# Patient Record
Sex: Female | Born: 1945
Health system: Southern US, Community
[De-identification: ages and names within clinical notes are randomized; demographics above are authoritative.]

## PROBLEM LIST (undated history)

## (undated) DIAGNOSIS — F32A Depression, unspecified: Secondary | ICD-10-CM

## (undated) DIAGNOSIS — I10 Essential (primary) hypertension: Secondary | ICD-10-CM

## (undated) DIAGNOSIS — K589 Irritable bowel syndrome without diarrhea: Secondary | ICD-10-CM

## (undated) DIAGNOSIS — G40909 Epilepsy, unspecified, not intractable, without status epilepticus: Secondary | ICD-10-CM

## (undated) DIAGNOSIS — K219 Gastro-esophageal reflux disease without esophagitis: Secondary | ICD-10-CM

## (undated) DIAGNOSIS — D649 Anemia, unspecified: Secondary | ICD-10-CM

## (undated) DIAGNOSIS — E039 Hypothyroidism, unspecified: Secondary | ICD-10-CM

## (undated) DIAGNOSIS — M109 Gout, unspecified: Secondary | ICD-10-CM

## (undated) DIAGNOSIS — K5520 Angiodysplasia of colon without hemorrhage: Secondary | ICD-10-CM

## (undated) DIAGNOSIS — K760 Fatty (change of) liver, not elsewhere classified: Secondary | ICD-10-CM

## (undated) DIAGNOSIS — M1990 Unspecified osteoarthritis, unspecified site: Secondary | ICD-10-CM

## (undated) DIAGNOSIS — R569 Unspecified convulsions: Secondary | ICD-10-CM

## (undated) DIAGNOSIS — I219 Acute myocardial infarction, unspecified: Secondary | ICD-10-CM

## (undated) DIAGNOSIS — H3581 Retinal edema: Secondary | ICD-10-CM

## (undated) DIAGNOSIS — G2581 Restless legs syndrome: Secondary | ICD-10-CM

## (undated) DIAGNOSIS — K635 Polyp of colon: Secondary | ICD-10-CM

## (undated) DIAGNOSIS — T7840XA Allergy, unspecified, initial encounter: Secondary | ICD-10-CM

## (undated) DIAGNOSIS — K56699 Other intestinal obstruction unspecified as to partial versus complete obstruction: Secondary | ICD-10-CM

## (undated) DIAGNOSIS — Z5189 Encounter for other specified aftercare: Secondary | ICD-10-CM

## (undated) DIAGNOSIS — E785 Hyperlipidemia, unspecified: Secondary | ICD-10-CM

## (undated) DIAGNOSIS — K222 Esophageal obstruction: Secondary | ICD-10-CM

## (undated) DIAGNOSIS — K5790 Diverticulosis of intestine, part unspecified, without perforation or abscess without bleeding: Secondary | ICD-10-CM

## (undated) DIAGNOSIS — R7303 Prediabetes: Secondary | ICD-10-CM

## (undated) DIAGNOSIS — N189 Chronic kidney disease, unspecified: Secondary | ICD-10-CM

## (undated) DIAGNOSIS — D689 Coagulation defect, unspecified: Secondary | ICD-10-CM

## (undated) DIAGNOSIS — C50919 Malignant neoplasm of unspecified site of unspecified female breast: Secondary | ICD-10-CM

## (undated) DIAGNOSIS — Z79890 Hormone replacement therapy (postmenopausal): Secondary | ICD-10-CM

## (undated) DIAGNOSIS — A029 Salmonella infection, unspecified: Secondary | ICD-10-CM

## (undated) DIAGNOSIS — Z780 Asymptomatic menopausal state: Secondary | ICD-10-CM

## (undated) DIAGNOSIS — E538 Deficiency of other specified B group vitamins: Secondary | ICD-10-CM

## (undated) DIAGNOSIS — H269 Unspecified cataract: Secondary | ICD-10-CM

## (undated) DIAGNOSIS — R011 Cardiac murmur, unspecified: Secondary | ICD-10-CM

## (undated) DIAGNOSIS — F419 Anxiety disorder, unspecified: Secondary | ICD-10-CM

## (undated) DIAGNOSIS — K922 Gastrointestinal hemorrhage, unspecified: Secondary | ICD-10-CM

## (undated) DIAGNOSIS — K449 Diaphragmatic hernia without obstruction or gangrene: Secondary | ICD-10-CM

## (undated) DIAGNOSIS — M170 Bilateral primary osteoarthritis of knee: Secondary | ICD-10-CM

## (undated) DIAGNOSIS — K921 Melena: Secondary | ICD-10-CM

## (undated) HISTORY — DX: Irritable bowel syndrome, unspecified: K58.9

## (undated) HISTORY — DX: Prediabetes: R73.03

## (undated) HISTORY — DX: Asymptomatic menopausal state: Z78.0

## (undated) HISTORY — DX: Essential (primary) hypertension: I10

## (undated) HISTORY — PX: TUBAL LIGATION: SHX77

## (undated) HISTORY — DX: Allergy, unspecified, initial encounter: T78.40XA

## (undated) HISTORY — DX: Epilepsy, unspecified, not intractable, without status epilepticus: G40.909

## (undated) HISTORY — DX: Polyp of colon: K63.5

## (undated) HISTORY — DX: Unspecified cataract: H26.9

## (undated) HISTORY — DX: Hormone replacement therapy: Z79.890

## (undated) HISTORY — DX: Coagulation defect, unspecified: D68.9

## (undated) HISTORY — DX: Encounter for other specified aftercare: Z51.89

## (undated) HISTORY — DX: Fatty (change of) liver, not elsewhere classified: K76.0

## (undated) HISTORY — DX: Angiodysplasia of colon without hemorrhage: K55.20

## (undated) HISTORY — DX: Anxiety disorder, unspecified: F41.9

## (undated) HISTORY — DX: Bilateral primary osteoarthritis of knee: M17.0

## (undated) HISTORY — DX: Chronic kidney disease, unspecified: N18.9

## (undated) HISTORY — DX: Hypothyroidism, unspecified: E03.9

## (undated) HISTORY — DX: Gastrointestinal hemorrhage, unspecified: K92.2

## (undated) HISTORY — DX: Deficiency of other specified B group vitamins: E53.8

## (undated) HISTORY — DX: Diverticulosis of intestine, part unspecified, without perforation or abscess without bleeding: K57.90

## (undated) HISTORY — DX: Unspecified osteoarthritis, unspecified site: M19.90

## (undated) HISTORY — DX: Other intestinal obstruction unspecified as to partial versus complete obstruction: K56.699

## (undated) HISTORY — DX: Hyperlipidemia, unspecified: E78.5

## (undated) HISTORY — DX: Gastro-esophageal reflux disease without esophagitis: K21.9

## (undated) HISTORY — DX: Acute myocardial infarction, unspecified: I21.9

## (undated) HISTORY — DX: Malignant neoplasm of unspecified site of unspecified female breast: C50.919

## (undated) HISTORY — DX: Diaphragmatic hernia without obstruction or gangrene: K44.9

## (undated) HISTORY — DX: Depression, unspecified: F32.A

## (undated) HISTORY — DX: Cardiac murmur, unspecified: R01.1

## (undated) HISTORY — PX: HERNIA REPAIR: SHX51

## (undated) HISTORY — DX: Restless legs syndrome: G25.81

## (undated) HISTORY — DX: Melena: K92.1

## (undated) HISTORY — DX: Salmonella infection, unspecified: A02.9

## (undated) HISTORY — PX: GANGLION CYST EXCISION: SHX1691

## (undated) HISTORY — DX: Retinal edema: H35.81

## (undated) HISTORY — DX: Gout, unspecified: M10.9

## (undated) HISTORY — DX: Esophageal obstruction: K22.2

## (undated) HISTORY — PX: COSMETIC SURGERY: SHX468

---

## 1981-03-20 HISTORY — PX: ABDOMINAL HYSTERECTOMY: SHX81

## 1984-03-20 HISTORY — PX: OVARY SURGERY: SHX727

## 2000-01-24 ENCOUNTER — Encounter: Payer: Self-pay | Admitting: Obstetrics and Gynecology

## 2000-01-24 ENCOUNTER — Encounter: Admission: RE | Admit: 2000-01-24 | Discharge: 2000-01-24 | Payer: Self-pay | Admitting: Obstetrics and Gynecology

## 2000-05-17 ENCOUNTER — Encounter: Payer: Self-pay | Admitting: Geriatric Medicine

## 2000-05-17 ENCOUNTER — Inpatient Hospital Stay (HOSPITAL_COMMUNITY): Admission: EM | Admit: 2000-05-17 | Discharge: 2000-05-22 | Payer: Self-pay | Admitting: Emergency Medicine

## 2000-05-18 DIAGNOSIS — A029 Salmonella infection, unspecified: Secondary | ICD-10-CM

## 2000-05-18 HISTORY — DX: Salmonella infection, unspecified: A02.9

## 2001-03-19 ENCOUNTER — Encounter: Admission: RE | Admit: 2001-03-19 | Discharge: 2001-03-19 | Payer: Self-pay | Admitting: Obstetrics and Gynecology

## 2001-03-19 ENCOUNTER — Encounter: Payer: Self-pay | Admitting: Obstetrics and Gynecology

## 2002-03-20 ENCOUNTER — Encounter: Payer: Self-pay | Admitting: Family Medicine

## 2002-03-20 LAB — CONVERTED CEMR LAB

## 2002-07-25 ENCOUNTER — Encounter: Admission: RE | Admit: 2002-07-25 | Discharge: 2002-07-25 | Payer: Self-pay | Admitting: Obstetrics and Gynecology

## 2002-07-25 ENCOUNTER — Encounter: Payer: Self-pay | Admitting: Obstetrics and Gynecology

## 2003-01-14 ENCOUNTER — Encounter: Payer: Self-pay | Admitting: Gastroenterology

## 2003-01-14 DIAGNOSIS — K56699 Other intestinal obstruction unspecified as to partial versus complete obstruction: Secondary | ICD-10-CM | POA: Insufficient documentation

## 2003-01-14 HISTORY — DX: Other intestinal obstruction unspecified as to partial versus complete obstruction: K56.699

## 2003-01-14 LAB — HM COLONOSCOPY

## 2003-01-20 ENCOUNTER — Encounter: Payer: Self-pay | Admitting: Gastroenterology

## 2003-01-20 ENCOUNTER — Ambulatory Visit (HOSPITAL_COMMUNITY): Admission: RE | Admit: 2003-01-20 | Discharge: 2003-01-20 | Payer: Self-pay | Admitting: Gastroenterology

## 2003-06-11 ENCOUNTER — Emergency Department (HOSPITAL_COMMUNITY): Admission: EM | Admit: 2003-06-11 | Discharge: 2003-06-11 | Payer: Self-pay | Admitting: Emergency Medicine

## 2004-01-19 ENCOUNTER — Ambulatory Visit: Payer: Self-pay | Admitting: Family Medicine

## 2004-01-29 ENCOUNTER — Encounter: Admission: RE | Admit: 2004-01-29 | Discharge: 2004-01-29 | Payer: Self-pay | Admitting: Family Medicine

## 2004-02-09 ENCOUNTER — Ambulatory Visit: Payer: Self-pay | Admitting: Family Medicine

## 2004-02-16 ENCOUNTER — Encounter: Admission: RE | Admit: 2004-02-16 | Discharge: 2004-02-21 | Payer: Self-pay | Admitting: Family Medicine

## 2004-03-10 ENCOUNTER — Ambulatory Visit: Payer: Self-pay | Admitting: Family Medicine

## 2004-07-08 ENCOUNTER — Ambulatory Visit: Payer: Self-pay | Admitting: Family Medicine

## 2004-10-19 ENCOUNTER — Emergency Department (HOSPITAL_COMMUNITY): Admission: EM | Admit: 2004-10-19 | Discharge: 2004-10-19 | Payer: Self-pay | Admitting: Emergency Medicine

## 2004-10-20 ENCOUNTER — Ambulatory Visit: Payer: Self-pay | Admitting: Family Medicine

## 2004-11-03 ENCOUNTER — Ambulatory Visit: Payer: Self-pay | Admitting: Family Medicine

## 2004-11-07 ENCOUNTER — Ambulatory Visit: Payer: Self-pay | Admitting: Family Medicine

## 2005-01-18 ENCOUNTER — Ambulatory Visit: Payer: Self-pay | Admitting: Family Medicine

## 2005-05-11 ENCOUNTER — Ambulatory Visit: Payer: Self-pay | Admitting: Family Medicine

## 2005-06-12 ENCOUNTER — Ambulatory Visit: Payer: Self-pay | Admitting: Family Medicine

## 2005-06-19 ENCOUNTER — Encounter: Admission: RE | Admit: 2005-06-19 | Discharge: 2005-06-19 | Payer: Self-pay | Admitting: Obstetrics and Gynecology

## 2005-06-27 ENCOUNTER — Ambulatory Visit: Payer: Self-pay | Admitting: Family Medicine

## 2005-07-04 ENCOUNTER — Encounter: Admission: RE | Admit: 2005-07-04 | Discharge: 2005-07-04 | Payer: Self-pay | Admitting: Obstetrics and Gynecology

## 2005-08-10 ENCOUNTER — Ambulatory Visit: Payer: Self-pay | Admitting: Family Medicine

## 2005-10-04 ENCOUNTER — Ambulatory Visit: Payer: Self-pay | Admitting: Family Medicine

## 2006-02-02 ENCOUNTER — Ambulatory Visit: Payer: Self-pay | Admitting: Family Medicine

## 2006-02-22 ENCOUNTER — Ambulatory Visit: Payer: Self-pay | Admitting: Family Medicine

## 2006-05-11 ENCOUNTER — Ambulatory Visit: Payer: Self-pay | Admitting: Family Medicine

## 2006-07-06 ENCOUNTER — Encounter: Admission: RE | Admit: 2006-07-06 | Discharge: 2006-07-06 | Payer: Self-pay | Admitting: Family Medicine

## 2006-08-10 ENCOUNTER — Ambulatory Visit: Payer: Self-pay | Admitting: Family Medicine

## 2006-08-22 ENCOUNTER — Encounter: Payer: Self-pay | Admitting: Family Medicine

## 2006-08-22 DIAGNOSIS — Z87898 Personal history of other specified conditions: Secondary | ICD-10-CM

## 2006-08-22 DIAGNOSIS — K760 Fatty (change of) liver, not elsewhere classified: Secondary | ICD-10-CM

## 2006-08-22 DIAGNOSIS — E1169 Type 2 diabetes mellitus with other specified complication: Secondary | ICD-10-CM

## 2006-08-22 DIAGNOSIS — K219 Gastro-esophageal reflux disease without esophagitis: Secondary | ICD-10-CM | POA: Insufficient documentation

## 2006-08-22 DIAGNOSIS — J309 Allergic rhinitis, unspecified: Secondary | ICD-10-CM

## 2006-08-22 DIAGNOSIS — E785 Hyperlipidemia, unspecified: Secondary | ICD-10-CM

## 2006-08-22 DIAGNOSIS — I10 Essential (primary) hypertension: Secondary | ICD-10-CM

## 2006-08-22 HISTORY — DX: Essential (primary) hypertension: I10

## 2006-08-22 HISTORY — DX: Allergic rhinitis, unspecified: J30.9

## 2006-08-22 HISTORY — DX: Personal history of other specified conditions: Z87.898

## 2006-08-22 HISTORY — DX: Type 2 diabetes mellitus with other specified complication: E11.69

## 2006-08-23 ENCOUNTER — Ambulatory Visit: Payer: Self-pay | Admitting: Family Medicine

## 2006-08-23 DIAGNOSIS — G478 Other sleep disorders: Secondary | ICD-10-CM | POA: Insufficient documentation

## 2006-08-23 DIAGNOSIS — G5600 Carpal tunnel syndrome, unspecified upper limb: Secondary | ICD-10-CM

## 2006-08-23 DIAGNOSIS — G2581 Restless legs syndrome: Secondary | ICD-10-CM

## 2006-08-23 HISTORY — DX: Restless legs syndrome: G25.81

## 2006-08-23 HISTORY — DX: Carpal tunnel syndrome, unspecified upper limb: G56.00

## 2006-08-24 LAB — CONVERTED CEMR LAB
ALT: 55 units/L — ABNORMAL HIGH (ref 0–40)
AST: 59 units/L — ABNORMAL HIGH (ref 0–37)
Basophils Absolute: 0.1 10*3/uL (ref 0.0–0.1)
Basophils Relative: 0.8 % (ref 0.0–1.0)
HCT: 37.7 % (ref 36.0–46.0)
Hemoglobin: 13.1 g/dL (ref 12.0–15.0)
Lymphocytes Relative: 28.2 % (ref 12.0–46.0)
MCHC: 34.7 g/dL (ref 30.0–36.0)
Monocytes Absolute: 0.5 10*3/uL (ref 0.2–0.7)
Monocytes Relative: 7.5 % (ref 3.0–11.0)
WBC: 6.6 10*3/uL (ref 4.5–10.5)

## 2006-10-29 ENCOUNTER — Telehealth (INDEPENDENT_AMBULATORY_CARE_PROVIDER_SITE_OTHER): Payer: Self-pay | Admitting: *Deleted

## 2006-11-30 ENCOUNTER — Ambulatory Visit: Payer: Self-pay | Admitting: Family Medicine

## 2007-04-15 ENCOUNTER — Ambulatory Visit: Payer: Self-pay | Admitting: Family Medicine

## 2007-04-15 DIAGNOSIS — F411 Generalized anxiety disorder: Secondary | ICD-10-CM

## 2007-04-15 HISTORY — DX: Generalized anxiety disorder: F41.1

## 2007-04-30 ENCOUNTER — Telehealth: Payer: Self-pay | Admitting: Family Medicine

## 2007-05-02 ENCOUNTER — Ambulatory Visit: Payer: Self-pay | Admitting: Family Medicine

## 2007-05-06 LAB — CONVERTED CEMR LAB
ALT: 29 units/L (ref 0–35)
Albumin: 3.7 g/dL (ref 3.5–5.2)
Alkaline Phosphatase: 80 units/L (ref 39–117)
BUN: 20 mg/dL (ref 6–23)
CO2: 27 meq/L (ref 19–32)
Calcium: 9.2 mg/dL (ref 8.4–10.5)
Chloride: 104 meq/L (ref 96–112)
Direct LDL: 113.2 mg/dL
GFR calc Af Amer: 82 mL/min
HDL: 25.7 mg/dL — ABNORMAL LOW (ref >39.0)
Phosphorus: 4.4 mg/dL (ref 2.3–4.6)
Potassium: 4.2 meq/L (ref 3.5–5.1)
Sodium: 140 meq/L (ref 135–145)
VLDL: 45 mg/dL — ABNORMAL HIGH (ref 0–40)

## 2007-05-17 ENCOUNTER — Ambulatory Visit: Payer: Self-pay | Admitting: Family Medicine

## 2007-05-18 ENCOUNTER — Encounter: Payer: Self-pay | Admitting: Family Medicine

## 2007-06-11 ENCOUNTER — Encounter: Payer: Self-pay | Admitting: Family Medicine

## 2007-08-16 ENCOUNTER — Ambulatory Visit: Payer: Self-pay | Admitting: Family Medicine

## 2007-08-18 LAB — CONVERTED CEMR LAB
Hgb A1c MFr Bld: 6.4 % — ABNORMAL HIGH (ref 4.6–6.0)
Triglycerides: 283 mg/dL (ref 0–149)
VLDL: 57 mg/dL — ABNORMAL HIGH (ref 0–40)

## 2007-08-19 ENCOUNTER — Ambulatory Visit: Payer: Self-pay | Admitting: Family Medicine

## 2007-08-19 DIAGNOSIS — E1140 Type 2 diabetes mellitus with diabetic neuropathy, unspecified: Secondary | ICD-10-CM

## 2007-08-19 DIAGNOSIS — E1165 Type 2 diabetes mellitus with hyperglycemia: Secondary | ICD-10-CM

## 2007-08-19 HISTORY — DX: Type 2 diabetes mellitus with diabetic neuropathy, unspecified: E11.40

## 2007-09-26 ENCOUNTER — Telehealth: Payer: Self-pay | Admitting: Family Medicine

## 2007-10-03 ENCOUNTER — Telehealth (INDEPENDENT_AMBULATORY_CARE_PROVIDER_SITE_OTHER): Payer: Self-pay | Admitting: *Deleted

## 2007-11-11 ENCOUNTER — Telehealth (INDEPENDENT_AMBULATORY_CARE_PROVIDER_SITE_OTHER): Payer: Self-pay | Admitting: *Deleted

## 2007-11-29 ENCOUNTER — Ambulatory Visit: Payer: Self-pay | Admitting: Family Medicine

## 2007-11-29 ENCOUNTER — Encounter: Admission: RE | Admit: 2007-11-29 | Discharge: 2007-11-29 | Payer: Self-pay | Admitting: Family Medicine

## 2007-11-29 LAB — CONVERTED CEMR LAB
ALT: 31 units/L (ref 0–35)
Albumin: 4.1 g/dL (ref 3.5–5.2)
CO2: 31 meq/L (ref 19–32)
Calcium: 9.2 mg/dL (ref 8.4–10.5)
Chloride: 104 meq/L (ref 96–112)
Direct LDL: 106.7 mg/dL
Glucose, Bld: 105 mg/dL — ABNORMAL HIGH (ref 70–99)
Hgb A1c MFr Bld: 6 % (ref 4.6–6.0)
Microalb Creat Ratio: 3.1 mg/g (ref 0.0–30.0)
Microalb, Ur: 0.5 mg/dL (ref 0.0–1.9)
Phosphorus: 4.8 mg/dL — ABNORMAL HIGH (ref 2.3–4.6)
Total CHOL/HDL Ratio: 7
Triglycerides: 271 mg/dL (ref 0–149)
VLDL: 54 mg/dL — ABNORMAL HIGH (ref 0–40)

## 2007-12-02 ENCOUNTER — Ambulatory Visit: Payer: Self-pay | Admitting: Family Medicine

## 2007-12-30 ENCOUNTER — Telehealth: Payer: Self-pay | Admitting: Family Medicine

## 2008-01-13 ENCOUNTER — Telehealth: Payer: Self-pay | Admitting: Family Medicine

## 2008-01-19 DIAGNOSIS — K921 Melena: Secondary | ICD-10-CM | POA: Insufficient documentation

## 2008-01-19 HISTORY — DX: Melena: K92.1

## 2008-02-03 ENCOUNTER — Encounter: Payer: Self-pay | Admitting: Family Medicine

## 2008-02-03 ENCOUNTER — Ambulatory Visit: Payer: Self-pay | Admitting: Internal Medicine

## 2008-02-03 ENCOUNTER — Inpatient Hospital Stay (HOSPITAL_COMMUNITY): Admission: EM | Admit: 2008-02-03 | Discharge: 2008-02-05 | Payer: Self-pay | Admitting: Emergency Medicine

## 2008-02-04 ENCOUNTER — Ambulatory Visit: Payer: Self-pay | Admitting: Gastroenterology

## 2008-02-04 DIAGNOSIS — K222 Esophageal obstruction: Secondary | ICD-10-CM

## 2008-02-04 HISTORY — DX: Esophageal obstruction: K22.2

## 2008-02-05 ENCOUNTER — Encounter: Payer: Self-pay | Admitting: Family Medicine

## 2008-02-07 ENCOUNTER — Ambulatory Visit: Payer: Self-pay | Admitting: Family Medicine

## 2008-02-07 DIAGNOSIS — D649 Anemia, unspecified: Secondary | ICD-10-CM | POA: Insufficient documentation

## 2008-02-10 ENCOUNTER — Ambulatory Visit: Payer: Self-pay | Admitting: Family Medicine

## 2008-02-10 LAB — CONVERTED CEMR LAB
HCT: 35 % — ABNORMAL LOW (ref 36.0–46.0)
Hemoglobin: 12 g/dL (ref 12.0–15.0)

## 2008-03-02 DIAGNOSIS — K449 Diaphragmatic hernia without obstruction or gangrene: Secondary | ICD-10-CM

## 2008-03-02 DIAGNOSIS — K222 Esophageal obstruction: Secondary | ICD-10-CM

## 2008-03-02 DIAGNOSIS — K660 Peritoneal adhesions (postprocedural) (postinfection): Secondary | ICD-10-CM

## 2008-03-02 HISTORY — DX: Esophageal obstruction: K22.2

## 2008-03-02 HISTORY — DX: Peritoneal adhesions (postprocedural) (postinfection): K66.0

## 2008-03-02 HISTORY — DX: Diaphragmatic hernia without obstruction or gangrene: K44.9

## 2008-03-03 ENCOUNTER — Ambulatory Visit: Payer: Self-pay | Admitting: Gastroenterology

## 2008-03-03 DIAGNOSIS — K5730 Diverticulosis of large intestine without perforation or abscess without bleeding: Secondary | ICD-10-CM | POA: Insufficient documentation

## 2008-04-13 ENCOUNTER — Ambulatory Visit: Payer: Self-pay | Admitting: Family Medicine

## 2008-04-13 LAB — CONVERTED CEMR LAB: Rapid Strep: NEGATIVE

## 2008-05-26 ENCOUNTER — Telehealth: Payer: Self-pay | Admitting: Family Medicine

## 2008-06-08 ENCOUNTER — Ambulatory Visit: Payer: Self-pay | Admitting: Family Medicine

## 2008-06-08 LAB — CONVERTED CEMR LAB
AST: 51 units/L — ABNORMAL HIGH (ref 0–37)
Creatinine, Ser: 0.8 mg/dL (ref 0.4–1.2)
Direct LDL: 125.1 mg/dL
Glucose, Bld: 105 mg/dL — ABNORMAL HIGH (ref 70–99)
Potassium: 4.3 meq/L (ref 3.5–5.1)
Sodium: 145 meq/L (ref 135–145)

## 2008-06-10 ENCOUNTER — Ambulatory Visit: Payer: Self-pay | Admitting: Family Medicine

## 2008-06-24 ENCOUNTER — Encounter: Payer: Self-pay | Admitting: Family Medicine

## 2008-06-25 ENCOUNTER — Telehealth: Payer: Self-pay | Admitting: Family Medicine

## 2008-09-14 ENCOUNTER — Ambulatory Visit: Payer: Self-pay | Admitting: Family Medicine

## 2008-09-15 LAB — CONVERTED CEMR LAB
Albumin: 4.1 g/dL (ref 3.5–5.2)
Basophils Relative: 0.5 % (ref 0.0–3.0)
Calcium: 9.2 mg/dL (ref 8.4–10.5)
Chloride: 104 meq/L (ref 96–112)
Cholesterol: 216 mg/dL — ABNORMAL HIGH (ref 0–200)
Eosinophils Absolute: 0.1 10*3/uL (ref 0.0–0.7)
Eosinophils Relative: 1.1 % (ref 0.0–5.0)
Glucose, Bld: 114 mg/dL — ABNORMAL HIGH (ref 70–99)
HCT: 35.6 % — ABNORMAL LOW (ref 36.0–46.0)
Hemoglobin: 12.1 g/dL (ref 12.0–15.0)
MCHC: 33.9 g/dL (ref 30.0–36.0)
MCV: 83.8 fL (ref 78.0–100.0)
Microalb, Ur: 0.8 mg/dL (ref 0.0–1.9)
Monocytes Absolute: 0.4 10*3/uL (ref 0.1–1.0)
Neutro Abs: 3.9 10*3/uL (ref 1.4–7.7)
Phosphorus: 4.8 mg/dL — ABNORMAL HIGH (ref 2.3–4.6)
Potassium: 4.2 meq/L (ref 3.5–5.1)
RBC: 4.25 M/uL (ref 3.87–5.11)
Sodium: 143 meq/L (ref 135–145)
Triglycerides: 306 mg/dL — ABNORMAL HIGH (ref 0.0–149.0)
WBC: 6.2 10*3/uL (ref 4.5–10.5)

## 2008-09-16 ENCOUNTER — Ambulatory Visit: Payer: Self-pay | Admitting: Family Medicine

## 2008-10-16 ENCOUNTER — Ambulatory Visit: Payer: Self-pay | Admitting: Family Medicine

## 2008-10-20 LAB — CONVERTED CEMR LAB
ALT: 51 units/L — ABNORMAL HIGH (ref 0–35)
Cholesterol: 161 mg/dL (ref 0–200)
HDL: 33.1 mg/dL — ABNORMAL LOW (ref >39.00)
Total CHOL/HDL Ratio: 5
Triglycerides: 232 mg/dL — ABNORMAL HIGH (ref 0.0–149.0)

## 2008-12-25 ENCOUNTER — Telehealth: Payer: Self-pay | Admitting: Family Medicine

## 2008-12-28 ENCOUNTER — Telehealth: Payer: Self-pay | Admitting: Family Medicine

## 2008-12-31 LAB — HM DIABETES EYE EXAM: HM Diabetic Eye Exam: NORMAL

## 2009-01-05 ENCOUNTER — Telehealth: Payer: Self-pay | Admitting: Family Medicine

## 2009-01-21 ENCOUNTER — Ambulatory Visit: Payer: Self-pay | Admitting: Family Medicine

## 2009-01-21 LAB — CONVERTED CEMR LAB
ALT: 48 units/L — ABNORMAL HIGH (ref 0–35)
Cholesterol: 169 mg/dL (ref 0–200)
Total CHOL/HDL Ratio: 5
Triglycerides: 227 mg/dL — ABNORMAL HIGH (ref 0.0–149.0)
VLDL: 45.4 mg/dL — ABNORMAL HIGH (ref 0.0–40.0)

## 2009-01-22 ENCOUNTER — Ambulatory Visit: Payer: Self-pay | Admitting: Family Medicine

## 2009-01-29 ENCOUNTER — Encounter: Admission: RE | Admit: 2009-01-29 | Discharge: 2009-01-29 | Payer: Self-pay | Admitting: Family Medicine

## 2009-02-08 ENCOUNTER — Encounter: Payer: Self-pay | Admitting: Family Medicine

## 2009-02-09 ENCOUNTER — Encounter: Admission: RE | Admit: 2009-02-09 | Discharge: 2009-02-09 | Payer: Self-pay | Admitting: Family Medicine

## 2009-02-16 ENCOUNTER — Encounter: Admission: RE | Admit: 2009-02-16 | Discharge: 2009-02-16 | Payer: Self-pay | Admitting: Family Medicine

## 2009-02-17 DIAGNOSIS — C50919 Malignant neoplasm of unspecified site of unspecified female breast: Secondary | ICD-10-CM | POA: Insufficient documentation

## 2009-02-17 HISTORY — PX: BREAST SURGERY: SHX581

## 2009-02-17 HISTORY — DX: Malignant neoplasm of unspecified site of unspecified female breast: C50.919

## 2009-02-19 ENCOUNTER — Encounter: Payer: Self-pay | Admitting: Family Medicine

## 2009-02-23 ENCOUNTER — Ambulatory Visit: Payer: Self-pay | Admitting: Genetic Counselor

## 2009-03-04 ENCOUNTER — Encounter: Admission: RE | Admit: 2009-03-04 | Discharge: 2009-03-04 | Payer: Self-pay | Admitting: Family Medicine

## 2009-03-10 ENCOUNTER — Encounter: Admission: RE | Admit: 2009-03-10 | Discharge: 2009-03-10 | Payer: Self-pay | Admitting: Family Medicine

## 2009-03-18 ENCOUNTER — Telehealth: Payer: Self-pay | Admitting: Family Medicine

## 2009-03-22 ENCOUNTER — Encounter: Admission: RE | Admit: 2009-03-22 | Discharge: 2009-03-22 | Payer: Self-pay | Admitting: Family Medicine

## 2009-03-22 ENCOUNTER — Encounter: Payer: Self-pay | Admitting: Family Medicine

## 2009-03-23 ENCOUNTER — Telehealth: Payer: Self-pay | Admitting: Family Medicine

## 2009-03-23 ENCOUNTER — Encounter: Admission: RE | Admit: 2009-03-23 | Discharge: 2009-03-23 | Payer: Self-pay | Admitting: Family Medicine

## 2009-03-23 LAB — HM MAMMOGRAPHY

## 2009-03-26 ENCOUNTER — Encounter: Payer: Self-pay | Admitting: Family Medicine

## 2009-04-20 HISTORY — PX: MASTECTOMY: SHX3

## 2009-05-03 ENCOUNTER — Telehealth: Payer: Self-pay | Admitting: Family Medicine

## 2009-05-11 ENCOUNTER — Encounter: Payer: Self-pay | Admitting: Family Medicine

## 2009-05-11 ENCOUNTER — Ambulatory Visit (HOSPITAL_COMMUNITY): Admission: RE | Admit: 2009-05-11 | Discharge: 2009-05-15 | Payer: Self-pay | Admitting: Surgery

## 2009-05-11 ENCOUNTER — Encounter (INDEPENDENT_AMBULATORY_CARE_PROVIDER_SITE_OTHER): Payer: Self-pay | Admitting: Surgery

## 2009-05-17 ENCOUNTER — Telehealth: Payer: Self-pay | Admitting: Family Medicine

## 2009-06-30 ENCOUNTER — Ambulatory Visit: Payer: Self-pay | Admitting: Oncology

## 2009-07-01 ENCOUNTER — Ambulatory Visit: Payer: Self-pay | Admitting: Oncology

## 2009-07-15 ENCOUNTER — Encounter: Payer: Self-pay | Admitting: Family Medicine

## 2009-07-15 LAB — CBC WITH DIFFERENTIAL (CANCER CENTER ONLY)
BASO#: 0 10*3/uL (ref 0.0–0.2)
EOS%: 1.4 % (ref 0.0–7.0)
Eosinophils Absolute: 0.1 10*3/uL (ref 0.0–0.5)
LYMPH%: 31.5 % (ref 14.0–48.0)
MCH: 24.4 pg — ABNORMAL LOW (ref 26.0–34.0)
MCHC: 33 g/dL (ref 32.0–36.0)
MCV: 74 fL — ABNORMAL LOW (ref 81–101)
MONO%: 7.2 % (ref 0.0–13.0)
Platelets: 208 10*3/uL (ref 145–400)
RBC: 4.29 10*6/uL (ref 3.70–5.32)

## 2009-07-15 LAB — CMP (CANCER CENTER ONLY)
AST: 73 U/L — ABNORMAL HIGH (ref 11–38)
Albumin: 3.6 g/dL (ref 3.3–5.5)
Alkaline Phosphatase: 106 U/L — ABNORMAL HIGH (ref 26–84)
BUN, Bld: 13 mg/dL (ref 7–22)
Calcium: 9.4 mg/dL (ref 8.0–10.3)
Glucose, Bld: 202 mg/dL — ABNORMAL HIGH (ref 73–118)
Sodium: 135 mEq/L (ref 128–145)
Total Bilirubin: 0.5 mg/dl (ref 0.20–1.60)

## 2009-07-16 LAB — CANCER ANTIGEN 27.29: CA 27.29: 19 U/mL (ref 0–39)

## 2009-07-16 LAB — IRON AND TIBC: UIBC: 425 ug/dL

## 2009-07-23 ENCOUNTER — Ambulatory Visit: Payer: Self-pay | Admitting: Family Medicine

## 2009-07-25 LAB — CONVERTED CEMR LAB
ALT: 45 units/L — ABNORMAL HIGH (ref 0–35)
AST: 66 units/L — ABNORMAL HIGH (ref 0–37)
BUN: 18 mg/dL (ref 6–23)
Chloride: 100 meq/L (ref 96–112)
Creatinine, Ser: 0.7 mg/dL (ref 0.4–1.2)
Creatinine,U: 121.8 mg/dL
HDL: 31.9 mg/dL — ABNORMAL LOW (ref >39.00)
Hgb A1c MFr Bld: 6.6 % — ABNORMAL HIGH (ref 4.6–6.5)
Phosphorus: 4.6 mg/dL (ref 2.3–4.6)
Potassium: 4.7 meq/L (ref 3.5–5.1)
Sodium: 141 meq/L (ref 135–145)
Total CHOL/HDL Ratio: 6
VLDL: 71.4 mg/dL — ABNORMAL HIGH (ref 0.0–40.0)

## 2009-08-02 ENCOUNTER — Ambulatory Visit: Payer: Self-pay | Admitting: Family Medicine

## 2009-08-02 DIAGNOSIS — C50911 Malignant neoplasm of unspecified site of right female breast: Secondary | ICD-10-CM

## 2009-08-02 DIAGNOSIS — Z853 Personal history of malignant neoplasm of breast: Secondary | ICD-10-CM

## 2009-08-02 HISTORY — DX: Personal history of malignant neoplasm of breast: Z85.3

## 2009-08-06 ENCOUNTER — Ambulatory Visit: Payer: Self-pay | Admitting: Oncology

## 2009-08-11 ENCOUNTER — Encounter: Payer: Self-pay | Admitting: Family Medicine

## 2009-08-12 ENCOUNTER — Encounter: Admission: RE | Admit: 2009-08-12 | Discharge: 2009-08-12 | Payer: Self-pay | Admitting: Oncology

## 2009-10-12 ENCOUNTER — Ambulatory Visit: Payer: Self-pay | Admitting: Oncology

## 2009-10-14 ENCOUNTER — Encounter: Payer: Self-pay | Admitting: Family Medicine

## 2009-10-14 LAB — CBC WITH DIFFERENTIAL/PLATELET
Basophils Absolute: 0 10*3/uL (ref 0.0–0.1)
EOS%: 0.7 % (ref 0.0–7.0)
Eosinophils Absolute: 0 10*3/uL (ref 0.0–0.5)
HCT: 37.4 % (ref 34.8–46.6)
HGB: 12.8 g/dL (ref 11.6–15.9)
MCH: 28.1 pg (ref 25.1–34.0)
MCV: 82.2 fL (ref 79.5–101.0)
MONO%: 6.1 % (ref 0.0–14.0)
NEUT#: 4.3 10*3/uL (ref 1.5–6.5)
NEUT%: 69.8 % (ref 38.4–76.8)
Platelets: 186 10*3/uL (ref 145–400)
RDW: 18.3 % — ABNORMAL HIGH (ref 11.2–14.5)

## 2009-10-14 LAB — CMP (CANCER CENTER ONLY)
AST: 98 U/L — ABNORMAL HIGH (ref 11–38)
Albumin: 4.2 g/dL (ref 3.3–5.5)
Alkaline Phosphatase: 75 U/L (ref 26–84)
BUN, Bld: 16 mg/dL (ref 7–22)
Creat: 0.8 mg/dl (ref 0.6–1.2)
Glucose, Bld: 160 mg/dL — ABNORMAL HIGH (ref 73–118)
Potassium: 3.7 mEq/L (ref 3.3–4.7)
Total Bilirubin: 0.5 mg/dl (ref 0.20–1.60)

## 2009-11-08 ENCOUNTER — Ambulatory Visit: Payer: Self-pay | Admitting: Family Medicine

## 2009-11-08 LAB — CONVERTED CEMR LAB
ALT: 50 units/L — ABNORMAL HIGH (ref 0–35)
Albumin: 4.3 g/dL (ref 3.5–5.2)
CO2: 31 meq/L (ref 19–32)
Calcium: 9.7 mg/dL (ref 8.4–10.5)
Chloride: 99 meq/L (ref 96–112)
HDL: 31 mg/dL — ABNORMAL LOW (ref >39.00)
Phosphorus: 4.2 mg/dL (ref 2.3–4.6)
Potassium: 4.4 meq/L (ref 3.5–5.1)
Triglycerides: 338 mg/dL — ABNORMAL HIGH (ref 0.0–149.0)
VLDL: 67.6 mg/dL — ABNORMAL HIGH (ref 0.0–40.0)

## 2009-11-11 ENCOUNTER — Ambulatory Visit: Payer: Self-pay | Admitting: Family Medicine

## 2009-11-11 DIAGNOSIS — R7401 Elevation of levels of liver transaminase levels: Secondary | ICD-10-CM

## 2009-11-11 DIAGNOSIS — R740 Nonspecific elevation of levels of transaminase and lactic acid dehydrogenase [LDH]: Secondary | ICD-10-CM

## 2009-11-11 HISTORY — DX: Elevation of levels of liver transaminase levels: R74.01

## 2009-11-16 ENCOUNTER — Encounter: Admission: RE | Admit: 2009-11-16 | Discharge: 2009-11-16 | Payer: Self-pay | Admitting: Family Medicine

## 2010-01-26 ENCOUNTER — Telehealth: Payer: Self-pay | Admitting: Family Medicine

## 2010-04-10 ENCOUNTER — Encounter: Payer: Self-pay | Admitting: Family Medicine

## 2010-04-15 ENCOUNTER — Ambulatory Visit: Payer: Self-pay | Admitting: Oncology

## 2010-04-19 ENCOUNTER — Encounter: Payer: Self-pay | Admitting: Family Medicine

## 2010-04-19 LAB — COMPREHENSIVE METABOLIC PANEL
ALT: 61 U/L — ABNORMAL HIGH (ref 0–35)
Alkaline Phosphatase: 92 U/L (ref 39–117)
CO2: 22 mEq/L (ref 19–32)
Sodium: 137 mEq/L (ref 135–145)
Total Bilirubin: 0.4 mg/dL (ref 0.3–1.2)
Total Protein: 8.2 g/dL (ref 6.0–8.3)

## 2010-04-19 LAB — CBC WITH DIFFERENTIAL/PLATELET
BASO%: 0.8 % (ref 0.0–2.0)
LYMPH%: 27.1 % (ref 14.0–49.7)
MCHC: 33.6 g/dL (ref 31.5–36.0)
MONO#: 0.5 10*3/uL (ref 0.1–0.9)
Platelets: 184 10*3/uL (ref 145–400)
RBC: 5.09 10*6/uL (ref 3.70–5.45)
WBC: 7.1 10*3/uL (ref 3.9–10.3)

## 2010-04-19 NOTE — Letter (Signed)
[facility]   Imported By: Lester Deerwood 09/04/2009 08:23:35  _____________________________________________________________________  External Attachment:    Type:   Image     Comment:   External Document

## 2010-04-19 NOTE — Progress Notes (Signed)
Summary: Surgical pathology report  Phone Note Other Incoming   Caller: Report from [facility]  of Surgical pathology dated 05/11/2009 Summary of Call: On your shelf is report from surgical pahology collected on 05/11/2009. Initial call taken by: Lewanda Rife LPN,  May 17, 2009 11:42 AM  Follow-up for Phone Call        thank you- please scan this  Follow-up by: Judith Part MD,  May 17, 2009 1:28 PM  Additional Follow-up for Phone Call Additional follow up Details #1::        Put in scanning folder at [facility] desk.Lewanda Rife LPN  May 17, 2009 5:23 PM       Past Surgical History:    Hysterectomy (1983)    Hosp- salmonella, renal effects secondary to dehydration (05/2000)    EGD- hiatal hernia    Colonoscopy- stricture (01/2003)    EGD- GERD, HH, stricture (01/2003)    ABD [place]- fatty liver (01/2004)    MRI- small vessel change (11/2004)    EEG- had to repeat, repeat negative (11/2004)    11/09 admit hosp - melena, anemia (transfusion)- GI bleed presumed upper    (11/09) EGD- esoph stricture     ovarian tumor removed -1986    12/10 breast biopsy- invasive ductal carcinoma    2/11 - bilateral mastectomys for ductal carcinoma on L

## 2010-04-19 NOTE — Progress Notes (Signed)
Summary: Ropinirole  Phone Note Refill Request Message from:  [employer] on January 26, 2010 11:19 AM  Refills Requested: Medication #1:  REQUIP 1 MG  TABS 1 by mouth at bedtime [facility]*   Last Fill Date:  No date sent   Pharmacy Phone:  [phone]   Method Requested: Electronic Initial call taken by: Delilah Shan CMA Duncan Dull),  January 26, 2010 11:19 AM  Follow-up for Phone Call        px written on EMR for call in/elect tramsmission  Follow-up by: Judith Part MD,  January 26, 2010 11:28 AM    New/Updated Medications: REQUIP 1 MG  TABS (ROPINIROLE HCL) 1 by mouth at bedtime Prescriptions: REQUIP 1 MG  TABS (ROPINIROLE HCL) 1 by mouth at bedtime  #90 x 3   Entered by:   Delilah Shan CMA (AAMA)   Authorized by:   Judith Part MD   Signed by:   Delilah Shan CMA (AAMA) on 01/26/2010   Method used:   Faxed to ...       [facility] (mail-order)             , [place]         Ph: [phone]       Fax: [phone]   RxID:   [id number]

## 2010-04-19 NOTE — Assessment & Plan Note (Signed)
Summary: F/U AFTER LABS / LFW   Vital Signs:  Patient profile:   65 year old female Height:      64.5 inches Weight:      164 pounds BMI:     27.82 Temp:     98.1 degrees F oral Pulse rate:   84 / minute Pulse rhythm:   regular BP sitting:   126 / 82  (left arm) Cuff size:   regular  Vitals Entered By: Lewanda Rife LPN (November 11, 2009 8:46 AM) CC: three month f/u   History of Present Illness: here for f/u of DM and lipids and HTN   thinks she has been going pretty well  had to go to [place] several times- and took her grandkids - separately- was more like vacation   she bought a horse -- bought a farm down in [place]  has not ridden it yet- getting a trainer  busy summer  she really enjoys him   wt is dowm 3 lb  gluc is 118 fasting and AIC up to 6.9 from 6.6 is doing better overall with diet - better when husband is gone  is eating healthier and that is good  is going to the gym on a regular basis (was going now and then) swam a lot this summer   fatty liver ast/alt 50/66   lipids with trig 338 and HDL 31 and LDL 136- not imp she is on max statin tolerated and zetia currrently  on arimidex for b ca after bilat mastectomies  some hot flashes that are bearable and some aches and pains -- is tolerating it  has another month of injections -- then reconstruction surgery     Allergies: 1)  Penicillin G Potassium (Penicillin G Potassium)  Past History:  Past Medical History: Last updated: 08/02/2009 Allergic rhinitis Anxiety GERD Hypertension Seizure disorder Hyperlipidemia RLS Arthritis Diverticulosis GI Bleed cataracts  breast cancer 12/10 L invasive ductal carcinoma (hormone receptor positive )   surgeon-  opthyNile Riggs   Past Surgical History: Last updated: 05/17/2009 Hysterectomy (1983) Hosp- salmonella, renal effects secondary to dehydration (05/2000) EGD- hiatal hernia Colonoscopy- stricture (01/2003) EGD- GERD, HH, stricture  (01/2003) ABD [place]- fatty liver (01/2004) MRI- small vessel change (11/2004) EEG- had to repeat, repeat negative (11/2004) 11/09 admit hosp - melena, anemia (transfusion)- GI bleed presumed upper (11/09) EGD- esoph stricture  ovarian tumor removed -1986 12/10 breast biopsy- invasive ductal carcinoma 2/11 - bilateral mastectomys for ductal carcinoma on L   Family History: Last updated: 03-29-2008 Father: deceased age 57- heart disease, MI Mother:  Siblings: 1 brother- HTN.  3 sisters, 2 with breast cancer No FH of Colon Cancer: Family History of Esophageal Cancer: Father Family History of Diabetes: Father, sister  Social History: Last updated: 03-29-2008 Marital Status  married Children: 3 Occupation: retired non smoker -never move back and forth between 2 residences up [place] and here due to [employer] job-- is very stressful Alcohol Use - no Daily Caffeine Use Illicit Drug Use - no Patient does not get regular exercise.   Risk Factors: Caffeine Use: 2 (03/29/2008) Exercise: no (2008/03/29)  Risk Factors: Smoking Status: never (08/22/2006)  Review of Systems General:  Denies fatigue, loss of appetite, and malaise. Eyes:  Denies blurring and eye irritation. CV:  Denies chest pain or discomfort and lightheadness. Resp:  Denies cough, pleuritic, and shortness of breath. GI:  Denies abdominal pain, change in bowel habits, indigestion, nausea, and vomiting. GU:  Denies urinary frequency. MS:  Denies  muscle aches, cramps, muscle weakness, and stiffness. Derm:  Denies itching, lesion(s), poor wound healing, and rash. Neuro:  Denies numbness and tingling. Psych:  Denies anxiety and depression; has kept a good attitude. Endo:  Denies cold intolerance, excessive thirst, excessive urination, and heat intolerance. Heme:  Denies abnormal bruising and bleeding.  Physical Exam  General:  Well-developed,well-nourished,in no acute distress; alert,appropriate and cooperative throughout  examination Head:  normocephalic, atraumatic, and no abnormalities observed.   Eyes:  vision grossly intact, pupils equal, pupils round, and pupils reactive to light.  no conjunctival pallor, injection or icterus  Mouth:  pharynx pink and moist.   Neck:  supple with full rom and no masses or thyromegally, no JVD or carotid bruit  Lungs:  Normal respiratory effort, chest expands symmetrically. Lungs are clear to auscultation, no crackles or wheezes. Heart:  Normal rate and regular rhythm. S1 and S2 normal without gallop, murmur, click, rub or other extra sounds. Abdomen:  Bowel sounds positive,abdomen soft and non-tender without masses, organomegaly or hernias noted. no renal bruits  Msk:  No deformity or scoliosis noted of thoracic or lumbar spine.   Pulses:  R and L carotid,radial,femoral,dorsalis pedis and posterior tibial pulses are full and equal bilaterally Extremities:  No clubbing, cyanosis, edema, or deformity noted with normal full range of motion of all joints.   Neurologic:  sensation intact to light touch and gait normal.   Skin:  Intact without suspicious lesions or rashes Cervical Nodes:  No lymphadenopathy noted Psych:  normal affect, talkative and pleasant   Diabetes Management Exam:    Foot Exam (with socks and/or shoes not present):       Sensory-Pinprick/Light touch:          Left medial foot (L-4): normal          Left dorsal foot (L-5): normal          Left lateral foot (S-1): normal          Right medial foot (L-4): normal          Right dorsal foot (L-5): normal          Right lateral foot (S-1): normal       Sensory-Monofilament:          Left foot: normal          Right foot: normal       Inspection:          Left foot: normal          Right foot: normal       Nails:          Left foot: normal          Right foot: normal   Impression & Recommendations:  Problem # 1:  TRANSAMINASES, SERUM, ELEVATED (ICD-790.4) Assessment Deteriorated ongoing suspect  from fatty liver- but in light of recent b ca will check [place] disc imp of low fat diet Orders: Radiology Referral (Radiology)  Problem # 2:  DIABETES MELLITUS, TYPE II (ICD-250.00) this is a bit worse-- if worsens again will start metformin overall good diet and exercise stressed imp of wt loss re check 3 mo and update on ace Her updated medication list for this problem includes:    Vasotec 20 Mg Tabs (Enalapril maleate) .Marland Kitchen... Take one by mouth once a day  Problem # 3:  HYPERLIPIDEMIA (ICD-272.4) Assessment: Unchanged  this is stable on max tol dose of meds rev low sat fat diet in detail trig may be  up due to DM lab and f/u 3 mo  Her updated medication list for this problem includes:    Zetia 10 Mg Tabs (Ezetimibe) .Marland Kitchen... Take 1 tablet by mouth once a day    Zocor 10 Mg Tabs (Simvastatin) .Marland Kitchen... 1/2 by mouth once daily  Labs Reviewed: SGOT: 66 (11/08/2009)   SGPT: 50 (11/08/2009)   HDL:31.00 (11/08/2009), 31.90 (07/23/2009)  LDL:DEL (11/29/2007), DEL (08/16/2007)  Chol:200 (11/08/2009), 203 (07/23/2009)  Trig:338.0 (11/08/2009), 357.0 (07/23/2009)  Problem # 4:  BREAST CANCER (ICD-174.9) Assessment: Unchanged doing very well on arimedex and prep for breast reconstruction   Complete Medication List: 1)  Zoloft 100 Mg Tabs (Sertraline hcl) .... Take one by mouth once a day 2)  Zyrtec 10 Mg Tabs (Cetirizine hcl) .... Take 1 tablet by mouth once a day as needed 3)  Hydrochlorothiazide 25 Mg Tabs (Hydrochlorothiazide) .... Take 1 tablet by mouth once a day 4)  Zetia 10 Mg Tabs (Ezetimibe) .... Take 1 tablet by mouth once a day 5)  Vasotec 20 Mg Tabs (Enalapril maleate) .... Take one by mouth once a day 6)  Omega 3-6-9  .... Take 1 capsule by mouth once a day 7)  Daily Multiple Vitamins Tabs (Multiple vitamin) .... Take 1 tablet by mouth once a day 8)  Requip 1 Mg Tabs (Ropinirole hcl) .Marland Kitchen.. 1 by mouth at bedtime 9)  Flonase 50 Mcg/act Susp (Fluticasone propionate) .... 2 sprays each  nostril once daily as needed 10)  Zocor 10 Mg Tabs (Simvastatin) .... 1/2 by mouth once daily 11)  Alprazolam 0.5 Mg Tabs (Alprazolam) .Marland Kitchen.. 1 by mouth up to three times a day as needed anxiety careful of sedation 12)  Integra Plus Caps (Fefum-fepoly-fa-b cmp-c-biot) .... Take 1 capsule by mouth once a day 13)  Omeprazole 20 Mg Tbec (Omeprazole) .... Take one by mouth twice a day 14)  Alendronate Sodium 70 Mg Tabs (Alendronate sodium) .... Take one by mouth once a week 15)  Vitamin D3 ?iu  .... Take 1 tablet by mouth once a day 16)  Armidex  .... Take 1 tablet by mouth once a day  Patient Instructions: 1)  we will schedule abdominal ultrasound at check out  2)  keep up the good work with diet and exercise  3)  avoid sugar and simple carbohydrates 4)  no change in medicines 5)  schedule fasting labs in 3 months lipid/hepatic/ AIC / renal 250.0, 272 and then follow up   Current Allergies (reviewed today): PENICILLIN G POTASSIUM (PENICILLIN G POTASSIUM)

## 2010-04-19 NOTE — Progress Notes (Signed)
Summary: needs something for anxiety  Phone Note Call from Patient Call back at Home Phone [phone]   Caller: Patient Call For: Judith Part MD Summary of Call: Pt is having a bilateral mastectomy next week and she is getting anxious about the surgery, not sleeping.  She is asking if something to calm her can be called to [facility].   Initial call taken by: Lowella Petties CMA,  May 03, 2009 2:36 PM  Follow-up for Phone Call        can try some xanax if careful with it / due to sedation and habit forming nature I think it would really help in the short term px written on EMR for call in  let me know if she needs anything else/ hope her surgery goes well Follow-up by: Judith Part MD,  May 03, 2009 3:41 PM  Additional Follow-up for Phone Call Additional follow up Details #1::        Medication phoned to [facility] as instructed. Patient notified as instructed by telephone. Lewanda Rife LPN  May 03, 2009 4:20 PM     New/Updated Medications: ALPRAZOLAM 0.5 MG TABS (ALPRAZOLAM) 1 by mouth up to three times a day as needed anxiety careful of sedation Prescriptions: ALPRAZOLAM 0.5 MG TABS (ALPRAZOLAM) 1 by mouth up to three times a day as needed anxiety careful of sedation  #30 x 0   Entered and Authorized by:   Judith Part MD   Signed by:   Judith Part MD on 05/03/2009   Method used:   Telephoned to ...       [facility]ail[facility]       [address]       [place], [place]  [address]       Ph: [phone] or [phone]       Fax: [phone]   RxID:   [id number]

## 2010-04-19 NOTE — Letter (Signed)
[facility]   Imported By: Lanelle Bal 08/19/2009 12:21:49  _____________________________________________________________________  External Attachment:    Type:   Image     Comment:   External Document

## 2010-04-19 NOTE — Letter (Signed)
Summary: [facility]   Imported By: Lanelle Bal 04/15/2009 11:50:54  _____________________________________________________________________  External Attachment:    Type:   Image     Comment:   External Document

## 2010-04-19 NOTE — Progress Notes (Signed)
Summary: [facility]  Phone Note Refill Request Message from:  [facility] on March 23, 2009 3:15 PM  Refills Requested: Medication #1:  ZETIA 10 MG TABS Take 1 tablet by mouth once a day  Medication #2:  HYDROCHLOROTHIAZIDE 25 MG TABS Take 1 tablet by mouth once a day Form on your desk    Method Requested: Fax to Mail Away Pharmacy Initial call taken by: Mervin Hack CMA Duncan Dull),  March 23, 2009 3:16 PM  Follow-up for Phone Call        form done and in nurse in box   Follow-up by: Judith Part MD,  March 23, 2009 4:13 PM  Additional Follow-up for Phone Call Additional follow up Details #1::        Forms faxed. Additional Follow-up by: Lowella Petties CMA,  March 23, 2009 4:58 PM    New/Updated Medications: HYDROCHLOROTHIAZIDE 25 MG TABS (HYDROCHLOROTHIAZIDE) Take 1 tablet by mouth once a day ZETIA 10 MG TABS (EZETIMIBE) Take 1 tablet by mouth once a day Prescriptions: HYDROCHLOROTHIAZIDE 25 MG TABS (HYDROCHLOROTHIAZIDE) Take 1 tablet by mouth once a day  #90 x 3   Entered and Authorized by:   Judith Part MD   Signed by:   Lowella Petties CMA on 03/23/2009   Method used:   Historical   RxID:   [id number] ZETIA 10 MG TABS (EZETIMIBE) Take 1 tablet by mouth once a day  #90 x 3   Entered and Authorized by:   Judith Part MD   Signed by:   Lowella Petties CMA on 03/23/2009   Method used:   Historical   RxID:   [id number]

## 2010-04-19 NOTE — Letter (Signed)
[facility]   Imported By: Maryln Gottron 11/02/2009 12:34:39  _____________________________________________________________________  External Attachment:    Type:   Image     Comment:   External Document

## 2010-04-19 NOTE — Miscellaneous (Signed)
Summary: Controlled Substance Agreement  Controlled Substance Agreement   Imported By: Lanelle Bal 11/18/2009 11:26:11  _____________________________________________________________________  External Attachment:    Type:   Image     Comment:   External Document

## 2010-04-19 NOTE — Assessment & Plan Note (Signed)
Summary: 6TH F/U AFTER LABS /LFW   Vital Signs:  Patient profile:   65 year old female Height:      64.5 inches Weight:      167.25 pounds BMI:     28.37 Temp:     98.5 degrees F oral Pulse rate:   88 / minute Pulse rhythm:   regular BP sitting:   120 / 80  (left arm) Cuff size:   regular  Vitals Entered By: Lewanda Rife LPN (Aug 02, 2009 11:34 AM) CC: six month f/u after labs   History of Present Illness: here for f/u of lipids/ DM/ HTN / liver enzymes   in interum was dx with breast ca and had double mastectomy some difficulties with healing of grafting skin  have not started reconstruction fully yet- so this week will start that  will find out about chemo next week (no radiation)  waiting on some test results   thinks she has done pretty well emotinally    wt is up 6 lb today her attitude and habits changed for a while is ready to get back to normal  husband was supportive for 2 weeks- now he is back to [place]-- she is no longer traveling there  is fatiged - oncologist put her on iron pills    bp good  at 120/60  lipids worse with trig 357 and LDL 130 (up from 112) can only tol tiny dose of statin  AIC 6.6 (up from 6.3)- diet has admittedly been bad   ast/alt 66/45-- fairly stable   is ready to start taking care of herself again   Allergies: 1)  Penicillin G Potassium (Penicillin G Potassium)  Past History:  Past Surgical History: Last updated: 05/17/2009 Hysterectomy (1983) Hosp- salmonella, renal effects secondary to dehydration (05/2000) EGD- hiatal hernia Colonoscopy- stricture (01/2003) EGD- GERD, HH, stricture (01/2003) ABD [place]- fatty liver (01/2004) MRI- small vessel change (11/2004) EEG- had to repeat, repeat negative (11/2004) 11/09 admit hosp - melena, anemia (transfusion)- GI bleed presumed upper (11/09) EGD- esoph stricture  ovarian tumor removed -1986 12/10 breast biopsy- invasive ductal carcinoma 2/11 - bilateral mastectomys for ductal  carcinoma on L   Family History: Last updated: 03/22/08 Father: deceased age 46- heart disease, MI Mother:  Siblings: 1 brother- HTN.  3 sisters, 2 with breast cancer No FH of Colon Cancer: Family History of Esophageal Cancer: Father Family History of Diabetes: Father, sister  Social History: Last updated: 2008-03-22 Marital Status  married Children: 3 Occupation: retired non smoker -never move back and forth between 2 residences up [place] and here due to [employer] job-- is very stressful Alcohol Use - no Daily Caffeine Use Illicit Drug Use - no Patient does not get regular exercise.   Risk Factors: Caffeine Use: 2 (March 22, 2008) Exercise: no (03/22/2008)  Risk Factors: Smoking Status: never (08/22/2006)  Past Medical History: Allergic rhinitis Anxiety GERD Hypertension Seizure disorder Hyperlipidemia RLS Arthritis Diverticulosis GI Bleed cataracts  breast cancer 12/10 L invasive ductal carcinoma (hormone receptor positive )   surgeon-  opthy- Shapiro   Review of Systems General:  Denies fatigue, loss of appetite, and malaise. Eyes:  Denies blurring and eye irritation. CV:  Denies chest pain or discomfort, near fainting, palpitations, and shortness of breath with exertion. Resp:  Denies cough and wheezing. GI:  Denies abdominal pain, bloody stools, change in bowel habits, and nausea. MS:  Denies joint redness, joint swelling, and muscle aches; L leg and back occ bother her . Derm:  Denies  itching, lesion(s), poor wound healing, and rash. Neuro:  Denies numbness and tingling. Psych:  attitude is good and mood positive for the most part. Endo:  Denies cold intolerance, excessive thirst, excessive urination, and heat intolerance. Heme:  Denies abnormal bruising and bleeding.  Physical Exam  General:  overweight but generally well appearing  Head:  normocephalic, atraumatic, and no abnormalities observed.   Eyes:  vision grossly intact, pupils equal, pupils  round, and pupils reactive to light.  no conjunctival pallor, injection or icterus  Mouth:  pharynx pink and moist.   Neck:  supple with full rom and no masses or thyromegally, no JVD or carotid bruit  Breasts:  s/p total double mastectomy  Lungs:  Normal respiratory effort, chest expands symmetrically. Lungs are clear to auscultation, no crackles or wheezes. Heart:  Normal rate and regular rhythm. S1 and S2 normal without gallop, murmur, click, rub or other extra sounds. Abdomen:  Bowel sounds positive,abdomen soft and non-tender without masses, organomegaly or hernias noted. no renal bruits  Msk:  No deformity or scoliosis noted of thoracic or lumbar spine.   Pulses:  R and L carotid,radial,femoral,dorsalis pedis and posterior tibial pulses are full and equal bilaterally Extremities:  No clubbing, cyanosis, edema, or deformity noted with normal full range of motion of all joints.   Neurologic:  sensation intact to light touch and gait normal.   Skin:  Intact without suspicious lesions or rashes Cervical Nodes:  No lymphadenopathy noted Psych:  normal affect, talkative and pleasant  very good attitude   Diabetes Management Exam:    Foot Exam (with socks and/or shoes not present):       Sensory-Pinprick/Light touch:          Left medial foot (L-4): normal          Left dorsal foot (L-5): normal          Left lateral foot (S-1): normal          Right medial foot (L-4): normal          Right dorsal foot (L-5): normal          Right lateral foot (S-1): normal       Sensory-Monofilament:          Left foot: normal          Right foot: normal       Inspection:          Left foot: normal          Right foot: normal       Nails:          Left foot: normal          Right foot: normal   Impression & Recommendations:  Problem # 1:  DIABETES MELLITUS, TYPE II (ICD-250.00) Assessment Deteriorated  AIC up with poor diet but ready to get back on track  disc healthy diet (low simple sugar/  choose complex carbs/ low sat fat) diet and exercise in detail  opthy utd  lab  3 mo and f/u Her updated medication list for this problem includes:    Vasotec 20 Mg Tabs (Enalapril maleate) .Marland Kitchen... Take one by mouth once a day  Labs Reviewed: Creat: 0.7 (07/23/2009)     Last Eye Exam: normal (12/31/2008) Reviewed HgBA1c results: 6.6 (07/23/2009)  6.3 (01/21/2009)  Problem # 2:  HYPERLIPIDEMIA (ICD-272.4) Assessment: Deteriorated  lipids are up on max tol statinand zetia after poor diet is ready to get better rev low sat  fat diet recheck in 3 mo and f/u Her updated medication list for this problem includes:    Zetia 10 Mg Tabs (Ezetimibe) .Marland Kitchen... Take 1 tablet by mouth once a day    Zocor 10 Mg Tabs (Simvastatin) .Marland Kitchen... 1/2 by mouth once daily  Labs Reviewed: SGOT: 66 (07/23/2009)   SGPT: 45 (07/23/2009)   HDL:31.90 (07/23/2009), 33.90 (01/21/2009)  LDL:DEL (11/29/2007), DEL (08/16/2007)  Chol:203 (07/23/2009), 169 (01/21/2009)  Trig:357.0 (07/23/2009), 227.0 (01/21/2009)  Problem # 3:  Hx of FATTY LIVER DISEASE (ICD-571.8) Assessment: Unchanged ast/alt are fairly stable -- again urged low fat diet and wt loss  re check 3 mo and f/u  Problem # 4:  BREAST CANCER (ICD-174.9) Assessment: Comment Only doing very well s/p double mastectomy with neg LN will likely need anti estrogen agent  unlikely chemo to start some reconstruction heme - working on post op anemia  pt is doing physically /emot well strong fam hx   Problem # 5:  GERD (ICD-530.81) Assessment: Unchanged with hx of stricture in past  doing well on PPI refil given  Her updated medication list for this problem includes:    Omeprazole 40 Mg Cpdr (Omeprazole) .Marland Kitchen... 1 by mouth two times a day  Complete Medication List: 1)  Zoloft 100 Mg Tabs (Sertraline hcl) .... Take one by mouth once a day 2)  Zyrtec 10 Mg Tabs (Cetirizine hcl) .... Take 1 tablet by mouth once a day as needed 3)  Hydrochlorothiazide 25 Mg Tabs  (Hydrochlorothiazide) .... Take 1 tablet by mouth once a day 4)  Zetia 10 Mg Tabs (Ezetimibe) .... Take 1 tablet by mouth once a day 5)  Vasotec 20 Mg Tabs (Enalapril maleate) .... Take one by mouth once a day 6)  Omega 3-6-9  .... Take 1 capsule by mouth once a day 7)  Daily Multiple Vitamins Tabs (Multiple vitamin) .... Take 1 tablet by mouth once a day 8)  Requip 1 Mg Tabs (Ropinirole hcl) .Marland Kitchen.. 1 by mouth at bedtime 9)  Flonase 50 Mcg/act Susp (Fluticasone propionate) .... 2 sprays each nostril once daily as needed 10)  Omeprazole 40 Mg Cpdr (Omeprazole) .Marland Kitchen.. 1 by mouth two times a day 11)  Zocor 10 Mg Tabs (Simvastatin) .... 1/2 by mouth once daily 12)  Lorazepam 1 Mg Tabs (Lorazepam) .Marland Kitchen.. 1 tab 15-20 minutes prior to procedure. 13)  Alprazolam 0.5 Mg Tabs (Alprazolam) .Marland Kitchen.. 1 by mouth up to three times a day as needed anxiety careful of sedation 14)  Integra Plus Caps (Fefum-fepoly-fa-b cmp-c-biot) .... Take 1 capsule by mouth once a day  Patient Instructions: 1)  mood and attitude are incredible - keep it up  2)  no change in medicines 3)  please get back into habit of better diet and exercise - to get sugar and cholesterol down  4)  aim for 20-30 minutes of exercise per day -- 5 days per week  5)  schedule fasting labs in 3 months and then follow up lipid/ast/alt / AIC / renal 250.0, 272  Prescriptions: OMEPRAZOLE 40 MG CPDR (OMEPRAZOLE) 1 by mouth two times a day  #180 x 3   Entered and Authorized by:   Judith Part MD   Signed by:   Judith Part MD on 08/02/2009   Method used:   Print then Give to Patient   RxID:   [id number]   Current Allergies (reviewed today): PENICILLIN G POTASSIUM (PENICILLIN G POTASSIUM)

## 2010-04-20 ENCOUNTER — Telehealth: Payer: Self-pay | Admitting: Family Medicine

## 2010-04-22 ENCOUNTER — Telehealth: Payer: Self-pay | Admitting: Family Medicine

## 2010-04-27 NOTE — Progress Notes (Signed)
Summary: needs refill on requip- waiting for mail order  Phone Note Refill Request Call back at Home Phone [phone] Message from:  Patient  Refills Requested: Medication #1:  REQUIP 1 MG  TABS 1 by mouth at bedtime Pt is asking for an 8 day supply to be [facility] to [facility], she is waiting for her mail order supply to come in.  Initial call taken by: Lowella Petties CMA, AAMA,  April 22, 2010 9:44 AM  Follow-up for Phone Call        px written on EMR for call in  Follow-up by: Judith Part MD,  April 22, 2010 9:54 AM  Additional Follow-up for Phone Call Additional follow up Details #1::        Medication [facility]oned to [facility] as instructed. Patient notified as instructed by telephone. Lewanda Rife LPN  April 22, 2010 10:16 AM     New/Updated Medications: REQUIP 1 MG TABS (ROPINIROLE HCL) 1 by mouth at bedtime Prescriptions: REQUIP 1 MG TABS (ROPINIROLE HCL) 1 by mouth at bedtime  #15 x 0   Entered and Authorized by:   Judith Part MD   Signed by:   Lewanda Rife LPN on 09/81/1914   Method used:   Telephoned to ...       [facility]ail)       [address]       [place], [place]  [address]       Ph: [phone] or [phone]       Fax: [phone]   RxID:   [id number]

## 2010-04-27 NOTE — Progress Notes (Signed)
Summary: refill request for xanax  Phone Note Refill Request Call back at Home Phone [phone] Message from:  Patient  Refills Requested: Medication #1:  ALPRAZOLAM 0.5 MG TABS 1 by mouth up to three times a day as needed anxiety careful of sedation Pt is asking for refill on xanax because she will be flying to [place] soon. She will need enough for round trip.  [facility].    Initial call taken by: Lowella Petties CMA, AAMA,  April 20, 2010 12:55 PM  Follow-up for Phone Call        px written on EMR for call in  Follow-up by: Judith Part MD,  April 20, 2010 4:44 PM  Additional Follow-up for Phone Call Additional follow up Details #1::        [facility] as instructed. Patient notified as instructed by telephone. Lewanda Rife LPN  April 20, 2010 4:53 PM     Prescriptions: ALPRAZOLAM 0.5 MG TABS (ALPRAZOLAM) 1 by mouth up to three times a day as needed anxiety careful of sedation  #30 x 0   Entered and Authorized by:   Judith Part MD   Signed by:   Lewanda Rife LPN on 57/84/6962   Method used:   Telephoned to ...       [facility] (retail)       [address]       [place], [place]  [address]       Ph: [phone] or [phone]       Fax: [phone]   RxID:   [id number]

## 2010-05-03 ENCOUNTER — Other Ambulatory Visit (INDEPENDENT_AMBULATORY_CARE_PROVIDER_SITE_OTHER): Payer: 59

## 2010-05-03 ENCOUNTER — Other Ambulatory Visit: Payer: Self-pay | Admitting: Family Medicine

## 2010-05-03 ENCOUNTER — Telehealth (INDEPENDENT_AMBULATORY_CARE_PROVIDER_SITE_OTHER): Payer: Self-pay | Admitting: *Deleted

## 2010-05-03 ENCOUNTER — Encounter (INDEPENDENT_AMBULATORY_CARE_PROVIDER_SITE_OTHER): Payer: Self-pay | Admitting: *Deleted

## 2010-05-03 DIAGNOSIS — E785 Hyperlipidemia, unspecified: Secondary | ICD-10-CM

## 2010-05-03 DIAGNOSIS — E119 Type 2 diabetes mellitus without complications: Secondary | ICD-10-CM

## 2010-05-03 DIAGNOSIS — I10 Essential (primary) hypertension: Secondary | ICD-10-CM

## 2010-05-03 LAB — CBC WITH DIFFERENTIAL/PLATELET
Basophils Absolute: 0 10*3/uL (ref 0.0–0.1)
Basophils Relative: 0.3 % (ref 0.0–3.0)
Eosinophils Absolute: 0.1 10*3/uL (ref 0.0–0.7)
HCT: 34.9 % — ABNORMAL LOW (ref 36.0–46.0)
Hemoglobin: 11.9 g/dL — ABNORMAL LOW (ref 12.0–15.0)
Lymphs Abs: 1.5 10*3/uL (ref 0.7–4.0)
MCHC: 34.2 g/dL (ref 30.0–36.0)
MCV: 83.7 fl (ref 78.0–100.0)
Monocytes Absolute: 0.4 10*3/uL (ref 0.1–1.0)
Neutro Abs: 2.8 10*3/uL (ref 1.4–7.7)
RBC: 4.18 Mil/uL (ref 3.87–5.11)

## 2010-05-03 LAB — BASIC METABOLIC PANEL WITH GFR
BUN: 15 mg/dL (ref 6–23)
CO2: 32 meq/L (ref 19–32)
Calcium: 8.9 mg/dL (ref 8.4–10.5)
Chloride: 104 meq/L (ref 96–112)
Creatinine, Ser: 0.9 mg/dL (ref 0.4–1.2)
GFR: 71.86 mL/min
Glucose, Bld: 121 mg/dL — ABNORMAL HIGH (ref 70–99)
Potassium: 4.4 meq/L (ref 3.5–5.1)
Sodium: 143 meq/L (ref 135–145)

## 2010-05-03 LAB — HEMOGLOBIN A1C: Hgb A1c MFr Bld: 7 % — ABNORMAL HIGH (ref 4.6–6.5)

## 2010-05-03 LAB — HEPATIC FUNCTION PANEL
ALT: 47 U/L — ABNORMAL HIGH (ref 0–35)
AST: 62 U/L — ABNORMAL HIGH (ref 0–37)
Albumin: 3.7 g/dL (ref 3.5–5.2)
Alkaline Phosphatase: 58 U/L (ref 39–117)
Bilirubin, Direct: 0 mg/dL (ref 0.0–0.3)
Total Bilirubin: 0.7 mg/dL (ref 0.3–1.2)
Total Protein: 7 g/dL (ref 6.0–8.3)

## 2010-05-03 LAB — LIPID PANEL
Cholesterol: 201 mg/dL — ABNORMAL HIGH (ref 0–200)
HDL: 29.5 mg/dL — ABNORMAL LOW
Total CHOL/HDL Ratio: 7
Triglycerides: 471 mg/dL — ABNORMAL HIGH (ref 0.0–149.0)
VLDL: 94.2 mg/dL — ABNORMAL HIGH (ref 0.0–40.0)

## 2010-05-03 LAB — TSH: TSH: 2.71 u[IU]/mL (ref 0.35–5.50)

## 2010-05-05 ENCOUNTER — Encounter: Payer: Self-pay | Admitting: Family Medicine

## 2010-05-05 ENCOUNTER — Ambulatory Visit (INDEPENDENT_AMBULATORY_CARE_PROVIDER_SITE_OTHER): Payer: 59 | Admitting: Family Medicine

## 2010-05-05 DIAGNOSIS — T887XXA Unspecified adverse effect of drug or medicament, initial encounter: Secondary | ICD-10-CM | POA: Insufficient documentation

## 2010-05-05 DIAGNOSIS — E119 Type 2 diabetes mellitus without complications: Secondary | ICD-10-CM

## 2010-05-05 DIAGNOSIS — E785 Hyperlipidemia, unspecified: Secondary | ICD-10-CM

## 2010-05-05 DIAGNOSIS — K7689 Other specified diseases of liver: Secondary | ICD-10-CM

## 2010-05-05 LAB — HM DIABETES FOOT EXAM

## 2010-05-05 NOTE — Letter (Signed)
Summary: [facility]  [facility]   Imported By: Kassie Mends 04/27/2010 10:20:35  _____________________________________________________________________  External Attachment:    Type:   Image     Comment:   External Document

## 2010-05-11 NOTE — Progress Notes (Signed)
----   Converted from flag ---- ---- 05/02/2010 8:50 PM, Colon Flattery Tower MD wrote: please check wellness, lipid and AIC for v70.0 and 272 and 250.0 thanks   ---- 05/02/2010 1:23 PM, Liane Comber CMA (AAMA) wrote: Lab orders please! Good Morning! This pt is scheduled for cpx labs Tuesday, which labs to draw and dx codes to use? Thanks Tasha ------------------------------

## 2010-05-17 NOTE — Assessment & Plan Note (Signed)
Summary: FOLLOW UP / LFW   Vital Signs:  Patient profile:   65 year old female Height:      64.5 inches Weight:      164.50 pounds BMI:     27.90 Temp:     98.1 degrees F oral Pulse rate:   80 / minute Pulse rhythm:   regular BP sitting:   128 / 78  (left arm) Cuff size:   regular  Vitals Entered By: Lewanda Rife LPN (May 05, 2010 10:35 AM) CC: follow up after labs   History of Present Illness: here for f/u of lipids and DM and fatty liver  is feeling terrible  aching all over - does not think it is from her cholesterol medicine -- thinks it is from her cancer med  is on arimidex -- and told she has to get through it  needs to disc further with her oncologist  has arthritis too - this does not help   is not exercising because she is aching so bad   diet is getting better gradually  even husband is eating healthier  will go to [employer] here soon   she quit going to [place] with her husband - this was a great decision  also does not sleep well      lipids are imp in LDL 118 but up in trig 471  AIC up to 7.0 from 6.9 is not eating sweets at all  wants to start checking sugar   ast/alt a little better   hb 11.9  wt stable  good control HTN 128/78  Allergies: 1)  Penicillin G Potassium (Penicillin G Potassium)  Past History:  Past Medical History: Last updated: 08/02/2009 Allergic rhinitis Anxiety GERD Hypertension Seizure disorder Hyperlipidemia RLS Arthritis Diverticulosis GI Bleed cataracts  breast cancer 12/10 L invasive ductal carcinoma (hormone receptor positive )   surgeon-  opthyNile Riggs   Past Surgical History: Last updated: 05/17/2009 Hysterectomy (1983) Hosp- salmonella, renal effects secondary to dehydration (05/2000) EGD- hiatal hernia Colonoscopy- stricture (01/2003) EGD- GERD, HH, stricture (01/2003) ABD [place]- fatty liver (01/2004) MRI- small vessel change (11/2004) EEG- had to repeat, repeat negative (11/2004) 11/09  admit hosp - melena, anemia (transfusion)- GI bleed presumed upper (11/09) EGD- esoph stricture  ovarian tumor removed -1986 12/10 breast biopsy- invasive ductal carcinoma 2/11 - bilateral mastectomys for ductal carcinoma on L   Family History: Last updated: 03-10-08 Father: deceased age 27- heart disease, MI Mother:  Siblings: 1 brother- HTN.  3 sisters, 2 with breast cancer No FH of Colon Cancer: Family History of Esophageal Cancer: Father Family History of Diabetes: Father, sister  Social History: Last updated: 03/10/2008 Marital Status  married Children: 3 Occupation: retired non smoker -never move back and forth between 2 residences up [place] and here due to [employer] job-- is very stressful Alcohol Use - no Daily Caffeine Use Illicit Drug Use - no Patient does not get regular exercise.   Risk Factors: Caffeine Use: 2 (03-10-2008) Exercise: no (10-Mar-2008)  Risk Factors: Smoking Status: never (08/22/2006)  Review of Systems General:  Complains of malaise; denies fatigue, fever, and loss of appetite. Eyes:  Denies blurring and eye irritation. CV:  Denies chest pain or discomfort, fatigue, and lightheadness. Resp:  Denies shortness of breath. GI:  Denies abdominal pain, diarrhea, nausea, and vomiting. GU:  Denies dysuria and urinary frequency. MS:  Complains of joint pain, muscle aches, cramps, and stiffness; denies joint redness, joint swelling, and muscle weakness. Derm:  Denies itching, lesion(s),  poor wound healing, and rash. Neuro:  Denies numbness and tingling. Psych:  mood is a little better - less stress from traveling . Endo:  Denies cold intolerance, excessive thirst, excessive urination, and heat intolerance. Heme:  Denies abnormal bruising and bleeding.  Physical Exam  General:  Well-developed,well-nourished,in no acute distress; alert,appropriate and cooperative throughout examination Head:  normocephalic, atraumatic, and no abnormalities observed.     Eyes:  vision grossly intact, pupils equal, pupils round, and pupils reactive to light.  no conjunctival pallor, injection or icterus  Mouth:  pharynx pink and moist.   Neck:  supple with full rom and no masses or thyromegally, no JVD or carotid bruit  Chest Wall:  No deformities, masses, or tenderness noted. Lungs:  Normal respiratory effort, chest expands symmetrically. Lungs are clear to auscultation, no crackles or wheezes. Heart:  Normal rate and regular rhythm. S1 and S2 normal without gallop, murmur, click, rub or other extra sounds. Abdomen:  Bowel sounds positive,abdomen soft and non-tender without masses, organomegaly or hernias noted. no renal bruits  Msk:  No deformity or scoliosis noted of thoracic or lumbar spine.  no acute joint changes  Pulses:  R and L carotid,radial,femoral,dorsalis pedis and posterior tibial pulses are full and equal bilaterally Extremities:  No clubbing, cyanosis, edema, or deformity noted with normal full range of motion of all joints.   Neurologic:  sensation intact to light touch and gait normal.   Skin:  Intact without suspicious lesions or rashes Cervical Nodes:  No lymphadenopathy noted Inguinal Nodes:  No significant adenopathy Psych:  normal affect, talkative and pleasant   Diabetes Management Exam:    Foot Exam (with socks and/or shoes not present):       Sensory-Pinprick/Light touch:          Left medial foot (L-4): normal          Left dorsal foot (L-5): normal          Left lateral foot (S-1): normal          Right medial foot (L-4): normal          Right dorsal foot (L-5): normal          Right lateral foot (S-1): normal       Sensory-Monofilament:          Left foot: normal          Right foot: normal       Inspection:          Left foot: normal          Right foot: normal       Nails:          Left foot: normal          Right foot: normal   Impression & Recommendations:  Problem # 1:  TRANSAMINASES, SERUM, ELEVATED  (ICD-790.4) Assessment Improved this is improved with lower fat diet  urged to keep it up   Problem # 2:  DIABETES MELLITUS, TYPE II (ICD-250.00) Assessment: Deteriorated  worse starting metformin disc low glycemic diet  will start to check sugars two times a day initially f/u 4-6 wk Her updated medication list for this problem includes:    Vasotec 20 Mg Tabs (Enalapril maleate) .Marland Kitchen... Take one by mouth once a day    Glucophage 500 Mg Tabs (Metformin hcl) .Marland Kitchen... 1 by mouth two times a day  Orders: Prescription Created Electronically [phone])  Problem # 3:  HYPERLIPIDEMIA (ICD-272.4) trig up poss from her  DM or arimidex? re check after better DM control disc diet rev labs  Her updated medication list for this problem includes:    Zetia 10 Mg Tabs (Ezetimibe) .Marland Kitchen... Take 1 tablet by mouth once a day    Zocor 10 Mg Tabs (Simvastatin) .Marland Kitchen... 1/2 by mouth once daily  Problem # 4:  ADVERSE DRUG REACTION (ICD-995.20) miserable with arimedex with poor quality of life urged to d/w her oncol  Complete Medication List: 1)  Zoloft 100 Mg Tabs (Sertraline hcl) .... Take one by mouth once a day 2)  Zyrtec 10 Mg Tabs (Cetirizine hcl) .... Take 1 tablet by mouth once a day as needed 3)  Hydrochlorothiazide 25 Mg Tabs (Hydrochlorothiazide) .... Take 1 tablet by mouth once a day 4)  Zetia 10 Mg Tabs (Ezetimibe) .... Take 1 tablet by mouth once a day 5)  Vasotec 20 Mg Tabs (Enalapril maleate) .... Take one by mouth once a day 6)  Omega 3-6-9  .... Take 1 capsule by mouth once a day 7)  Daily Multiple Vitamins Tabs (Multiple vitamin) .... Take 1 tablet by mouth once a day 8)  Requip 1 Mg Tabs (Ropinirole hcl) .Marland Kitchen.. 1 by mouth at bedtime 9)  Flonase 50 Mcg/act Susp (Fluticasone propionate) .... 2 sprays each nostril once daily as needed 10)  Zocor 10 Mg Tabs (Simvastatin) .... 1/2 by mouth once daily 11)  Alprazolam 0.5 Mg Tabs (Alprazolam) .Marland Kitchen.. 1 by mouth up to three times a day as needed anxiety  careful of sedation 12)  Omeprazole 20 Mg Tbec (Omeprazole) .... Take one by mouth twice a day 13)  Alendronate Sodium 70 Mg Tabs (Alendronate sodium) .... Take one by mouth once a week 14)  Vitamin D3 ?iu  .... Take 1 tablet by mouth once a day 15)  Armidex  .... Take 1 tablet by mouth once a day 16)  Anastrazole ?mg  .... Take 1 tablet by mouth once a day 17)  Glucometer  .... To check glucose two times a day and as needed for dm2 250.0 18)  Glucose Strips and Lancets  .... To check glucose two times a day and as needed for dm 2 250.0 19)  Glucophage 500 Mg Tabs (Metformin hcl) .Marland Kitchen.. 1 by mouth two times a day  Patient Instructions: 1)  check sugar am fasting and 2 hours after lunch or dinner 2)  keep a log 3)  goal am is 120 or below, pm is 140 or below  4)  aim for diabetic diet  5)  get back to exercise when you can  6)  follow up with me in 4-6 weeks with glucose log  7)  try volarian (herb ) over the counter as directed for sleep  Prescriptions: GLUCOPHAGE 500 MG TABS (METFORMIN HCL) 1 by mouth two times a day  #60 x 11   Entered and Authorized by:   Judith Part MD   Signed by:   Judith Part MD on 05/05/2010   Method used:   [facility]        [facility]       [address]       [place], [place]  [address]       Ph: [phone] or [phone]       Fax: [phone]   RxID:   [id number] GLUCOSE STRIPS AND LANCETS to check glucose two times a day and as needed for DM 2 250.0  #100 each x 3  Entered and Authorized by:   Judith Part MD   Signed by:   Judith Part MD on 05/05/2010   Method used:   Print then Give to Patient   RxID:   [id number] GLUCOMETER to check glucose two times a day and as needed for dm2 250.0  #1 x 0   Entered and Authorized by:   Judith Part MD   Signed by:   Judith Part MD on 05/05/2010   Method used:   Print then Give to Patient   RxID:   [id number]    Orders Added: 1)   Est. Patient Level IV [85462] 2)  Prescription Created Electronically [phone]    Current Allergies (reviewed today): PENICILLIN G POTASSIUM (PENICILLIN G POTASSIUM)

## 2010-06-08 LAB — COMPREHENSIVE METABOLIC PANEL
ALT: 53 U/L — ABNORMAL HIGH (ref 0–35)
Alkaline Phosphatase: 89 U/L (ref 39–117)
BUN: 13 mg/dL (ref 6–23)
CO2: 29 mEq/L (ref 19–32)
GFR calc non Af Amer: >60 mL/min (ref >60)
Glucose, Bld: 127 mg/dL — ABNORMAL HIGH (ref 70–99)
Potassium: 3.5 mEq/L (ref 3.5–5.1)
Sodium: 138 mEq/L (ref 135–145)

## 2010-06-08 LAB — DIFFERENTIAL
Basophils Absolute: 0 10*3/uL (ref 0.0–0.1)
Basophils Relative: 0 % (ref 0–1)
Eosinophils Absolute: 0.1 10*3/uL (ref 0.0–0.7)
Neutro Abs: 3.6 10*3/uL (ref 1.7–7.7)
Neutrophils Relative %: 59 % (ref 43–77)

## 2010-06-08 LAB — CBC
HCT: 32.7 % — ABNORMAL LOW (ref 36.0–46.0)
Hemoglobin: 11 g/dL — ABNORMAL LOW (ref 12.0–15.0)
MCHC: 33.7 g/dL (ref 30.0–36.0)
RBC: 4.08 MIL/uL (ref 3.87–5.11)
RDW: 16.8 % — ABNORMAL HIGH (ref 11.5–15.5)

## 2010-06-08 LAB — CULTURE, BLOOD (ROUTINE X 2): Culture: NO GROWTH

## 2010-06-14 ENCOUNTER — Ambulatory Visit: Payer: 59 | Admitting: Family Medicine

## 2010-07-09 ENCOUNTER — Encounter: Payer: Self-pay | Admitting: Family Medicine

## 2010-07-18 ENCOUNTER — Encounter: Payer: Self-pay | Admitting: Family Medicine

## 2010-07-18 ENCOUNTER — Ambulatory Visit (INDEPENDENT_AMBULATORY_CARE_PROVIDER_SITE_OTHER): Payer: 59 | Admitting: Family Medicine

## 2010-07-18 ENCOUNTER — Ambulatory Visit (INDEPENDENT_AMBULATORY_CARE_PROVIDER_SITE_OTHER)
Admission: RE | Admit: 2010-07-18 | Discharge: 2010-07-18 | Disposition: A | Discharge status: Home / Self Care | Payer: 59 | Source: Ambulatory Visit | Attending: Family Medicine | Admitting: Family Medicine

## 2010-07-18 DIAGNOSIS — M25539 Pain in unspecified wrist: Secondary | ICD-10-CM

## 2010-07-18 DIAGNOSIS — E785 Hyperlipidemia, unspecified: Secondary | ICD-10-CM

## 2010-07-18 DIAGNOSIS — I10 Essential (primary) hypertension: Secondary | ICD-10-CM

## 2010-07-18 DIAGNOSIS — E119 Type 2 diabetes mellitus without complications: Secondary | ICD-10-CM

## 2010-07-18 MED ORDER — OMEPRAZOLE 20 MG PO CPDR: 20.0000 mg | DELAYED_RELEASE_CAPSULE | Freq: Two times a day (BID) | ORAL | Status: DC

## 2010-07-18 NOTE — Assessment & Plan Note (Signed)
On statin- high trig this last check Expect imp in may with better diet and sugar control

## 2010-07-18 NOTE — Progress Notes (Signed)
Subjective:    Patient ID: Valerie Ball, female    DOB: 06/07/1945, 65 y.o.   MRN: [id number]  HPI Here for f/u of DM2 and HTN and lipids -- also arm injury and also needs acid reflux med refilled   Was spraying a horse -- she got bumped over and fell backwards over a metal post  R arm is bruised and quite swollen and can move it Only hurts to move it  Is not red  No skin interruption   Needs refil on omeprazole  This controlls her reflux well   Last visit a1c was up Lab Results  Component Value Date   HGBA1C 7.0* 05/03/2010   started on metformin 500 bid  Highest sugar since last visit - after eating bad thing 160 Fasting - usual range is 95-110 in am  2 hours after a meal is generally 120s   A little diarrhea from metformin  Not too much of a problem   Is on weight watchers - eating better  So far has lost 10 lb !!! -- has been doing it 3-4 weeks  Wt is down 4 lb Is starting exercise now - for couple of weeks gyn 2 d per week and working outside  Will start swimming    bp 118/78 - great  No headache or cp  No edema  No med side eff   Hope trig will come down with better sugar  Past Medical History  Diagnosis Date  . Allergy     allegic rhinitis  . Anxiety   . GERD (gastroesophageal reflux disease)   . Hypertension   . Hyperlipidemia   . Seizure disorder   . RLS (restless legs syndrome)   . Arthritis   . Diverticulosis   . GI bleed   . Cataract   . Cancer 02/2009    breast CA L invasive ductal CA(hormone receptor positive.)  . Salmonella 05/2000    renal effects secondary to dehydration  . Hiatal hernia   . Melena 01/2008    anemia transfusion  . Esophageal stricture 01/2008   Past Surgical History  Procedure Date  . Abdominal hysterectomy 1983  . Ovary surgery 1986    ovarian tumor removed  . Breast surgery 02/2009    breast biopsy invasive ductal CA  . Mastectomy 04/2009    Bilateral for ductal carcinoma on L    reports that she has  never smoked. She does not have any smokeless tobacco history on file. She reports that she does not drink alcohol or use illicit drugs. family history includes Cancer in her father and sisters; Diabetes in her father and sister; Heart disease in her father; and Hypertension in her brother. Allergies  Allergen Reactions  . Penicillins     REACTION: unspecified       Review of Systems Review of Systems  Constitutional: Negative for fever, appetite change, fatigue and unexpected weight change.  Eyes: Negative for pain and visual disturbance.  Respiratory: Negative for cough and shortness of breath.   Cardiovascular: Negative.   Gastrointestinal: Negative for nausea, diarrhea and constipation.  Genitourinary: Negative for urgency and frequency.  Skin: Negative for pallor.  Neurological: Negative for weakness, light-headedness, numbness and headaches.  MSK pos for joint pain and swelling  Hematological: Negative for adenopathy. Does not bruise/bleed easily.  Psychiatric/Behavioral: Negative for dysphoric mood. The patient is not nervous/anxious.          Objective:   Physical Exam  Constitutional: She appears well-developed and  well-nourished.       overwt and well appearing   HENT:  Head: Normocephalic and atraumatic.  Eyes: Conjunctivae and EOM are normal. Pupils are equal, round, and reactive to light.  Neck: Neck supple. No JVD present. Carotid bruit is not present. No thyromegaly present.  Cardiovascular: Normal rate, regular rhythm and normal heart sounds.   Pulmonary/Chest: Effort normal and breath sounds normal. She has no rales.  Abdominal: Soft. Bowel sounds are normal. She exhibits no mass. There is no tenderness.  Musculoskeletal: She exhibits tenderness. She exhibits no edema.       R lat wrist and proximal forearm is swollen and tender Some old ecchymosis Nl rom - pain to flex medially  Nl perfusion No deformity   Lymphadenopathy:    She has no cervical  adenopathy.  Neurological: She is alert. She has normal reflexes. No cranial nerve deficit or sensory deficit. Coordination normal.  Skin: Skin is warm and dry. No rash noted. No erythema. No pallor.  Psychiatric: She has a normal mood and affect.          Assessment & Plan:

## 2010-07-18 NOTE — Assessment & Plan Note (Signed)
Sugars much improved with wt watchers/ DM diet/ wt loss and metformin Lab planned end of may Expect much imp on a1c Enc to exercise and keep up the good work

## 2010-07-18 NOTE — Assessment & Plan Note (Signed)
God control with current lifestyle change and med F/u 6 mo

## 2010-07-18 NOTE — Patient Instructions (Signed)
I am thrilled with your progress - in terms of sugar control and weight -- so far Keep up the good work  No change in medicines  Schedule fating labs for the end of may  Follow up with me in 6 months

## 2010-07-18 NOTE — Assessment & Plan Note (Signed)
Fall with pain to R arm and wrist - and swelling  X ray today Recommend ice / relative rest  Full rom with some pain

## 2010-07-25 ENCOUNTER — Encounter (INDEPENDENT_AMBULATORY_CARE_PROVIDER_SITE_OTHER): Payer: Self-pay | Admitting: Surgery

## 2010-07-25 DIAGNOSIS — E119 Type 2 diabetes mellitus without complications: Secondary | ICD-10-CM

## 2010-07-25 HISTORY — DX: Type 2 diabetes mellitus without complications: E11.9

## 2010-08-02 NOTE — Discharge Summary (Signed)
Valerie Ball, Valerie Ball              ACCOUNT NO.:  [id number]   MEDICAL RECORD NO.:  [id number]          PATIENT TYPE:  INP   LOCATION:  1532                         FACILITY:  [facility]   PHYSICIAN:  Rosalyn Gess. Norins, MD  DATE OF BIRTH:  1945/05/22   DATE OF ADMISSION:  02/03/2008  DATE OF DISCHARGE:  02/05/2008                               DISCHARGE SUMMARY   ADMISSION DIAGNOSIS:  Melena, lightheadedness and weakness.   DISCHARGE DIAGNOSES:  1. Anemia and melena, resolved.  2. Urinary tract infection..  3. Hypertension.  4. Diabetes is stable.  5. Hyperlipidemia.  6. History of seizure disorder.  7. Restless leg syndrome.   CONSULTANTS:  Malcolm T. Russella Dar, MD and Hedwig Morton. Juanda Chance, MD for GI  service.   PROCEDURES:  Upper endoscopy performed February 04, 2008 by Dr. Melvia Heaps which revealed an esophageal stricture that was dilated.  Otherwise, exam was normal with no sign of active bleeding or  ulceration.   HISTORY OF PRESENT ILLNESS:  The patient is a 65 year old Caucasian  female followed by Dr. [name] at [facility].  She  presented on February 03, 2008 reporting history of nausea and vomiting  on Saturday, February 01, 2008 with coffee-ground colored emesis.  On  the day of admission, she noticed dark tarry stools.  The patient does  not use NSAIDs or anti-inflammatory drugs.  She denied any chest pain or  shortness of breath.  She did complain of weakness, dizziness, lower  abdominal cramping and pain.  Because of her symptoms and with a  hemoglobin of 9.4, she was admitted for further evaluation for possible  upper GI bleed.   Please see the H&P for past medical history, family history, social  history, examination.   LABORATORY:  At admission with a hemoglobin of 9.4, white count 5600.  Chemistries were normal.  Lipase was normal.  LFTs were normal.  Stool  was positive for blood.  Final laboratory:  Hemoglobin 10.3, hematocrit  30.7 on day of  discharge.  On February 04, 2008, chemistries were  unremarkable with a creatinine 0.8.  H. pylori antibody was negative.   HOSPITAL COURSE:  The patient was admitted.  She was seen in  consultation by the GI service.  She did go to EGD with results as  noted.  The patient did receive 2 unit transfusion because of her  symptoms and decreased blood count.  The patient following her study had  no signs of active GI bleeding.  Her hemoglobin remained stable after  transfusion going to 9.6 and to 9.3.  There were no signs of active  bleeding during her hospital stay.  The patient was able to take a diet.   With the patient being stable with no signs of active bleeding, it was  felt the patient would be stable to return home.   PHYSICAL EXAMINATION:  VITAL SIGNS:  Temperature 98.2, blood pressure  110/71, heart rate 69, respirations 18.  GENERAL APPEARANCE:  A well-  nourished, well-developed woman lying in bed eating breakfast in no  acute distress.  HEENT:  Unremarkable.  CARDIOVASCULAR:  2+ radial pulse.  Her precordium was quiet.  She had a  regular rate and rhythm.  ABDOMEN:  Bowel sounds were positive.  There was no guarding, no  rebound.  There was no significant tenderness to deep palpation.  No  further examination conducted.   DISPOSITION:  The patient is discharged home.   DISCHARGE INSTRUCTIONS:  1. She will continue on all of her home medications.  2. She will increase her proton pump inhibitor to b.i.d. dosing using      omeprazole 40 mg.  3. The patient is to have a hemoglobin/hematocrit drawn on Friday,      February 07, 2008 at [facility] and will      arrange for an appointment with Dr. [name] at the first of the      following week either February 10, 2008 or February 11, 2008.   DISCHARGE MEDICATIONS:  1. Enalapril 20 mg daily.  2. Omeprazole increased to 40 mg b.i.d.  3. Sertraline 100 mg daily.  4. Zetia 10 mg daily.  5.  Hydrochlorothiazide 25 mg daily.  6. Zyrtec 10 mg as needed.  7. Omega 3  daily.  8. Multivitamin daily.  9. Calcium daily.  10.Requip 1 mg q.h.s.  11.Flonase 2 sprays to each nostril daily.  12.Naproxen will be held.  13.Tylenol can be used for pain relief.   CONDITION ON DISCHARGE:  Stable and improved.      Rosalyn Gess Norins, MD  Electronically Signed     MEN/MEDQ  D:  02/05/2008  T:  02/05/2008  Job:  [id number]   cc:   Marne A. Tower, MD  [address] [place], [place] [address]

## 2010-08-05 NOTE — Discharge Summary (Signed)
Valerie Ball. [facility]  Patient:    Valerie Ball, Valerie Ball                       MRN: [id number] Adm. Date:  05/17/00 Disc. Date: 05/22/00 Attending:  Yvette Rack. Dorna Bloom, M.D. CC:         Valerie Ball. Caryn Section, M.D.   Discharge Summary  DISCHARGE DIAGNOSES: 1. Diarrhea, salmonella poisoning. 2. Hypertension. 3. Renal insufficiency. 4. Hypokalemia. 5. Allergic rhinitis. 6. Sinusitis. 7. Dehydration.  DISCHARGE MEDICATIONS: 1. Cipro 500 mg b.i.d. x 4 more days with a total of 5 days. 2. Norvasc 5 mg q.d. 3. The patient will hold Vasotec and will hold hydrochlorothiazide. 4. The patient also has Zoloft 50 or 100 mg a day at home which she may    resume. 5. Zyrtec 10 mg a day at home which she will resume as well.  HOSPITAL COURSE:  [name] was admitted for syncope on May 17, 2000 and found to be hypotensive at systolic blood pressure of about 70.  Prior to this, she had about four to five days of consecutive of vomiting and diarrhea. No bloody stool and no abdominal pain, and possibly a slight fever.  The patient was resuscitated with IV fluids; however, during this time, she had significant renal insufficiency with BUN in the 40s and a creatinine of about 6.9.  Nephrology, Dr. Wilber Ball. Caryn Section, was consulted.  The patients renal function subsequently improved upon discontinuation of  hydrochlorothiazide and the Vasotec.  Because of the patients significant renal insufficiency and higher blood pressure, consideration was given to renal artery stenosis.  The patient, unfortunately, is claustrophobic and was unable to have a MRA of the kidneys done.  In addition, renal dopplers were not able to be performed in the [facility].  The patient will have this addressed as an outpatient.  The patients renal function typically improved after hydration and BUN and creatinine went down to baseline.  During this time, the patient had hypokalemia probably  secondary to previous hydrochlorothiazide secondary to diarrhea.  The patient had negative blood cultures.  Stool cultures showed to be salmonella which was reported to the health department.  ONP and C. difficile toxins were negative.  During this time, the patients blood pressure returned to high and Catapres was started.  However, he developed scalp itching with the Catapres.  Also, the patient had return of sinus problems and Allegra was started.  The patient will have Zyrtec at home as an outpatient.  The patient also may resume Zoloft as an outpatient, although this was held during this admission.  She will take this for depression.  For right now, the plan is to hold Vasotec until we could verify whether she had renal artery stenosis.  We will also hold hydrochlorothiazide as a possibly dehydrating medicine, especially if we could get blood pressure well controlled with Norvasc.  This will be started at 5 mg a day and the patient will see [place] back in the offices in one to two weeks.  As an outpatient, the patient should also have renal artery dopplers done to evaluate for renal artery stenosis.  On physical exam, however, there were no bruits to be heard.DD:  05/22/00 TD:  05/22/00 Job: [id number]

## 2010-08-05 NOTE — H&P (Signed)
[place]. [facility]  Patient:    Valerie Ball, Valerie Ball                       MRN: [id number] Adm. Date:  05/17/00 Attending:  Ann Maki T. Stoneking, M.D.                         History and Physical  IDENTIFYING DATA:  Mrs. Odonell is a very nice 65 year old white female.  She has had a past history of hypertension treated since 1982.  She states she was in her usual health until this past December.  At that time developed an illness presenting with weakness.  She was discharged with pneumonia; however, stated she really had no congestion or cough.  Then, over the past four weeks she has had increasing elevation of blood pressure requiring increasing of her Vasotec dosage, and then two weeks ago a diuretic was added.  Three days ago she developed nausea, vomiting, diarrhea, and fever.  This has continued.  She has had little p.o. intake.  She has been trying to drink fluids.  This evening, at about midnight, she passed out on the toilet.  She is still having watery diarrhea, as many as 10-20 stools a day.  She has had no blood in her stool.  PAST MEDICAL HISTORY:  ALLERGIES:  PENICILLIN caused a rash.  CURRENT MEDICATIONS: 1. Vasotec 10 mg b.i.d. 2. Premarin - she believes 0.3 mg a day. 3. Hydrochlorothiazide 25 mg a day. 4. Zoloft 100 mg a day.  PAST MEDICAL HISTORY:  Remarkable for hypertension diagnosed 1982, history of CMV back in 1996.  No history of diabetes, cancer, coronary artery disease, or tuberculosis.  PREVIOUS SURGERY:  She has had a vaginal hysterectomy, one ovary removed for abnormal menstrual bleeding.  FAMILY HISTORY:  Two sisters have breast cancer.  Father had laryngeal cancer. Mother had hypertension.  No history of diabetes.  SOCIAL HISTORY:  She is married, has three grown children.  Does not smoke, does not consume alcohol on a regular basis.  Works for [employer].  REVIEW OF SYSTEMS:  No headache, no change in vision or hearing, no  trouble chewing or swallowing.  No cough, no shortness of breath, no chest pain.  GI - please see HPI.  No joint discomfort.  PHYSICAL EXAMINATION:  VITAL SIGNS:  Blood pressure initially 70, then after IV hydration systolic pressure up to 93.  Respiratory rate 20, temperature 96.  SKIN:  Warm and dry.  HEENT:  Pupils are equal, round and reactive to light.  Disks sharp, vasculature normal.  TMs normal.  Oropharynx moist although lips somewhat dry.  LUNGS:  Clear anteriorly.  HEART:  Regular rate and rhythm without murmur.  ABDOMEN:  Soft, no hepatosplenomegaly or mass palpated.  LABORATORY DATA:  Sodium 134, potassium 3.4 - repeated 2.9, chloride 104, bicarb 21, creatinine 8.0 - repeated 7.4, BUN 40, glucose 153.  A pH was 7.38, PCO2 33.  White count 7200, hemoglobin 11.8.  EKG revealed prolonged Q-T interval.  Chest x-ray pending.  Repeat BMET pending.  ASSESSMENT: 1. Prolonged diarrheal illness.  She does not appear septic.  Suspect severe    dehydration, question viral versus bacterial. 2. Hypertension with recent worsening control with elevated creatinine,    question renal artery stenosis. 3. Elevated creatinine, question accuracy of value.  Question due to    dehydration although some inconsistencies as her oral mucosa is fairly dry,  her BUN is not significantly high.  However, wonder about the combination    of dehydration, Vasotec, and the possibility of renal artery stenosis or    intrinsic renal disease. 4. Hypotension secondary to dehydration and secondary syncope.  PLAN:  Will admit, will repeat the BMET, will hydrate, will check a UA, stool for culture, and check a renal ultrasound. DD:  05/17/00 TD:  05/17/00 Job: [id number]

## 2010-08-05 NOTE — Consult Note (Signed)
[place]. [facility]  Patient:    Valerie Ball, Valerie Ball                       MRN: [id number] Proc. Date: 05/17/00 Adm. Date:  [id number] Attending:  Ginette Otto Dictator:   Myles Rosenthal, M.D.                          Consultation Report  REASON FOR CONSULTATION:  Acute renal failure.  HISTORY OF PRESENT ILLNESS:  This is a 65 year old, white female admitted after a 3-4 day history of nausea, vomiting and diarrhea, with progressive weakness and dizziness when standing which progressed to a syncopal episode after defecating this past evening.  She has no history of renal disease but has had difficulty controlling hypertension, particularly so for the past 2-3 months.  She also had a bout of pneumonia in December following a trip to the [place].  She has had her Vasotec dosage increased approximately four weeks ago after elevated blood pressures, with addition of diuretic two weeks after that.  She states that she was feeling well on Saturday prior to admission and before onset of headache, fever, nausea, vomiting, and incessant diarrhea. She is still having some fever, nausea, vomiting, and continues to have diarrhea.  She states she has had very poor p.o. intake for the past several days.  She also notes that she has had very decreased urine output for the past few days and has lost 12 pounds in this period.  She denies any dysuria or difficulty with urinating.  PAST MEDICAL HISTORY:  1. Hypertension x 20 years.  2. CMV illness with question of pancytopenia in 1996.  3. Status post TAH with one oophorectomy.  4. Negative for diabetes and coronary artery disease.  ALLERGIES:  PENICILLIN.  MEDICATIONS:  1. Vasotec 10 mg b.i.d.  2. Premarin 0.625 mg 0.3 mg p.o. q.d.  3. HCTZ 25 mg p.o. q.d.  4. Zoloft 100 mg p.o. q.d.  FAMILY HISTORY:  There is no renal disease noted.  She does have diabetes in both her father and sister.  She also has a sister  with breast cancer.  SOCIAL HISTORY:  She is married.  Lives in [place] with her husband and son.  She has never used tobacco and is occasional alcohol drinker.  Denies any illicit drug use.  She works [employer].  PHYSICAL EXAMINATION:  VITAL SIGNS:  Temperature 96.0, blood pressure 72/42 which improved to 134/68 after hydration.  Pulse 92.  GENERAL:  This is a moderately ill-appearing white female lying on the gurney, alert and oriented x 4.  HEENT:  Normocephalic, atraumatic.  Pupils are equal, round, and reactive to light.  Extraocular muscles are intact.  She does have dry mucous membranes without lymphadenopathy.  CHEST:  Clear to auscultation.  No rales or wheezes noted.  CARDIAC:  Regular rate and rhythm with a 2/6 systolic murmur at left lateral sternal border.  She does have somewhat delayed capillary refill.  ABDOMEN:  Soft and nontender, nondistended, without rebound or guarding. There are no bruits noted.  No masses noted.  She does have positive bowel sounds.  EXTREMITIES:  Without edema.  There is no rash present.  LABORATORY ON ADMISSION:  Sodium 134, potassium 2.7, chloride 103, CO2 23, b.. 43, creatinine 6.3, glucose 267.  This was at 6:42 a.m.  At 2:32 a.m., BUN and creatinine were 40/7.4 prior to hydration.  White count  4.8 without eosinophilia.  Hemoglobin 10.5 (11.8 prior to hydration), platelets 208.  Renal ultrasound within normal limits.  No hydronephrosis.  Chest x-ray is normal - no acute disease.  IMPRESSION AND PLAN:  1. Acute renal failure.  There was reportedly normal renal function at     baseline, now with increased creatinines after gastrointestinal illness     with nausea, vomiting, diarrhea, and decreased p.o. intake and decreased     urine output over the past few days.  This is likely prerenal secondary to     hypotension, but appears to be getting slightly better with IV fluids     overnight.  There is concern for acute tubular  necrosis secondary to     decreased blood pressure and that remains and we will continue to watch     her closely.  There is also concern of renal artery stenosis given her     increased creatinine after an increase in blood pressure medications,     particularly with ACE inhibitor in the setting when increasingly difficult     to control hypotension.  Will agree with magnetic resonance angiography to     evaluate renal arteries.  2. Hypothyroidism secondary to gastrointestinal losses.  Hold     antihypertensive medicines.  Continue intravenous fluid support.  This     appears to have resolved at this time.  3. Question of diabetes.  Consider adding a hemoglobin A1C to check for     long-standing hyperglycemia.  This also could be secondary to stress     response.  4. Gastrointestinal - Agree with sending stool studies and supporting with IV     fluids.  Will need to replete potassium secondary to gastrointestinal     losses. DD:  05/17/00 TD:  05/17/00 Job: [id number]

## 2010-08-12 ENCOUNTER — Other Ambulatory Visit (INDEPENDENT_AMBULATORY_CARE_PROVIDER_SITE_OTHER): Payer: 59 | Admitting: Family Medicine

## 2010-08-12 DIAGNOSIS — I10 Essential (primary) hypertension: Secondary | ICD-10-CM

## 2010-08-12 DIAGNOSIS — E119 Type 2 diabetes mellitus without complications: Secondary | ICD-10-CM

## 2010-08-12 DIAGNOSIS — E785 Hyperlipidemia, unspecified: Secondary | ICD-10-CM

## 2010-08-12 LAB — RENAL FUNCTION PANEL
Calcium: 9.1 mg/dL (ref 8.4–10.5)
Creatinine, Ser: 0.9 mg/dL (ref 0.4–1.2)
Glucose, Bld: 112 mg/dL — ABNORMAL HIGH (ref 70–99)
Phosphorus: 4.3 mg/dL (ref 2.3–4.6)
Sodium: 141 mEq/L (ref 135–145)

## 2010-08-12 LAB — HEMOGLOBIN A1C: Hgb A1c MFr Bld: 6.2 % (ref 4.6–6.5)

## 2010-08-12 LAB — LIPID PANEL
Cholesterol: 200 mg/dL (ref 0–200)
Total CHOL/HDL Ratio: 5

## 2010-08-12 LAB — LDL CHOLESTEROL, DIRECT: Direct LDL: 125 mg/dL

## 2010-08-29 NOTE — Progress Notes (Signed)
Patient notified. Will call back later to schedule labs.

## 2010-11-07 ENCOUNTER — Telehealth: Payer: Self-pay | Admitting: *Deleted

## 2010-11-07 MED ORDER — SERTRALINE HCL 100 MG PO TABS: 100.0000 mg | ORAL_TABLET | Freq: Every day | ORAL | Status: DC

## 2010-11-07 MED ORDER — ENALAPRIL MALEATE 20 MG PO TABS: 20.0000 mg | ORAL_TABLET | Freq: Every day | ORAL | Status: DC

## 2010-11-07 NOTE — Telephone Encounter (Signed)
Thank you let me know if I need to do anything  

## 2010-11-07 NOTE — Telephone Encounter (Signed)
Pt had an issue with [facility] sending out her rx's so she request a 2 week supply sent to [facility]. I sent rx's electronically.

## 2010-11-14 ENCOUNTER — Other Ambulatory Visit: Payer: Self-pay

## 2010-11-14 MED ORDER — METFORMIN HCL 500 MG PO TABS: 500.0000 mg | ORAL_TABLET | Freq: Two times a day (BID) | ORAL | Status: DC

## 2010-11-14 NOTE — Telephone Encounter (Signed)
Completed form faxed to [facility] [phone] as instructed.

## 2010-11-14 NOTE — Telephone Encounter (Signed)
Form done in IN box 

## 2010-11-14 NOTE — Telephone Encounter (Signed)
[facility] faxed form for 90 day supply with instructions that are signed. Form is on your shelf in the in box.

## 2010-11-22 ENCOUNTER — Inpatient Hospital Stay (INDEPENDENT_AMBULATORY_CARE_PROVIDER_SITE_OTHER)
Admission: RE | Admit: 2010-11-22 | Discharge: 2010-11-22 | Disposition: A | Discharge status: Home / Self Care | Payer: 59 | Source: Ambulatory Visit | Attending: Family Medicine | Admitting: Family Medicine

## 2010-11-22 DIAGNOSIS — S93609A Unspecified sprain of unspecified foot, initial encounter: Secondary | ICD-10-CM

## 2010-11-22 DIAGNOSIS — M799 Soft tissue disorder, unspecified: Secondary | ICD-10-CM

## 2010-12-21 LAB — BASIC METABOLIC PANEL
CO2: 27
Chloride: 106
Creatinine, Ser: 0.8
GFR calc Af Amer: >60
Sodium: 141

## 2010-12-21 LAB — CROSSMATCH: Antibody Screen: NEGATIVE

## 2010-12-21 LAB — LIPASE, BLOOD: Lipase: 25

## 2010-12-21 LAB — ABO/RH: ABO/RH(D): O NEG

## 2010-12-21 LAB — COMPREHENSIVE METABOLIC PANEL
ALT: 34
Alkaline Phosphatase: 66
CO2: 27
Calcium: 9
GFR calc non Af Amer: >60
Glucose, Bld: 94
Potassium: 3.6
Sodium: 140

## 2010-12-21 LAB — DIFFERENTIAL
Basophils Absolute: 0
Basophils Relative: 0
Eosinophils Absolute: 0
Neutrophils Relative %: 65

## 2010-12-21 LAB — PROTIME-INR
INR: 1
Prothrombin Time: 13.2

## 2010-12-21 LAB — URINALYSIS, ROUTINE W REFLEX MICROSCOPIC
Bilirubin Urine: NEGATIVE
Ketones, ur: NEGATIVE
Nitrite: NEGATIVE
Protein, ur: NEGATIVE
Urobilinogen, UA: 0.2
pH: 5.5

## 2010-12-21 LAB — APTT: aPTT: 26

## 2010-12-21 LAB — CBC
Hemoglobin: 10.6 — ABNORMAL LOW
Hemoglobin: 9.4 — ABNORMAL LOW
MCHC: 33.8
MCHC: 34.3
MCV: 84.6
RBC: 3.33 — ABNORMAL LOW
RBC: 3.64 — ABNORMAL LOW

## 2010-12-21 LAB — URINE MICROSCOPIC-ADD ON

## 2010-12-21 LAB — URINE CULTURE: Colony Count: 30000

## 2010-12-24 ENCOUNTER — Other Ambulatory Visit: Payer: Self-pay | Admitting: Family Medicine

## 2011-01-10 ENCOUNTER — Other Ambulatory Visit: Payer: 59

## 2011-01-13 ENCOUNTER — Other Ambulatory Visit (INDEPENDENT_AMBULATORY_CARE_PROVIDER_SITE_OTHER): Payer: 59

## 2011-01-13 DIAGNOSIS — E7800 Pure hypercholesterolemia, unspecified: Secondary | ICD-10-CM

## 2011-01-13 DIAGNOSIS — I10 Essential (primary) hypertension: Secondary | ICD-10-CM

## 2011-01-13 DIAGNOSIS — E119 Type 2 diabetes mellitus without complications: Secondary | ICD-10-CM

## 2011-01-13 LAB — RENAL FUNCTION PANEL
Albumin: 4.2 g/dL (ref 3.5–5.2)
BUN: 20 mg/dL (ref 6–23)
CO2: 27 mEq/L (ref 19–32)
Chloride: 106 mEq/L (ref 96–112)

## 2011-01-13 LAB — LIPID PANEL
HDL: 36.6 mg/dL — ABNORMAL LOW (ref >39.00)
VLDL: 74.4 mg/dL — ABNORMAL HIGH (ref 0.0–40.0)

## 2011-01-13 LAB — AST: AST: 42 U/L — ABNORMAL HIGH (ref 0–37)

## 2011-01-13 LAB — ALT: ALT: 30 U/L (ref 0–35)

## 2011-01-17 ENCOUNTER — Ambulatory Visit (INDEPENDENT_AMBULATORY_CARE_PROVIDER_SITE_OTHER): Payer: 59 | Admitting: Family Medicine

## 2011-01-17 ENCOUNTER — Encounter: Payer: Self-pay | Admitting: Family Medicine

## 2011-01-17 VITALS — BP 126/82 | HR 76 | Temp 98.4°F | Ht 64.5 in | Wt 161.8 lb

## 2011-01-17 DIAGNOSIS — I10 Essential (primary) hypertension: Secondary | ICD-10-CM

## 2011-01-17 DIAGNOSIS — E119 Type 2 diabetes mellitus without complications: Secondary | ICD-10-CM

## 2011-01-17 DIAGNOSIS — E785 Hyperlipidemia, unspecified: Secondary | ICD-10-CM

## 2011-01-17 DIAGNOSIS — Z23 Encounter for immunization: Secondary | ICD-10-CM

## 2011-01-17 MED ORDER — SIMVASTATIN 10 MG PO TABS: 5.0000 mg | ORAL_TABLET | Freq: Every day | ORAL | Status: DC

## 2011-01-17 NOTE — Assessment & Plan Note (Addendum)
On zetia currently and not at goal Will bring back very small dose of zocor- watching for side eff and liver changes Disc goals for lipids and reasons to control them Rev labs with pt Rev low sat fat diet in detail  Lab in 6 weeks

## 2011-01-17 NOTE — Patient Instructions (Signed)
Flu shot today  Don't forget to make your annual eye doctor appt  Start back on zocor  1/2 of a 10 mg pill once daily  Schedule fasting lab in 6 weeks for cholesterol and liver function  Schedule PE in 6 months with labs prior

## 2011-01-17 NOTE — Assessment & Plan Note (Signed)
Overall fairly stable despite inability lately to exercise Lab Results  Component Value Date   HGBA1C 6.6* 01/13/2011   rev low glycemic diet  No change in metformin  Will sched own opthy appt

## 2011-01-17 NOTE — Progress Notes (Signed)
Subjective:    Patient ID: Valerie Ball, female    DOB: 16-Mar-1946, 65 y.o.   MRN: [id number]  HPI Here for f/u of HTN and DM and fatty liver and hyperlipidemia  Has been doing ok overall  Not sleeping well due to the breast cancer meds  Will see oncol on Friday - overall doing well with that Is unhappy with her reconstruction, however   Flu shot- wants to get today   HTN in good control 126/82 No cp or palpitation or ha No change in med or side eff  Gluc 106 and a1c is 6.6-- up a bit  1 mo ago pulled a ligament on top of her foot -- and was unable to exercise for a while  Was putting down tile on her floor Is getting better On metformin -- no changes  opthy- is due for that - she will make her own appt  Diet- has been fair - is difficult when her husband is home more - he is not eating very well (stress)  Sugars - have been about the same -- fasting about 126 Wt is up 1 lb with bmi of 27  Exercise - is almost ready to start back , 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   Lipids Lab Results  Component Value Date   CHOL 209* 01/13/2011   CHOL 200 08/12/2010   CHOL 201* 05/03/2010   Lab Results  Component Value Date   HDL 36.60* 01/13/2011   HDL 40.40 08/12/2010   HDL 40.98* 05/03/2010   No results found for this basename: LDLCALC   Lab Results  Component Value Date   TRIG 372.0* 01/13/2011   TRIG 399.0* 08/12/2010   TRIG 471.0* 05/03/2010   Lab Results  Component Value Date   CHOLHDL 6 01/13/2011   CHOLHDL 5 08/12/2010   CHOLHDL 7 05/03/2010   Lab Results  Component Value Date   LDLDIRECT 132.4 01/13/2011   LDLDIRECT 125.0 08/12/2010   LDLDIRECT 118.2 05/03/2010   trig still high - but improved  Is off the zocor and now on zetia  Needs to go back on zocor  Diet - not as good as it was   Patient Active Problem List  Diagnoses  . BREAST CANCER  . DIABETES MELLITUS, TYPE II  . HYPERLIPIDEMIA  . UNSPECIFIED ANEMIA  . ANXIETY  . DISORDERS, ORGANIC SLEEP NEC  .  SYNDROME, RESTLESS LEGS  . SYNDROME, CARPAL TUNNEL  . HYPERTENSION  . ALLERGIC RHINITIS  . ESOPHAGEAL STRICTURE  . GERD  . HIATAL HERNIA  . DIVERTICULOSIS-COLON  . PERITONEAL ADHESIONS  . FATTY LIVER DISEASE  . SEIZURE DISORDER  . TRANSAMINASES, SERUM, ELEVATED  . ADVERSE DRUG REACTION  . Wrist pain  . Diabetes mellitus without mention of complication   Past Medical History  Diagnosis Date  . Allergy     allegic rhinitis  . Anxiety   . GERD (gastroesophageal reflux disease)   . Hypertension   . Hyperlipidemia   . Seizure disorder   . RLS (restless legs syndrome)   . Arthritis   . Diverticulosis   . GI bleed   . Cataract   . Cancer 02/2009    breast CA L invasive ductal CA(hormone receptor positive.)  . Salmonella 05/2000    renal effects secondary to dehydration  . Hiatal hernia   . Melena 01/2008    anemia transfusion  . Esophageal stricture 01/2008  . Borderline diabetic     per patient medical history form  .  Menopause     per patient medical history form  . Postmenopausal hormone therapy     per patient medical history form.   Past Surgical History  Procedure Date  . Abdominal hysterectomy 1983  . Ovary surgery 1986    ovarian tumor removed  . Breast surgery 02/2009    breast biopsy invasive ductal CA  . Mastectomy 04/2009    Bilateral for ductal carcinoma on L   History  Substance Use Topics  . Smoking status: Never Smoker   . Smokeless tobacco: Not on file  . Alcohol Use: No   Family History  Problem Relation Age of Onset  . Heart disease Father     MI  . Cancer Father     esophageal CA  . Diabetes Father   . Cancer Sister     breast CA  . Diabetes Sister   . Hypertension Brother   . Diabetes Brother   . Cancer Sister     breast CA  . Hypertension Mother   . Stroke Mother   . Hypertension Sister    Allergies  Allergen Reactions  . Penicillins     REACTION: unspecified   Current Outpatient Prescriptions on File Prior to Visit    Medication Sig Dispense Refill  . alendronate (FOSAMAX) 70 MG tablet Take 70 mg by mouth every 7 (seven) days. Take with a full glass of water on an empty stomach.       . ALPRAZolam (XANAX) 0.5 MG tablet Take 0.5 mg by mouth 3 (three) times daily as needed. for anxiety. Careful of sedation.       . Cholecalciferol (VITAMIN D-3 PO) Take 1 tablet by mouth daily.        . enalapril (VASOTEC) 20 MG tablet Take 1 tablet (20 mg total) by mouth daily.  14 tablet  0  . ezetimibe (ZETIA) 10 MG tablet Take 10 mg by mouth daily.        Marland Kitchen glucose blood test strip Check glucose two times a day and as needd for DM 2 250.0       . hydrochlorothiazide 25 MG tablet Take 25 mg by mouth daily.        . Lancets MISC Check glucose two times a day and as needed for DM 2 250.0       . metFORMIN (GLUCOPHAGE) 500 MG tablet Take 1 tablet (500 mg total) by mouth 2 (two) times daily with a meal.  180 tablet  3  . Multiple Vitamin (MULTIVITAMIN) capsule Take 1 capsule by mouth daily.        Marland Kitchen omeprazole (PRILOSEC) 20 MG capsule Take 1 capsule (20 mg total) by mouth 2 (two) times daily.  180 capsule  3  . rOPINIRole (REQUIP) 1 MG tablet TAKE 1 TABLET AT BEDTIME  90 tablet  2  . sertraline (ZOLOFT) 100 MG tablet Take 1 tablet (100 mg total) by mouth daily.  14 tablet  0  . cetirizine (ZYRTEC) 10 MG tablet Take 10 mg by mouth daily as needed.        . fluticasone (FLONASE) 50 MCG/ACT nasal spray 2 sprays by Nasal route daily as needed.        Ailene Ards 3-6-9 Fatty Acids (OMEGA-3-6-9 PO) Take 1 capsule by mouth daily.             Review of Systems Review of Systems  Constitutional: Negative for fever, appetite change, fatigue and unexpected weight change.  Eyes: Negative for pain and visual  disturbance.  Respiratory: Negative for cough and shortness of breath.   Cardiovascular: Negative for cp or palpitations    Gastrointestinal: Negative for nausea, diarrhea and constipation.  Genitourinary: Negative for urgency and  frequency. no excessive thirst  Skin: Negative for pallor or rash   MSK pos for general aches and pains (were not worse with statin) Neurological: Negative for weakness, light-headedness, numbness and headaches.  Hematological: Negative for adenopathy. Does not bruise/bleed easily.  Psychiatric/Behavioral: Negative for dysphoric mood. The patient is not nervous/anxious.          Objective:   Physical Exam  Constitutional: She appears well-developed and well-nourished. No distress.  HENT:  Head: Normocephalic and atraumatic.  Mouth/Throat: Oropharynx is clear and moist.  Eyes: Conjunctivae and EOM are normal. Pupils are equal, round, and reactive to light. No scleral icterus.  Neck: Normal range of motion. Neck supple. No JVD present. Carotid bruit is not present. No thyromegaly present.  Cardiovascular: Normal rate, regular rhythm, normal heart sounds and intact distal pulses.  Exam reveals no gallop.   Pulmonary/Chest: Effort normal and breath sounds normal. No respiratory distress. She has no wheezes.  Abdominal: Soft. Bowel sounds are normal. She exhibits no distension, no abdominal bruit and no mass. There is no tenderness.  Genitourinary:       Breast implants are assymetric - larger on L  No nipples at this time  Musculoskeletal: Normal range of motion. She exhibits no edema and no tenderness.  Lymphadenopathy:    She has no cervical adenopathy.  Neurological: She is alert. She has normal reflexes. No cranial nerve deficit. Coordination normal.  Skin: Skin is warm and dry. No rash noted. No erythema. No pallor.  Psychiatric: She has a normal mood and affect.          Assessment & Plan:

## 2011-01-20 ENCOUNTER — Other Ambulatory Visit: Payer: Self-pay | Admitting: Oncology

## 2011-01-20 ENCOUNTER — Telehealth: Payer: Self-pay | Admitting: Oncology

## 2011-01-20 ENCOUNTER — Ambulatory Visit (HOSPITAL_BASED_OUTPATIENT_CLINIC_OR_DEPARTMENT_OTHER): Payer: 59 | Admitting: Oncology

## 2011-01-20 DIAGNOSIS — M159 Polyosteoarthritis, unspecified: Secondary | ICD-10-CM

## 2011-01-20 DIAGNOSIS — C50919 Malignant neoplasm of unspecified site of unspecified female breast: Secondary | ICD-10-CM

## 2011-01-20 DIAGNOSIS — Z170 Estrogen receptor positive status [ER+]: Secondary | ICD-10-CM

## 2011-01-20 LAB — CBC WITH DIFFERENTIAL/PLATELET
Eosinophils Absolute: 0.1 10*3/uL (ref 0.0–0.5)
LYMPH%: 31.1 % (ref 14.0–49.7)
MCHC: 31.9 g/dL (ref 31.5–36.0)
MCV: 82.1 fL (ref 79.5–101.0)
MONO%: 7.1 % (ref 0.0–14.0)
NEUT#: 3.5 10*3/uL (ref 1.5–6.5)
NEUT%: 60.5 % (ref 38.4–76.8)
Platelets: 172 10*3/uL (ref 145–400)
RBC: 4.47 10*6/uL (ref 3.70–5.45)

## 2011-01-20 LAB — COMPREHENSIVE METABOLIC PANEL
Alkaline Phosphatase: 65 U/L (ref 39–117)
Creatinine, Ser: 0.88 mg/dL (ref 0.50–1.10)
Glucose, Bld: 120 mg/dL — ABNORMAL HIGH (ref 70–99)
Sodium: 141 mEq/L (ref 135–145)
Total Bilirubin: 0.4 mg/dL (ref 0.3–1.2)
Total Protein: 7.3 g/dL (ref 6.0–8.3)

## 2011-01-23 NOTE — Progress Notes (Signed)
CC:   Thomas A. Cornett, M.D. Marne A. Tower, MD  DIAGNOSIS:  A 65 year old female with stage I invasive ductal carcinoma of the left breast.  The patient is status post bilateral mastectomies in 04/2009.  CURRENT THERAPY:  Arimidex 1 mg daily.  INTERVAL HISTORY:  The patient is seen in followup today.  Overall she seems to be doing well.  She had bilateral reconstructions performed and they have healed very nicely.  She denies any fevers, chills, night sweats, headaches.  She has arthritis pain especially stiff when she wakes up in the morning, but improves as the day goes on.  She is taking naproxen and that does help her quite a bit.  The remainder of the 10 point review of systems is negative and scanned separately.  PHYSICAL EXAMINATION:  General:  The patient is awake, alert.  She appears well.  Vital signs:  Temperature is 98.2, ,pulse 82, respirations 20, blood pressure 146/91.  HEENT:  Exam is unremarkable. Lungs:  Clear.  Cardiovascular:  Regular rate and rhythm.  Abdomen: Soft, nontender, nondistended.  Bowel sounds are present.  No HSM. Extremities:  No edema.  Neuro:  The patient is alert, oriented, otherwise nonfocal.  Breasts:  Bilateral reconstructed breasts without any skin changes.  Well-healed surgical scars.  LABORATORY DATA:  WBC 5.8, hemoglobin 11.7, hematocrit 36.7, platelets are 172,000.  IMPRESSION AND PLAN:  A 65 year old female with history of stage I invasive ductal carcinoma of the left breast.  She is status post therapeutic left mastectomy with prophylactic right mastectomy for an ER/PR positive, HER2 negative breast cancer.  She is on Arimidex 1 mg daily, tolerating it very well and she will be seen back in 6 months' time.  We did discuss exercise and taking naproxen before she goes to bed for her arthritis.    ______________________________ Drue Second, M.D. KK/MEDQ  D:  01/20/2011  T:  01/23/2011  Job:  [id number]

## 2011-02-28 ENCOUNTER — Other Ambulatory Visit (INDEPENDENT_AMBULATORY_CARE_PROVIDER_SITE_OTHER): Payer: 59

## 2011-02-28 DIAGNOSIS — C50919 Malignant neoplasm of unspecified site of unspecified female breast: Secondary | ICD-10-CM

## 2011-02-28 DIAGNOSIS — E785 Hyperlipidemia, unspecified: Secondary | ICD-10-CM

## 2011-02-28 LAB — LDL CHOLESTEROL, DIRECT: Direct LDL: 85.5 mg/dL

## 2011-02-28 LAB — LIPID PANEL
HDL: 36.8 mg/dL — ABNORMAL LOW (ref >39.00)
Total CHOL/HDL Ratio: 5
Triglycerides: 368 mg/dL — ABNORMAL HIGH (ref 0.0–149.0)
VLDL: 73.6 mg/dL — ABNORMAL HIGH (ref 0.0–40.0)

## 2011-03-25 ENCOUNTER — Emergency Department (HOSPITAL_COMMUNITY): Payer: 59

## 2011-03-25 ENCOUNTER — Encounter (HOSPITAL_COMMUNITY): Payer: Self-pay | Admitting: *Deleted

## 2011-03-25 ENCOUNTER — Emergency Department (INDEPENDENT_AMBULATORY_CARE_PROVIDER_SITE_OTHER)
Admission: EM | Admit: 2011-03-25 | Discharge: 2011-03-25 | Disposition: A | Discharge status: Nursing Home (Includes ICF) | Payer: 59 | Source: Home / Self Care | Attending: Emergency Medicine | Admitting: Emergency Medicine

## 2011-03-25 ENCOUNTER — Encounter (HOSPITAL_COMMUNITY): Payer: Self-pay | Admitting: Emergency Medicine

## 2011-03-25 ENCOUNTER — Emergency Department (HOSPITAL_COMMUNITY)
Admission: EM | Admit: 2011-03-25 | Discharge: 2011-03-25 | Disposition: A | Discharge status: Home / Self Care | Payer: 59 | Attending: Emergency Medicine | Admitting: Emergency Medicine

## 2011-03-25 DIAGNOSIS — K219 Gastro-esophageal reflux disease without esophagitis: Secondary | ICD-10-CM | POA: Insufficient documentation

## 2011-03-25 DIAGNOSIS — R509 Fever, unspecified: Secondary | ICD-10-CM | POA: Insufficient documentation

## 2011-03-25 DIAGNOSIS — R569 Unspecified convulsions: Secondary | ICD-10-CM | POA: Insufficient documentation

## 2011-03-25 DIAGNOSIS — R109 Unspecified abdominal pain: Secondary | ICD-10-CM | POA: Insufficient documentation

## 2011-03-25 DIAGNOSIS — K5732 Diverticulitis of large intestine without perforation or abscess without bleeding: Secondary | ICD-10-CM | POA: Insufficient documentation

## 2011-03-25 DIAGNOSIS — I10 Essential (primary) hypertension: Secondary | ICD-10-CM | POA: Insufficient documentation

## 2011-03-25 DIAGNOSIS — E785 Hyperlipidemia, unspecified: Secondary | ICD-10-CM | POA: Insufficient documentation

## 2011-03-25 DIAGNOSIS — R197 Diarrhea, unspecified: Secondary | ICD-10-CM | POA: Insufficient documentation

## 2011-03-25 DIAGNOSIS — R110 Nausea: Secondary | ICD-10-CM | POA: Insufficient documentation

## 2011-03-25 DIAGNOSIS — K5792 Diverticulitis of intestine, part unspecified, without perforation or abscess without bleeding: Secondary | ICD-10-CM

## 2011-03-25 DIAGNOSIS — G2581 Restless legs syndrome: Secondary | ICD-10-CM | POA: Insufficient documentation

## 2011-03-25 LAB — COMPREHENSIVE METABOLIC PANEL
Alkaline Phosphatase: 71 U/L (ref 39–117)
BUN: 13 mg/dL (ref 6–23)
Chloride: 103 mEq/L (ref 96–112)
Creatinine, Ser: 0.75 mg/dL (ref 0.50–1.10)
GFR calc Af Amer: >90 mL/min (ref >90)
GFR calc non Af Amer: 88 mL/min — ABNORMAL LOW (ref >90)
Glucose, Bld: 90 mg/dL (ref 70–99)
Potassium: 4.3 mEq/L (ref 3.5–5.1)
Total Bilirubin: 0.3 mg/dL (ref 0.3–1.2)

## 2011-03-25 LAB — URINALYSIS, ROUTINE W REFLEX MICROSCOPIC
Bilirubin Urine: NEGATIVE
Glucose, UA: NEGATIVE mg/dL
Hgb urine dipstick: NEGATIVE
Ketones, ur: NEGATIVE mg/dL
Protein, ur: NEGATIVE mg/dL
pH: 6 (ref 5.0–8.0)

## 2011-03-25 LAB — CBC
HCT: 36.2 % (ref 36.0–46.0)
Hemoglobin: 11.7 g/dL — ABNORMAL LOW (ref 12.0–15.0)
MCV: 83.4 fL (ref 78.0–100.0)
WBC: 9.6 10*3/uL (ref 4.0–10.5)

## 2011-03-25 LAB — URINE MICROSCOPIC-ADD ON

## 2011-03-25 LAB — LIPASE, BLOOD: Lipase: 27 U/L (ref 11–59)

## 2011-03-25 MED ORDER — HYDROCODONE-ACETAMINOPHEN 5-500 MG PO TABS: 1.0000 | ORAL_TABLET | Freq: Four times a day (QID) | ORAL | Status: AC | PRN

## 2011-03-25 MED ORDER — ACETAMINOPHEN 325 MG PO TABS
650.0000 mg | ORAL_TABLET | Freq: Once | ORAL | Status: AC
  Administered 2011-03-25: 650 mg via ORAL
  Filled 2011-03-25: qty 2

## 2011-03-25 MED ORDER — METRONIDAZOLE 500 MG PO TABS
500.0000 mg | ORAL_TABLET | Freq: Once | ORAL | Status: AC
  Administered 2011-03-25: 500 mg via ORAL
  Filled 2011-03-25: qty 1

## 2011-03-25 MED ORDER — METRONIDAZOLE 500 MG PO TABS: 500.0000 mg | ORAL_TABLET | Freq: Three times a day (TID) | ORAL | Status: AC

## 2011-03-25 MED ORDER — FENTANYL CITRATE 0.05 MG/ML IJ SOLN
50.0000 ug | Freq: Once | INTRAMUSCULAR | Status: DC
  Filled 2011-03-25: qty 2

## 2011-03-25 MED ORDER — IOHEXOL 300 MG/ML  SOLN
100.0000 mL | Freq: Once | INTRAMUSCULAR | Status: AC | PRN
  Administered 2011-03-25: 100 mL via INTRAVENOUS

## 2011-03-25 MED ORDER — ONDANSETRON HCL 4 MG/2ML IJ SOLN
4.0000 mg | Freq: Once | INTRAMUSCULAR | Status: DC
  Filled 2011-03-25: qty 2

## 2011-03-25 MED ORDER — CIPROFLOXACIN HCL 500 MG PO TABS
500.0000 mg | ORAL_TABLET | Freq: Once | ORAL | Status: AC
  Administered 2011-03-25: 500 mg via ORAL
  Filled 2011-03-25: qty 1

## 2011-03-25 MED ORDER — CIPROFLOXACIN HCL 500 MG PO TABS: 500.0000 mg | ORAL_TABLET | Freq: Two times a day (BID) | ORAL | Status: AC

## 2011-03-25 MED ORDER — ACETAMINOPHEN 650 MG RE SUPP: 650.0000 mg | RECTAL | Status: DC | PRN

## 2011-03-25 NOTE — ED Notes (Signed)
Pt presents from urgent care center for evaluation of lower abdominal pain. She states that the pain has been progressively getting worse. She describes the pain as intermittent but sharp at times. No meds pta. Pt states that she has been having low grade fevers. No meds pta. Breath sounds are clear and bowel sounds are present. Pt was sent over for r/o appen/colitis. Pt currently drinking contrast. Iv site attempted. Pt is restricted on the left side due to breast surgery with lymph node removal. Iv team contacted for iv start. Pt advised of plan of care, resting comfortably, she stated that her pain had improved post assessment and stated that at this time no meds were needed or requested. Pa made aware and stated that if pain returned original protocol abdominal pain orders could be utilized.

## 2011-03-25 NOTE — ED Provider Notes (Signed)
History     CSN: [id number]  Arrival date & time 03/25/11  1516   First MD Initiated Contact with Patient 03/25/11 1810      Chief Complaint  Patient presents with  . Abdominal Pain    (Consider location/radiation/quality/duration/timing/severity/associated sxs/prior treatment) Patient is a 66 y.o. female presenting with abdominal pain. The history is provided by the patient.  Abdominal Pain The primary symptoms of the illness include abdominal pain, fever, nausea and diarrhea. The primary symptoms of the illness do not include shortness of breath, vomiting or dysuria. The current episode started yesterday. The onset of the illness was gradual. The problem has been gradually worsening.  Additional symptoms associated with the illness include chills and anorexia. Symptoms associated with the illness do not include constipation, urgency, hematuria, frequency or back pain.  Pt state yesterday she was not feeling well, and had multiple episodes of diarrhea. Today she is having lower abdominal pain, nausea, diarrhea has resolved. Pt went to UC, sent here for further evaluation.   Past Medical History  Diagnosis Date  . Allergy     allegic rhinitis  . Anxiety   . GERD (gastroesophageal reflux disease)   . Hypertension   . Hyperlipidemia   . Seizure disorder   . RLS (restless legs syndrome)   . Arthritis   . Diverticulosis   . GI bleed   . Cataract   . Cancer 02/2009    breast CA L invasive ductal CA(hormone receptor positive.)  . Salmonella 05/2000    renal effects secondary to dehydration  . Hiatal hernia   . Melena 01/2008    anemia transfusion  . Esophageal stricture 01/2008  . Borderline diabetic     per patient medical history form  . Menopause     per patient medical history form  . Postmenopausal hormone therapy     per patient medical history form.    Past Surgical History  Procedure Date  . Abdominal hysterectomy 1983  . Ovary surgery 1986    ovarian tumor  removed  . Breast surgery 02/2009    breast biopsy invasive ductal CA  . Mastectomy 04/2009    Bilateral for ductal carcinoma on L    Family History  Problem Relation Age of Onset  . Heart disease Father     MI  . Cancer Father     esophageal CA  . Diabetes Father   . Cancer Sister     breast CA  . Diabetes Sister   . Hypertension Brother   . Diabetes Brother   . Cancer Sister     breast CA  . Hypertension Mother   . Stroke Mother   . Hypertension Sister     History  Substance Use Topics  . Smoking status: Never Smoker   . Smokeless tobacco: Not on file  . Alcohol Use: No    OB History    Grav Para Term Preterm Abortions TAB SAB Ect Mult Living                  Review of Systems  Constitutional: Positive for fever and chills.  HENT: Negative.   Eyes: Negative.   Respiratory: Negative for chest tightness and shortness of breath.   Cardiovascular: Negative for chest pain and leg swelling.  Gastrointestinal: Positive for nausea, abdominal pain, diarrhea and anorexia. Negative for vomiting and constipation.  Genitourinary: Negative for dysuria, urgency, frequency and hematuria.  Musculoskeletal: Negative for back pain.  Skin: Negative.   Neurological: Negative.  Psychiatric/Behavioral: Negative.     Allergies  Penicillins  Home Medications   Current Outpatient Rx  Name Route Sig Dispense Refill  . ALENDRONATE SODIUM 70 MG PO TABS Oral Take 70 mg by mouth every 7 (seven) days. Take with a full glass of water on an empty stomach. Take on Sat.    . ANASTROZOLE 1 MG PO TABS Oral Take 1 tablet by mouth Daily.    Marland Kitchen VITAMIN D-3 PO Oral Take 1 tablet by mouth daily.      . ENALAPRIL MALEATE 20 MG PO TABS Oral Take 1 tablet (20 mg total) by mouth daily. 14 tablet 0  . EZETIMIBE 10 MG PO TABS Oral Take 10 mg by mouth daily.      Marland Kitchen HYDROCHLOROTHIAZIDE 25 MG PO TABS Oral Take 25 mg by mouth daily.      Marland Kitchen METFORMIN HCL 500 MG PO TABS Oral Take 500 mg by mouth 2 (two)  times daily with a meal.      . MULTIVITAMINS PO CAPS Oral Take 1 capsule by mouth daily.      Marland Kitchen NAPROXEN SODIUM 220 MG PO CAPS Oral Take 1 capsule by mouth daily as needed. For pain    . OMEGA-3-6-9 PO Oral Take 1 capsule by mouth daily.      Marland Kitchen OMEPRAZOLE 20 MG PO CPDR Oral Take 1 capsule (20 mg total) by mouth 2 (two) times daily. 180 capsule 3  . ROPINIROLE HCL 1 MG PO TABS  TAKE 1 TABLET AT BEDTIME 90 tablet 2  . SERTRALINE HCL 100 MG PO TABS Oral Take 1 tablet (100 mg total) by mouth daily. 14 tablet 0  . SIMVASTATIN 10 MG PO TABS Oral Take 0.5 tablets (5 mg total) by mouth daily. 15 tablet 11  . CETIRIZINE HCL 10 MG PO TABS Oral Take 10 mg by mouth daily as needed. For allergy symptoms    . FLUTICASONE PROPIONATE 50 MCG/ACT NA SUSP Nasal 2 sprays by Nasal route daily as needed.        BP 151/85  Pulse 107  Temp(Src) 100 F (37.8 C) (Oral)  Resp 20  SpO2 96%  LMP 03/20/1981  Physical Exam  Nursing note and vitals reviewed. Constitutional: She is oriented to person, place, and time. She appears well-developed and well-nourished. No distress.  Eyes: Conjunctivae are normal.  Neck: Neck supple.  Cardiovascular: Regular rhythm.  Tachycardia present.   No murmur heard. Pulmonary/Chest: Effort normal and breath sounds normal. No respiratory distress. She has no wheezes.  Abdominal: Soft. Bowel sounds are normal.       RLQ tenderness, no guarding, rebound tenderness  Musculoskeletal: Normal range of motion.  Neurological: She is alert and oriented to person, place, and time.  Skin: Skin is warm and dry.  Psychiatric: She has a normal mood and affect.    ED Course  Procedures (including critical care time)  Pt with low grade fever, RLQ pain. Labs ordered. Worrisome for possible appendicitis. Will gt CT abd/pelvis  Results for orders placed during the hospital encounter of 03/25/11  CBC      Component Value Range   WBC 9.6  4.0 - 10.5 (K/uL)   RBC 4.34  3.87 - 5.11 (MIL/uL)     Hemoglobin 11.7 (*) 12.0 - 15.0 (g/dL)   HCT 46.9  62.9 - 52.8 (%)   MCV 83.4  78.0 - 100.0 (fL)   MCH 27.0  26.0 - 34.0 (pg)   MCHC 32.3  30.0 - 36.0 (g/dL)  RDW 14.9  11.5 - 15.5 (%)   Platelets 145 (*) 150 - 400 (K/uL)  COMPREHENSIVE METABOLIC PANEL      Component Value Range   Sodium 141  135 - 145 (mEq/L)   Potassium 4.3  3.5 - 5.1 (mEq/L)   Chloride 103  96 - 112 (mEq/L)   CO2 24  19 - 32 (mEq/L)   Glucose, Bld 90  70 - 99 (mg/dL)   BUN 13  6 - 23 (mg/dL)   Creatinine, Ser 9.52  0.50 - 1.10 (mg/dL)   Calcium 9.5  8.4 - 84.1 (mg/dL)   Total Protein 7.9  6.0 - 8.3 (g/dL)   Albumin 4.1  3.5 - 5.2 (g/dL)   AST 36  0 - 37 (U/L)   ALT 24  0 - 35 (U/L)   Alkaline Phosphatase 71  39 - 117 (U/L)   Total Bilirubin 0.3  0.3 - 1.2 (mg/dL)   GFR calc non Af Amer 88 (*) >90 (mL/min)   GFR calc Af Amer >90  >90 (mL/min)  LIPASE, BLOOD      Component Value Range   Lipase 27  11 - 59 (U/L)  URINALYSIS, ROUTINE W REFLEX MICROSCOPIC      Component Value Range   Color, Urine YELLOW  YELLOW    APPearance CLEAR  CLEAR    Specific Gravity, Urine 1.027  1.005 - 1.030    pH 6.0  5.0 - 8.0    Glucose, UA NEGATIVE  NEGATIVE (mg/dL)   Hgb urine dipstick NEGATIVE  NEGATIVE    Bilirubin Urine NEGATIVE  NEGATIVE    Ketones, ur NEGATIVE  NEGATIVE (mg/dL)   Protein, ur NEGATIVE  NEGATIVE (mg/dL)   Urobilinogen, UA 0.2  0.0 - 1.0 (mg/dL)   Nitrite NEGATIVE  NEGATIVE    Leukocytes, UA SMALL (*) NEGATIVE   URINE MICROSCOPIC-ADD ON      Component Value Range   Squamous Epithelial / LPF RARE  RARE    WBC, UA 3-6  <3 (WBC/hpf)   Ct Abdomen Pelvis W Contrast  03/25/2011  *RADIOLOGY REPORT*  Clinical Data: 66 year old female with abdominal pain.  CT ABDOMEN AND PELVIS WITH CONTRAST  Technique:  Multidetector CT imaging of the abdomen and pelvis was performed following the standard protocol during bolus administration of intravenous contrast.  Contrast: OMNIPAQUE IOHEXOL 300 MG/ML IV SOLN   Comparison: 11/16/2009  Findings: A very large hiatal hernia is noted. Bilateral breast prostheses are present.  The liver, spleen, adrenal glands, gallbladder, pancreas and kidneys are unremarkable. No free fluid, enlarged lymph nodes, biliary dilation or abdominal aortic aneurysm identified.  Focal wall thickening and inflammation of the mid sigmoid colon is compatible with diverticulitis.  There is no evidence of pneumoperitoneum or focal abscess. No other bowel abnormalities are identified.  Hysterectomy noted. No acute or suspicious bony abnormalities are identified.  IMPRESSION: Diverticulitis of the mid sigmoid colon - no evidence of pneumoperitoneum or focal abscess.  Very large hiatal hernia.  Original Report Authenticated By: Rosendo Gros, M.D.    9:01 PM CT abdomen showed diverticulitis. No signs of perforation or an abscess. Will start on cirpo and flagyl. Will d/c home. Pt appears non toxic. VS are within normal at present. Pt wants to go home. Instructed to return if worsening.  MDM          Lottie Mussel, PA 03/25/11 2102

## 2011-03-25 NOTE — ED Notes (Signed)
[facility] patient transferred to ED for further evaluation of lower abdominal pain on the RLQ to rule out appendicitis, colitis or diverticulitis

## 2011-03-25 NOTE — ED Notes (Signed)
Patient undressed and in patient gown

## 2011-03-25 NOTE — ED Provider Notes (Signed)
Medical screening examination/treatment/procedure(s) were conducted as a shared visit with non-physician practitioner(s) and myself.  I personally evaluated the patient during the encounter.  No acute abdomen. We'll treat diverticulitis as outpatient. Patient understands to return if worse  Donnetta Hutching, MD 03/25/11 2308

## 2011-03-25 NOTE — ED Provider Notes (Signed)
History     CSN: [id number]  Arrival date & time 03/25/11  1256   First MD Initiated Contact with Patient 03/25/11 1339      Chief Complaint  Patient presents with  . Abdominal Pain    (Consider location/radiation/quality/duration/timing/severity/associated sxs/prior treatment) Patient is a 66 y.o. female presenting with abdominal pain. The history is provided by the patient.  Abdominal Pain The primary symptoms of the illness include abdominal pain, fever, nausea and diarrhea. The primary symptoms of the illness do not include fatigue, vomiting, dysuria or vaginal bleeding. The current episode started yesterday. The onset of the illness was sudden. The problem has been gradually worsening.  The patient has had a change in bowel habit. Additional symptoms associated with the illness include anorexia. Symptoms associated with the illness do not include constipation, urgency, frequency or back pain. Significant associated medical issues include diverticulitis.    Past Medical History  Diagnosis Date  . Allergy     allegic rhinitis  . Anxiety   . GERD (gastroesophageal reflux disease)   . Hypertension   . Hyperlipidemia   . Seizure disorder   . RLS (restless legs syndrome)   . Arthritis   . Diverticulosis   . GI bleed   . Cataract   . Cancer 02/2009    breast CA L invasive ductal CA(hormone receptor positive.)  . Salmonella 05/2000    renal effects secondary to dehydration  . Hiatal hernia   . Melena 01/2008    anemia transfusion  . Esophageal stricture 01/2008  . Borderline diabetic     per patient medical history form  . Menopause     per patient medical history form  . Postmenopausal hormone therapy     per patient medical history form.    Past Surgical History  Procedure Date  . Abdominal hysterectomy 1983  . Ovary surgery 1986    ovarian tumor removed  . Breast surgery 02/2009    breast biopsy invasive ductal CA  . Mastectomy 04/2009    Bilateral for ductal  carcinoma on L    Family History  Problem Relation Age of Onset  . Heart disease Father     MI  . Cancer Father     esophageal CA  . Diabetes Father   . Cancer Sister     breast CA  . Diabetes Sister   . Hypertension Brother   . Diabetes Brother   . Cancer Sister     breast CA  . Hypertension Mother   . Stroke Mother   . Hypertension Sister     History  Substance Use Topics  . Smoking status: Never Smoker   . Smokeless tobacco: Not on file  . Alcohol Use: No    OB History    Grav Para Term Preterm Abortions TAB SAB Ect Mult Living                  Review of Systems  Constitutional: Positive for fever. Negative for fatigue.  Gastrointestinal: Positive for nausea, abdominal pain, diarrhea and anorexia. Negative for vomiting and constipation.  Genitourinary: Negative for dysuria, urgency, frequency and vaginal bleeding.  Musculoskeletal: Negative for back pain.    Allergies  Penicillins  Home Medications   Current Outpatient Rx  Name Route Sig Dispense Refill  . ALENDRONATE SODIUM 70 MG PO TABS Oral Take 70 mg by mouth every 7 (seven) days. Take with a full glass of water on an empty stomach.     . ALPRAZOLAM 0.5  MG PO TABS Oral Take 0.5 mg by mouth 3 (three) times daily as needed. for anxiety. Careful of sedation.     . ANASTROZOLE 1 MG PO TABS Oral Take 1 tablet by mouth Daily.    Marland Kitchen CETIRIZINE HCL 10 MG PO TABS Oral Take 10 mg by mouth daily as needed.      Marland Kitchen VITAMIN D-3 PO Oral Take 1 tablet by mouth daily.      . ENALAPRIL MALEATE 20 MG PO TABS Oral Take 1 tablet (20 mg total) by mouth daily. 14 tablet 0  . EZETIMIBE 10 MG PO TABS Oral Take 10 mg by mouth daily.      Marland Kitchen FLUTICASONE PROPIONATE 50 MCG/ACT NA SUSP Nasal 2 sprays by Nasal route daily as needed.      Marland Kitchen GLUCOSE BLOOD VI STRP  Check glucose two times a day and as needd for DM 2 250.0     . HYDROCHLOROTHIAZIDE 25 MG PO TABS Oral Take 25 mg by mouth daily.      Marland Kitchen LANCETS MISC  Check glucose two times  a day and as needed for DM 2 250.0     . METFORMIN HCL 500 MG PO TABS Oral Take 1 tablet (500 mg total) by mouth 2 (two) times daily with a meal. 180 tablet 3  . MULTIVITAMINS PO CAPS Oral Take 1 capsule by mouth daily.      Marland Kitchen NAPROXEN SODIUM 220 MG PO CAPS Oral Take by mouth as needed.      . OMEGA-3-6-9 PO Oral Take 1 capsule by mouth daily.      Marland Kitchen OMEPRAZOLE 20 MG PO CPDR Oral Take 1 capsule (20 mg total) by mouth 2 (two) times daily. 180 capsule 3  . ROPINIROLE HCL 1 MG PO TABS  TAKE 1 TABLET AT BEDTIME 90 tablet 2  . SERTRALINE HCL 100 MG PO TABS Oral Take 1 tablet (100 mg total) by mouth daily. 14 tablet 0  . SIMVASTATIN 10 MG PO TABS Oral Take 0.5 tablets (5 mg total) by mouth daily. 15 tablet 11    BP 139/92  Pulse 114  Temp(Src) 100 F (37.8 C) (Oral)  Resp 16  SpO2 97%  LMP 03/20/1981  Physical Exam  Nursing note and vitals reviewed. Constitutional: She appears well-developed and well-nourished. No distress.  HENT:  Head: Normocephalic.  Eyes: Conjunctivae are normal. No scleral icterus.  Neck: Normal range of motion. Neck supple. No JVD present.  Pulmonary/Chest: Effort normal and breath sounds normal. No respiratory distress.  Abdominal: Soft. Bowel sounds are normal. She exhibits no mass. There is tenderness in the right lower quadrant and suprapubic area. There is rebound. There is no guarding. No hernia.  Lymphadenopathy:    She has no cervical adenopathy.  Skin: No erythema.    ED Course  Procedures (including critical care time)  Labs Reviewed - No data to display No results found.   1. Abdominal pain       MDM  SIgnificant abnormal abdominal exam-considering acute abdomen-transferred to the ED        Jimmie Molly, MD 03/25/11 1450

## 2011-03-25 NOTE — ED Notes (Signed)
Reports low abdominal pain, particularly on the right, however with sitting down, feels shooting pain in center low abdomen, including floor of perineum.  C/o nausea, no vomiting.  Last bm was last night: reports 6 diarrhea stools yesterday.  Reports frequest diarrhea stools that occurred yesterday are not uncommon for her since starting metformin.  Denies urinary symptoms.  Low abdominal pain onset yesterday.

## 2011-03-30 ENCOUNTER — Other Ambulatory Visit: Payer: Self-pay | Admitting: Family Medicine

## 2011-03-30 NOTE — Telephone Encounter (Signed)
[facility] request refill Zetia 10 mg # 90 x 3.

## 2011-04-12 ENCOUNTER — Other Ambulatory Visit: Payer: Self-pay

## 2011-04-12 ENCOUNTER — Encounter (INDEPENDENT_AMBULATORY_CARE_PROVIDER_SITE_OTHER): Payer: Self-pay | Admitting: Surgery

## 2011-04-12 MED ORDER — SIMVASTATIN 10 MG PO TABS: 5.0000 mg | ORAL_TABLET | Freq: Every day | ORAL | Status: DC

## 2011-04-12 NOTE — Telephone Encounter (Signed)
[facility] faxed refill request Simvastatin 10 mg #45 X 3.

## 2011-04-14 ENCOUNTER — Other Ambulatory Visit: Payer: Self-pay | Admitting: Oncology

## 2011-06-08 ENCOUNTER — Encounter (INDEPENDENT_AMBULATORY_CARE_PROVIDER_SITE_OTHER): Payer: Self-pay | Admitting: Surgery

## 2011-06-08 ENCOUNTER — Ambulatory Visit (INDEPENDENT_AMBULATORY_CARE_PROVIDER_SITE_OTHER): Payer: 59 | Admitting: Surgery

## 2011-06-08 VITALS — BP 164/98 | HR 98 | Temp 97.4°F | Resp 20 | Ht 64.5 in | Wt 161.4 lb

## 2011-06-08 DIAGNOSIS — Z853 Personal history of malignant neoplasm of breast: Secondary | ICD-10-CM

## 2011-06-08 NOTE — Patient Instructions (Signed)
Return 1 year. 

## 2011-06-08 NOTE — Progress Notes (Signed)
NAME: Valerie Ball       DOB: [birth year]           DATE: 06/08/2011       MRN: [id number]   Valerie Ball is a 66 y.o.Marland Kitchenfemale who presents for routine followup of her Left breast cancer stage 1diagnosed in 04/2009 and treated with bilateral mastectomy and reconstruction.. She has no problems or concerns on either side.  PFSH: She has had no significant changes since the last visit here.  ROS: There have been no significant changes since the last visit here  EXAM: General: The patient is alert, oriented, generally healty appearing, NAD. Mood and affect are normal.  Breasts:  implants in place.  No masses bilaterally   Lymphatics: She has no axillary or supraclavicular adenopathy on either side.  Extremities: Full ROM of the surgical side with no lymphedema noted.  Data Reviewed: Dr Welton Flakes note  Impression: Doing well, with no evidence of recurrent cancer or new cancer  Plan: Will continue to follow up on an annual basis here.

## 2011-07-06 ENCOUNTER — Other Ambulatory Visit: Payer: Self-pay | Admitting: Oncology

## 2011-07-11 ENCOUNTER — Telehealth: Payer: Self-pay | Admitting: Family Medicine

## 2011-07-11 DIAGNOSIS — Z0000 Encounter for general adult medical examination without abnormal findings: Secondary | ICD-10-CM

## 2011-07-11 DIAGNOSIS — E785 Hyperlipidemia, unspecified: Secondary | ICD-10-CM

## 2011-07-11 DIAGNOSIS — E119 Type 2 diabetes mellitus without complications: Secondary | ICD-10-CM

## 2011-07-11 HISTORY — DX: Encounter for general adult medical examination without abnormal findings: Z00.00

## 2011-07-11 NOTE — Telephone Encounter (Signed)
Message copied by [name] on Tue Jul 11, 2011  8:15 PM ------      Message from: Baldomero Lamy      Created: Mon Jul 03, 2011 10:50 AM      Regarding: Cpx labs Wed 4/24       Please order  future cpx labs for pt's upcomming lab appt.      Thanks      Rodney Booze

## 2011-07-13 ENCOUNTER — Other Ambulatory Visit (INDEPENDENT_AMBULATORY_CARE_PROVIDER_SITE_OTHER): Payer: 59

## 2011-07-13 DIAGNOSIS — E119 Type 2 diabetes mellitus without complications: Secondary | ICD-10-CM

## 2011-07-13 DIAGNOSIS — Z0000 Encounter for general adult medical examination without abnormal findings: Secondary | ICD-10-CM

## 2011-07-13 DIAGNOSIS — E785 Hyperlipidemia, unspecified: Secondary | ICD-10-CM

## 2011-07-13 LAB — CBC WITH DIFFERENTIAL/PLATELET
Eosinophils Relative: 0.4 % (ref 0.0–5.0)
Lymphocytes Relative: 22.7 % (ref 12.0–46.0)
MCV: 81.6 fl (ref 78.0–100.0)
Monocytes Absolute: 0.5 10*3/uL (ref 0.1–1.0)
Neutrophils Relative %: 70.7 % (ref 43.0–77.0)
Platelets: 164 10*3/uL (ref 150.0–400.0)
WBC: 8.3 10*3/uL (ref 4.5–10.5)

## 2011-07-13 LAB — COMPREHENSIVE METABOLIC PANEL
Albumin: 4 g/dL (ref 3.5–5.2)
Alkaline Phosphatase: 72 U/L (ref 39–117)
BUN: 26 mg/dL — ABNORMAL HIGH (ref 6–23)
CO2: 27 mEq/L (ref 19–32)
Calcium: 9.3 mg/dL (ref 8.4–10.5)
Chloride: 105 mEq/L (ref 96–112)
GFR: 51.02 mL/min — ABNORMAL LOW (ref >60.00)
Glucose, Bld: 115 mg/dL — ABNORMAL HIGH (ref 70–99)
Potassium: 4.6 mEq/L (ref 3.5–5.1)
Sodium: 142 mEq/L (ref 135–145)
Total Protein: 7.5 g/dL (ref 6.0–8.3)

## 2011-07-13 LAB — HEMOGLOBIN A1C: Hgb A1c MFr Bld: 6.3 % (ref 4.6–6.5)

## 2011-07-13 LAB — LIPID PANEL
Cholesterol: 168 mg/dL (ref 0–200)
Triglycerides: 249 mg/dL — ABNORMAL HIGH (ref 0.0–149.0)
VLDL: 49.8 mg/dL — ABNORMAL HIGH (ref 0.0–40.0)

## 2011-07-19 ENCOUNTER — Encounter: Payer: Self-pay | Admitting: Family Medicine

## 2011-07-19 ENCOUNTER — Ambulatory Visit (INDEPENDENT_AMBULATORY_CARE_PROVIDER_SITE_OTHER): Payer: 59 | Admitting: Family Medicine

## 2011-07-19 VITALS — BP 138/88 | HR 91 | Temp 98.0°F | Ht 64.5 in | Wt 161.8 lb

## 2011-07-19 DIAGNOSIS — Z0000 Encounter for general adult medical examination without abnormal findings: Secondary | ICD-10-CM

## 2011-07-19 DIAGNOSIS — E785 Hyperlipidemia, unspecified: Secondary | ICD-10-CM

## 2011-07-19 DIAGNOSIS — Z23 Encounter for immunization: Secondary | ICD-10-CM

## 2011-07-19 DIAGNOSIS — T887XXA Unspecified adverse effect of drug or medicament, initial encounter: Secondary | ICD-10-CM

## 2011-07-19 DIAGNOSIS — I10 Essential (primary) hypertension: Secondary | ICD-10-CM

## 2011-07-19 DIAGNOSIS — E119 Type 2 diabetes mellitus without complications: Secondary | ICD-10-CM

## 2011-07-19 NOTE — Assessment & Plan Note (Signed)
Having a lot of pain from arimidex that is keeping her awake and interfering with her quality of life She will discuss this with her oncologist

## 2011-07-19 NOTE — Patient Instructions (Addendum)
Tdap vaccine today  If you are interested in a shingles/zoster vaccine - call your insurance to check on coverage,( you should not get it within 1 month of other vaccines) , then call us for a prescription  for it to take to a pharmacy that gives the shot   don't forget to get your yearly eye exam for diabetes Remind your oncologist to order your 2 year dexa (bone density) Try to get 1200-1500 mg of calcium per day with at least 1000 iu of vitamin D - for bone health  Hold metformin for 3 months - to see if it is causing diarrhea  Schedule lab and follow up in 3 months

## 2011-07-19 NOTE — Progress Notes (Signed)
Subjective:    Patient ID: Valerie Ball, female    DOB: 1945/09/28, 66 y.o.   MRN: [id number]  HPI Here for health maintenance exam and to review chronic medical problems    Had hyst in past  No abn paps or cervical ca  No gyn symptoms   mammo -- both sides- no more mammo  Oncologist - 6 months ago  Had breast cancer with mastectomy arimidex makes her sweat and ache all over   Tdap - is due for that   Zoster status-  ? About coverage   colonosc 04 with adhesions Could not get through -will not re attempt   opthy- approx 2/12- is due for one  Is due for that  Did have cataract surgery - seeing them a lot   OP- dexa 5/11 Due for 2 year check - onc will order that  Mild osteopenia Ca and D- takes the D -needs the ca   bp is   144/90  Today No cp or palpitations or headaches or edema  No side effects to medicines     DM- a1c is 6.3 down from 6.6  Is doing well with diet overall  Is getting some exercise  Will start walking - has a new puppy   Trig high Lab Results  Component Value Date   CHOL 168 07/13/2011   CHOL 166 02/28/2011   CHOL 209* 01/13/2011   Lab Results  Component Value Date   HDL 37.70* 07/13/2011   HDL 36.80* 02/28/2011   HDL 36.60* 01/13/2011   No results found for this basename: LDLCALC   Lab Results  Component Value Date   TRIG 249.0* 07/13/2011   TRIG 368.0* 02/28/2011   TRIG 372.0* 01/13/2011   Lab Results  Component Value Date   CHOLHDL 4 07/13/2011   CHOLHDL 5 02/28/2011   CHOLHDL 6 01/13/2011   Lab Results  Component Value Date   LDLDIRECT 98.2 07/13/2011   LDLDIRECT 85.5 02/28/2011   LDLDIRECT 132.4 01/13/2011   on zetia - and diet together   Pain in legs/ feet/ knees and hips - is getting worse  She tends to have diarrhea very often -- then occ constipation  It does depend what she eats -- and once she vomited with a taco salad  Does not take fiber supplement   occ gets shaky and breaks out in a sweat -? Low blood  sugar   Patient Active Problem List  Diagnoses  . BREAST CANCER  . DIABETES MELLITUS, TYPE II  . HYPERLIPIDEMIA  . UNSPECIFIED ANEMIA  . ANXIETY  . DISORDERS, ORGANIC SLEEP NEC  . SYNDROME, RESTLESS LEGS  . SYNDROME, CARPAL TUNNEL  . HYPERTENSION  . ALLERGIC RHINITIS  . ESOPHAGEAL STRICTURE  . GERD  . HIATAL HERNIA  . DIVERTICULOSIS-COLON  . PERITONEAL ADHESIONS  . FATTY LIVER DISEASE  . SEIZURE DISORDER  . TRANSAMINASES, SERUM, ELEVATED  . ADVERSE DRUG REACTION  . Routine general medical examination at a health care facility   Past Medical History  Diagnosis Date  . Allergy     allegic rhinitis  . Anxiety   . GERD (gastroesophageal reflux disease)   . Hypertension   . Hyperlipidemia   . Seizure disorder   . RLS (restless legs syndrome)   . Arthritis   . Diverticulosis   . GI bleed   . Cataract   . Cancer 02/2009    breast CA L invasive ductal CA(hormone receptor positive.)  . Salmonella 05/2000  renal effects secondary to dehydration  . Hiatal hernia   . Melena 01/2008    anemia transfusion  . Esophageal stricture 01/2008  . Borderline diabetic     per patient medical history form  . Menopause     per patient medical history form  . Postmenopausal hormone therapy     per patient medical history form.  . Blood transfusion   . Diabetes mellitus   . Osteoporosis    Past Surgical History  Procedure Date  . Abdominal hysterectomy 1983  . Ovary surgery 1986    ovarian tumor removed  . Breast surgery 02/2009    breast biopsy invasive ductal CA  . Mastectomy 04/2009    Bilateral for ductal carcinoma on L   History  Substance Use Topics  . Smoking status: Never Smoker   . Smokeless tobacco: Not on file  . Alcohol Use: No   Family History  Problem Relation Age of Onset  . Heart disease Father     MI  . Cancer Father     esophageal CA  . Diabetes Father   . Cancer Sister     breast CA  . Diabetes Sister   . Hypertension Brother   . Diabetes  Brother   . Cancer Sister     breast CA  . Hypertension Mother   . Stroke Mother   . Hypertension Sister    Allergies  Allergen Reactions  . Penicillins Rash   Current Outpatient Prescriptions on File Prior to Visit  Medication Sig Dispense Refill  . alendronate (FOSAMAX) 70 MG tablet Take 70 mg by mouth every 7 (seven) days. Take with a full glass of water on an empty stomach. Take on Sat.      Marland Kitchen anastrozole (ARIMIDEX) 1 MG tablet TAKE 1 TABLET DAILY  90 tablet  12  . Cholecalciferol (VITAMIN D-3 PO) Take 1 tablet by mouth daily.        . enalapril (VASOTEC) 20 MG tablet Take 1 tablet (20 mg total) by mouth daily.  14 tablet  0  . fluticasone (FLONASE) 50 MCG/ACT nasal spray 2 sprays by Nasal route daily as needed.        . metFORMIN (GLUCOPHAGE) 500 MG tablet Take 500 mg by mouth 2 (two) times daily with a meal.        . omeprazole (PRILOSEC) 20 MG capsule Take 20 mg by mouth 2 (two) times daily.      Marland Kitchen rOPINIRole (REQUIP) 1 MG tablet TAKE 1 TABLET AT BEDTIME  90 tablet  2  . sertraline (ZOLOFT) 100 MG tablet Take 1 tablet (100 mg total) by mouth daily.  14 tablet  0  . simvastatin (ZOCOR) 10 MG tablet Take 0.5 tablets (5 mg total) by mouth daily.  45 tablet  3  . ZETIA 10 MG tablet TAKE 1 TABLET DAILY  90 tablet  3  . DISCONTD: cetirizine (ZYRTEC) 10 MG tablet Take 10 mg by mouth daily as needed. For allergy symptoms      . DISCONTD: hydrochlorothiazide 25 MG tablet Take 25 mg by mouth daily.            Review of Systems    Review of Systems  Constitutional: Negative for fever, appetite change, and unexpected weight change.  Eyes: Negative for pain and visual disturbance.  Respiratory: Negative for cough and shortness of breath.   Cardiovascular: Negative for cp or palpitations    Gastrointestinal: Negative for nausea, diarrhea and constipation.  Genitourinary: Negative for  urgency and frequency.  Endo - pos for sweats/ fatigue /body aches - from her medication Skin:  Negative for pallor or rash   Neurological: Negative for weakness, light-headedness, numbness and headaches.  Hematological: Negative for adenopathy. Does not bruise/bleed easily.  Psychiatric/Behavioral: Negative for dysphoric mood. The patient is not nervous/anxious.      Objective:   Physical Exam  Constitutional: She appears well-developed and well-nourished. No distress.  HENT:  Head: Normocephalic and atraumatic.  Right Ear: External ear normal.  Left Ear: External ear normal.  Nose: Nose normal.  Mouth/Throat: Oropharynx is clear and moist.  Eyes: Conjunctivae and EOM are normal. Pupils are equal, round, and reactive to light. No scleral icterus.  Neck: Normal range of motion. Neck supple. No JVD present. No thyromegaly present.  Cardiovascular: Normal rate, regular rhythm, normal heart sounds and intact distal pulses.  Exam reveals no gallop.   Pulmonary/Chest: Effort normal and breath sounds normal. No respiratory distress. She has no wheezes. She exhibits no tenderness.  Abdominal: Soft. Bowel sounds are normal. She exhibits no distension and no mass. There is no tenderness.  Genitourinary:       S/p double mastectomy  Musculoskeletal: Normal range of motion. She exhibits no edema and no tenderness.  Lymphadenopathy:    She has no cervical adenopathy.  Neurological: She is alert. She has normal reflexes. No cranial nerve deficit. She exhibits normal muscle tone. Coordination normal.  Skin: Skin is warm and dry. No rash noted. No erythema. No pallor.  Psychiatric: She has a normal mood and affect.          Assessment & Plan:

## 2011-07-19 NOTE — Assessment & Plan Note (Signed)
bp in fair control at this time  No changes needed  Disc lifstyle change with low sodium diet and exercise   Better on 2nd check Some stress issues Rev labs

## 2011-07-19 NOTE — Assessment & Plan Note (Signed)
Reviewed health habits including diet and exercise and skin cancer prevention Also reviewed health mt list, fam hx and immunizations  Rev labs and recent studies in detail No more mammo due to mastectomy

## 2011-07-19 NOTE — Assessment & Plan Note (Signed)
zetia helps  Trig up due to DM Disc goals for lipids and reasons to control them Rev labs with pt Rev low sat fat diet in detail

## 2011-07-19 NOTE — Assessment & Plan Note (Signed)
Very good control- however exp frequent diarrhea that may be related to her metformin Will hold that 3 mo - re check a1c and f/u for disc Cannot have colonosc due to adhesions

## 2011-07-20 ENCOUNTER — Other Ambulatory Visit: Payer: 59 | Admitting: Lab

## 2011-07-20 ENCOUNTER — Ambulatory Visit: Payer: 59 | Admitting: Oncology

## 2011-07-26 ENCOUNTER — Other Ambulatory Visit: Payer: Self-pay | Admitting: Family Medicine

## 2011-07-26 NOTE — Telephone Encounter (Signed)
Will refill electronically  

## 2011-08-17 ENCOUNTER — Telehealth: Payer: Self-pay

## 2011-08-17 MED ORDER — ALPRAZOLAM 0.5 MG PO TABS: 0.5000 mg | ORAL_TABLET | Freq: Two times a day (BID) | ORAL | Status: AC | PRN

## 2011-08-17 NOTE — Telephone Encounter (Signed)
Pt flying to [facility] 08/20/11 request med for anxiety of flying. [facility].

## 2011-08-17 NOTE — Telephone Encounter (Signed)
Px written for call in  For xanax- use caution for sedation

## 2011-08-18 NOTE — Telephone Encounter (Signed)
Patients spouse advised that Rx was called to [facility].

## 2011-08-30 ENCOUNTER — Other Ambulatory Visit: Payer: Self-pay | Admitting: Family Medicine

## 2011-08-30 NOTE — Telephone Encounter (Signed)
Received refill request electronically from pharmacy. Last office visit 07/19/11. Is it okay to refill medication?

## 2011-08-30 NOTE — Telephone Encounter (Signed)
Will refill electronically  

## 2011-09-04 ENCOUNTER — Telehealth: Payer: Self-pay | Admitting: Family Medicine

## 2011-09-04 ENCOUNTER — Ambulatory Visit (INDEPENDENT_AMBULATORY_CARE_PROVIDER_SITE_OTHER): Payer: 59 | Admitting: Family Medicine

## 2011-09-04 ENCOUNTER — Encounter: Payer: Self-pay | Admitting: Family Medicine

## 2011-09-04 ENCOUNTER — Ambulatory Visit (INDEPENDENT_AMBULATORY_CARE_PROVIDER_SITE_OTHER)
Admission: RE | Admit: 2011-09-04 | Discharge: 2011-09-04 | Disposition: A | Discharge status: Home / Self Care | Payer: 59 | Source: Ambulatory Visit | Attending: Family Medicine | Admitting: Family Medicine

## 2011-09-04 VITALS — BP 118/68 | HR 93 | Temp 98.0°F | Ht 63.5 in | Wt 159.2 lb

## 2011-09-04 DIAGNOSIS — D649 Anemia, unspecified: Secondary | ICD-10-CM

## 2011-09-04 DIAGNOSIS — R1013 Epigastric pain: Secondary | ICD-10-CM | POA: Insufficient documentation

## 2011-09-04 DIAGNOSIS — R52 Pain, unspecified: Secondary | ICD-10-CM

## 2011-09-04 LAB — CBC WITH DIFFERENTIAL/PLATELET
Basophils Relative: 0.3 % (ref 0.0–3.0)
Eosinophils Relative: 1.1 % (ref 0.0–5.0)
HCT: 32.4 % — ABNORMAL LOW (ref 36.0–46.0)
Lymphs Abs: 2.4 10*3/uL (ref 0.7–4.0)
MCHC: 32.5 g/dL (ref 30.0–36.0)
MCV: 82.8 fl (ref 78.0–100.0)
Monocytes Absolute: 0.6 10*3/uL (ref 0.1–1.0)
RBC: 3.91 Mil/uL (ref 3.87–5.11)
WBC: 7.7 10*3/uL (ref 4.5–10.5)

## 2011-09-04 LAB — HEPATIC FUNCTION PANEL: Albumin: 4.1 g/dL (ref 3.5–5.2)

## 2011-09-04 LAB — BASIC METABOLIC PANEL
BUN: 27 mg/dL — ABNORMAL HIGH (ref 6–23)
Calcium: 9 mg/dL (ref 8.4–10.5)
Creatinine, Ser: 0.7 mg/dL (ref 0.4–1.2)
GFR: 94.17 mL/min (ref >60.00)
Glucose, Bld: 89 mg/dL (ref 70–99)

## 2011-09-04 LAB — AMYLASE: Amylase: 51 U/L (ref 27–131)

## 2011-09-04 NOTE — Patient Instructions (Addendum)
Abdominal xray today and labs  Continue prilosec twice daily  Will update you with results later today If your symptoms suddenly worsen- let me know/ go to ER if needed  Hold your fosamax for now

## 2011-09-04 NOTE — Telephone Encounter (Signed)
Will see her then 

## 2011-09-04 NOTE — Telephone Encounter (Signed)
Caller: Valerie Ball/Patient; PCP: Valerie Manns A.; CB#: [phone]; ; ; Call regarding Abdominal, Back Pain; Black Stool;  Pt is having abdominal pain with black stool. Pt was transferred from the office after scheduling an appt today at 10:40

## 2011-09-04 NOTE — Progress Notes (Signed)
Subjective:    Patient ID: Valerie Ball, female    DOB: 02-02-1946, 66 y.o.   MRN: [id number]  HPI 66 year old with complex med hx here for abd pain - that began as back pain 1 mo ago   Has hx of HH and GERD and stricture in past on prilosec Also upper GI bleed with anemia and transfusion in 09  Lab Results  Component Value Date   WBC 8.3 07/13/2011   HGB 11.4* 07/13/2011   HCT 34.3* 07/13/2011   MCV 81.6 07/13/2011   PLT 164.0 07/13/2011     Also c/o bloating / fatigue/ nausea for 2 d and LUQ tenderness for 4 days Both sides hurt and also in lower abd pain -- is all over  No swallowing issues   Does feel like she felt with GI bleed Dark stool since yesterday No pepto   Stress really not that bad right now  Does not know why stomach is acting up  No change in med or diet  Is on fosamax No nsaids  Patient Active Problem List  Diagnosis  . BREAST CANCER  . DIABETES MELLITUS, TYPE II  . HYPERLIPIDEMIA  . UNSPECIFIED ANEMIA  . ANXIETY  . DISORDERS, ORGANIC SLEEP NEC  . SYNDROME, RESTLESS LEGS  . SYNDROME, CARPAL TUNNEL  . HYPERTENSION  . ALLERGIC RHINITIS  . ESOPHAGEAL STRICTURE  . GERD  . HIATAL HERNIA  . DIVERTICULOSIS-COLON  . PERITONEAL ADHESIONS  . FATTY LIVER DISEASE  . SEIZURE DISORDER  . TRANSAMINASES, SERUM, ELEVATED  . ADVERSE DRUG REACTION  . Routine general medical examination at a health care facility  . Abdominal pain, acute, epigastric   Past Medical History  Diagnosis Date  . Allergy     allegic rhinitis  . Anxiety   . GERD (gastroesophageal reflux disease)   . Hypertension   . Hyperlipidemia   . Seizure disorder   . RLS (restless legs syndrome)   . Arthritis   . Diverticulosis   . GI bleed   . Cataract   . Cancer 02/2009    breast CA L invasive ductal CA(hormone receptor positive.)  . Salmonella 05/2000    renal effects secondary to dehydration  . Hiatal hernia   . Melena 01/2008    anemia transfusion  . Esophageal stricture  01/2008  . Borderline diabetic     per patient medical history form  . Menopause     per patient medical history form  . Postmenopausal hormone therapy     per patient medical history form.  . Blood transfusion   . Diabetes mellitus   . Osteoporosis    Past Surgical History  Procedure Date  . Abdominal hysterectomy 1983  . Ovary surgery 1986    ovarian tumor removed  . Breast surgery 02/2009    breast biopsy invasive ductal CA  . Mastectomy 04/2009    Bilateral for ductal carcinoma on L   History  Substance Use Topics  . Smoking status: Never Smoker   . Smokeless tobacco: Not on file  . Alcohol Use: No   Family History  Problem Relation Age of Onset  . Heart disease Father     MI  . Cancer Father     esophageal CA  . Diabetes Father   . Cancer Sister     breast CA  . Diabetes Sister   . Hypertension Brother   . Diabetes Brother   . Cancer Sister     breast CA  . Hypertension  Mother   . Stroke Mother   . Hypertension Sister    Allergies  Allergen Reactions  . Penicillins Rash   Current Outpatient Prescriptions on File Prior to Visit  Medication Sig Dispense Refill  . alendronate (FOSAMAX) 70 MG tablet Take 70 mg by mouth every 7 (seven) days. Take with a full glass of water on an empty stomach. Take on Sat.      . ALPRAZolam (XANAX) 0.5 MG tablet Take 1 tablet (0.5 mg total) by mouth 2 (two) times daily as needed for anxiety. For air travel anxiety  10 tablet  0  . anastrozole (ARIMIDEX) 1 MG tablet TAKE 1 TABLET DAILY  90 tablet  12  . Cholecalciferol (VITAMIN D-3 PO) Take 1 tablet by mouth daily.        . enalapril (VASOTEC) 20 MG tablet TAKE 1 TABLET DAILY  90 tablet  3  . fluticasone (FLONASE) 50 MCG/ACT nasal spray 2 sprays by Nasal route daily as needed.        Marland Kitchen omeprazole (PRILOSEC) 20 MG capsule Take 20 mg by mouth daily.       Marland Kitchen rOPINIRole (REQUIP) 1 MG tablet TAKE 1 TABLET AT BEDTIME  90 tablet  3  . sertraline (ZOLOFT) 100 MG tablet TAKE 1 TABLET  DAILY  90 tablet  3  . simvastatin (ZOCOR) 10 MG tablet Take 0.5 tablets (5 mg total) by mouth daily.  45 tablet  3  . ZETIA 10 MG tablet TAKE 1 TABLET DAILY  90 tablet  3  . metFORMIN (GLUCOPHAGE) 500 MG tablet Take 500 mg by mouth 2 (two) times daily with a meal.        . DISCONTD: cetirizine (ZYRTEC) 10 MG tablet Take 10 mg by mouth daily as needed. For allergy symptoms      . DISCONTD: hydrochlorothiazide 25 MG tablet Take 25 mg by mouth daily.              Review of Systems Review of Systems  Constitutional: Negative for fever, appetite change,  and unexpected weight change. pos for fatigue  Eyes: Negative for pain and visual disturbance.  Respiratory: Negative for cough and shortness of breath.   Cardiovascular: Negative for cp or palpitations    Gastrointestinal: Negative for diarrhea/ constipation/ BRB in stool, pos for epigastric pain  Genitourinary: Negative for urgency and frequency.  Skin: Negative for rash/ pos (per pt) for mild pallor Neurological: Negative for weakness, light-headedness, numbness and headaches.  Hematological: Negative for adenopathy. Does not bruise/bleed easily.  Psychiatric/Behavioral: Negative for dysphoric mood. The patient is not nervous/anxious.         Objective:   Physical Exam  Constitutional: She appears well-developed and well-nourished. No distress.  HENT:  Head: Normocephalic and atraumatic.  Mouth/Throat: Oropharynx is clear and moist.  Eyes: Conjunctivae are normal. Pupils are equal, round, and reactive to light. No scleral icterus.       No conj pallor  Neck: Normal range of motion. Neck supple. No thyromegaly present.  Cardiovascular: Normal rate, regular rhythm and normal heart sounds.   Pulmonary/Chest: Effort normal and breath sounds normal. No respiratory distress. She has no wheezes.  Abdominal: Soft. Bowel sounds are normal. She exhibits no distension, no abdominal bruit, no ascites and no mass. There is no  hepatosplenomegaly. There is tenderness in the epigastric area. There is no rebound, no guarding, no CVA tenderness and negative Murphy's sign.  Genitourinary: Rectal exam shows no external hemorrhoid, no fissure and no tenderness. Guaiac  positive stool.       Scant heme pos stool  Musculoskeletal: She exhibits no edema.  Lymphadenopathy:    She has no cervical adenopathy.  Neurological: She is alert. She has normal reflexes.  Skin: Skin is warm and dry. No rash noted. No erythema. No pallor.  Psychiatric: She has a normal mood and affect.          Assessment & Plan:

## 2011-09-04 NOTE — Assessment & Plan Note (Signed)
Past hx of GI bleed Cbc today  See assesment for epig pai

## 2011-09-04 NOTE — Telephone Encounter (Signed)
Hemoglobin is down about a point - which worries me for GI bleeding of some sort  Take prilosec twice daily  I will do ref to GI now  Update me if symptoms worsen

## 2011-09-04 NOTE — Assessment & Plan Note (Signed)
With hx of GI bleed in past with transfusion Epigastric pain with scant heme pos stool (dark in color) - susp for poss PUD Will inc prilosec to bid Is stable currently Lab today  abd xray nl - no free air  Will ref to GI for further eval- but inst to call if worse

## 2011-09-05 NOTE — Telephone Encounter (Signed)
Patient advised.  She states she already has the GI appt.

## 2011-09-08 ENCOUNTER — Ambulatory Visit (INDEPENDENT_AMBULATORY_CARE_PROVIDER_SITE_OTHER): Payer: 59 | Admitting: Gastroenterology

## 2011-09-08 ENCOUNTER — Encounter: Payer: Self-pay | Admitting: Gastroenterology

## 2011-09-08 VITALS — BP 128/80 | HR 90 | Ht 63.5 in | Wt 160.4 lb

## 2011-09-08 DIAGNOSIS — K922 Gastrointestinal hemorrhage, unspecified: Secondary | ICD-10-CM

## 2011-09-08 DIAGNOSIS — K449 Diaphragmatic hernia without obstruction or gangrene: Secondary | ICD-10-CM

## 2011-09-08 DIAGNOSIS — K5730 Diverticulosis of large intestine without perforation or abscess without bleeding: Secondary | ICD-10-CM

## 2011-09-08 DIAGNOSIS — K5790 Diverticulosis of intestine, part unspecified, without perforation or abscess without bleeding: Secondary | ICD-10-CM

## 2011-09-08 DIAGNOSIS — D649 Anemia, unspecified: Secondary | ICD-10-CM

## 2011-09-08 DIAGNOSIS — Z8719 Personal history of other diseases of the digestive system: Secondary | ICD-10-CM

## 2011-09-08 MED ORDER — DEXLANSOPRAZOLE 60 MG PO CPDR: 60.0000 mg | DELAYED_RELEASE_CAPSULE | Freq: Every day | ORAL | Status: DC

## 2011-09-08 NOTE — Patient Instructions (Addendum)
You have been scheduled for an endoscopy with propofol. Please follow written instructions given to you at your visit today. You will be getting a phone call from [facility] regarding scheduling your CT virtual Colonoscopy. They precert your insurance company first then contact you to schedule.  Stop taking your Prilosec and start Dexilant samples one tablet by mouth once daily. A prescription has been sent to your pharmacy.  Make sure your are not taking any NSAID's.  cc: [name], MD

## 2011-09-08 NOTE — Progress Notes (Signed)
History of Present Illness:  This is a very nice 66 year old Caucasian female status post bilateral mastectomies for breast cancer 2 years ago followed by oncology on Arimidex 1 mg tablet daily. She is in remission from her breast cancer, and review of her labs shows a low-grade chronic anemia. Several days ago she had a black melanotic stool which resolved spontaneously. She does take when necessary Naprosyn for arthritis type joint pains. Because of chronic acid reflux and a large hiatal hernia, she is on Prilosec 20 mg a day chronically. She denies acid reflux symptoms or dysphagia or any hepatobiliary complaints. The patient has had previous evaluation in 2004 for colon cancer screening, and had a severely tortuous, redundant, and fixed sigmoid colon negating full colonoscopy exam. Barium enema exam was performed and was otherwise unremarkable except for marked diverticulosis and marked tortuosity. The patient denies lower gastrointestinal issues at this time with regular bowel movements. Brief attempts of the Glucophage therapy for glucose intolerance resulted in severe diarrhea. She is not on any oral diabetic medications at this time. She apparently has osteoporosis and is on monthly Fosamax, vitamin D, and for depression takes Zoloft and Xanax. The patient denies any palpitations, shortness of breath, or symptoms of hypovolemia. Family history is noncontributory. She did have an episode of diverticulitis in January confirmed by CT scan which was reviewed and otherwise was unremarkable but did show a large hiatal hernia.Marland Kitchen She was treated with antibiotic therapy at that time. The patient denies a history of recurrent diverticulitis.  I have reviewed this patient's present history, medical and surgical past history, allergies and medications.     ROS: The remainder of the 10 point ROS is negative... the patient denies food intolerances, anorexia or weight loss. There is no history of significant  cardiovascular, pulmonary or genitourinary problems. She is on daily Vasotec for hypertension and Requip for restless leg syndrome.     Physical Exam: Blood pressure 128/80, pulse 90 and regular, weight 160 pounds a BMI of 27.97. General well developed well nourished patient in no acute distress, appearing their stated age Eyes PERRLA, no icterus, fundoscopic exam per opthamologist Skin no lesions noted Neck supple, no adenopathy, no thyroid enlargement, no tenderness Chest clear to percussion and auscultation Heart no significant murmurs, gallops or rubs noted Abdomen no hepatosplenomegaly masses or tenderness, BS normal.  Rectal inspection normal no fissures, or fistulae noted.  No masses or tenderness on digital exam. Stool guaiac negative. Extremities no acute joint lesions, edema, phlebitis or evidence of cellulitis. Neurologic patient oriented x 3, cranial nerves intact, no focal neurologic deficits noted. Psychological mental status normal and normal affect.  Assessment and plan: Probable minor GI bleed from Naprosyn therapy in the face of a patient who has chronic GERD and a large hiatal hernia without current reflux symptoms or dysphagia. I have asked her stop all NSAIDs, and have changed her from Prilosec to Dexilant 60 mg a day with outpatient endoscopy ASAP. Because of her need for colon cancer screening, we will try to set up CT colonoscopy because of her previous failed attempts at colonoscopy. Review of her chart shows that she had an episode of diverticulitis in January of 2013 treated local emergency care Center. The patient does not give a history of recurrent diverticulitis, hematochezia, or bowel or regularity. She is to continue other medications as per oncology and primary care. Please copy Dr. [name] and Dr.Kahn in oncology.  Encounter Diagnoses  Name Primary?  . GI bleed Yes  .  Diverticulosis   . Anemia

## 2011-09-14 ENCOUNTER — Telehealth: Payer: Self-pay | Admitting: Gastroenterology

## 2011-09-14 NOTE — Telephone Encounter (Signed)
Pharmacist advised that patient may have #90 Dexilant with 3 refills. It was recently sent in as #30 with 11 refills to [facility]. Also, Dexilant needs a coverage review per [employer]. Patient has tried omeprazole and Nexium in the past and has GERD with history of esophageal stricture. Rx quantity has been approved until 09/13/13 case # [id number].  Rx dosage has been approved for 6 months. Case #[id number].

## 2011-09-18 ENCOUNTER — Encounter: Payer: 59 | Admitting: Gastroenterology

## 2011-09-18 ENCOUNTER — Telehealth: Payer: Self-pay | Admitting: *Deleted

## 2011-09-18 NOTE — Telephone Encounter (Signed)
Patient lives in [place] and thought she did not have to be here until 3:45 this evening.  Ursula Beath, RN discussed this with Dr. Jarold Motto and he chose for the patient to reschedule.  She states that her CT is scheduled for Wed. And she wishes to wait until the results of that test are available before rescheduling her EGD.

## 2011-09-20 ENCOUNTER — Ambulatory Visit
Admission: RE | Admit: 2011-09-20 | Discharge: 2011-09-20 | Disposition: A | Discharge status: Home / Self Care | Payer: 59 | Source: Ambulatory Visit | Attending: Gastroenterology | Admitting: Gastroenterology

## 2011-09-20 DIAGNOSIS — D649 Anemia, unspecified: Secondary | ICD-10-CM

## 2011-09-20 DIAGNOSIS — K922 Gastrointestinal hemorrhage, unspecified: Secondary | ICD-10-CM

## 2011-09-20 DIAGNOSIS — K5790 Diverticulosis of intestine, part unspecified, without perforation or abscess without bleeding: Secondary | ICD-10-CM

## 2011-10-04 ENCOUNTER — Ambulatory Visit (HOSPITAL_BASED_OUTPATIENT_CLINIC_OR_DEPARTMENT_OTHER): Payer: 59 | Admitting: Oncology

## 2011-10-04 ENCOUNTER — Encounter: Payer: Self-pay | Admitting: Oncology

## 2011-10-04 ENCOUNTER — Telehealth: Payer: Self-pay | Admitting: *Deleted

## 2011-10-04 ENCOUNTER — Other Ambulatory Visit (HOSPITAL_BASED_OUTPATIENT_CLINIC_OR_DEPARTMENT_OTHER): Payer: 59 | Admitting: Lab

## 2011-10-04 VITALS — BP 125/83 | HR 97 | Temp 98.6°F | Ht 63.5 in | Wt 161.9 lb

## 2011-10-04 DIAGNOSIS — Z170 Estrogen receptor positive status [ER+]: Secondary | ICD-10-CM

## 2011-10-04 DIAGNOSIS — C50919 Malignant neoplasm of unspecified site of unspecified female breast: Secondary | ICD-10-CM

## 2011-10-04 DIAGNOSIS — IMO0001 Reserved for inherently not codable concepts without codable children: Secondary | ICD-10-CM

## 2011-10-04 DIAGNOSIS — Z9010 Acquired absence of unspecified breast and nipple: Secondary | ICD-10-CM

## 2011-10-04 LAB — COMPREHENSIVE METABOLIC PANEL WITH GFR
ALT: 29 U/L (ref 0–35)
AST: 37 U/L (ref 0–37)
Albumin: 4.5 g/dL (ref 3.5–5.2)
Alkaline Phosphatase: 76 U/L (ref 39–117)
BUN: 17 mg/dL (ref 6–23)
CO2: 27 meq/L (ref 19–32)
Calcium: 9.8 mg/dL (ref 8.4–10.5)
Chloride: 104 meq/L (ref 96–112)
Creatinine, Ser: 0.87 mg/dL (ref 0.50–1.10)
Glucose, Bld: 106 mg/dL — ABNORMAL HIGH (ref 70–99)
Potassium: 4.7 meq/L (ref 3.5–5.3)
Sodium: 141 meq/L (ref 135–145)
Total Bilirubin: 0.3 mg/dL (ref 0.3–1.2)
Total Protein: 7.5 g/dL (ref 6.0–8.3)

## 2011-10-04 LAB — CBC WITH DIFFERENTIAL/PLATELET
BASO%: 0.7 % (ref 0.0–2.0)
Basophils Absolute: 0 10e3/uL (ref 0.0–0.1)
EOS%: 1.3 % (ref 0.0–7.0)
Eosinophils Absolute: 0.1 10e3/uL (ref 0.0–0.5)
HCT: 32.3 % — ABNORMAL LOW (ref 34.8–46.6)
HGB: 10.6 g/dL — ABNORMAL LOW (ref 11.6–15.9)
LYMPH%: 26.2 % (ref 14.0–49.7)
MCH: 25.7 pg (ref 25.1–34.0)
MCHC: 32.8 g/dL (ref 31.5–36.0)
MCV: 78.3 fL — ABNORMAL LOW (ref 79.5–101.0)
MONO#: 0.5 10e3/uL (ref 0.1–0.9)
MONO%: 8 % (ref 0.0–14.0)
NEUT#: 4 10e3/uL (ref 1.5–6.5)
NEUT%: 63.8 % (ref 38.4–76.8)
Platelets: 199 10e3/uL (ref 145–400)
RBC: 4.12 10e6/uL (ref 3.70–5.45)
RDW: 15.1 % — ABNORMAL HIGH (ref 11.2–14.5)
WBC: 6.3 10e3/uL (ref 3.9–10.3)
lymph#: 1.6 10e3/uL (ref 0.9–3.3)

## 2011-10-04 NOTE — Progress Notes (Signed)
OFFICE PROGRESS NOTE  CC  [name], MD [address] [place] [address], [place] [address] Dr. Harriette Bouillon  DIAGNOSIS: 66 year old female with stage I invasive ductal carcinoma of the left breast patient is status post bilateral mastectomies in February 2011.  PRIOR THERAPY:  #1 patient was diagnosed with invasive ductal carcinoma of the left breast.  #2 she went on to have therapeutic left mastectomy with prophylactic right mastectomy for 8 ER positive PR positive HER-2/neu negative invasive ductal carcinoma stage I in February 2011.  #3 she had immediate reconstruction performed.  #4 patient has been on Arimidex 1 mg daily  CURRENT THERAPY:Arimidex 1 mg daily  INTERVAL HISTORY: Valerie Ball 66 y.o. female returns for Followup visit today. She seems to be suffering from allergies she does have a runny I she also has aches and pains. She does have an underlying arthritis but she states that since she has been taking the Arimidex her aches and pains have significantly increased. She otherwise denies any nausea vomiting fevers chills no vaginal discharge or bleeding. Remainder of the 10 point review of systems is unremarkable  MEDICAL HISTORY: Past Medical History  Diagnosis Date  . Allergy     allegic rhinitis  . Anxiety   . GERD (gastroesophageal reflux disease)   . Hypertension   . Hyperlipidemia   . Seizure disorder   . RLS (restless legs syndrome)   . Arthritis   . Diverticulosis   . GI bleed   . Cataract   . Breast cancer 02/2009    breast CA L invasive ductal CA(hormone receptor positive.)  . Salmonella 05/2000    renal effects secondary to dehydration  . Hiatal hernia   . Melena 01/2008    anemia transfusion  . Esophageal stricture 02/04/2008  . Borderline diabetic     per patient medical history form  . Menopause     per patient medical history form  . Postmenopausal hormone therapy     per patient medical history form.  .  Blood transfusion   . Diabetes mellitus   . Osteoporosis   . Colon stricture 01/14/2003    ALLERGIES:  is allergic to penicillins.  MEDICATIONS:  Current Outpatient Prescriptions  Medication Sig Dispense Refill  . alendronate (FOSAMAX) 70 MG tablet Take 70 mg by mouth every 7 (seven) days. Take with a full glass of water on an empty stomach. Take on Sat.      Marland Kitchen anastrozole (ARIMIDEX) 1 MG tablet TAKE 1 TABLET DAILY  90 tablet  12  . Cholecalciferol (VITAMIN D-3 PO) Take 1 tablet by mouth daily.        Marland Kitchen dexlansoprazole (DEXILANT) 60 MG capsule Take 1 capsule (60 mg total) by mouth daily.  30 capsule  11  . enalapril (VASOTEC) 20 MG tablet TAKE 1 TABLET DAILY  90 tablet  3  . fluticasone (FLONASE) 50 MCG/ACT nasal spray 2 sprays by Nasal route daily as needed.        Marland Kitchen rOPINIRole (REQUIP) 1 MG tablet TAKE 1 TABLET AT BEDTIME  90 tablet  3  . sertraline (ZOLOFT) 100 MG tablet TAKE 1 TABLET DAILY  90 tablet  3  . simvastatin (ZOCOR) 10 MG tablet Take 0.5 tablets (5 mg total) by mouth daily.  45 tablet  3  . ZETIA 10 MG tablet TAKE 1 TABLET DAILY  90 tablet  3  . metFORMIN (GLUCOPHAGE) 500 MG tablet Take 500 mg by mouth 2 (two) times daily  with a meal.        . DISCONTD: cetirizine (ZYRTEC) 10 MG tablet Take 10 mg by mouth daily as needed. For allergy symptoms      . DISCONTD: hydrochlorothiazide 25 MG tablet Take 25 mg by mouth daily.          SURGICAL HISTORY:  Past Surgical History  Procedure Date  . Abdominal hysterectomy 1983  . Ovary surgery 1986    ovarian tumor removed  . Breast surgery 02/2009    breast biopsy invasive ductal CA  . Mastectomy 04/2009    Bilateral for ductal carcinoma on L    REVIEW OF SYSTEMS:  Pertinent items are noted in HPI.   PHYSICAL EXAMINATION: General appearance: alert, cooperative and appears stated age Lymph nodes: Cervical, supraclavicular, and axillary nodes normal. Resp: clear to auscultation bilaterally Back: symmetric, no curvature. ROM  normal. No CVA tenderness. Cardio: regular rate and rhythm, S1, S2 normal, no murmur, click, rub or gallop GI: soft, non-tender; bowel sounds normal; no masses,  no organomegaly Extremities: extremities normal, atraumatic, no cyanosis or edema Neurologic: Grossly normal  ECOG PERFORMANCE STATUS: 1 - Symptomatic but completely ambulatory  Blood pressure 125/83, pulse 97, temperature 98.6 F (37 C), temperature source Oral, height 5' 3.5" (1.613 m), weight 161 lb 14.4 oz (73.437 kg), last menstrual period 03/20/1981.  LABORATORY DATA: Lab Results  Component Value Date   WBC 6.3 10/04/2011   HGB 10.6* 10/04/2011   HCT 32.3* 10/04/2011   MCV 78.3* 10/04/2011   PLT 199 10/04/2011      Chemistry      Component Value Date/Time   NA 143 09/04/2011 1302   NA 135 10/14/2009 1409   K 4.2 09/04/2011 1302   K 3.7 10/14/2009 1409   CL 107 09/04/2011 1302   CL 96* 10/14/2009 1409   CO2 28 09/04/2011 1302   CO2 28 10/14/2009 1409   BUN 27* 09/04/2011 1302   BUN 16 10/14/2009 1409   CREATININE 0.7 09/04/2011 1302   CREATININE 0.8 10/14/2009 1409      Component Value Date/Time   CALCIUM 9.0 09/04/2011 1302   CALCIUM 9.2 10/14/2009 1409   ALKPHOS 64 09/04/2011 1302   ALKPHOS 75 10/14/2009 1409   AST 35 09/04/2011 1302   AST 98* 10/14/2009 1409   ALT 27 09/04/2011 1302   BILITOT 0.3 09/04/2011 1302   BILITOT 0.50 10/14/2009 1409       RADIOGRAPHIC STUDIES:  Ct Virtual Colonoscopy Screening  09/20/2011  *RADIOLOGY REPORT*  Clinical Data: Incomplete colonoscopy 5 years ago.  Now with diarrhea and constipation.  Abdominal pain and nausea.  History of breast cancer.  CT VIRTUAL COLONOSCOPY FOR SCREENING Technique:   The patient was given a standard LoSo bowel preparation with Gastrografin and barium for fluid and stool tagging respectively.  The quality of the bowel preparation is excellent.  Automated CO2 insufflation of the colon was performed prior to image acquisition and colonic distention is moderate. Image  post processing was used to generate a 3D endoluminal fly- through projection of the colon and to electronically subtract stool/fluid as appropriate.  Comparison:   None  Findings:  The sigmoid colon is affected by diverticular change with hypertrophic muscular wall thickening.  This results in inadequate distention of the proximal portion of the redundant sigmoid colon. The cecum, ascending colon, hepatic flexure, transverse colon, splenic flexure, and descending colon are well distended and clearly visualized.  5 - 6 mm tiny sessile polyp is identified in the cecum.  Otherwise no clinically significant colonic polyp or mass is identified in the colon.  Advanced diverticular changes are seen in the sigmoid colon.  No evidence for an overt colonic stricture.  IMPRESSION: 5-6 mm tiny sessile polyp in the cecum.   C-RADS category C-0 exam secondary to incomplete/inadequate visualization of the sigmoid colon.  This area could likely be visualized by flexible sigmoidoscopy.  Remaining segments of the colon are well evaluated.  Virtual colonoscopy is not designed to detect diminutive polyps (i.e., less than or equal to 5 mm), the presence or absence of which may not affect clinical management.  CT ABDOMEN AND PELVIS WITHOUT CONTRAST  Findings:  Moderate-to-large hiatal hernia noted with approximately 50% of the stomach in the chest.  No focal abnormality is seen within the liver or spleen on this study performed without intravenous contrast material.  Duodenum, pancreas, gallbladder, adrenal glands, and kidneys are unremarkable.  Imaging through the pelvis shows no free intraperitoneal fluid. There is no pelvic sidewall lymphadenopathy.  Uterus is surgically absent.  No adnexal mass.  IMPRESSION:  Unremarkable uninfused CT scan of the abdomen and pelvis.  Original Report Authenticated By: ERIC A. MANSELL, M.D.   Dg Abd 2 Views  09/04/2011  *RADIOLOGY REPORT*  Clinical Data: Epigastric pain.  ABDOMEN - 2 VIEW   Comparison: CT 03/25/2011  Findings: Moderate stool burden throughout the colon. There is normal bowel gas pattern.  No free air.  No organomegaly or suspicious calcification.  No acute bony abnormality.  IMPRESSION: Moderate stool burden.  No acute findings.  Original Report Authenticated By: Cyndie Chime, M.D.    ASSESSMENT: 66 year old female with  #1 stage I invasive ductal carcinoma of the left breast status post bilateral mastectomies for a ER positive cancer HER-2/neu negative. Patient subsequently underwent immediate reconstruction bilaterally.  #2 patient now with myalgias and arthralgias likely secondary to her adjuvant antiestrogen Arimidex.   PLAN:   #1 I have recommended holding the Arimidex at least for him month to see if her aches and pains subside. If they do then we will switch her to a different agent.  #2 I will plan on seeing the patient back in 6 months time.   All questions were answered. The patient knows to call the clinic with any problems, questions or concerns. We can certainly see the patient much sooner if necessary.  I spent 25 minutes counseling the patient face to face. The total time spent in the appointment was 30 minutes.    Drue Second, MD Medical/Oncology [facility] [phone] (beeper) [phone] (Office)  10/04/2011, 11:12 AM

## 2011-10-04 NOTE — Patient Instructions (Addendum)
1. Hold arimidex for a month to see if your aches and pains resolve. Call us in 1 month with an update  2. I will check Iron levels as you are a little anemic. We wil call you with the results  3. i will see you back in 6 months

## 2011-10-04 NOTE — Telephone Encounter (Signed)
Made patient appointment for 05-13-2012 starting at 10:00am printed out calendar and gave to the patient

## 2011-10-11 ENCOUNTER — Other Ambulatory Visit (INDEPENDENT_AMBULATORY_CARE_PROVIDER_SITE_OTHER): Payer: 59

## 2011-10-11 DIAGNOSIS — E119 Type 2 diabetes mellitus without complications: Secondary | ICD-10-CM

## 2011-10-13 ENCOUNTER — Other Ambulatory Visit: Payer: Self-pay | Admitting: Family Medicine

## 2011-10-13 NOTE — Telephone Encounter (Signed)
Ok, go ahead and deny it then, thanks

## 2011-10-13 NOTE — Telephone Encounter (Signed)
Request for Metformin 500 mg. It was stopped for 3 months to see if it was related to patient having diarrhea. Patient stated that she is still having mild diarrhea, but she does not want to take it right now.

## 2011-10-25 ENCOUNTER — Ambulatory Visit (INDEPENDENT_AMBULATORY_CARE_PROVIDER_SITE_OTHER): Payer: 59 | Admitting: Family Medicine

## 2011-10-25 ENCOUNTER — Encounter: Payer: Self-pay | Admitting: Family Medicine

## 2011-10-25 VITALS — BP 130/78 | HR 72 | Temp 98.1°F | Ht 64.0 in | Wt 162.5 lb

## 2011-10-25 DIAGNOSIS — R52 Pain, unspecified: Secondary | ICD-10-CM

## 2011-10-25 DIAGNOSIS — F430 Acute stress reaction: Secondary | ICD-10-CM

## 2011-10-25 DIAGNOSIS — F438 Other reactions to severe stress: Secondary | ICD-10-CM

## 2011-10-25 DIAGNOSIS — I10 Essential (primary) hypertension: Secondary | ICD-10-CM

## 2011-10-25 DIAGNOSIS — R1013 Epigastric pain: Secondary | ICD-10-CM

## 2011-10-25 DIAGNOSIS — E119 Type 2 diabetes mellitus without complications: Secondary | ICD-10-CM

## 2011-10-25 DIAGNOSIS — D649 Anemia, unspecified: Secondary | ICD-10-CM

## 2011-10-25 HISTORY — DX: Acute stress reaction: F43.0

## 2011-10-25 NOTE — Assessment & Plan Note (Signed)
bp in fair control at this time  No changes needed  Disc lifstyle change with low sodium diet and exercise  F/u 3 mo   

## 2011-10-25 NOTE — Assessment & Plan Note (Signed)
Pt for EGD soon - no acute bleeding known  On dexilant with some relief Will finish GI w/u Anemia is stable

## 2011-10-25 NOTE — Assessment & Plan Note (Signed)
This is controlled currently without med - a1c 6.5-stressed imp of diet and exercise  Intol of metformin

## 2011-10-25 NOTE — Assessment & Plan Note (Signed)
This is stable Will start ferrous sulfate 325 bid if tolerated F/u 3 mo  Will finish GI w/u

## 2011-10-25 NOTE — Progress Notes (Signed)
Subjective:    Patient ID: Valerie Ball, female    DOB: 1945/12/31, 66 y.o.   MRN: [id number]  HPI Here for f/u of chronic problems  Is feeling better than she was the last time   Last visit - epigastric pain / heme pos stool Lab Results  Component Value Date   WBC 6.3 10/04/2011   HGB 10.6* 10/04/2011   HCT 32.3* 10/04/2011   MCV 78.3* 10/04/2011   PLT 199 10/04/2011   saw GI - did CT scan and found nothing but a small polyp  Has not done EGD at this point -- had an issue with the time of appt  Needs to re schedule that  Stomach is less painful but it hurts all the time- a knawing pain  Is on dexilant  Bought some iron - did not start taking it yet    a1c is stable 6.5 without metformin Not a significant increase- will stay off of it  Diet is usually pretty good - but has been too tired to go to the gym much Has been swimming , and walking the dogs   bp is   ok  Today BP Readings from Last 3 Encounters:  10/25/11 130/78  10/04/11 125/83  09/08/11 128/80    No cp or palpitations or headaches or edema  No side effects to medicines    Stress is sometimes significant  Is an adjustment to have her husband working at home  Husband is not motivated and has not paid taxes in 3 years- worried about that   Patient Active Problem List  Diagnosis  . BREAST CANCER  . DIABETES MELLITUS, TYPE II  . HYPERLIPIDEMIA  . UNSPECIFIED ANEMIA  . ANXIETY  . DISORDERS, ORGANIC SLEEP NEC  . SYNDROME, RESTLESS LEGS  . SYNDROME, CARPAL TUNNEL  . HYPERTENSION  . ALLERGIC RHINITIS  . ESOPHAGEAL STRICTURE  . GERD  . HIATAL HERNIA  . DIVERTICULOSIS-COLON  . PERITONEAL ADHESIONS  . FATTY LIVER DISEASE  . SEIZURE DISORDER  . TRANSAMINASES, SERUM, ELEVATED  . ADVERSE DRUG REACTION  . Routine general medical examination at a health care facility  . Abdominal pain, acute, epigastric   Past Medical History  Diagnosis Date  . Allergy     allegic rhinitis  . Anxiety   . GERD  (gastroesophageal reflux disease)   . Hypertension   . Hyperlipidemia   . Seizure disorder   . RLS (restless legs syndrome)   . Arthritis   . Diverticulosis   . GI bleed   . Cataract   . Breast cancer 02/2009    breast CA L invasive ductal CA(hormone receptor positive.)  . Salmonella 05/2000    renal effects secondary to dehydration  . Hiatal hernia   . Melena 01/2008    anemia transfusion  . Esophageal stricture 02/04/2008  . Borderline diabetic     per patient medical history form  . Menopause     per patient medical history form  . Postmenopausal hormone therapy     per patient medical history form.  . Blood transfusion   . Diabetes mellitus   . Osteoporosis   . Colon stricture 01/14/2003   Past Surgical History  Procedure Date  . Abdominal hysterectomy 1983  . Ovary surgery 1986    ovarian tumor removed  . Breast surgery 02/2009    breast biopsy invasive ductal CA  . Mastectomy 04/2009    Bilateral for ductal carcinoma on L   History  Substance Use Topics  .  Smoking status: Never Smoker   . Smokeless tobacco: Never Used  . Alcohol Use: No   Family History  Problem Relation Age of Onset  . Heart attack Father   . Esophageal cancer Father   . Diabetes Father   . Breast cancer Sister   . Diabetes Sister   . Hypertension Brother   . Diabetes Brother   . Breast cancer Sister   . Hypertension Mother   . Stroke Mother   . Hypertension Sister    Allergies  Allergen Reactions  . Penicillins Rash   Current Outpatient Prescriptions on File Prior to Visit  Medication Sig Dispense Refill  . alendronate (FOSAMAX) 70 MG tablet Take 70 mg by mouth every 7 (seven) days. Take with a full glass of water on an empty stomach. Take on Sat.      . Cholecalciferol (VITAMIN D-3 PO) Take 1 tablet by mouth daily.        . enalapril (VASOTEC) 20 MG tablet TAKE 1 TABLET DAILY  90 tablet  3  . fluticasone (FLONASE) 50 MCG/ACT nasal spray 2 sprays by Nasal route daily as  needed.        Marland Kitchen rOPINIRole (REQUIP) 1 MG tablet TAKE 1 TABLET AT BEDTIME  90 tablet  3  . sertraline (ZOLOFT) 100 MG tablet TAKE 1 TABLET DAILY  90 tablet  3  . ZETIA 10 MG tablet TAKE 1 TABLET DAILY  90 tablet  3  . anastrozole (ARIMIDEX) 1 MG tablet TAKE 1 TABLET DAILY  90 tablet  12  . dexlansoprazole (DEXILANT) 60 MG capsule Take 1 capsule (60 mg total) by mouth daily.  30 capsule  11  . metFORMIN (GLUCOPHAGE) 500 MG tablet Take 500 mg by mouth 2 (two) times daily with a meal.        . simvastatin (ZOCOR) 10 MG tablet Take 0.5 tablets (5 mg total) by mouth daily.  45 tablet  3  . DISCONTD: cetirizine (ZYRTEC) 10 MG tablet Take 10 mg by mouth daily as needed. For allergy symptoms      . DISCONTD: hydrochlorothiazide 25 MG tablet Take 25 mg by mouth daily.            Review of Systems Review of Systems  Constitutional: Negative for fever, appetite change, fatigue and unexpected weight change.  Eyes: Negative for pain and visual disturbance.  Respiratory: Negative for cough and shortness of breath.   Cardiovascular: Negative for cp or palpitations    Gastrointestinal: Negative for nausea, diarrhea and constipation. pos for epigastric pain/ heartburn Genitourinary: Negative for urgency and frequency.  Skin: Negative for pallor or rash   Neurological: Negative for weakness, light-headedness, numbness and headaches.  Hematological: Negative for adenopathy. Does not bruise/bleed easily.  Psychiatric/Behavioral: Negative for dysphoric mood. The patient is anxious over issues at home / marital/ comm         Objective:   Physical Exam  Constitutional: She appears well-developed and well-nourished. No distress.  HENT:  Head: Normocephalic and atraumatic.  Mouth/Throat: Oropharynx is clear and moist.  Eyes: Conjunctivae and EOM are normal. Pupils are equal, round, and reactive to light. No scleral icterus.  Neck: Normal range of motion. Neck supple. No JVD present. Carotid bruit is not  present. No thyromegaly present.  Cardiovascular: Normal rate, regular rhythm, normal heart sounds and intact distal pulses.  Exam reveals no gallop.   Pulmonary/Chest: Effort normal and breath sounds normal. No respiratory distress. She has no wheezes.  Abdominal: Soft. Bowel sounds are  normal. She exhibits no distension, no abdominal bruit and no mass. There is tenderness.  Musculoskeletal: She exhibits no edema and no tenderness.  Lymphadenopathy:    She has no cervical adenopathy.  Neurological: She is alert. She has normal reflexes. No cranial nerve deficit. She exhibits normal muscle tone. Coordination normal.  Skin: Skin is warm and dry. No rash noted. No erythema. No pallor.  Psychiatric: Her speech is normal and behavior is normal. Thought content normal. Her mood appears anxious. Cognition and memory are normal.       Seems fatigued Speaks frankly about stressors           Assessment & Plan:

## 2011-10-25 NOTE — Assessment & Plan Note (Signed)
Long disc about stressors at home Very much needs couples counseling  She will talk to her spouse and call us for a referral

## 2011-10-25 NOTE — Patient Instructions (Signed)
Make sure to re schedule your endoscopy and GI follow up  Take ferrous sulfate 325 mg 1 pill twice daily with food over the counter - update me if this makes you too constipated -- a stool softener is ok if needed  Sugar level is ok without metformin- watch diet and stay off of it  Follow up with me in 3 months please  Please talk to your husband about couples counseling - I think it is very very important - and then call us to do a referral

## 2011-11-03 ENCOUNTER — Encounter: Payer: Self-pay | Admitting: Gastroenterology

## 2011-11-03 ENCOUNTER — Ambulatory Visit (AMBULATORY_SURGERY_CENTER): Payer: 59 | Admitting: Gastroenterology

## 2011-11-03 VITALS — BP 162/104 | HR 83 | Temp 98.9°F | Resp 18 | Ht 64.0 in | Wt 162.0 lb

## 2011-11-03 DIAGNOSIS — D649 Anemia, unspecified: Secondary | ICD-10-CM

## 2011-11-03 DIAGNOSIS — K449 Diaphragmatic hernia without obstruction or gangrene: Secondary | ICD-10-CM

## 2011-11-03 DIAGNOSIS — R1013 Epigastric pain: Secondary | ICD-10-CM

## 2011-11-03 DIAGNOSIS — K922 Gastrointestinal hemorrhage, unspecified: Secondary | ICD-10-CM

## 2011-11-03 DIAGNOSIS — R52 Pain, unspecified: Secondary | ICD-10-CM

## 2011-11-03 DIAGNOSIS — K259 Gastric ulcer, unspecified as acute or chronic, without hemorrhage or perforation: Secondary | ICD-10-CM

## 2011-11-03 MED ORDER — SODIUM CHLORIDE 0.9 % IV SOLN: 500.0000 mL | INTRAVENOUS | Status: DC

## 2011-11-03 NOTE — Op Note (Signed)
[facility] [address] [place], [place]  [address]  ENDOSCOPY PROCEDURE REPORT  PATIENT:  Valerie, Ball  MR#:  [id number] BIRTHDATE:  Apr 04, 1945, 66 yrs. old  GENDER:  female  ENDOSCOPIST:  Vania Rea. Jarold Motto, MD, [facility] Referred by:  PROCEDURE DATE:  11/03/2011 PROCEDURE:  EGD, diagnostic 24401 ASA CLASS:  Class II INDICATIONS:  PERSISTENT CHEST/EPIGASTRIC PAIN.  MEDICATIONS:   propofol (Diprivan) 150 mg IV, robinal 0.2 mg IV TOPICAL ANESTHETIC:  Cetacaine Spray  DESCRIPTION OF PROCEDURE:   After the risks and benefits of the procedure were explained, informed consent was obtained.  The LB [id number]-H180 [id number] endoscope was introduced through the mouth and advanced to the second portion of the duodenum.  The instrument was slowly withdrawn as the mucosa was fully examined. <<PROCEDUREIMAGES>>  A hiatal hernia was found. LARGE 10 CM. HH AND LINEAR CAMERON EROSIONS NOTED.  Otherwise the examination was normal. Retroflexed views revealed a hiatal hernia.    The scope was then withdrawn from the patient and the procedure completed.  COMPLICATIONS:  None  ENDOSCOPIC IMPRESSION: 1) Hiatal hernia 2) Otherwise normal examination LARGE SYMPTOMATIC SLIDING HIATIAL HERNIA,RECENT GI BLEED FROM CAMERON EROSIONS IN LARGE HERNIA. RECOMMENDATIONS: 1.CONTINUE DAILY PPI 2.SURGICAL REFERRAL FOR HERNIA REPAIR  ______________________________ Vania Rea. Jarold Motto, MD, Clement[facility] Graham  CC:  [name], MD, Luretha Murphy, MD  n. eSIGNED:   Vania Rea. [name] at 11/03/2011 02:36 PM  Leeroy Bock, [phone]

## 2011-11-03 NOTE — Progress Notes (Signed)
Patient did not experience any of the following events: a burn prior to discharge; a fall within the facility; wrong site/side/patient/procedure/implant event; or a hospital transfer or hospital admission upon discharge from the facility. (G8907) Patient did not have preoperative order for IV antibiotic SSI prophylaxis. (G8918)  

## 2011-11-03 NOTE — Patient Instructions (Addendum)
Discharge instructions given with verbal understanding. Handout on a hiatal hernia. Resume previous medications. YOU HAD AN ENDOSCOPIC PROCEDURE TODAY AT THE [facility]: Refer to the procedure report that was given to you for any specific questions about what was found during the examination.  If the procedure report does not answer your questions, please call your gastroenterologist to clarify.  If you requested that your care partner not be given the details of your procedure findings, then the procedure report has been included in a sealed envelope for you to review at your convenience later.  YOU SHOULD EXPECT: Some feelings of bloating in the abdomen. Passage of more gas than usual.  Walking can help get rid of the air that was put into your GI tract during the procedure and reduce the bloating. If you had a lower endoscopy (such as a colonoscopy or flexible sigmoidoscopy) you may notice spotting of blood in your stool or on the toilet paper. If you underwent a bowel prep for your procedure, then you may not have a normal bowel movement for a few days.  DIET: Your first meal following the procedure should be a light meal and then it is ok to progress to your normal diet.  A half-sandwich or bowl of soup is an example of a good first meal.  Heavy or fried foods are harder to digest and may make you feel nauseous or bloated.  Likewise meals heavy in dairy and vegetables can cause extra gas to form and this can also increase the bloating.  Drink plenty of fluids but you should avoid alcoholic beverages for 24 hours.  ACTIVITY: Your care partner should take you home directly after the procedure.  You should plan to take it easy, moving slowly for the rest of the day.  You can resume normal activity the day after the procedure however you should NOT DRIVE or use heavy machinery for 24 hours (because of the sedation medicines used during the test).    SYMPTOMS TO REPORT IMMEDIATELY: A  gastroenterologist can be reached at any hour.  During normal business hours, 8:30 AM to 5:00 PM Monday through Friday, call [phone].  After hours and on weekends, please call the GI answering service at [phone] who will take a message and have the physician on call contact you.   Following upper endoscopy (EGD)  Vomiting of blood or coffee ground material  New chest pain or pain under the shoulder blades  Painful or persistently difficult swallowing  New shortness of breath  Fever of 100F or higher  Black, tarry-looking stools  FOLLOW UP: If any biopsies were taken you will be contacted by phone or by letter within the next 1-3 weeks.  Call your gastroenterologist if you have not heard about the biopsies in 3 weeks.  Our staff will call the home number listed on your records the next business day following your procedure to check on you and address any questions or concerns that you may have at that time regarding the information given to you following your procedure. This is a courtesy call and so if there is no answer at the home number and we have not heard from you through the emergency physician on call, we will assume that you have returned to your regular daily activities without incident.  SIGNATURES/CONFIDENTIALITY: You and/or your care partner have signed paperwork which will be entered into your electronic medical record.  These signatures attest to the fact that that the information above   on your After Visit Summary has been reviewed and is understood.  Full responsibility of the confidentiality of this discharge information lies with you and/or your care-partner. 

## 2011-11-06 ENCOUNTER — Telehealth: Payer: Self-pay | Admitting: *Deleted

## 2011-11-06 NOTE — Telephone Encounter (Signed)
  Follow up Call-  Call back number 11/03/2011  Post procedure Call Back phone  # [phone]  Permission to leave phone message Yes     Patient questions:  Do you have a fever, pain , or abdominal swelling? no Pain Score  0 *  Have you tolerated food without any problems? yes  Have you been able to return to your normal activities? yes  Do you have any questions about your discharge instructions: Diet   no Medications  no Follow up visit  no  Do you have questions or concerns about your Care? yes  Actions: * If pain score is 4 or above: No action needed, pain <4.

## 2011-11-09 ENCOUNTER — Telehealth: Payer: Self-pay | Admitting: Gastroenterology

## 2011-11-09 DIAGNOSIS — R079 Chest pain, unspecified: Secondary | ICD-10-CM

## 2011-11-09 DIAGNOSIS — K449 Diaphragmatic hernia without obstruction or gangrene: Secondary | ICD-10-CM

## 2011-11-09 DIAGNOSIS — R1013 Epigastric pain: Secondary | ICD-10-CM

## 2011-11-09 NOTE — Telephone Encounter (Signed)
Notified pt I have sent for a surgical referral, however, she will need a BS and Small Bowel Follow Thru 1st. I scheduled her tests for Monday, 11/13/11 at 09:15am at [facility] and NPO after midnight. Pt stated understanding.

## 2011-11-09 NOTE — Telephone Encounter (Signed)
Notified pt of her appt with Dr Wenda Low on 12/06/11 at 2:40pm; pt given directions and she stated understanding.

## 2011-11-13 ENCOUNTER — Other Ambulatory Visit (HOSPITAL_COMMUNITY): Payer: 59

## 2011-11-13 ENCOUNTER — Ambulatory Visit (HOSPITAL_COMMUNITY)
Admission: RE | Admit: 2011-11-13 | Discharge: 2011-11-13 | Disposition: A | Discharge status: Home / Self Care | Payer: 59 | Source: Ambulatory Visit | Attending: Gastroenterology | Admitting: Gastroenterology

## 2011-11-13 DIAGNOSIS — K449 Diaphragmatic hernia without obstruction or gangrene: Secondary | ICD-10-CM

## 2011-11-13 DIAGNOSIS — R1013 Epigastric pain: Secondary | ICD-10-CM

## 2011-11-13 DIAGNOSIS — K224 Dyskinesia of esophagus: Secondary | ICD-10-CM | POA: Insufficient documentation

## 2011-11-13 DIAGNOSIS — R079 Chest pain, unspecified: Secondary | ICD-10-CM

## 2011-12-06 ENCOUNTER — Encounter (INDEPENDENT_AMBULATORY_CARE_PROVIDER_SITE_OTHER): Payer: Self-pay | Admitting: Surgery

## 2011-12-06 ENCOUNTER — Ambulatory Visit (INDEPENDENT_AMBULATORY_CARE_PROVIDER_SITE_OTHER): Payer: 59 | Admitting: Surgery

## 2011-12-06 VITALS — BP 162/96 | HR 96 | Resp 20 | Ht 64.5 in | Wt 161.0 lb

## 2011-12-06 DIAGNOSIS — K449 Diaphragmatic hernia without obstruction or gangrene: Secondary | ICD-10-CM

## 2011-12-06 HISTORY — DX: Diaphragmatic hernia without obstruction or gangrene: K44.9

## 2011-12-06 NOTE — Patient Instructions (Signed)
Nissen Fundoplication Care After Please read the instructions outlined below and refer to this sheet for the next few weeks. These discharge instructions provide you with general information on caring for yourself after you leave the hospital. Your doctor may also give you specific instructions. While your treatment has been planned according to the most current medical practices available, unavoidable complications sometimes happen. If you have any problems or questions after discharge, please call your doctor. ACTIVITY  Take frequent rest periods throughout the day.   Take frequent walks throughout the day. This will help to prevent blood clots.   Continue to do your coughing and deep breathing exercises once you get home. This will help to prevent pneumonia.   No strenuous activities such as heavy lifting, pushing or pulling until after your follow-up visit with your doctor. Do not lift anything heavier than 10 pounds.   Talk with your caregiver about when you may return to work and your exercise routine.   You may shower 2 days after surgery. Pat incisions dry. Do not rub incisions with washcloth or towel.   Do not drive while taking prescription pain medication.  NUTRITION  Continue with a liquid diet, or the diet you were directed to take, until your first follow-up visit with your surgeon.   Drink fluids (6-8 glasses a day).   Call your caregiver for persistent nausea (feeling sick to your stomach), vomiting, bloating or difficulty swallowing.  ELIMINATION It is very important not to strain during bowel movements. If constipation should occur, you may:  Take a mild laxative (such as Milk of Magnesia).   Add fruit and bran to your diet.   Drink more fluids.   Call your caregiver if constipation is not relieved.  FEVER If you feel feverish or have shaking chills, take your temperature. If it is 102 F (38.9 C) or above, call your caregiver. The fever may mean there is an  infection. PAIN CONTROL  If a prescription was given for a pain reliever, please follow your caregiver's directions.   Only take over-the-counter or prescription medicines for pain, discomfort, or fever as directed by your caregiver.   If the pain is not relieved by your medicine, becomes worse, or you have difficulty breathing, call your doctor.  INCISION  It is normal for your cuts (incisions) from surgery to have a small amount of drainage for the first 1-2 days. Once the drainage has stopped, leave your incision(s) open to air.   Check your incision(s) and surrounding area daily for any redness, swelling, increased drainage or bleeding. If any of these are present or if the wound edges start to separate, call your doctor.   If you have small adhesive strips in place, they will peel and fall off. (If these strips are covered with a clear bandage, your doctor will tell you when to remove them.)   If you have staples, your caregiver will remove them at the follow-up appointment.  Document Released: 10/28/2003 Document Revised: 02/23/2011 Document Reviewed: 01/31/2007 [employer] Patient Information 2012 [employer]. 

## 2011-12-06 NOTE — Progress Notes (Signed)
Chief Complaint:  Large type iii mixed hiatus hernia with anemia  History of Present Illness:  Valerie Ball is an 66 y.o. female from [facility] comes for evaluation.  She indicates that she has had reflux for probably 30 years. Recently she has been getting more problems with anemia and on endoscopy by Dr. Jarold Motto was found to have significant hiatal hernia and a lot of her stomach in her chest.  I discussed this with her and her husband in detail including diagramming how we would do a hiatal hernia repair and Nissen fundoplication. I gave him a booklet and discussed the complications unlimited wrap failure as well as other complications that can cause [place] to have to do this procedure open. They want to go ahead and get this scheduled.  To go on prepaid vacation so he'll probably be some time at least in October for we can do her surgery.  Past Medical History  Diagnosis Date  . Allergy     allegic rhinitis  . Anxiety   . GERD (gastroesophageal reflux disease)   . Hypertension   . Hyperlipidemia   . Seizure disorder   . RLS (restless legs syndrome)   . Arthritis   . Diverticulosis   . GI bleed   . Cataract   . Breast cancer 02/2009    breast CA L invasive ductal CA(hormone receptor positive.)  . Salmonella 05/2000    renal effects secondary to dehydration  . Hiatal hernia   . Melena 01/2008    anemia transfusion  . Esophageal stricture 02/04/2008  . Borderline diabetic     per patient medical history form  . Menopause     per patient medical history form  . Postmenopausal hormone therapy     per patient medical history form.  . Blood transfusion   . Diabetes mellitus   . Osteoporosis   . Colon stricture 01/14/2003    Past Surgical History  Procedure Date  . Abdominal hysterectomy 1983  . Ovary surgery 1986    ovarian tumor removed  . Breast surgery 02/2009    breast biopsy invasive ductal CA  . Mastectomy 04/2009    Bilateral for ductal carcinoma on L     Current Outpatient Prescriptions  Medication Sig Dispense Refill  . alendronate (FOSAMAX) 70 MG tablet Take 70 mg by mouth every 7 (seven) days. Take with a full glass of water on an empty stomach. Take on Sat.      . Cholecalciferol (VITAMIN D-3 PO) Take 1 tablet by mouth daily.        Marland Kitchen dexlansoprazole (DEXILANT) 60 MG capsule Take 60 mg by mouth daily.      . enalapril (VASOTEC) 20 MG tablet TAKE 1 TABLET DAILY  90 tablet  3  . ferrous sulfate 325 (65 FE) MG tablet Take 325 mg by mouth 2 (two) times daily.      . fluticasone (FLONASE) 50 MCG/ACT nasal spray 2 sprays by Nasal route daily as needed.        Marland Kitchen rOPINIRole (REQUIP) 1 MG tablet TAKE 1 TABLET AT BEDTIME  90 tablet  3  . sertraline (ZOLOFT) 100 MG tablet TAKE 1 TABLET DAILY  90 tablet  3  . simvastatin (ZOCOR) 10 MG tablet Take 0.5 tablets (5 mg total) by mouth daily.  45 tablet  3  . ZETIA 10 MG tablet TAKE 1 TABLET DAILY  90 tablet  3  . DISCONTD: dexlansoprazole (DEXILANT) 60 MG capsule Take 1 capsule (60 mg  total) by mouth daily.  30 capsule  11  . DISCONTD: cetirizine (ZYRTEC) 10 MG tablet Take 10 mg by mouth daily as needed. For allergy symptoms      . DISCONTD: hydrochlorothiazide 25 MG tablet Take 25 mg by mouth daily.         Arimidex; Metformin and related; and Penicillins Family History  Problem Relation Age of Onset  . Heart attack Father   . Esophageal cancer Father   . Diabetes Father   . Breast cancer Sister   . Diabetes Sister   . Hypertension Brother   . Diabetes Brother   . Breast cancer Sister   . Hypertension Mother   . Stroke Mother   . Breast cancer Mother   . Hypertension Sister    Social History:   reports that she has never smoked. She has never used smokeless tobacco. She reports that she does not drink alcohol or use illicit drugs.   REVIEW OF SYSTEMS - PERTINENT POSITIVES ONLY: Allergy to penicillin  Physical Exam:   Blood pressure 162/96, pulse 96, resp. rate 20, height 5' 4.5"  (1.638 m), weight 161 lb (73.029 kg), last menstrual period 03/20/1981. Body mass index is 27.21 kg/(m^2).  Gen:  WDWN WF NAD  Neurological: Alert and oriented to person, place, and time. Motor and sensory function is grossly intact  Head: Normocephalic and atraumatic.  Eyes: Conjunctivae are normal. Pupils are equal, round, and reactive to light. No scleral icterus.  Neck: Normal range of motion. Neck supple. No tracheal deviation or thyromegaly present.   Breast-post bilateral mastectomies Cardiovascular:  SR without murmurs or gallops.  No carotid bruits Respiratory: Effort normal.  No respiratory distress. No chest wall tenderness. Breath sounds normal.  No wheezes, rales or rhonchi.  Abdomen:  nontender GU: Musculoskeletal: Normal range of motion. Extremities are nontender. No cyanosis, edema or clubbing noted Lymphadenopathy: No cervical, preauricular, postauricular or axillary adenopathy is present Skin: Skin is warm and dry. No rash noted. No diaphoresis. No erythema. No pallor. Pscyh: Normal mood and affect. Behavior is normal. Judgment and thought content normal.   LABORATORY RESULTS: No results found for this or any previous visit (from the past 48 hour(s)).  RADIOLOGY RESULTS: No results found.  Problem List: Patient Active Problem List  Diagnosis  . BREAST CANCER  . Hyperglycemia  . HYPERLIPIDEMIA  . UNSPECIFIED ANEMIA  . ANXIETY  . DISORDERS, ORGANIC SLEEP NEC  . SYNDROME, RESTLESS LEGS  . SYNDROME, CARPAL TUNNEL  . HYPERTENSION  . ALLERGIC RHINITIS  . ESOPHAGEAL STRICTURE  . GERD  . HIATAL HERNIA  . DIVERTICULOSIS-COLON  . PERITONEAL ADHESIONS  . FATTY LIVER DISEASE  . SEIZURE DISORDER  . TRANSAMINASES, SERUM, ELEVATED  . ADVERSE DRUG REACTION  . Routine general medical examination at a health care facility  . Abdominal pain, acute, epigastric  . Stress reaction  . Large type III mixed hiatus hernia     Assessment & Plan: Large type III mixed hiatus  hernia. Laparoscopic repair and Nissen fundoplication    [name], MD, [facility] [phone] beeper [phone]  12/06/2011 4:10 PM

## 2011-12-12 ENCOUNTER — Encounter (HOSPITAL_COMMUNITY): Payer: Self-pay | Admitting: Pharmacy Technician

## 2011-12-29 ENCOUNTER — Encounter (HOSPITAL_COMMUNITY)
Admission: RE | Admit: 2011-12-29 | Discharge: 2011-12-29 | Disposition: A | Discharge status: Home / Self Care | Payer: 59 | Source: Ambulatory Visit | Attending: Surgery | Admitting: Surgery

## 2011-12-29 ENCOUNTER — Ambulatory Visit (HOSPITAL_COMMUNITY)
Admission: RE | Admit: 2011-12-29 | Discharge: 2011-12-29 | Disposition: A | Discharge status: Home / Self Care | Payer: 59 | Source: Ambulatory Visit | Attending: Surgery | Admitting: Surgery

## 2011-12-29 ENCOUNTER — Encounter (HOSPITAL_COMMUNITY): Payer: Self-pay

## 2011-12-29 ENCOUNTER — Other Ambulatory Visit: Payer: Self-pay

## 2011-12-29 DIAGNOSIS — Z01812 Encounter for preprocedural laboratory examination: Secondary | ICD-10-CM | POA: Insufficient documentation

## 2011-12-29 DIAGNOSIS — K449 Diaphragmatic hernia without obstruction or gangrene: Secondary | ICD-10-CM | POA: Insufficient documentation

## 2011-12-29 DIAGNOSIS — Z01818 Encounter for other preprocedural examination: Secondary | ICD-10-CM | POA: Insufficient documentation

## 2011-12-29 DIAGNOSIS — Z01810 Encounter for preprocedural cardiovascular examination: Secondary | ICD-10-CM | POA: Insufficient documentation

## 2011-12-29 DIAGNOSIS — I252 Old myocardial infarction: Secondary | ICD-10-CM | POA: Insufficient documentation

## 2011-12-29 HISTORY — DX: Unspecified convulsions: R56.9

## 2011-12-29 HISTORY — DX: Anemia, unspecified: D64.9

## 2011-12-29 LAB — SURGICAL PCR SCREEN
MRSA, PCR: NEGATIVE
Staphylococcus aureus: POSITIVE — AB

## 2011-12-29 LAB — CBC
MCHC: 32.3 g/dL (ref 30.0–36.0)
Platelets: 168 10*3/uL (ref 150–400)
RDW: 17.6 % — ABNORMAL HIGH (ref 11.5–15.5)
WBC: 5.8 10*3/uL (ref 4.0–10.5)

## 2011-12-29 LAB — BASIC METABOLIC PANEL
Chloride: 103 mEq/L (ref 96–112)
GFR calc Af Amer: >90 mL/min (ref >90)
GFR calc non Af Amer: 86 mL/min — ABNORMAL LOW (ref >90)
Potassium: 4.3 mEq/L (ref 3.5–5.1)
Sodium: 142 mEq/L (ref 135–145)

## 2011-12-29 MED ORDER — FLEET ENEMA 7-19 GM/118ML RE ENEM: 1.0000 | ENEMA | Freq: Once | RECTAL | Status: DC

## 2011-12-29 NOTE — Pre-Procedure Instructions (Signed)
CBC, BMET, EKG, CXR WERE DONE TODAY - PREOP- AT [facility] AS PER ANESTHESIOLOGIST'S GUIDELINES. 

## 2011-12-29 NOTE — Pre-Procedure Instructions (Signed)
PT'S PREOP EKG ABNORMAL-SHOWN TO DR. GARIGNAN AND DR. GERMEROTH AND EKG FELT TO BE OK FOR PT'S SURGERY - AND I SENT A NOTE TO DR. MARTIN -IN [facility] EKG OK WITH ANESTHESIOLOGISTS.

## 2011-12-29 NOTE — Patient Instructions (Signed)
YOUR SURGERY IS SCHEDULED AT [facility]  ON:  Tuesday  10/15  AT 12:00 PM  REPORT TO [facility] AT:  10:00 AM      PHONE # FOR SHORT STAY IS [phone]               FLEETS ENEMA NIGHT BEFORE YOUR SURGERY.  DO NOT EAT OR DRINK ANYTHING AFTER MIDNIGHT THE NIGHT BEFORE YOUR SURGERY.  YOU MAY BRUSH YOUR TEETH, RINSE OUT YOUR MOUTH--BUT NO WATER, NO FOOD, NO CHEWING GUM, NO MINTS, NO CANDIES, NO CHEWING TOBACCO.  PLEASE TAKE THE FOLLOWING MEDICATIONS THE AM OF YOUR SURGERY WITH A FEW SIPS OF WATER:  DEXILANT.   MAY USE FLONASE IF NEEDED.   IF YOU USE INHALERS--USE YOUR INHALERS THE AM OF YOUR SURGERY AND BRING INHALERS TO THE HOSPITAL -TAKE TO SURGERY.    IF YOU ARE DIABETIC:  DO NOT TAKE ANY DIABETIC MEDICATIONS THE AM OF YOUR SURGERY.  IF YOU TAKE INSULIN IN THE EVENINGS--PLEASE ONLY TAKE 1/2 NORMAL EVENING DOSE THE NIGHT BEFORE YOUR SURGERY.  NO INSULIN THE AM OF YOUR SURGERY.  IF YOU HAVE SLEEP APNEA AND USE CPAP OR BIPAP--PLEASE BRING THE MASK AND THE TUBING.  DO NOT BRING YOUR MACHINE.  DO NOT BRING VALUABLES, MONEY, CREDIT CARDS.  DO NOT WEAR JEWELRY, MAKE-UP, NAIL POLISH AND NO METAL PINS OR CLIPS IN YOUR HAIR. CONTACT LENS, DENTURES / PARTIALS, GLASSES SHOULD NOT BE WORN TO SURGERY AND IN MOST CASES-HEARING AIDS WILL NEED TO BE REMOVED.  BRING YOUR GLASSES CASE, ANY EQUIPMENT NEEDED FOR YOUR CONTACT LENS. FOR PATIENTS ADMITTED TO THE HOSPITAL--CHECK OUT TIME THE DAY OF DISCHARGE IS 11:00 AM.  ALL INPATIENT ROOMS ARE PRIVATE - WITH BATHROOM, TELEPHONE, TELEVISION AND WIFI INTERNET.  IF YOU ARE BEING DISCHARGED THE SAME DAY OF YOUR SURGERY--YOU CAN NOT DRIVE YOURSELF HOME--AND SHOULD NOT GO HOME ALONE BY TAXI OR BUS.  NO DRIVING OR OPERATING MACHINERY FOR 24 HOURS FOLLOWING ANESTHESIA / PAIN MEDICATIONS.  PLEASE MAKE ARRANGEMENTS FOR SOMEONE TO BE WITH YOU AT HOME THE FIRST 24 HOURS AFTER SURGERY. RESPONSIBLE DRIVER'S NAME___________________________                                       PHONE #   _______________________                                  PLEASE READ OVER ANY  FACT SHEETS THAT YOU WERE GIVEN: MRSA INFORMATION

## 2011-12-29 NOTE — Progress Notes (Signed)
Dr. Daphine Deutscher,        Mrs. Stauber' preop EKG done today at [facility] abnormal--but I showed her EKG to Dr. Acey Lav and Dr. Renold Don and they feel that the EKG is ok for pt's surgery.                           Antonietta Breach RN

## 2012-01-02 ENCOUNTER — Encounter (HOSPITAL_COMMUNITY): Payer: Self-pay | Admitting: *Deleted

## 2012-01-02 ENCOUNTER — Encounter (HOSPITAL_COMMUNITY)
Admission: RE | Disposition: A | Discharge status: Home / Self Care | Payer: Self-pay | Source: Ambulatory Visit | Attending: Surgery

## 2012-01-02 ENCOUNTER — Inpatient Hospital Stay (HOSPITAL_COMMUNITY)
Admission: RE | Admit: 2012-01-02 | Discharge: 2012-01-05 | DRG: 328 | Disposition: A | Discharge status: Home / Self Care | Payer: 59 | Source: Ambulatory Visit | Attending: Surgery | Admitting: Surgery

## 2012-01-02 ENCOUNTER — Encounter (HOSPITAL_COMMUNITY): Payer: Self-pay | Admitting: Anesthesiology

## 2012-01-02 ENCOUNTER — Ambulatory Visit (HOSPITAL_COMMUNITY): Payer: 59 | Admitting: Anesthesiology

## 2012-01-02 DIAGNOSIS — E119 Type 2 diabetes mellitus without complications: Secondary | ICD-10-CM | POA: Diagnosis present

## 2012-01-02 DIAGNOSIS — I10 Essential (primary) hypertension: Secondary | ICD-10-CM | POA: Diagnosis present

## 2012-01-02 DIAGNOSIS — K449 Diaphragmatic hernia without obstruction or gangrene: Secondary | ICD-10-CM

## 2012-01-02 DIAGNOSIS — F411 Generalized anxiety disorder: Secondary | ICD-10-CM | POA: Diagnosis present

## 2012-01-02 DIAGNOSIS — K219 Gastro-esophageal reflux disease without esophagitis: Principal | ICD-10-CM | POA: Diagnosis present

## 2012-01-02 DIAGNOSIS — G40909 Epilepsy, unspecified, not intractable, without status epilepticus: Secondary | ICD-10-CM | POA: Diagnosis present

## 2012-01-02 HISTORY — PX: LAPAROSCOPIC NISSEN FUNDOPLICATION: SHX1932

## 2012-01-02 HISTORY — PX: EPIGASTRIC HERNIA REPAIR: SHX404

## 2012-01-02 LAB — CREATININE, SERUM: GFR calc non Af Amer: 90 mL/min — ABNORMAL LOW (ref >90)

## 2012-01-02 LAB — CBC
HCT: 38.4 % (ref 36.0–46.0)
Hemoglobin: 12.4 g/dL (ref 12.0–15.0)
MCH: 26.3 pg (ref 26.0–34.0)
MCHC: 32.3 g/dL (ref 30.0–36.0)
RBC: 4.71 MIL/uL (ref 3.87–5.11)

## 2012-01-02 LAB — GLUCOSE, CAPILLARY
Glucose-Capillary: 107 mg/dL — ABNORMAL HIGH (ref 70–99)
Glucose-Capillary: 149 mg/dL — ABNORMAL HIGH (ref 70–99)
Glucose-Capillary: 164 mg/dL — ABNORMAL HIGH (ref 70–99)

## 2012-01-02 SURGERY — REPAIR, HERNIA, EPIGASTRIC, ADULT
Anesthesia: General | Site: Abdomen | Wound class: Clean

## 2012-01-02 MED ORDER — BUPIVACAINE LIPOSOME 1.3 % IJ SUSP
INTRAMUSCULAR | Status: DC | PRN
  Administered 2012-01-02: 20 mL

## 2012-01-02 MED ORDER — OXYCODONE-ACETAMINOPHEN 5-325 MG/5ML PO SOLN
5.0000 mL | ORAL | Status: DC | PRN
  Administered 2012-01-03 – 2012-01-04 (×3): 10 mL via ORAL
  Administered 2012-01-05: 5 mL via ORAL
  Filled 2012-01-02: qty 5
  Filled 2012-01-02: qty 10
  Filled 2012-01-02: qty 5
  Filled 2012-01-02 (×2): qty 10

## 2012-01-02 MED ORDER — PHENYLEPHRINE HCL 10 MG/ML IJ SOLN
INTRAMUSCULAR | Status: DC | PRN
  Administered 2012-01-02: 20 ug via INTRAVENOUS
  Administered 2012-01-02: 10 ug via INTRAVENOUS

## 2012-01-02 MED ORDER — CIPROFLOXACIN IN D5W 400 MG/200ML IV SOLN
400.0000 mg | INTRAVENOUS | Status: AC
  Administered 2012-01-02: 400 mg via INTRAVENOUS

## 2012-01-02 MED ORDER — BUPIVACAINE LIPOSOME 1.3 % IJ SUSP
20.0000 mL | Freq: Once | INTRAMUSCULAR | Status: DC
  Filled 2012-01-02: qty 20

## 2012-01-02 MED ORDER — ONDANSETRON HCL 4 MG/2ML IJ SOLN
4.0000 mg | INTRAMUSCULAR | Status: DC | PRN
  Administered 2012-01-04: 4 mg via INTRAVENOUS
  Filled 2012-01-02: qty 2

## 2012-01-02 MED ORDER — LACTATED RINGERS IV SOLN
INTRAVENOUS | Status: DC
  Administered 2012-01-02: 1000 mL via INTRAVENOUS
  Administered 2012-01-02: 14:00:00 via INTRAVENOUS

## 2012-01-02 MED ORDER — GLYCOPYRROLATE 0.2 MG/ML IJ SOLN
INTRAMUSCULAR | Status: DC | PRN
  Administered 2012-01-02: 0.4 mg via INTRAVENOUS
  Administered 2012-01-02: 0.2 mg via INTRAVENOUS

## 2012-01-02 MED ORDER — SODIUM CHLORIDE 0.9 % IJ SOLN
INTRAMUSCULAR | Status: DC | PRN
  Administered 2012-01-02: 20 mL

## 2012-01-02 MED ORDER — BIOTENE DRY MOUTH MT LIQD
15.0000 mL | Freq: Two times a day (BID) | OROMUCOSAL | Status: DC
  Administered 2012-01-03 – 2012-01-05 (×5): 15 mL via OROMUCOSAL

## 2012-01-02 MED ORDER — ONDANSETRON HCL 4 MG/2ML IJ SOLN
INTRAMUSCULAR | Status: DC | PRN
  Administered 2012-01-02 (×2): 2 mg via INTRAVENOUS
  Administered 2012-01-02: 4 mg via INTRAVENOUS

## 2012-01-02 MED ORDER — LACTATED RINGERS IV SOLN: INTRAVENOUS | Status: DC | PRN

## 2012-01-02 MED ORDER — SUCCINYLCHOLINE CHLORIDE 20 MG/ML IJ SOLN
INTRAMUSCULAR | Status: DC | PRN
  Administered 2012-01-02: 100 mg via INTRAVENOUS

## 2012-01-02 MED ORDER — HYDROMORPHONE HCL PF 1 MG/ML IJ SOLN
0.2500 mg | INTRAMUSCULAR | Status: DC | PRN
  Administered 2012-01-02 (×3): 0.5 mg via INTRAVENOUS

## 2012-01-02 MED ORDER — MEPERIDINE HCL 50 MG/ML IJ SOLN: 6.2500 mg | INTRAMUSCULAR | Status: DC | PRN

## 2012-01-02 MED ORDER — HEPARIN SODIUM (PORCINE) 5000 UNIT/ML IJ SOLN
5000.0000 [IU] | Freq: Once | INTRAMUSCULAR | Status: AC
  Administered 2012-01-02: 5000 [IU] via SUBCUTANEOUS
  Filled 2012-01-02: qty 1

## 2012-01-02 MED ORDER — CISATRACURIUM BESYLATE (PF) 10 MG/5ML IV SOLN
INTRAVENOUS | Status: DC | PRN
  Administered 2012-01-02: 2 mg via INTRAVENOUS
  Administered 2012-01-02: 6 mg via INTRAVENOUS
  Administered 2012-01-02: 4 mg via INTRAVENOUS
  Administered 2012-01-02: 10 mg via INTRAVENOUS
  Administered 2012-01-02: 6 mg via INTRAVENOUS

## 2012-01-02 MED ORDER — HEPARIN SODIUM (PORCINE) 5000 UNIT/ML IJ SOLN
5000.0000 [IU] | Freq: Three times a day (TID) | INTRAMUSCULAR | Status: DC
  Administered 2012-01-02 – 2012-01-05 (×8): 5000 [IU] via SUBCUTANEOUS
  Filled 2012-01-02 (×11): qty 1

## 2012-01-02 MED ORDER — OXYCODONE HCL 5 MG/5ML PO SOLN
5.0000 mg | Freq: Once | ORAL | Status: DC | PRN
  Filled 2012-01-02: qty 5

## 2012-01-02 MED ORDER — MIDAZOLAM HCL 5 MG/5ML IJ SOLN
INTRAMUSCULAR | Status: DC | PRN
  Administered 2012-01-02 (×2): 1 mg via INTRAVENOUS

## 2012-01-02 MED ORDER — OXYCODONE HCL 5 MG PO TABS: 5.0000 mg | ORAL_TABLET | Freq: Once | ORAL | Status: DC | PRN

## 2012-01-02 MED ORDER — KCL IN DEXTROSE-NACL 20-5-0.45 MEQ/L-%-% IV SOLN
INTRAVENOUS | Status: DC
  Administered 2012-01-02: 18:00:00 via INTRAVENOUS
  Administered 2012-01-03: 100 mL via INTRAVENOUS
  Administered 2012-01-03: 100 mL/h via INTRAVENOUS
  Administered 2012-01-03: 21:00:00 via INTRAVENOUS
  Administered 2012-01-04: 100 mL via INTRAVENOUS
  Administered 2012-01-05: 07:00:00 via INTRAVENOUS
  Filled 2012-01-02 (×6): qty 1000

## 2012-01-02 MED ORDER — CISATRACURIUM BESYLATE (PF) 10 MG/5ML IV SOLN: INTRAVENOUS | Status: DC | PRN

## 2012-01-02 MED ORDER — EPHEDRINE SULFATE 50 MG/ML IJ SOLN
INTRAMUSCULAR | Status: DC | PRN
  Administered 2012-01-02 (×2): 5 mg via INTRAVENOUS
  Administered 2012-01-02: 10 mg via INTRAVENOUS
  Administered 2012-01-02 (×2): 5 mg via INTRAVENOUS

## 2012-01-02 MED ORDER — MORPHINE SULFATE 2 MG/ML IJ SOLN
2.0000 mg | INTRAMUSCULAR | Status: DC | PRN
  Administered 2012-01-02 (×3): 2 mg via INTRAVENOUS
  Administered 2012-01-02 – 2012-01-03 (×3): 4 mg via INTRAVENOUS
  Administered 2012-01-03: 2 mg via INTRAVENOUS
  Administered 2012-01-03 (×5): 4 mg via INTRAVENOUS
  Administered 2012-01-03 (×2): 2 mg via INTRAVENOUS
  Administered 2012-01-04: 6 mg via INTRAVENOUS
  Administered 2012-01-05: 2 mg via INTRAVENOUS
  Filled 2012-01-02: qty 2
  Filled 2012-01-02 (×2): qty 1
  Filled 2012-01-02: qty 2
  Filled 2012-01-02 (×2): qty 1
  Filled 2012-01-02: qty 2
  Filled 2012-01-02: qty 3
  Filled 2012-01-02: qty 2
  Filled 2012-01-02 (×2): qty 1
  Filled 2012-01-02 (×4): qty 2
  Filled 2012-01-02: qty 1

## 2012-01-02 MED ORDER — LIDOCAINE HCL (CARDIAC) 20 MG/ML IV SOLN
INTRAVENOUS | Status: DC | PRN
  Administered 2012-01-02: 75 mg via INTRAVENOUS

## 2012-01-02 MED ORDER — PROMETHAZINE HCL 25 MG/ML IJ SOLN: 6.2500 mg | INTRAMUSCULAR | Status: DC | PRN

## 2012-01-02 MED ORDER — HYDROMORPHONE HCL PF 1 MG/ML IJ SOLN
INTRAMUSCULAR | Status: AC
  Filled 2012-01-02: qty 1

## 2012-01-02 MED ORDER — FENTANYL CITRATE 0.05 MG/ML IJ SOLN
INTRAMUSCULAR | Status: DC | PRN
  Administered 2012-01-02 (×6): 50 ug via INTRAVENOUS

## 2012-01-02 MED ORDER — NEOSTIGMINE METHYLSULFATE 1 MG/ML IJ SOLN
INTRAMUSCULAR | Status: DC | PRN
  Administered 2012-01-02: 4 mg via INTRAVENOUS

## 2012-01-02 MED ORDER — PROPOFOL 10 MG/ML IV EMUL
INTRAVENOUS | Status: DC | PRN
  Administered 2012-01-02: 200 mg via INTRAVENOUS

## 2012-01-02 MED ORDER — ACETAMINOPHEN 10 MG/ML IV SOLN: 1000.0000 mg | Freq: Once | INTRAVENOUS | Status: DC | PRN

## 2012-01-02 SURGICAL SUPPLY — 57 items
APPLIER CLIP ROT 10 11.4 M/L (STAPLE)
BENZOIN TINCTURE PRP APPL 2/3 (GAUZE/BANDAGES/DRESSINGS) ×3 IMPLANT
CABLE HIGH FREQUENCY MONO STRZ (ELECTRODE) ×3 IMPLANT
CANISTER SUCTION 2500CC (MISCELLANEOUS) ×3 IMPLANT
CLAMP ENDO BABCK 10MM (STAPLE) IMPLANT
CLIP APPLIE ROT 10 11.4 M/L (STAPLE) IMPLANT
CLOTH BEACON ORANGE TIMEOUT ST (SAFETY) ×3 IMPLANT
COVER SURGICAL LIGHT HANDLE (MISCELLANEOUS) ×3 IMPLANT
DECANTER SPIKE VIAL GLASS SM (MISCELLANEOUS) ×3 IMPLANT
DERMABOND ADVANCED (GAUZE/BANDAGES/DRESSINGS) ×1
DERMABOND ADVANCED .7 DNX12 (GAUZE/BANDAGES/DRESSINGS) ×2 IMPLANT
DEVICE SUT QUICK LOAD TK 5 (STAPLE) ×9 IMPLANT
DEVICE SUT TI-KNOT TK 5X26 (MISCELLANEOUS) ×3 IMPLANT
DEVICE SUTURE ENDOST 10MM (ENDOMECHANICALS) ×3 IMPLANT
DISSECTOR BLUNT TIP ENDO 5MM (MISCELLANEOUS) ×3 IMPLANT
DRAIN PENROSE 18X1/2 LTX STRL (DRAIN) ×3 IMPLANT
DRAPE LAPAROSCOPIC ABDOMINAL (DRAPES) ×3 IMPLANT
DUPLOJECT EASY PREP 4ML (MISCELLANEOUS) ×3 IMPLANT
ELECT REM PT RETURN 9FT ADLT (ELECTROSURGICAL) ×3
ELECTRODE REM PT RTRN 9FT ADLT (ELECTROSURGICAL) ×2 IMPLANT
FELT TEFLON 4 X1 (Mesh General) ×3 IMPLANT
FILTER SMOKE EVAC LAPAROSHD (FILTER) ×3 IMPLANT
GLOVE BIOGEL M 8.0 STRL (GLOVE) ×3 IMPLANT
GLOVE BIOGEL PI IND STRL 7.0 (GLOVE) ×2 IMPLANT
GLOVE BIOGEL PI INDICATOR 7.0 (GLOVE) ×1
GOWN STRL NON-REIN LRG LVL3 (GOWN DISPOSABLE) ×6 IMPLANT
GOWN STRL REIN XL XLG (GOWN DISPOSABLE) ×9 IMPLANT
GRASPER ENDO BABCOCK 10 (MISCELLANEOUS) ×2 IMPLANT
GRASPER ENDO BABCOCK 10MM (MISCELLANEOUS) ×1
HAND ACTIVATED (MISCELLANEOUS) ×3 IMPLANT
KIT BASIN OR (CUSTOM PROCEDURE TRAY) ×3 IMPLANT
NS IRRIG 1000ML POUR BTL (IV SOLUTION) ×3 IMPLANT
PENCIL BUTTON HOLSTER BLD 10FT (ELECTRODE) ×3 IMPLANT
SCISSORS LAP 5X35 DISP (ENDOMECHANICALS) ×3 IMPLANT
SET IRRIG TUBING LAPAROSCOPIC (IRRIGATION / IRRIGATOR) ×3 IMPLANT
SLEEVE ADV FIXATION 5X100MM (TROCAR) ×6 IMPLANT
SLEEVE Z-THREAD 5X100MM (TROCAR) IMPLANT
SOLUTION ANTI FOG 6CC (MISCELLANEOUS) ×3 IMPLANT
STAPLER VISISTAT 35W (STAPLE) ×3 IMPLANT
STRIP CLOSURE SKIN 1/2X4 (GAUZE/BANDAGES/DRESSINGS) IMPLANT
SUT SURGIDAC NAB ES-9 0 48 120 (SUTURE) ×9 IMPLANT
SUT VIC AB 4-0 SH 18 (SUTURE) ×3 IMPLANT
SYR 30ML LL (SYRINGE) ×3 IMPLANT
TIP INNERVISION DETACH 40FR (MISCELLANEOUS) IMPLANT
TIP INNERVISION DETACH 50FR (MISCELLANEOUS) IMPLANT
TIP INNERVISION DETACH 56FR (MISCELLANEOUS) ×3 IMPLANT
TIPS INNERVISION DETACH 40FR (MISCELLANEOUS)
TRAY FOLEY CATH 14FRSI W/METER (CATHETERS) ×3 IMPLANT
TRAY LAP CHOLE (CUSTOM PROCEDURE TRAY) ×3 IMPLANT
TROCAR ADV FIXATION 11X100MM (TROCAR) IMPLANT
TROCAR ADV FIXATION 5X100MM (TROCAR) ×3 IMPLANT
TROCAR XCEL BLUNT TIP 100MML (ENDOMECHANICALS) IMPLANT
TROCAR XCEL NON-BLD 11X100MML (ENDOMECHANICALS) IMPLANT
TROCAR Z-THREAD FIOS 11X100 BL (TROCAR) ×3 IMPLANT
TROCAR Z-THREAD FIOS 5X100MM (TROCAR) ×3 IMPLANT
TROCAR Z-THREAD SLEEVE 11X100 (TROCAR) ×3 IMPLANT
TUBING FILTER THERMOFLATOR (ELECTROSURGICAL) ×3 IMPLANT

## 2012-01-02 NOTE — Op Note (Signed)
Surgeon: Wenda Low, MD, FACS  Asst:  Ovidio Kin, MD, FACS  Anes:  General  Procedure: Laparoscopic takedown of large type III mixed hiatus hernia with primary closure of the diaphragm with 3 sutures posteriorally (post 2 with pledgets) and Nissen fundoplication over a #56 lighted bougie  Diagnosis: Large type III mixed hiatus hernia  Complications: none  EBL:   20 cc  Description of Procedure:  The patient was taken to or 1 and given general anesthesia. The abdomen was prepped with chloroxylenol and draped sterilely. Access to the abdomen was achieved to the left upper quadrant with a 0 5 mm Optiview technique. A second 5 was placed to the left of the umbilicus and a 11 placed on the right of the umbilicus and another 5 in the right upper quadrant. The Nathanson retractor was placed through a 5 mm opening in the upper midline and the left lateral segment was retracted to expose the large hiatal hernia containing much of the proximal stomach. The procedure began by opening the gastrohepatic window in and then incising along the right crus and carried this anteriorly. Patient seemed to have a pretty substantial circumferential hernia around the esophagus. We took down the short gastrics and went up in on the left side taking the sac off the left crus and then eventually: This all down in the abdomen. Penrose drain was passed behind and with this downward traction was able to dissect posterior portion of the crura and see that nicely. Also was able to get the herniated fat posteriorly off the esophagus and bring that into the abdomen as well.  The crura and opposed closed posteriorly with 3 sutures the posterior to had pledgets on. The suture immediately adjacent to the esophagus was without a pledget.    Next was able to go around behind the esophagus and grasped cord portion of the stomach on the left side and pulled around for the ultimate invagination creation of the wrap. This was able to  stay in place without retracting back and a we passed a 56 lighted bougie into the stomach without difficulty. This was passed by the anesthesiologist and once in place I used to calibrate the repair. Prior to doing this however I did spend some time using harmonic scalpel debulking the proximal esophagogastric junction of the very fatty hernia sac.  3 sutures were placed with the Endo Stitch secured with Ty knots. A similar set up was used in closing the diaphragm as dictated above. A 56 bougie was then removed. The wrap. Pink and healthy and in place with 3 sutures. Tisseel was applied over the wrap and ports were injected with Exparel and closed with 4-0 Vicryl subcutaneously and Dermabond.    [name], MD, [facility], [place] [phone]

## 2012-01-02 NOTE — Transfer of Care (Signed)
Immediate Anesthesia Transfer of Care Note  Patient: Valerie Ball  Procedure(s) Performed: Procedure(s) (LRB) with comments: HERNIA REPAIR EPIGASTRIC ADULT (N/A) - Laparoscopic Repair Paraesophageal Hernia, Nissen LAPAROSCOPIC NISSEN FUNDOPLICATION (N/A)  Patient Location: PACU  Anesthesia Type: General  Level of Consciousness: awake, patient cooperative, confused, lethargic and responds to stimulation  Airway & Oxygen Therapy: Patient Spontanous Breathing and Patient connected to face mask oxygen  Post-op Assessment: Report given to PACU RN, Post -op Vital signs reviewed and stable and Patient moving all extremities  Post vital signs: Reviewed and stable  Complications: No apparent anesthesia complications

## 2012-01-02 NOTE — Anesthesia Postprocedure Evaluation (Signed)
Anesthesia Post Note  Patient: Valerie Ball  Procedure(s) Performed: Procedure(s) (LRB): HERNIA REPAIR EPIGASTRIC ADULT (N/A) LAPAROSCOPIC NISSEN FUNDOPLICATION (N/A)  Anesthesia type: General  Patient location: PACU  Post pain: Pain level controlled  Post assessment: Post-op Vital signs reviewed  Last Vitals: BP 158/90  Pulse 110  Temp 36.7 C  Resp 13  SpO2 98%  LMP 03/20/1981  Post vital signs: Reviewed  Level of consciousness: sedated  Complications: No apparent anesthesia complications

## 2012-01-02 NOTE — H&P (View-Only) (Signed)
Chief Complaint:  Large type iii mixed hiatus hernia with anemia  History of Present Illness:  Valerie Ball is an 66 y.o. female from [place] comes for evaluation.  She indicates that she has had reflux for probably 30 years. Recently she has been getting more problems with anemia and on endoscopy by Dr. Patterson was found to have significant hiatal hernia and a lot of her stomach in her chest.  I discussed this with her and her husband in detail including diagramming how we would do a hiatal hernia repair and Nissen fundoplication. I gave him a booklet and discussed the complications unlimited wrap failure as well as other complications that can cause us to have to do this procedure open. They want to go ahead and get this scheduled.  To go on prepaid vacation so he'll probably be some time at least in October for we can do her surgery.  Past Medical History  Diagnosis Date  . Allergy     allegic rhinitis  . Anxiety   . GERD (gastroesophageal reflux disease)   . Hypertension   . Hyperlipidemia   . Seizure disorder   . RLS (restless legs syndrome)   . Arthritis   . Diverticulosis   . GI bleed   . Cataract   . Breast cancer 02/2009    breast CA L invasive ductal CA(hormone receptor positive.)  . Salmonella 05/2000    renal effects secondary to dehydration  . Hiatal hernia   . Melena 01/2008    anemia transfusion  . Esophageal stricture 02/04/2008  . Borderline diabetic     per patient medical history form  . Menopause     per patient medical history form  . Postmenopausal hormone therapy     per patient medical history form.  . Blood transfusion   . Diabetes mellitus   . Osteoporosis   . Colon stricture 01/14/2003    Past Surgical History  Procedure Date  . Abdominal hysterectomy 1983  . Ovary surgery 1986    ovarian tumor removed  . Breast surgery 02/2009    breast biopsy invasive ductal CA  . Mastectomy 04/2009    Bilateral for ductal carcinoma on L     Current Outpatient Prescriptions  Medication Sig Dispense Refill  . alendronate (FOSAMAX) 70 MG tablet Take 70 mg by mouth every 7 (seven) days. Take with a full glass of water on an empty stomach. Take on Sat.      . Cholecalciferol (VITAMIN D-3 PO) Take 1 tablet by mouth daily.        . dexlansoprazole (DEXILANT) 60 MG capsule Take 60 mg by mouth daily.      . enalapril (VASOTEC) 20 MG tablet TAKE 1 TABLET DAILY  90 tablet  3  . ferrous sulfate 325 (65 FE) MG tablet Take 325 mg by mouth 2 (two) times daily.      . fluticasone (FLONASE) 50 MCG/ACT nasal spray 2 sprays by Nasal route daily as needed.        . rOPINIRole (REQUIP) 1 MG tablet TAKE 1 TABLET AT BEDTIME  90 tablet  3  . sertraline (ZOLOFT) 100 MG tablet TAKE 1 TABLET DAILY  90 tablet  3  . simvastatin (ZOCOR) 10 MG tablet Take 0.5 tablets (5 mg total) by mouth daily.  45 tablet  3  . ZETIA 10 MG tablet TAKE 1 TABLET DAILY  90 tablet  3  . DISCONTD: dexlansoprazole (DEXILANT) 60 MG capsule Take 1 capsule (60 mg   total) by mouth daily.  30 capsule  11  . DISCONTD: cetirizine (ZYRTEC) 10 MG tablet Take 10 mg by mouth daily as needed. For allergy symptoms      . DISCONTD: hydrochlorothiazide 25 MG tablet Take 25 mg by mouth daily.         Arimidex; Metformin and related; and Penicillins Family History  Problem Relation Age of Onset  . Heart attack Father   . Esophageal cancer Father   . Diabetes Father   . Breast cancer Sister   . Diabetes Sister   . Hypertension Brother   . Diabetes Brother   . Breast cancer Sister   . Hypertension Mother   . Stroke Mother   . Breast cancer Mother   . Hypertension Sister    Social History:   reports that she has never smoked. She has never used smokeless tobacco. She reports that she does not drink alcohol or use illicit drugs.   REVIEW OF SYSTEMS - PERTINENT POSITIVES ONLY: Allergy to penicillin  Physical Exam:   Blood pressure 162/96, pulse 96, resp. rate 20, height 5' 4.5"  (1.638 m), weight 161 lb (73.029 kg), last menstrual period 03/20/1981. Body mass index is 27.21 kg/(m^2).  Gen:  WDWN WF NAD  Neurological: Alert and oriented to person, place, and time. Motor and sensory function is grossly intact  Head: Normocephalic and atraumatic.  Eyes: Conjunctivae are normal. Pupils are equal, round, and reactive to light. No scleral icterus.  Neck: Normal range of motion. Neck supple. No tracheal deviation or thyromegaly present.   Breast-post bilateral mastectomies Cardiovascular:  SR without murmurs or gallops.  No carotid bruits Respiratory: Effort normal.  No respiratory distress. No chest wall tenderness. Breath sounds normal.  No wheezes, rales or rhonchi.  Abdomen:  nontender GU: Musculoskeletal: Normal range of motion. Extremities are nontender. No cyanosis, edema or clubbing noted Lymphadenopathy: No cervical, preauricular, postauricular or axillary adenopathy is present Skin: Skin is warm and dry. No rash noted. No diaphoresis. No erythema. No pallor. Pscyh: Normal mood and affect. Behavior is normal. Judgment and thought content normal.   LABORATORY RESULTS: No results found for this or any previous visit (from the past 48 hour(s)).  RADIOLOGY RESULTS: No results found.  Problem List: Patient Active Problem List  Diagnosis  . BREAST CANCER  . Hyperglycemia  . HYPERLIPIDEMIA  . UNSPECIFIED ANEMIA  . ANXIETY  . DISORDERS, ORGANIC SLEEP NEC  . SYNDROME, RESTLESS LEGS  . SYNDROME, CARPAL TUNNEL  . HYPERTENSION  . ALLERGIC RHINITIS  . ESOPHAGEAL STRICTURE  . GERD  . HIATAL HERNIA  . DIVERTICULOSIS-COLON  . PERITONEAL ADHESIONS  . FATTY LIVER DISEASE  . SEIZURE DISORDER  . TRANSAMINASES, SERUM, ELEVATED  . ADVERSE DRUG REACTION  . Routine general medical examination at a health care facility  . Abdominal pain, acute, epigastric  . Stress reaction  . Large type III mixed hiatus hernia     Assessment & Plan: Large type III mixed hiatus  hernia. Laparoscopic repair and Nissen fundoplication    Matt B. Jaymian Bogart, MD, FACS  [facility] [phone] beeper [phone]  12/06/2011 4:10 PM     

## 2012-01-02 NOTE — Interval H&P Note (Signed)
History and Physical Interval Note:  01/02/2012 12:03 PM  Valerie Ball  has presented today for surgery, with the diagnosis of paraesophageal hernia  The various methods of treatment have been discussed with the patient and family. After consideration of risks, benefits and other options for treatment, the patient has consented to  Procedure(s) (LRB) with comments: HERNIA REPAIR EPIGASTRIC ADULT (N/A) - Laparoscopic Repair Paraesophageal Hernia, Nissen LAPAROSCOPIC NISSEN FUNDOPLICATION (N/A) as a surgical intervention .  The patient's history has been reviewed, patient examined, no change in status, stable for surgery.  I have reviewed the patient's chart and labs.  Questions were answered to the patient's satisfaction.     Lorain Fettes B

## 2012-01-02 NOTE — Preoperative (Addendum)
Beta Blockers   Reason not to administer Beta Blockers:Not Applicable 

## 2012-01-02 NOTE — Anesthesia Preprocedure Evaluation (Addendum)
Anesthesia Evaluation  Patient identified by MRN, date of birth, ID band Patient awake    Reviewed: Allergy & Precautions, H&P , NPO status , Patient's Chart, lab work & pertinent test results  Airway Mallampati: II TM Distance: >3 FB Neck ROM: Full    Dental  (+) Teeth Intact and Dental Advisory Given   Pulmonary  breath sounds clear to auscultation  Pulmonary exam normal       Cardiovascular hypertension, Pt. on medications Rhythm:Regular Rate:Normal     Neuro/Psych Seizures -,  PSYCHIATRIC DISORDERS Anxiety Seizure x 1. No therapy.  Neuromuscular disease    GI/Hepatic Neg liver ROS, hiatal hernia, GERD-  Medicated,  Endo/Other  diabetes, Type 2  Renal/GU negative Renal ROS     Musculoskeletal   Abdominal   Peds  Hematology negative hematology ROS (+)   Anesthesia Other Findings   Reproductive/Obstetrics                           Anesthesia Physical Anesthesia Plan  ASA: II  Anesthesia Plan: General   Post-op Pain Management:    Induction: Intravenous, Rapid sequence and Cricoid pressure planned  Airway Management Planned: Oral ETT  Additional Equipment:   Intra-op Plan:   Post-operative Plan: Extubation in OR  Informed Consent: I have reviewed the patients History and Physical, chart, labs and discussed the procedure including the risks, benefits and alternatives for the proposed anesthesia with the patient or authorized representative who has indicated his/her understanding and acceptance.   Dental advisory given  Plan Discussed with: CRNA  Anesthesia Plan Comments:         Anesthesia Quick Evaluation

## 2012-01-02 NOTE — Anesthesia Procedure Notes (Signed)
Procedure Name: Intubation Date/Time: 01/02/2012 1:00 PM Performed by: Edison Pace Pre-anesthesia Checklist: Patient identified, Timeout performed, Emergency Drugs available, Suction available and Patient being monitored Patient Re-evaluated:Patient Re-evaluated prior to inductionOxygen Delivery Method: Circle system utilized Preoxygenation: Pre-oxygenation with 100% oxygen Intubation Type: Cricoid Pressure applied, Rapid sequence and IV induction Laryngoscope Size: Mac and 4 Grade View: Grade I Tube type: Oral Tube size: 7.5 mm Number of attempts: 1 Airway Equipment and Method: Stylet Placement Confirmation: ETT inserted through vocal cords under direct vision,  positive ETCO2 and CO2 detector Secured at: 21 cm Tube secured with: Tape Dental Injury: Teeth and Oropharynx as per pre-operative assessment

## 2012-01-03 ENCOUNTER — Inpatient Hospital Stay (HOSPITAL_COMMUNITY): Payer: 59

## 2012-01-03 ENCOUNTER — Encounter (HOSPITAL_COMMUNITY): Payer: Self-pay | Admitting: Surgery

## 2012-01-03 LAB — GLUCOSE, CAPILLARY
Glucose-Capillary: 164 mg/dL — ABNORMAL HIGH (ref 70–99)
Glucose-Capillary: 165 mg/dL — ABNORMAL HIGH (ref 70–99)

## 2012-01-03 LAB — CBC WITH DIFFERENTIAL/PLATELET
Basophils Relative: 0 % (ref 0–1)
Eosinophils Absolute: 0 10*3/uL (ref 0.0–0.7)
Hemoglobin: 12.3 g/dL (ref 12.0–15.0)
MCHC: 32.3 g/dL (ref 30.0–36.0)
Monocytes Relative: 7 % (ref 3–12)
Neutro Abs: 5.9 10*3/uL (ref 1.7–7.7)
Neutrophils Relative %: 84 % — ABNORMAL HIGH (ref 43–77)
Platelets: 125 10*3/uL — ABNORMAL LOW (ref 150–400)
RBC: 4.66 MIL/uL (ref 3.87–5.11)

## 2012-01-03 MED ORDER — IOHEXOL 300 MG/ML  SOLN
50.0000 mL | Freq: Once | INTRAMUSCULAR | Status: AC | PRN
  Administered 2012-01-03: 50 mL via ORAL

## 2012-01-03 NOTE — Progress Notes (Signed)
CARE MANAGEMENT NOTE 01/03/2012  Patient:  ARMONEE, BOJANOWSKI   Account Number:  [id number]  Date Initiated:  01/03/2012  Documentation initiated by:  DAVIS,RHONDA  Subjective/Objective Assessment:   post op step down due to surg procedure and wound care     Action/Plan:   home   Anticipated DC Date:  01/04/2012   Anticipated DC Plan:  HOME/SELF CARE  In-house referral  NA      DC Planning Services  NA      [facility] Choice  NA   Choice offered to / List presented to:  NA   DME arranged  NA      DME agency  NA     HH arranged  NA      HH agency  NA   Status of service:  In process, will continue to follow Medicare Important Message given?  NA - LOS <3 / Initial given by admissions (If response is "NO", the following Medicare IM given date fields will be blank) Date Medicare IM given:   Date Additional Medicare IM given:    Discharge Disposition:    Per UR Regulation:  Reviewed for med. necessity/level of care/duration of stay  If discussed at Long Length of Stay Meetings, dates discussed:    Comments:  [id number] Earlene Plater, RN, BSN, CCM: CHART REVIEWED AND UPDATED. NO DISCHARGE NEEDS PRESENT AT THIS TIME. CASE MANAGEMENT [phone]

## 2012-01-03 NOTE — Progress Notes (Addendum)
Patient ID: Valerie Ball, female   DOB: 1945/09/11, 66 y.o.   MRN: [id number] [facility] Progress Note:   1 Day Post-Op  Subjective: Mental status is clear.  Having back pain from hiatus closure Objective: Vital signs in last 24 hours: Temp:  [98.1 F (36.7 C)-99.8 F (37.7 C)] 99.8 F (37.7 C) (10/16 0400) Pulse Rate:  [87-111] 108  (10/16 0327) Resp:  [13-21] 18  (10/16 0327) BP: (129-186)/(65-102) 144/71 mmHg (10/16 0327) SpO2:  [94 %-100 %] 94 % (10/16 0327) Weight:  [166 lb 14.2 oz (75.7 kg)-167 lb 15.9 oz (76.2 kg)] 167 lb 15.9 oz (76.2 kg) (10/16 0500)  Intake/Output from previous day: 10/15 0701 - 10/16 0700 In: 3950 [I.V.:3950] Out: 2425 [Urine:2400; Blood:25] Intake/Output this shift:    Physical Exam: Work of breathing is OK.  Abdomen is flat. Incisions are sore.  Lab Results:  Results for orders placed during the hospital encounter of 01/02/12 (from the past 48 hour(s))  GLUCOSE, CAPILLARY     Status: Abnormal   Collection Time   01/02/12 10:29 AM      Component Value Range Comment   Glucose-Capillary 107 (*) 70 - 99 mg/dL   CBC     Status: Abnormal   Collection Time   01/02/12  5:27 PM      Component Value Range Comment   WBC 9.7  4.0 - 10.5 K/uL    RBC 4.71  3.87 - 5.11 MIL/uL    Hemoglobin 12.4  12.0 - 15.0 g/dL    HCT 40.9  81.1 - 91.4 %    MCV 81.5  78.0 - 100.0 fL    MCH 26.3  26.0 - 34.0 pg    MCHC 32.3  30.0 - 36.0 g/dL    RDW 78.2 (*) 95.6 - 15.5 %    Platelets 124 (*) 150 - 400 K/uL   CREATININE, SERUM     Status: Abnormal   Collection Time   01/02/12  5:27 PM      Component Value Range Comment   Creatinine, Ser 0.70  0.50 - 1.10 mg/dL    GFR calc non Af Amer 90 (*) >90 mL/min    GFR calc Af Amer >90  >90 mL/min   GLUCOSE, CAPILLARY     Status: Abnormal   Collection Time   01/02/12  6:10 PM      Component Value Range Comment   Glucose-Capillary 149 (*) 70 - 99 mg/dL    Comment 1 Documented in Chart      Comment 2 Notify RN      GLUCOSE, CAPILLARY     Status: Abnormal   Collection Time   01/02/12  7:38 PM      Component Value Range Comment   Glucose-Capillary 164 (*) 70 - 99 mg/dL   GLUCOSE, CAPILLARY     Status: Abnormal   Collection Time   01/02/12 11:41 PM      Component Value Range Comment   Glucose-Capillary 145 (*) 70 - 99 mg/dL    Comment 1 Notify RN     CBC WITH DIFFERENTIAL     Status: Abnormal   Collection Time   01/03/12 12:00 AM      Component Value Range Comment   WBC 7.1  4.0 - 10.5 K/uL    RBC 4.66  3.87 - 5.11 MIL/uL    Hemoglobin 12.3  12.0 - 15.0 g/dL    HCT 21.3  08.6 - 57.8 %    MCV 81.8  78.0 -  100.0 fL    MCH 26.4  26.0 - 34.0 pg    MCHC 32.3  30.0 - 36.0 g/dL    RDW 16.1 (*) 09.6 - 15.5 %    Platelets 125 (*) 150 - 400 K/uL    Neutrophils Relative 84 (*) 43 - 77 %    Neutro Abs 5.9  1.7 - 7.7 K/uL    Lymphocytes Relative 9 (*) 12 - 46 %    Lymphs Abs 0.6 (*) 0.7 - 4.0 K/uL    Monocytes Relative 7  3 - 12 %    Monocytes Absolute 0.5  0.1 - 1.0 K/uL    Eosinophils Relative 0  0 - 5 %    Eosinophils Absolute 0.0  0.0 - 0.7 K/uL    Basophils Relative 0  0 - 1 %    Basophils Absolute 0.0  0.0 - 0.1 K/uL   GLUCOSE, CAPILLARY     Status: Abnormal   Collection Time   01/03/12  3:20 AM      Component Value Range Comment   Glucose-Capillary 168 (*) 70 - 99 mg/dL    Comment 1 Notify RN     GLUCOSE, CAPILLARY     Status: Abnormal   Collection Time   01/03/12  7:45 AM      Component Value Range Comment   Glucose-Capillary 164 (*) 70 - 99 mg/dL    Comment 1 Documented in Chart      Comment 2 Notify RN       Radiology/Results: No results found.  Anti-infectives: Anti-infectives     Start     Dose/Rate Route Frequency Ordered Stop   01/02/12 0956   ciprofloxacin (CIPRO) IVPB 400 mg        400 mg 200 mL/hr over 60 Minutes Intravenous 120 min pre-op 01/02/12 0454 01/02/12 1245          Assessment/Plan: Problem List: Patient Active Problem List  Diagnosis  . BREAST  CANCER  . Hyperglycemia  . HYPERLIPIDEMIA  . UNSPECIFIED ANEMIA  . ANXIETY  . DISORDERS, ORGANIC SLEEP NEC  . SYNDROME, RESTLESS LEGS  . SYNDROME, CARPAL TUNNEL  . HYPERTENSION  . ALLERGIC RHINITIS  . ESOPHAGEAL STRICTURE  . GERD  . HIATAL HERNIA  . DIVERTICULOSIS-COLON  . PERITONEAL ADHESIONS  . FATTY LIVER DISEASE  . SEIZURE DISORDER  . TRANSAMINASES, SERUM, ELEVATED  . ADVERSE DRUG REACTION  . Routine general medical examination at a health care facility  . Abdominal pain, acute, epigastric  . Stress reaction  . Large type III mixed hiatus hernia     Await UGI swallow this morning  1 Day Post-Op    LOS: 1 day   [name], MD, [facility] [phone] beeper [phone]  01/03/2012 8:17 AM  UGI looks OK.  Will begin clear liquids.  Keep in stepdown

## 2012-01-03 NOTE — Progress Notes (Signed)
Pt ambulates well but is slightly unsteady on feet.  On room air , her O2SATS drop into mid 80's.  Pt instructed in importance of cough; deep breathing and incentive spirometry. Valerie Ball

## 2012-01-04 ENCOUNTER — Encounter (HOSPITAL_COMMUNITY): Payer: Self-pay

## 2012-01-04 LAB — CBC WITH DIFFERENTIAL/PLATELET
Basophils Absolute: 0 10*3/uL (ref 0.0–0.1)
Eosinophils Relative: 0 % (ref 0–5)
Lymphocytes Relative: 13 % (ref 12–46)
MCV: 82.5 fL (ref 78.0–100.0)
Monocytes Relative: 10 % (ref 3–12)
Neutrophils Relative %: 77 % (ref 43–77)
Platelets: DECREASED 10*3/uL (ref 150–400)
RBC: 4.35 MIL/uL (ref 3.87–5.11)
RDW: 17.7 % — ABNORMAL HIGH (ref 11.5–15.5)
WBC: 6 10*3/uL (ref 4.0–10.5)

## 2012-01-04 LAB — GLUCOSE, CAPILLARY
Glucose-Capillary: 111 mg/dL — ABNORMAL HIGH (ref 70–99)
Glucose-Capillary: 134 mg/dL — ABNORMAL HIGH (ref 70–99)
Glucose-Capillary: 148 mg/dL — ABNORMAL HIGH (ref 70–99)
Glucose-Capillary: 164 mg/dL — ABNORMAL HIGH (ref 70–99)

## 2012-01-04 MED ORDER — SERTRALINE HCL 100 MG PO TABS
100.0000 mg | ORAL_TABLET | Freq: Every day | ORAL | Status: DC
  Administered 2012-01-04: 100 mg via ORAL
  Filled 2012-01-04 (×2): qty 1

## 2012-01-04 NOTE — Progress Notes (Signed)
Patient ID: Valerie Ball, female   DOB: Mar 22, 1945, 66 y.o.   MRN: [id number] [facility] Surgery Progress Note:   2 Days Post-Op  Subjective: Mental status is clear.  Has been up walking around unit;  Oxygen drops when on room air Objective: Vital signs in last 24 hours: Temp:  [98.1 F (36.7 C)-99.1 F (37.3 C)] 98.1 F (36.7 C) (10/17 0400) Pulse Rate:  [90-108] 100  (10/17 0400) Resp:  [14-25] 21  (10/17 0400) BP: (117-160)/(53-75) 160/75 mmHg (10/17 0400) SpO2:  [85 %-95 %] 93 % (10/17 0400) Weight:  [165 lb 2 oz (74.9 kg)] 165 lb 2 oz (74.9 kg) (10/17 0000)  Intake/Output from previous day: 10/16 0701 - 10/17 0700 In: 2260 [P.O.:260; I.V.:2000] Out: 3750 [Urine:3750] Intake/Output this shift:    Physical Exam: Work of breathing is stable and not appeared increased.  Minimal abdominal pain  Lab Results:  Results for orders placed during the hospital encounter of 01/02/12 (from the past 48 hour(s))  GLUCOSE, CAPILLARY     Status: Abnormal   Collection Time   01/02/12 10:29 AM      Component Value Range Comment   Glucose-Capillary 107 (*) 70 - 99 mg/dL   CBC     Status: Abnormal   Collection Time   01/02/12  5:27 PM      Component Value Range Comment   WBC 9.7  4.0 - 10.5 K/uL    RBC 4.71  3.87 - 5.11 MIL/uL    Hemoglobin 12.4  12.0 - 15.0 g/dL    HCT 40.9  81.1 - 91.4 %    MCV 81.5  78.0 - 100.0 fL    MCH 26.3  26.0 - 34.0 pg    MCHC 32.3  30.0 - 36.0 g/dL    RDW 78.2 (*) 95.6 - 15.5 %    Platelets 124 (*) 150 - 400 K/uL   CREATININE, SERUM     Status: Abnormal   Collection Time   01/02/12  5:27 PM      Component Value Range Comment   Creatinine, Ser 0.70  0.50 - 1.10 mg/dL    GFR calc non Af Amer 90 (*) >90 mL/min    GFR calc Af Amer >90  >90 mL/min   GLUCOSE, CAPILLARY     Status: Abnormal   Collection Time   01/02/12  6:10 PM      Component Value Range Comment   Glucose-Capillary 149 (*) 70 - 99 mg/dL    Comment 1 Documented in Chart      Comment  2 Notify RN     GLUCOSE, CAPILLARY     Status: Abnormal   Collection Time   01/02/12  7:38 PM      Component Value Range Comment   Glucose-Capillary 164 (*) 70 - 99 mg/dL   GLUCOSE, CAPILLARY     Status: Abnormal   Collection Time   01/02/12 11:41 PM      Component Value Range Comment   Glucose-Capillary 145 (*) 70 - 99 mg/dL    Comment 1 Notify RN     CBC WITH DIFFERENTIAL     Status: Abnormal   Collection Time   01/03/12 12:00 AM      Component Value Range Comment   WBC 7.1  4.0 - 10.5 K/uL    RBC 4.66  3.87 - 5.11 MIL/uL    Hemoglobin 12.3  12.0 - 15.0 g/dL    HCT 21.3  08.6 - 57.8 %    MCV  81.8  78.0 - 100.0 fL    MCH 26.4  26.0 - 34.0 pg    MCHC 32.3  30.0 - 36.0 g/dL    RDW 86.5 (*) 78.4 - 15.5 %    Platelets 125 (*) 150 - 400 K/uL    Neutrophils Relative 84 (*) 43 - 77 %    Neutro Abs 5.9  1.7 - 7.7 K/uL    Lymphocytes Relative 9 (*) 12 - 46 %    Lymphs Abs 0.6 (*) 0.7 - 4.0 K/uL    Monocytes Relative 7  3 - 12 %    Monocytes Absolute 0.5  0.1 - 1.0 K/uL    Eosinophils Relative 0  0 - 5 %    Eosinophils Absolute 0.0  0.0 - 0.7 K/uL    Basophils Relative 0  0 - 1 %    Basophils Absolute 0.0  0.0 - 0.1 K/uL   GLUCOSE, CAPILLARY     Status: Abnormal   Collection Time   01/03/12  3:20 AM      Component Value Range Comment   Glucose-Capillary 168 (*) 70 - 99 mg/dL    Comment 1 Notify RN     GLUCOSE, CAPILLARY     Status: Abnormal   Collection Time   01/03/12  7:45 AM      Component Value Range Comment   Glucose-Capillary 164 (*) 70 - 99 mg/dL    Comment 1 Documented in Chart      Comment 2 Notify RN     GLUCOSE, CAPILLARY     Status: Abnormal   Collection Time   01/03/12 12:03 PM      Component Value Range Comment   Glucose-Capillary 165 (*) 70 - 99 mg/dL    Comment 1 Documented in Chart      Comment 2 Notify RN     GLUCOSE, CAPILLARY     Status: Abnormal   Collection Time   01/03/12  4:40 PM      Component Value Range Comment   Glucose-Capillary 193 (*)  70 - 99 mg/dL   GLUCOSE, CAPILLARY     Status: Abnormal   Collection Time   01/03/12  7:37 PM      Component Value Range Comment   Glucose-Capillary 155 (*) 70 - 99 mg/dL    Comment 1 Documented in Chart      Comment 2 Notify RN     CBC WITH DIFFERENTIAL     Status: Abnormal   Collection Time   01/04/12 12:05 AM      Component Value Range Comment   WBC 6.0  4.0 - 10.5 K/uL    RBC 4.35  3.87 - 5.11 MIL/uL    Hemoglobin 11.6 (*) 12.0 - 15.0 g/dL    HCT 69.6 (*) 29.5 - 46.0 %    MCV 82.5  78.0 - 100.0 fL    MCH 26.7  26.0 - 34.0 pg    MCHC 32.3  30.0 - 36.0 g/dL    RDW 28.4 (*) 13.2 - 15.5 %    Platelets    150 - 400 K/uL    Value: PLATELET CLUMPS NOTED ON SMEAR, COUNT APPEARS DECREASED   Neutrophils Relative 77  43 - 77 %    Lymphocytes Relative 13  12 - 46 %    Monocytes Relative 10  3 - 12 %    Eosinophils Relative 0  0 - 5 %    Basophils Relative 0  0 - 1 %  Neutro Abs 4.6  1.7 - 7.7 K/uL    Lymphs Abs 0.8  0.7 - 4.0 K/uL    Monocytes Absolute 0.6  0.1 - 1.0 K/uL    Eosinophils Absolute 0.0  0.0 - 0.7 K/uL    Basophils Absolute 0.0  0.0 - 0.1 K/uL    Smear Review PLATELET COUNT CONFIRMED BY SMEAR     GLUCOSE, CAPILLARY     Status: Abnormal   Collection Time   01/04/12 12:14 AM      Component Value Range Comment   Glucose-Capillary 148 (*) 70 - 99 mg/dL   GLUCOSE, CAPILLARY     Status: Abnormal   Collection Time   01/04/12  3:16 AM      Component Value Range Comment   Glucose-Capillary 111 (*) 70 - 99 mg/dL    Comment 1 Documented in Chart      Comment 2 Notify RN     GLUCOSE, CAPILLARY     Status: Abnormal   Collection Time   01/04/12  7:32 AM      Component Value Range Comment   Glucose-Capillary 164 (*) 70 - 99 mg/dL    Comment 1 Documented in Chart      Comment 2 Notify RN       Radiology/Results: Dg Ugi W/water Sol Cm  01/03/2012  *RADIOLOGY REPORT*  Clinical Data:  Postoperative from laparoscopic Nissen fundoplication and hiatal hernia repair.  UPPER  GI SERIES WITH KUB  Technique:  Routine upper GI series was performed with water soluble contrast (50 ml Omnipaque 300).  Fluoroscopy Time: 1 minute  Comparison:  11/13/2011  Findings: Pre-procedure KUB demonstrates gaseous distension of the stomach.  Bowel gas pattern nonobstructive.  No acute osseous finding.  Hemidiaphragms are excluded from the image.  Following administration of water soluble contrast, there is mild proximal esophageal distension to the level of the fundoplication. Mild delayed passage is not unexpected in the recently postoperative state.  Tertiary contractions observed. No extravasation of ingested contrast to suggest a leak.  IMPRESSION: Postoperative findings of Nissen fundoplication.  No extravasation to suggest leak.  Tertiary contractions observed.   Original Report Authenticated By: Waneta Martins, M.D.     Anti-infectives: Anti-infectives     Start     Dose/Rate Route Frequency Ordered Stop   01/02/12 0956   ciprofloxacin (CIPRO) IVPB 400 mg        400 mg 200 mL/hr over 60 Minutes Intravenous 120 min pre-op 01/02/12 1610 01/02/12 1245          Assessment/Plan: Problem List: Patient Active Problem List  Diagnosis  . BREAST CANCER  . Hyperglycemia  . HYPERLIPIDEMIA  . UNSPECIFIED ANEMIA  . ANXIETY  . DISORDERS, ORGANIC SLEEP NEC  . SYNDROME, RESTLESS LEGS  . SYNDROME, CARPAL TUNNEL  . HYPERTENSION  . ALLERGIC RHINITIS  . ESOPHAGEAL STRICTURE  . GERD  . HIATAL HERNIA  . DIVERTICULOSIS-COLON  . PERITONEAL ADHESIONS  . FATTY LIVER DISEASE  . SEIZURE DISORDER  . TRANSAMINASES, SERUM, ELEVATED  . ADVERSE DRUG REACTION  . Routine general medical examination at a health care facility  . Abdominal pain, acute, epigastric  . Stress reaction  . Large type III mixed hiatus hernia     Swallow looked OK.  Tolerating clears.  Transfer to floor and continue to monitor O2 saturation.  2 Days Post-Op    LOS: 2 days   [name], MD, [facility] [phone] beeper [phone]  01/04/2012 8:36 AM

## 2012-01-04 NOTE — Progress Notes (Signed)
CARE MANAGEMENT NOTE 01/04/2012  Patient:  NOVALIE, LEAMY   Account Number:  [id number]  Date Initiated:  01/03/2012  Documentation initiated by:  Toma Erichsen  Subjective/Objective Assessment:   post op step down due to surg procedure and wound care     Action/Plan:   home   Anticipated DC Date:  01/07/2012   Anticipated DC Plan:  HOME/SELF CARE  In-house referral  NA      DC Planning Services  NA      [facility] Choice  NA   Choice offered to / List presented to:  NA   DME arranged  NA      DME agency  NA     HH arranged  NA      HH agency  NA   Status of service:  In process, will continue to follow Medicare Important Message given?  NA - LOS <3 / Initial given by admissions (If response is "NO", the following Medicare IM given date fields will be blank) Date Medicare IM given:   Date Additional Medicare IM given:    Discharge Disposition:    Per UR Regulation:  Reviewed for med. necessity/level of care/duration of stay  If discussed at Long Length of Stay Meetings, dates discussed:    Comments:  [id number]/Anarosa Kubisiak Earlene Plater, RN, BSN, CCM: CHART REVIEWED AND UPDATED.  patient to be moved up to med surg floor.  diet being advanced. NO DISCHARGE NEEDS PRESENT AT THIS TIME. CASE MANAGEMENT [phone]  [id number] Earlene Plater, RN, BSN, CCM: CHART REVIEWED AND UPDATED. NO DISCHARGE NEEDS PRESENT AT THIS TIME. CASE MANAGEMENT [phone]

## 2012-01-05 ENCOUNTER — Other Ambulatory Visit: Payer: Self-pay | Admitting: Oncology

## 2012-01-05 LAB — CBC WITH DIFFERENTIAL/PLATELET
Eosinophils Relative: 1 % (ref 0–5)
HCT: 36.7 % (ref 36.0–46.0)
Hemoglobin: 11.8 g/dL — ABNORMAL LOW (ref 12.0–15.0)
Lymphocytes Relative: 16 % (ref 12–46)
Lymphs Abs: 1 10*3/uL (ref 0.7–4.0)
MCV: 82.7 fL (ref 78.0–100.0)
Monocytes Absolute: 0.6 10*3/uL (ref 0.1–1.0)
Monocytes Relative: 8 % (ref 3–12)
RBC: 4.44 MIL/uL (ref 3.87–5.11)
WBC: 6.6 10*3/uL (ref 4.0–10.5)

## 2012-01-05 LAB — GLUCOSE, CAPILLARY

## 2012-01-05 MED ORDER — PROMETHAZINE HCL 12.5 MG PO TABS: 12.5000 mg | ORAL_TABLET | Freq: Four times a day (QID) | ORAL | Status: DC | PRN

## 2012-01-05 MED ORDER — OXYCODONE-ACETAMINOPHEN 5-325 MG/5ML PO SOLN: 5.0000 mL | ORAL | Status: DC | PRN

## 2012-01-05 NOTE — Discharge Summary (Signed)
Physician Discharge Summary  Patient ID: Valerie Ball MRN: [id number] DOB/AGE: 64-Dec-1947 64 y.o.  Admit date: 01/02/2012 Discharge date: 01/05/2012  Admission Diagnoses:  GERD with hiatus hernia  Discharge Diagnoses:  same  Active Problems:  * No active hospital problems. *    Surgery:  Laparoscopic repair of hiatus hernia and Nissen fundoplication over a 64 dilator3  Discharged Condition: improved  Hospital Course:   Had surgery and taken to step down.  UGI on day 1 showed wrap intact and no leak.  Begun on clear liquids and transferred to floor.  Ready for discharge  Consults: none  Significant Diagnostic Studies: UGI    Discharge Exam: Blood pressure 134/82, pulse 83, temperature 97.9 F (36.6 C), temperature source Oral, resp. rate 16, height 5\' 4"  (1.626 m), weight 165 lb 2 oz (74.9 kg), last menstrual period 03/20/1981, SpO2 96.00%. Incisions bland with Dermabond  Disposition: 01-Home or Self Care  Discharge Orders    Future Appointments: Provider: Department: Dept Phone: Center:   01/19/2012 1:40 PM [name], MD [facility] [phone] None   05/13/2012 10:00 AM Radene Gunning [facility] [phone] None   05/13/2012 10:30 AM Victorino December, MD [facility] [phone] None     Future Orders Please Complete By Expires   Diet - low sodium heart healthy      Increase activity slowly      Discharge instructions      Comments:   Full liquids for 1 week then pureed food for 4 weeks.   No wound care          Medication List     As of 01/05/2012  1:50 PM    STOP taking these medications         DEXILANT 60 MG capsule   Generic drug: dexlansoprazole      TAKE these medications         alendronate 70 MG tablet   Commonly known as: FOSAMAX   TAKE 1 TABLET EVERY WEEK ON AN EMPTY STOMACH WITH A FULL GLASS OF WATER, REMAIN UPRIGHT POSITION FOR 1 HOUR AFTER TAKING      enalapril 20 MG tablet   Commonly known as: VASOTEC   Take 20  mg by mouth at bedtime.      ezetimibe 10 MG tablet   Commonly known as: ZETIA   Take 10 mg by mouth at bedtime.      ferrous sulfate 325 (65 FE) MG tablet   Take 325 mg by mouth 2 (two) times daily.      FLONASE 50 MCG/ACT nasal spray   Generic drug: fluticasone   Place 2 sprays into the nose daily as needed. Allergies      oxyCODONE-acetaminophen 5-325 MG/5ML solution   Commonly known as: ROXICET   Take 5-10 mLs by mouth every 4 (four) hours as needed.      promethazine 12.5 MG tablet   Commonly known as: PHENERGAN   Take 1 tablet (12.5 mg total) by mouth every 6 (six) hours as needed for nausea.      rOPINIRole 1 MG tablet   Commonly known as: REQUIP   Take 1 mg by mouth at bedtime.      sertraline 100 MG tablet   Commonly known as: ZOLOFT   Take 100 mg by mouth at bedtime.      VITAMIN D-3 PO   Take 1,000 mg by mouth daily.           Follow-up Information  Follow up with [name], MD.   Contact information:   [address] [place] [address] [phone]          Signed: [name] 01/05/2012, 1:50 PM

## 2012-01-19 ENCOUNTER — Encounter (INDEPENDENT_AMBULATORY_CARE_PROVIDER_SITE_OTHER): Payer: Self-pay | Admitting: Surgery

## 2012-01-19 ENCOUNTER — Ambulatory Visit (INDEPENDENT_AMBULATORY_CARE_PROVIDER_SITE_OTHER): Payer: 59 | Admitting: Surgery

## 2012-01-19 VITALS — BP 168/70 | HR 96 | Temp 97.2°F | Resp 12 | Ht 64.5 in | Wt 152.8 lb

## 2012-01-19 DIAGNOSIS — Z09 Encounter for follow-up examination after completed treatment for conditions other than malignant neoplasm: Secondary | ICD-10-CM

## 2012-01-19 DIAGNOSIS — Z9889 Other specified postprocedural states: Secondary | ICD-10-CM

## 2012-01-19 HISTORY — DX: Other specified postprocedural states: Z98.890

## 2012-01-19 NOTE — Progress Notes (Signed)
Valerie Ball 66 y.o.  Body mass index is 25.82 kg/(m^2).  Procedure: Laparoscopic takedown of large type III mixed hiatus hernia with primary closure of the diaphragm with 3 sutures posteriorally (post 2 with pledgets) and Nissen fundoplication over a #56 lighted bougie      Patient Active Problem List  Diagnosis  . BREAST CANCER  . Hyperglycemia  . HYPERLIPIDEMIA  . UNSPECIFIED ANEMIA  . ANXIETY  . DISORDERS, ORGANIC SLEEP NEC  . SYNDROME, RESTLESS LEGS  . SYNDROME, CARPAL TUNNEL  . HYPERTENSION  . ALLERGIC RHINITIS  . ESOPHAGEAL STRICTURE  . GERD  . HIATAL HERNIA  . DIVERTICULOSIS-COLON  . PERITONEAL ADHESIONS  . FATTY LIVER DISEASE  . SEIZURE DISORDER  . TRANSAMINASES, SERUM, ELEVATED  . ADVERSE DRUG REACTION  . Routine general medical examination at a health care facility  . Abdominal pain, acute, epigastric  . Stress reaction  . Large type III mixed hiatus hernia     Allergies  Allergen Reactions  . Arimidex (Anastrozole)     Joint pain  . Metformin And Related     diarrhea  . Penicillins Rash    Past Surgical History  Procedure Date  . Abdominal hysterectomy 1983  . Ovary surgery 1986    ovarian tumor removed  . Mastectomy 04/2009    Bilateral for ductal carcinoma on L  . Breast surgery 02/2009    breast biopsy invasive ductal CA-bilateral mastectomies  . Epigastric hernia repair 01/02/2012    Procedure: HERNIA REPAIR EPIGASTRIC ADULT;  Surgeon: [name], MD;  Location: [facility];  Service: General;  Laterality: N/A;  Laparoscopic Repair Paraesophageal Hernia, Nissen  . Laparoscopic nissen fundoplication 01/02/2012    Procedure: LAPAROSCOPIC NISSEN FUNDOPLICATION;  Surgeon: [name], MD;  Location: [facility];  Service: General;  Laterality: N/A;   [name], MD No diagnosis found.  Doing well.  Restarting some of her meds.  She is two weeks out.  She should stay on pureed food for another 3 weeks and advance diet.  Return 2  months [name], MD, [facility] [phone] beeper [phone]  01/19/2012 2:49 PM

## 2012-01-19 NOTE — Patient Instructions (Signed)
Thanks for your patience.  If you need further assistance after leaving the office, please call our office and speak with a [facility] nurse.  [phone].  If you want to leave a message for Dr. Amalie Koran, please call his office phone at [phone]. 

## 2012-03-18 ENCOUNTER — Other Ambulatory Visit: Payer: Self-pay | Admitting: Family Medicine

## 2012-03-22 ENCOUNTER — Encounter (INDEPENDENT_AMBULATORY_CARE_PROVIDER_SITE_OTHER): Payer: Self-pay | Admitting: Surgery

## 2012-03-22 ENCOUNTER — Ambulatory Visit (INDEPENDENT_AMBULATORY_CARE_PROVIDER_SITE_OTHER): Payer: 59 | Admitting: Surgery

## 2012-03-22 VITALS — BP 136/82 | HR 98 | Temp 97.8°F | Resp 16 | Ht 64.5 in | Wt 156.0 lb

## 2012-03-22 DIAGNOSIS — Z09 Encounter for follow-up examination after completed treatment for conditions other than malignant neoplasm: Secondary | ICD-10-CM

## 2012-03-22 DIAGNOSIS — Z9889 Other specified postprocedural states: Secondary | ICD-10-CM

## 2012-03-22 NOTE — Progress Notes (Signed)
Valerie Ball 67 y.o.  Body mass index is 26.36 kg/(m^2).  Patient Active Problem List  Diagnosis  . BREAST CANCER  . Hyperglycemia  . HYPERLIPIDEMIA  . UNSPECIFIED ANEMIA  . ANXIETY  . DISORDERS, ORGANIC SLEEP NEC  . SYNDROME, RESTLESS LEGS  . SYNDROME, CARPAL TUNNEL  . HYPERTENSION  . ALLERGIC RHINITIS  . ESOPHAGEAL STRICTURE  . GERD  . HIATAL HERNIA  . DIVERTICULOSIS-COLON  . PERITONEAL ADHESIONS  . FATTY LIVER DISEASE  . SEIZURE DISORDER  . TRANSAMINASES, SERUM, ELEVATED  . ADVERSE DRUG REACTION  . Routine general medical examination at a health care facility  . Abdominal pain, acute, epigastric  . Stress reaction  . Large type III mixed hiatus hernia   . Lap Nissen October 2013    Allergies  Allergen Reactions  . Arimidex (Anastrozole)     Joint pain  . Metformin And Related     diarrhea  . Penicillins Rash    Past Surgical History  Procedure Date  . Abdominal hysterectomy 1983  . Ovary surgery 1986    ovarian tumor removed  . Mastectomy 04/2009    Bilateral for ductal carcinoma on L  . Breast surgery 02/2009    breast biopsy invasive ductal CA-bilateral mastectomies  . Epigastric hernia repair 01/02/2012    Procedure: HERNIA REPAIR EPIGASTRIC ADULT;  Surgeon: [name], MD;  Location: [facility];  Service: General;  Laterality: N/A;  Laparoscopic Repair Paraesophageal Hernia, Nissen  . Laparoscopic nissen fundoplication 01/02/2012    Procedure: LAPAROSCOPIC NISSEN FUNDOPLICATION;  Surgeon: [name], MD;  Location: [facility];  Service: General;  Laterality: N/A;   [name], MD 1. Lap Nissen October 2013     Doing very well after her hiatal hernia repair and Nissen fundoplication.  Off PPI Will be glad to see again as needed.   [name], MD, [facility] [phone] beeper [phone]  03/22/2012 2:22 PM

## 2012-03-22 NOTE — Patient Instructions (Signed)
Thanks for your patience.  If you need further assistance after leaving the office, please call our office and speak with a [facility] nurse.  [phone].  If you want to leave a message for Dr. Aiken Withem, please call his office phone at [phone]. 

## 2012-04-03 ENCOUNTER — Telehealth: Payer: Self-pay

## 2012-04-03 NOTE — Telephone Encounter (Signed)
Pt will be traveling to [place], [place], and [place] 04/28/12. Pt wants to know if needs to take any particular precautions or immunizations prior to travel. Pt has not cked on line or with the health dept.Please advise. [facility].

## 2012-04-03 NOTE — Telephone Encounter (Signed)
Pt.notified

## 2012-04-03 NOTE — Telephone Encounter (Signed)
Make appt at the travel clinic at the health dept - we do not have many of the travel vaccines needed She can get some useful info on the CDC website also re: precautions to take for those areas

## 2012-05-13 ENCOUNTER — Ambulatory Visit: Payer: 59 | Admitting: Oncology

## 2012-05-13 ENCOUNTER — Other Ambulatory Visit: Payer: 59 | Admitting: Lab

## 2012-05-13 ENCOUNTER — Other Ambulatory Visit: Payer: Self-pay | Admitting: Emergency Medicine

## 2012-06-07 ENCOUNTER — Encounter (INDEPENDENT_AMBULATORY_CARE_PROVIDER_SITE_OTHER): Payer: Self-pay | Admitting: Surgery

## 2012-06-07 ENCOUNTER — Ambulatory Visit (INDEPENDENT_AMBULATORY_CARE_PROVIDER_SITE_OTHER): Payer: 59 | Admitting: Surgery

## 2012-06-07 VITALS — BP 140/72 | HR 76 | Temp 97.3°F | Resp 18 | Ht 64.5 in | Wt 162.0 lb

## 2012-06-07 DIAGNOSIS — Z853 Personal history of malignant neoplasm of breast: Secondary | ICD-10-CM

## 2012-06-07 MED ORDER — FLUTICASONE PROPIONATE 50 MCG/ACT NA SUSP
2.0000 | Freq: Every day | NASAL | Status: DC | PRN
Start: 2012-06-07 — End: 2014-01-07

## 2012-06-07 NOTE — Progress Notes (Signed)
NAME: Valerie Ball       DOB: 1945-10-27           DATE: 06/07/2012       MRN: [id number]   Valerie Ball is a 66 y.o.Marland Kitchenfemale who presents for routine followup of her Left breast cancer stage 1diagnosed in 04/2009 and treated with bilateral mastectomy and reconstruction.. She has no problems or concerns on either side. Some seasonal allergies.  Doesn't like her implants.   PFSH: She has had no significant changes since the last visit here.  ROS: There have been no significant changes since the last visit here  EXAM: General: The patient is alert, oriented, generally healty appearing, NAD. Mood and affect are normal.  Breasts:  implants in place.  No masses bilaterally   Lymphatics: She has no axillary or supraclavicular adenopathy on either side.  Extremities: Full ROM of the surgical side with no lymphedema noted.  Data Reviewed: Dr Welton Flakes note  Impression: Doing well, with no evidence of recurrent cancer or new cancer.  Pt does not  like her implants and wants them out. I can refer if need be.  Seasonal rhinitis.  Plan: Will continue to follow up on an annual basis here. Refill flonase.

## 2012-06-07 NOTE — Patient Instructions (Signed)
Return 1 year. 

## 2012-07-09 ENCOUNTER — Other Ambulatory Visit: Payer: Self-pay | Admitting: Family Medicine

## 2012-07-09 NOTE — Telephone Encounter (Signed)
Pt thinks her insurance has changed so she is going to talk to her husband and call us back tomorrow to schedule appt and get [place] to refill med

## 2012-07-09 NOTE — Telephone Encounter (Signed)
Ok to refill? No recent appt and no future appt 

## 2012-07-09 NOTE — Telephone Encounter (Signed)
Please schedule f/u in August and refil until then, thanks

## 2012-07-26 NOTE — Telephone Encounter (Signed)
Left voicemail requesting pt to call office to schedule appt so we can refill medication

## 2012-07-29 NOTE — Telephone Encounter (Signed)
Received refill request electronically. Last office visit.10/25/11.  Patient has scheduled an appointment for 12/06/12. Is it okay to refill?

## 2012-07-29 NOTE — Telephone Encounter (Signed)
Patient called and left a message on voicemail stating that she has scheduled an appointment, but does not want the medication refilled. Patient states that she will be bringing in some new insurance information to you.

## 2012-07-29 NOTE — Telephone Encounter (Signed)
Ok-do not refil

## 2012-07-29 NOTE — Telephone Encounter (Signed)
Ok then -do not refil

## 2012-07-30 ENCOUNTER — Telehealth: Payer: Self-pay

## 2012-07-30 NOTE — Telephone Encounter (Signed)
Pt left note changing mail order pharmacies to [facility]; pt will call later when needs refills. Pt notified done.

## 2012-08-09 ENCOUNTER — Telehealth: Payer: Self-pay | Admitting: Oncology

## 2012-08-09 NOTE — Telephone Encounter (Signed)
, °

## 2012-08-21 ENCOUNTER — Telehealth: Payer: Self-pay | Admitting: Oncology

## 2012-08-21 ENCOUNTER — Other Ambulatory Visit (HOSPITAL_BASED_OUTPATIENT_CLINIC_OR_DEPARTMENT_OTHER): Payer: 59 | Admitting: Lab

## 2012-08-21 ENCOUNTER — Ambulatory Visit (HOSPITAL_BASED_OUTPATIENT_CLINIC_OR_DEPARTMENT_OTHER): Payer: 59 | Admitting: Oncology

## 2012-08-21 ENCOUNTER — Encounter: Payer: Self-pay | Admitting: Oncology

## 2012-08-21 VITALS — BP 143/84 | HR 94 | Temp 98.4°F | Resp 20 | Ht 64.5 in | Wt 163.0 lb

## 2012-08-21 DIAGNOSIS — Z9010 Acquired absence of unspecified breast and nipple: Secondary | ICD-10-CM

## 2012-08-21 DIAGNOSIS — IMO0001 Reserved for inherently not codable concepts without codable children: Secondary | ICD-10-CM | POA: Diagnosis not present

## 2012-08-21 DIAGNOSIS — C50919 Malignant neoplasm of unspecified site of unspecified female breast: Secondary | ICD-10-CM

## 2012-08-21 DIAGNOSIS — Z170 Estrogen receptor positive status [ER+]: Secondary | ICD-10-CM | POA: Diagnosis not present

## 2012-08-21 LAB — COMPREHENSIVE METABOLIC PANEL (CC13)
ALT: 35 U/L (ref 0–55)
AST: 42 U/L — ABNORMAL HIGH (ref 5–34)
CO2: 27 mEq/L (ref 22–29)
Calcium: 10 mg/dL (ref 8.4–10.4)
Chloride: 103 mEq/L (ref 98–107)
Sodium: 142 mEq/L (ref 136–145)
Total Bilirubin: 0.43 mg/dL (ref 0.20–1.20)
Total Protein: 8 g/dL (ref 6.4–8.3)

## 2012-08-21 LAB — CBC WITH DIFFERENTIAL/PLATELET
Eosinophils Absolute: 0.1 10*3/uL (ref 0.0–0.5)
MONO#: 0.5 10*3/uL (ref 0.1–0.9)
NEUT#: 3.8 10*3/uL (ref 1.5–6.5)
RBC: 4.57 10*6/uL (ref 3.70–5.45)
RDW: 14.4 % (ref 11.2–14.5)
WBC: 6.1 10*3/uL (ref 3.9–10.3)

## 2012-08-21 MED ORDER — EXEMESTANE 25 MG PO TABS: 25.0000 mg | ORAL_TABLET | Freq: Every day | ORAL | Status: DC

## 2012-08-21 NOTE — Patient Instructions (Addendum)
We discussed starting aromasin 25 mg daily  I will see you back in 6 months  Exemestane tablets What is this medicine? EXEMESTANE (ex e MES tane) blocks the production of the hormone estrogen. Some types of breast cancer depend on estrogen to grow, and this medicine can stop tumor growth by blocking estrogen production. This medicine is for the treatment of breast cancer in postmenopausal women only. This medicine may be used for other purposes; ask your health care provider or pharmacist if you have questions. What should I tell my health care provider before I take this medicine? They need to know if you have any of these conditions: -an unusual or allergic reaction to exemestane, other medicines, foods, dyes, or preservatives -pregnant or trying to get pregnant -breast-feeding How should I use this medicine? Take this medicine by mouth with a glass of water. Follow the directions on the prescription label. Take your doses at regular intervals after a meal. Do not take your medicine more often than directed. Do not stop taking except on the advice of your doctor or health care professional. Contact your pediatrician regarding the use of this medicine in children. Special care may be needed. Overdosage: If you think you have taken too much of this medicine contact a poison control center or emergency room at once. NOTE: This medicine is only for you. Do not share this medicine with others. What if I miss a dose? If you miss a dose, take the next dose as usual. Do not try to make up the missed dose. Do not take double or extra doses. What may interact with this medicine? Do not take this medicine with any of the following medications: -female hormones, like estrogens and birth control pills This medicine may also interact with the following medications: -androstenedione -phenytoin -rifabutin, rifampin, or rifapentine -St. John's Wort This list may not describe all possible interactions.  Give your health care provider a list of all the medicines, herbs, non-prescription drugs, or dietary supplements you use. Also tell them if you smoke, drink alcohol, or use illegal drugs. Some items may interact with your medicine. What should I watch for while using this medicine? Visit your doctor or health care professional for regular checks on your progress. If you experience hot flashes or sweating while taking this medicine, avoid alcohol, smoking and drinks with caffeine. This may help to decrease these side effects. What side effects may I notice from receiving this medicine? Side effects that you should report to your doctor or health care professional as soon as possible: -any new or unusual symptoms -changes in vision -fever -leg or arm swelling -pain in bones, joints, or muscles -pain in hips, back, ribs, arms, shoulders, or legs Side effects that usually do not require medical attention (report to your doctor or health care professional if they continue or are bothersome): -difficulty sleeping -headache -hot flashes -sweating -unusually weak or tired This list may not describe all possible side effects. Call your doctor for medical advice about side effects. You may report side effects to FDA at 1-800-FDA-1088. Where should I keep my medicine? Keep out of the reach of children. Store at room temperature between 15 and 30 degrees C (59 and 86 degrees F). Throw away any unused medicine after the expiration date. NOTE: This sheet is a summary. It may not cover all possible information. If you have questions about this medicine, talk to your doctor, pharmacist, or health care provider.  2013, [employer]. (07/09/2007 11:48:29 AM)

## 2012-08-21 NOTE — Telephone Encounter (Signed)
, °

## 2012-08-22 LAB — VITAMIN D 25 HYDROXY (VIT D DEFICIENCY, FRACTURES): Vit D, 25-Hydroxy: 80 ng/mL (ref 30–89)

## 2012-08-22 LAB — IRON AND TIBC
%SAT: 21 % (ref 20–55)
TIBC: 417 ug/dL (ref 250–470)
UIBC: 330 ug/dL (ref 125–400)

## 2012-08-23 ENCOUNTER — Encounter (HOSPITAL_COMMUNITY): Payer: Self-pay | Admitting: *Deleted

## 2012-08-23 ENCOUNTER — Telehealth: Payer: Self-pay | Admitting: Family Medicine

## 2012-08-23 ENCOUNTER — Emergency Department (HOSPITAL_COMMUNITY): Payer: 59

## 2012-08-23 ENCOUNTER — Emergency Department (HOSPITAL_COMMUNITY)
Admission: EM | Admit: 2012-08-23 | Discharge: 2012-08-23 | Disposition: A | Discharge status: Home / Self Care | Payer: 59 | Attending: Emergency Medicine | Admitting: Emergency Medicine

## 2012-08-23 ENCOUNTER — Other Ambulatory Visit: Payer: Self-pay | Admitting: Family Medicine

## 2012-08-23 DIAGNOSIS — Z8719 Personal history of other diseases of the digestive system: Secondary | ICD-10-CM | POA: Insufficient documentation

## 2012-08-23 DIAGNOSIS — R42 Dizziness and giddiness: Secondary | ICD-10-CM | POA: Insufficient documentation

## 2012-08-23 DIAGNOSIS — Z8739 Personal history of other diseases of the musculoskeletal system and connective tissue: Secondary | ICD-10-CM | POA: Insufficient documentation

## 2012-08-23 DIAGNOSIS — K219 Gastro-esophageal reflux disease without esophagitis: Secondary | ICD-10-CM | POA: Insufficient documentation

## 2012-08-23 DIAGNOSIS — Z853 Personal history of malignant neoplasm of breast: Secondary | ICD-10-CM | POA: Insufficient documentation

## 2012-08-23 DIAGNOSIS — I10 Essential (primary) hypertension: Secondary | ICD-10-CM | POA: Insufficient documentation

## 2012-08-23 DIAGNOSIS — Z8659 Personal history of other mental and behavioral disorders: Secondary | ICD-10-CM | POA: Insufficient documentation

## 2012-08-23 DIAGNOSIS — Z8639 Personal history of other endocrine, nutritional and metabolic disease: Secondary | ICD-10-CM | POA: Insufficient documentation

## 2012-08-23 DIAGNOSIS — Z8669 Personal history of other diseases of the nervous system and sense organs: Secondary | ICD-10-CM | POA: Insufficient documentation

## 2012-08-23 DIAGNOSIS — R0982 Postnasal drip: Secondary | ICD-10-CM | POA: Insufficient documentation

## 2012-08-23 DIAGNOSIS — Z862 Personal history of diseases of the blood and blood-forming organs and certain disorders involving the immune mechanism: Secondary | ICD-10-CM | POA: Insufficient documentation

## 2012-08-23 DIAGNOSIS — M810 Age-related osteoporosis without current pathological fracture: Secondary | ICD-10-CM | POA: Insufficient documentation

## 2012-08-23 DIAGNOSIS — E119 Type 2 diabetes mellitus without complications: Secondary | ICD-10-CM | POA: Insufficient documentation

## 2012-08-23 DIAGNOSIS — R112 Nausea with vomiting, unspecified: Secondary | ICD-10-CM | POA: Insufficient documentation

## 2012-08-23 DIAGNOSIS — R21 Rash and other nonspecific skin eruption: Secondary | ICD-10-CM | POA: Insufficient documentation

## 2012-08-23 DIAGNOSIS — J3489 Other specified disorders of nose and nasal sinuses: Secondary | ICD-10-CM | POA: Insufficient documentation

## 2012-08-23 DIAGNOSIS — Z79899 Other long term (current) drug therapy: Secondary | ICD-10-CM | POA: Insufficient documentation

## 2012-08-23 DIAGNOSIS — Z880 Allergy status to penicillin: Secondary | ICD-10-CM | POA: Insufficient documentation

## 2012-08-23 DIAGNOSIS — Z8742 Personal history of other diseases of the female genital tract: Secondary | ICD-10-CM | POA: Insufficient documentation

## 2012-08-23 DIAGNOSIS — H81399 Other peripheral vertigo, unspecified ear: Secondary | ICD-10-CM

## 2012-08-23 DIAGNOSIS — R51 Headache: Secondary | ICD-10-CM | POA: Insufficient documentation

## 2012-08-23 DIAGNOSIS — Z8619 Personal history of other infectious and parasitic diseases: Secondary | ICD-10-CM | POA: Insufficient documentation

## 2012-08-23 MED ORDER — MECLIZINE HCL 25 MG PO TABS
25.0000 mg | ORAL_TABLET | Freq: Once | ORAL | Status: AC
  Administered 2012-08-23: 25 mg via ORAL
  Filled 2012-08-23: qty 1

## 2012-08-23 MED ORDER — ONDANSETRON HCL 4 MG PO TABS: 4.0000 mg | ORAL_TABLET | Freq: Four times a day (QID) | ORAL | Status: DC

## 2012-08-23 MED ORDER — GLUCOSE BLOOD VI STRP: ORAL_STRIP | Status: DC

## 2012-08-23 MED ORDER — EZETIMIBE 10 MG PO TABS: 10.0000 mg | ORAL_TABLET | Freq: Every day | ORAL | Status: DC

## 2012-08-23 MED ORDER — SERTRALINE HCL 100 MG PO TABS: 100.0000 mg | ORAL_TABLET | Freq: Every day | ORAL | Status: DC

## 2012-08-23 MED ORDER — MECLIZINE HCL 25 MG PO TABS: 25.0000 mg | ORAL_TABLET | Freq: Three times a day (TID) | ORAL | Status: DC | PRN

## 2012-08-23 MED ORDER — ROPINIROLE HCL 1 MG PO TABS: 1.0000 mg | ORAL_TABLET | Freq: Every day | ORAL | Status: DC

## 2012-08-23 MED ORDER — ONDANSETRON 4 MG PO TBDP
4.0000 mg | ORAL_TABLET | Freq: Once | ORAL | Status: AC
  Administered 2012-08-23: 4 mg via ORAL
  Filled 2012-08-23: qty 1

## 2012-08-23 MED ORDER — ENALAPRIL MALEATE 20 MG PO TABS: 20.0000 mg | ORAL_TABLET | Freq: Every day | ORAL | Status: DC

## 2012-08-23 NOTE — Telephone Encounter (Signed)
Pt has changed mail order pharmacy and needs written rx for meds listed. Call pt when rx ready for pick up.

## 2012-08-23 NOTE — Telephone Encounter (Signed)
Please see prev note (pt needs written Rx's), please advise

## 2012-08-23 NOTE — Telephone Encounter (Signed)
Patient Information:  Caller Name: Onalee Hua  Phone: [phone]  Patient: Valerie Ball  Gender: Female  DOB: 1945-12-03  Age: 67 Years  PCP: [name] [facility])  Office Follow Up:  Does the office need to follow up with this patient?: No  Instructions For The Office: N/A  RN Note:  Per disposition Pt's husband wants pt seen in the office. Contacted the office and spoke with [place].  Per Dr [name] advised pt to go to ED [facility].  Symptoms  Reason For Call & Symptoms: Pt's husband/David states has headache,  rash on the face, N/V, diarrhea., pain under left arm.  Reviewed Health History In EMR: Yes  Reviewed Medications In EMR: Yes  Reviewed Allergies In EMR: Yes  Reviewed Surgeries / Procedures: Yes  Date of Onset of Symptoms: 08/21/2012  Guideline(s) Used:  Vomiting  Disposition Per Guideline:   Go to ED Now  Reason For Disposition Reached:   Moderate vomiting (e.g., 3 - 5 times/day) and age > 41  Advice Given:  N/A  Patient Will Follow Care Advice:  YES

## 2012-08-23 NOTE — ED Notes (Signed)
Patient C/O nausea and dizziness that began this AM.  States that she vomited X 1 after drinking some tea. Denies headaches today.  Patient is neuro intact but does C/O feeling weak.

## 2012-08-23 NOTE — Telephone Encounter (Signed)
Px printed for pick up in IN box /or fax

## 2012-08-23 NOTE — ED Provider Notes (Addendum)
History     CSN: [id number]  Arrival date & time 08/23/12  1133   First MD Initiated Contact with Patient 08/23/12 1143      Chief Complaint  Patient presents with  . Headache  . Dizziness    (Consider location/radiation/quality/duration/timing/severity/associated sxs/prior treatment) Patient is a 67 y.o. female presenting with headaches. The history is provided by the patient.  Headache Pain location:  Frontal Quality:  Dull Radiates to:  Face Severity currently:  1/10 Severity at highest:  6/10 Onset quality:  Gradual Duration:  4 days Timing:  Intermittent Progression:  Waxing and waning Chronicity:  Recurrent Similar to prior headaches: yes   Context: not exposure to bright light and not loud noise   Relieved by:  Acetaminophen Associated symptoms: congestion, dizziness, drainage, facial pain, nausea, sinus pressure and vomiting   Associated symptoms: no blurred vision, no diarrhea, no ear pain, no fever, no focal weakness, no loss of balance, no near-syncope, no neck pain, no neck stiffness, no numbness, no photophobia, no sore throat, no tingling and no weakness   Vomiting:    Quality:  Stomach contents   Number of occurrences:  1   Severity:  Mild   Timing:  Rare   Past Medical History  Diagnosis Date  . Allergy     allegic rhinitis  . Anxiety   . GERD (gastroesophageal reflux disease)   . Hypertension   . Hyperlipidemia   . Seizure disorder   . RLS (restless legs syndrome)   . Arthritis   . Diverticulosis   . GI bleed   . Cataract   . Breast cancer 02/2009    breast CA L invasive ductal CA(hormone receptor positive.)  . Salmonella 05/2000    renal effects secondary to dehydration  . Hiatal hernia   . Melena 01/2008    anemia transfusion  . Esophageal stricture 02/04/2008  . Borderline diabetic     per patient medical history form  . Menopause     per patient medical history form  . Postmenopausal hormone therapy     per patient medical history  form.  . Blood transfusion   . Osteoporosis   . Colon stricture 01/14/2003  . Diabetes mellitus     pt's medical doctor took her off diabetic meds-blood sugars have not been a problem--and medications were causing pt's sugars to be too low  . Seizures     one seizure several yrs ago --etiology unknown-no problem since  . Anemia     Past Surgical History  Procedure Laterality Date  . Abdominal hysterectomy  1983  . Ovary surgery  1986    ovarian tumor removed  . Mastectomy  04/2009    Bilateral for ductal carcinoma on L  . Breast surgery  02/2009    breast biopsy invasive ductal CA-bilateral mastectomies  . Epigastric hernia repair  01/02/2012    Procedure: HERNIA REPAIR EPIGASTRIC ADULT;  Surgeon: [name], MD;  Location: [facility];  Service: General;  Laterality: N/A;  Laparoscopic Repair Paraesophageal Hernia, Nissen  . Laparoscopic nissen fundoplication  01/02/2012    Procedure: LAPAROSCOPIC NISSEN FUNDOPLICATION;  Surgeon: [name], MD;  Location: [facility];  Service: General;  Laterality: N/A;    Family History  Problem Relation Age of Onset  . Heart attack Father   . Esophageal cancer Father   . Diabetes Father   . Cancer Father     esophageal  . Breast cancer Sister   . Diabetes Sister   .  Cancer Sister     breast  . Hypertension Brother   . Diabetes Brother   . Breast cancer Sister   . Cancer Sister     breast  . Hypertension Mother   . Stroke Mother   . Breast cancer Mother   . Cancer Mother     breast  . Hypertension Sister     History  Substance Use Topics  . Smoking status: Never Smoker   . Smokeless tobacco: Never Used  . Alcohol Use: No    OB History   Grav Para Term Preterm Abortions TAB SAB Ect Mult Living                  Review of Systems  Constitutional: Negative for fever.  HENT: Positive for congestion, postnasal drip and sinus pressure. Negative for ear pain, sore throat, neck pain, neck stiffness and dental problem.     Eyes: Negative for blurred vision and photophobia.  Cardiovascular: Negative for near-syncope.  Gastrointestinal: Positive for nausea and vomiting. Negative for diarrhea.  Skin: Positive for rash.       Itchy rash that is red bumps on her chin since starting a new make up  Neurological: Positive for dizziness and headaches. Negative for focal weakness, speech difficulty, weakness, numbness and loss of balance.  All other systems reviewed and are negative.    Allergies  Arimidex; Metformin and related; and Penicillins  Home Medications   Current Outpatient Rx  Name  Route  Sig  Dispense  Refill  . alendronate (FOSAMAX) 70 MG tablet      TAKE 1 TABLET EVERY WEEK ON AN EMPTY STOMACH WITH A FULL GLASS OF WATER, REMAIN UPRIGHT POSITION FOR 1 HOUR AFTER TAKING   1 tablet   98   . Cholecalciferol (VITAMIN D-3 PO)   Oral   Take 1,000 mg by mouth daily.          . enalapril (VASOTEC) 20 MG tablet   Oral   Take 20 mg by mouth at bedtime.         Marland Kitchen exemestane (AROMASIN) 25 MG tablet   Oral   Take 1 tablet (25 mg total) by mouth daily after breakfast.   90 tablet   12   . fluticasone (FLONASE) 50 MCG/ACT nasal spray   Nasal   Place 2 sprays into the nose daily as needed. Allergies   16 g   3   . rOPINIRole (REQUIP) 1 MG tablet   Oral   Take 1 mg by mouth at bedtime.         . sertraline (ZOLOFT) 100 MG tablet   Oral   Take 100 mg by mouth at bedtime.         Marland Kitchen ZETIA 10 MG tablet      TAKE 1 TABLET DAILY   90 tablet   1     BP 176/84  Pulse 89  Temp(Src) 98.1 F (36.7 C) (Oral)  Resp 20  SpO2 96%  LMP 03/20/1981  Physical Exam  Nursing note and vitals reviewed. Constitutional: She is oriented to person, place, and time. She appears well-developed and well-nourished. No distress.  HENT:  Head: Normocephalic and atraumatic.  Right Ear: Tympanic membrane and ear canal normal.  Left Ear: Tympanic membrane and ear canal normal.  Nose: Mucosal edema  present. Right sinus exhibits maxillary sinus tenderness and frontal sinus tenderness. Left sinus exhibits maxillary sinus tenderness and frontal sinus tenderness.  Eyes: EOM are normal. Pupils are equal,  round, and reactive to light.  Neck: Normal range of motion. Neck supple.  Cardiovascular: Normal rate, regular rhythm, normal heart sounds and intact distal pulses.  Exam reveals no friction rub.   No murmur heard. Pulmonary/Chest: Effort normal and breath sounds normal. She has no wheezes. She has no rales.  Abdominal: Soft. Bowel sounds are normal. She exhibits no distension. There is no tenderness. There is no rebound and no guarding.  Musculoskeletal: Normal range of motion. She exhibits no tenderness.  No edema  Lymphadenopathy:    She has no cervical adenopathy.  Neurological: She is alert and oriented to person, place, and time. She has normal strength. No cranial nerve deficit or sensory deficit. Coordination and gait normal.  No photophobia or nystagmus.  Pt able to ambulate without ataxia  Skin: Skin is warm and dry. Lesion and rash noted. Rash is papular.     4-5 small excoriated papular lesions on the chin alone  Psychiatric: She has a normal mood and affect. Her behavior is normal.    ED Course  Procedures (including critical care time)  Labs Reviewed - No data to display Ct Maxillofacial  Ltd Wo Cm  08/23/2012   *RADIOLOGY REPORT*  Clinical Data:  Sinus pain, dizziness  CT PARANASAL SINUS LIMITED WITHOUT CONTRAST  Technique:  Multidetector CT images of the paranasal sinuses were obtained in a single plane without contrast.  Comparison:  CT head dated 10/19/2004  Findings:  The visualized paranasal sinuses are clear.  The mastoid air cells are opacified.  The bilateral orbits, including the globes and retroconal soft tissues, are within normal limits.  The visualized brain parenchyma is grossly unremarkable.  IMPRESSION: Negative limited sinus CT.   Original Report  Authenticated By: Charline Bills, M.D.     1. Peripheral vertigo, unspecified       MDM   Patient presenting with dizziness today that she describes as vertiginous symptoms worse with walking and nausea and vomiting. She states for the last 4 days she's had worsening sinus congestion with headaches behind her eyes. She denies any vision changes, focal weakness, dysarthria, dysphasia, ataxia.   No focal neurologic deficits on exam. Feel most likely the patient has a sinus infection that's causing her vertigo. Feel that this is most suggestive of peripheral vertigo and not central vertigo. Patient given Zofran and meclizine. CT of the sinuses pending for further evaluation. Patient uses Flonase daily but has not been on any antibiotics.  1:07 PM Sinus CT neg for acute or chronic sinusitis.  Feel that pt's sx are URI with worsening congestion.  Pt improved after zofran and meclizine and able to ambulate in the department      Gwyneth Sprout, MD 08/23/12 1308  Gwyneth Sprout, MD 08/23/12 1315

## 2012-08-23 NOTE — Telephone Encounter (Signed)
Pulled Dr. [name] out of a room and let her know pt's symptoms and she agreed with CAN and advise pt to go to ER, I advise CAN to have pt go to ER

## 2012-08-23 NOTE — ED Notes (Signed)
Pt reports waking up with headaches for several days. This am, having sinus congestion, headache, dizziness and n/v.

## 2012-08-26 NOTE — Telephone Encounter (Signed)
Left voicemail requesting pt to call office 

## 2012-08-26 NOTE — Telephone Encounter (Signed)
Pt left v/m requesting call back. 

## 2012-08-26 NOTE — Telephone Encounter (Signed)
Pt wanted Rx's faxed to [facility], Rx's faxed to pharmacy per pt request

## 2012-09-15 NOTE — Progress Notes (Signed)
OFFICE PROGRESS NOTE  CC  [name], MD [address] [place] [address] [place] [address] Dr. Harriette Bouillon  DIAGNOSIS: 67 year old female with stage I invasive ductal carcinoma of the left breast patient is status post bilateral mastectomies in February 2011.  PRIOR THERAPY:  #1 patient was diagnosed with invasive ductal carcinoma of the left breast.  #2 she went on to have therapeutic left mastectomy with prophylactic right mastectomy for 8 ER positive PR positive HER-2/neu negative invasive ductal carcinoma stage I in February 2011.  #3 she had immediate reconstruction performed.  #4 patient has been on Arimidex 1 mg daily However this had to be discontinued about a month ago do to aches and pains.  #4 we will now begin Aromasin 25 mg daily beginning 08/21/2012. Patient understands rationale and side effects.  CURRENT THERAPY: Aromasin 25 mg daily  INTERVAL HISTORY: Valerie Ball 67 y.o. female returns for Followup visit today. Clinically she seems to be about the same. She had called [place] and was seen by [place] for her aches and pains. We discontinued the Arimidex. The aches and pains remain. She otherwise has no other complaints  MEDICAL HISTORY: Past Medical History  Diagnosis Date  . Allergy     allegic rhinitis  . Anxiety   . GERD (gastroesophageal reflux disease)   . Hypertension   . Hyperlipidemia   . Seizure disorder   . RLS (restless legs syndrome)   . Arthritis   . Diverticulosis   . GI bleed   . Cataract   . Breast cancer 02/2009    breast CA L invasive ductal CA(hormone receptor positive.)  . Salmonella 05/2000    renal effects secondary to dehydration  . Hiatal hernia   . Melena 01/2008    anemia transfusion  . Esophageal stricture 02/04/2008  . Borderline diabetic     per patient medical history form  . Menopause     per patient medical history form  . Postmenopausal hormone therapy     per patient medical history form.  .  Blood transfusion   . Osteoporosis   . Colon stricture 01/14/2003  . Diabetes mellitus     pt's medical doctor took her off diabetic meds-blood sugars have not been a problem--and medications were causing pt's sugars to be too low  . Seizures     one seizure several yrs ago --etiology unknown-no problem since  . Anemia     ALLERGIES:  is allergic to arimidex; metformin and related; and penicillins.  MEDICATIONS:  Current Outpatient Prescriptions  Medication Sig Dispense Refill  . Cholecalciferol (VITAMIN D-3 PO) Take 1,000 mg by mouth daily.       . fluticasone (FLONASE) 50 MCG/ACT nasal spray Place 2 sprays into the nose daily as needed. Allergies  16 g  3  . alendronate (FOSAMAX) 70 MG tablet Take 70 mg by mouth every 7 (seven) days. Take with a full glass of water on an empty stomach.      . enalapril (VASOTEC) 20 MG tablet Take 1 tablet (20 mg total) by mouth at bedtime.  90 tablet  1  . exemestane (AROMASIN) 25 MG tablet Take 1 tablet (25 mg total) by mouth daily after breakfast.  90 tablet  12  . ezetimibe (ZETIA) 10 MG tablet Take 1 tablet (10 mg total) by mouth daily.  90 tablet  1  . glucose blood (TRUETEST TEST) test strip USE TO CHECK BLOOD GLUCOSE TWICE DAILY FOR DM 250.0  100 each  1  . meclizine (ANTIVERT) 25 MG tablet Take 1 tablet (25 mg total) by mouth 3 (three) times daily as needed for dizziness or nausea.  30 tablet  0  . ondansetron (ZOFRAN) 4 MG tablet Take 1 tablet (4 mg total) by mouth every 6 (six) hours.  12 tablet  0  . rOPINIRole (REQUIP) 1 MG tablet Take 1 tablet (1 mg total) by mouth at bedtime.  90 tablet  1  . sertraline (ZOLOFT) 100 MG tablet Take 1 tablet (100 mg total) by mouth at bedtime.  90 tablet  1  . [DISCONTINUED] cetirizine (ZYRTEC) 10 MG tablet Take 10 mg by mouth daily as needed. For allergy symptoms      . [DISCONTINUED] hydrochlorothiazide 25 MG tablet Take 25 mg by mouth daily.         No current facility-administered medications for this  visit.    SURGICAL HISTORY:  Past Surgical History  Procedure Laterality Date  . Abdominal hysterectomy  1983  . Ovary surgery  1986    ovarian tumor removed  . Mastectomy  04/2009    Bilateral for ductal carcinoma on L  . Breast surgery  02/2009    breast biopsy invasive ductal CA-bilateral mastectomies  . Epigastric hernia repair  01/02/2012    Procedure: HERNIA REPAIR EPIGASTRIC ADULT;  Surgeon: [name], MD;  Location: [facility];  Service: General;  Laterality: N/A;  Laparoscopic Repair Paraesophageal Hernia, Nissen  . Laparoscopic nissen fundoplication  01/02/2012    Procedure: LAPAROSCOPIC NISSEN FUNDOPLICATION;  Surgeon: [name], MD;  Location: [facility];  Service: General;  Laterality: N/A;    REVIEW OF SYSTEMS:  Pertinent items are noted in HPI.   PHYSICAL EXAMINATION: General appearance: alert, cooperative and appears stated age Lymph nodes: Cervical, supraclavicular, and axillary nodes normal. Resp: clear to auscultation bilaterally Back: symmetric, no curvature. ROM normal. No CVA tenderness. Cardio: regular rate and rhythm, S1, S2 normal, no murmur, click, rub or gallop GI: soft, non-tender; bowel sounds normal; no masses,  no organomegaly Extremities: extremities normal, atraumatic, no cyanosis or edema Neurologic: Grossly normal  ECOG PERFORMANCE STATUS: 1 - Symptomatic but completely ambulatory  Blood pressure 143/84, pulse 94, temperature 98.4 F (36.9 C), temperature source Oral, resp. rate 20, height 5' 4.5" (1.638 m), weight 163 lb (73.936 kg), last menstrual period 03/20/1981.  LABORATORY DATA: Lab Results  Component Value Date   WBC 6.1 08/21/2012   HGB 13.0 08/21/2012   HCT 38.0 08/21/2012   MCV 83.1 08/21/2012   PLT 138* 08/21/2012      Chemistry      Component Value Date/Time   NA 142 08/21/2012 1144   NA 142 12/29/2011 0930   NA 135 10/14/2009 1409   K 4.1 08/21/2012 1144   K 4.3 12/29/2011 0930   K 3.7 10/14/2009 1409   CL 103 08/21/2012 1144    CL 103 12/29/2011 0930   CL 96* 10/14/2009 1409   CO2 27 08/21/2012 1144   CO2 28 12/29/2011 0930   CO2 28 10/14/2009 1409   BUN 15.3 08/21/2012 1144   BUN 17 12/29/2011 0930   BUN 16 10/14/2009 1409   CREATININE 0.9 08/21/2012 1144   CREATININE 0.70 01/02/2012 1727   CREATININE 0.8 10/14/2009 1409      Component Value Date/Time   CALCIUM 10.0 08/21/2012 1144   CALCIUM 9.8 12/29/2011 0930   CALCIUM 9.2 10/14/2009 1409   ALKPHOS 98 08/21/2012 1144   ALKPHOS 76 10/04/2011 1024  ALKPHOS 75 10/14/2009 1409   AST 42* 08/21/2012 1144   AST 37 10/04/2011 1024   AST 98* 10/14/2009 1409   ALT 35 08/21/2012 1144   ALT 29 10/04/2011 1024   BILITOT 0.43 08/21/2012 1144   BILITOT 0.3 10/04/2011 1024   BILITOT 0.50 10/14/2009 1409       RADIOGRAPHIC STUDIES:  Ct Virtual Colonoscopy Screening  09/20/2011  *RADIOLOGY REPORT*  Clinical Data: Incomplete colonoscopy 5 years ago.  Now with diarrhea and constipation.  Abdominal pain and nausea.  History of breast cancer.  CT VIRTUAL COLONOSCOPY FOR SCREENING Technique:   The patient was given a standard LoSo bowel preparation with Gastrografin and barium for fluid and stool tagging respectively.  The quality of the bowel preparation is excellent.  Automated CO2 insufflation of the colon was performed prior to image acquisition and colonic distention is moderate. Image post processing was used to generate a 3D endoluminal fly- through projection of the colon and to electronically subtract stool/fluid as appropriate.  Comparison:   None  Findings:  The sigmoid colon is affected by diverticular change with hypertrophic muscular wall thickening.  This results in inadequate distention of the proximal portion of the redundant sigmoid colon. The cecum, ascending colon, hepatic flexure, transverse colon, splenic flexure, and descending colon are well distended and clearly visualized.  5 - 6 mm tiny sessile polyp is identified in the cecum.  Otherwise no clinically significant colonic  polyp or mass is identified in the colon.  Advanced diverticular changes are seen in the sigmoid colon.  No evidence for an overt colonic stricture.  IMPRESSION: 5-6 mm tiny sessile polyp in the cecum.   C-RADS category C-0 exam secondary to incomplete/inadequate visualization of the sigmoid colon.  This area could likely be visualized by flexible sigmoidoscopy.  Remaining segments of the colon are well evaluated.  Virtual colonoscopy is not designed to detect diminutive polyps (i.e., less than or equal to 5 mm), the presence or absence of which may not affect clinical management.  CT ABDOMEN AND PELVIS WITHOUT CONTRAST  Findings:  Moderate-to-large hiatal hernia noted with approximately 50% of the stomach in the chest.  No focal abnormality is seen within the liver or spleen on this study performed without intravenous contrast material.  Duodenum, pancreas, gallbladder, adrenal glands, and kidneys are unremarkable.  Imaging through the pelvis shows no free intraperitoneal fluid. There is no pelvic sidewall lymphadenopathy.  Uterus is surgically absent.  No adnexal mass.  IMPRESSION:  Unremarkable uninfused CT scan of the abdomen and pelvis.  Original Report Authenticated By: ERIC A. MANSELL, M.D.   Dg Abd 2 Views  09/04/2011  *RADIOLOGY REPORT*  Clinical Data: Epigastric pain.  ABDOMEN - 2 VIEW  Comparison: CT 03/25/2011  Findings: Moderate stool burden throughout the colon. There is normal bowel gas pattern.  No free air.  No organomegaly or suspicious calcification.  No acute bony abnormality.  IMPRESSION: Moderate stool burden.  No acute findings.  Original Report Authenticated By: Cyndie Chime, M.D.    ASSESSMENT: 67 year old female with  #1 stage I invasive ductal carcinoma of the left breast status post bilateral mastectomies for a ER positive cancer HER-2/neu negative. Patient subsequently underwent immediate reconstruction bilaterally.  #2 patient now with myalgias and arthralgias likely  secondary to her adjuvant antiestrogen Arimidex.we had held the Arimidex for a month. Her aches and pains are slightly better but not significantly improved. She thinks that it is not the best cancer medicine that is causing her problems.  #  3 I have recommended we begin Aromasin 25 mg daily to see if we can minimize her myalgias and arthralgias. We discussed the risks and benefits and side effects.   PLAN:   #1 begin Aromasin 25 mg daily.  #2 I will plan on seeing the patient back in 6 months time.   All questions were answered. The patient knows to call the clinic with any problems, questions or concerns. We can certainly see the patient much sooner if necessary.  I spent 25 minutes counseling the patient face to face. The total time spent in the appointment was 30 minutes.    Drue Second, MD Medical/Oncology [facility] [phone] (beeper) [phone] (Office)

## 2012-10-23 ENCOUNTER — Other Ambulatory Visit: Payer: Self-pay

## 2012-11-28 ENCOUNTER — Telehealth: Payer: Self-pay | Admitting: Family Medicine

## 2012-11-28 DIAGNOSIS — R739 Hyperglycemia, unspecified: Secondary | ICD-10-CM

## 2012-11-28 DIAGNOSIS — I10 Essential (primary) hypertension: Secondary | ICD-10-CM

## 2012-11-28 DIAGNOSIS — D649 Anemia, unspecified: Secondary | ICD-10-CM

## 2012-11-28 DIAGNOSIS — E785 Hyperlipidemia, unspecified: Secondary | ICD-10-CM

## 2012-11-28 NOTE — Telephone Encounter (Signed)
orders

## 2012-11-28 NOTE — Telephone Encounter (Signed)
Message copied by [name] on Thu Nov 28, 2012  4:58 PM ------      Message from: Alvina Chou      Created: Thu Nov 21, 2012  3:39 PM      Regarding: Lab orders for Friday, 9.12.14       Patient is scheduled for CPX labs, please order future labs, Thanks , Terri       ------

## 2012-11-29 ENCOUNTER — Other Ambulatory Visit (INDEPENDENT_AMBULATORY_CARE_PROVIDER_SITE_OTHER): Payer: 59

## 2012-11-29 DIAGNOSIS — R7309 Other abnormal glucose: Secondary | ICD-10-CM | POA: Diagnosis not present

## 2012-11-29 DIAGNOSIS — E785 Hyperlipidemia, unspecified: Secondary | ICD-10-CM | POA: Diagnosis not present

## 2012-11-29 DIAGNOSIS — K7689 Other specified diseases of liver: Secondary | ICD-10-CM

## 2012-11-29 DIAGNOSIS — D649 Anemia, unspecified: Secondary | ICD-10-CM | POA: Diagnosis not present

## 2012-11-29 DIAGNOSIS — R739 Hyperglycemia, unspecified: Secondary | ICD-10-CM

## 2012-11-29 DIAGNOSIS — I10 Essential (primary) hypertension: Secondary | ICD-10-CM | POA: Diagnosis not present

## 2012-11-29 DIAGNOSIS — Z0000 Encounter for general adult medical examination without abnormal findings: Secondary | ICD-10-CM

## 2012-11-29 LAB — LDL CHOLESTEROL, DIRECT: Direct LDL: 117.3 mg/dL

## 2012-11-29 LAB — CBC WITH DIFFERENTIAL/PLATELET
Basophils Absolute: 0 10*3/uL (ref 0.0–0.1)
Eosinophils Relative: 2.2 % (ref 0.0–5.0)
MCV: 83.6 fl (ref 78.0–100.0)
Monocytes Absolute: 0.4 10*3/uL (ref 0.1–1.0)
Neutrophils Relative %: 61.3 % (ref 43.0–77.0)
Platelets: 152 10*3/uL (ref 150.0–400.0)
RDW: 14.6 % (ref 11.5–14.6)
WBC: 6.1 10*3/uL (ref 4.5–10.5)

## 2012-11-29 LAB — COMPREHENSIVE METABOLIC PANEL
ALT: 35 U/L (ref 0–35)
AST: 42 U/L — ABNORMAL HIGH (ref 0–37)
Albumin: 4.2 g/dL (ref 3.5–5.2)
Alkaline Phosphatase: 64 U/L (ref 39–117)
Glucose, Bld: 132 mg/dL — ABNORMAL HIGH (ref 70–99)
Potassium: 4.2 mEq/L (ref 3.5–5.1)
Sodium: 142 mEq/L (ref 135–145)
Total Bilirubin: 0.6 mg/dL (ref 0.3–1.2)
Total Protein: 7.7 g/dL (ref 6.0–8.3)

## 2012-11-29 LAB — LIPID PANEL
HDL: 32.2 mg/dL — ABNORMAL LOW (ref >39.00)
Total CHOL/HDL Ratio: 6
VLDL: 53 mg/dL — ABNORMAL HIGH (ref 0.0–40.0)

## 2012-11-29 LAB — TSH: TSH: 2.11 u[IU]/mL (ref 0.35–5.50)

## 2012-12-06 ENCOUNTER — Encounter: Payer: Self-pay | Admitting: Family Medicine

## 2012-12-06 ENCOUNTER — Ambulatory Visit (INDEPENDENT_AMBULATORY_CARE_PROVIDER_SITE_OTHER): Payer: 59 | Admitting: Family Medicine

## 2012-12-06 VITALS — BP 132/80 | HR 92 | Temp 98.6°F | Ht 63.5 in | Wt 163.0 lb

## 2012-12-06 DIAGNOSIS — Z23 Encounter for immunization: Secondary | ICD-10-CM | POA: Diagnosis not present

## 2012-12-06 DIAGNOSIS — I10 Essential (primary) hypertension: Secondary | ICD-10-CM | POA: Diagnosis not present

## 2012-12-06 DIAGNOSIS — R739 Hyperglycemia, unspecified: Secondary | ICD-10-CM

## 2012-12-06 DIAGNOSIS — E785 Hyperlipidemia, unspecified: Secondary | ICD-10-CM | POA: Diagnosis not present

## 2012-12-06 DIAGNOSIS — Z0000 Encounter for general adult medical examination without abnormal findings: Secondary | ICD-10-CM | POA: Diagnosis not present

## 2012-12-06 DIAGNOSIS — R7309 Other abnormal glucose: Secondary | ICD-10-CM | POA: Diagnosis not present

## 2012-12-06 NOTE — Progress Notes (Signed)
Subjective:    Patient ID: Valerie Ball, female    DOB: 06/28/1945, 67 y.o.   MRN: [id number]  HPI Here for health maintenance exam and to review chronic medical problems    Wt is stable with bmi of 28  Zoster status- she wants to get if her ins covers  Pneumonia vaccine 11/09-not quite 4 years then  Flu vaccine- wants to get it today  Td 5/13  Hx of b ca with bilat mastectomy-no longer gets mammo On aromasin- so far has done fine  Osteopenia dexa 5/11 - Dr Park Breed will set that up  On fosamax -no problems  Ca and D - she does not tol ca well  Walking some for exercise -not on a regular schedule (needs to to help her arthritis)  colonosc - unable to finish 2004- quit trying to do them after that  Then had a virtual colonoscpy in 2013  Hyperglycemia  Lab Results  Component Value Date   HGBA1C 6.7* 11/29/2012  she does not eat sweets  Some carbohydrates  Exercise is spotting   Lab Results  Component Value Date   CHOL 184 11/29/2012   CHOL 168 07/13/2011   CHOL 166 02/28/2011   Lab Results  Component Value Date   HDL 32.20* 11/29/2012   HDL 37.70* 07/13/2011   HDL 36.80* 02/28/2011   No results found for this basename: [facility]   Lab Results  Component Value Date   TRIG 265.0* 11/29/2012   TRIG 249.0* 07/13/2011   TRIG 368.0* 02/28/2011   Lab Results  Component Value Date   CHOLHDL 6 11/29/2012   CHOLHDL 4 07/13/2011   CHOLHDL 5 02/28/2011   Lab Results  Component Value Date   LDLDIRECT 117.3 11/29/2012   LDLDIRECT 98.2 07/13/2011   LDLDIRECT 85.5 02/28/2011   not eating quite as well-more red meat  More problems with her stomach since the fundoplication- gas and diarrhea at times (IBS )  Pap 04 Had hysterectomy No gyn issues   Mood -- more stress having her husband is home - is difficult on her  She is more irritable at times  She has to deal with his depression She does not think she is depressed -she stays motivated and finds joy in other  things  Patient Active Problem List   Diagnosis Date Noted  . History of breast cancer 06/07/2012  . Lap Nissen October 2013 01/19/2012  . Large type III mixed hiatus hernia  12/06/2011  . Stress reaction 10/25/2011  . Abdominal pain, acute, epigastric 09/04/2011  . Routine general medical examination at a health care facility 07/11/2011  . ADVERSE DRUG REACTION 05/05/2010  . TRANSAMINASES, SERUM, ELEVATED 11/11/2009  . BREAST CANCER 08/02/2009  . DIVERTICULOSIS-COLON 03/03/2008  . ESOPHAGEAL STRICTURE 03/02/2008  . HIATAL HERNIA 03/02/2008  . PERITONEAL ADHESIONS 03/02/2008  . UNSPECIFIED ANEMIA 02/07/2008  . Hyperglycemia 08/19/2007  . ANXIETY 04/15/2007  . DISORDERS, ORGANIC SLEEP NEC 08/23/2006  . SYNDROME, RESTLESS LEGS 08/23/2006  . SYNDROME, CARPAL TUNNEL 08/23/2006  . HYPERLIPIDEMIA 08/22/2006  . HYPERTENSION 08/22/2006  . ALLERGIC RHINITIS 08/22/2006  . GERD 08/22/2006  . FATTY LIVER DISEASE 08/22/2006  . SEIZURE DISORDER 08/22/2006   Past Medical History  Diagnosis Date  . Allergy     allegic rhinitis  . Anxiety   . GERD (gastroesophageal reflux disease)   . Hypertension   . Hyperlipidemia   . Seizure disorder   . RLS (restless legs syndrome)   . Arthritis   . Diverticulosis   .  GI bleed   . Cataract   . Breast cancer 02/2009    breast CA L invasive ductal CA(hormone receptor positive.)  . Salmonella 05/2000    renal effects secondary to dehydration  . Hiatal hernia   . Melena 01/2008    anemia transfusion  . Esophageal stricture 02/04/2008  . Borderline diabetic     per patient medical history form  . Menopause     per patient medical history form  . Postmenopausal hormone therapy     per patient medical history form.  . Blood transfusion   . Osteoporosis   . Colon stricture 01/14/2003  . Diabetes mellitus     pt's medical doctor took her off diabetic meds-blood sugars have not been a problem--and medications were causing pt's sugars to be too  low  . Seizures     one seizure several yrs ago --etiology unknown-no problem since  . Anemia    Past Surgical History  Procedure Laterality Date  . Abdominal hysterectomy  1983  . Ovary surgery  1986    ovarian tumor removed  . Mastectomy  04/2009    Bilateral for ductal carcinoma on L  . Breast surgery  02/2009    breast biopsy invasive ductal CA-bilateral mastectomies  . Epigastric hernia repair  01/02/2012    Procedure: HERNIA REPAIR EPIGASTRIC ADULT;  Surgeon: [name], MD;  Location: [facility];  Service: General;  Laterality: N/A;  Laparoscopic Repair Paraesophageal Hernia, Nissen  . Laparoscopic nissen fundoplication  01/02/2012    Procedure: LAPAROSCOPIC NISSEN FUNDOPLICATION;  Surgeon: [name], MD;  Location: [facility];  Service: General;  Laterality: N/A;   History  Substance Use Topics  . Smoking status: Never Smoker   . Smokeless tobacco: Never Used  . Alcohol Use: No   Family History  Problem Relation Age of Onset  . Heart attack Father   . Esophageal cancer Father   . Diabetes Father   . Cancer Father     esophageal  . Breast cancer Sister   . Diabetes Sister   . Cancer Sister     breast  . Hypertension Brother   . Diabetes Brother   . Breast cancer Sister   . Cancer Sister     breast  . Hypertension Mother   . Stroke Mother   . Breast cancer Mother   . Cancer Mother     breast  . Hypertension Sister    Allergies  Allergen Reactions  . Arimidex [Anastrozole]     Joint pain  . Metformin And Related     diarrhea  . Penicillins Rash   Current Outpatient Prescriptions on File Prior to Visit  Medication Sig Dispense Refill  . alendronate (FOSAMAX) 70 MG tablet Take 70 mg by mouth every 7 (seven) days. Take with a full glass of water on an empty stomach.      . Cholecalciferol (VITAMIN D-3 PO) Take 1,000 mg by mouth daily.       . enalapril (VASOTEC) 20 MG tablet Take 1 tablet (20 mg total) by mouth at bedtime.  90 tablet  1  .  exemestane (AROMASIN) 25 MG tablet Take 1 tablet (25 mg total) by mouth daily after breakfast.  90 tablet  12  . ezetimibe (ZETIA) 10 MG tablet Take 1 tablet (10 mg total) by mouth daily.  90 tablet  1  . fluticasone (FLONASE) 50 MCG/ACT nasal spray Place 2 sprays into the nose daily as needed. Allergies  16 g  3  . glucose blood (TRUETEST TEST) test strip USE TO CHECK BLOOD GLUCOSE TWICE DAILY FOR DM 250.0  100 each  1  . rOPINIRole (REQUIP) 1 MG tablet Take 1 tablet (1 mg total) by mouth at bedtime.  90 tablet  1  . sertraline (ZOLOFT) 100 MG tablet Take 1 tablet (100 mg total) by mouth at bedtime.  90 tablet  1  . [DISCONTINUED] cetirizine (ZYRTEC) 10 MG tablet Take 10 mg by mouth daily as needed. For allergy symptoms      . [DISCONTINUED] hydrochlorothiazide 25 MG tablet Take 25 mg by mouth daily.         No current facility-administered medications on file prior to visit.    Review of Systems Review of Systems  Constitutional: Negative for fever, appetite change, fatigue and unexpected weight change.  Eyes: Negative for pain and visual disturbance.  Respiratory: Negative for cough and shortness of breath.   Cardiovascular: Negative for cp or palpitations    Gastrointestinal: Negative for nausea, diarrhea and constipation.  Genitourinary: Negative for urgency and frequency.  Skin: Negative for pallor or rash   Neurological: Negative for weakness, light-headedness, numbness and headaches.  Hematological: Negative for adenopathy. Does not bruise/bleed easily.  Psychiatric/Behavioral: Negative for dysphoric mood. The patient is not nervous/anxious.  pos for stressors       Objective:   Physical Exam  Constitutional: She appears well-developed and well-nourished. No distress.  overwt and well appearing   HENT:  Head: Normocephalic and atraumatic.  Right Ear: External ear normal.  Left Ear: External ear normal.  Nose: Nose normal.  Mouth/Throat: Oropharynx is clear and moist.   Eyes: Conjunctivae and EOM are normal. Pupils are equal, round, and reactive to light. Right eye exhibits no discharge. Left eye exhibits no discharge. No scleral icterus.  Neck: Normal range of motion. Neck supple. No JVD present. Carotid bruit is not present. No thyromegaly present.  Cardiovascular: Normal rate, regular rhythm, normal heart sounds and intact distal pulses.  Exam reveals no gallop.   Pulmonary/Chest: Effort normal and breath sounds normal. No respiratory distress. She has no wheezes.  Abdominal: Soft. Bowel sounds are normal. She exhibits no distension, no abdominal bruit and no mass. There is no tenderness.  Musculoskeletal: She exhibits no edema and no tenderness.  Lymphadenopathy:    She has no cervical adenopathy.  Neurological: She is alert. She has normal reflexes. No cranial nerve deficit. She exhibits normal muscle tone. Coordination normal.  Skin: Skin is warm and dry. No rash noted. No erythema. No pallor.  Psychiatric: She has a normal mood and affect.          Assessment & Plan:

## 2012-12-06 NOTE — Patient Instructions (Addendum)
If you are interested in a shingles/zoster vaccine - call your insurance to check on coverage,( you should not get it within 1 month of other vaccines) , then call us for a prescription  for it to take to a pharmacy that gives the shot , or make a nurse visit to get it here depending on your coverage Flu vaccine today Avoid red meat/ fried foods/ egg yolks/ fatty breakfast meats/ butter, cheese and high fat dairy/ and shellfish   Get back to regular exercise  Follow up in about 6 months with labs prior

## 2012-12-08 NOTE — Assessment & Plan Note (Signed)
bp in fair control at this time  No changes needed  Disc lifstyle change with low sodium diet and exercise  Labs reviewed  

## 2012-12-08 NOTE — Assessment & Plan Note (Signed)
Reviewed health habits including diet and exercise and skin cancer prevention Also reviewed health mt list, fam hx and immunizations   Disc imp of better diet and exercise Will watch for depression given stressors with husband

## 2012-12-08 NOTE — Assessment & Plan Note (Signed)
A1c is up - and pt has not been as good with diet and exercise  Rev low glycemic diet and goals for that  Recommend exericse 5 d per week  Will re check this in 6 mo

## 2012-12-08 NOTE — Assessment & Plan Note (Signed)
Disc goals for lipids and reasons to control them Rev labs with pt Rev low sat fat diet in detail This did go up - will work harder on diet and exercise

## 2012-12-11 ENCOUNTER — Ambulatory Visit: Payer: TRICARE For Life (TFL)

## 2013-01-03 ENCOUNTER — Other Ambulatory Visit: Payer: Self-pay | Admitting: Family Medicine

## 2013-01-03 MED ORDER — ENALAPRIL MALEATE 20 MG PO TABS: 20.0000 mg | ORAL_TABLET | Freq: Every day | ORAL | Status: DC

## 2013-01-03 MED ORDER — SERTRALINE HCL 100 MG PO TABS: 100.0000 mg | ORAL_TABLET | Freq: Every day | ORAL | Status: DC

## 2013-01-03 NOTE — Telephone Encounter (Signed)
done

## 2013-01-03 NOTE — Telephone Encounter (Signed)
Please refill both meds for a year

## 2013-01-03 NOTE — Telephone Encounter (Signed)
Fax refill request, please advise  

## 2013-01-10 ENCOUNTER — Encounter (HOSPITAL_COMMUNITY): Payer: Self-pay | Admitting: Emergency Medicine

## 2013-01-10 ENCOUNTER — Emergency Department (HOSPITAL_COMMUNITY)
Admission: EM | Admit: 2013-01-10 | Discharge: 2013-01-10 | Disposition: A | Discharge status: Home / Self Care | Payer: 59 | Attending: Emergency Medicine | Admitting: Emergency Medicine

## 2013-01-10 ENCOUNTER — Emergency Department (HOSPITAL_COMMUNITY): Payer: 59

## 2013-01-10 DIAGNOSIS — F411 Generalized anxiety disorder: Secondary | ICD-10-CM | POA: Diagnosis not present

## 2013-01-10 DIAGNOSIS — Z8619 Personal history of other infectious and parasitic diseases: Secondary | ICD-10-CM | POA: Insufficient documentation

## 2013-01-10 DIAGNOSIS — G40909 Epilepsy, unspecified, not intractable, without status epilepticus: Secondary | ICD-10-CM | POA: Insufficient documentation

## 2013-01-10 DIAGNOSIS — M810 Age-related osteoporosis without current pathological fracture: Secondary | ICD-10-CM | POA: Diagnosis not present

## 2013-01-10 DIAGNOSIS — E119 Type 2 diabetes mellitus without complications: Secondary | ICD-10-CM | POA: Insufficient documentation

## 2013-01-10 DIAGNOSIS — Y939 Activity, unspecified: Secondary | ICD-10-CM | POA: Insufficient documentation

## 2013-01-10 DIAGNOSIS — M129 Arthropathy, unspecified: Secondary | ICD-10-CM | POA: Diagnosis not present

## 2013-01-10 DIAGNOSIS — G2581 Restless legs syndrome: Secondary | ICD-10-CM | POA: Diagnosis not present

## 2013-01-10 DIAGNOSIS — S93609A Unspecified sprain of unspecified foot, initial encounter: Secondary | ICD-10-CM | POA: Diagnosis not present

## 2013-01-10 DIAGNOSIS — N951 Menopausal and female climacteric states: Secondary | ICD-10-CM | POA: Insufficient documentation

## 2013-01-10 DIAGNOSIS — Z79899 Other long term (current) drug therapy: Secondary | ICD-10-CM | POA: Insufficient documentation

## 2013-01-10 DIAGNOSIS — E785 Hyperlipidemia, unspecified: Secondary | ICD-10-CM | POA: Insufficient documentation

## 2013-01-10 DIAGNOSIS — M7989 Other specified soft tissue disorders: Secondary | ICD-10-CM | POA: Diagnosis not present

## 2013-01-10 DIAGNOSIS — I10 Essential (primary) hypertension: Secondary | ICD-10-CM | POA: Insufficient documentation

## 2013-01-10 DIAGNOSIS — Y929 Unspecified place or not applicable: Secondary | ICD-10-CM | POA: Insufficient documentation

## 2013-01-10 DIAGNOSIS — W64XXXA Exposure to other animate mechanical forces, initial encounter: Secondary | ICD-10-CM | POA: Insufficient documentation

## 2013-01-10 DIAGNOSIS — D649 Anemia, unspecified: Secondary | ICD-10-CM | POA: Diagnosis not present

## 2013-01-10 DIAGNOSIS — Z880 Allergy status to penicillin: Secondary | ICD-10-CM | POA: Insufficient documentation

## 2013-01-10 DIAGNOSIS — M79609 Pain in unspecified limb: Secondary | ICD-10-CM | POA: Diagnosis not present

## 2013-01-10 DIAGNOSIS — Z8719 Personal history of other diseases of the digestive system: Secondary | ICD-10-CM | POA: Insufficient documentation

## 2013-01-10 DIAGNOSIS — J309 Allergic rhinitis, unspecified: Secondary | ICD-10-CM | POA: Diagnosis not present

## 2013-01-10 DIAGNOSIS — Z5189 Encounter for other specified aftercare: Secondary | ICD-10-CM | POA: Insufficient documentation

## 2013-01-10 DIAGNOSIS — Z888 Allergy status to other drugs, medicaments and biological substances status: Secondary | ICD-10-CM | POA: Diagnosis not present

## 2013-01-10 DIAGNOSIS — K219 Gastro-esophageal reflux disease without esophagitis: Secondary | ICD-10-CM | POA: Diagnosis not present

## 2013-01-10 DIAGNOSIS — Z853 Personal history of malignant neoplasm of breast: Secondary | ICD-10-CM | POA: Diagnosis not present

## 2013-01-10 DIAGNOSIS — S96911A Strain of unspecified muscle and tendon at ankle and foot level, right foot, initial encounter: Secondary | ICD-10-CM

## 2013-01-10 DIAGNOSIS — M25579 Pain in unspecified ankle and joints of unspecified foot: Secondary | ICD-10-CM | POA: Diagnosis present

## 2013-01-10 LAB — GLUCOSE, CAPILLARY: Glucose-Capillary: 182 mg/dL — ABNORMAL HIGH (ref 70–99)

## 2013-01-10 MED ORDER — HYDROCODONE-ACETAMINOPHEN 5-325 MG PO TABS: 1.0000 | ORAL_TABLET | ORAL | Status: DC | PRN

## 2013-01-10 NOTE — ED Notes (Signed)
Pt to ED c/o being unable to bear weight on R foot d/t pain.  Pain has been steadily increasing since Wed.  Denies injury.  No obvious deformities or discoloration.  States her small dog jumped on it on Tuesday, but that is the only possible mode of injury she can think of.

## 2013-01-10 NOTE — ED Notes (Signed)
CBG- 182 

## 2013-01-10 NOTE — ED Notes (Addendum)
Ortho paged and stated they would come down and apply ASO.

## 2013-01-10 NOTE — ED Notes (Signed)
Pt reports right foot/ankle pain since this past Wednesday. States only injury she can recall is her small dog stepping on her foot Tuesday. Right foot has swelling and bruising to lateral top of foot. Good pedal pulse palpated. Pt rates pain with movement 10/10 and no movement 3/10.

## 2013-01-10 NOTE — Progress Notes (Signed)
Orthopedic Tech Progress Note Patient Details:  Valerie Ball 12-29-1945 [id number]  Ortho Devices Type of Ortho Device: ASO Ortho Device/Splint Interventions: Application   Cammer, Mickie Bail 01/10/2013, 3:38 PM

## 2013-01-10 NOTE — ED Provider Notes (Signed)
CSN: [id number]     Arrival date & time 01/10/13  1309 History   First MD Initiated Contact with Patient 01/10/13 1406     Chief Complaint  Patient presents with  . Foot Pain   (Consider location/radiation/quality/duration/timing/severity/associated sxs/prior Treatment) Patient is a 67 y.o. female presenting with lower extremity pain. The history is provided by the patient. No language interpreter was used.  Foot Pain This is a new problem. The current episode started in the past 7 days. Pertinent negatives include no fever or numbness. Associated symptoms comments: Pain in right proximal forefoot dorsally at the joint with anterior ankle. No known injury. It has been progressive for the past 4 days and today she can put very little weight on it to walk. No swelling, discoloration. No ankle, calf or other pain..    Past Medical History  Diagnosis Date  . Allergy     allegic rhinitis  . Anxiety   . GERD (gastroesophageal reflux disease)   . Hypertension   . Hyperlipidemia   . Seizure disorder   . RLS (restless legs syndrome)   . Arthritis   . Diverticulosis   . GI bleed   . Cataract   . Salmonella 05/2000    renal effects secondary to dehydration  . Hiatal hernia   . Melena 01/2008    anemia transfusion  . Esophageal stricture 02/04/2008  . Borderline diabetic     per patient medical history form  . Menopause     per patient medical history form  . Postmenopausal hormone therapy     per patient medical history form.  . Blood transfusion   . Osteoporosis   . Colon stricture 01/14/2003  . Diabetes mellitus     pt's medical doctor took her off diabetic meds-blood sugars have not been a problem--and medications were causing pt's sugars to be too low  . Seizures     one seizure several yrs ago --etiology unknown-no problem since  . Anemia   . Breast cancer 02/2009    breast CA L invasive ductal CA(hormone receptor positive.)   Past Surgical History  Procedure Laterality  Date  . Abdominal hysterectomy  1983  . Ovary surgery  1986    ovarian tumor removed  . Mastectomy  04/2009    Bilateral for ductal carcinoma on L  . Breast surgery  02/2009    breast biopsy invasive ductal CA-bilateral mastectomies  . Epigastric hernia repair  01/02/2012    Procedure: HERNIA REPAIR EPIGASTRIC ADULT;  Surgeon: [name], MD;  Location: [facility];  Service: General;  Laterality: N/A;  Laparoscopic Repair Paraesophageal Hernia, Nissen  . Laparoscopic nissen fundoplication  01/02/2012    Procedure: LAPAROSCOPIC NISSEN FUNDOPLICATION;  Surgeon: [name], MD;  Location: [facility];  Service: General;  Laterality: N/A;   Family History  Problem Relation Age of Onset  . Heart attack Father   . Esophageal cancer Father   . Diabetes Father   . Cancer Father     esophageal  . Breast cancer Sister   . Diabetes Sister   . Cancer Sister     breast  . Hypertension Brother   . Diabetes Brother   . Breast cancer Sister   . Cancer Sister     breast  . Hypertension Mother   . Stroke Mother   . Breast cancer Mother   . Cancer Mother     breast  . Hypertension Sister    History  Substance Use Topics  .  Smoking status: Never Smoker   . Smokeless tobacco: Never Used  . Alcohol Use: No   OB History   Grav Para Term Preterm Abortions TAB SAB Ect Mult Living                 Review of Systems  Constitutional: Negative for fever.  Respiratory: Negative for shortness of breath.   Cardiovascular: Negative for leg swelling.  Musculoskeletal:       See HPI.  Skin: Negative for color change and wound.  Neurological: Negative for numbness.    Allergies  Arimidex; Metformin and related; and Penicillins  Home Medications   Current Outpatient Rx  Name  Route  Sig  Dispense  Refill  . alendronate (FOSAMAX) 70 MG tablet   Oral   Take 70 mg by mouth every 7 (seven) days. On Saturday   -  Take with a full glass of water on an empty stomach.         .  Cholecalciferol (VITAMIN D-3 PO)   Oral   Take 1,000 mg by mouth daily.          . enalapril (VASOTEC) 20 MG tablet   Oral   Take 1 tablet (20 mg total) by mouth at bedtime.   90 tablet   3   . ezetimibe (ZETIA) 10 MG tablet   Oral   Take 1 tablet (10 mg total) by mouth daily.   90 tablet   1   . fluticasone (FLONASE) 50 MCG/ACT nasal spray   Nasal   Place 2 sprays into the nose daily as needed. Allergies   16 g   3   . omeprazole (PRILOSEC) 20 MG capsule   Oral   Take 20 mg by mouth at bedtime.         . OXYCODONE HCL PO   Oral   Take 1 tablet by mouth once as needed (for pain).         Marland Kitchen rOPINIRole (REQUIP) 1 MG tablet   Oral   Take 1 tablet (1 mg total) by mouth at bedtime.   90 tablet   1   . sertraline (ZOLOFT) 100 MG tablet   Oral   Take 1 tablet (100 mg total) by mouth at bedtime.   90 tablet   3    BP 113/101  Pulse 89  Temp(Src) 98.4 F (36.9 C) (Oral)  Resp 16  Ht 5\' 4"  (1.626 m)  Wt 160 lb (72.576 kg)  BMI 27.45 kg/m2  SpO2 95%  LMP 03/20/1981 Physical Exam  Constitutional: She is oriented to person, place, and time. She appears well-developed and well-nourished.  Neck: Normal range of motion.  Pulmonary/Chest: Effort normal.  Musculoskeletal:  Right lower extremity unremarkable in appearance. No swelling, redness to calf, ankle or foot. Tender to palpation proximal dorsal forefoot. Full strength on range of motion.  Neurological: She is alert and oriented to person, place, and time.  Skin: Skin is warm and dry.  Psychiatric: She has a normal mood and affect.    ED Course  Procedures (including critical care time) Labs Review Labs Reviewed  GLUCOSE, CAPILLARY - Abnormal; Notable for the following:    Glucose-Capillary 182 (*)    All other components within normal limits  Dg Foot Complete Right  01/10/2013   CLINICAL DATA:  Foot pain and swelling for 2 days, no known injury  EXAM: RIGHT FOOT COMPLETE - 3+ VIEW  COMPARISON:   None.  FINDINGS: Three views of the right  foot submitted. No acute fracture or subluxation. No radiopaque foreign body.  IMPRESSION: No acute fracture or subluxation.   Electronically Signed   By: Natasha Mead M.D.   On: 01/10/2013 14:44   Imaging Review No results found.  EKG Interpretation   None       MDM  No diagnosis found. 1. Right foot pain  Negative x-ray and non-traumatic injury. No calf pain - doubt DVT as contributory. Worse with movement, weight bearing. Vascular exam intact. Exam and x-ray support foot strain/sprain injury.    Arnoldo Hooker, PA-C 01/14/13 [phone]

## 2013-01-15 NOTE — ED Provider Notes (Signed)
Medical screening examination/treatment/procedure(s) were conducted as a shared visit with non-physician practitioner(s) and myself.  I personally evaluated the patient during the encounter.  EKG Interpretation   None       Pt with pain to dorsum of foot.  No injury, no signs of infection.  No evidence of vascular compromise.  Likely musculoskeletal.  Rolan Bucco, MD 01/15/13 [phone]

## 2013-02-11 ENCOUNTER — Other Ambulatory Visit: Payer: Self-pay

## 2013-02-11 MED ORDER — ROPINIROLE HCL 1 MG PO TABS: 1.0000 mg | ORAL_TABLET | Freq: Every day | ORAL | Status: DC

## 2013-02-11 NOTE — Telephone Encounter (Signed)
That is fine -please refil # 30

## 2013-02-11 NOTE — Telephone Encounter (Signed)
Pt left note requesting requip refill # 30 to [facility] while waiting on rx from mail order.Please advise.

## 2013-02-11 NOTE — Telephone Encounter (Signed)
Pt left v/m requesting cb when requip is called in today to [facility].

## 2013-02-11 NOTE — Telephone Encounter (Signed)
Rx sent and pt's spouse was advise when he came up to office

## 2013-02-19 ENCOUNTER — Other Ambulatory Visit: Payer: Self-pay | Admitting: Emergency Medicine

## 2013-02-19 DIAGNOSIS — C50919 Malignant neoplasm of unspecified site of unspecified female breast: Secondary | ICD-10-CM

## 2013-02-20 ENCOUNTER — Encounter: Payer: Self-pay | Admitting: Oncology

## 2013-02-20 ENCOUNTER — Ambulatory Visit (HOSPITAL_BASED_OUTPATIENT_CLINIC_OR_DEPARTMENT_OTHER): Payer: 59 | Admitting: Oncology

## 2013-02-20 ENCOUNTER — Telehealth: Payer: Self-pay | Admitting: Oncology

## 2013-02-20 ENCOUNTER — Other Ambulatory Visit (HOSPITAL_BASED_OUTPATIENT_CLINIC_OR_DEPARTMENT_OTHER): Payer: 59 | Admitting: Lab

## 2013-02-20 VITALS — BP 167/96 | HR 93 | Temp 98.1°F | Resp 18 | Ht 64.0 in | Wt 165.6 lb

## 2013-02-20 DIAGNOSIS — Z170 Estrogen receptor positive status [ER+]: Secondary | ICD-10-CM

## 2013-02-20 DIAGNOSIS — C50919 Malignant neoplasm of unspecified site of unspecified female breast: Secondary | ICD-10-CM

## 2013-02-20 DIAGNOSIS — IMO0001 Reserved for inherently not codable concepts without codable children: Secondary | ICD-10-CM

## 2013-02-20 LAB — CBC WITH DIFFERENTIAL/PLATELET
BASO%: 0.2 % (ref 0.0–2.0)
EOS%: 1.3 % (ref 0.0–7.0)
Eosinophils Absolute: 0.1 10*3/uL (ref 0.0–0.5)
LYMPH%: 29.5 % (ref 14.0–49.7)
MCH: 27.2 pg (ref 25.1–34.0)
MCHC: 32.6 g/dL (ref 31.5–36.0)
MCV: 83.4 fL (ref 79.5–101.0)
MONO%: 7.2 % (ref 0.0–14.0)
NEUT#: 3.7 10*3/uL (ref 1.5–6.5)
Platelets: 142 10*3/uL — ABNORMAL LOW (ref 145–400)
RBC: 4.71 10*6/uL (ref 3.70–5.45)
RDW: 14.4 % (ref 11.2–14.5)
lymph#: 1.8 10*3/uL (ref 0.9–3.3)

## 2013-02-20 LAB — COMPREHENSIVE METABOLIC PANEL (CC13)
ALT: 45 U/L (ref 0–55)
AST: 59 U/L — ABNORMAL HIGH (ref 5–34)
Albumin: 4.3 g/dL (ref 3.5–5.0)
Alkaline Phosphatase: 90 U/L (ref 40–150)
Glucose: 115 mg/dl (ref 70–140)
Potassium: 4.1 mEq/L (ref 3.5–5.1)
Sodium: 144 mEq/L (ref 136–145)
Total Protein: 8.1 g/dL (ref 6.4–8.3)

## 2013-02-20 MED ORDER — TAMOXIFEN CITRATE 20 MG PO TABS: 20.0000 mg | ORAL_TABLET | Freq: Every day | ORAL | Status: DC

## 2013-02-20 NOTE — Telephone Encounter (Signed)
, °

## 2013-02-20 NOTE — Progress Notes (Signed)
OFFICE PROGRESS NOTE  CC  Valerie Manns, MD [address] [place] [address] [place] [address] Dr. Harriette Bouillon  DIAGNOSIS: 67 year old female with stage I invasive ductal carcinoma of the left breast patient is status post bilateral mastectomies in February 2011.  PRIOR THERAPY:  #1 patient was diagnosed with invasive ductal carcinoma of the left breast.  #2 she went on to have therapeutic left mastectomy with prophylactic right mastectomy for 8 ER positive PR positive HER-2/neu negative invasive ductal carcinoma stage I in February 2011.  #3 she had immediate reconstruction performed.  #4 patient has been on Arimidex 1 mg daily However this had to be discontinued about a month ago do to aches and pains.  #4 patient was to begin Aromasin unfortunately she did not. She did try Arimidex again and developed significant aches and pains and discontinued it. Patient did not call us regarding prescription for the Aromasin either. I have therefore not recommended that we proceed with tamoxifen 20 mg daily instead. Hopefully this will not interfere with causing her aches and pains. Discussed side effects risks benefits. This is curative intent and for 5 years.  CURRENT THERAPY: Tamoxifen 20 mg daily  INTERVAL HISTORY: Valerie Ball 67 y.o. female returns for Followup visit today. Overall she's doing well she is just fatigue. She tells me that her husband was recently diagnosed with bipolar disorder. She has no nausea vomiting fevers chills night sweats no headaches double vision or blurring of vision. Remainder of the 10 point review of systems is negative.  MEDICAL HISTORY: Past Medical History  Diagnosis Date  . Allergy     allegic rhinitis  . Anxiety   . GERD (gastroesophageal reflux disease)   . Hypertension   . Hyperlipidemia   . Seizure disorder   . RLS (restless legs syndrome)   . Arthritis   . Diverticulosis   . GI bleed   . Cataract   . Salmonella  05/2000    renal effects secondary to dehydration  . Hiatal hernia   . Melena 01/2008    anemia transfusion  . Esophageal stricture 02/04/2008  . Borderline diabetic     per patient medical history form  . Menopause     per patient medical history form  . Postmenopausal hormone therapy     per patient medical history form.  . Blood transfusion   . Osteoporosis   . Colon stricture 01/14/2003  . Diabetes mellitus     pt's medical doctor took her off diabetic meds-blood sugars have not been a problem--and medications were causing pt's sugars to be too low  . Seizures     one seizure several yrs ago --etiology unknown-no problem since  . Anemia   . Breast cancer 02/2009    breast CA L invasive ductal CA(hormone receptor positive.)    ALLERGIES:  is allergic to arimidex; metformin and related; and penicillins.  MEDICATIONS:  Current Outpatient Prescriptions  Medication Sig Dispense Refill  . alendronate (FOSAMAX) 70 MG tablet Take 70 mg by mouth every 7 (seven) days. On Saturday   -  Take with a full glass of water on an empty stomach.      . Cholecalciferol (VITAMIN D-3 PO) Take 1,000 mg by mouth daily.       . enalapril (VASOTEC) 20 MG tablet Take 1 tablet (20 mg total) by mouth at bedtime.  90 tablet  3  . ezetimibe (ZETIA) 10 MG tablet Take 1 tablet (10 mg total) by  mouth daily.  90 tablet  1  . fluticasone (FLONASE) 50 MCG/ACT nasal spray Place 2 sprays into the nose daily as needed. Allergies  16 g  3  . omeprazole (PRILOSEC) 20 MG capsule Take 20 mg by mouth at bedtime.      Marland Kitchen rOPINIRole (REQUIP) 1 MG tablet Take 1 tablet (1 mg total) by mouth at bedtime.  30 tablet  0  . sertraline (ZOLOFT) 100 MG tablet Take 1 tablet (100 mg total) by mouth at bedtime.  90 tablet  3  . [DISCONTINUED] cetirizine (ZYRTEC) 10 MG tablet Take 10 mg by mouth daily as needed. For allergy symptoms      . [DISCONTINUED] hydrochlorothiazide 25 MG tablet Take 25 mg by mouth daily.         No current  facility-administered medications for this visit.    SURGICAL HISTORY:  Past Surgical History  Procedure Laterality Date  . Abdominal hysterectomy  1983  . Ovary surgery  1986    ovarian tumor removed  . Mastectomy  04/2009    Bilateral for ductal carcinoma on L  . Breast surgery  02/2009    breast biopsy invasive ductal CA-bilateral mastectomies  . Epigastric hernia repair  01/02/2012    Procedure: HERNIA REPAIR EPIGASTRIC ADULT;  Surgeon: [name], MD;  Location: [facility];  Service: General;  Laterality: N/A;  Laparoscopic Repair Paraesophageal Hernia, Nissen  . Laparoscopic nissen fundoplication  01/02/2012    Procedure: LAPAROSCOPIC NISSEN FUNDOPLICATION;  Surgeon: [name], MD;  Location: [facility];  Service: General;  Laterality: N/A;    REVIEW OF SYSTEMS:  Pertinent items are noted in HPI.   PHYSICAL EXAMINATION: General appearance: alert, cooperative and appears stated age Lymph nodes: Cervical, supraclavicular, and axillary nodes normal. Resp: clear to auscultation bilaterally Back: symmetric, no curvature. ROM normal. No CVA tenderness. Cardio: regular rate and rhythm, S1, S2 normal, no murmur, click, rub or gallop GI: soft, non-tender; bowel sounds normal; no masses,  no organomegaly Extremities: extremities normal, atraumatic, no cyanosis or edema Neurologic: Grossly normal  ECOG PERFORMANCE STATUS: 1 - Symptomatic but completely ambulatory  Blood pressure 167/96, pulse 93, temperature 98.1 F (36.7 C), temperature source Oral, resp. rate 18, height 5\' 4"  (1.626 m), weight 165 lb 9.6 oz (75.116 kg), last menstrual period 03/20/1981.  LABORATORY DATA: Lab Results  Component Value Date   WBC 6.0 02/20/2013   HGB 12.8 02/20/2013   HCT 39.3 02/20/2013   MCV 83.4 02/20/2013   PLT 142* 02/20/2013      Chemistry      Component Value Date/Time   NA 144 02/20/2013 1030   NA 142 11/29/2012 0842   NA 135 10/14/2009 1409   K 4.1 02/20/2013 1030   K 4.2 11/29/2012  0842   K 3.7 10/14/2009 1409   CL 105 11/29/2012 0842   CL 103 08/21/2012 1144   CL 96* 10/14/2009 1409   CO2 25 02/20/2013 1030   CO2 30 11/29/2012 0842   CO2 28 10/14/2009 1409   BUN 23.2 02/20/2013 1030   BUN 14 11/29/2012 0842   BUN 16 10/14/2009 1409   CREATININE 0.9 02/20/2013 1030   CREATININE 0.9 11/29/2012 0842   CREATININE 0.8 10/14/2009 1409      Component Value Date/Time   CALCIUM 9.8 02/20/2013 1030   CALCIUM 9.3 11/29/2012 0842   CALCIUM 9.2 10/14/2009 1409   ALKPHOS 90 02/20/2013 1030   ALKPHOS 64 11/29/2012 0842   ALKPHOS 75 10/14/2009 1409  AST 59* 02/20/2013 1030   AST 42* 11/29/2012 0842   AST 98* 10/14/2009 1409   ALT 45 02/20/2013 1030   ALT 35 11/29/2012 0842   ALT 67* 10/14/2009 1409   BILITOT 0.43 02/20/2013 1030   BILITOT 0.6 11/29/2012 0842   BILITOT 0.50 10/14/2009 1409       RADIOGRAPHIC STUDIES:  Ct Virtual Colonoscopy Screening  09/20/2011  *RADIOLOGY REPORT*  Clinical Data: Incomplete colonoscopy 5 years ago.  Now with diarrhea and constipation.  Abdominal pain and nausea.  History of breast cancer.  CT VIRTUAL COLONOSCOPY FOR SCREENING Technique:   The patient was given a standard LoSo bowel preparation with Gastrografin and barium for fluid and stool tagging respectively.  The quality of the bowel preparation is excellent.  Automated CO2 insufflation of the colon was performed prior to image acquisition and colonic distention is moderate. Image post processing was used to generate a 3D endoluminal fly- through projection of the colon and to electronically subtract stool/fluid as appropriate.  Comparison:   None  Findings:  The sigmoid colon is affected by diverticular change with hypertrophic muscular wall thickening.  This results in inadequate distention of the proximal portion of the redundant sigmoid colon. The cecum, ascending colon, hepatic flexure, transverse colon, splenic flexure, and descending colon are well distended and clearly visualized.  5 - 6 mm tiny sessile  polyp is identified in the cecum.  Otherwise no clinically significant colonic polyp or mass is identified in the colon.  Advanced diverticular changes are seen in the sigmoid colon.  No evidence for an overt colonic stricture.  IMPRESSION: 5-6 mm tiny sessile polyp in the cecum.   C-RADS category C-0 exam secondary to incomplete/inadequate visualization of the sigmoid colon.  This area could likely be visualized by flexible sigmoidoscopy.  Remaining segments of the colon are well evaluated.  Virtual colonoscopy is not designed to detect diminutive polyps (i.e., less than or equal to 5 mm), the presence or absence of which may not affect clinical management.  CT ABDOMEN AND PELVIS WITHOUT CONTRAST  Findings:  Moderate-to-large hiatal hernia noted with approximately 50% of the stomach in the chest.  No focal abnormality is seen within the liver or spleen on this study performed without intravenous contrast material.  Duodenum, pancreas, gallbladder, adrenal glands, and kidneys are unremarkable.  Imaging through the pelvis shows no free intraperitoneal fluid. There is no pelvic sidewall lymphadenopathy.  Uterus is surgically absent.  No adnexal mass.  IMPRESSION:  Unremarkable uninfused CT scan of the abdomen and pelvis.  Original Report Authenticated By: ERIC A. MANSELL, M.D.   Dg Abd 2 Views  09/04/2011  *RADIOLOGY REPORT*  Clinical Data: Epigastric pain.  ABDOMEN - 2 VIEW  Comparison: CT 03/25/2011  Findings: Moderate stool burden throughout the colon. There is normal bowel gas pattern.  No free air.  No organomegaly or suspicious calcification.  No acute bony abnormality.  IMPRESSION: Moderate stool burden.  No acute findings.  Original Report Authenticated By: Cyndie Chime, M.D.    ASSESSMENT: 67 year old female with  #1 stage I invasive ductal carcinoma of the left breast status post bilateral mastectomies for a ER positive cancer HER-2/neu negative. Patient subsequently underwent immediate  reconstruction bilaterally.  #2 patient now with myalgias and arthralgias likely secondary to her adjuvant antiestrogen Arimidex.we had held the Arimidex for a month. Her aches and pains are slightly better but not significantly improved. She thinks that it is not the best cancer medicine that is causing her problems.  #  3 I have recommended we begin tamoxifen 20 mg daily to see if we can minimize her myalgias and arthralgias. We discussed the risks and benefits and side effects.   PLAN:  #1 proceed with tamoxifen 20 mg daily. Prescription was sent to her pharmacy #[facility] with 6 refills.  #2 he'll see the patient back in 6 months time for followup.  All questions were answered. The patient knows to call the clinic with any problems, questions or concerns. We can certainly see the patient much sooner if necessary.  I spent 25 minutes counseling the patient face to face. The total time spent in the appointment was 30 minutes.    Drue Second, MD Medical/Oncology [facility] [phone] (beeper) [phone] (Office)

## 2013-02-20 NOTE — Patient Instructions (Signed)

## 2013-05-19 ENCOUNTER — Encounter (INDEPENDENT_AMBULATORY_CARE_PROVIDER_SITE_OTHER): Payer: Self-pay | Admitting: Surgery

## 2013-05-19 ENCOUNTER — Ambulatory Visit (INDEPENDENT_AMBULATORY_CARE_PROVIDER_SITE_OTHER): Payer: Medicare Other | Admitting: Surgery

## 2013-05-19 VITALS — BP 130/80 | HR 74 | Temp 98.8°F | Resp 18 | Ht 64.0 in | Wt 168.0 lb

## 2013-05-19 DIAGNOSIS — Z853 Personal history of malignant neoplasm of breast: Secondary | ICD-10-CM

## 2013-05-19 NOTE — Patient Instructions (Signed)
WILL REFER TO PLASTICS FOR IMPLANT EVALUATION. RETURN 1 YEAR.

## 2013-05-19 NOTE — Progress Notes (Signed)
NAME: Valerie Ball       DOB: 07/21/1945           DATE: 05/19/2013       MRN: [id number]   Valerie Ball is a 68 y.o.Valerie Kitchenfemale who presents for routine followup of her Left breast cancer stage 1diagnosed in 04/2009 and treated with bilateral mastectomy and reconstruction.. She has no problems or concerns on either side. Some seasonal allergies.  Doesn't like her implants.Has pain especially with right sided implant.   PFSH: She has had no significant changes since the last visit here.  ROS: There have been no significant changes since the last visit here  EXAM: General: The patient is alert, oriented, generally healty appearing, NAD. Mood and affect are normal.  Breasts:  implants in place.  No masses bilaterally   Lymphatics: She has no axillary or supraclavicular adenopathy on either side.  Extremities: Full ROM of the surgical side with no lymphedema noted.  Data Reviewed: Dr Humphrey Rolls note  Impression: Doing well, with no evidence of recurrent cancer or new cancer.  Pt does not  like her implants and wants them out. Plan: Will continue to follow up on an annual basis here. Refer to plastics about implant removal.

## 2013-05-22 ENCOUNTER — Telehealth (INDEPENDENT_AMBULATORY_CARE_PROVIDER_SITE_OTHER): Payer: Self-pay | Admitting: *Deleted

## 2013-05-22 NOTE — Telephone Encounter (Signed)
I spoke with pt and informed her of the appt for her to see Dr. Migdalia Dk on 05/27/13 at 10:30am.  I informed her of Dr. Leafy Ro address and phone number.  She is agreeable at this time.

## 2013-05-27 ENCOUNTER — Other Ambulatory Visit: Payer: Self-pay

## 2013-05-27 DIAGNOSIS — T8549XA Other mechanical complication of breast prosthesis and implant, initial encounter: Secondary | ICD-10-CM | POA: Diagnosis not present

## 2013-05-27 DIAGNOSIS — T8544XA Capsular contracture of breast implant, initial encounter: Secondary | ICD-10-CM | POA: Insufficient documentation

## 2013-05-27 NOTE — Telephone Encounter (Signed)
Pt left note; has changed ins co and wants refill of sertraline,ropinirole,enalapril,zetia and omeprazole to [facility]; at 12/06/12 office visit pt was to return in 6 months for /fu appt with labs prior; do not see future appt.Please advise.

## 2013-05-27 NOTE — Telephone Encounter (Signed)
Please schedule f/u and refill meds for 6 mo  thanks

## 2013-05-28 MED ORDER — ROPINIROLE HCL 1 MG PO TABS: 1.0000 mg | ORAL_TABLET | Freq: Every day | ORAL | Status: DC

## 2013-05-28 MED ORDER — SERTRALINE HCL 100 MG PO TABS: 100.0000 mg | ORAL_TABLET | Freq: Every day | ORAL | Status: DC

## 2013-05-28 MED ORDER — EZETIMIBE 10 MG PO TABS: 10.0000 mg | ORAL_TABLET | Freq: Every day | ORAL | Status: DC

## 2013-05-28 MED ORDER — ENALAPRIL MALEATE 20 MG PO TABS: 20.0000 mg | ORAL_TABLET | Freq: Every day | ORAL | Status: DC

## 2013-05-28 MED ORDER — OMEPRAZOLE 20 MG PO CPDR: 20.0000 mg | DELAYED_RELEASE_CAPSULE | Freq: Every day | ORAL | Status: DC

## 2013-05-28 NOTE — Telephone Encounter (Signed)
appt scheduled and meds refill x6 months

## 2013-06-25 ENCOUNTER — Other Ambulatory Visit: Payer: Self-pay

## 2013-06-25 DIAGNOSIS — L905 Scar conditions and fibrosis of skin: Secondary | ICD-10-CM | POA: Diagnosis not present

## 2013-06-25 DIAGNOSIS — T8544XA Capsular contracture of breast implant, initial encounter: Secondary | ICD-10-CM | POA: Diagnosis not present

## 2013-06-25 DIAGNOSIS — Z853 Personal history of malignant neoplasm of breast: Secondary | ICD-10-CM | POA: Diagnosis not present

## 2013-06-25 DIAGNOSIS — T8549XA Other mechanical complication of breast prosthesis and implant, initial encounter: Secondary | ICD-10-CM | POA: Diagnosis not present

## 2013-07-01 ENCOUNTER — Encounter: Payer: Self-pay | Admitting: Family Medicine

## 2013-07-01 ENCOUNTER — Ambulatory Visit (INDEPENDENT_AMBULATORY_CARE_PROVIDER_SITE_OTHER): Payer: Medicare Other | Admitting: Family Medicine

## 2013-07-01 VITALS — BP 140/82 | HR 91 | Temp 98.5°F | Ht 64.0 in | Wt 165.8 lb

## 2013-07-01 DIAGNOSIS — R7309 Other abnormal glucose: Secondary | ICD-10-CM

## 2013-07-01 DIAGNOSIS — K7689 Other specified diseases of liver: Secondary | ICD-10-CM | POA: Diagnosis not present

## 2013-07-01 DIAGNOSIS — I10 Essential (primary) hypertension: Secondary | ICD-10-CM | POA: Diagnosis not present

## 2013-07-01 DIAGNOSIS — R739 Hyperglycemia, unspecified: Secondary | ICD-10-CM

## 2013-07-01 DIAGNOSIS — Z9886 Personal history of breast implant removal: Secondary | ICD-10-CM

## 2013-07-01 HISTORY — DX: Personal history of breast implant removal: Z98.86

## 2013-07-01 LAB — ALT: ALT: 34 U/L (ref 0–35)

## 2013-07-01 LAB — HEMOGLOBIN A1C: HEMOGLOBIN A1C: 6.7 % — AB (ref 4.6–6.5)

## 2013-07-01 LAB — AST: AST: 57 U/L — AB (ref 0–37)

## 2013-07-01 NOTE — Progress Notes (Signed)
Subjective:    Patient ID: Valerie Ball, female    DOB: [birth year], 68 y.o.   MRN: [id number]  HPI Here for f/u of chronic health problems  Just had surgery to remove her breast implants - and did very well  Glad she did it  Feels better without them - much more comfortable  Has appt with surgeon this afternoon   Lot of stress Not sleeping well at night  Her husband is having a hard time with Bipolar right now - and it is affecting her a lot   bp is stable today  No cp or palpitations or headaches or edema  No side effects to medicines  BP Readings from Last 3 Encounters:  07/01/13 140/82  05/19/13 130/80  02/20/13 167/96    Did not miss medication Is stressed and just had surgery   She is trying hard to care for herself  Is is hard to do so - with her husband the way he is  She has a difficult to function when husband won't get off the couch     Hx of hyperglycemia Lab Results  Component Value Date   HGBA1C 6.7* 11/29/2012   she thinks she has done well overall with sugar intake Cheated once with a pie -otherwise does well  Does eat out too much  Some exercise - has a new dog who needs to be walked  Will be more active once cleared by her surgeon   Hx of fatty liver  Lab Results  Component Value Date   ALT 45 02/20/2013   AST 59* 02/20/2013   ALKPHOS 90 02/20/2013   BILITOT 0.43 02/20/2013  overall stable   Wt is down 3 lb with bmi of 28  Patient Active Problem List   Diagnosis Date Noted  . History of breast cancer 06/07/2012  . Lap Nissen October 2013 01/19/2012  . Large type III mixed hiatus hernia  12/06/2011  . Stress reaction 10/25/2011  . Routine general medical examination at a health care facility 07/11/2011  . ADVERSE DRUG REACTION 05/05/2010  . TRANSAMINASES, SERUM, ELEVATED 11/11/2009  . BREAST CANCER 08/02/2009  . DIVERTICULOSIS-COLON 03/03/2008  . ESOPHAGEAL STRICTURE 03/02/2008  . HIATAL HERNIA 03/02/2008  . PERITONEAL ADHESIONS  03/02/2008  . UNSPECIFIED ANEMIA 02/07/2008  . Hyperglycemia 08/19/2007  . ANXIETY 04/15/2007  . DISORDERS, ORGANIC SLEEP NEC 08/23/2006  . SYNDROME, RESTLESS LEGS 08/23/2006  . SYNDROME, CARPAL TUNNEL 08/23/2006  . HYPERLIPIDEMIA 08/22/2006  . HYPERTENSION 08/22/2006  . ALLERGIC RHINITIS 08/22/2006  . GERD 08/22/2006  . FATTY LIVER DISEASE 08/22/2006  . SEIZURE DISORDER 08/22/2006   Past Medical History  Diagnosis Date  . Allergy     allegic rhinitis  . Anxiety   . GERD (gastroesophageal reflux disease)   . Hypertension   . Hyperlipidemia   . Seizure disorder   . RLS (restless legs syndrome)   . Arthritis   . Diverticulosis   . GI bleed   . Cataract   . Salmonella 05/2000    renal effects secondary to dehydration  . Hiatal hernia   . Melena 01/2008    anemia transfusion  . Esophageal stricture 02/04/2008  . Borderline diabetic     per patient medical history form  . Menopause     per patient medical history form  . Postmenopausal hormone therapy     per patient medical history form.  . Blood transfusion   . Osteoporosis   . Colon stricture 01/14/2003  . Diabetes mellitus  pt's medical doctor took her off diabetic meds-blood sugars have not been a problem--and medications were causing pt's sugars to be too low  . Seizures     one seizure several yrs ago --etiology unknown-no problem since  . Anemia   . Breast cancer 02/2009    breast CA L invasive ductal CA(hormone receptor positive.)   Past Surgical History  Procedure Laterality Date  . Abdominal hysterectomy  1983  . Ovary surgery  1986    ovarian tumor removed  . Mastectomy  04/2009    Bilateral for ductal carcinoma on L  . Breast surgery  02/2009    breast biopsy invasive ductal CA-bilateral mastectomies  . Epigastric hernia repair  01/02/2012    Procedure: HERNIA REPAIR EPIGASTRIC ADULT;  Surgeon: Pedro Earls, MD;  Location: [facility];  Service: General;  Laterality: N/A;  Laparoscopic Repair  Paraesophageal Hernia, Nissen  . Laparoscopic nissen fundoplication  09/47/0962    Procedure: LAPAROSCOPIC NISSEN FUNDOPLICATION;  Surgeon: Pedro Earls, MD;  Location: [facility];  Service: General;  Laterality: N/A;   History  Substance Use Topics  . Smoking status: Never Smoker   . Smokeless tobacco: Never Used  . Alcohol Use: No   Family History  Problem Relation Age of Onset  . Heart attack Father   . Esophageal cancer Father   . Diabetes Father   . Cancer Father     esophageal  . Breast cancer Sister   . Diabetes Sister   . Cancer Sister     breast  . Hypertension Brother   . Diabetes Brother   . Breast cancer Sister   . Cancer Sister     breast  . Hypertension Mother   . Stroke Mother   . Breast cancer Mother   . Cancer Mother     breast  . Hypertension Sister    Allergies  Allergen Reactions  . Arimidex [Anastrozole]     Joint pain  . Metformin And Related     diarrhea  . Penicillins Rash   Current Outpatient Prescriptions on File Prior to Visit  Medication Sig Dispense Refill  . alendronate (FOSAMAX) 70 MG tablet Take 70 mg by mouth every 7 (seven) days. On Saturday   -  Take with a full glass of water on an empty stomach.      . Cholecalciferol (VITAMIN D-3 PO) Take 1,000 mg by mouth daily.       . enalapril (VASOTEC) 20 MG tablet Take 1 tablet (20 mg total) by mouth at bedtime.  90 tablet  1  . ezetimibe (ZETIA) 10 MG tablet Take 1 tablet (10 mg total) by mouth daily.  90 tablet  1  . fluticasone (FLONASE) 50 MCG/ACT nasal spray Place 2 sprays into the nose daily as needed. Allergies  16 g  3  . omeprazole (PRILOSEC) 20 MG capsule Take 1 capsule (20 mg total) by mouth at bedtime.  90 capsule  1  . rOPINIRole (REQUIP) 1 MG tablet Take 1 tablet (1 mg total) by mouth at bedtime.  90 tablet  1  . sertraline (ZOLOFT) 100 MG tablet Take 1 tablet (100 mg total) by mouth at bedtime.  90 tablet  1  . tamoxifen (NOLVADEX) 20 MG tablet Take 1 tablet (20 mg total) by  mouth daily.  30 tablet  6  . [DISCONTINUED] cetirizine (ZYRTEC) 10 MG tablet Take 10 mg by mouth daily as needed. For allergy symptoms      . [DISCONTINUED] hydrochlorothiazide 25  MG tablet Take 25 mg by mouth daily.         No current facility-administered medications on file prior to visit.      Review of Systems    Review of Systems  Constitutional: Negative for fever, appetite change, fatigue and unexpected weight change.  Eyes: Negative for pain and visual disturbance.  Respiratory: Negative for cough and shortness of breath.   Cardiovascular: Negative for cp or palpitations    Gastrointestinal: Negative for nausea, diarrhea and constipation.  Genitourinary: Negative for urgency and frequency.  Skin: Negative for pallor or rash   Neurological: Negative for weakness, light-headedness, numbness and headaches.  Hematological: Negative for adenopathy. Does not bruise/bleed easily.  Psychiatric/Behavioral: Negative for dysphoric mood. The patient is not nervous/anxious.  pos for stressors     Objective:   Physical Exam  Constitutional: She appears well-developed and well-nourished. No distress.  HENT:  Head: Normocephalic and atraumatic.  Mouth/Throat: Oropharynx is clear and moist.  Eyes: Conjunctivae and EOM are normal. Pupils are equal, round, and reactive to light. No scleral icterus.  Neck: Normal range of motion. Neck supple. No JVD present. Carotid bruit is not present. No thyromegaly present.  Cardiovascular: Normal rate and regular rhythm.   Pulmonary/Chest: Effort normal and breath sounds normal. No respiratory distress. She has no wheezes. She has no rales.  Pt still has drains in place from recent surgery  Musculoskeletal: She exhibits no edema.  Lymphadenopathy:    She has no cervical adenopathy.  Neurological: She is alert. She has normal reflexes. No cranial nerve deficit. She exhibits normal muscle tone. Coordination normal.  Skin: Skin is warm and dry. No rash  noted. No erythema. No pallor.  Psychiatric: She has a normal mood and affect.          Assessment & Plan:

## 2013-07-01 NOTE — Progress Notes (Signed)
Pre visit review using our clinic review tool, if applicable. No additional management support is needed unless otherwise documented below in the visit note. 

## 2013-07-01 NOTE — Patient Instructions (Signed)
Work on [employer] and exercise when you can and do some things when you can Labs today  Please limit sugar and carbs in diet  Follow up in 6 months for an annual exam with labs prior

## 2013-07-02 ENCOUNTER — Telehealth: Payer: Self-pay | Admitting: Family Medicine

## 2013-07-02 NOTE — Telephone Encounter (Signed)
Relevant patient education assigned to patient using Emmi. ° °

## 2013-07-03 NOTE — Assessment & Plan Note (Signed)
Stable lfts Enc wt loss with healthy habits  Will continue to follow

## 2013-07-03 NOTE — Assessment & Plan Note (Signed)
bp in fair control at this time  BP Readings from Last 1 Encounters:  07/01/13 140/82   No changes needed Disc lifstyle change with low sodium diet and exercise  (when cleared) F/u 6 mo

## 2013-07-03 NOTE — Assessment & Plan Note (Signed)
Lab Results  Component Value Date   HGBA1C 6.7* 07/01/2013    Disc need to reduce this in the face of new DM Rev low glycemic diet To start exercise when cleared  Wt loss  Re check 3-6 mo

## 2013-08-29 ENCOUNTER — Telehealth: Payer: Self-pay | Admitting: Hematology and Oncology

## 2013-08-29 NOTE — Telephone Encounter (Signed)
, °

## 2013-09-05 ENCOUNTER — Ambulatory Visit: Payer: 59 | Admitting: Oncology

## 2013-09-05 ENCOUNTER — Other Ambulatory Visit: Payer: 59

## 2013-09-27 ENCOUNTER — Other Ambulatory Visit: Payer: Self-pay | Admitting: Oncology

## 2013-09-27 DIAGNOSIS — D0592 Unspecified type of carcinoma in situ of left breast: Secondary | ICD-10-CM

## 2013-09-29 ENCOUNTER — Telehealth: Payer: Self-pay

## 2013-09-29 NOTE — Telephone Encounter (Signed)
Charting error.

## 2013-10-09 ENCOUNTER — Telehealth: Payer: Self-pay

## 2013-10-09 NOTE — Telephone Encounter (Signed)
Pt left v/m; pt has bilateral mastectomy and for medicare to pay for bra and prosthesis pt request rx written for bilateral prosthesis and bra .Please advise.

## 2013-10-10 NOTE — Telephone Encounter (Signed)
Left voicemail letting pt know order is ready for pick-up

## 2013-10-10 NOTE — Telephone Encounter (Signed)
Px is in IN box  

## 2013-11-11 ENCOUNTER — Other Ambulatory Visit: Payer: Self-pay | Admitting: Family Medicine

## 2013-11-12 NOTE — Telephone Encounter (Signed)
Please refill for 6 mo 

## 2013-11-12 NOTE — Telephone Encounter (Signed)
Electronic refill request, please advise  

## 2013-11-12 NOTE — Telephone Encounter (Signed)
done

## 2013-11-21 ENCOUNTER — Other Ambulatory Visit: Payer: Self-pay

## 2013-11-21 ENCOUNTER — Telehealth: Payer: Self-pay | Admitting: Hematology and Oncology

## 2013-11-21 DIAGNOSIS — C50919 Malignant neoplasm of unspecified site of unspecified female breast: Secondary | ICD-10-CM

## 2013-11-21 NOTE — Telephone Encounter (Signed)
cld & left pt message for r/s appt for 9/8

## 2013-11-25 ENCOUNTER — Other Ambulatory Visit (HOSPITAL_BASED_OUTPATIENT_CLINIC_OR_DEPARTMENT_OTHER): Payer: Medicare Other

## 2013-11-25 ENCOUNTER — Ambulatory Visit (HOSPITAL_BASED_OUTPATIENT_CLINIC_OR_DEPARTMENT_OTHER): Payer: Medicare Other | Admitting: Hematology and Oncology

## 2013-11-25 ENCOUNTER — Encounter: Payer: Self-pay | Admitting: Hematology and Oncology

## 2013-11-25 VITALS — BP 157/87 | HR 91 | Temp 98.5°F | Resp 18 | Ht 64.0 in | Wt 168.6 lb

## 2013-11-25 DIAGNOSIS — E1142 Type 2 diabetes mellitus with diabetic polyneuropathy: Secondary | ICD-10-CM

## 2013-11-25 DIAGNOSIS — Z170 Estrogen receptor positive status [ER+]: Secondary | ICD-10-CM | POA: Diagnosis not present

## 2013-11-25 DIAGNOSIS — Z23 Encounter for immunization: Secondary | ICD-10-CM | POA: Diagnosis not present

## 2013-11-25 DIAGNOSIS — E1140 Type 2 diabetes mellitus with diabetic neuropathy, unspecified: Secondary | ICD-10-CM | POA: Insufficient documentation

## 2013-11-25 DIAGNOSIS — D696 Thrombocytopenia, unspecified: Secondary | ICD-10-CM

## 2013-11-25 DIAGNOSIS — C50919 Malignant neoplasm of unspecified site of unspecified female breast: Secondary | ICD-10-CM | POA: Diagnosis not present

## 2013-11-25 DIAGNOSIS — E1149 Type 2 diabetes mellitus with other diabetic neurological complication: Secondary | ICD-10-CM

## 2013-11-25 HISTORY — DX: Type 2 diabetes mellitus with diabetic neuropathy, unspecified: E11.40

## 2013-11-25 HISTORY — DX: Thrombocytopenia, unspecified: D69.6

## 2013-11-25 LAB — CBC WITH DIFFERENTIAL/PLATELET
BASO%: 0.4 % (ref 0.0–2.0)
Basophils Absolute: 0 10*3/uL (ref 0.0–0.1)
EOS%: 1.5 % (ref 0.0–7.0)
Eosinophils Absolute: 0.1 10*3/uL (ref 0.0–0.5)
HCT: 37.1 % (ref 34.8–46.6)
HGB: 11.9 g/dL (ref 11.6–15.9)
LYMPH#: 1.5 10*3/uL (ref 0.9–3.3)
LYMPH%: 26.2 % (ref 14.0–49.7)
MCH: 27.9 pg (ref 25.1–34.0)
MCHC: 32 g/dL (ref 31.5–36.0)
MCV: 87.1 fL (ref 79.5–101.0)
MONO#: 0.4 10*3/uL (ref 0.1–0.9)
MONO%: 7.6 % (ref 0.0–14.0)
NEUT#: 3.6 10*3/uL (ref 1.5–6.5)
NEUT%: 64.3 % (ref 38.4–76.8)
Platelets: 117 10*3/uL — ABNORMAL LOW (ref 145–400)
RBC: 4.26 10*6/uL (ref 3.70–5.45)
RDW: 15.2 % — ABNORMAL HIGH (ref 11.2–14.5)
WBC: 5.6 10*3/uL (ref 3.9–10.3)

## 2013-11-25 LAB — COMPREHENSIVE METABOLIC PANEL (CC13)
ALT: 41 U/L (ref 0–55)
ANION GAP: 11 meq/L (ref 3–11)
AST: 63 U/L — AB (ref 5–34)
Albumin: 3.5 g/dL (ref 3.5–5.0)
Alkaline Phosphatase: 103 U/L (ref 40–150)
BILIRUBIN TOTAL: 0.23 mg/dL (ref 0.20–1.20)
BUN: 13.3 mg/dL (ref 7.0–26.0)
CHLORIDE: 108 meq/L (ref 98–109)
CO2: 24 meq/L (ref 22–29)
CREATININE: 0.8 mg/dL (ref 0.6–1.1)
Calcium: 8.9 mg/dL (ref 8.4–10.4)
Glucose: 202 mg/dl — ABNORMAL HIGH (ref 70–140)
Potassium: 4.7 mEq/L (ref 3.5–5.1)
SODIUM: 143 meq/L (ref 136–145)
TOTAL PROTEIN: 7.2 g/dL (ref 6.4–8.3)

## 2013-11-25 MED ORDER — INFLUENZA VAC SPLIT QUAD 0.5 ML IM SUSY
0.5000 mL | PREFILLED_SYRINGE | Freq: Once | INTRAMUSCULAR | Status: AC
  Administered 2013-11-25: 0.5 mL via INTRAMUSCULAR
  Filled 2013-11-25: qty 0.5

## 2013-11-25 NOTE — Assessment & Plan Note (Signed)
Patient had long-standing intermittent mild thrombocytopenia with platelets ranging from 110 250. She has not had any excessive bruising or bleeding. Recently she had Nissen fundoplication surgery. And did not have any excessive bleeding. Solu continue to watch and monitor. It could be a low-grade immune thrombocytopenia.

## 2013-11-25 NOTE — Assessment & Plan Note (Signed)
Right breast IDC T1 C. N0 M0 stage IA status post bilateral mastectomies with immediate reconstruction in 2011 followed by antiestrogen therapy initially with aromatase inhibitors but because of intolerance she was switched to tamoxifen. She was tolerating tamoxifen much better and plans to complete 5 years of therapy by April 2016. Monitoring for tamoxifen-related toxicities. Patient has had hysterectomy so she does not need Pap smears. Because of bilateral mastectomies she does not need any other imaging studies.

## 2013-11-25 NOTE — Assessment & Plan Note (Signed)
Patient has had neuropathy getting worse over the past 2-3 months. She reports that this is most likely related to her poorly controlled sugars. I instructed her that she needs to make an appointment with her family physician to get this checked out. Previously she was on metformin and could not tolerate it because of diarrhea.

## 2013-11-25 NOTE — Progress Notes (Signed)
Patient Care Team: Abner Greenspan, MD as PCP - General  DIAGNOSIS: BREAST CANCER   Primary site: Breast (Right)   Staging method: AJCC 7th Edition   Pathologic: Stage IA (T1c, N0, cM0) signed by Rulon Eisenmenger, MD on 11/25/2013  9:22 AM   Summary: Stage IA (T1c, N0, cM0)   SUMMARY OF ONCOLOGIC HISTORY:   BREAST CANCER   02/17/2009 Initial Diagnosis Left breast invasive ductal carcinoma 9:00 position   05/11/2009 Surgery Right breast mastectomy fibrocystic changes: Left breast mastectomy invasive and DCIS 2 foci 1.2 and 0.7 cm 5 left axillary sentinel lymph nodes negative: Grade 1 ER 100% PR 100% Ki-67 36% and 19%: HER-2 negative ratio 1 and 1.13   05/11/2009 Procedure Immediate breast reconstruction   06/25/2009 -  Anti-estrogen oral therapy Arimidex 1 mg daily discontinued due to aches and pains changed to tamoxifen 20 mg daily: Planned treatment duration 5 years    CHIEF COMPLIANT: Distal peripheral sensory neuropathy  INTERVAL HISTORY: Valerie Ball is a 68 year old Caucasian lady with above-mentioned history of right-sided breast cancer that was treated with bilateral mastectomies. She has been on antiestrogen therapy for the past 4 and half years. She had previously to tamoxifen because of muscle aches and pains. On tamoxifen she is doing a lot better. Over the past 2 or 3 months she has noticed increased tingling and numbness of her feet. She attributes this to elevated blood sugars for which she has not seen her primary care physician. She denies any lumps or nodules or any other concerns.   REVIEW OF SYSTEMS:   Constitutional: Denies fevers, chills or abnormal weight loss Eyes: Denies blurriness of vision Ears, nose, mouth, throat, and face: Denies mucositis or sore throat Respiratory: Denies cough, dyspnea or wheezes Cardiovascular: Denies palpitation, chest discomfort or lower extremity swelling Gastrointestinal:  Denies nausea, heartburn or change in bowel habits Skin: Denies abnormal  skin rashes Lymphatics: Denies new lymphadenopathy or easy bruising Neurological:Denies numbness, tingling or new weaknesses Behavioral/Psych: Mood is stable, no new changes  All other systems were reviewed with the patient and are negative.  I have reviewed the past medical history, past surgical history, social history and family history with the patient and they are unchanged from previous note.  ALLERGIES:  is allergic to arimidex; metformin and related; and penicillins.  MEDICATIONS:  Current Outpatient Prescriptions  Medication Sig Dispense Refill  . alendronate (FOSAMAX) 70 MG tablet Take 70 mg by mouth every 7 (seven) days. On Saturday   -  Take with a full glass of water on an empty stomach.      . Cholecalciferol (VITAMIN D-3 PO) Take 1,000 mg by mouth daily.       . enalapril (VASOTEC) 20 MG tablet TAKE 1 TABLET (20 MG TOTAL) BY MOUTH AT BEDTIME.  90 tablet  1  . fluticasone (FLONASE) 50 MCG/ACT nasal spray Place 2 sprays into the nose daily as needed. Allergies  16 g  3  . omeprazole (PRILOSEC) 20 MG capsule TAKE 1 CAPSULE (20 MG TOTAL) BY MOUTH AT BEDTIME.  90 capsule  1  . rOPINIRole (REQUIP) 1 MG tablet TAKE 1 TABLET (1 MG TOTAL) BY MOUTH AT BEDTIME.  90 tablet  1  . sertraline (ZOLOFT) 100 MG tablet TAKE 1 TABLET (100 MG TOTAL) BY MOUTH AT BEDTIME.  90 tablet  1  . tamoxifen (NOLVADEX) 20 MG tablet TAKE 1 TABLET (20 MG TOTAL) BY MOUTH DAILY.  30 tablet  6  . ZETIA 10 MG tablet  TAKE 1 TABLET (10 MG TOTAL) BY MOUTH DAILY.  90 tablet  1  . [DISCONTINUED] cetirizine (ZYRTEC) 10 MG tablet Take 10 mg by mouth daily as needed. For allergy symptoms      . [DISCONTINUED] hydrochlorothiazide 25 MG tablet Take 25 mg by mouth daily.         No current facility-administered medications for this visit.    PHYSICAL EXAMINATION: ECOG PERFORMANCE STATUS: 0 - Asymptomatic  Filed Vitals:   11/25/13 0847  BP: 157/87  Pulse: 91  Temp: 98.5 F (36.9 C)  Resp: 18   Filed Weights    11/25/13 0847  Weight: 168 lb 9.6 oz (76.476 kg)    GENERAL:alert, no distress and comfortable SKIN: skin color, texture, turgor are normal, no rashes or significant lesions EYES: normal, Conjunctiva are pink and non-injected, sclera clear OROPHARYNX:no exudate, no erythema and lips, buccal mucosa, and tongue normal  NECK: supple, thyroid normal size, non-tender, without nodularity LYMPH:  no palpable lymphadenopathy in the cervical, axillary or inguinal LUNGS: clear to auscultation and percussion with normal breathing effort HEART: regular rate & rhythm and and no lower extremity edema, ejection systolic murmur in mitral area ABDOMEN:abdomen soft, non-tender and normal bowel sounds, of recent fundoplication surgery Musculoskeletal:no cyanosis of digits and no clubbing  NEURO: alert & oriented x 3 with fluent speech, distal sensory neuropathy   LABORATORY DATA:  I have reviewed the data as listed   Chemistry      Component Value Date/Time   NA 144 02/20/2013 1030   NA 142 11/29/2012 0842   NA 135 10/14/2009 1409   K 4.1 02/20/2013 1030   K 4.2 11/29/2012 0842   K 3.7 10/14/2009 1409   CL 105 11/29/2012 0842   CL 103 08/21/2012 1144   CL 96* 10/14/2009 1409   CO2 25 02/20/2013 1030   CO2 30 11/29/2012 0842   CO2 28 10/14/2009 1409   BUN 23.2 02/20/2013 1030   BUN 14 11/29/2012 0842   BUN 16 10/14/2009 1409   CREATININE 0.9 02/20/2013 1030   CREATININE 0.9 11/29/2012 0842   CREATININE 0.8 10/14/2009 1409      Component Value Date/Time   CALCIUM 9.8 02/20/2013 1030   CALCIUM 9.3 11/29/2012 0842   CALCIUM 9.2 10/14/2009 1409   ALKPHOS 90 02/20/2013 1030   ALKPHOS 64 11/29/2012 0842   ALKPHOS 75 10/14/2009 1409   AST 57* 07/01/2013 0937   AST 59* 02/20/2013 1030   AST 98* 10/14/2009 1409   ALT 34 07/01/2013 0937   ALT 45 02/20/2013 1030   ALT 67* 10/14/2009 1409   BILITOT 0.43 02/20/2013 1030   BILITOT 0.6 11/29/2012 0842   BILITOT 0.50 10/14/2009 1409       Lab Results  Component Value Date    WBC 5.6 11/25/2013   HGB 11.9 11/25/2013   HCT 37.1 11/25/2013   MCV 87.1 11/25/2013   PLT 117* 11/25/2013   NEUTROABS 3.6 11/25/2013     RADIOGRAPHIC STUDIES: No results found.   ASSESSMENT & PLAN:  BREAST CANCER Right breast IDC T1 C. N0 M0 stage IA status post bilateral mastectomies with immediate reconstruction in 2011 followed by antiestrogen therapy initially with aromatase inhibitors but because of intolerance she was switched to tamoxifen. She was tolerating tamoxifen much better and plans to complete 5 years of therapy by April 2016. Monitoring for tamoxifen-related toxicities. Patient has had hysterectomy so she does not need Pap smears. Because of bilateral mastectomies she does not need any other imaging  studies.  Thrombocytopenia, unspecified Patient had long-standing intermittent mild thrombocytopenia with platelets ranging from 110 250. She has not had any excessive bruising or bleeding. Recently she had Nissen fundoplication surgery. And did not have any excessive bleeding. Solu continue to watch and monitor. It could be a low-grade immune thrombocytopenia.  Diabetic neuropathy Patient has had neuropathy getting worse over the past 2-3 months. She reports that this is most likely related to her poorly controlled sugars. I instructed her that she needs to make an appointment with her family physician to get this checked out. Previously she was on metformin and could not tolerate it because of diarrhea.    Orders Placed This Encounter  Procedures  . CBC with Differential    Standing Status: Future     Number of Occurrences:      Standing Expiration Date: 11/25/2014  . Comprehensive metabolic panel (Cmet) - [facility]    Standing Status: Future     Number of Occurrences:      Standing Expiration Date: 11/25/2014   The patient has a good understanding of the overall plan. she agrees with it. She will call with any problems that may develop before her next visit here.  I spent 25  minutes counseling the patient face to face. The total time spent in the appointment was 30 minutes and more than 50% was on counseling and review of test results    Rulon Eisenmenger, MD 11/25/2013 9:31 AM

## 2013-11-26 ENCOUNTER — Other Ambulatory Visit: Payer: Medicare Other

## 2013-11-26 ENCOUNTER — Ambulatory Visit: Payer: Self-pay | Admitting: Hematology and Oncology

## 2013-11-27 ENCOUNTER — Telehealth: Payer: Self-pay | Admitting: Hematology and Oncology

## 2013-12-30 ENCOUNTER — Telehealth: Payer: Self-pay | Admitting: Family Medicine

## 2013-12-30 DIAGNOSIS — I10 Essential (primary) hypertension: Secondary | ICD-10-CM

## 2013-12-30 DIAGNOSIS — E785 Hyperlipidemia, unspecified: Secondary | ICD-10-CM

## 2013-12-30 DIAGNOSIS — R739 Hyperglycemia, unspecified: Secondary | ICD-10-CM

## 2013-12-30 NOTE — Telephone Encounter (Signed)
Message copied by Abner Greenspan on Tue Dec 30, 2013  5:45 PM ------      Message from: Ellamae Sia      Created: Thu Dec 25, 2013 12:03 PM      Regarding: Lab orders for Wednesday, 10.14.15       Patient is scheduled for CPX labs, please order future labs, Thanks , Terri       ------

## 2013-12-31 ENCOUNTER — Other Ambulatory Visit (INDEPENDENT_AMBULATORY_CARE_PROVIDER_SITE_OTHER): Payer: Medicare Other

## 2013-12-31 DIAGNOSIS — R739 Hyperglycemia, unspecified: Secondary | ICD-10-CM | POA: Diagnosis not present

## 2013-12-31 DIAGNOSIS — E785 Hyperlipidemia, unspecified: Secondary | ICD-10-CM | POA: Diagnosis not present

## 2013-12-31 DIAGNOSIS — R7402 Elevation of levels of lactic acid dehydrogenase (LDH): Secondary | ICD-10-CM

## 2013-12-31 DIAGNOSIS — I10 Essential (primary) hypertension: Secondary | ICD-10-CM

## 2013-12-31 DIAGNOSIS — R740 Nonspecific elevation of levels of transaminase and lactic acid dehydrogenase [LDH]: Secondary | ICD-10-CM | POA: Diagnosis not present

## 2013-12-31 LAB — CBC WITH DIFFERENTIAL/PLATELET
BASOS ABS: 0 10*3/uL (ref 0.0–0.1)
Basophils Relative: 0.2 % (ref 0.0–3.0)
Eosinophils Absolute: 0.1 10*3/uL (ref 0.0–0.7)
Eosinophils Relative: 1.6 % (ref 0.0–5.0)
HEMATOCRIT: 40.1 % (ref 36.0–46.0)
HEMOGLOBIN: 13.1 g/dL (ref 12.0–15.0)
LYMPHS ABS: 2.5 10*3/uL (ref 0.7–4.0)
Lymphocytes Relative: 28.8 % (ref 12.0–46.0)
MCHC: 32.7 g/dL (ref 30.0–36.0)
MCV: 86.9 fl (ref 78.0–100.0)
MONO ABS: 0.5 10*3/uL (ref 0.1–1.0)
Monocytes Relative: 5.6 % (ref 3.0–12.0)
NEUTROS ABS: 5.4 10*3/uL (ref 1.4–7.7)
Neutrophils Relative %: 63.8 % (ref 43.0–77.0)
PLATELETS: 168 10*3/uL (ref 150.0–400.0)
RBC: 4.61 Mil/uL (ref 3.87–5.11)
RDW: 15 % (ref 11.5–15.5)
WBC: 8.5 10*3/uL (ref 4.0–10.5)

## 2013-12-31 LAB — COMPREHENSIVE METABOLIC PANEL
ALK PHOS: 78 U/L (ref 39–117)
ALT: 53 U/L — AB (ref 0–35)
AST: 88 U/L — AB (ref 0–37)
Albumin: 3.6 g/dL (ref 3.5–5.2)
BILIRUBIN TOTAL: 0.7 mg/dL (ref 0.2–1.2)
BUN: 18 mg/dL (ref 6–23)
CO2: 26 mEq/L (ref 19–32)
Calcium: 9.2 mg/dL (ref 8.4–10.5)
Chloride: 104 mEq/L (ref 96–112)
Creatinine, Ser: 1.1 mg/dL (ref 0.4–1.2)
GFR: 55.67 mL/min — AB (ref >60.00)
Glucose, Bld: 142 mg/dL — ABNORMAL HIGH (ref 70–99)
Potassium: 4.4 mEq/L (ref 3.5–5.1)
SODIUM: 143 meq/L (ref 135–145)
TOTAL PROTEIN: 8 g/dL (ref 6.0–8.3)

## 2013-12-31 LAB — LIPID PANEL
CHOL/HDL RATIO: 7
Cholesterol: 155 mg/dL (ref 0–200)
HDL: 21.3 mg/dL — AB (ref >39.00)
NONHDL: 133.7
Triglycerides: 299 mg/dL — ABNORMAL HIGH (ref 0.0–149.0)
VLDL: 59.8 mg/dL — AB (ref 0.0–40.0)

## 2013-12-31 LAB — TSH: TSH: 4.53 u[IU]/mL — AB (ref 0.35–4.50)

## 2013-12-31 LAB — HEMOGLOBIN A1C: Hgb A1c MFr Bld: 7 % — ABNORMAL HIGH (ref 4.6–6.5)

## 2013-12-31 LAB — LDL CHOLESTEROL, DIRECT: Direct LDL: 81.1 mg/dL

## 2014-01-02 ENCOUNTER — Encounter: Payer: Self-pay | Admitting: Family Medicine

## 2014-01-07 ENCOUNTER — Ambulatory Visit (INDEPENDENT_AMBULATORY_CARE_PROVIDER_SITE_OTHER): Payer: Medicare Other | Admitting: Family Medicine

## 2014-01-07 ENCOUNTER — Encounter: Payer: Self-pay | Admitting: Family Medicine

## 2014-01-07 VITALS — BP 138/82 | HR 96 | Temp 98.3°F | Ht 63.75 in | Wt 161.8 lb

## 2014-01-07 DIAGNOSIS — R42 Dizziness and giddiness: Secondary | ICD-10-CM | POA: Insufficient documentation

## 2014-01-07 DIAGNOSIS — J302 Other seasonal allergic rhinitis: Secondary | ICD-10-CM

## 2014-01-07 DIAGNOSIS — F430 Acute stress reaction: Secondary | ICD-10-CM | POA: Diagnosis not present

## 2014-01-07 DIAGNOSIS — R51 Headache: Secondary | ICD-10-CM

## 2014-01-07 DIAGNOSIS — R519 Headache, unspecified: Secondary | ICD-10-CM

## 2014-01-07 DIAGNOSIS — J309 Allergic rhinitis, unspecified: Secondary | ICD-10-CM | POA: Insufficient documentation

## 2014-01-07 HISTORY — DX: Dizziness and giddiness: R42

## 2014-01-07 MED ORDER — FLUTICASONE PROPIONATE 50 MCG/ACT NA SUSP: 2.0000 | Freq: Every day | NASAL | Status: DC | PRN

## 2014-01-07 NOTE — Progress Notes (Signed)
Subjective:    Patient ID: Valerie Ball, female    DOB: [birth year], 68 y.o.   MRN: [id number]  HPI Here for dizziness today   (will put off her physical)   Going on for weeks  Light headed - and also she will tend to go off course when walking in a straight line  No alcohol  Much worse with changes in position  Some nausea too -occ vomits   Has had some sinus trouble - drainage  On headaches  Some pressure in face only when the weather changes No purulent drainage  She tends to have inc in temp at night - around 99-100 and feels hot  Body aches and hot (not chilled)  Has been out of flonase   No urinary symptoms  No cough   BP Readings from Last 3 Encounters:  01/07/14 152/94  11/25/13 157/87  07/01/13 140/82    No medicines are new   Wt is down 7 lb -- ? If she has changed anything -- not snacking like she used Less appetite   Stress level is very high  Is considering leaving her marriage -husband is very stubborn and difficult   (will not work on his problems) He is depressed and will not do anything and will not talk to her  She just waits on him all the time   She had her breast implants taken out - improved symptoms  Still some pain off and all    Patient Active Problem List   Diagnosis Date Noted  . Dizziness 01/07/2014  . Allergic rhinitis 01/07/2014  . Headache above the eye region 01/07/2014  . Thrombocytopenia, unspecified 11/25/2013  . Diabetic neuropathy 11/25/2013  . History of breast cancer 06/07/2012  . Lap Nissen October 2013 01/19/2012  . Large type III mixed hiatus hernia  12/06/2011  . Stress reaction 10/25/2011  . Routine general medical examination at a health care facility 07/11/2011  . ADVERSE DRUG REACTION 05/05/2010  . TRANSAMINASES, SERUM, ELEVATED 11/11/2009  . BREAST CANCER 08/02/2009  . DIVERTICULOSIS-COLON 03/03/2008  . ESOPHAGEAL STRICTURE 03/02/2008  . HIATAL HERNIA 03/02/2008  . PERITONEAL ADHESIONS 03/02/2008  .  UNSPECIFIED ANEMIA 02/07/2008  . Hyperglycemia 08/19/2007  . ANXIETY 04/15/2007  . DISORDERS, ORGANIC SLEEP NEC 08/23/2006  . SYNDROME, RESTLESS LEGS 08/23/2006  . SYNDROME, CARPAL TUNNEL 08/23/2006  . Hyperlipidemia LDL goal <100 08/22/2006  . Essential hypertension 08/22/2006  . ALLERGIC RHINITIS 08/22/2006  . GERD 08/22/2006  . FATTY LIVER DISEASE 08/22/2006  . SEIZURE DISORDER 08/22/2006   Past Medical History  Diagnosis Date  . Allergy     allegic rhinitis  . Anxiety   . GERD (gastroesophageal reflux disease)   . Hypertension   . Hyperlipidemia   . Seizure disorder   . RLS (restless legs syndrome)   . Arthritis   . Diverticulosis   . GI bleed   . Cataract   . Salmonella 05/2000    renal effects secondary to dehydration  . Hiatal hernia   . Melena 01/2008    anemia transfusion  . Esophageal stricture 02/04/2008  . Borderline diabetic     per patient medical history form  . Menopause     per patient medical history form  . Postmenopausal hormone therapy     per patient medical history form.  . Blood transfusion   . Osteoporosis   . Colon stricture 01/14/2003  . Diabetes mellitus     pt's medical doctor took her off diabetic meds-blood sugars  have not been a problem--and medications were causing pt's sugars to be too low  . Seizures     one seizure several yrs ago --etiology unknown-no problem since  . Anemia   . Breast cancer 02/2009    breast CA L invasive ductal CA(hormone receptor positive.)   Past Surgical History  Procedure Laterality Date  . Abdominal hysterectomy  1983  . Ovary surgery  1986    ovarian tumor removed  . Mastectomy  04/2009    Bilateral for ductal carcinoma on L  . Breast surgery  02/2009    breast biopsy invasive ductal CA-bilateral mastectomies  . Epigastric hernia repair  01/02/2012    Procedure: HERNIA REPAIR EPIGASTRIC ADULT;  Surgeon: Pedro Earls, MD;  Location: [facility];  Service: General;  Laterality: N/A;  Laparoscopic  Repair Paraesophageal Hernia, Nissen  . Laparoscopic nissen fundoplication  [id number]    Procedure: LAPAROSCOPIC NISSEN FUNDOPLICATION;  Surgeon: Pedro Earls, MD;  Location: [facility];  Service: General;  Laterality: N/A;   History  Substance Use Topics  . Smoking status: Never Smoker   . Smokeless tobacco: Never Used  . Alcohol Use: No   Family History  Problem Relation Age of Onset  . Heart attack Father   . Esophageal cancer Father   . Diabetes Father   . Cancer Father     esophageal  . Breast cancer Sister   . Diabetes Sister   . Cancer Sister     breast  . Hypertension Brother   . Diabetes Brother   . Breast cancer Sister   . Cancer Sister     breast  . Hypertension Mother   . Stroke Mother   . Breast cancer Mother   . Cancer Mother     breast  . Hypertension Sister    Allergies  Allergen Reactions  . Arimidex [Anastrozole]     Joint pain  . Metformin And Related     diarrhea  . Penicillins Rash   Current Outpatient Prescriptions on File Prior to Visit  Medication Sig Dispense Refill  . Cholecalciferol (VITAMIN D-3 PO) Take 1,000 mg by mouth daily.       . enalapril (VASOTEC) 20 MG tablet TAKE 1 TABLET (20 MG TOTAL) BY MOUTH AT BEDTIME.  90 tablet  1  . omeprazole (PRILOSEC) 20 MG capsule TAKE 1 CAPSULE (20 MG TOTAL) BY MOUTH AT BEDTIME.  90 capsule  1  . rOPINIRole (REQUIP) 1 MG tablet TAKE 1 TABLET (1 MG TOTAL) BY MOUTH AT BEDTIME.  90 tablet  1  . sertraline (ZOLOFT) 100 MG tablet TAKE 1 TABLET (100 MG TOTAL) BY MOUTH AT BEDTIME.  90 tablet  1  . tamoxifen (NOLVADEX) 20 MG tablet TAKE 1 TABLET (20 MG TOTAL) BY MOUTH DAILY.  30 tablet  6  . ZETIA 10 MG tablet TAKE 1 TABLET (10 MG TOTAL) BY MOUTH DAILY.  90 tablet  1  . [DISCONTINUED] cetirizine (ZYRTEC) 10 MG tablet Take 10 mg by mouth daily as needed. For allergy symptoms      . [DISCONTINUED] hydrochlorothiazide 25 MG tablet Take 25 mg by mouth daily.         No current facility-administered  medications on file prior to visit.    Review of Systems Review of Systems  Constitutional: Negative for fever, appetite change,  and unexpected weight change.  ENT pos for congestion and rhinorrhea , neg for ST  Eyes: Negative for pain and visual disturbance.  Respiratory: Negative for cough  and shortness of breath.   Cardiovascular: Negative for cp or palpitations    Gastrointestinal: Negative for nausea, diarrhea and constipation.  Genitourinary: Negative for urgency and frequency.  Skin: Negative for pallor or rash   Neurological: Negative for weakness, numbness and pos for headaches.  Hematological: Negative for adenopathy. Does not bruise/bleed easily.  Psychiatric/Behavioral: Negative for dysphoric mood. The patient is not nervous/anxious.         Objective:   Physical Exam  Constitutional: She appears well-developed and well-nourished. No distress.  overwt and well app  HENT:  Head: Normocephalic and atraumatic.  Right Ear: External ear normal.  Left Ear: External ear normal.  Mouth/Throat: Oropharynx is clear and moist. No oropharyngeal exudate.  Nares are boggy Clear rhinorrhea No sinus tenderness  No temporal tenderness   Eyes: Conjunctivae and EOM are normal. Pupils are equal, round, and reactive to light. Right eye exhibits no discharge. Left eye exhibits no discharge.  Neck: Normal range of motion. Neck supple. No JVD present. Carotid bruit is not present. No thyromegaly present.  Cardiovascular: Normal rate, regular rhythm and normal heart sounds.  Exam reveals no gallop.   Pulmonary/Chest: Effort normal and breath sounds normal. No respiratory distress. She has no wheezes. She has no rales.  Abdominal: Soft. Bowel sounds are normal.  Musculoskeletal: She exhibits no edema and no tenderness.  Lymphadenopathy:    She has no cervical adenopathy.  Neurological: She is alert. She has normal reflexes. She displays no atrophy and no tremor. No cranial nerve deficit or  sensory deficit. She exhibits normal muscle tone. Coordination and gait normal.  No cerebellar signs -but there is some back ward drift on rhomberg   Skin: Skin is warm and dry. No rash noted. No erythema. No pallor.  Psychiatric: Her speech is normal and behavior is normal. Thought content normal. Her mood appears anxious. Her affect is not blunt, not labile and not inappropriate. She exhibits a depressed mood.  Pt talks freely about stressors           Assessment & Plan:   Problem List Items Addressed This Visit     Respiratory   Allergic rhinitis     With sinus symptoms  This could add to ETD/dizziness  Will re start nasal steroid and antihistamine F/u 2 wk for re check       Other   Stress reaction     Disc stressors -difficult marital situation  Reviewed stressors/ coping techniques/symptoms/ support sources/ tx options and side effects in detail today  Recommend counseling     Dizziness - Primary     Some headache/sinus symptoms  Also stress Backward drift on rhomberg Given her hx of breast cancer- check CT of head and update  Also tx allergy symptoms  Re check planned     Relevant Orders      CT Head Wo Contrast (Completed)   Headache above the eye region     Along with dizziness  CT of head ordered

## 2014-01-07 NOTE — Patient Instructions (Signed)
Think about counseling and let me know if you are interested in a referral  Stop up front for referral for CT head  Start back on flonase  Also start claritin again 10 mg daily  Follow up for annual exam with me - in about 2 weeks   Try to take care of yourself

## 2014-01-07 NOTE — Progress Notes (Signed)
Pre visit review using our clinic review tool, if applicable. No additional management support is needed unless otherwise documented below in the visit note. 

## 2014-01-08 ENCOUNTER — Ambulatory Visit (INDEPENDENT_AMBULATORY_CARE_PROVIDER_SITE_OTHER)
Admission: RE | Admit: 2014-01-08 | Discharge: 2014-01-08 | Disposition: A | Discharge status: Home / Self Care | Payer: Medicare Other | Source: Ambulatory Visit | Attending: Family Medicine | Admitting: Family Medicine

## 2014-01-08 ENCOUNTER — Telehealth: Payer: Self-pay | Admitting: Family Medicine

## 2014-01-08 DIAGNOSIS — R42 Dizziness and giddiness: Secondary | ICD-10-CM | POA: Diagnosis not present

## 2014-01-08 DIAGNOSIS — Z853 Personal history of malignant neoplasm of breast: Secondary | ICD-10-CM | POA: Diagnosis not present

## 2014-01-08 DIAGNOSIS — R112 Nausea with vomiting, unspecified: Secondary | ICD-10-CM | POA: Diagnosis not present

## 2014-01-08 NOTE — Assessment & Plan Note (Signed)
Along with dizziness  CT of head ordered

## 2014-01-08 NOTE — Assessment & Plan Note (Signed)
Some headache/sinus symptoms  Also stress Backward drift on rhomberg Given her hx of breast cancer- check CT of head and update  Also tx allergy symptoms  Re check planned

## 2014-01-08 NOTE — Telephone Encounter (Signed)
Pt called stating she is going out of town and would like dr tower to call her cell phone [phone] with her ct results that she had done today

## 2014-01-08 NOTE — Assessment & Plan Note (Signed)
Disc stressors -difficult marital situation  Reviewed stressors/ coping techniques/symptoms/ support sources/ tx options and side effects in detail today  Recommend counseling

## 2014-01-08 NOTE — Assessment & Plan Note (Signed)
With sinus symptoms  This could add to ETD/dizziness  Will re start nasal steroid and antihistamine F/u 2 wk for re check

## 2014-01-08 NOTE — Telephone Encounter (Signed)
CT is ok-no new changes- I put result on mychart and we will disc further at her f/u (I also messaged [facility] to call her in case she did not check mychart)

## 2014-01-09 NOTE — Telephone Encounter (Signed)
Pt notified results are on Mychart and pt has viewed them

## 2014-01-20 ENCOUNTER — Encounter: Payer: Self-pay | Admitting: Family Medicine

## 2014-01-20 ENCOUNTER — Ambulatory Visit (INDEPENDENT_AMBULATORY_CARE_PROVIDER_SITE_OTHER): Payer: Medicare Other | Admitting: Family Medicine

## 2014-01-20 VITALS — BP 140/82 | HR 91 | Temp 98.6°F | Ht 63.75 in | Wt 162.2 lb

## 2014-01-20 DIAGNOSIS — R42 Dizziness and giddiness: Secondary | ICD-10-CM | POA: Diagnosis not present

## 2014-01-20 DIAGNOSIS — R251 Tremor, unspecified: Secondary | ICD-10-CM

## 2014-01-20 DIAGNOSIS — I10 Essential (primary) hypertension: Secondary | ICD-10-CM

## 2014-01-20 DIAGNOSIS — K760 Fatty (change of) liver, not elsewhere classified: Secondary | ICD-10-CM

## 2014-01-20 DIAGNOSIS — R1013 Epigastric pain: Secondary | ICD-10-CM | POA: Insufficient documentation

## 2014-01-20 DIAGNOSIS — R1011 Right upper quadrant pain: Secondary | ICD-10-CM | POA: Diagnosis not present

## 2014-01-20 HISTORY — DX: Tremor, unspecified: R25.1

## 2014-01-20 MED ORDER — OMEPRAZOLE 20 MG PO CPDR: DELAYED_RELEASE_CAPSULE | ORAL | Status: DC

## 2014-01-20 NOTE — Assessment & Plan Note (Signed)
No imp with allergy tx  No change in exam Also tremor and remote hx of seizure Rev CT Ref to Neuro

## 2014-01-20 NOTE — Patient Instructions (Signed)
Stop at check out for neurology referral  Also to schedule abdominal ultrasound - we will contact you with results and plan  Increase your prilosec 20 mg to twice daily am and pm  Keep working on stress   Will make plan based on above

## 2014-01-20 NOTE — Progress Notes (Signed)
Pre visit review using our clinic review tool, if applicable. No additional management support is needed unless otherwise documented below in the visit note. 

## 2014-01-20 NOTE — Assessment & Plan Note (Signed)
Will inc PPI to bid  Also abd pain (also RUQ pain)  Hx of esoph ulcers and fundoplication in the past  Update

## 2014-01-20 NOTE — Assessment & Plan Note (Signed)
Head and R hand today  Ref to neuro for this and dizziness Px has remote seizure hx also

## 2014-01-20 NOTE — Progress Notes (Signed)
Subjective:    Patient ID: Valerie Ball, female    DOB: January 06, 1946, 68 y.o.   MRN: [id number]  HPI Pt here with many symptoms including GI problems/dizziness/fatigue   Similar symptoms to last time - no better and no worse    bp is elevated today BP Readings from Last 3 Encounters:  01/20/14 148/100  01/07/14 138/82  11/25/13 157/87    Wt  Is stable   Last visit had trial of antihistamine and nasal steroid for dizziness  She fell a couple of times from loosing her balance  Also moving head (esp looking up)-makes that worse - so some degree of positional sympsoms  Also some head tremor - about the same  Amount of time  Hands shaking - a little less often - usually att that to low blood sugar  Also CT of head showed small vessel change and old launar R basal ganglia infarct  At this time -the dizziness has gone on since end of the summer   Elevated temp every night   GI symptoms - sore in upper abdomen No missed PPI doses  Has hx of HH (had surgery-fundiplication) and also esoph erosions  Does not have regular surg or GI f/u  No more vomiting Off and on loose stool and constipation  At times -some pain in RUQ (she still has a gallbladder)  Also R scapular pain  Last abd [place] 2011-has fatty liver (no gallstones at the time)  Stress level is similar- marital problems  Working on that   [employer] all over without joint swelling    Lab on 12/31/2013  Component Date Value Ref Range Status  . WBC 12/31/2013 8.5  4.0 - 10.5 K/uL Final  . RBC 12/31/2013 4.61  3.87 - 5.11 Mil/uL Final  . Hemoglobin 12/31/2013 13.1  12.0 - 15.0 g/dL Final  . HCT 12/31/2013 40.1  36.0 - 46.0 % Final  . MCV 12/31/2013 86.9  78.0 - 100.0 fl Final  . MCHC 12/31/2013 32.7  30.0 - 36.0 g/dL Final  . RDW 12/31/2013 15.0  11.5 - 15.5 % Final  . Platelets 12/31/2013 168.0  150.0 - 400.0 K/uL Final  . Neutrophils Relative % 12/31/2013 63.8  43.0 - 77.0 % Final  . Lymphocytes Relative 12/31/2013 28.8   12.0 - 46.0 % Final  . Monocytes Relative 12/31/2013 5.6  3.0 - 12.0 % Final  . Eosinophils Relative 12/31/2013 1.6  0.0 - 5.0 % Final  . Basophils Relative 12/31/2013 0.2  0.0 - 3.0 % Final  . Neutro Abs 12/31/2013 5.4  1.4 - 7.7 K/uL Final  . Lymphs Abs 12/31/2013 2.5  0.7 - 4.0 K/uL Final  . Monocytes Absolute 12/31/2013 0.5  0.1 - 1.0 K/uL Final  . Eosinophils Absolute 12/31/2013 0.1  0.0 - 0.7 K/uL Final  . Basophils Absolute 12/31/2013 0.0  0.0 - 0.1 K/uL Final  . Sodium 12/31/2013 143  135 - 145 mEq/L Final  . Potassium 12/31/2013 4.4  3.5 - 5.1 mEq/L Final  . Chloride 12/31/2013 104  96 - 112 mEq/L Final  . CO2 12/31/2013 26  19 - 32 mEq/L Final  . Glucose, Bld 12/31/2013 142* 70 - 99 mg/dL Final  . BUN 12/31/2013 18  6 - 23 mg/dL Final  . Creatinine, Ser 12/31/2013 1.1  0.4 - 1.2 mg/dL Final  . Total Bilirubin 12/31/2013 0.7  0.2 - 1.2 mg/dL Final  . Alkaline Phosphatase 12/31/2013 78  39 - 117 U/L Final  . AST 12/31/2013  88* 0 - 37 U/L Final  . ALT 12/31/2013 53* 0 - 35 U/L Final  . Total Protein 12/31/2013 8.0  6.0 - 8.3 g/dL Final  . Albumin 12/31/2013 3.6  3.5 - 5.2 g/dL Final  . Calcium 12/31/2013 9.2  8.4 - 10.5 mg/dL Final  . GFR 12/31/2013 55.67* >60.00 mL/min Final  . Hgb A1c MFr Bld 12/31/2013 7.0* 4.6 - 6.5 % Final   Glycemic Control Guidelines for People with Diabetes:Non Diabetic:  <6%Goal of Therapy: <7%Additional Action Suggested:  >8%   . Cholesterol 12/31/2013 155  0 - 200 mg/dL Final   ATP III Classification       Desirable:  < 200 mg/dL               Borderline High:  200 - 239 mg/dL          High:  > = 240 mg/dL  . Triglycerides 12/31/2013 299.0* 0.0 - 149.0 mg/dL Final   Normal:  <150 mg/dLBorderline High:  150 - 199 mg/dL  . HDL 12/31/2013 21.30* >39.00 mg/dL Final  . VLDL 12/31/2013 59.8* 0.0 - 40.0 mg/dL Final  . Total CHOL/HDL Ratio 12/31/2013 7   Final                  Men          Women1/2 Average Risk     3.4          3.3Average Risk          5.0           4.42X Average Risk          9.6          7.13X Average Risk          15.0          11.0                      . NonHDL 12/31/2013 133.70   Final   NOTE:  Non-HDL goal should be 30 mg/dL higher than patient's LDL goal (i.e. LDL goal of < 70 mg/dL, would have non-HDL goal of < 100 mg/dL)  . TSH 12/31/2013 4.53* 0.35 - 4.50 uIU/mL Final  . Direct LDL 12/31/2013 81.1   Final   Optimal:  <100 mg/dLNear or Above Optimal:  100-129 mg/dLBorderline High:  130-159 mg/dLHigh:  160-189 mg/dLVery High:  >190 mg/dL      Review of Systems Review of Systems  Constitutional: Negative for fever, appetite change,  and unexpected weight change.  Eyes: Negative for pain and visual disturbance.  Respiratory: Negative for cough and shortness of breath.   Cardiovascular: Negative for cp or palpitations    Gastrointestinal: Negative for  diarrhea and constipation. neg for blood in stool or black stools  Genitourinary: Negative for urgency and frequency.  Skin: Negative for pallor or rash   Neurological: Negative for weakness,, numbness and headaches. neg for facial droop or speech problems  Hematological: Negative for adenopathy. Does not bruise/bleed easily.  Psychiatric/Behavioral: Negative for dysphoric mood. The patient is nervous/anxious.  pos for stressors        Objective:   Physical Exam  Constitutional: She appears well-developed and well-nourished. No distress.  HENT:  Head: Normocephalic and atraumatic.  Right Ear: External ear normal.  Left Ear: External ear normal.  Nose: Nose normal.  Mouth/Throat: Oropharynx is clear and moist.  Eyes: Conjunctivae and EOM are normal. Pupils are equal, round, and reactive to light.  Right eye exhibits no discharge. Left eye exhibits no discharge. No scleral icterus.  Neck: Normal range of motion. Neck supple. No JVD present. No thyromegaly present.  Cardiovascular: Normal rate, regular rhythm, normal heart sounds and intact distal pulses.  Exam reveals  no gallop.   Pulmonary/Chest: Effort normal and breath sounds normal. No respiratory distress. She has no wheezes. She has no rales.  Abdominal: Soft. Bowel sounds are normal. She exhibits no distension and no mass. There is no hepatosplenomegaly. There is tenderness in the right upper quadrant and epigastric area. There is no CVA tenderness.  Musculoskeletal: She exhibits no edema or tenderness.  Lymphadenopathy:    She has no cervical adenopathy.  Neurological: She is alert. She has normal strength and normal reflexes. She displays no atrophy. No cranial nerve deficit or sensory deficit. She exhibits normal muscle tone. Coordination normal.  Some backward drift on rhomberg   Skin: Skin is warm and dry. No rash noted. No erythema. No pallor.  Psychiatric: Her speech is normal and behavior is normal. Thought content normal. Her mood appears anxious.          Assessment & Plan:   Problem List Items Addressed This Visit      Cardiovascular and Mediastinum   Essential hypertension    Initially elevated-better on re check bp in fair control at this time  BP Readings from Last 1 Encounters:  01/20/14 140/82   No changes needed Disc lifstyle change with low sodium diet and exercise        Digestive   Fatty liver    Check abd [place]  Ast/alt up a bit and now some RUQ pain  Disc need for wt loss and low fat diet    Relevant Orders      US Abdomen Complete     Other   Abdominal pain, epigastric    Will inc PPI to bid  Also abd pain (also RUQ pain)  Hx of esoph ulcers and fundoplication in the past  Update     Dizziness - Primary    No imp with allergy tx  No change in exam Also tremor and remote hx of seizure Rev CT Ref to Neuro    Relevant Orders      Ambulatory referral to Neurology   RUQ abdominal pain    Hx of fatty liver  Also elevated transminases [place] abd ordered Last lab rev     Relevant Orders      US Abdomen Complete   Tremor    Head and R hand today  Ref  to neuro for this and dizziness Px has remote seizure hx also     Relevant Orders      Ambulatory referral to Neurology

## 2014-01-20 NOTE — Assessment & Plan Note (Signed)
Initially elevated-better on re check bp in fair control at this time  BP Readings from Last 1 Encounters:  01/20/14 140/82   No changes needed Disc lifstyle change with low sodium diet and exercise

## 2014-01-20 NOTE — Assessment & Plan Note (Signed)
Check abd [place]  Ast/alt up a bit and now some RUQ pain  Disc need for wt loss and low fat diet

## 2014-01-20 NOTE — Assessment & Plan Note (Signed)
Hx of fatty liver  Also elevated transminases [place] abd ordered Last lab rev

## 2014-01-23 ENCOUNTER — Encounter: Payer: Self-pay | Admitting: Family Medicine

## 2014-01-23 ENCOUNTER — Ambulatory Visit
Admission: RE | Admit: 2014-01-23 | Discharge: 2014-01-23 | Disposition: A | Discharge status: Home / Self Care | Payer: Medicare Other | Source: Ambulatory Visit | Attending: Family Medicine | Admitting: Family Medicine

## 2014-01-23 ENCOUNTER — Ambulatory Visit (INDEPENDENT_AMBULATORY_CARE_PROVIDER_SITE_OTHER): Payer: Medicare Other | Admitting: Family Medicine

## 2014-01-23 VITALS — BP 145/90 | HR 91 | Temp 98.7°F | Ht 63.75 in | Wt 163.5 lb

## 2014-01-23 DIAGNOSIS — E2839 Other primary ovarian failure: Secondary | ICD-10-CM

## 2014-01-23 DIAGNOSIS — R739 Hyperglycemia, unspecified: Secondary | ICD-10-CM

## 2014-01-23 DIAGNOSIS — E785 Hyperlipidemia, unspecified: Secondary | ICD-10-CM | POA: Diagnosis not present

## 2014-01-23 DIAGNOSIS — I10 Essential (primary) hypertension: Secondary | ICD-10-CM | POA: Diagnosis not present

## 2014-01-23 DIAGNOSIS — Z1211 Encounter for screening for malignant neoplasm of colon: Secondary | ICD-10-CM

## 2014-01-23 DIAGNOSIS — K760 Fatty (change of) liver, not elsewhere classified: Secondary | ICD-10-CM

## 2014-01-23 DIAGNOSIS — R160 Hepatomegaly, not elsewhere classified: Secondary | ICD-10-CM | POA: Diagnosis not present

## 2014-01-23 DIAGNOSIS — R1011 Right upper quadrant pain: Secondary | ICD-10-CM

## 2014-01-23 DIAGNOSIS — Z23 Encounter for immunization: Secondary | ICD-10-CM | POA: Diagnosis not present

## 2014-01-23 DIAGNOSIS — Z0000 Encounter for general adult medical examination without abnormal findings: Secondary | ICD-10-CM

## 2014-01-23 HISTORY — DX: Other primary ovarian failure: E28.39

## 2014-01-23 HISTORY — DX: Encounter for screening for malignant neoplasm of colon: Z12.11

## 2014-01-23 HISTORY — DX: Encounter for general adult medical examination without abnormal findings: Z00.00

## 2014-01-23 NOTE — Patient Instructions (Signed)
Get back to the gym- this will help your HDL cholesterol and your blood pressure  Take the omeprazole twice daily-if not back to normal in 2 weeks you need to see GI- keep me posted  IFOB stool card for colon cancer screening  Stop at check out for referral for bone density test  Follow up in 3 months with labs prior (a1c and thyroid labs)  If you are interested in a shingles/zoster vaccine - call your insurance to check on coverage,( you should not get it within 1 month of other vaccines) , then call us for a prescription  for it to take to a pharmacy that gives the shot , or make a nurse visit to get it here depending on your coverage  pneumonia vaccine today  Review info about advance directive and get to work on it

## 2014-01-23 NOTE — Progress Notes (Signed)
Pre visit review using our clinic review tool, if applicable. No additional management support is needed unless otherwise documented below in the visit note. 

## 2014-01-23 NOTE — Progress Notes (Signed)
Subjective:    Patient ID: Valerie Ball, female    DOB: Jun 11, 1945, 68 y.o.   MRN: [id number]  HPI Here for annual medicare wellness visit and to review acute and chronic problems   Since last visit -went up on prilosec to twice daily - some improvement but not 100% normal yet  Has had a few episodes of dysphagia only when she was eating too fast   Has upcoming appt with Dr Tat on 11/18    I have personally reviewed the Medicare Annual Wellness questionnaire and have noted 1. The patient's medical and social history 2. Their use of alcohol, tobacco or illicit drugs 3. Their current medications and supplements 4. The patient's functional ability including ADL's, fall risks, home safety risks and hearing or visual             impairment. 5. Diet and physical activities 6. Evidence for depression or mood disorders  The patients weight, height, BMI have been recorded in the chart and visual acuity is per eye clinic.  I have made referrals, counseling and provided education to the patient based review of the above and I have provided the pt with a written personalized care plan for preventive services.  See scanned forms.  Routine anticipatory guidance given to patient.  See health maintenance. Colon cancer screening colonoscopy was 2008 -unable to pass the scope - not a candidate for more and she did have a CT version of it , she will do a stool card  Breast cancer screening-no longer gets mammograms due to mastectomy bilateral  Self breast exam-no lumps at surgical sites  Still on tamoxifen - makes her hot - has until march  No dexa since 5/11 (last one showed mild osteopenia)  Flu vaccine 9/15  Tetanus vaccine 5/13 Pneumovax 11/09 - due for that  Zoster vaccine- is interested in the vaccine -? If covered in office or pharmacy   Advance directive-does not have living will/POA - given packet and she will work on it  Cognitive function addressed- see scanned forms- and if  abnormal then additional documentation follows.   PMH and SH reviewed  Meds, vitals, and allergies reviewed.   ROS: See HPI.  Otherwise negative.    bp is high on first check today  No cp or palpitations or headaches or edema  No side effects to medicines  BP Readings from Last 3 Encounters:  01/23/14 158/96  01/20/14 140/82  01/07/14 138/82        hyperglycemia  Lab Results  Component Value Date   HGBA1C 7.0* 12/31/2013   she has been eating out a lot  Last a1c was 6.7 Now in the DM range  She needs to start prepping meals  She knows how to eat  Has been to DM teaching  Exercise has not been good either  Intol of metformin   Fatty liver Reviewed recent ultrasound  Lab Results  Component Value Date   ALT 53* 12/31/2013   AST 88* 12/31/2013   ALKPHOS 78 12/31/2013   BILITOT 0.7 12/31/2013    Cholesterol  Lab Results  Component Value Date   CHOL 155 12/31/2013   CHOL 184 11/29/2012   CHOL 168 07/13/2011   Lab Results  Component Value Date   HDL 21.30* 12/31/2013   HDL 32.20* 11/29/2012   HDL 37.70* 07/13/2011   No results found for: [facility] Lab Results  Component Value Date   TRIG 299.0* 12/31/2013   TRIG 265.0* 11/29/2012   TRIG  249.0* 07/13/2011   Lab Results  Component Value Date   CHOLHDL 7 12/31/2013   CHOLHDL 6 11/29/2012   CHOLHDL 4 07/13/2011   Lab Results  Component Value Date   LDLDIRECT 81.1 12/31/2013   LDLDIRECT 117.3 11/29/2012   LDLDIRECT 98.2 07/13/2011    Lab Results  Component Value Date   TSH 4.53* 12/31/2013   this is new  No goiter Is tired  No hair or skin changes   Patient Active Problem List   Diagnosis Date Noted  . Encounter for Medicare annual wellness exam 01/23/2014  . Estrogen deficiency 01/23/2014  . Colon cancer screening 01/23/2014  . Abdominal pain, epigastric 01/20/2014  . Tremor 01/20/2014  . RUQ abdominal pain 01/20/2014  . Dizziness 01/07/2014  . Allergic rhinitis 01/07/2014  . Headache above  the eye region 01/07/2014  . Thrombocytopenia, unspecified 11/25/2013  . Diabetic neuropathy 11/25/2013  . History of breast cancer 06/07/2012  . Lap Nissen October 2013 01/19/2012  . Large type III mixed hiatus hernia  12/06/2011  . Stress reaction 10/25/2011  . Routine general medical examination at a health care facility 07/11/2011  . ADVERSE DRUG REACTION 05/05/2010  . TRANSAMINASES, SERUM, ELEVATED 11/11/2009  . BREAST CANCER 08/02/2009  . DIVERTICULOSIS-COLON 03/03/2008  . ESOPHAGEAL STRICTURE 03/02/2008  . HIATAL HERNIA 03/02/2008  . PERITONEAL ADHESIONS 03/02/2008  . UNSPECIFIED ANEMIA 02/07/2008  . Hyperglycemia 08/19/2007  . ANXIETY 04/15/2007  . DISORDERS, ORGANIC SLEEP NEC 08/23/2006  . SYNDROME, RESTLESS LEGS 08/23/2006  . SYNDROME, CARPAL TUNNEL 08/23/2006  . Hyperlipidemia LDL goal <100 08/22/2006  . Essential hypertension 08/22/2006  . ALLERGIC RHINITIS 08/22/2006  . GERD 08/22/2006  . Fatty liver 08/22/2006  . SEIZURE DISORDER 08/22/2006   Past Medical History  Diagnosis Date  . Allergy     allegic rhinitis  . Anxiety   . GERD (gastroesophageal reflux disease)   . Hypertension   . Hyperlipidemia   . Seizure disorder   . RLS (restless legs syndrome)   . Arthritis   . Diverticulosis   . GI bleed   . Cataract   . Salmonella 05/2000    renal effects secondary to dehydration  . Hiatal hernia   . Melena 01/2008    anemia transfusion  . Esophageal stricture 02/04/2008  . Borderline diabetic     per patient medical history form  . Menopause     per patient medical history form  . Postmenopausal hormone therapy     per patient medical history form.  . Blood transfusion   . Osteoporosis   . Colon stricture 01/14/2003  . Diabetes mellitus     pt's medical doctor took her off diabetic meds-blood sugars have not been a problem--and medications were causing pt's sugars to be too low  . Seizures     one seizure several yrs ago --etiology unknown-no  problem since  . Anemia   . Breast cancer 02/2009    breast CA L invasive ductal CA(hormone receptor positive.)   Past Surgical History  Procedure Laterality Date  . Abdominal hysterectomy  1983  . Ovary surgery  1986    ovarian tumor removed  . Mastectomy  04/2009    Bilateral for ductal carcinoma on L  . Breast surgery  02/2009    breast biopsy invasive ductal CA-bilateral mastectomies  . Epigastric hernia repair  01/02/2012    Procedure: HERNIA REPAIR EPIGASTRIC ADULT;  Surgeon: Pedro Earls, MD;  Location: [facility];  Service: General;  Laterality: N/A;  Laparoscopic Repair  Paraesophageal Hernia, Nissen  . Laparoscopic nissen fundoplication  11/91/4782    Procedure: LAPAROSCOPIC NISSEN FUNDOPLICATION;  Surgeon: Pedro Earls, MD;  Location: [facility];  Service: General;  Laterality: N/A;   History  Substance Use Topics  . Smoking status: Never Smoker   . Smokeless tobacco: Never Used  . Alcohol Use: No   Family History  Problem Relation Age of Onset  . Heart attack Father   . Esophageal cancer Father   . Diabetes Father   . Cancer Father     esophageal  . Breast cancer Sister   . Diabetes Sister   . Cancer Sister     breast  . Hypertension Brother   . Diabetes Brother   . Breast cancer Sister   . Cancer Sister     breast  . Hypertension Mother   . Stroke Mother   . Breast cancer Mother   . Cancer Mother     breast  . Hypertension Sister    Allergies  Allergen Reactions  . Arimidex [Anastrozole]     Joint pain  . Metformin And Related     diarrhea  . Penicillins Rash   Current Outpatient Prescriptions on File Prior to Visit  Medication Sig Dispense Refill  . Cholecalciferol (VITAMIN D-3 PO) Take 1,000 mg by mouth daily.     . enalapril (VASOTEC) 20 MG tablet TAKE 1 TABLET (20 MG TOTAL) BY MOUTH AT BEDTIME. 90 tablet 1  . fluticasone (FLONASE) 50 MCG/ACT nasal spray Place 2 sprays into both nostrils daily as needed. Allergies 16 g 11  . omeprazole  (PRILOSEC) 20 MG capsule Take 1 pill by mouth twice daily 180 capsule 1  . rOPINIRole (REQUIP) 1 MG tablet TAKE 1 TABLET (1 MG TOTAL) BY MOUTH AT BEDTIME. 90 tablet 1  . sertraline (ZOLOFT) 100 MG tablet TAKE 1 TABLET (100 MG TOTAL) BY MOUTH AT BEDTIME. 90 tablet 1  . tamoxifen (NOLVADEX) 20 MG tablet TAKE 1 TABLET (20 MG TOTAL) BY MOUTH DAILY. 30 tablet 6  . ZETIA 10 MG tablet TAKE 1 TABLET (10 MG TOTAL) BY MOUTH DAILY. 90 tablet 1  . [DISCONTINUED] cetirizine (ZYRTEC) 10 MG tablet Take 10 mg by mouth daily as needed. For allergy symptoms    . [DISCONTINUED] hydrochlorothiazide 25 MG tablet Take 25 mg by mouth daily.       No current facility-administered medications on file prior to visit.    Review of Systems Review of Systems  Constitutional: Negative for fever, appetite change, unexpected weight change. pos for fatigue  Eyes: Negative for pain and visual disturbance.  Respiratory: Negative for cough and shortness of breath.   Cardiovascular: Negative for cp or palpitations    Gastrointestinal: Negative for nausea, diarrhea and constipation. pos for GI upset/ burning  Genitourinary: Negative for urgency and frequency.  Skin: Negative for pallor or rash   Neurological: Negative for weakness, light-headedness, numbness and headaches.  Hematological: Negative for adenopathy. Does not bruise/bleed easily.  Psychiatric/Behavioral: Negative for dysphoric mood. The patient is  nervous/anxious. Pos for significant stressors         Objective:   Physical Exam  Constitutional: She appears well-developed and well-nourished. No distress.  HENT:  Head: Normocephalic and atraumatic.  Right Ear: External ear normal.  Left Ear: External ear normal.  Nose: Nose normal.  Mouth/Throat: Oropharynx is clear and moist.  Eyes: Conjunctivae and EOM are normal. Pupils are equal, round, and reactive to light. Right eye exhibits no discharge. Left eye exhibits no  discharge. No scleral icterus.  Neck:  Normal range of motion. Neck supple. No JVD present. No thyromegaly present.  Cardiovascular: Normal rate, regular rhythm, normal heart sounds and intact distal pulses.  Exam reveals no gallop.   Pulmonary/Chest: Effort normal and breath sounds normal. No respiratory distress. She has no wheezes. She has no rales.  Abdominal: Soft. Bowel sounds are normal. She exhibits no distension and no mass. There is no tenderness.  Genitourinary:  S/p mastectomy - no adenopathy palpable in axillae   Musculoskeletal: She exhibits no edema or tenderness.  Lymphadenopathy:    She has no cervical adenopathy.  Neurological: She is alert. She has normal reflexes. No cranial nerve deficit. She exhibits normal muscle tone. Coordination normal.  Skin: Skin is warm and dry. No rash noted. No erythema. No pallor.  Psychiatric: She has a normal mood and affect.          Assessment & Plan:   Problem List Items Addressed This Visit      Cardiovascular and Mediastinum   Essential hypertension    bp in fair control at this time but up from previous  BP Readings from Last 1 Encounters:  01/23/14 145/90  will check again at f/u after starting an exercise program  No changes needed Disc lifstyle change with low sodium diet and exercise         Digestive   Fatty liver    Reviewed labs and abd ultrasound  Disc need for wt loss and low fat diet  Continue to follow       Other   Colon cancer screening    ifob card given     Relevant Orders      Fecal occult blood, imunochemical   Encounter for Medicare annual wellness exam - Primary    Reviewed health habits including diet and exercise and skin cancer prevention Reviewed appropriate screening tests for age  Also reviewed health mt list, fam hx and immunization status , as well as social and family history   Pneumonia vaccine today  Advanced directive disc and she will begin working on one  dexa ordered ifob given  Labs rev in detail      Estrogen deficiency    Ref for dexa Pt also on tamoxifen  Disc imp of ca and D and exercise for bone health    Relevant Orders      DG Bone Density   Hyperglycemia    Lab Results  Component Value Date   HGBA1C 7.0* 12/31/2013    This is up  Pt has been off regular diet  Will resume DM diet/ work on exercise -re check in 3 mo with f/u     Hyperlipidemia LDL goal <100    Disc goals for lipids and reasons to control them Rev labs with pt Rev low sat fat diet in detail Will continue zetia since intol of statins       Other Visit Diagnoses    Need for 23-polyvalent pneumococcal polysaccharide vaccine        Relevant Orders       Pneumococcal polysaccharide vaccine 23-valent greater than or equal to 2yo subcutaneous/IM (Completed)

## 2014-01-25 NOTE — Assessment & Plan Note (Signed)
Lab Results  Component Value Date   HGBA1C 7.0* 12/31/2013    This is up  Pt has been off regular diet  Will resume DM diet/ work on exercise -re check in 3 mo with f/u

## 2014-01-25 NOTE — Assessment & Plan Note (Signed)
ifob card given

## 2014-01-25 NOTE — Assessment & Plan Note (Signed)
Reviewed labs and abd ultrasound  Disc need for wt loss and low fat diet  Continue to follow

## 2014-01-25 NOTE — Assessment & Plan Note (Signed)
Disc goals for lipids and reasons to control them Rev labs with pt Rev low sat fat diet in detail Will continue zetia since intol of statins

## 2014-01-25 NOTE — Assessment & Plan Note (Signed)
Ref for dexa Pt also on tamoxifen  Disc imp of ca and D and exercise for bone health

## 2014-01-25 NOTE — Assessment & Plan Note (Signed)
bp in fair control at this time but up from previous  BP Readings from Last 1 Encounters:  01/23/14 145/90  will check again at f/u after starting an exercise program  No changes needed Disc lifstyle change with low sodium diet and exercise

## 2014-01-25 NOTE — Assessment & Plan Note (Signed)
Reviewed health habits including diet and exercise and skin cancer prevention Reviewed appropriate screening tests for age  Also reviewed health mt list, fam hx and immunization status , as well as social and family history   Pneumonia vaccine today  Advanced directive disc and she will begin working on one  dexa ordered ifob given  Labs rev in detail

## 2014-01-30 ENCOUNTER — Other Ambulatory Visit (INDEPENDENT_AMBULATORY_CARE_PROVIDER_SITE_OTHER): Payer: Medicare Other

## 2014-01-30 DIAGNOSIS — Z1211 Encounter for screening for malignant neoplasm of colon: Secondary | ICD-10-CM | POA: Diagnosis not present

## 2014-01-30 LAB — FECAL OCCULT BLOOD, IMMUNOCHEMICAL: FECAL OCCULT BLD: POSITIVE — AB

## 2014-02-03 ENCOUNTER — Telehealth: Payer: Self-pay | Admitting: Family Medicine

## 2014-02-03 DIAGNOSIS — R1013 Epigastric pain: Secondary | ICD-10-CM

## 2014-02-03 DIAGNOSIS — R195 Other fecal abnormalities: Secondary | ICD-10-CM | POA: Insufficient documentation

## 2014-02-03 NOTE — Telephone Encounter (Signed)
-----   Message from Tammi Sou, [place] sent at 02/03/2014  4:43 PM EST ----- Pt agrees with referral if possible can we do referral urgent due to increase abd pain, I advise pt Marion/Linda will call to schedule appt

## 2014-02-04 ENCOUNTER — Encounter: Payer: Self-pay | Admitting: Neurology

## 2014-02-04 ENCOUNTER — Ambulatory Visit (INDEPENDENT_AMBULATORY_CARE_PROVIDER_SITE_OTHER): Payer: Medicare Other | Admitting: Neurology

## 2014-02-04 ENCOUNTER — Telehealth: Payer: Self-pay | Admitting: Gastroenterology

## 2014-02-04 VITALS — Ht 64.0 in | Wt 163.0 lb

## 2014-02-04 DIAGNOSIS — Z853 Personal history of malignant neoplasm of breast: Secondary | ICD-10-CM

## 2014-02-04 DIAGNOSIS — G243 Spasmodic torticollis: Secondary | ICD-10-CM

## 2014-02-04 DIAGNOSIS — E785 Hyperlipidemia, unspecified: Secondary | ICD-10-CM

## 2014-02-04 DIAGNOSIS — R42 Dizziness and giddiness: Secondary | ICD-10-CM

## 2014-02-04 DIAGNOSIS — G609 Hereditary and idiopathic neuropathy, unspecified: Secondary | ICD-10-CM | POA: Diagnosis not present

## 2014-02-04 DIAGNOSIS — E119 Type 2 diabetes mellitus without complications: Secondary | ICD-10-CM | POA: Diagnosis not present

## 2014-02-04 DIAGNOSIS — Z5181 Encounter for therapeutic drug level monitoring: Secondary | ICD-10-CM | POA: Diagnosis not present

## 2014-02-04 LAB — VITAMIN B12: Vitamin B-12: 633 pg/mL (ref 211–911)

## 2014-02-04 LAB — FOLATE

## 2014-02-04 NOTE — Patient Instructions (Addendum)
1. Your provider has requested that you have labwork completed today. Please go to [facility] on the first floor of this building before leaving the office today. 2. We have scheduled you at [place] for your MRI Brain on 02/17/2014 at 1:45pm. Please arrive 30 minutes prior and go to [place]. If you need to change this appt please call [phone]. 3. We have scheduled you at [place] for your Carotid Ultrasound on 02/11/2014 at 2:00 pm. Please arrive 30 minutes prior and go to [place] - 1st floor. Your CT Angio Neck will follow this appt in the same location at 3:00 pm. If you need to change this appt please call [phone]. NO SOLID FOOD 4 HOURS PRIOR TO THESE TESTS.

## 2014-02-04 NOTE — Telephone Encounter (Signed)
Per Rosaria Ferries does not have preference for new GI. Scheduled with Alonza Bogus, PA on 02/05/14 at 2:30 PM.

## 2014-02-04 NOTE — Progress Notes (Signed)
Subjective:   Valerie Ball was seen in consultation in the movement disorder clinic at the request of Loura Pardon, MD.  The evaluation is for tremor.  Tremor has been going on for 4-5 months.  Pt states that it started in the head, and it happens intermittently but it is nearly every day.  No family hx of this.  It never happens when relaxed/laying down and mostly when upright.  It tends to shake in the "yes" direction and if she holds the head it goes away.   She has some hand tremor.  She noted it the other night when stirring tea and the right is worse than the left.  She is right hand dominant.  No new medications over the last few months.  Affected by caffeine:  No. (1, 12 oz coffee per day) Affected by alcohol:  unknown Affected by stress:  Yes.   (under a lot of stress) Affected by fatigue:  Yes.   Spills soup if on spoon:  Yes.   Spills glass of liquid if full:  No. Affects ADL's (tying shoes, brushing teeth, etc):  No.  Current/Previously tried tremor medications: n/a  Current medications that may exacerbate tremor:  n/a  Also c/o dizziness that has been going on since the end of the summer.  She states that it is more of an off balance feeling than a spinning sensation.  She will have some of it when looking up in a cabinet but also when she is walking and turning a corner.  Had a head CT that I reviewed, done 01/08/14.  Demonstrated old R BG infarct and significant small vessel disease.  Does have a remote hx of single seizure about 10 years ago.  She had gotten up at midnight to get a bath because she aching and that is the last thing that she remembers.  Her husband found her grasping the bathtub, making a "noise" and throwing up and having a seizure in the bathtub.  Did not put on seizure meds after evaluated at the hospital.    Outside reports reviewed: historical medical records and referral letter/letters.  Allergies  Allergen Reactions  . Arimidex [Anastrozole]    Joint pain  . Metformin And Related     diarrhea  . Penicillins Rash    Outpatient Encounter Prescriptions as of 02/04/2014  Medication Sig  . Cholecalciferol (VITAMIN D-3 PO) Take 1,000 mg by mouth daily.   . enalapril (VASOTEC) 20 MG tablet TAKE 1 TABLET (20 MG TOTAL) BY MOUTH AT BEDTIME.  . fluticasone (FLONASE) 50 MCG/ACT nasal spray Place 2 sprays into both nostrils daily as needed. Allergies  . omeprazole (PRILOSEC) 20 MG capsule Take 1 pill by mouth twice daily  . rOPINIRole (REQUIP) 1 MG tablet TAKE 1 TABLET (1 MG TOTAL) BY MOUTH AT BEDTIME.  Marland Kitchen sertraline (ZOLOFT) 100 MG tablet TAKE 1 TABLET (100 MG TOTAL) BY MOUTH AT BEDTIME.  . tamoxifen (NOLVADEX) 20 MG tablet TAKE 1 TABLET (20 MG TOTAL) BY MOUTH DAILY.  Marland Kitchen ZETIA 10 MG tablet TAKE 1 TABLET (10 MG TOTAL) BY MOUTH DAILY.    Past Medical History  Diagnosis Date  . Allergy     allegic rhinitis  . Anxiety   . GERD (gastroesophageal reflux disease)   . Hypertension   . Hyperlipidemia   . Seizure disorder   . RLS (restless legs syndrome)   . Arthritis   . Diverticulosis   . GI bleed   . Cataract   .  Salmonella 05/2000    renal effects secondary to dehydration  . Hiatal hernia   . Melena 01/2008    anemia transfusion  . Esophageal stricture 02/04/2008  . Borderline diabetic     per patient medical history form  . Menopause     per patient medical history form  . Postmenopausal hormone therapy     per patient medical history form.  . Blood transfusion   . Osteoporosis   . Colon stricture 01/14/2003  . Diabetes mellitus     pt's medical doctor took her off diabetic meds-blood sugars have not been a problem--and medications were causing pt's sugars to be too low  . Seizures     one seizure several yrs ago --etiology unknown-no problem since  . Anemia   . Breast cancer 02/2009    breast CA L invasive ductal CA(hormone receptor positive.)    Past Surgical History  Procedure Laterality Date  . Abdominal  hysterectomy  1983  . Ovary surgery  1986    ovarian tumor removed  . Mastectomy  04/2009    Bilateral for ductal carcinoma on L  . Breast surgery  02/2009    breast biopsy invasive ductal CA-bilateral mastectomies  . Epigastric hernia repair  01/02/2012    Procedure: HERNIA REPAIR EPIGASTRIC ADULT;  Surgeon: Pedro Earls, MD;  Location: [facility];  Service: General;  Laterality: N/A;  Laparoscopic Repair Paraesophageal Hernia, Nissen  . Laparoscopic nissen fundoplication  [id number]/5621    Procedure: LAPAROSCOPIC NISSEN FUNDOPLICATION;  Surgeon: Pedro Earls, MD;  Location: [facility];  Service: General;  Laterality: N/A;    History   Social History  . Marital Status: Married    Spouse Name: N/A    Number of Children: 3  . Years of Education: N/A   Occupational History  . retired    Social History Main Topics  . Smoking status: Never Smoker   . Smokeless tobacco: Never Used  . Alcohol Use: No  . Drug Use: No  . Sexual Activity: Not Currently   Other Topics Concern  . Not on file   Social History Narrative   1 cup of coffee every morning    Family Status  Relation Status Death Age  . Father Deceased 86    MI, esophageal cancer  . Sister Alive     breast cancer, HTN  . Brother Alive     HTN  . Sister Alive     breast cancer, HTN  . Mother Deceased     breast cancer, stroke, HTN  . Sister Alive     HTN  . Son Alive     healthy  . Son Alive     healthy  . Son Alive     healthy    Review of Systems A complete 10 system ROS was obtained and was negative apart from what is mentioned.   Objective:   VITALS:   Filed Vitals:   02/04/14 0816  Height: 5\' 4"  (1.626 m)  Weight: 163 lb (73.936 kg)   Gen:  Appears stated age and in NAD. HEENT:  Normocephalic, atraumatic. The mucous membranes are moist. The superficial temporal arteries are without ropiness or tenderness.  Head is side bent to the left but no head tremor noted. Cardiovascular: Regular rate and  rhythm. Lungs: Clear to auscultation bilaterally. Neck: There are no carotid bruits noted bilaterally.  NEUROLOGICAL:  Orientation:  The patient is alert and oriented x 3.  Recent and remote memory are intact.  Attention span and concentration are normal.  Able to name objects and repeat without trouble.  Fund of knowledge is appropriate Cranial nerves: There is good facial symmetry. The pupils are equal round and reactive to light bilaterally. Fundoscopic exam reveals clear disc margins bilaterally. Extraocular muscles are intact and visual fields are full to confrontational testing. Speech is fluent and clear. Soft palate rises symmetrically and there is no tongue deviation. Hearing is intact to conversational tone. Tone: Tone is good throughout. Sensation: Sensation is intact to light touch and pinprick throughout (facial, trunk, extremities). Vibration is decreased at the bilateral big toe. There is no extinction with double simultaneous stimulation. There is no sensory dermatomal level identified. Coordination:  The patient has no dysdiadichokinesia or dysmetria. Motor: Strength is 5/5 in the bilateral upper and lower extremities.  Shoulder shrug is equal bilaterally.  There is no pronator drift.  There are no fasciculations noted. DTR's: Deep tendon reflexes are 3/4 at the bilateral biceps, triceps, brachioradialis, patella and 1/4 at the bilateral achilles.  Plantar responses are downgoing bilaterally. Gait and Station: The patient is able to ambulate with slight difficulty. She has a wide based gait.  She has trouble ambulating in a tandem fashion and has trouble walking on her heels. The patient is able to stand in the Romberg position with eyes open but sways with eyes closed.   MOVEMENT EXAM: Tremor:  There is no tremor in the UE, noted most significantly with action.  The patient is able to draw Archimedes spirals without significant difficulty.  There is no tremor at rest.  The patient  is  able to pour water from one glass to another without spilling it.  Labs:  Lab Results  Component Value Date   HGBA1C 7.0* 12/31/2013        Assessment/Plan:   1.   Likely cervical dystonia  -Her head is side bent to the left with the left ear approximating the left shoulder.  It is mild and there was virtually no tremor today, but her hx was very accurate.  She has found a sensory "trick" by putting her hand or finger on her face that stops the tremor.  Told her that it may slowly worsen with time and if so, then botox is the recommended treatment, but I would not recommend that right now.  We did talk about risks/benefits/side effects of Botox, including dysphagia. 2.  Gait instability.  -I think this is multifactorial.  I do see evidence of a peripheral neuropathy on her examination, which is likely from diabetes.  However, her diabetes is well controlled.  I am going to check lab work in regards to reversible causes of peripheral neuropathy, including B12, folate, RPR, SPEP/UPEP with immunofixation.  -I would like to do an MRA of the brain, as she describes a dizziness when looking up and I wanted to make sure that there was not a problem with the vertebrobasilar system.  Her insurance would not approve the MRA of the brain but did approve the CTA and we will use that instead.  I will also order a carotid ultrasound.  -safety associated with peripheral neuropathy was discussed.  -balance therapy for PN may be of value but decided to hold on that until results of above are back and then she will decide if she wants to proceed 3.  Cerebral small vessel disease  -This was seen on her CAT scan, although not well imaged.  I would like to image this better just to  make sure that I am not missing something else.  She certainly has reasons for cerebral small vessel disease including hypertension, hyperlipidemia and diabetes mellitus. 4.  If the above are unremarkable, then I will see her back on  a when necessary basis.  Greater than 50% of 60 min visit in counseling and coordinating care

## 2014-02-05 ENCOUNTER — Emergency Department (HOSPITAL_COMMUNITY): Payer: Medicare Other

## 2014-02-05 ENCOUNTER — Encounter (HOSPITAL_COMMUNITY): Payer: Self-pay | Admitting: Emergency Medicine

## 2014-02-05 ENCOUNTER — Ambulatory Visit: Payer: Medicare Other | Admitting: Gastroenterology

## 2014-02-05 ENCOUNTER — Emergency Department (HOSPITAL_COMMUNITY)
Admission: EM | Admit: 2014-02-05 | Discharge: 2014-02-05 | Disposition: A | Discharge status: Home / Self Care | Payer: Medicare Other | Attending: Emergency Medicine | Admitting: Emergency Medicine

## 2014-02-05 DIAGNOSIS — Z862 Personal history of diseases of the blood and blood-forming organs and certain disorders involving the immune mechanism: Secondary | ICD-10-CM | POA: Diagnosis not present

## 2014-02-05 DIAGNOSIS — Z9889 Other specified postprocedural states: Secondary | ICD-10-CM | POA: Insufficient documentation

## 2014-02-05 DIAGNOSIS — K219 Gastro-esophageal reflux disease without esophagitis: Secondary | ICD-10-CM | POA: Diagnosis not present

## 2014-02-05 DIAGNOSIS — K921 Melena: Secondary | ICD-10-CM | POA: Diagnosis not present

## 2014-02-05 DIAGNOSIS — G2581 Restless legs syndrome: Secondary | ICD-10-CM | POA: Diagnosis not present

## 2014-02-05 DIAGNOSIS — Z880 Allergy status to penicillin: Secondary | ICD-10-CM | POA: Diagnosis not present

## 2014-02-05 DIAGNOSIS — Z780 Asymptomatic menopausal state: Secondary | ICD-10-CM | POA: Diagnosis not present

## 2014-02-05 DIAGNOSIS — I10 Essential (primary) hypertension: Secondary | ICD-10-CM | POA: Diagnosis not present

## 2014-02-05 DIAGNOSIS — Z8619 Personal history of other infectious and parasitic diseases: Secondary | ICD-10-CM | POA: Insufficient documentation

## 2014-02-05 DIAGNOSIS — R1084 Generalized abdominal pain: Secondary | ICD-10-CM | POA: Diagnosis not present

## 2014-02-05 DIAGNOSIS — R635 Abnormal weight gain: Secondary | ICD-10-CM | POA: Diagnosis not present

## 2014-02-05 DIAGNOSIS — Z79899 Other long term (current) drug therapy: Secondary | ICD-10-CM | POA: Diagnosis not present

## 2014-02-05 DIAGNOSIS — Z8739 Personal history of other diseases of the musculoskeletal system and connective tissue: Secondary | ICD-10-CM | POA: Insufficient documentation

## 2014-02-05 DIAGNOSIS — Z90710 Acquired absence of both cervix and uterus: Secondary | ICD-10-CM | POA: Diagnosis not present

## 2014-02-05 DIAGNOSIS — R112 Nausea with vomiting, unspecified: Secondary | ICD-10-CM | POA: Insufficient documentation

## 2014-02-05 DIAGNOSIS — Z853 Personal history of malignant neoplasm of breast: Secondary | ICD-10-CM | POA: Insufficient documentation

## 2014-02-05 DIAGNOSIS — R197 Diarrhea, unspecified: Secondary | ICD-10-CM | POA: Diagnosis not present

## 2014-02-05 DIAGNOSIS — F419 Anxiety disorder, unspecified: Secondary | ICD-10-CM | POA: Insufficient documentation

## 2014-02-05 DIAGNOSIS — E119 Type 2 diabetes mellitus without complications: Secondary | ICD-10-CM | POA: Insufficient documentation

## 2014-02-05 LAB — URINE MICROSCOPIC-ADD ON

## 2014-02-05 LAB — LIPASE, BLOOD: Lipase: 23 U/L (ref 11–59)

## 2014-02-05 LAB — URINALYSIS, ROUTINE W REFLEX MICROSCOPIC
Glucose, UA: NEGATIVE mg/dL
HGB URINE DIPSTICK: NEGATIVE
Ketones, ur: 15 mg/dL — AB
Nitrite: NEGATIVE
PH: 5 (ref 5.0–8.0)
Protein, ur: 30 mg/dL — AB
Specific Gravity, Urine: 1.045 — ABNORMAL HIGH (ref 1.005–1.030)
Urobilinogen, UA: 0.2 mg/dL (ref 0.0–1.0)

## 2014-02-05 LAB — COMPREHENSIVE METABOLIC PANEL
ALK PHOS: 72 U/L (ref 39–117)
ALT: 78 U/L — AB (ref 0–35)
AST: 204 U/L — ABNORMAL HIGH (ref 0–37)
Albumin: 3.9 g/dL (ref 3.5–5.2)
Anion gap: 21 — ABNORMAL HIGH (ref 5–15)
BUN: 26 mg/dL — ABNORMAL HIGH (ref 6–23)
CALCIUM: 8.7 mg/dL (ref 8.4–10.5)
CO2: 22 meq/L (ref 19–32)
Chloride: 102 mEq/L (ref 96–112)
Creatinine, Ser: 0.7 mg/dL (ref 0.50–1.10)
GFR calc non Af Amer: 88 mL/min — ABNORMAL LOW (ref >90)
GLUCOSE: 190 mg/dL — AB (ref 70–99)
POTASSIUM: 3.7 meq/L (ref 3.7–5.3)
Sodium: 145 mEq/L (ref 137–147)
TOTAL PROTEIN: 7.8 g/dL (ref 6.0–8.3)
Total Bilirubin: 0.6 mg/dL (ref 0.3–1.2)

## 2014-02-05 LAB — CBC WITH DIFFERENTIAL/PLATELET
Basophils Absolute: 0 10*3/uL (ref 0.0–0.1)
Basophils Relative: 0 % (ref 0–1)
Eosinophils Absolute: 0 10*3/uL (ref 0.0–0.7)
Eosinophils Relative: 0 % (ref 0–5)
HCT: 41.7 % (ref 36.0–46.0)
Hemoglobin: 13.5 g/dL (ref 12.0–15.0)
LYMPHS ABS: 0.5 10*3/uL — AB (ref 0.7–4.0)
LYMPHS PCT: 6 % — AB (ref 12–46)
MCH: 28.1 pg (ref 26.0–34.0)
MCHC: 32.4 g/dL (ref 30.0–36.0)
MCV: 86.7 fL (ref 78.0–100.0)
Monocytes Absolute: 0.2 10*3/uL (ref 0.1–1.0)
Monocytes Relative: 2 % — ABNORMAL LOW (ref 3–12)
NEUTROS PCT: 92 % — AB (ref 43–77)
Neutro Abs: 8 10*3/uL — ABNORMAL HIGH (ref 1.7–7.7)
Platelets: 128 10*3/uL — ABNORMAL LOW (ref 150–400)
RBC: 4.81 MIL/uL (ref 3.87–5.11)
RDW: 14.8 % (ref 11.5–15.5)
WBC: 8.7 10*3/uL (ref 4.0–10.5)

## 2014-02-05 LAB — RPR

## 2014-02-05 MED ORDER — ROPINIROLE HCL 1 MG PO TABS
1.0000 mg | ORAL_TABLET | Freq: Once | ORAL | Status: AC
  Administered 2014-02-05: 1 mg via ORAL
  Filled 2014-02-05: qty 1

## 2014-02-05 MED ORDER — IOHEXOL 300 MG/ML  SOLN
80.0000 mL | Freq: Once | INTRAMUSCULAR | Status: AC | PRN
  Administered 2014-02-05: 80 mL via INTRAVENOUS

## 2014-02-05 MED ORDER — ONDANSETRON 8 MG PO TBDP: 8.0000 mg | ORAL_TABLET | Freq: Three times a day (TID) | ORAL | Status: DC | PRN

## 2014-02-05 MED ORDER — ONDANSETRON HCL 4 MG/2ML IJ SOLN
4.0000 mg | Freq: Once | INTRAMUSCULAR | Status: AC
  Administered 2014-02-05: 4 mg via INTRAVENOUS
  Filled 2014-02-05: qty 2

## 2014-02-05 NOTE — Discharge Instructions (Signed)
Diarrhea °Diarrhea is frequent loose and watery bowel movements. It can cause you to feel weak and dehydrated. Dehydration can cause you to become tired and thirsty, have a dry mouth, and have decreased urination that often is dark yellow. Diarrhea is a sign of another problem, most often an infection that will not last long. In most cases, diarrhea typically lasts 2-3 days. However, it can last longer if it is a sign of something more serious. It is important to treat your diarrhea as directed by your caregiver to lessen or prevent future episodes of diarrhea. °CAUSES  °Some common causes include: °· Gastrointestinal infections caused by viruses, bacteria, or parasites. °· Food poisoning or food allergies. °· Certain medicines, such as antibiotics, chemotherapy, and laxatives. °· Artificial sweeteners and fructose. °· Digestive disorders. °HOME CARE INSTRUCTIONS °· Ensure adequate fluid intake (hydration): Have 1 cup (8 oz) of fluid for each diarrhea episode. Avoid fluids that contain simple sugars or sports drinks, fruit juices, whole milk products, and sodas. Your urine should be clear or pale yellow if you are drinking enough fluids. Hydrate with an oral rehydration solution that you can purchase at pharmacies, retail stores, and online. You can prepare an oral rehydration solution at home by mixing the following ingredients together: °·  - tsp table salt. °· ¾ tsp baking soda. °·  tsp salt substitute containing potassium chloride. °· 1  tablespoons sugar. °· 1 L (34 oz) of water. °· Certain foods and beverages may increase the speed at which food moves through the gastrointestinal (GI) tract. These foods and beverages should be avoided and include: °· Caffeinated and alcoholic beverages. °· High-fiber foods, such as raw fruits and vegetables, nuts, seeds, and whole grain breads and cereals. °· Foods and beverages sweetened with sugar alcohols, such as xylitol, sorbitol, and mannitol. °· Some foods may be well  tolerated and may help thicken stool including: °· Starchy foods, such as rice, toast, pasta, low-sugar cereal, oatmeal, grits, baked potatoes, crackers, and bagels. °· Bananas. °· Applesauce. °· Add probiotic-rich foods to help increase healthy bacteria in the GI tract, such as yogurt and fermented milk products. °· Wash your hands well after each diarrhea episode. °· Only take over-the-counter or prescription medicines as directed by your caregiver. °· Take a warm bath to relieve any burning or pain from frequent diarrhea episodes. °SEEK IMMEDIATE MEDICAL CARE IF:  °· You are unable to keep fluids down. °· You have persistent vomiting. °· You have blood in your stool, or your stools are black and tarry. °· You do not urinate in 6-8 hours, or there is only a small amount of very dark urine. °· You have abdominal pain that increases or localizes. °· You have weakness, dizziness, confusion, or light-headedness. °· You have a severe headache. °· Your diarrhea gets worse or does not get better. °· You have a fever or persistent symptoms for more than 2-3 days. °· You have a fever and your symptoms suddenly get worse. °MAKE SURE YOU:  °· Understand these instructions. °· Will watch your condition. °· Will get help right away if you are not doing well or get worse. °Document Released: 02/24/2002 Document Revised: 07/21/2013 Document Reviewed: 11/12/2011 °Exit[employer]® Patient Information ©2015 [employer]. This information is not intended to replace advice given to you by your health care provider. Make sure you discuss any questions you have with your health care provider. ° °Food Choices to Help Relieve Diarrhea °When you have diarrhea, the foods you eat and your   eating habits are very important. Choosing the right foods and drinks can help relieve diarrhea. Also, because diarrhea can last up to 7 days, you need to replace lost fluids and electrolytes (such as sodium, potassium, and chloride) in order to help prevent  dehydration.  °WHAT GENERAL GUIDELINES DO I NEED TO FOLLOW? °· Slowly drink 1 cup (8 oz) of fluid for each episode of diarrhea. If you are getting enough fluid, your urine will be clear or pale yellow. °· Eat starchy foods. Some good choices include white rice, white toast, pasta, low-fiber cereal, baked potatoes (without the skin), saltine crackers, and bagels. °· Avoid large servings of any cooked vegetables. °· Limit fruit to two servings per day. A serving is ½ cup or 1 small piece. °· Choose foods with less than 2 g of fiber per serving. °· Limit fats to less than 8 tsp (38 g) per day. °· Avoid fried foods. °· Eat foods that have probiotics in them. Probiotics can be found in certain dairy products. °· Avoid foods and beverages that may increase the speed at which food moves through the stomach and intestines (gastrointestinal tract). Things to avoid include: °· High-fiber foods, such as dried fruit, raw fruits and vegetables, nuts, seeds, and whole grain foods. °· Spicy foods and high-fat foods. °· Foods and beverages sweetened with high-fructose corn syrup, honey, or sugar alcohols such as xylitol, sorbitol, and mannitol. °WHAT FOODS ARE RECOMMENDED? °Grains °White rice. White, French, or pita breads (fresh or toasted), including plain rolls, buns, or bagels. White pasta. Saltine, soda, or graham crackers. Pretzels. Low-fiber cereal. Cooked cereals made with water (such as cornmeal, farina, or cream cereals). Plain muffins. Matzo. Melba toast. Zwieback.  °Vegetables °Potatoes (without the skin). Strained tomato and vegetable juices. Most well-cooked and canned vegetables without seeds. Tender lettuce. °Fruits °Cooked or canned applesauce, apricots, cherries, fruit cocktail, grapefruit, peaches, pears, or plums. Fresh bananas, apples without skin, cherries, grapes, cantaloupe, grapefruit, peaches, oranges, or plums.  °Meat and Other Protein Products °Baked or boiled chicken. Eggs. Tofu. Fish. Seafood. Smooth  peanut butter. Ground or well-cooked tender beef, ham, veal, lamb, pork, or poultry.  °Dairy °Plain yogurt, kefir, and unsweetened liquid yogurt. Lactose-free milk, buttermilk, or soy milk. Plain hard cheese. °Beverages °Sport drinks. Clear broths. Diluted fruit juices (except prune). Regular, caffeine-free sodas such as ginger ale. Water. Decaffeinated teas. Oral rehydration solutions. Sugar-free beverages not sweetened with sugar alcohols. °Other °Bouillon, broth, or soups made from recommended foods.  °The items listed above may not be a complete list of recommended foods or beverages. Contact your dietitian for more options. °WHAT FOODS ARE NOT RECOMMENDED? °Grains °Whole grain, whole wheat, bran, or rye breads, rolls, pastas, crackers, and cereals. Wild or brown rice. Cereals that contain more than 2 g of fiber per serving. Corn tortillas or taco shells. Cooked or dry oatmeal. Granola. Popcorn. °Vegetables °Raw vegetables. Cabbage, broccoli, Brussels sprouts, artichokes, baked beans, beet greens, corn, kale, legumes, peas, sweet potatoes, and yams. Potato skins. Cooked spinach and cabbage. °Fruits °Dried fruit, including raisins and dates. Raw fruits. Stewed or dried prunes. Fresh apples with skin, apricots, mangoes, pears, raspberries, and strawberries.  °Meat and Other Protein Products °Chunky peanut butter. Nuts and seeds. Beans and lentils. Bacon.  °Dairy °High-fat cheeses. Milk, chocolate milk, and beverages made with milk, such as milk shakes. Cream. Ice cream. °Sweets and Desserts °Sweet rolls, doughnuts, and sweet breads. Pancakes and waffles. °Fats and Oils °Butter. Cream sauces. Margarine. Salad oils. Plain salad dressings. Olives.   Avocados.  °Beverages °Caffeinated beverages (such as coffee, tea, soda, or energy drinks). Alcoholic beverages. Fruit juices with pulp. Prune juice. Soft drinks sweetened with high-fructose corn syrup or sugar alcohols. °Other °Coconut. Hot sauce. Chili powder. Mayonnaise.  Gravy. Cream-based or milk-based soups.  °The items listed above may not be a complete list of foods and beverages to avoid. Contact your dietitian for more information. °WHAT SHOULD I DO IF I BECOME DEHYDRATED? °Diarrhea can sometimes lead to dehydration. Signs of dehydration include dark urine and dry mouth and skin. If you think you are dehydrated, you should rehydrate with an oral rehydration solution. These solutions can be purchased at pharmacies, retail stores, or online.  °Drink ½-1 cup (120-240 mL) of oral rehydration solution each time you have an episode of diarrhea. If drinking this amount makes your diarrhea worse, try drinking smaller amounts more often. For example, drink 1-3 tsp (5-15 mL) every 5-10 minutes.  °A general rule for staying hydrated is to drink 1½-2 L of fluid per day. Talk to your health care provider about the specific amount you should be drinking each day. Drink enough fluids to keep your urine clear or pale yellow. °Document Released: 05/27/2003 Document Revised: 03/11/2013 Document Reviewed: 01/27/2013 °Exit[employer]® Patient Information ©2015 [employer]. This information is not intended to replace advice given to you by your health care provider. Make sure you discuss any questions you have with your health care provider. ° °Nausea and Vomiting °Nausea is a sick feeling that often comes before throwing up (vomiting). Vomiting is a reflex where stomach contents come out of your mouth. Vomiting can cause severe loss of body fluids (dehydration). Children and elderly adults can become dehydrated quickly, especially if they also have diarrhea. Nausea and vomiting are symptoms of a condition or disease. It is important to find the cause of your symptoms. °CAUSES  °· Direct irritation of the stomach lining. This irritation can result from increased acid production (gastroesophageal reflux disease), infection, food poisoning, taking certain medicines (such as nonsteroidal anti-inflammatory  drugs), alcohol use, or tobacco use. °· Signals from the brain. These signals could be caused by a headache, heat exposure, an inner ear disturbance, increased pressure in the brain from injury, infection, a tumor, or a concussion, pain, emotional stimulus, or metabolic problems. °· An obstruction in the gastrointestinal tract (bowel obstruction). °· Illnesses such as diabetes, hepatitis, gallbladder problems, appendicitis, kidney problems, cancer, sepsis, atypical symptoms of a heart attack, or eating disorders. °· Medical treatments such as chemotherapy and radiation. °· Receiving medicine that makes you sleep (general anesthetic) during surgery. °DIAGNOSIS °Your caregiver may ask for tests to be done if the problems do not improve after a few days. Tests may also be done if symptoms are severe or if the reason for the nausea and vomiting is not clear. Tests may include: °· Urine tests. °· Blood tests. °· Stool tests. °· Cultures (to look for evidence of infection). °· X-rays or other imaging studies. °Test results can help your caregiver make decisions about treatment or the need for additional tests. °TREATMENT °You need to stay well hydrated. Drink frequently but in small amounts. You may wish to drink water, sports drinks, clear broth, or eat frozen ice pops or gelatin dessert to help stay hydrated. When you eat, eating slowly may help prevent nausea. There are also some antinausea medicines that may help prevent nausea. °HOME CARE INSTRUCTIONS  °· Take all medicine as directed by your caregiver. °· If you do not have an appetite, do not force   yourself to eat. However, you must continue to drink fluids. °· If you have an appetite, eat a normal diet unless your caregiver tells you differently. °¨ Eat a variety of complex carbohydrates (rice, wheat, potatoes, bread), lean meats, yogurt, fruits, and vegetables. °¨ Avoid high-fat foods because they are more difficult to digest. °· Drink enough water and fluids to  keep your urine clear or pale yellow. °· If you are dehydrated, ask your caregiver for specific rehydration instructions. Signs of dehydration may include: °¨ Severe thirst. °¨ Dry lips and mouth. °¨ Dizziness. °¨ Dark urine. °¨ Decreasing urine frequency and amount. °¨ Confusion. °¨ Rapid breathing or pulse. °SEEK IMMEDIATE MEDICAL CARE IF:  °· You have blood or brown flecks (like coffee grounds) in your vomit. °· You have black or bloody stools. °· You have a severe headache or stiff neck. °· You are confused. °· You have severe abdominal pain. °· You have chest pain or trouble breathing. °· You do not urinate at least once every 8 hours. °· You develop cold or clammy skin. °· You continue to vomit for longer than 24 to 48 hours. °· You have a fever. °MAKE SURE YOU:  °· Understand these instructions. °· Will watch your condition. °· Will get help right away if you are not doing well or get worse. °Document Released: 03/06/2005 Document Revised: 05/29/2011 Document Reviewed: 08/03/2010 °[employer]® Patient Information ©2015 [employer]. This information is not intended to replace advice given to you by your health care provider. Make sure you discuss any questions you have with your health care provider. ° °

## 2014-02-05 NOTE — ED Notes (Signed)
Patient transported to CT 

## 2014-02-05 NOTE — ED Notes (Signed)
IV attempted x 2 by this RN; second RN at bedside

## 2014-02-05 NOTE — ED Notes (Signed)
CT notified that the pt is finished with her contrast.

## 2014-02-05 NOTE — ED Notes (Signed)
Pt. reports nausea, vomitting and diarrhea onset yesterday afternoon , denies fever or chills.

## 2014-02-05 NOTE — ED Provider Notes (Signed)
CSN: [id number]     Arrival date & time 02/05/14  0227 History   First MD Initiated Contact with Patient 02/05/14 0350     Chief Complaint  Patient presents with  . Emesis  . Diarrhea     (Consider location/radiation/quality/duration/timing/severity/associated sxs/prior Treatment) HPI 68 year old female presents to the emergency department from home with complaint of nausea, vomiting and diarrhea starting yesterday afternoon around 3 PM.  Patient reports her emesis has been dark, like digested blood at times.  Patient has mild diffuse abdominal pain.  Patient is supposed to follow-up with GI today secondary to having a positive fecal occult blood test done after physical exam recently.  Patient reports over the last month she has had low-grade fevers to 100.3 each evening.  She has had a 10 pound weight gain.  Patient reports that she has had a history of intermittent constipation and diarrhea.  She is unable to have colonoscopy secondary to a flexion in her colon that does not allow passage of scope.  No prior history of GI bleeding.  She reports she had a hiatal hernia repair about a year and half ago and has been doing well since then.  No unusual food, no sick contacts, no travel, no well water. Past Medical History  Diagnosis Date  . Allergy     allegic rhinitis  . Anxiety   . GERD (gastroesophageal reflux disease)   . Hypertension   . Hyperlipidemia   . Seizure disorder   . RLS (restless legs syndrome)   . Arthritis   . Diverticulosis   . GI bleed   . Cataract   . Salmonella 05/2000    renal effects secondary to dehydration  . Hiatal hernia   . Melena 01/2008    anemia transfusion  . Esophageal stricture 02/04/2008  . Borderline diabetic     per patient medical history form  . Menopause     per patient medical history form  . Postmenopausal hormone therapy     per patient medical history form.  . Blood transfusion   . Osteoporosis   . Colon stricture 01/14/2003  .  Diabetes mellitus     pt's medical doctor took her off diabetic meds-blood sugars have not been a problem--and medications were causing pt's sugars to be too low  . Seizures     one seizure several yrs ago --etiology unknown-no problem since  . Anemia   . Breast cancer 02/2009    breast CA L invasive ductal CA(hormone receptor positive.)   Past Surgical History  Procedure Laterality Date  . Abdominal hysterectomy  1983  . Ovary surgery  1986    ovarian tumor removed  . Mastectomy  04/2009    Bilateral for ductal carcinoma on L  . Breast surgery  02/2009    breast biopsy invasive ductal CA-bilateral mastectomies  . Epigastric hernia repair  01/02/2012    Procedure: HERNIA REPAIR EPIGASTRIC ADULT;  Surgeon: Pedro Earls, MD;  Location: [facility];  Service: General;  Laterality: N/A;  Laparoscopic Repair Paraesophageal Hernia, Nissen  . Laparoscopic nissen fundoplication  75/17/0017    Procedure: LAPAROSCOPIC NISSEN FUNDOPLICATION;  Surgeon: Pedro Earls, MD;  Location: [facility];  Service: General;  Laterality: N/A;   Family History  Problem Relation Age of Onset  . Heart attack Father   . Esophageal cancer Father   . Diabetes Father   . Cancer Father     esophageal  . Breast cancer Sister   . Diabetes Sister   .  Cancer Sister     breast  . Hypertension Brother   . Diabetes Brother   . Breast cancer Sister   . Cancer Sister     breast  . Hypertension Mother   . Stroke Mother   . Breast cancer Mother   . Cancer Mother     breast  . Hypertension Sister    History  Substance Use Topics  . Smoking status: Never Smoker   . Smokeless tobacco: Never Used  . Alcohol Use: No   OB History    No data available     Review of Systems   See History of Present Illness; otherwise all other systems are reviewed and negative  Allergies  Arimidex; Metformin and related; and Penicillins  Home Medications   Prior to Admission medications   Medication Sig Start Date End  Date Taking? Authorizing Provider  Multiple Vitamin (MULTIVITAMIN WITH MINERALS) TABS tablet Take 1 tablet by mouth daily.   Yes Historical Provider, MD  Cholecalciferol (VITAMIN D-3 PO) Take 1,000 mg by mouth daily.     Historical Provider, MD  enalapril (VASOTEC) 20 MG tablet TAKE 1 TABLET (20 MG TOTAL) BY MOUTH AT BEDTIME. 11/12/13   Abner Greenspan, MD  fluticasone (FLONASE) 50 MCG/ACT nasal spray Place 2 sprays into both nostrils daily as needed. Allergies 01/07/14   Abner Greenspan, MD  omeprazole (PRILOSEC) 20 MG capsule Take 1 pill by mouth twice daily 01/20/14   Abner Greenspan, MD  rOPINIRole (REQUIP) 1 MG tablet TAKE 1 TABLET (1 MG TOTAL) BY MOUTH AT BEDTIME. 11/12/13   Abner Greenspan, MD  sertraline (ZOLOFT) 100 MG tablet TAKE 1 TABLET (100 MG TOTAL) BY MOUTH AT BEDTIME. 11/12/13   Abner Greenspan, MD  tamoxifen (NOLVADEX) 20 MG tablet TAKE 1 TABLET (20 MG TOTAL) BY MOUTH DAILY.    Chauncey Cruel, MD  ZETIA 10 MG tablet TAKE 1 TABLET (10 MG TOTAL) BY MOUTH DAILY. 11/12/13   Wynelle Fanny Tower, MD   BP 151/78 mmHg  Pulse 111  Temp(Src) 98.8 F (37.1 C) (Oral)  Resp 18  SpO2 94%  LMP 03/20/1981 Physical Exam  Constitutional: She is oriented to person, place, and time. She appears well-developed and well-nourished. She appears distressed (patient is having restless leg symptoms, denies any current pain).  HENT:  Head: Normocephalic and atraumatic.  Nose: Nose normal.  Mouth/Throat: Oropharynx is clear and moist.  Eyes: Conjunctivae and EOM are normal. Pupils are equal, round, and reactive to light.  Neck: Normal range of motion. Neck supple. No JVD present. No tracheal deviation present. No thyromegaly present.  Cardiovascular: Normal rate, regular rhythm, normal heart sounds and intact distal pulses.  Exam reveals no gallop and no friction rub.   No murmur heard. Pulmonary/Chest: Effort normal and breath sounds normal. No stridor. No respiratory distress. She has no wheezes. She has no rales.  She exhibits no tenderness.  Abdominal: Soft. Bowel sounds are normal. She exhibits no distension and no mass. There is no tenderness. There is no rebound and no guarding.  Musculoskeletal: Normal range of motion. She exhibits no edema or tenderness.  Lymphadenopathy:    She has no cervical adenopathy.  Neurological: She is alert and oriented to person, place, and time. She displays normal reflexes. She exhibits normal muscle tone. Coordination normal.  Skin: Skin is warm and dry. No rash noted. No erythema. No pallor.  Psychiatric: She has a normal mood and affect. Her behavior is normal. Judgment and thought  content normal.  Nursing note and vitals reviewed.   ED Course  Procedures (including critical care time) Labs Review Labs Reviewed  CBC WITH DIFFERENTIAL - Abnormal; Notable for the following:    Platelets 128 (*)    Neutrophils Relative % 92 (*)    Neutro Abs 8.0 (*)    Lymphocytes Relative 6 (*)    Lymphs Abs 0.5 (*)    Monocytes Relative 2 (*)    All other components within normal limits  COMPREHENSIVE METABOLIC PANEL - Abnormal; Notable for the following:    Glucose, Bld 190 (*)    BUN 26 (*)    AST 204 (*)    ALT 78 (*)    GFR calc non Af Amer 88 (*)    Anion gap 21 (*)    All other components within normal limits  URINALYSIS, ROUTINE W REFLEX MICROSCOPIC - Abnormal; Notable for the following:    Color, Urine ORANGE (*)    APPearance CLOUDY (*)    Specific Gravity, Urine 1.045 (*)    Bilirubin Urine SMALL (*)    Ketones, ur 15 (*)    Protein, ur 30 (*)    Leukocytes, UA TRACE (*)    All other components within normal limits  URINE MICROSCOPIC-ADD ON - Abnormal; Notable for the following:    Squamous Epithelial / LPF MANY (*)    Bacteria, UA FEW (*)    All other components within normal limits  LIPASE, BLOOD    Imaging Review Ct Abdomen Pelvis W Contrast  02/05/2014   CLINICAL DATA:  10 pound weight loss with increasing abdominal girth. Nausea, vomiting,  diarrhea, heme-positive stools. Abdominal discomfort.  EXAM: CT ABDOMEN AND PELVIS WITH CONTRAST  TECHNIQUE: Multidetector CT imaging of the abdomen and pelvis was performed using the standard protocol following bolus administration of intravenous contrast.  CONTRAST:  22mL OMNIPAQUE IOHEXOL 300 MG/ML  SOLN  COMPARISON:  09/20/2011  FINDINGS: Lung bases are clear. Esophageal hiatal hernia with residual contrast material in the lower esophagus suggesting reflux or dysmotility. Surgical clips at the GE junction suggesting previous fundoplication.  Diffuse fatty infiltration of the liver. The gallbladder, spleen, adrenal glands, pancreas, inferior vena cava, and retroperitoneal lymph nodes are unremarkable. Calcification of the aorta without aneurysm. Subcentimeter parenchymal cysts in the kidneys. No hydronephrosis or solid mass. Stomach is not abnormally distended. The small bowel appear unremarkable. Fluid filled colon without distention or wall thickening. This is suggestive of diarrhea and may indicate colonic inflammatory process. No free air or free fluid in the abdomen.  Pelvis: Appendix is normal. Diverticulosis of sigmoid colon without evidence of diverticulitis. Uterus is surgically absent. No free or loculated pelvic fluid collections. No pelvic mass or lymphadenopathy. With mild degenerative changes in the lumbar spine. No destructive bone lesions.  IMPRESSION: Liquid stool in a colon consistent with diarrhea. Diffuse fatty infiltration of the liver. No abdominal mass or lymphadenopathy. Esophageal hiatal hernia with evidence of esophageal dysmotility versus reflux.   Electronically Signed   By: Lucienne Capers M.D.   On: 02/05/2014 06:40     EKG Interpretation None      MDM   Final diagnoses:  Nausea and vomiting  Diarrhea   68 year old female with acute onset of nausea, vomiting and diarrhea yesterday afternoon, recent discovery of positive fecal occult blood test, patient feels that her  abdomen is getting more distended, remote history of breast cancer and weight gain is concerning.  Plan for CT abdomen and pelvis  Kalman Drape,  MD 02/05/14 [phone]

## 2014-02-06 ENCOUNTER — Other Ambulatory Visit: Payer: Medicare Other

## 2014-02-06 LAB — SPEP & IFE WITH QIG
ALPHA-1-GLOBULIN: 5 % — AB (ref 2.9–4.9)
Albumin ELP: 52.9 % — ABNORMAL LOW (ref 55.8–66.1)
Alpha-2-Globulin: 10.2 % (ref 7.1–11.8)
BETA 2: 6.4 % (ref 3.2–6.5)
Beta Globulin: 9.2 % — ABNORMAL HIGH (ref 4.7–7.2)
GAMMA GLOBULIN: 16.3 % (ref 11.1–18.8)
IGA: 421 mg/dL — AB (ref 69–380)
IgG (Immunoglobin G), Serum: 1260 mg/dL (ref 690–1700)
IgM, Serum: 104 mg/dL (ref 52–322)
TOTAL PROTEIN, SERUM ELECTROPHOR: 7.6 g/dL (ref 6.0–8.3)

## 2014-02-06 LAB — UIFE/LIGHT CHAINS/TP QN, 24-HR UR
ALPHA 1 UR: DETECTED — AB
Albumin, U: DETECTED
Alpha 2, Urine: DETECTED — AB
Beta, Urine: DETECTED — AB
GAMMA UR: DETECTED — AB
Total Protein, Urine: 22 mg/dL (ref 5–24)

## 2014-02-11 ENCOUNTER — Ambulatory Visit
Admission: RE | Admit: 2014-02-11 | Discharge: 2014-02-11 | Disposition: A | Discharge status: Home / Self Care | Payer: Medicare Other | Source: Ambulatory Visit | Attending: Neurology | Admitting: Neurology

## 2014-02-11 DIAGNOSIS — I6523 Occlusion and stenosis of bilateral carotid arteries: Secondary | ICD-10-CM | POA: Diagnosis not present

## 2014-02-11 DIAGNOSIS — E785 Hyperlipidemia, unspecified: Secondary | ICD-10-CM

## 2014-02-11 DIAGNOSIS — R42 Dizziness and giddiness: Secondary | ICD-10-CM

## 2014-02-11 MED ORDER — IOHEXOL 350 MG/ML SOLN
80.0000 mL | Freq: Once | INTRAVENOUS | Status: AC | PRN
  Administered 2014-02-11: 80 mL via INTRAVENOUS

## 2014-02-16 ENCOUNTER — Telehealth: Payer: Self-pay | Admitting: Neurology

## 2014-02-16 NOTE — Telephone Encounter (Signed)
-----   Message from [place], DO sent at 02/15/2014  4:29 PM EST ----- Let pt know that her carotid u/s looked okay

## 2014-02-16 NOTE — Telephone Encounter (Signed)
Patient made aware that US carotid and CTA Brain looked okay. She will call with any questions or problems.

## 2014-02-17 ENCOUNTER — Ambulatory Visit
Admission: RE | Admit: 2014-02-17 | Discharge: 2014-02-17 | Disposition: A | Discharge status: Home / Self Care | Payer: Medicare Other | Source: Ambulatory Visit | Attending: Neurology | Admitting: Neurology

## 2014-02-17 DIAGNOSIS — Z853 Personal history of malignant neoplasm of breast: Secondary | ICD-10-CM | POA: Diagnosis not present

## 2014-02-17 DIAGNOSIS — R531 Weakness: Secondary | ICD-10-CM | POA: Diagnosis not present

## 2014-02-17 DIAGNOSIS — R251 Tremor, unspecified: Secondary | ICD-10-CM | POA: Diagnosis not present

## 2014-02-17 DIAGNOSIS — R42 Dizziness and giddiness: Secondary | ICD-10-CM

## 2014-02-18 ENCOUNTER — Telehealth: Payer: Self-pay | Admitting: *Deleted

## 2014-02-18 ENCOUNTER — Encounter: Payer: Self-pay | Admitting: Gastroenterology

## 2014-02-18 ENCOUNTER — Ambulatory Visit (INDEPENDENT_AMBULATORY_CARE_PROVIDER_SITE_OTHER): Payer: Medicare Other | Admitting: Gastroenterology

## 2014-02-18 VITALS — BP 134/70 | HR 92 | Ht 64.0 in | Wt 162.2 lb

## 2014-02-18 DIAGNOSIS — R195 Other fecal abnormalities: Secondary | ICD-10-CM | POA: Diagnosis not present

## 2014-02-18 DIAGNOSIS — R945 Abnormal results of liver function studies: Secondary | ICD-10-CM

## 2014-02-18 DIAGNOSIS — R110 Nausea: Secondary | ICD-10-CM | POA: Diagnosis not present

## 2014-02-18 DIAGNOSIS — R1013 Epigastric pain: Secondary | ICD-10-CM

## 2014-02-18 DIAGNOSIS — R7989 Other specified abnormal findings of blood chemistry: Secondary | ICD-10-CM

## 2014-02-18 MED ORDER — SUCRALFATE 1 G PO TABS: 1.0000 g | ORAL_TABLET | Freq: Three times a day (TID) | ORAL | Status: DC

## 2014-02-18 MED ORDER — MOVIPREP 100 G PO SOLR
1.0000 | Freq: Once | ORAL | Status: DC
Start: 2014-02-18 — End: 2014-03-24

## 2014-02-18 NOTE — Progress Notes (Signed)
02/18/2014 Valerie Ball [id number] 10-Oct-1945   History of Present Illness:  This is a 68 year old female who is previously known to Dr. Sharlett Iles. She was last seen in June 2013. She presents to our office today at the request of her PCP, Dr. Glori Bickers, for evaluation of a couple different issues. First, she recently had a positive immunochemistry fecal occult blood test. Second, she has been experiencing epigastric abdominal pain with nausea and 10-12 pound weight loss for the past few months.  Her GI history includes an EGD in August 2013 which was normal except for a large hiatal hernia. CT scan around that time showed approximately 50% of her stomach was in her chest. She had a Nissen fundoplication in October 2229. She has been maintained on omeprazole 20 mg twice daily, which overall controls her reflux symptoms as long she takes it. She's had multiple failed attempts at colonoscopy in the past, with last being October 2004. She has a markedly tortuous sigmoid colon. Since that time she's had barium enemas and in July 2013 she had a virtual colonoscopy that showed a 5-6 mm sessile polyp in the cecum.  In regards to her symptoms, the nausea, soreness in her epigastrium, and weight loss began a few months ago as stated above. She says that the pain is described as a "gnawing" pain and sometimes goes into her back. At worst the intensity is about 5 out of 10. She feels like the pain worsens when her stomach is empty. She says that her symptoms feel similar to when she had the hiatal hernia previously. She went to the emergency department on November 19 for these symptoms at which time she had a CT scan of the abdomen and pelvis with contrast which showed diffuse fatty ultrasound the liver, esophageal hiatal hernia with evidence of esophageal dysmotility versus reflux. Also showed liquid stool in the colon consistent with diarrhea. He says that the diarrhea has been something that is coming and  going for a while. Recent ultrasound in November showed a normal gallbladder and changes consistent with fatty liver.  Hemoglobin is normal, white blood cell count is normal, lipase is normal.  Of note, her liver tests were recently elevated as well with an AST of 204, ALT 78, alkaline phosphatase 72, and total bilirubin 0.6. They have been elevated some in the past as well but not quite as high.  Current Medications, Allergies, Past Medical History, Past Surgical History, Family History and Social History were reviewed in [employer] record.   Physical Exam: BP 134/70 mmHg  Pulse 92  Ht 5\' 4"  (1.626 m)  Wt 162 lb 4 oz (73.596 kg)  BMI 27.84 kg/m2  LMP 03/20/1981 General: Well developed white female in no acute distress Head: Normocephalic and atraumatic Eyes:  Sclerae anicteric, conjunctiva pink  Ears: Normal auditory acuity Lungs: Clear throughout to auscultation Heart: Regular rate and rhythm Abdomen: Soft, non-distended.  Normal bowel sounds.  Non-tender. Musculoskseletal: Symmetrical with no gross deformities  Extremities: No edema  Neurological: Alert oriented x 4, grossly non-focal Psychological:  Alert and cooperative. Normal mood and affect  Assessment and Recommendations: -Heme positive stool:  Multiple previous failed colonoscopies in the past due to markedly tortuous sigmoid colon.  Had virtual colonoscopy in 09/2011 that showed a 5-6 mm cecal polyp. -Epigastric pain and nausea with weight loss:  Symptoms present for a few months. -History of large hiatal hernia s/p Nissen in 12/2011:  Now appears to have recurrence of  hiatal hernia on recent CT scan. -Elevated LFT's (previously had slight elevation, but higher recently):  Although ultrasound is normal, could consider biliary dyskinesia/chronic cholecystitis especially with her recent symptoms.  She does have fatty liver, which can account for the chronic mild elevation.  *Will schedule EGD for  evaluation of symptoms with history of hiatal hernia.   *In regards to heme positive stools, we will schedule for re-attempt at colonoscopy with pediatric colonoscope. **The risks, benefits, and alternatives were discussed with the patient and she consents to proceed.  *Continue omeprazole 20 mg BID and zofran prn. *Will try carafate ACHS to see if this gives her any further symptomatic relief in the interim. *If EGD is negative then would consider HIDA scan. *Will repeat LFT's today, but if they remain elevated then once again would consider HIDA scan.

## 2014-02-18 NOTE — Patient Instructions (Addendum)
You have been scheduled for an endoscopy and colonoscopy with Dr. Henrene Pastor. Please follow the written instructions given to you at your visit today. Please pick up your prep at the pharmacy within the next 1-3 days. If you use inhalers (even only as needed), please bring them with you on the day of your procedure. Your physician has requested that you go to www.startemmi.com and enter the access code given to you at your visit today. This web site gives a general overview about your procedure. However, you should still follow specific instructions given to you by our office regarding your preparation for the procedure.  We have sent the following medications to your pharmacy for you to pick up at your convenience: Carafate tablet, take before meals and before bedtime

## 2014-02-18 NOTE — Progress Notes (Signed)
Case discussed with advanced practitioner. Agree with assessment and plans as outlined. Alternative ways to evaluate her colon was discussed.

## 2014-02-18 NOTE — Telephone Encounter (Signed)
CONTACTED PATIENT, SHE WILL BE HERE TOMORROW FOR LABS

## 2014-02-19 ENCOUNTER — Encounter: Payer: Self-pay | Admitting: Gastroenterology

## 2014-02-19 ENCOUNTER — Telehealth: Payer: Self-pay | Admitting: Neurology

## 2014-02-19 NOTE — Telephone Encounter (Signed)
Patient made aware of results.  

## 2014-02-19 NOTE — Telephone Encounter (Signed)
-----   Message from [place], DO sent at 02/19/2014  2:36 PM EST ----- Tell pt nothing unexpected on MRI.  Mod small vessel disease, likely from DM, HTN, hyperlipid

## 2014-02-20 ENCOUNTER — Other Ambulatory Visit (INDEPENDENT_AMBULATORY_CARE_PROVIDER_SITE_OTHER): Payer: Medicare Other

## 2014-02-20 ENCOUNTER — Ambulatory Visit
Admission: RE | Admit: 2014-02-20 | Discharge: 2014-02-20 | Disposition: A | Discharge status: Home / Self Care | Payer: Medicare Other | Source: Ambulatory Visit | Attending: Family Medicine | Admitting: Family Medicine

## 2014-02-20 DIAGNOSIS — E2839 Other primary ovarian failure: Secondary | ICD-10-CM

## 2014-02-20 DIAGNOSIS — R7989 Other specified abnormal findings of blood chemistry: Secondary | ICD-10-CM

## 2014-02-20 DIAGNOSIS — Z780 Asymptomatic menopausal state: Secondary | ICD-10-CM | POA: Diagnosis not present

## 2014-02-20 DIAGNOSIS — Z853 Personal history of malignant neoplasm of breast: Secondary | ICD-10-CM | POA: Diagnosis not present

## 2014-02-20 DIAGNOSIS — R945 Abnormal results of liver function studies: Principal | ICD-10-CM

## 2014-02-20 DIAGNOSIS — Z13820 Encounter for screening for osteoporosis: Secondary | ICD-10-CM | POA: Diagnosis not present

## 2014-02-20 LAB — HM DEXA SCAN: HM DEXA SCAN: NORMAL

## 2014-02-22 LAB — HEPATIC FUNCTION PANEL
ALBUMIN: 4 g/dL (ref 3.5–5.2)
ALT: 36 U/L — ABNORMAL HIGH (ref 0–35)
AST: 73 U/L — ABNORMAL HIGH (ref 0–37)
Alkaline Phosphatase: 74 U/L (ref 39–117)
Bilirubin, Direct: 0 mg/dL (ref 0.0–0.3)
TOTAL PROTEIN: 7.5 g/dL (ref 6.0–8.3)
Total Bilirubin: 0.5 mg/dL (ref 0.2–1.2)

## 2014-02-24 ENCOUNTER — Encounter: Payer: Self-pay | Admitting: Family Medicine

## 2014-03-24 ENCOUNTER — Encounter: Payer: Self-pay | Admitting: Internal Medicine

## 2014-03-24 ENCOUNTER — Ambulatory Visit (AMBULATORY_SURGERY_CENTER): Payer: Medicare Other | Admitting: Internal Medicine

## 2014-03-24 ENCOUNTER — Other Ambulatory Visit: Payer: Self-pay

## 2014-03-24 VITALS — BP 195/98 | HR 81 | Temp 98.2°F | Resp 37 | Ht 64.0 in | Wt 162.0 lb

## 2014-03-24 DIAGNOSIS — R1013 Epigastric pain: Secondary | ICD-10-CM

## 2014-03-24 DIAGNOSIS — K621 Rectal polyp: Secondary | ICD-10-CM

## 2014-03-24 DIAGNOSIS — R945 Abnormal results of liver function studies: Secondary | ICD-10-CM

## 2014-03-24 DIAGNOSIS — R7989 Other specified abnormal findings of blood chemistry: Secondary | ICD-10-CM

## 2014-03-24 DIAGNOSIS — R195 Other fecal abnormalities: Secondary | ICD-10-CM

## 2014-03-24 DIAGNOSIS — K219 Gastro-esophageal reflux disease without esophagitis: Secondary | ICD-10-CM | POA: Diagnosis not present

## 2014-03-24 DIAGNOSIS — D128 Benign neoplasm of rectum: Secondary | ICD-10-CM

## 2014-03-24 DIAGNOSIS — D129 Benign neoplasm of anus and anal canal: Secondary | ICD-10-CM

## 2014-03-24 DIAGNOSIS — R112 Nausea with vomiting, unspecified: Secondary | ICD-10-CM | POA: Diagnosis not present

## 2014-03-24 DIAGNOSIS — G2581 Restless legs syndrome: Secondary | ICD-10-CM | POA: Diagnosis not present

## 2014-03-24 DIAGNOSIS — R1084 Generalized abdominal pain: Secondary | ICD-10-CM

## 2014-03-24 MED ORDER — SODIUM CHLORIDE 0.9 % IV SOLN: 500.0000 mL | INTRAVENOUS | Status: DC

## 2014-03-24 NOTE — Progress Notes (Signed)
Called to room to assist during endoscopic procedure.  Patient ID and intended procedure confirmed with present staff. Received instructions for my participation in the procedure from the performing physician.  

## 2014-03-24 NOTE — Op Note (Signed)
[facility]  [address] [place], [id number]   ENDOSCOPY PROCEDURE REPORT  PATIENT: Valerie Ball, Valerie Ball  MR#: [id number] BIRTHDATE: Oct 19, 1945 , 64  yrs. old GENDER: female ENDOSCOPIST: Eustace Quail, MD REFERRED BY:  .  Self / Office PROCEDURE DATE:  03/24/2014 PROCEDURE:  EGD, diagnostic ASA CLASS:     Class II INDICATIONS:  epigastric abdominal pain. MEDICATIONS: Monitored anesthesia care and Propofol 100 mg IV TOPICAL ANESTHETIC: none  DESCRIPTION OF PROCEDURE: After the risks benefits and alternatives of the procedure were thoroughly explained, informed consent was obtained.  The LB [id number] endoscope was introduced through the mouth and advanced to the second portion of the duodenum , Without limitations.  The instrument was slowly withdrawn as the mucosa was fully examined.   EXAM: The esophagus and gastroesophageal junction were completely normal in appearance.  The stomach was entered and closely examined.The antrum, angularis, and lesser curvature were well visualized, including a retroflexed view of the cardia and fundus. The stomach wall was normally distensable. There were multiple small proximal gastric polyps consistent with benign fundic gland polyps.  The scope passed easily through the pylorus into the duodenum.  Retroflexed views revealed a hiatal hernia.     The scope was then withdrawn from the patient and the procedure completed.  COMPLICATIONS: There were no immediate complications.  ENDOSCOPIC IMPRESSION: 1. Normal upper endoscopy with small hiatal hernia noted  RECOMMENDATIONS: 1. Continue PPI 2. My office will arrange for MRCP to evaluate the bile duct "abdominal pain and elevated liver tests". 3. Office follow-up with Dr. Henrene Pastor thereafter to be determined  REPEAT EXAM:  eSigned:  Eustace Quail, MD 03/24/2014 3:49 PM    CC:The Patient and Abner Greenspan, MD

## 2014-03-24 NOTE — Progress Notes (Signed)
Report to PACU, RN, vss, BBS= Clear.  

## 2014-03-24 NOTE — Patient Instructions (Signed)
YOU HAD AN ENDOSCOPIC PROCEDURE TODAY AT THE [facility]: Refer to the procedure report that was given to you for any specific questions about what was found during the examination.  If the procedure report does not answer your questions, please call your gastroenterologist to clarify.  If you requested that your care partner not be given the details of your procedure findings, then the procedure report has been included in a sealed envelope for you to review at your convenience later.  YOU SHOULD EXPECT: Some feelings of bloating in the abdomen. Passage of more gas than usual.  Walking can help get rid of the air that was put into your GI tract during the procedure and reduce the bloating. If you had a lower endoscopy (such as a colonoscopy or flexible sigmoidoscopy) you may notice spotting of blood in your stool or on the toilet paper. If you underwent a bowel prep for your procedure, then you may not have a normal bowel movement for a few days.  DIET: Your first meal following the procedure should be a light meal and then it is ok to progress to your normal diet.  A half-sandwich or bowl of soup is an example of a good first meal.  Heavy or fried foods are harder to digest and may make you feel nauseous or bloated.  Likewise meals heavy in dairy and vegetables can cause extra gas to form and this can also increase the bloating.  Drink plenty of fluids but you should avoid alcoholic beverages for 24 hours.  ACTIVITY: Your care partner should take you home directly after the procedure.  You should plan to take it easy, moving slowly for the rest of the day.  You can resume normal activity the day after the procedure however you should NOT DRIVE or use heavy machinery for 24 hours (because of the sedation medicines used during the test).    SYMPTOMS TO REPORT IMMEDIATELY: A gastroenterologist can be reached at any hour.  During normal business hours, 8:30 AM to 5:00 PM Monday through Friday,  call [phone].  After hours and on weekends, please call the GI answering service at [phone] who will take a message and have the physician on call contact you.   Following lower endoscopy (colonoscopy or flexible sigmoidoscopy):  Excessive amounts of blood in the stool  Significant tenderness or worsening of abdominal pains  Swelling of the abdomen that is new, acute  Fever of 100F or higher  Following upper endoscopy (EGD)  Vomiting of blood or coffee ground material  New chest pain or pain under the shoulder blades  Painful or persistently difficult swallowing  New shortness of breath  Fever of 100F or higher  Black, tarry-looking stools  FOLLOW UP: If any biopsies were taken you will be contacted by phone or by letter within the next 1-3 weeks.  Call your gastroenterologist if you have not heard about the biopsies in 3 weeks.  Our staff will call the home number listed on your records the next business day following your procedure to check on you and address any questions or concerns that you may have at that time regarding the information given to you following your procedure. This is a courtesy call and so if there is no answer at the home number and we have not heard from you through the emergency physician on call, we will assume that you have returned to your regular daily activities without incident.  SIGNATURES/CONFIDENTIALITY: You and/or your care   partner have signed paperwork which will be entered into your electronic medical record.  These signatures attest to the fact that that the information above on your After Visit Summary has been reviewed and is understood.  Full responsibility of the confidentiality of this discharge information lies with you and/or your care-partner.  Polyp, diverticulosis, and high fiber diet information given.  Hiatal hernia information given.

## 2014-03-24 NOTE — Op Note (Signed)
[place]  [employer] [place], [id number]   COLONOSCOPY PROCEDURE REPORT  PATIENT: Valerie Ball, Valerie Ball  MR#: [id number] BIRTHDATE: Jun 20, 1945 , 68  yrs. old GENDER: female ENDOSCOPIST: Eustace Quail, MD REFERRED BY:.  Self / Office PROCEDURE DATE:  03/24/2014 PROCEDURE:   Colonoscopy with snare polypectomy x 1 First Screening Colonoscopy - Avg.  risk and is 50 yrs.  old or older - No.  Prior Negative Screening - Now for repeat screening. N/A  History of Adenoma - Now for follow-up colonoscopy & has been > or = to 3 yrs.  N/A  Polyps Removed Today? Yes. ASA CLASS:   Class II INDICATIONS:heme-positive stool and abdominal pain.Also,possible cecal polyp on virtual colonoscopy 2013 MEDICATIONS: Monitored anesthesia care and Propofol 300 mg IV  DESCRIPTION OF PROCEDURE:   After the risks benefits and alternatives of the procedure were thoroughly explained, informed consent was obtained.  The digital rectal exam revealed no abnormalities of the rectum.   The LB [id number]  endoscope was introduced through the anus and advanced to the cecum, which was identified by both the appendix and ileocecal valve. No adverse events experienced.   The quality of the prep was excellent, using MoviPrep  The instrument was then slowly withdrawn as the colon was fully examined.  COLON FINDINGS: A single polyp measuring 5 mm in size was found in the rectum.  A polypectomy was performed with a cold snare.  The resection was complete, the polyp tissue was completely retrieved and sent to histology.   There was moderate diverticulosis in the sigmoid colon, with stenosis.   Multiple angiodysplastic lesions were found in the ascending colon and at the cecum. These measured 3-6 mm and were nonbleeding.   The examination was otherwise normal.  Retroflexed views revealed internal hemorrhoids. The time to cecum=4 minutes 04 seconds.  Withdrawal time=12 minutes 58 seconds.  The scope  was withdrawn and the procedure completed. COMPLICATIONS: There were no immediate complications.  ENDOSCOPIC IMPRESSION: 1.   Single polyp measuring 5 mm in size was found in the rectum; polypectomy was performed with a cold snare 2.   Moderate diverticulosis was noted in the sigmoid colon , with stenosis 3.   Angiodysplastic lesion in the ascending colon and at the cecum 4.   The examination was otherwise normal  RECOMMENDATIONS: 1.  Repeat colonoscopy in 5 years if polyp adenomatous; otherwise 10 years (pediatric colonoscope) 2.  Upper endoscopy today (please see report)  eSigned:  Eustace Quail, MD 03/24/2014 3:43 PM   cc: Abner Greenspan, MD and The Patient

## 2014-03-25 ENCOUNTER — Telehealth: Payer: Self-pay | Admitting: *Deleted

## 2014-03-25 NOTE — Telephone Encounter (Signed)
  Follow up Call-  Call back number 03/24/2014 11/03/2011  Post procedure Call Back phone  # [phone]  Permission to leave phone message Yes Yes     Patient questions:  Do you have a fever, pain , or abdominal swelling? No. Pain Score  0 *  Have you tolerated food without any problems? Yes.    Have you been able to return to your normal activities? Yes.    Do you have any questions about your discharge instructions: Diet   No. Medications  No. Follow up visit  No.  Do you have questions or concerns about your Care? No.  Actions: * If pain score is 4 or above: No action needed, pain <4.

## 2014-03-26 ENCOUNTER — Telehealth: Payer: Self-pay

## 2014-03-26 DIAGNOSIS — R1084 Generalized abdominal pain: Secondary | ICD-10-CM

## 2014-03-26 MED ORDER — ALPRAZOLAM 0.25 MG PO TABS: ORAL_TABLET | ORAL | Status: DC

## 2014-03-26 NOTE — Telephone Encounter (Signed)
Prescribed Xanax 0.25 mg. Dispense 1. Take 20 minutes prior to MR exam. She should have somebody drive her at any point that the after she takes this medication.

## 2014-03-26 NOTE — Telephone Encounter (Signed)
Spoke with pt and she is aware, script sent to pharmacy. 

## 2014-03-26 NOTE — Telephone Encounter (Signed)
Pt scheduled for MRCP at [facility] 04/10/14@9am , pt to arrive there at 8:45am. Pt to be NPO after midnight. Pt needs to come in for labs prior to exam. Left message for pt to call back.   Pt aware of appt but states she is claustrophobic and would like something to help her relax prior to the MRCP. Dr. Henrene Pastor please advise.

## 2014-03-30 ENCOUNTER — Encounter: Payer: Self-pay | Admitting: Internal Medicine

## 2014-04-10 ENCOUNTER — Ambulatory Visit (HOSPITAL_COMMUNITY): Admission: RE | Admit: 2014-04-10 | Payer: Medicare Other | Source: Ambulatory Visit

## 2014-04-15 ENCOUNTER — Telehealth: Payer: Self-pay | Admitting: Family Medicine

## 2014-04-15 DIAGNOSIS — R739 Hyperglycemia, unspecified: Secondary | ICD-10-CM

## 2014-04-15 DIAGNOSIS — I10 Essential (primary) hypertension: Secondary | ICD-10-CM

## 2014-04-15 DIAGNOSIS — E785 Hyperlipidemia, unspecified: Secondary | ICD-10-CM

## 2014-04-15 NOTE — Telephone Encounter (Signed)
-----   Message from Ellamae Sia sent at 04/14/2014  3:48 PM EST ----- Regarding: Lab orders for Thursday, 1.28.16 Patient is scheduled for CPX labs, please order future labs, Thanks , Karna Christmas

## 2014-04-16 ENCOUNTER — Other Ambulatory Visit: Payer: Self-pay | Admitting: Internal Medicine

## 2014-04-16 ENCOUNTER — Ambulatory Visit (HOSPITAL_COMMUNITY)
Admission: RE | Admit: 2014-04-16 | Discharge: 2014-04-16 | Disposition: A | Discharge status: Home / Self Care | Payer: Medicare Other | Source: Ambulatory Visit | Attending: Internal Medicine | Admitting: Internal Medicine

## 2014-04-16 ENCOUNTER — Other Ambulatory Visit (INDEPENDENT_AMBULATORY_CARE_PROVIDER_SITE_OTHER): Payer: Medicare Other

## 2014-04-16 DIAGNOSIS — E785 Hyperlipidemia, unspecified: Secondary | ICD-10-CM

## 2014-04-16 DIAGNOSIS — R739 Hyperglycemia, unspecified: Secondary | ICD-10-CM | POA: Diagnosis not present

## 2014-04-16 DIAGNOSIS — R1084 Generalized abdominal pain: Secondary | ICD-10-CM | POA: Diagnosis not present

## 2014-04-16 DIAGNOSIS — I10 Essential (primary) hypertension: Secondary | ICD-10-CM | POA: Diagnosis not present

## 2014-04-16 DIAGNOSIS — R7989 Other specified abnormal findings of blood chemistry: Secondary | ICD-10-CM | POA: Diagnosis not present

## 2014-04-16 DIAGNOSIS — R945 Abnormal results of liver function studies: Secondary | ICD-10-CM

## 2014-04-16 DIAGNOSIS — N281 Cyst of kidney, acquired: Secondary | ICD-10-CM | POA: Diagnosis not present

## 2014-04-16 DIAGNOSIS — K760 Fatty (change of) liver, not elsewhere classified: Secondary | ICD-10-CM | POA: Diagnosis not present

## 2014-04-16 LAB — COMPREHENSIVE METABOLIC PANEL
ALT: 27 U/L (ref 0–35)
AST: 51 U/L — AB (ref 0–37)
Albumin: 3.8 g/dL (ref 3.5–5.2)
Alkaline Phosphatase: 86 U/L (ref 39–117)
BUN: 17 mg/dL (ref 6–23)
CO2: 26 mEq/L (ref 19–32)
CREATININE: 0.71 mg/dL (ref 0.40–1.20)
Calcium: 8.9 mg/dL (ref 8.4–10.5)
Chloride: 106 mEq/L (ref 96–112)
GFR: 87.36 mL/min (ref >60.00)
GLUCOSE: 150 mg/dL — AB (ref 70–99)
Potassium: 4.1 mEq/L (ref 3.5–5.1)
Sodium: 142 mEq/L (ref 135–145)
Total Bilirubin: 0.4 mg/dL (ref 0.2–1.2)
Total Protein: 7.2 g/dL (ref 6.0–8.3)

## 2014-04-16 LAB — CBC WITH DIFFERENTIAL/PLATELET
Basophils Absolute: 0 10*3/uL (ref 0.0–0.1)
Basophils Relative: 0.3 % (ref 0.0–3.0)
EOS PCT: 1.6 % (ref 0.0–5.0)
Eosinophils Absolute: 0.1 10*3/uL (ref 0.0–0.7)
HEMATOCRIT: 35.5 % — AB (ref 36.0–46.0)
HEMOGLOBIN: 11.9 g/dL — AB (ref 12.0–15.0)
Lymphocytes Relative: 29.5 % (ref 12.0–46.0)
Lymphs Abs: 1.6 10*3/uL (ref 0.7–4.0)
MCHC: 33.6 g/dL (ref 30.0–36.0)
MCV: 83.8 fl (ref 78.0–100.0)
Monocytes Absolute: 0.4 10*3/uL (ref 0.1–1.0)
Monocytes Relative: 7.4 % (ref 3.0–12.0)
NEUTROS PCT: 61.2 % (ref 43.0–77.0)
Neutro Abs: 3.3 10*3/uL (ref 1.4–7.7)
Platelets: 101 10*3/uL — ABNORMAL LOW (ref 150.0–400.0)
RBC: 4.24 Mil/uL (ref 3.87–5.11)
RDW: 15.9 % — ABNORMAL HIGH (ref 11.5–15.5)
WBC: 5.3 10*3/uL (ref 4.0–10.5)

## 2014-04-16 LAB — LIPID PANEL
CHOL/HDL RATIO: 5
CHOLESTEROL: 138 mg/dL (ref 0–200)
HDL: 26 mg/dL — ABNORMAL LOW (ref >39.00)
NonHDL: 112
Triglycerides: 272 mg/dL — ABNORMAL HIGH (ref 0.0–149.0)
VLDL: 54.4 mg/dL — ABNORMAL HIGH (ref 0.0–40.0)

## 2014-04-16 LAB — LDL CHOLESTEROL, DIRECT: Direct LDL: 84 mg/dL

## 2014-04-16 LAB — TSH: TSH: 7.62 u[IU]/mL — AB (ref 0.35–4.50)

## 2014-04-16 LAB — POCT I-STAT CREATININE: Creatinine, Ser: 0.7 mg/dL (ref 0.50–1.10)

## 2014-04-16 LAB — HEMOGLOBIN A1C: Hgb A1c MFr Bld: 7.3 % — ABNORMAL HIGH (ref 4.6–6.5)

## 2014-04-16 MED ORDER — GADOBENATE DIMEGLUMINE 529 MG/ML IV SOLN
15.0000 mL | Freq: Once | INTRAVENOUS | Status: AC | PRN
  Administered 2014-04-16: 15 mL via INTRAVENOUS

## 2014-04-21 ENCOUNTER — Ambulatory Visit (INDEPENDENT_AMBULATORY_CARE_PROVIDER_SITE_OTHER): Payer: Medicare Other | Admitting: Family Medicine

## 2014-04-21 ENCOUNTER — Encounter: Payer: Self-pay | Admitting: Family Medicine

## 2014-04-21 VITALS — BP 162/82 | HR 64 | Temp 98.5°F | Ht 64.0 in | Wt 162.4 lb

## 2014-04-21 DIAGNOSIS — E039 Hypothyroidism, unspecified: Secondary | ICD-10-CM

## 2014-04-21 DIAGNOSIS — K760 Fatty (change of) liver, not elsewhere classified: Secondary | ICD-10-CM

## 2014-04-21 DIAGNOSIS — E785 Hyperlipidemia, unspecified: Secondary | ICD-10-CM | POA: Diagnosis not present

## 2014-04-21 DIAGNOSIS — E119 Type 2 diabetes mellitus without complications: Secondary | ICD-10-CM

## 2014-04-21 DIAGNOSIS — I10 Essential (primary) hypertension: Secondary | ICD-10-CM | POA: Diagnosis not present

## 2014-04-21 MED ORDER — GLIPIZIDE ER 5 MG PO TB24: 5.0000 mg | ORAL_TABLET | Freq: Every day | ORAL | Status: DC

## 2014-04-21 MED ORDER — LEVOTHYROXINE SODIUM 25 MCG PO CAPS: 1.0000 | ORAL_CAPSULE | Freq: Every day | ORAL | Status: DC

## 2014-04-21 NOTE — Patient Instructions (Signed)
Start glipizide 5 mg once daily with breakfast  Watch sugar - we need to watch out for glucose under 70 or feeling hypoglycemic  If problems let me know Also start levothyroxine (thyroid supplment )- in am 1/2 hour before other medicines  Follow up in 3 months with labs prior  We will likely change/add bp medicine - but keep working out for now and see how much we can bring it down with that If you are interested in a shingles/zoster vaccine - call your insurance to check on coverage,( you should not get it within 1 month of other vaccines) , then call us for a prescription  for it to take to a pharmacy that gives the shot , or make a nurse visit to get it here depending on your coverage

## 2014-04-21 NOTE — Progress Notes (Signed)
Pre visit review using our clinic review tool, if applicable. No additional management support is needed unless otherwise documented below in the visit note. 

## 2014-04-21 NOTE — Progress Notes (Signed)
Subjective:    Patient ID: Valerie Ball, female    DOB: 1945/06/08, 69 y.o.   MRN: [id number]  HPI Here for f/u of chronic medical problems  Wt is stable with bmi of 27  She has started to go back to the gym 3 visits so far  Diet is not great - she does avoid sweets but eating too much starch/bread  (? If she has control over it) Cooking for her husband too much    HTN  BP Readings from Last 3 Encounters:  04/21/14 162/82  03/24/14 195/98  02/18/14 134/70   these have been up  ? If up at home  A lot of doctor visits  No ha or other symptoms   Hyperglycemia Lab Results  Component Value Date   HGBA1C 7.3* 04/16/2014  she has not been to DM classes  This is up from 7 Cannot take metformin   Hx of fatty liver Lab Results  Component Value Date   ALT 27 04/16/2014   AST 51* 04/16/2014   ALKPHOS 86 04/16/2014   BILITOT 0.4 04/16/2014  found fatty liver on recent GI testing - no other findings   Lab Results  Component Value Date   WBC 5.3 04/16/2014   HGB 11.9* 04/16/2014   HCT 35.5* 04/16/2014   MCV 83.8 04/16/2014   PLT 101.0* 04/16/2014   has seen GI  Had blood in stool - has been worked up-no source   Hyperlipidemia Lab Results  Component Value Date   CHOL 138 04/16/2014   CHOL 155 12/31/2013   CHOL 184 11/29/2012   Lab Results  Component Value Date   HDL 26.00* 04/16/2014   HDL 21.30* 12/31/2013   HDL 32.20* 11/29/2012   No results found for: [facility] Lab Results  Component Value Date   TRIG 272.0* 04/16/2014   TRIG 299.0* 12/31/2013   TRIG 265.0* 11/29/2012   Lab Results  Component Value Date   CHOLHDL 5 04/16/2014   CHOLHDL 7 12/31/2013   CHOLHDL 6 11/29/2012   Lab Results  Component Value Date   LDLDIRECT 84.0 04/16/2014   LDLDIRECT 81.1 12/31/2013   LDLDIRECT 117.3 11/29/2012   getting back to the gym  zetia currently   tsh was up slt Lab Results  Component Value Date   TSH 7.62* 04/16/2014   she has been tired lately     Stress with her husband has been a little better  Working on things at home   Patient Active Problem List   Diagnosis Date Noted  . Heme + stool 02/18/2014  . Nausea without vomiting 02/18/2014  . Elevated LFTs 02/18/2014  . Occult blood positive stool 02/03/2014  . Encounter for Medicare annual wellness exam 01/23/2014  . Estrogen deficiency 01/23/2014  . Colon cancer screening 01/23/2014  . Abdominal pain, epigastric 01/20/2014  . Tremor 01/20/2014  . RUQ abdominal pain 01/20/2014  . Dizziness 01/07/2014  . Allergic rhinitis 01/07/2014  . Headache above the eye region 01/07/2014  . Thrombocytopenia, unspecified 11/25/2013  . Diabetic neuropathy 11/25/2013  . History of breast cancer 06/07/2012  . Lap Nissen October 2013 01/19/2012  . Large type III mixed hiatus hernia  12/06/2011  . Stress reaction 10/25/2011  . Routine general medical examination at a health care facility 07/11/2011  . ADVERSE DRUG REACTION 05/05/2010  . TRANSAMINASES, SERUM, ELEVATED 11/11/2009  . BREAST CANCER 08/02/2009  . DIVERTICULOSIS-COLON 03/03/2008  . ESOPHAGEAL STRICTURE 03/02/2008  . HIATAL HERNIA 03/02/2008  . PERITONEAL ADHESIONS 03/02/2008  .  Anemia 02/07/2008  . Hyperglycemia 08/19/2007  . ANXIETY 04/15/2007  . DISORDERS, ORGANIC SLEEP NEC 08/23/2006  . SYNDROME, RESTLESS LEGS 08/23/2006  . SYNDROME, CARPAL TUNNEL 08/23/2006  . Hyperlipidemia LDL goal <100 08/22/2006  . Essential hypertension 08/22/2006  . ALLERGIC RHINITIS 08/22/2006  . GERD 08/22/2006  . Fatty liver 08/22/2006  . SEIZURE DISORDER 08/22/2006   Past Medical History  Diagnosis Date  . Allergy     allegic rhinitis  . Anxiety   . GERD (gastroesophageal reflux disease)   . Hypertension   . Hyperlipidemia   . Seizure disorder   . RLS (restless legs syndrome)   . Arthritis   . Diverticulosis   . GI bleed   . Cataract   . Salmonella 05/2000    renal effects secondary to dehydration  . Hiatal hernia   .  Melena 01/2008    anemia transfusion  . Esophageal stricture 02/04/2008  . Borderline diabetic     per patient medical history form  . Menopause     per patient medical history form  . Postmenopausal hormone therapy     per patient medical history form.  . Blood transfusion   . Osteoporosis   . Colon stricture 01/14/2003  . Diabetes mellitus     pt's medical doctor took her off diabetic meds-blood sugars have not been a problem--and medications were causing pt's sugars to be too low  . Seizures     one seizure several yrs ago --etiology unknown-no problem since  . Anemia   . Breast cancer 02/2009    breast CA L invasive ductal CA(hormone receptor positive.)   Past Surgical History  Procedure Laterality Date  . Abdominal hysterectomy  1983  . Ovary surgery  1986    ovarian tumor removed  . Mastectomy  04/2009    Bilateral for ductal carcinoma on L  . Breast surgery  02/2009    breast biopsy invasive ductal CA-bilateral mastectomies  . Epigastric hernia repair  01/02/2012    Procedure: HERNIA REPAIR EPIGASTRIC ADULT;  Surgeon: Pedro Earls, MD;  Location: [facility];  Service: General;  Laterality: N/A;  Laparoscopic Repair Paraesophageal Hernia, Nissen  . Laparoscopic nissen fundoplication  82/70/7867    Procedure: LAPAROSCOPIC NISSEN FUNDOPLICATION;  Surgeon: Pedro Earls, MD;  Location: [facility];  Service: General;  Laterality: N/A;   History  Substance Use Topics  . Smoking status: Never Smoker   . Smokeless tobacco: Never Used  . Alcohol Use: No   Family History  Problem Relation Age of Onset  . Heart attack Father   . Esophageal cancer Father   . Diabetes Father   . Cancer Father     esophageal  . Breast cancer Sister   . Diabetes Sister   . Cancer Sister     breast  . Hypertension Brother   . Diabetes Brother   . Breast cancer Sister   . Cancer Sister     breast  . Hypertension Mother   . Stroke Mother   . Breast cancer Mother   . Cancer Mother      breast  . Hypertension Sister    Allergies  Allergen Reactions  . Arimidex [Anastrozole]     Joint pain  . Metformin And Related     diarrhea  . Penicillins Rash   Current Outpatient Prescriptions on File Prior to Visit  Medication Sig Dispense Refill  . ALPRAZolam (XANAX) 0.25 MG tablet Take tablet 20 minutes before MRCP 1 tablet 0  .  Cholecalciferol (VITAMIN D-3 PO) Take 1,000 mg by mouth daily.     . enalapril (VASOTEC) 20 MG tablet TAKE 1 TABLET (20 MG TOTAL) BY MOUTH AT BEDTIME. 90 tablet 1  . fluticasone (FLONASE) 50 MCG/ACT nasal spray Place 2 sprays into both nostrils daily as needed. Allergies 16 g 11  . Multiple Vitamin (MULTIVITAMIN WITH MINERALS) TABS tablet Take 1 tablet by mouth daily.    Marland Kitchen omeprazole (PRILOSEC) 20 MG capsule Take 1 pill by mouth twice daily 180 capsule 1  . ondansetron (ZOFRAN ODT) 8 MG disintegrating tablet Take 1 tablet (8 mg total) by mouth every 8 (eight) hours as needed for nausea or vomiting. 20 tablet 0  . rOPINIRole (REQUIP) 1 MG tablet TAKE 1 TABLET (1 MG TOTAL) BY MOUTH AT BEDTIME. 90 tablet 1  . sertraline (ZOLOFT) 100 MG tablet TAKE 1 TABLET (100 MG TOTAL) BY MOUTH AT BEDTIME. 90 tablet 1  . sucralfate (CARAFATE) 1 G tablet Take 1 tablet (1 g total) by mouth 4 (four) times daily -  before meals and at bedtime. 120 tablet 0  . tamoxifen (NOLVADEX) 20 MG tablet TAKE 1 TABLET (20 MG TOTAL) BY MOUTH DAILY. 30 tablet 6  . ZETIA 10 MG tablet TAKE 1 TABLET (10 MG TOTAL) BY MOUTH DAILY. 90 tablet 1  . [DISCONTINUED] cetirizine (ZYRTEC) 10 MG tablet Take 10 mg by mouth daily as needed. For allergy symptoms    . [DISCONTINUED] hydrochlorothiazide 25 MG tablet Take 25 mg by mouth daily.       No current facility-administered medications on file prior to visit.      Review of Systems  Review of Systems  Constitutional: Negative for fever, appetite change,  and unexpected weight change. pos for fatigue  Eyes: Negative for pain and visual disturbance.   Respiratory: Negative for cough and shortness of breath.   Cardiovascular: Negative for cp or palpitations    Gastrointestinal: Negative for nausea, diarrhea and constipation.  Genitourinary: Negative for urgency and frequency.  Skin: Negative for pallor or rash   Neurological: Negative for weakness, light-headedness, numbness and headaches.  Hematological: Negative for adenopathy. Does not bruise/bleed easily.  Psychiatric/Behavioral: Negative for dysphoric mood. Pos for anxiety and stressors        Objective:   Physical Exam  Constitutional: She appears well-developed and well-nourished. No distress.  obese and well appearing   HENT:  Head: Normocephalic and atraumatic.  Mouth/Throat: Oropharynx is clear and moist.  Eyes: Conjunctivae and EOM are normal. Pupils are equal, round, and reactive to light. No scleral icterus.  Neck: Normal range of motion. Neck supple. No JVD present. Carotid bruit is not present. No thyromegaly present.  Cardiovascular: Normal rate, regular rhythm, normal heart sounds and intact distal pulses.  Exam reveals no gallop.   Pulmonary/Chest: Effort normal and breath sounds normal. No respiratory distress. She has no wheezes. She has no rales.  Abdominal: Soft. Bowel sounds are normal. She exhibits no distension, no abdominal bruit and no mass. There is no tenderness. There is no rebound.  Musculoskeletal: She exhibits no edema.  Lymphadenopathy:    She has no cervical adenopathy.  Neurological: She is alert. She has normal reflexes. No cranial nerve deficit. She exhibits normal muscle tone. Coordination normal.  Skin: Skin is warm and dry. No rash noted. No erythema. No pallor.  Psychiatric: She has a normal mood and affect.          Assessment & Plan:   Problem List Items Addressed This Visit  Cardiovascular and Mediastinum   Essential hypertension - Primary    bp not at goal  Also treating thyroid and worsened DM  Rev DASH diet and  lifestyle change F/u planned  Will change/add med at that time if not at goal   BP Readings from Last 1 Encounters:  04/21/14 162/82   No changes needed Disc lifstyle change with low sodium diet and exercise          Digestive   Fatty liver    Lab Results  Component Value Date   ALT 27 04/16/2014   AST 51* 04/16/2014   ALKPHOS 86 04/16/2014   BILITOT 0.4 04/16/2014   Disc imp of low fat diet and wt loss  Will continue to monitor        Endocrine   Hypothyroidism    No goiter or hx of tx in the past  Some fatigue Will start on low dose levothyroxine F/u lab and f/u planned       Relevant Medications   Levothyroxine Sodium 25 MCG CAPS     Other   Hyperglycemia    Lab Results  Component Value Date   HGBA1C 7.3* 04/16/2014   This is in the DM range now  Will add glipizide xl 5 mg-disc risk of hypoglycemia and symptoms Will hold med and update if problems Rev low glycemic diet and need for exercise and wt loss  F/u 3 mo       Hyperlipidemia LDL goal <100    Intol of statins, on zetia Disc goals for lipids and reasons to control them Rev labs with pt Rev low sat fat diet in detail HDL is much too low -disc omega 3 supple and exercise to help

## 2014-04-22 ENCOUNTER — Telehealth: Payer: Self-pay | Admitting: Family Medicine

## 2014-04-22 NOTE — Telephone Encounter (Signed)
emmi emailed °

## 2014-04-23 DIAGNOSIS — E039 Hypothyroidism, unspecified: Secondary | ICD-10-CM | POA: Insufficient documentation

## 2014-04-23 NOTE — Assessment & Plan Note (Signed)
Intol of statins, on zetia Disc goals for lipids and reasons to control them Rev labs with pt Rev low sat fat diet in detail HDL is much too low -disc omega 3 supple and exercise to help

## 2014-04-23 NOTE — Assessment & Plan Note (Signed)
Lab Results  Component Value Date   ALT 27 04/16/2014   AST 51* 04/16/2014   ALKPHOS 86 04/16/2014   BILITOT 0.4 04/16/2014   Disc imp of low fat diet and wt loss  Will continue to monitor

## 2014-04-23 NOTE — Assessment & Plan Note (Signed)
No goiter or hx of tx in the past  Some fatigue Will start on low dose levothyroxine F/u lab and f/u planned

## 2014-04-23 NOTE — Assessment & Plan Note (Signed)
bp not at goal  Also treating thyroid and worsened DM  Rev DASH diet and lifestyle change F/u planned  Will change/add med at that time if not at goal   BP Readings from Last 1 Encounters:  04/21/14 162/82   No changes needed Disc lifstyle change with low sodium diet and exercise

## 2014-04-23 NOTE — Assessment & Plan Note (Signed)
Lab Results  Component Value Date   HGBA1C 7.3* 04/16/2014   This is in the DM range now  Will add glipizide xl 5 mg-disc risk of hypoglycemia and symptoms Will hold med and update if problems Rev low glycemic diet and need for exercise and wt loss  F/u 3 mo

## 2014-04-24 ENCOUNTER — Other Ambulatory Visit: Payer: Self-pay | Admitting: Family Medicine

## 2014-04-27 ENCOUNTER — Ambulatory Visit: Payer: Medicare Other | Admitting: Family Medicine

## 2014-05-01 ENCOUNTER — Telehealth: Payer: Self-pay

## 2014-05-01 NOTE — Telephone Encounter (Signed)
pts ins will not cover accuchek and ins will cover free style lite meter, test strips and lancets; pt request free style supplies to go to [facility]. Pt is going out of town on 05/03/14 to return on 05/11/14 and pt is out of other test strips; pt request done to do and cb when done.

## 2014-05-01 NOTE — Telephone Encounter (Signed)
[facility] to [facility] and notified patient as well.

## 2014-05-01 NOTE — Telephone Encounter (Signed)
Hand written px is in the IN box

## 2014-05-12 ENCOUNTER — Other Ambulatory Visit: Payer: Self-pay | Admitting: Family Medicine

## 2014-05-13 ENCOUNTER — Other Ambulatory Visit: Payer: Self-pay | Admitting: *Deleted

## 2014-05-13 MED ORDER — ENALAPRIL MALEATE 20 MG PO TABS: ORAL_TABLET | ORAL | Status: DC

## 2014-05-13 NOTE — Telephone Encounter (Signed)
Please refill all for a year if possible

## 2014-05-13 NOTE — Telephone Encounter (Signed)
[facility] to [facility] as instructed.

## 2014-05-13 NOTE — Addendum Note (Signed)
Addended by: Jacqualin Combes on: 05/13/2014 02:34 PM   Modules accepted: Orders, Medications

## 2014-05-13 NOTE — Telephone Encounter (Signed)
Ok to refill? Next follow up on 07/22/14.

## 2014-05-19 ENCOUNTER — Encounter: Payer: Self-pay | Admitting: Internal Medicine

## 2014-05-19 ENCOUNTER — Ambulatory Visit (INDEPENDENT_AMBULATORY_CARE_PROVIDER_SITE_OTHER): Payer: Medicare Other | Admitting: Internal Medicine

## 2014-05-19 VITALS — BP 130/70 | HR 88 | Ht 64.0 in | Wt 160.0 lb

## 2014-05-19 DIAGNOSIS — R7989 Other specified abnormal findings of blood chemistry: Secondary | ICD-10-CM | POA: Diagnosis not present

## 2014-05-19 DIAGNOSIS — K5520 Angiodysplasia of colon without hemorrhage: Secondary | ICD-10-CM | POA: Diagnosis not present

## 2014-05-19 DIAGNOSIS — R945 Abnormal results of liver function studies: Secondary | ICD-10-CM

## 2014-05-19 DIAGNOSIS — R195 Other fecal abnormalities: Secondary | ICD-10-CM

## 2014-05-19 DIAGNOSIS — R1013 Epigastric pain: Secondary | ICD-10-CM

## 2014-05-19 DIAGNOSIS — K219 Gastro-esophageal reflux disease without esophagitis: Secondary | ICD-10-CM | POA: Diagnosis not present

## 2014-05-19 NOTE — Progress Notes (Signed)
HISTORY OF PRESENT ILLNESS:  Valerie Ball is a 69 y.o. female with multiple medical problems as listed below. She presents today for follow-up regarding ongoing management of abdominal pain and abnormal liver tests. As well, follow-up of interval imaging. Previous patient of Dr. Verl Blalock. Evaluated by the physician assistant 02/18/2014 regarding Hemoccult-positive stool, epigastric pain with nausea and weight loss, and elevated liver tests. She does have a history of GERD with large hiatal hernia for which she underwent Nissen fundoplication October 1610. Recent CT scan revealed recurrence of hernia. Previous difficulties completing colonoscopy. However, she did undergo successful colonoscopy along with upper endoscopy 03/24/2014. Colonoscopy revealed a diminutive hyperplastic polyp, sigmoid diverticulosis with stenosis, and angiodysplastic lesion in the ascending colon and cecum. Upper endoscopy revealed the hiatal hernia without complication or other issues. She was continued on PPI. MRCP was then scheduled and performed 04/16/2014. No significant abnormalities noted. Changes consistent with fatty liver present. Follow-up liver tests performed 04/16/2014 or improved. Normal except for AST of 51. Since her evaluation she reports feeling better. Occasional mild epigastric discomfort. Nausea has resolved. Weight has stabilized. She does have some bloating and gas.   REVIEW OF SYSTEMS:  All non-GI ROS negative except for Anxiety, arthritis, fatigue, sinus and allergy, night sweats, muscle crams, sleeping problems, increased thirst, excessive urination   Past Medical History  Diagnosis Date  . Allergy     allegic rhinitis  . Anxiety   . GERD (gastroesophageal reflux disease)   . Hypertension   . Hyperlipidemia   . Seizure disorder   . RLS (restless legs syndrome)   . Arthritis   . Diverticulosis   . GI bleed   . Cataract   . Salmonella 05/2000    renal effects secondary to dehydration   . Hiatal hernia   . Melena 01/2008    anemia transfusion  . Esophageal stricture 02/04/2008  . Borderline diabetic     per patient medical history form  . Menopause     per patient medical history form  . Postmenopausal hormone therapy     per patient medical history form.  . Blood transfusion   . Osteoporosis   . Colon stricture 01/14/2003  . Diabetes mellitus     pt's medical doctor took her off diabetic meds-blood sugars have not been a problem--and medications were causing pt's sugars to be too low  . Seizures     one seizure several yrs ago --etiology unknown-no problem since  . Anemia   . Breast cancer 02/2009    breast CA L invasive ductal CA(hormone receptor positive.)  . Colon polyp     hyperplastic  . Hepatic steatosis     Past Surgical History  Procedure Laterality Date  . Abdominal hysterectomy  1983  . Ovary surgery  1986    ovarian tumor removed  . Mastectomy  04/2009    Bilateral for ductal carcinoma on L  . Breast surgery  02/2009    breast biopsy invasive ductal CA-bilateral mastectomies  . Epigastric hernia repair  01/02/2012    Procedure: HERNIA REPAIR EPIGASTRIC ADULT;  Surgeon: Pedro Earls, MD;  Location: [facility];  Service: General;  Laterality: N/A;  Laparoscopic Repair Paraesophageal Hernia, Nissen  . Laparoscopic nissen fundoplication  96/06/5407    Procedure: LAPAROSCOPIC NISSEN FUNDOPLICATION;  Surgeon: Pedro Earls, MD;  Location: [facility];  Service: General;  Laterality: N/A;    Social History [name]  reports that she has never smoked. She has never used smokeless  tobacco. She reports that she does not drink alcohol or use illicit drugs.  family history includes Breast cancer in her mother, sister, and sister; Cancer in her father, mother, sister, and sister; Diabetes in her brother, father, and sister; Esophageal cancer in her father; Heart attack in her father; Hypertension in her brother, mother, and sister; Stroke in her  mother.  Allergies  Allergen Reactions  . Arimidex [Anastrozole]     Joint pain  . Metformin And Related     diarrhea  . Penicillins Rash       PHYSICAL EXAMINATION: Vital signs: BP 130/70 mmHg  Pulse 88  Ht 5\' 4"  (1.626 m)  Wt 160 lb (72.576 kg)  BMI 27.45 kg/m2  LMP 03/20/1981 General: Well-developed, well-nourished, no acute distress HEENT: Sclerae are anicteric, conjunctiva pink. Oral mucosa intact Lungs: Clear Heart: Regular Abdomen: soft, nontender, nondistended, no obvious ascites, no peritoneal signs, normal bowel sounds. No organomegaly. Extremities: No edema Psychiatric: alert and oriented x3. Cooperative   ASSESSMENT:  #1. Epigastric discomfort. Likely related to GERD and hiatal hernia. Improved. #2. Mild elevation of liver tests. Likely secondary to fatty liver. Last set of liver enzymes improved. Negative MRCP #3. GERD. Ongoing #4. Colonic angiodysplasia. Potential cause for Hemoccult-positive stool   PLAN:  #1. Reflux precautions again reviewed #2. Continue omeprazole 20 mg daily. Refilled as needed #3. Weight loss with periodic liver tests  #4. Exercise and weight loss #5. Periodic CBC. If patient were to develop anemia, recommend initiation of iron replacement therapy #6. GI office follow-up in 1 year. #7. Routine repeat colonoscopy 10 years  20 minutes spent with the patient face-to-face A copy of this corresponded sent to Dr. Glori Bickers

## 2014-05-19 NOTE — Patient Instructions (Signed)
Please follow up with Dr. Perry as needed 

## 2014-05-22 DIAGNOSIS — Z853 Personal history of malignant neoplasm of breast: Secondary | ICD-10-CM | POA: Diagnosis not present

## 2014-05-28 ENCOUNTER — Ambulatory Visit (HOSPITAL_BASED_OUTPATIENT_CLINIC_OR_DEPARTMENT_OTHER): Payer: Medicare Other | Admitting: Hematology and Oncology

## 2014-05-28 ENCOUNTER — Other Ambulatory Visit (HOSPITAL_BASED_OUTPATIENT_CLINIC_OR_DEPARTMENT_OTHER): Payer: Medicare Other

## 2014-05-28 ENCOUNTER — Telehealth: Payer: Self-pay | Admitting: Hematology and Oncology

## 2014-05-28 VITALS — BP 142/77 | HR 94 | Temp 98.6°F | Resp 16 | Ht 64.0 in | Wt 160.5 lb

## 2014-05-28 DIAGNOSIS — C50812 Malignant neoplasm of overlapping sites of left female breast: Secondary | ICD-10-CM

## 2014-05-28 DIAGNOSIS — D696 Thrombocytopenia, unspecified: Secondary | ICD-10-CM | POA: Diagnosis not present

## 2014-05-28 DIAGNOSIS — E1140 Type 2 diabetes mellitus with diabetic neuropathy, unspecified: Secondary | ICD-10-CM | POA: Diagnosis not present

## 2014-05-28 DIAGNOSIS — Z170 Estrogen receptor positive status [ER+]: Secondary | ICD-10-CM | POA: Diagnosis not present

## 2014-05-28 DIAGNOSIS — C50911 Malignant neoplasm of unspecified site of right female breast: Secondary | ICD-10-CM

## 2014-05-28 DIAGNOSIS — C50919 Malignant neoplasm of unspecified site of unspecified female breast: Secondary | ICD-10-CM

## 2014-05-28 LAB — CBC WITH DIFFERENTIAL/PLATELET
BASO%: 0.5 % (ref 0.0–2.0)
Basophils Absolute: 0 10*3/uL (ref 0.0–0.1)
EOS%: 1.1 % (ref 0.0–7.0)
Eosinophils Absolute: 0.1 10*3/uL (ref 0.0–0.5)
HCT: 38.4 % (ref 34.8–46.6)
HGB: 12.1 g/dL (ref 11.6–15.9)
LYMPH%: 26.9 % (ref 14.0–49.7)
MCH: 27.1 pg (ref 25.1–34.0)
MCHC: 31.5 g/dL (ref 31.5–36.0)
MCV: 86 fL (ref 79.5–101.0)
MONO#: 0.4 10*3/uL (ref 0.1–0.9)
MONO%: 6.8 % (ref 0.0–14.0)
NEUT#: 4.1 10*3/uL (ref 1.5–6.5)
NEUT%: 64.7 % (ref 38.4–76.8)
Platelets: 111 10*3/uL — ABNORMAL LOW (ref 145–400)
RBC: 4.47 10*6/uL (ref 3.70–5.45)
RDW: 15.3 % — ABNORMAL HIGH (ref 11.2–14.5)
WBC: 6.3 10*3/uL (ref 3.9–10.3)
lymph#: 1.7 10*3/uL (ref 0.9–3.3)

## 2014-05-28 LAB — COMPREHENSIVE METABOLIC PANEL (CC13)
ALK PHOS: 74 U/L (ref 40–150)
ALT: 35 U/L (ref 0–55)
AST: 50 U/L — ABNORMAL HIGH (ref 5–34)
Albumin: 3.7 g/dL (ref 3.5–5.0)
Anion Gap: 14 mEq/L — ABNORMAL HIGH (ref 3–11)
BILIRUBIN TOTAL: 0.51 mg/dL (ref 0.20–1.20)
BUN: 19.6 mg/dL (ref 7.0–26.0)
CO2: 22 meq/L (ref 22–29)
Calcium: 9 mg/dL (ref 8.4–10.4)
Chloride: 106 mEq/L (ref 98–109)
Creatinine: 1 mg/dL (ref 0.6–1.1)
EGFR: 61 mL/min/{1.73_m2} — ABNORMAL LOW (ref >90)
Glucose: 228 mg/dl — ABNORMAL HIGH (ref 70–140)
Potassium: 4.1 mEq/L (ref 3.5–5.1)
SODIUM: 143 meq/L (ref 136–145)
Total Protein: 7.3 g/dL (ref 6.4–8.3)

## 2014-05-28 NOTE — Progress Notes (Signed)
Patient Care Team: Abner Greenspan, MD as PCP - General  DIAGNOSIS: Breast cancer, right breast   Staging form: Breast, AJCC 7th Edition     Clinical: No stage assigned - Unsigned     Pathologic: Stage IA (T1c, N0, cM0) - Signed by Rulon Eisenmenger, MD on 11/25/2013   SUMMARY OF ONCOLOGIC HISTORY:   Breast cancer, right breast   02/17/2009 Initial Diagnosis Left breast invasive ductal carcinoma 9:00 position   05/11/2009 Surgery Right breast mastectomy fibrocystic changes: Left breast mastectomy invasive and DCIS 2 foci 1.2 and 0.7 cm 5 left axillary sentinel lymph nodes negative: Grade 1 ER 100% PR 100% Ki-67 36% and 19%: HER-2 negative ratio 1 and 1.13   06/25/2009 -  Anti-estrogen oral therapy Arimidex 1 mg daily discontinued due to aches and pains changed to tamoxifen 20 mg daily: Planned treatment duration 5 years    CHIEF COMPLIANT: Follow-up of breast cancer on tamoxifen  INTERVAL HISTORY: Valerie Ball is a 69 year old lady with above-mentioned history left breast cancer treated with bilateral mastectomies and has been on adjuvant antiestrogen therapy in the past 5 years. She's been on tamoxifen because she could not tolerate Arimidex. She is here for routine follow-up to discuss stopping antiestrogen therapy with tamoxifen. She is been found to be diabetic and is not under excellent control. She is also started on thyroid medication. She continues to have neuropathy related to diabetes. She denies any new lumps or nodules the breasts.  REVIEW OF SYSTEMS:   Constitutional: Denies fevers, chills or abnormal weight loss Eyes: Denies blurriness of vision Ears, nose, mouth, throat, and face: Denies mucositis or sore throat Respiratory: Denies cough, dyspnea or wheezes Cardiovascular: Denies palpitation, chest discomfort or lower extremity swelling Gastrointestinal:  Denies nausea, heartburn or change in bowel habits Skin: Denies abnormal skin rashes Lymphatics: Denies new lymphadenopathy or  easy bruising Neurological:Denies numbness, tingling or new weaknesses Behavioral/Psych: Mood is stable, no new changes  Breast:  denies any pain or lumps or nodules in either breasts All other systems were reviewed with the patient and are negative.  I have reviewed the past medical history, past surgical history, social history and family history with the patient and they are unchanged from previous note.  ALLERGIES:  is allergic to arimidex; metformin and related; and penicillins.  MEDICATIONS:  Current Outpatient Prescriptions  Medication Sig Dispense Refill  . ALPRAZolam (XANAX) 0.25 MG tablet Take tablet 20 minutes before MRCP 1 tablet 0  . Cholecalciferol (VITAMIN D-3 PO) Take 1,000 mg by mouth daily.     . enalapril (VASOTEC) 20 MG tablet TAKE 1 TABLET (20 MG TOTAL) BY MOUTH AT BEDTIME. 90 tablet 2  . fluticasone (FLONASE) 50 MCG/ACT nasal spray Place 2 sprays into both nostrils daily as needed. Allergies 16 g 11  . glipiZIDE (GLUCOTROL XL) 5 MG 24 hr tablet Take 1 tablet (5 mg total) by mouth daily with breakfast. 30 tablet 5  . Levothyroxine Sodium 25 MCG CAPS Take 1 capsule (25 mcg total) by mouth daily before breakfast. Take in the morning 1/2 hour before other medicines 30 capsule 5  . Multiple Vitamin (MULTIVITAMIN WITH MINERALS) TABS tablet Take 1 tablet by mouth daily.    Marland Kitchen omeprazole (PRILOSEC) 20 MG capsule TAKE 1 CAPSULE (20 MG TOTAL) BY MOUTH AT BEDTIME. 90 capsule 2  . ondansetron (ZOFRAN ODT) 8 MG disintegrating tablet Take 1 tablet (8 mg total) by mouth every 8 (eight) hours as needed for nausea or vomiting. 20 tablet  0  . rOPINIRole (REQUIP) 1 MG tablet TAKE 1 TABLET (1 MG TOTAL) BY MOUTH AT BEDTIME. 90 tablet 2  . sertraline (ZOLOFT) 100 MG tablet TAKE 1 TABLET (100 MG TOTAL) BY MOUTH AT BEDTIME. 90 tablet 2  . sucralfate (CARAFATE) 1 G tablet Take 1 tablet (1 g total) by mouth 4 (four) times daily -  before meals and at bedtime. 120 tablet 0  . tamoxifen (NOLVADEX)  20 MG tablet TAKE 1 TABLET (20 MG TOTAL) BY MOUTH DAILY. 30 tablet 6  . ZETIA 10 MG tablet TAKE 1 TABLET (10 MG TOTAL) BY MOUTH DAILY. 90 tablet 2  . [DISCONTINUED] cetirizine (ZYRTEC) 10 MG tablet Take 10 mg by mouth daily as needed. For allergy symptoms    . [DISCONTINUED] hydrochlorothiazide 25 MG tablet Take 25 mg by mouth daily.       No current facility-administered medications for this visit.    PHYSICAL EXAMINATION: ECOG PERFORMANCE STATUS: 1 - Symptomatic but completely ambulatory  Filed Vitals:   05/28/14 1155  BP: 142/77  Pulse: 94  Temp: 98.6 F (37 C)  Resp: 16   Filed Weights   05/28/14 1155  Weight: 160 lb 8 oz (72.802 kg)    GENERAL:alert, no distress and comfortable SKIN: skin color, texture, turgor are normal, no rashes or significant lesions EYES: normal, Conjunctiva are pink and non-injected, sclera clear OROPHARYNX:no exudate, no erythema and lips, buccal mucosa, and tongue normal  NECK: supple, thyroid normal size, non-tender, without nodularity LYMPH:  no palpable lymphadenopathy in the cervical, axillary or inguinal LUNGS: clear to auscultation and percussion with normal breathing effort HEART: regular rate & rhythm and no murmurs and no lower extremity edema ABDOMEN:abdomen soft, non-tender and normal bowel sounds Musculoskeletal:no cyanosis of digits and no clubbing  NEURO: alert & oriented x 3 with fluent speech, no focal motor/sensory deficits BREAST: No palpable lumps or nodules in both chest wall. No palpable axillary supraclavicular or infraclavicular adenopathy. (exam performed in the presence of a chaperone)  LABORATORY DATA:  I have reviewed the data as listed   Chemistry      Component Value Date/Time   NA 143 05/28/2014 1114   NA 142 04/16/2014 0931   NA 135 10/14/2009 1409   K 4.1 05/28/2014 1114   K 4.1 04/16/2014 0931   K 3.7 10/14/2009 1409   CL 106 04/16/2014 0931   CL 103 08/21/2012 1144   CL 96* 10/14/2009 1409   CO2 22  05/28/2014 1114   CO2 26 04/16/2014 0931   CO2 28 10/14/2009 1409   BUN 19.6 05/28/2014 1114   BUN 17 04/16/2014 0931   BUN 16 10/14/2009 1409   CREATININE 1.0 05/28/2014 1114   CREATININE 0.70 04/16/2014 1116   CREATININE 0.8 10/14/2009 1409      Component Value Date/Time   CALCIUM 9.0 05/28/2014 1114   CALCIUM 8.9 04/16/2014 0931   CALCIUM 9.2 10/14/2009 1409   ALKPHOS 74 05/28/2014 1114   ALKPHOS 86 04/16/2014 0931   ALKPHOS 75 10/14/2009 1409   AST 50* 05/28/2014 1114   AST 51* 04/16/2014 0931   AST 98* 10/14/2009 1409   ALT 35 05/28/2014 1114   ALT 27 04/16/2014 0931   ALT 67* 10/14/2009 1409   BILITOT 0.51 05/28/2014 1114   BILITOT 0.4 04/16/2014 0931   BILITOT 0.50 10/14/2009 1409       Lab Results  Component Value Date   WBC 6.3 05/28/2014   HGB 12.1 05/28/2014   HCT 38.4 05/28/2014  MCV 86.0 05/28/2014   PLT 111* 05/28/2014   NEUTROABS 4.1 05/28/2014    ASSESSMENT & PLAN:  Breast cancer, right breast Right breast IDC T1 C. N0 M0 stage IA status post bilateral mastectomies with immediate reconstruction in 2011 followed by antiestrogen therapy initially with aromatase inhibitors but because of intolerance she was switched to tamoxifen. She iss tolerating tamoxifen much better and plans to complete 5 years of therapy by April 2016.  Thrombocytopenia: Platelets are fluctuating between 100 to 160. She does not have any bruising or bleeding symptoms. They did not need to be worked up at this point. If it drops below 50, we will plan on doing a bone marrow biopsy.  Diabetic neuropathy: Her sugars are also much elevated today. She says she is working on this.  Breast cancer surveillance: 1. Chest wall examination 05/28/2014 is normal 2. Since she had bilateral mastectomies and is notable of imaging studies   Tamoxifen toxicities: No major toxicities but because she takes so many pills she is be happy to discontinue tamoxifen therapy at the end of this  month.  Survivorship:Discussed the importance of physical exercise in decreasing the likelihood of breast cancer recurrence. Recommended 30 mins daily 6 days a week of either brisk walking or cycling or swimming. Encouraged patient to eat more fruits and vegetables and decrease red meat.   No orders of the defined types were placed in this encounter.   The patient has a good understanding of the overall plan. she agrees with it. She will call with any problems that may develop before her next visit here.   Rulon Eisenmenger, MD

## 2014-05-28 NOTE — Telephone Encounter (Signed)
avs printed for pt and inbasket/staff to [facility] for one year appt with her  anne

## 2014-05-28 NOTE — Assessment & Plan Note (Signed)
Right breast IDC T1 C. N0 M0 stage IA status post bilateral mastectomies with immediate reconstruction in 2011 followed by antiestrogen therapy initially with aromatase inhibitors but because of intolerance she was switched to tamoxifen. She iss tolerating tamoxifen much better and plans to complete 5 years of therapy by April 2016.  Thrombocytopenia:  Diabetic neuropathy:  Breast cancer surveillance: 1. Chest wall examination 05/28/2014 is normal 2. Since she had bilateral mastectomies and is notable of imaging studies   Tamoxifen toxicities:  Survivorship:Discussed the importance of physical exercise in decreasing the likelihood of breast cancer recurrence. Recommended 30 mins daily 6 days a week of either brisk walking or cycling or swimming. Encouraged patient to eat more fruits and vegetables and decrease red meat.

## 2014-05-31 ENCOUNTER — Encounter: Payer: Self-pay | Admitting: Family Medicine

## 2014-06-01 ENCOUNTER — Other Ambulatory Visit: Payer: Self-pay | Admitting: Family Medicine

## 2014-06-09 ENCOUNTER — Other Ambulatory Visit: Payer: Self-pay | Admitting: Oncology

## 2014-07-19 ENCOUNTER — Telehealth: Payer: Self-pay | Admitting: Family Medicine

## 2014-07-19 DIAGNOSIS — E785 Hyperlipidemia, unspecified: Secondary | ICD-10-CM

## 2014-07-19 DIAGNOSIS — I10 Essential (primary) hypertension: Secondary | ICD-10-CM

## 2014-07-19 DIAGNOSIS — E119 Type 2 diabetes mellitus without complications: Secondary | ICD-10-CM

## 2014-07-19 NOTE — Telephone Encounter (Signed)
-----   Message from Ellamae Sia sent at 07/17/2014  9:01 AM EDT ----- Regarding: Lab orders for Monday, 5.2.16 Lab orders for 3 month labs

## 2014-07-20 ENCOUNTER — Other Ambulatory Visit (INDEPENDENT_AMBULATORY_CARE_PROVIDER_SITE_OTHER): Payer: Medicare Other

## 2014-07-20 DIAGNOSIS — E119 Type 2 diabetes mellitus without complications: Secondary | ICD-10-CM | POA: Diagnosis not present

## 2014-07-20 DIAGNOSIS — E785 Hyperlipidemia, unspecified: Secondary | ICD-10-CM

## 2014-07-20 LAB — LIPID PANEL
Cholesterol: 147 mg/dL (ref 0–200)
HDL: 26.8 mg/dL — ABNORMAL LOW (ref >39.00)
NonHDL: 120.2
Total CHOL/HDL Ratio: 5
Triglycerides: 247 mg/dL — ABNORMAL HIGH (ref 0.0–149.0)
VLDL: 49.4 mg/dL — ABNORMAL HIGH (ref 0.0–40.0)

## 2014-07-20 LAB — HEMOGLOBIN A1C: HEMOGLOBIN A1C: 6.9 % — AB (ref 4.6–6.5)

## 2014-07-20 LAB — LDL CHOLESTEROL, DIRECT: Direct LDL: 86 mg/dL

## 2014-07-22 ENCOUNTER — Ambulatory Visit (INDEPENDENT_AMBULATORY_CARE_PROVIDER_SITE_OTHER): Payer: Medicare Other | Admitting: Family Medicine

## 2014-07-22 ENCOUNTER — Encounter: Payer: Self-pay | Admitting: Family Medicine

## 2014-07-22 VITALS — BP 144/78 | HR 90 | Temp 98.3°F | Ht 64.0 in | Wt 158.2 lb

## 2014-07-22 DIAGNOSIS — K760 Fatty (change of) liver, not elsewhere classified: Secondary | ICD-10-CM

## 2014-07-22 DIAGNOSIS — M1A0790 Idiopathic chronic gout, unspecified ankle and foot, without tophus (tophi): Secondary | ICD-10-CM

## 2014-07-22 DIAGNOSIS — I10 Essential (primary) hypertension: Secondary | ICD-10-CM | POA: Diagnosis not present

## 2014-07-22 DIAGNOSIS — E039 Hypothyroidism, unspecified: Secondary | ICD-10-CM | POA: Diagnosis not present

## 2014-07-22 DIAGNOSIS — E119 Type 2 diabetes mellitus without complications: Secondary | ICD-10-CM

## 2014-07-22 DIAGNOSIS — M109 Gout, unspecified: Secondary | ICD-10-CM | POA: Insufficient documentation

## 2014-07-22 LAB — HEPATIC FUNCTION PANEL
ALK PHOS: 71 U/L (ref 39–117)
ALT: 41 U/L — AB (ref 0–35)
AST: 73 U/L — AB (ref 0–37)
Albumin: 4.4 g/dL (ref 3.5–5.2)
BILIRUBIN DIRECT: 0.1 mg/dL (ref 0.0–0.3)
TOTAL PROTEIN: 8 g/dL (ref 6.0–8.3)
Total Bilirubin: 0.4 mg/dL (ref 0.2–1.2)

## 2014-07-22 LAB — TSH: TSH: 2.66 u[IU]/mL (ref 0.35–4.50)

## 2014-07-22 LAB — URIC ACID: Uric Acid, Serum: 8 mg/dL — ABNORMAL HIGH (ref 2.4–7.0)

## 2014-07-22 MED ORDER — ENALAPRIL MALEATE 20 MG PO TABS: ORAL_TABLET | ORAL | Status: DC

## 2014-07-22 NOTE — Progress Notes (Signed)
Subjective:    Patient ID: Valerie Ball, female    DOB: Feb 20, 1946, 69 y.o.   MRN: [id number]  HPI Here for f/u of chronic medical problems  bp is stable today -still not at goal on first check  No cp or palpitations or headaches or edema  No side effects to medicines  BP Readings from Last 3 Encounters:  07/22/14 144/78  05/28/14 142/77  05/19/14 130/70        Chemistry      Component Value Date/Time   NA 143 05/28/2014 1114   NA 142 04/16/2014 0931   NA 135 10/14/2009 1409   K 4.1 05/28/2014 1114   K 4.1 04/16/2014 0931   K 3.7 10/14/2009 1409   CL 106 04/16/2014 0931   CL 103 08/21/2012 1144   CL 96* 10/14/2009 1409   CO2 22 05/28/2014 1114   CO2 26 04/16/2014 0931   CO2 28 10/14/2009 1409   BUN 19.6 05/28/2014 1114   BUN 17 04/16/2014 0931   BUN 16 10/14/2009 1409   CREATININE 1.0 05/28/2014 1114   CREATININE 0.70 04/16/2014 1116   CREATININE 0.8 10/14/2009 1409      Component Value Date/Time   CALCIUM 9.0 05/28/2014 1114   CALCIUM 8.9 04/16/2014 0931   CALCIUM 9.2 10/14/2009 1409   ALKPHOS 74 05/28/2014 1114   ALKPHOS 86 04/16/2014 0931   ALKPHOS 75 10/14/2009 1409   AST 50* 05/28/2014 1114   AST 51* 04/16/2014 0931   AST 98* 10/14/2009 1409   ALT 35 05/28/2014 1114   ALT 27 04/16/2014 0931   ALT 67* 10/14/2009 1409   BILITOT 0.51 05/28/2014 1114   BILITOT 0.4 04/16/2014 0931   BILITOT 0.50 10/14/2009 1409       DM Improved Lab Results  Component Value Date   HGBA1C 6.9* 07/20/2014   Intol of glipizide-hypoglycemia Cannot take metformin  No meds for blood glucose currently  Avoids sweets  No sodas  Only diet drinks and a lot more water  Exercises when she can - but has had problems with gout in big toes twice now (painful red swollen toe joints)- takes otc naproxen      Wt is down 2 lb with bmi of 27 (even with prosthesis which weighs 3 lb)   Lab Results  Component Value Date   CHOL 147 07/20/2014   HDL 26.80* 07/20/2014   LDLDIRECT 86.0 07/20/2014   TRIG 247.0* 07/20/2014   CHOLHDL 5 07/20/2014  [facility]  Will [facility] season also    Patient Active Problem List   Diagnosis Date Noted  . Gout 07/22/2014  . Hypothyroidism 04/23/2014  . Heme + stool 02/18/2014  . Nausea without vomiting 02/18/2014  . Elevated LFTs 02/18/2014  . Occult blood positive stool 02/03/2014  . Encounter for Medicare annual wellness exam 01/23/2014  . Estrogen deficiency 01/23/2014  . Colon cancer screening 01/23/2014  . Abdominal pain, epigastric 01/20/2014  . Tremor 01/20/2014  . RUQ abdominal pain 01/20/2014  . Dizziness 01/07/2014  . Allergic rhinitis 01/07/2014  . Headache above the eye region 01/07/2014  . Thrombocytopenia, unspecified 11/25/2013  . Diabetic neuropathy 11/25/2013  . History of breast cancer 06/07/2012  . Lap Nissen October 2013 01/19/2012  . Large type III mixed hiatus hernia  12/06/2011  . Stress reaction 10/25/2011  . Routine general medical examination at a health care facility 07/11/2011  . ADVERSE DRUG REACTION 05/05/2010  . TRANSAMINASES, SERUM, ELEVATED 11/11/2009  .  Breast cancer, right breast 08/02/2009  . DIVERTICULOSIS-COLON 03/03/2008  . ESOPHAGEAL STRICTURE 03/02/2008  . HIATAL HERNIA 03/02/2008  . PERITONEAL ADHESIONS 03/02/2008  . Anemia 02/07/2008  . Diabetes type 2, controlled 08/19/2007  . ANXIETY 04/15/2007  . DISORDERS, ORGANIC SLEEP NEC 08/23/2006  . SYNDROME, RESTLESS LEGS 08/23/2006  . SYNDROME, CARPAL TUNNEL 08/23/2006  . Hyperlipidemia LDL goal <100 08/22/2006  . Essential hypertension 08/22/2006  . ALLERGIC RHINITIS 08/22/2006  . GERD 08/22/2006  . Fatty liver 08/22/2006  . SEIZURE DISORDER 08/22/2006   Past Medical History  Diagnosis Date  . Allergy     allegic rhinitis  . Anxiety   . GERD (gastroesophageal reflux disease)   . Hypertension   . Hyperlipidemia   . Seizure disorder   . RLS (restless legs syndrome)   .  Arthritis   . Diverticulosis   . GI bleed   . Cataract   . Salmonella 05/2000    renal effects secondary to dehydration  . Hiatal hernia   . Melena 01/2008    anemia transfusion  . Esophageal stricture 02/04/2008  . Borderline diabetic     per patient medical history form  . Menopause     per patient medical history form  . Postmenopausal hormone therapy     per patient medical history form.  . Blood transfusion   . Osteoporosis   . Colon stricture 01/14/2003  . Diabetes mellitus     pt's medical doctor took her off diabetic meds-blood sugars have not been a problem--and medications were causing pt's sugars to be too low  . Seizures     one seizure several yrs ago --etiology unknown-no problem since  . Anemia   . Breast cancer 02/2009    breast CA L invasive ductal CA(hormone receptor positive.)  . Colon polyp     hyperplastic  . Hepatic steatosis    Past Surgical History  Procedure Laterality Date  . Abdominal hysterectomy  1983  . Ovary surgery  1986    ovarian tumor removed  . Mastectomy  04/2009    Bilateral for ductal carcinoma on L  . Breast surgery  02/2009    breast biopsy invasive ductal CA-bilateral mastectomies  . Epigastric hernia repair  01/02/2012    Procedure: HERNIA REPAIR EPIGASTRIC ADULT;  Surgeon: Pedro Earls, MD;  Location: [facility];  Service: General;  Laterality: N/A;  Laparoscopic Repair Paraesophageal Hernia, Nissen  . Laparoscopic nissen fundoplication  [id number]    Procedure: LAPAROSCOPIC NISSEN FUNDOPLICATION;  Surgeon: Pedro Earls, MD;  Location: [facility];  Service: General;  Laterality: N/A;   History  Substance Use Topics  . Smoking status: Never Smoker   . Smokeless tobacco: Never Used  . Alcohol Use: No   Family History  Problem Relation Age of Onset  . Heart attack Father   . Esophageal cancer Father   . Diabetes Father   . Cancer Father     esophageal  . Breast cancer Sister   . Diabetes Sister   . Cancer Sister      breast  . Hypertension Brother   . Diabetes Brother   . Breast cancer Sister   . Cancer Sister     breast  . Hypertension Mother   . Stroke Mother   . Breast cancer Mother   . Cancer Mother     breast  . Hypertension Sister    Allergies  Allergen Reactions  . Arimidex [Anastrozole]     Joint pain  . Glipizide Other (  See Comments)    Hypoglycemia   . Metformin And Related     diarrhea  . Penicillins Rash   Current Outpatient Prescriptions on File Prior to Visit  Medication Sig Dispense Refill  . Cholecalciferol (VITAMIN D-3 PO) Take 1,000 mg by mouth daily.     . fluticasone (FLONASE) 50 MCG/ACT nasal spray Place 2 sprays into both nostrils daily as needed. Allergies 16 g 11  . Levothyroxine Sodium 25 MCG CAPS Take 1 capsule (25 mcg total) by mouth daily before breakfast. Take in the morning 1/2 hour before other medicines 30 capsule 5  . Multiple Vitamin (MULTIVITAMIN WITH MINERALS) TABS tablet Take 1 tablet by mouth daily.    Marland Kitchen omeprazole (PRILOSEC) 20 MG capsule TAKE 1 CAPSULE (20 MG TOTAL) BY MOUTH AT BEDTIME. 90 capsule 2  . rOPINIRole (REQUIP) 1 MG tablet TAKE 1 TABLET (1 MG TOTAL) BY MOUTH AT BEDTIME. 90 tablet 2  . sertraline (ZOLOFT) 100 MG tablet TAKE 1 TABLET (100 MG TOTAL) BY MOUTH AT BEDTIME. 90 tablet 2  . ZETIA 10 MG tablet TAKE 1 TABLET (10 MG TOTAL) BY MOUTH DAILY. 90 tablet 2  . [DISCONTINUED] cetirizine (ZYRTEC) 10 MG tablet Take 10 mg by mouth daily as needed. For allergy symptoms    . [DISCONTINUED] hydrochlorothiazide 25 MG tablet Take 25 mg by mouth daily.       No current facility-administered medications on file prior to visit.    Review of Systems Review of Systems  Constitutional: Negative for fever, appetite change, fatigue and unexpected weight change.  Eyes: Negative for pain and visual disturbance.  Respiratory: Negative for cough and shortness of breath.   Cardiovascular: Negative for cp or palpitations    Gastrointestinal: Negative for  nausea, diarrhea and constipation.  Genitourinary: Negative for urgency and frequency.  Skin: Negative for pallor or rash   MSK pos for pain in both great toes/joints - now resolved (with swelling and redness) Neurological: Negative for weakness, light-headedness, numbness and headaches.  Hematological: Negative for adenopathy. Does not bruise/bleed easily.  Psychiatric/Behavioral: Negative for dysphoric mood. The patient is not nervous/anxious.         Objective:   Physical Exam  Constitutional: She appears well-developed and well-nourished. No distress.  overwt and well app  HENT:  Head: Normocephalic and atraumatic.  Mouth/Throat: Oropharynx is clear and moist.  Eyes: Conjunctivae and EOM are normal. Pupils are equal, round, and reactive to light.  Neck: Normal range of motion. Neck supple. No JVD present. Carotid bruit is not present. No thyromegaly present.  Cardiovascular: Normal rate, regular rhythm, normal heart sounds and intact distal pulses.  Exam reveals no gallop.   Pulmonary/Chest: Effort normal and breath sounds normal. No respiratory distress. She has no wheezes. She has no rales.  No crackles  Abdominal: Soft. Bowel sounds are normal. She exhibits no distension, no abdominal bruit and no mass. There is no tenderness.  Musculoskeletal: She exhibits tenderness. She exhibits no edema.  Both great toe joints are still mildly tender (erythema and swelling resolved)  Lymphadenopathy:    She has no cervical adenopathy.  Neurological: She is alert. She has normal reflexes.  Skin: Skin is warm and dry. No rash noted.  Psychiatric: She has a normal mood and affect.          Assessment & Plan:   Problem List Items Addressed This Visit      Cardiovascular and Mediastinum   Essential hypertension    bp is not at goal BP: Marland Kitchen)  144/78 mmHg    Inc enalapril to bid -update if any side eff or problems Rev lifestyle changes and DASH diet  F/u planned       Relevant  Medications   enalapril (VASOTEC) 20 MG tablet     Digestive   Fatty liver    LFT tests today Disc imp of low fat diet/ enc wt loss       Relevant Orders   Hepatic function panel (Completed)     Endocrine   Diabetes type 2, controlled - Primary    Lab Results  Component Value Date   HGBA1C 6.9* 07/20/2014   Improved with lifestyle change alone  Intol of metformin and glipizide Rev low glycemic diet  Enc wt loss and exercise       Relevant Medications   enalapril (VASOTEC) 20 MG tablet   Hypothyroidism    Re check TSH today Asymptomatic currently      Relevant Orders   TSH (Completed)     Other   Gout    Several episodes in feet Check uric acid today Disc avoidance of dehydration and also low purine diet-handouts given  May consider prophylaxis - also r echeck LFT today      Relevant Orders   Uric acid (Completed)

## 2014-07-22 NOTE — Progress Notes (Signed)
Pre visit review using our clinic review tool, if applicable. No additional management support is needed unless otherwise documented below in the visit note. 

## 2014-07-22 NOTE — Patient Instructions (Signed)
Increase your enalapril to 20 mg twice daily - if any problems please let me know Labs today  Follow up in about 2-3 weeks  Drink lots of water to prevent gout  Here is a handout on low purine diet   Diabetes looks better- keep up the good work!

## 2014-07-23 NOTE — Assessment & Plan Note (Signed)
Re check TSH today Asymptomatic currently

## 2014-07-23 NOTE — Assessment & Plan Note (Signed)
Several episodes in feet Check uric acid today Disc avoidance of dehydration and also low purine diet-handouts given  May consider prophylaxis - also r echeck LFT today

## 2014-07-23 NOTE — Assessment & Plan Note (Signed)
LFT tests today Disc imp of low fat diet/ enc wt loss

## 2014-07-23 NOTE — Assessment & Plan Note (Signed)
Lab Results  Component Value Date   HGBA1C 6.9* 07/20/2014   Improved with lifestyle change alone  Intol of metformin and glipizide Rev low glycemic diet  Enc wt loss and exercise

## 2014-07-23 NOTE — Assessment & Plan Note (Signed)
bp is not at goal BP: (!) 144/78 mmHg    Inc enalapril to bid -update if any side eff or problems Rev lifestyle changes and DASH diet  F/u planned

## 2014-08-11 ENCOUNTER — Telehealth: Payer: Self-pay | Admitting: *Deleted

## 2014-08-11 ENCOUNTER — Encounter: Payer: Self-pay | Admitting: Family Medicine

## 2014-08-11 ENCOUNTER — Ambulatory Visit (INDEPENDENT_AMBULATORY_CARE_PROVIDER_SITE_OTHER): Payer: Medicare Other | Admitting: Family Medicine

## 2014-08-11 VITALS — BP 150/96 | HR 94 | Temp 98.4°F | Ht 64.0 in | Wt 156.8 lb

## 2014-08-11 DIAGNOSIS — M1A0790 Idiopathic chronic gout, unspecified ankle and foot, without tophus (tophi): Secondary | ICD-10-CM | POA: Diagnosis not present

## 2014-08-11 DIAGNOSIS — E039 Hypothyroidism, unspecified: Secondary | ICD-10-CM | POA: Diagnosis not present

## 2014-08-11 DIAGNOSIS — I10 Essential (primary) hypertension: Secondary | ICD-10-CM

## 2014-08-11 DIAGNOSIS — R945 Abnormal results of liver function studies: Secondary | ICD-10-CM

## 2014-08-11 DIAGNOSIS — R7989 Other specified abnormal findings of blood chemistry: Secondary | ICD-10-CM | POA: Diagnosis not present

## 2014-08-11 MED ORDER — FEBUXOSTAT 40 MG PO TABS
40.0000 mg | ORAL_TABLET | Freq: Every day | ORAL | Status: DC
Start: 2014-08-11 — End: 2014-10-14

## 2014-08-11 NOTE — Assessment & Plan Note (Signed)
bp not at goal due to forgetting medication today  Reminded impt of compliance  F/u planned -will return after taking her medications  BP Readings from Last 1 Encounters:  08/11/14 150/96   No changes needed Disc lifstyle change with low sodium diet and exercise

## 2014-08-11 NOTE — Assessment & Plan Note (Signed)
Rev labs Hx of fatty liver Disc imp of wt loss/ low fat diet and avoidance of acetaminophen and etoh   Continue to follow  No symptoms

## 2014-08-11 NOTE — Progress Notes (Signed)
Pre visit review using our clinic review tool, if applicable. No additional management support is needed unless otherwise documented below in the visit note. 

## 2014-08-11 NOTE — Assessment & Plan Note (Signed)
Hypothyroidism  Pt has no clinical changes No change in energy level/ hair or skin/ edema and no tremor Lab Results  Component Value Date   TSH 2.66 07/22/2014

## 2014-08-11 NOTE — Assessment & Plan Note (Signed)
No flare currently Uric acid was elevated at 8.0  Will begin uloric 40 mg daily (not allopurinol due to elevated LFT from fatty liver) F/u planned -will check uric acid then  Will update if any flares  Rev low purine diet

## 2014-08-11 NOTE — Telephone Encounter (Signed)
PA approved for pt's Uloric, Pharmacy notified approval # [id number]

## 2014-08-11 NOTE — Patient Instructions (Signed)
Do not miss doses of blood pressure medicine  Start uloric for gout prevention  Keep working on weight loss If any problems please let me know  Follow up in 4-6 weeks for blood pressure check and we will do labs for your uric acid that day also

## 2014-08-11 NOTE — Progress Notes (Signed)
Subjective:    Patient ID: Valerie Ball, female    DOB: 30-May-1945, 69 y.o.   MRN: [id number]  HPI Here for f/u of chronic medial problems   Wt is down 2 more lb  Really working on it  Plans to exercise more also  Pool is opening soon   Last visit we inc enalapril to bid 20 mg - but she missed her dose today in the am  BP Readings from Last 3 Encounters:  08/11/14 150/96  07/22/14 144/78  05/28/14 142/77    Also checked uric acid for gout  It was 8.0 No more gout flares  Is interested in trying uloric if she can afford this  She also did cut back on some high purine foods like shellfish and aged foods   Liver tests did elevate slightly Lab Results  Component Value Date   ALT 41* 07/22/2014   AST 73* 07/22/2014   ALKPHOS 71 07/22/2014   BILITOT 0.4 07/22/2014     Hypothyroidism  Pt has no clinical changes No change in energy level/ hair or skin/ edema and no tremor Lab Results  Component Value Date   TSH 2.66 07/22/2014     Patient Active Problem List   Diagnosis Date Noted  . Gout 07/22/2014  . Hypothyroidism 04/23/2014  . Heme + stool 02/18/2014  . Nausea without vomiting 02/18/2014  . Elevated LFTs 02/18/2014  . Occult blood positive stool 02/03/2014  . Encounter for Medicare annual wellness exam 01/23/2014  . Estrogen deficiency 01/23/2014  . Colon cancer screening 01/23/2014  . Abdominal pain, epigastric 01/20/2014  . Tremor 01/20/2014  . RUQ abdominal pain 01/20/2014  . Dizziness 01/07/2014  . Allergic rhinitis 01/07/2014  . Headache above the eye region 01/07/2014  . Thrombocytopenia, unspecified 11/25/2013  . Diabetic neuropathy 11/25/2013  . History of breast cancer 06/07/2012  . Lap Nissen October 2013 01/19/2012  . Large type III mixed hiatus hernia  12/06/2011  . Stress reaction 10/25/2011  . Routine general medical examination at a health care facility 07/11/2011  . ADVERSE DRUG REACTION 05/05/2010  . TRANSAMINASES, SERUM, ELEVATED  11/11/2009  . Breast cancer, right breast 08/02/2009  . DIVERTICULOSIS-COLON 03/03/2008  . ESOPHAGEAL STRICTURE 03/02/2008  . HIATAL HERNIA 03/02/2008  . PERITONEAL ADHESIONS 03/02/2008  . Anemia 02/07/2008  . Diabetes type 2, controlled 08/19/2007  . ANXIETY 04/15/2007  . DISORDERS, ORGANIC SLEEP NEC 08/23/2006  . SYNDROME, RESTLESS LEGS 08/23/2006  . SYNDROME, CARPAL TUNNEL 08/23/2006  . Hyperlipidemia LDL goal <100 08/22/2006  . Essential hypertension 08/22/2006  . ALLERGIC RHINITIS 08/22/2006  . GERD 08/22/2006  . Fatty liver 08/22/2006  . SEIZURE DISORDER 08/22/2006   Past Medical History  Diagnosis Date  . Allergy     allegic rhinitis  . Anxiety   . GERD (gastroesophageal reflux disease)   . Hypertension   . Hyperlipidemia   . Seizure disorder   . RLS (restless legs syndrome)   . Arthritis   . Diverticulosis   . GI bleed   . Cataract   . Salmonella 05/2000    renal effects secondary to dehydration  . Hiatal hernia   . Melena 01/2008    anemia transfusion  . Esophageal stricture 02/04/2008  . Borderline diabetic     per patient medical history form  . Menopause     per patient medical history form  . Postmenopausal hormone therapy     per patient medical history form.  . Blood transfusion   . Osteoporosis   .  Colon stricture 01/14/2003  . Diabetes mellitus     pt's medical doctor took her off diabetic meds-blood sugars have not been a problem--and medications were causing pt's sugars to be too low  . Seizures     one seizure several yrs ago --etiology unknown-no problem since  . Anemia   . Breast cancer 02/2009    breast CA L invasive ductal CA(hormone receptor positive.)  . Colon polyp     hyperplastic  . Hepatic steatosis    Past Surgical History  Procedure Laterality Date  . Abdominal hysterectomy  1983  . Ovary surgery  1986    ovarian tumor removed  . Mastectomy  04/2009    Bilateral for ductal carcinoma on L  . Breast surgery  02/2009     breast biopsy invasive ductal CA-bilateral mastectomies  . Epigastric hernia repair  01/02/2012    Procedure: HERNIA REPAIR EPIGASTRIC ADULT;  Surgeon: Pedro Earls, MD;  Location: [facility];  Service: General;  Laterality: N/A;  Laparoscopic Repair Paraesophageal Hernia, Nissen  . Laparoscopic nissen fundoplication  13/24/4010    Procedure: LAPAROSCOPIC NISSEN FUNDOPLICATION;  Surgeon: Pedro Earls, MD;  Location: [facility];  Service: General;  Laterality: N/A;   History  Substance Use Topics  . Smoking status: Never Smoker   . Smokeless tobacco: Never Used  . Alcohol Use: No   Family History  Problem Relation Age of Onset  . Heart attack Father   . Esophageal cancer Father   . Diabetes Father   . Cancer Father     esophageal  . Breast cancer Sister   . Diabetes Sister   . Cancer Sister     breast  . Hypertension Brother   . Diabetes Brother   . Breast cancer Sister   . Cancer Sister     breast  . Hypertension Mother   . Stroke Mother   . Breast cancer Mother   . Cancer Mother     breast  . Hypertension Sister    Allergies  Allergen Reactions  . Arimidex [Anastrozole]     Joint pain  . Glipizide Other (See Comments)    Hypoglycemia   . Metformin And Related     diarrhea  . Penicillins Rash   Current Outpatient Prescriptions on File Prior to Visit  Medication Sig Dispense Refill  . Cholecalciferol (VITAMIN D-3 PO) Take 1,000 mg by mouth daily.     . enalapril (VASOTEC) 20 MG tablet TAKE 1 TABLET (20 MG TOTAL) BY MOUTH TWICE DAILY 180 tablet 3  . fluticasone (FLONASE) 50 MCG/ACT nasal spray Place 2 sprays into both nostrils daily as needed. Allergies 16 g 11  . Levothyroxine Sodium 25 MCG CAPS Take 1 capsule (25 mcg total) by mouth daily before breakfast. Take in the morning 1/2 hour before other medicines 30 capsule 5  . Multiple Vitamin (MULTIVITAMIN WITH MINERALS) TABS tablet Take 1 tablet by mouth daily.    Marland Kitchen omeprazole (PRILOSEC) 20 MG capsule TAKE 1 CAPSULE  (20 MG TOTAL) BY MOUTH AT BEDTIME. 90 capsule 2  . rOPINIRole (REQUIP) 1 MG tablet TAKE 1 TABLET (1 MG TOTAL) BY MOUTH AT BEDTIME. 90 tablet 2  . sertraline (ZOLOFT) 100 MG tablet TAKE 1 TABLET (100 MG TOTAL) BY MOUTH AT BEDTIME. 90 tablet 2  . ZETIA 10 MG tablet TAKE 1 TABLET (10 MG TOTAL) BY MOUTH DAILY. 90 tablet 2  . [DISCONTINUED] cetirizine (ZYRTEC) 10 MG tablet Take 10 mg by mouth daily as needed. For allergy  symptoms    . [DISCONTINUED] hydrochlorothiazide 25 MG tablet Take 25 mg by mouth daily.       No current facility-administered medications on file prior to visit.     Review of Systems Review of Systems  Constitutional: Negative for fever, appetite change, fatigue and unexpected weight change.  Eyes: Negative for pain and visual disturbance.  Respiratory: Negative for cough and shortness of breath.   Cardiovascular: Negative for cp or palpitations    Gastrointestinal: Negative for nausea, diarrhea and constipation.  Genitourinary: Negative for urgency and frequency.  Skin: Negative for pallor or rash   MSK neg for joint pain or swelling  Neurological: Negative for weakness, light-headedness, numbness and headaches.  Hematological: Negative for adenopathy. Does not bruise/bleed easily.  Psychiatric/Behavioral: Negative for dysphoric mood. The patient is not nervous/anxious.         Objective:   Physical Exam  Constitutional: She appears well-developed and well-nourished. No distress.  overwt and well appearing   HENT:  Head: Normocephalic and atraumatic.  Mouth/Throat: Oropharynx is clear and moist.  Eyes: Conjunctivae and EOM are normal. Pupils are equal, round, and reactive to light.  Neck: Normal range of motion. Neck supple. No JVD present. Carotid bruit is not present. No thyromegaly present.  Cardiovascular: Normal rate, regular rhythm, normal heart sounds and intact distal pulses.  Exam reveals no gallop.   Pulmonary/Chest: Effort normal and breath sounds  normal. No respiratory distress. She has no wheezes. She has no rales.  No crackles  Abdominal: Soft. Bowel sounds are normal. She exhibits no distension, no abdominal bruit and no mass. There is no hepatosplenomegaly. There is no tenderness.  Musculoskeletal: She exhibits no edema.  No acute joint changes   Lymphadenopathy:    She has no cervical adenopathy.  Neurological: She is alert. She has normal reflexes.  Skin: Skin is warm and dry. No rash noted.  Psychiatric: She has a normal mood and affect.          Assessment & Plan:   Problem List Items Addressed This Visit    Elevated LFTs    Rev labs Hx of fatty liver Disc imp of wt loss/ low fat diet and avoidance of acetaminophen and etoh   Continue to follow  No symptoms       Essential hypertension - Primary    bp not at goal due to forgetting medication today  Reminded impt of compliance  F/u planned -will return after taking her medications  BP Readings from Last 1 Encounters:  08/11/14 150/96   No changes needed Disc lifstyle change with low sodium diet and exercise        Gout    No flare currently Uric acid was elevated at 8.0  Will begin uloric 40 mg daily (not allopurinol due to elevated LFT from fatty liver) F/u planned -will check uric acid then  Will update if any flares  Rev low purine diet       Hypothyroidism    Hypothyroidism  Pt has no clinical changes No change in energy level/ hair or skin/ edema and no tremor Lab Results  Component Value Date   TSH 2.66 07/22/2014

## 2014-08-20 ENCOUNTER — Encounter: Payer: Self-pay | Admitting: Gastroenterology

## 2014-09-15 ENCOUNTER — Ambulatory Visit (INDEPENDENT_AMBULATORY_CARE_PROVIDER_SITE_OTHER): Payer: Medicare Other | Admitting: Family Medicine

## 2014-09-15 ENCOUNTER — Encounter: Payer: Self-pay | Admitting: Family Medicine

## 2014-09-15 VITALS — BP 138/80 | HR 91 | Temp 98.3°F | Ht 64.0 in | Wt 154.5 lb

## 2014-09-15 DIAGNOSIS — M1A0790 Idiopathic chronic gout, unspecified ankle and foot, without tophus (tophi): Secondary | ICD-10-CM

## 2014-09-15 DIAGNOSIS — I10 Essential (primary) hypertension: Secondary | ICD-10-CM

## 2014-09-15 DIAGNOSIS — R1011 Right upper quadrant pain: Secondary | ICD-10-CM

## 2014-09-15 DIAGNOSIS — R740 Nonspecific elevation of levels of transaminase and lactic acid dehydrogenase [LDH]: Secondary | ICD-10-CM

## 2014-09-15 DIAGNOSIS — R7402 Elevation of levels of lactic acid dehydrogenase (LDH): Secondary | ICD-10-CM

## 2014-09-15 LAB — CBC WITH DIFFERENTIAL/PLATELET
BASOS ABS: 0 10*3/uL (ref 0.0–0.1)
Basophils Relative: 0.4 % (ref 0.0–3.0)
Eosinophils Absolute: 0.1 10*3/uL (ref 0.0–0.7)
Eosinophils Relative: 1.2 % (ref 0.0–5.0)
HCT: 38.2 % (ref 36.0–46.0)
Hemoglobin: 12.5 g/dL (ref 12.0–15.0)
LYMPHS ABS: 1.6 10*3/uL (ref 0.7–4.0)
Lymphocytes Relative: 26.5 % (ref 12.0–46.0)
MCHC: 32.8 g/dL (ref 30.0–36.0)
MCV: 83.4 fl (ref 78.0–100.0)
Monocytes Absolute: 0.5 10*3/uL (ref 0.1–1.0)
Monocytes Relative: 7.8 % (ref 3.0–12.0)
Neutro Abs: 4 10*3/uL (ref 1.4–7.7)
Neutrophils Relative %: 64.1 % (ref 43.0–77.0)
Platelets: 124 10*3/uL — ABNORMAL LOW (ref 150.0–400.0)
RBC: 4.58 Mil/uL (ref 3.87–5.11)
RDW: 15 % (ref 11.5–15.5)
WBC: 6.2 10*3/uL (ref 4.0–10.5)

## 2014-09-15 LAB — BASIC METABOLIC PANEL WITH GFR
BUN: 19 mg/dL (ref 6–23)
CO2: 29 meq/L (ref 19–32)
Calcium: 9.7 mg/dL (ref 8.4–10.5)
Chloride: 104 meq/L (ref 96–112)
Creatinine, Ser: 0.87 mg/dL (ref 0.40–1.20)
GFR: 69.01 mL/min
Glucose, Bld: 146 mg/dL — ABNORMAL HIGH (ref 70–99)
Potassium: 4.2 meq/L (ref 3.5–5.1)
Sodium: 143 meq/L (ref 135–145)

## 2014-09-15 LAB — URIC ACID: Uric Acid, Serum: 3.4 mg/dL (ref 2.4–7.0)

## 2014-09-15 LAB — HEPATIC FUNCTION PANEL
ALT: 44 U/L — ABNORMAL HIGH (ref 0–35)
AST: 69 U/L — ABNORMAL HIGH (ref 0–37)
Albumin: 4.4 g/dL (ref 3.5–5.2)
Alkaline Phosphatase: 80 U/L (ref 39–117)
Bilirubin, Direct: 0 mg/dL (ref 0.0–0.3)
Total Bilirubin: 0.4 mg/dL (ref 0.2–1.2)
Total Protein: 8.1 g/dL (ref 6.0–8.3)

## 2014-09-15 LAB — LIPASE: Lipase: 17 U/L (ref 11.0–59.0)

## 2014-09-15 NOTE — Progress Notes (Signed)
Pre visit review using our clinic review tool, if applicable. No additional management support is needed unless otherwise documented below in the visit note. 

## 2014-09-15 NOTE — Patient Instructions (Signed)
Hold uloric for now to see if it is causing GI side effects Lab today for uric acid and some GI labs  BP is improved Stick to bland diet  If no improvement in stomach in a week please let me know

## 2014-09-15 NOTE — Progress Notes (Signed)
Subjective:    Patient ID: Valerie Ball, female    DOB: 1945-11-03, 69 y.o.   MRN: [id number]  HPI Here for f/u of HTN and gout  Last uric acid was high at 8.0 Started uloric   (avoided allopurinol due to baseline elevated liver enzymes from fatty liver) Gout has been fine   Pt states her stomach has been giving her a "fit" Sore in the epigastric area  Going on for about a week  Hurts when she eats/ gets nauseated  Still takes her prilosec bid -no missed doses  occ she takes a naproxen - but not any time lately (headaches are better)  No vomiting Some diarrhea - often (this tends to come and go) -now every time she eats   No change in her diet  Drinks one cup of coffee in the am   Has had [place] fixed in the past - ? If it could come loose   Now having pain in bladder area when she moves bowels  ? Uti? Hard to tell  Really tired and "yucky"     Last visit bp was high due to forgetting her medication BP Readings from Last 3 Encounters:  09/15/14 142/88  08/11/14 150/96  07/22/14 144/78     Wt is down 2 lb with bmi of 26  Patient Active Problem List   Diagnosis Date Noted  . Gout 07/22/2014  . Hypothyroidism 04/23/2014  . Heme + stool 02/18/2014  . Nausea without vomiting 02/18/2014  . Elevated LFTs 02/18/2014  . Occult blood positive stool 02/03/2014  . Encounter for Medicare annual wellness exam 01/23/2014  . Estrogen deficiency 01/23/2014  . Colon cancer screening 01/23/2014  . Abdominal pain, epigastric 01/20/2014  . Tremor 01/20/2014  . RUQ abdominal pain 01/20/2014  . Dizziness 01/07/2014  . Allergic rhinitis 01/07/2014  . Headache above the eye region 01/07/2014  . Thrombocytopenia, unspecified 11/25/2013  . Diabetic neuropathy 11/25/2013  . History of breast cancer 06/07/2012  . Lap Nissen October 2013 01/19/2012  . Large type III mixed hiatus hernia  12/06/2011  . Stress reaction 10/25/2011  . Routine general medical examination at a health care  facility 07/11/2011  . ADVERSE DRUG REACTION 05/05/2010  . TRANSAMINASES, SERUM, ELEVATED 11/11/2009  . Breast cancer, right breast 08/02/2009  . DIVERTICULOSIS-COLON 03/03/2008  . ESOPHAGEAL STRICTURE 03/02/2008  . HIATAL HERNIA 03/02/2008  . PERITONEAL ADHESIONS 03/02/2008  . Anemia 02/07/2008  . Diabetes type 2, controlled 08/19/2007  . ANXIETY 04/15/2007  . DISORDERS, ORGANIC SLEEP NEC 08/23/2006  . SYNDROME, RESTLESS LEGS 08/23/2006  . SYNDROME, CARPAL TUNNEL 08/23/2006  . Hyperlipidemia LDL goal <100 08/22/2006  . Essential hypertension 08/22/2006  . ALLERGIC RHINITIS 08/22/2006  . GERD 08/22/2006  . Fatty liver 08/22/2006  . SEIZURE DISORDER 08/22/2006   Past Medical History  Diagnosis Date  . Allergy     allegic rhinitis  . Anxiety   . GERD (gastroesophageal reflux disease)   . Hypertension   . Hyperlipidemia   . Seizure disorder   . RLS (restless legs syndrome)   . Arthritis   . Diverticulosis   . GI bleed   . Cataract   . Salmonella 05/2000    renal effects secondary to dehydration  . Hiatal hernia   . Melena 01/2008    anemia transfusion  . Esophageal stricture 02/04/2008  . Borderline diabetic     per patient medical history form  . Menopause     per patient medical history form  .  Postmenopausal hormone therapy     per patient medical history form.  . Blood transfusion   . Osteoporosis   . Colon stricture 01/14/2003  . Diabetes mellitus     pt's medical doctor took her off diabetic meds-blood sugars have not been a problem--and medications were causing pt's sugars to be too low  . Seizures     one seizure several yrs ago --etiology unknown-no problem since  . Anemia   . Breast cancer 02/2009    breast CA L invasive ductal CA(hormone receptor positive.)  . Colon polyp     hyperplastic  . Hepatic steatosis    Past Surgical History  Procedure Laterality Date  . Abdominal hysterectomy  1983  . Ovary surgery  1986    ovarian tumor removed  .  Mastectomy  04/2009    Bilateral for ductal carcinoma on L  . Breast surgery  02/2009    breast biopsy invasive ductal CA-bilateral mastectomies  . Epigastric hernia repair  01/02/2012    Procedure: HERNIA REPAIR EPIGASTRIC ADULT;  Surgeon: Pedro Earls, MD;  Location: [facility];  Service: General;  Laterality: N/A;  Laparoscopic Repair Paraesophageal Hernia, Nissen  . Laparoscopic nissen fundoplication  92/42/6834    Procedure: LAPAROSCOPIC NISSEN FUNDOPLICATION;  Surgeon: Pedro Earls, MD;  Location: [facility];  Service: General;  Laterality: N/A;   History  Substance Use Topics  . Smoking status: Never Smoker   . Smokeless tobacco: Never Used  . Alcohol Use: No   Family History  Problem Relation Age of Onset  . Heart attack Father   . Esophageal cancer Father   . Diabetes Father   . Cancer Father     esophageal  . Breast cancer Sister   . Diabetes Sister   . Cancer Sister     breast  . Hypertension Brother   . Diabetes Brother   . Breast cancer Sister   . Cancer Sister     breast  . Hypertension Mother   . Stroke Mother   . Breast cancer Mother   . Cancer Mother     breast  . Hypertension Sister    Allergies  Allergen Reactions  . Arimidex [Anastrozole]     Joint pain  . Glipizide Other (See Comments)    Hypoglycemia   . Metformin And Related     diarrhea  . Penicillins Rash   Current Outpatient Prescriptions on File Prior to Visit  Medication Sig Dispense Refill  . Cholecalciferol (VITAMIN D-3 PO) Take 1,000 mg by mouth daily.     . enalapril (VASOTEC) 20 MG tablet TAKE 1 TABLET (20 MG TOTAL) BY MOUTH TWICE DAILY 180 tablet 3  . febuxostat (ULORIC) 40 MG tablet Take 1 tablet (40 mg total) by mouth daily. Patient cannot take allopurinol due to liver enzyme elevations 30 tablet 11  . fluticasone (FLONASE) 50 MCG/ACT nasal spray Place 2 sprays into both nostrils daily as needed. Allergies 16 g 11  . Levothyroxine Sodium 25 MCG CAPS Take 1 capsule (25 mcg  total) by mouth daily before breakfast. Take in the morning 1/2 hour before other medicines 30 capsule 5  . Multiple Vitamin (MULTIVITAMIN WITH MINERALS) TABS tablet Take 1 tablet by mouth daily.    Marland Kitchen omeprazole (PRILOSEC) 20 MG capsule TAKE 1 CAPSULE (20 MG TOTAL) BY MOUTH AT BEDTIME. 90 capsule 2  . rOPINIRole (REQUIP) 1 MG tablet TAKE 1 TABLET (1 MG TOTAL) BY MOUTH AT BEDTIME. 90 tablet 2  . sertraline (ZOLOFT) 100  MG tablet TAKE 1 TABLET (100 MG TOTAL) BY MOUTH AT BEDTIME. 90 tablet 2  . ZETIA 10 MG tablet TAKE 1 TABLET (10 MG TOTAL) BY MOUTH DAILY. 90 tablet 2  . [DISCONTINUED] cetirizine (ZYRTEC) 10 MG tablet Take 10 mg by mouth daily as needed. For allergy symptoms    . [DISCONTINUED] hydrochlorothiazide 25 MG tablet Take 25 mg by mouth daily.       No current facility-administered medications on file prior to visit.      Review of Systems Review of Systems  Constitutional: Negative for fever, appetite change, fatigue and unexpected weight change.  Eyes: Negative for pain and visual disturbance.  Respiratory: Negative for cough and shortness of breath.   Cardiovascular: Negative for cp or palpitations    Gastrointestinal: Negative for vomiting/ blood in stool or dark colored stool MSK neg for gout symptoms  Genitourinary: Negative for urgency and frequency.  Skin: Negative for pallor or rash   Neurological: Negative for weakness, light-headedness, numbness and headaches.  Hematological: Negative for adenopathy. Does not bruise/bleed easily.  Psychiatric/Behavioral: Negative for dysphoric mood. The patient is not nervous/anxious.         Objective:   Physical Exam  Constitutional: She appears well-developed and well-nourished. No distress.  HENT:  Head: Normocephalic and atraumatic.  Mouth/Throat: Oropharynx is clear and moist.  Eyes: Conjunctivae and EOM are normal. Pupils are equal, round, and reactive to light.  Neck: Normal range of motion. Neck supple. No JVD present.  Carotid bruit is not present. No thyromegaly present.  Cardiovascular: Normal rate, regular rhythm, normal heart sounds and intact distal pulses.  Exam reveals no gallop.   Pulmonary/Chest: Effort normal and breath sounds normal. No respiratory distress. She has no wheezes. She has no rales.  No crackles  Abdominal: Soft. Bowel sounds are normal. She exhibits no distension, no abdominal bruit and no mass. There is tenderness. There is no rebound and no guarding.  Epigastric and RUQ tenderness without rebound or guarding   Musculoskeletal: She exhibits no edema.  No acute joint changes   Lymphadenopathy:    She has no cervical adenopathy.  Neurological: She is alert. She has normal reflexes.  Skin: Skin is warm and dry. No rash noted.  Psychiatric: She has a normal mood and affect.          Assessment & Plan:   Problem List Items Addressed This Visit    Essential hypertension - Primary    bp in fair control at this time  BP Readings from Last 1 Encounters:  09/15/14 138/80   No changes needed Disc lifstyle change with low sodium diet and exercise  Better on her medication       Gout    No gout flares on uloric  Last uric acid 8.0 However GI upset-? Side eff Lab today and hold it      Relevant Orders   Uric acid (Completed)   RUQ abdominal pain    As well as epigastric with hx of HH repair in past  Also new start of uloric - ? Side eff Will hold it Also labs incl lft and lipase Update       Relevant Orders   CBC with Differential/Platelet (Completed)   Hepatic function panel (Completed)   Basic metabolic panel (Completed)   Lipase (Completed)   TRANSAMINASES, SERUM, ELEVATED    Re check today Pt has some RUQ and also epigastric pain on uloric Also hx of fatty liver-disc diet  Relevant Orders   Hepatic function panel (Completed)

## 2014-09-15 NOTE — Assessment & Plan Note (Signed)
bp in fair control at this time  BP Readings from Last 1 Encounters:  09/15/14 138/80   No changes needed Disc lifstyle change with low sodium diet and exercise  Better on her medication

## 2014-09-15 NOTE — Assessment & Plan Note (Signed)
Re check today Pt has some RUQ and also epigastric pain on uloric Also hx of fatty liver-disc diet

## 2014-09-15 NOTE — Assessment & Plan Note (Signed)
As well as epigastric with hx of HH repair in past  Also new start of uloric - ? Side eff Will hold it Also labs incl lft and lipase Update

## 2014-09-15 NOTE — Assessment & Plan Note (Signed)
No gout flares on uloric  Last uric acid 8.0 However GI upset-? Side eff Lab today and hold it

## 2014-10-05 ENCOUNTER — Encounter: Payer: Self-pay | Admitting: Genetic Counselor

## 2014-10-14 ENCOUNTER — Ambulatory Visit (INDEPENDENT_AMBULATORY_CARE_PROVIDER_SITE_OTHER): Payer: Medicare Other | Admitting: Family Medicine

## 2014-10-14 ENCOUNTER — Telehealth: Payer: Self-pay | Admitting: Family Medicine

## 2014-10-14 ENCOUNTER — Encounter: Payer: Self-pay | Admitting: Family Medicine

## 2014-10-14 VITALS — BP 144/72 | HR 98 | Temp 98.3°F | Ht 64.0 in | Wt 157.0 lb

## 2014-10-14 DIAGNOSIS — K5732 Diverticulitis of large intestine without perforation or abscess without bleeding: Secondary | ICD-10-CM

## 2014-10-14 HISTORY — DX: Diverticulitis of large intestine without perforation or abscess without bleeding: K57.32

## 2014-10-14 LAB — COMPREHENSIVE METABOLIC PANEL
ALT: 39 U/L — ABNORMAL HIGH (ref 0–35)
AST: 66 U/L — ABNORMAL HIGH (ref 0–37)
Albumin: 4.3 g/dL (ref 3.5–5.2)
Alkaline Phosphatase: 87 U/L (ref 39–117)
BUN: 20 mg/dL (ref 6–23)
CO2: 30 mEq/L (ref 19–32)
CREATININE: 0.86 mg/dL (ref 0.40–1.20)
Calcium: 9.6 mg/dL (ref 8.4–10.5)
Chloride: 103 mEq/L (ref 96–112)
GFR: 69.92 mL/min (ref >60.00)
Glucose, Bld: 242 mg/dL — ABNORMAL HIGH (ref 70–99)
POTASSIUM: 4.2 meq/L (ref 3.5–5.1)
Sodium: 141 mEq/L (ref 135–145)
Total Bilirubin: 0.4 mg/dL (ref 0.2–1.2)
Total Protein: 8.2 g/dL (ref 6.0–8.3)

## 2014-10-14 LAB — CBC WITH DIFFERENTIAL/PLATELET
BASOS PCT: 0.2 % (ref 0.0–3.0)
Basophils Absolute: 0 10*3/uL (ref 0.0–0.1)
Eosinophils Absolute: 0 10*3/uL (ref 0.0–0.7)
Eosinophils Relative: 0.9 % (ref 0.0–5.0)
HCT: 36.6 % (ref 36.0–46.0)
HEMOGLOBIN: 12.1 g/dL (ref 12.0–15.0)
Lymphocytes Relative: 22.2 % (ref 12.0–46.0)
Lymphs Abs: 1 10*3/uL (ref 0.7–4.0)
MCHC: 33.2 g/dL (ref 30.0–36.0)
MCV: 82.5 fl (ref 78.0–100.0)
MONOS PCT: 9.1 % (ref 3.0–12.0)
Monocytes Absolute: 0.4 10*3/uL (ref 0.1–1.0)
NEUTROS ABS: 3.1 10*3/uL (ref 1.4–7.7)
Neutrophils Relative %: 67.6 % (ref 43.0–77.0)
PLATELETS: 112 10*3/uL — AB (ref 150.0–400.0)
RBC: 4.44 Mil/uL (ref 3.87–5.11)
RDW: 15.6 % — ABNORMAL HIGH (ref 11.5–15.5)
WBC: 4.5 10*3/uL (ref 4.0–10.5)

## 2014-10-14 MED ORDER — CIPROFLOXACIN HCL 500 MG PO TABS: 500.0000 mg | ORAL_TABLET | Freq: Two times a day (BID) | ORAL | Status: DC

## 2014-10-14 MED ORDER — METRONIDAZOLE 500 MG PO TABS: 500.0000 mg | ORAL_TABLET | Freq: Three times a day (TID) | ORAL | Status: DC

## 2014-10-14 NOTE — Progress Notes (Signed)
Pre visit review using our clinic review tool, if applicable. No additional management support is needed unless otherwise documented below in the visit note. 

## 2014-10-14 NOTE — Patient Instructions (Signed)
I think you have diverticulitis Take the cipro and flagyl  Lab today  Keep me posted- if bleeding becomes worse after hours - go to the ED   Update if not starting to improve in a week or if worsening

## 2014-10-14 NOTE — Telephone Encounter (Signed)
Patient Name: Valerie Ball  DOB: 06-04-1945    Initial Comment Caller states she has blood in her stool. Also nauseated and feeling pain in lower abs   Nurse Assessment  Nurse: Leilani Merl, RN, Heather Date/Time (Eastern Time): 10/14/2014 8:47:59 AM  Confirm and document reason for call. If symptomatic, describe symptoms. ---Caller states she just having blood when she tries to have a BM, no stool visible. This started this morning. Also nauseated and feeling pain in lower abs  Has the patient traveled out of the country within the last 30 days? ---Not Applicable  Does the patient require triage? ---Yes  Related visit to physician within the last 2 weeks? ---No  Does the PT have any chronic conditions? (i.e. diabetes, asthma, etc.) ---Yes  List chronic conditions. ---lots of medical problems, see MR     Guidelines    Guideline Title Affirmed Question Affirmed Notes  Rectal Bleeding Toilet water turned red (Exceptions: turned a little pink, few drops of blood)    Final Disposition User   See Physician within 4 Hours (or PCP triage) Standifer, RN, Nira Conn    Comments  Appt made with Dr. Glori Bickers at 10:15 am today.   Referrals  REFERRED TO PCP OFFICE   Disagree/Comply: Comply

## 2014-10-14 NOTE — Telephone Encounter (Signed)
I will see her then  

## 2014-10-14 NOTE — Telephone Encounter (Signed)
Pt has appt with Dr Glori Bickers 10/14/14 at 10:15 AM.

## 2014-10-14 NOTE — Progress Notes (Signed)
Subjective:    Patient ID: Valerie Ball, female    DOB: 26-Apr-1945, 69 y.o.   MRN: [id number]  HPI Few days ago - started a low grade temp and felt poorly   abd pain and nausea  Pain was all over  More in the lower abdomen  Right more like pressure than pain  Worse to sit   Blood in stool this am  Mostly bright red  A fair amount with stringy dark material as well   Feels lousy in general  Extremely tired  No fever now - feels weak and hot and cold    Had colonosc 1/16  Hyperplastic polyp Tics  Angiodysplastic lesion in ascending colon   Patient Active Problem List   Diagnosis Date Noted  . Diverticulitis of colon 10/14/2014  . Gout 07/22/2014  . Hypothyroidism 04/23/2014  . Heme + stool 02/18/2014  . Nausea without vomiting 02/18/2014  . Elevated LFTs 02/18/2014  . Occult blood positive stool 02/03/2014  . Encounter for Medicare annual wellness exam 01/23/2014  . Estrogen deficiency 01/23/2014  . Colon cancer screening 01/23/2014  . Abdominal pain, epigastric 01/20/2014  . Tremor 01/20/2014  . RUQ abdominal pain 01/20/2014  . Dizziness 01/07/2014  . Allergic rhinitis 01/07/2014  . Headache above the eye region 01/07/2014  . Thrombocytopenia, unspecified 11/25/2013  . Diabetic neuropathy 11/25/2013  . History of breast cancer 06/07/2012  . Lap Nissen October 2013 01/19/2012  . Large type III mixed hiatus hernia  12/06/2011  . Stress reaction 10/25/2011  . Routine general medical examination at a health care facility 07/11/2011  . ADVERSE DRUG REACTION 05/05/2010  . TRANSAMINASES, SERUM, ELEVATED 11/11/2009  . Breast cancer, right breast 08/02/2009  . DIVERTICULOSIS-COLON 03/03/2008  . ESOPHAGEAL STRICTURE 03/02/2008  . HIATAL HERNIA 03/02/2008  . PERITONEAL ADHESIONS 03/02/2008  . Anemia 02/07/2008  . Diabetes type 2, controlled 08/19/2007  . ANXIETY 04/15/2007  . DISORDERS, ORGANIC SLEEP NEC 08/23/2006  . SYNDROME, RESTLESS LEGS 08/23/2006  .  SYNDROME, CARPAL TUNNEL 08/23/2006  . Hyperlipidemia LDL goal <100 08/22/2006  . Essential hypertension 08/22/2006  . ALLERGIC RHINITIS 08/22/2006  . GERD 08/22/2006  . Fatty liver 08/22/2006  . SEIZURE DISORDER 08/22/2006   Past Medical History  Diagnosis Date  . Allergy     allegic rhinitis  . Anxiety   . GERD (gastroesophageal reflux disease)   . Hypertension   . Hyperlipidemia   . Seizure disorder   . RLS (restless legs syndrome)   . Arthritis   . Diverticulosis   . GI bleed   . Cataract   . Salmonella 05/2000    renal effects secondary to dehydration  . Hiatal hernia   . Melena 01/2008    anemia transfusion  . Esophageal stricture 02/04/2008  . Borderline diabetic     per patient medical history form  . Menopause     per patient medical history form  . Postmenopausal hormone therapy     per patient medical history form.  . Blood transfusion   . Osteoporosis   . Colon stricture 01/14/2003  . Diabetes mellitus     pt's medical doctor took her off diabetic meds-blood sugars have not been a problem--and medications were causing pt's sugars to be too low  . Seizures     one seizure several yrs ago --etiology unknown-no problem since  . Anemia   . Breast cancer 02/2009    breast CA L invasive ductal CA(hormone receptor positive.)  . Colon polyp  hyperplastic  . Hepatic steatosis    Past Surgical History  Procedure Laterality Date  . Abdominal hysterectomy  1983  . Ovary surgery  1986    ovarian tumor removed  . Mastectomy  04/2009    Bilateral for ductal carcinoma on L  . Breast surgery  02/2009    breast biopsy invasive ductal CA-bilateral mastectomies  . Epigastric hernia repair  01/02/2012    Procedure: HERNIA REPAIR EPIGASTRIC ADULT;  Surgeon: Pedro Earls, MD;  Location: [facility];  Service: General;  Laterality: N/A;  Laparoscopic Repair Paraesophageal Hernia, Nissen  . Laparoscopic nissen fundoplication  [id number]/[id number]    Procedure: LAPAROSCOPIC  NISSEN FUNDOPLICATION;  Surgeon: Pedro Earls, MD;  Location: [facility];  Service: General;  Laterality: N/A;   History  Substance Use Topics  . Smoking status: Never Smoker   . Smokeless tobacco: Never Used  . Alcohol Use: No   Family History  Problem Relation Age of Onset  . Heart attack Father   . Esophageal cancer Father   . Diabetes Father   . Cancer Father     esophageal  . Breast cancer Sister   . Diabetes Sister   . Cancer Sister     breast  . Hypertension Brother   . Diabetes Brother   . Breast cancer Sister   . Cancer Sister     breast  . Hypertension Mother   . Stroke Mother   . Breast cancer Mother   . Cancer Mother     breast  . Hypertension Sister    Allergies  Allergen Reactions  . Arimidex [Anastrozole]     Joint pain  . Glipizide Other (See Comments)    Hypoglycemia   . Metformin And Related     diarrhea  . Penicillins Rash   Current Outpatient Prescriptions on File Prior to Visit  Medication Sig Dispense Refill  . Cholecalciferol (VITAMIN D-3 PO) Take 1,000 mg by mouth daily.     . enalapril (VASOTEC) 20 MG tablet TAKE 1 TABLET (20 MG TOTAL) BY MOUTH TWICE DAILY 180 tablet 3  . fluticasone (FLONASE) 50 MCG/ACT nasal spray Place 2 sprays into both nostrils daily as needed. Allergies 16 g 11  . Levothyroxine Sodium 25 MCG CAPS Take 1 capsule (25 mcg total) by mouth daily before breakfast. Take in the morning 1/2 hour before other medicines 30 capsule 5  . Multiple Vitamin (MULTIVITAMIN WITH MINERALS) TABS tablet Take 1 tablet by mouth daily.    Marland Kitchen omeprazole (PRILOSEC) 20 MG capsule TAKE 1 CAPSULE (20 MG TOTAL) BY MOUTH AT BEDTIME. 90 capsule 2  . rOPINIRole (REQUIP) 1 MG tablet TAKE 1 TABLET (1 MG TOTAL) BY MOUTH AT BEDTIME. 90 tablet 2  . sertraline (ZOLOFT) 100 MG tablet TAKE 1 TABLET (100 MG TOTAL) BY MOUTH AT BEDTIME. 90 tablet 2  . ZETIA 10 MG tablet TAKE 1 TABLET (10 MG TOTAL) BY MOUTH DAILY. 90 tablet 2  . [DISCONTINUED] cetirizine (ZYRTEC)  10 MG tablet Take 10 mg by mouth daily as needed. For allergy symptoms    . [DISCONTINUED] hydrochlorothiazide 25 MG tablet Take 25 mg by mouth daily.       No current facility-administered medications on file prior to visit.    Review of Systems Review of Systems  Constitutional: Negative for , appetite change,  and unexpected weight change.  Eyes: Negative for pain and visual disturbance.  Respiratory: Negative for cough and shortness of breath.   Cardiovascular: Negative for cp or palpitations  Gastrointestinal: Negative for black stool, rectal pain or vomiting  Genitourinary: Negative for urgency and frequency.  Skin: Negative for pallor or rash   Neurological: Negative for weakness, light-headedness, numbness and headaches.  Hematological: Negative for adenopathy. Does not bruise/bleed easily.  Psychiatric/Behavioral: Negative for dysphoric mood. The patient is not nervous/anxious.         Objective:   Physical Exam  Constitutional: She appears well-developed and well-nourished. No distress.  overwt and well app  HENT:  Head: Normocephalic and atraumatic.  Mouth/Throat: Oropharynx is clear and moist.  Eyes: Conjunctivae and EOM are normal. Pupils are equal, round, and reactive to light. No scleral icterus.  Neck: Normal range of motion. Neck supple.  Cardiovascular: Normal rate, regular rhythm and normal heart sounds.   Pulmonary/Chest: Effort normal and breath sounds normal. No respiratory distress. She has no wheezes. She has no rales.  Abdominal: Soft. Bowel sounds are normal. She exhibits no distension, no pulsatile liver, no abdominal bruit, no pulsatile midline mass and no mass. There is no hepatosplenomegaly. There is tenderness in the right lower quadrant and left lower quadrant. There is no rigidity, no rebound, no guarding, no CVA tenderness, no tenderness at McBurney's point and negative Murphy's sign.  Genitourinary:  Nl rectal exam Heme pos stool     Musculoskeletal: She exhibits no edema.  Lymphadenopathy:    She has no cervical adenopathy.  Neurological: She is alert.  Skin: Skin is warm and dry. No erythema. No pallor.  Psychiatric: She has a normal mood and affect.          Assessment & Plan:   Problem List Items Addressed This Visit    Diverticulitis of colon - Primary    Pt has lower abd pain worse on L with malaise and low grade temp (also blood in stool) Cover for diverticulitis with cipro and flagyl  Lab today  Also hx of angiodysplastic lesion in colon- will watch for more bleeding  Consider imaging if worse or no improvement       Relevant Medications   ciprofloxacin (CIPRO) 500 MG tablet   metroNIDAZOLE (FLAGYL) 500 MG tablet   Other Relevant Orders   CBC with Differential/Platelet (Completed)   Comprehensive metabolic panel (Completed)

## 2014-10-15 NOTE — Assessment & Plan Note (Signed)
Pt has lower abd pain worse on L with malaise and low grade temp (also blood in stool) Cover for diverticulitis with cipro and flagyl  Lab today  Also hx of angiodysplastic lesion in colon- will watch for more bleeding  Consider imaging if worse or no improvement

## 2014-10-22 ENCOUNTER — Encounter: Payer: Self-pay | Admitting: *Deleted

## 2014-11-15 ENCOUNTER — Other Ambulatory Visit: Payer: Self-pay | Admitting: Family Medicine

## 2014-12-08 ENCOUNTER — Ambulatory Visit (INDEPENDENT_AMBULATORY_CARE_PROVIDER_SITE_OTHER): Payer: Medicare Other | Admitting: Family Medicine

## 2014-12-08 ENCOUNTER — Ambulatory Visit (INDEPENDENT_AMBULATORY_CARE_PROVIDER_SITE_OTHER)
Admission: RE | Admit: 2014-12-08 | Discharge: 2014-12-08 | Disposition: A | Discharge status: Home / Self Care | Payer: Medicare Other | Source: Ambulatory Visit | Attending: Family Medicine | Admitting: Family Medicine

## 2014-12-08 ENCOUNTER — Encounter: Payer: Self-pay | Admitting: Family Medicine

## 2014-12-08 VITALS — BP 166/94 | HR 107 | Temp 98.5°F | Ht 64.0 in | Wt 155.5 lb

## 2014-12-08 DIAGNOSIS — R05 Cough: Secondary | ICD-10-CM

## 2014-12-08 DIAGNOSIS — R112 Nausea with vomiting, unspecified: Secondary | ICD-10-CM | POA: Diagnosis not present

## 2014-12-08 DIAGNOSIS — R059 Cough, unspecified: Secondary | ICD-10-CM

## 2014-12-08 LAB — CBC WITH DIFFERENTIAL/PLATELET
Basophils Absolute: 0 10*3/uL (ref 0.0–0.1)
Basophils Relative: 0 % (ref 0.0–3.0)
EOS ABS: 0.1 10*3/uL (ref 0.0–0.7)
Eosinophils Relative: 0.5 % (ref 0.0–5.0)
HCT: 39.3 % (ref 36.0–46.0)
Hemoglobin: 12.9 g/dL (ref 12.0–15.0)
LYMPHS ABS: 1.6 10*3/uL (ref 0.7–4.0)
Lymphocytes Relative: 13.1 % (ref 12.0–46.0)
MCHC: 32.8 g/dL (ref 30.0–36.0)
MCV: 77.6 fl — AB (ref 78.0–100.0)
MONO ABS: 0.8 10*3/uL (ref 0.1–1.0)
MONOS PCT: 6.1 % (ref 3.0–12.0)
NEUTROS PCT: 80.3 % — AB (ref 43.0–77.0)
Neutro Abs: 9.9 10*3/uL — ABNORMAL HIGH (ref 1.4–7.7)
Platelets: 186 10*3/uL (ref 150.0–400.0)
RBC: 5.07 Mil/uL (ref 3.87–5.11)
RDW: 16.2 % — ABNORMAL HIGH (ref 11.5–15.5)
WBC: 12.4 10*3/uL — ABNORMAL HIGH (ref 4.0–10.5)

## 2014-12-08 LAB — COMPREHENSIVE METABOLIC PANEL
ALT: 33 U/L (ref 0–35)
AST: 48 U/L — AB (ref 0–37)
Albumin: 4.6 g/dL (ref 3.5–5.2)
Alkaline Phosphatase: 87 U/L (ref 39–117)
BILIRUBIN TOTAL: 0.6 mg/dL (ref 0.2–1.2)
BUN: 18 mg/dL (ref 6–23)
CO2: 29 mEq/L (ref 19–32)
CREATININE: 0.85 mg/dL (ref 0.40–1.20)
Calcium: 10.1 mg/dL (ref 8.4–10.5)
Chloride: 101 mEq/L (ref 96–112)
GFR: 70.84 mL/min (ref >60.00)
GLUCOSE: 169 mg/dL — AB (ref 70–99)
Potassium: 4.2 mEq/L (ref 3.5–5.1)
SODIUM: 141 meq/L (ref 135–145)
TOTAL PROTEIN: 9 g/dL — AB (ref 6.0–8.3)

## 2014-12-08 LAB — AMYLASE: Amylase: 47 U/L (ref 27–131)

## 2014-12-08 LAB — LIPASE: Lipase: 18 U/L (ref 11.0–59.0)

## 2014-12-08 MED ORDER — PROMETHAZINE HCL 25 MG PO TABS: 25.0000 mg | ORAL_TABLET | Freq: Three times a day (TID) | ORAL | Status: DC | PRN

## 2014-12-08 MED ORDER — LOSARTAN POTASSIUM 100 MG PO TABS: 100.0000 mg | ORAL_TABLET | Freq: Every day | ORAL | Status: DC

## 2014-12-08 NOTE — Progress Notes (Signed)
Pre visit review using our clinic review tool, if applicable. No additional management support is needed unless otherwise documented below in the visit note. 

## 2014-12-08 NOTE — Progress Notes (Signed)
Subjective:    Patient ID: Valerie Ball, female    DOB: 05-28-1945, 69 y.o.   MRN: [id number]  HPI Here with cough times  5 weeks   Started on vacation - developed a cough  Thought it may have been a sinus infection   Post nasal drainage -feels like something is stuck in her throat (not sore) Coughs up white mucous  Does not feel rattle in chest  Almost wheezing last night  No sinus pain (did have it)  Ears feel ok  No nasal symptoms right now    Last night - she vomited once -several times since  Not related to the cough ? Unsure  Is nauseated  Some abdominal soreness  ? If GERD /indigestion   Omeprazole 2 daily usually has her controlled   Patient Active Problem List   Diagnosis Date Noted  . Cough 12/08/2014  . Nausea & vomiting 12/08/2014  . Diverticulitis of colon 10/14/2014  . Gout 07/22/2014  . Hypothyroidism 04/23/2014  . Heme + stool 02/18/2014  . Nausea without vomiting 02/18/2014  . Elevated LFTs 02/18/2014  . Occult blood positive stool 02/03/2014  . Encounter for Medicare annual wellness exam 01/23/2014  . Estrogen deficiency 01/23/2014  . Colon cancer screening 01/23/2014  . Abdominal pain, epigastric 01/20/2014  . Tremor 01/20/2014  . RUQ abdominal pain 01/20/2014  . Dizziness 01/07/2014  . Allergic rhinitis 01/07/2014  . Headache above the eye region 01/07/2014  . Thrombocytopenia, unspecified 11/25/2013  . Diabetic neuropathy 11/25/2013  . History of breast cancer 06/07/2012  . Lap Nissen October 2013 01/19/2012  . Large type III mixed hiatus hernia  12/06/2011  . Stress reaction 10/25/2011  . Routine general medical examination at a health care facility 07/11/2011  . ADVERSE DRUG REACTION 05/05/2010  . TRANSAMINASES, SERUM, ELEVATED 11/11/2009  . Breast cancer, right breast 08/02/2009  . DIVERTICULOSIS-COLON 03/03/2008  . ESOPHAGEAL STRICTURE 03/02/2008  . HIATAL HERNIA 03/02/2008  . PERITONEAL ADHESIONS 03/02/2008  . Anemia  02/07/2008  . Diabetes type 2, controlled 08/19/2007  . ANXIETY 04/15/2007  . DISORDERS, ORGANIC SLEEP NEC 08/23/2006  . SYNDROME, RESTLESS LEGS 08/23/2006  . SYNDROME, CARPAL TUNNEL 08/23/2006  . Hyperlipidemia LDL goal <100 08/22/2006  . Essential hypertension 08/22/2006  . ALLERGIC RHINITIS 08/22/2006  . GERD 08/22/2006  . Fatty liver 08/22/2006  . SEIZURE DISORDER 08/22/2006   Past Medical History  Diagnosis Date  . Allergy     allegic rhinitis  . Anxiety   . GERD (gastroesophageal reflux disease)   . Hypertension   . Hyperlipidemia   . Seizure disorder   . RLS (restless legs syndrome)   . Arthritis   . Diverticulosis   . GI bleed   . Cataract   . Salmonella 05/2000    renal effects secondary to dehydration  . Hiatal hernia   . Melena 01/2008    anemia transfusion  . Esophageal stricture 02/04/2008  . Borderline diabetic     per patient medical history form  . Menopause     per patient medical history form  . Postmenopausal hormone therapy     per patient medical history form.  . Blood transfusion   . Osteoporosis   . Colon stricture 01/14/2003  . Diabetes mellitus     pt's medical doctor took her off diabetic meds-blood sugars have not been a problem--and medications were causing pt's sugars to be too low  . Seizures     one seizure several yrs ago --etiology unknown-no problem  since  . Anemia   . Breast cancer 02/2009    breast CA L invasive ductal CA(hormone receptor positive.)  . Colon polyp     hyperplastic  . Hepatic steatosis    Past Surgical History  Procedure Laterality Date  . Abdominal hysterectomy  1983  . Ovary surgery  1986    ovarian tumor removed  . Mastectomy  04/2009    Bilateral for ductal carcinoma on L  . Breast surgery  02/2009    breast biopsy invasive ductal CA-bilateral mastectomies  . Epigastric hernia repair  01/02/2012    Procedure: HERNIA REPAIR EPIGASTRIC ADULT;  Surgeon: Pedro Earls, MD;  Location: [facility];   Service: General;  Laterality: N/A;  Laparoscopic Repair Paraesophageal Hernia, Nissen  . Laparoscopic nissen fundoplication  [id number]    Procedure: LAPAROSCOPIC NISSEN FUNDOPLICATION;  Surgeon: Pedro Earls, MD;  Location: [facility];  Service: General;  Laterality: N/A;   Social History  Substance Use Topics  . Smoking status: Never Smoker   . Smokeless tobacco: Never Used  . Alcohol Use: No   Family History  Problem Relation Age of Onset  . Heart attack Father   . Esophageal cancer Father   . Diabetes Father   . Cancer Father     esophageal  . Breast cancer Sister   . Diabetes Sister   . Cancer Sister     breast  . Hypertension Brother   . Diabetes Brother   . Breast cancer Sister   . Cancer Sister     breast  . Hypertension Mother   . Stroke Mother   . Breast cancer Mother   . Cancer Mother     breast  . Hypertension Sister    Allergies  Allergen Reactions  . Arimidex [Anastrozole]     Joint pain  . Glipizide Other (See Comments)    Hypoglycemia   . Metformin And Related     diarrhea  . Penicillins Rash   Current Outpatient Prescriptions on File Prior to Visit  Medication Sig Dispense Refill  . Cholecalciferol (VITAMIN D-3 PO) Take 1,000 mg by mouth daily.     . fluticasone (FLONASE) 50 MCG/ACT nasal spray Place 2 sprays into both nostrils daily as needed. Allergies 16 g 11  . levothyroxine (SYNTHROID, LEVOTHROID) 25 MCG tablet TAKE 1 TABLET BY MOUTH DAILY BEFORE BREAKFAST, 1/2 HOUR BEFORE TAKING OTHER MEDICINES 30 tablet 2  . Multiple Vitamin (MULTIVITAMIN WITH MINERALS) TABS tablet Take 1 tablet by mouth daily.    Marland Kitchen omeprazole (PRILOSEC) 20 MG capsule TAKE 1 CAPSULE (20 MG TOTAL) BY MOUTH AT BEDTIME. 90 capsule 2  . rOPINIRole (REQUIP) 1 MG tablet TAKE 1 TABLET (1 MG TOTAL) BY MOUTH AT BEDTIME. 90 tablet 2  . sertraline (ZOLOFT) 100 MG tablet TAKE 1 TABLET (100 MG TOTAL) BY MOUTH AT BEDTIME. 90 tablet 2  . ZETIA 10 MG tablet TAKE 1 TABLET (10 MG TOTAL) BY  MOUTH DAILY. 90 tablet 2  . [DISCONTINUED] cetirizine (ZYRTEC) 10 MG tablet Take 10 mg by mouth daily as needed. For allergy symptoms    . [DISCONTINUED] hydrochlorothiazide 25 MG tablet Take 25 mg by mouth daily.       No current facility-administered medications on file prior to visit.     Review of Systems Review of Systems  Constitutional: Negative for fever, appetite change,  and unexpected weight change.  Eyes: Negative for pain and visual disturbance.  ENT pos for post nasal drip / neg for ST  Respiratory: Negative for cough and shortness of breath.   Cardiovascular: Negative for cp or palpitations    Gastrointestinal: Negative for , diarrhea and constipation. pos for hearburn  Genitourinary: Negative for urgency and frequency. neg for dysuria or hematuria  Skin: Negative for pallor or rash   Neurological: Negative for weakness, light-headedness, numbness and headaches.  Hematological: Negative for adenopathy. Does not bruise/bleed easily.  Psychiatric/Behavioral: Negative for dysphoric mood. The patient is not nervous/anxious.         Objective:   Physical Exam  Constitutional: She appears well-developed and well-nourished. No distress.  overwt and well appearing   HENT:  Head: Normocephalic and atraumatic.  Right Ear: External ear normal.  Left Ear: External ear normal.  Mouth/Throat: Oropharynx is clear and moist.  Nares are injected and congested  No sinus tenderness Clear rhinorrhea and post nasal drip   Eyes: Conjunctivae and EOM are normal. Pupils are equal, round, and reactive to light. Right eye exhibits no discharge. Left eye exhibits no discharge. No scleral icterus.  Neck: Normal range of motion. Neck supple.  Cardiovascular: Normal rate, regular rhythm and normal heart sounds.   Pulmonary/Chest: Effort normal and breath sounds normal. No respiratory distress. She has no wheezes. She has no rales. She exhibits no tenderness.  Abdominal: Soft. Bowel sounds are  normal. She exhibits no distension and no mass. There is no hepatosplenomegaly. There is tenderness in the epigastric area. There is no rebound, no guarding, no CVA tenderness, no tenderness at McBurney's point and negative Murphy's sign.  Very mild epigastric tenderness   Lymphadenopathy:    She has no cervical adenopathy.  Neurological: She is alert.  Skin: Skin is warm and dry. No rash noted. No erythema. No pallor.  Psychiatric: She has a normal mood and affect.          Assessment & Plan:   Problem List Items Addressed This Visit      Digestive   Nausea & vomiting    Unsure if rel to cough or recent diverticulitis Lab today  Phenergan px  Disc s/s of dehydration to watch for - will follow  Enc oral rehydration once nausea is controlled       Relevant Orders   CBC with Differential/Platelet (Completed)   Comprehensive metabolic panel (Completed)   Amylase (Completed)   Lipase (Completed)     Other   Cough - Primary    ? If rel to uri or allergies or ace Will change ace to arb cxr today Lab today  ? If rel to n/v Update if not starting to improve in a week or if worsening          Relevant Orders   DG Chest 2 View (Completed)

## 2014-12-08 NOTE — Patient Instructions (Signed)
Lab today for nausea and vomiting  Take an extra omeprazole when you get home  Try the phenergan with caution (of sedation) for nausea  Hold the enalapril- it may add to cough Take losartan 100 mg daily instead  Drink fluids  If worse abdominal pain -please let me know  Chest xray today - we will email you when it is read adn make a plan

## 2014-12-09 ENCOUNTER — Telehealth: Payer: Self-pay | Admitting: Family Medicine

## 2014-12-09 MED ORDER — AZITHROMYCIN 250 MG PO TABS: ORAL_TABLET | ORAL | Status: DC

## 2014-12-09 NOTE — Telephone Encounter (Signed)
Pt notified of lab results and Dr. Marliss Coots comments/ instructions and Rx sent to pharmacy, pt will update [place] if GI sxs worsen if not she will update [place] on Friday on how she is doing

## 2014-12-09 NOTE — Telephone Encounter (Signed)
Pt let v/m returning call and request cb.

## 2014-12-09 NOTE — Telephone Encounter (Signed)
Labs ok but wbc count is elevated - unsure from GI or lung source  Please call in zithromax - which would cover bronchitis  If her GI symptoms worsen however-please let me know  Update me Friday with how you are feeling

## 2014-12-09 NOTE — Telephone Encounter (Signed)
Left voicemail requesting pt to call office back 

## 2014-12-10 NOTE — Assessment & Plan Note (Signed)
Unsure if rel to cough or recent diverticulitis Lab today  Phenergan px  Disc s/s of dehydration to watch for - will follow  Enc oral rehydration once nausea is controlled

## 2014-12-10 NOTE — Assessment & Plan Note (Signed)
?   If rel to uri or allergies or ace Will change ace to arb cxr today Lab today  ? If rel to n/v Update if not starting to improve in a week or if worsening

## 2014-12-22 IMAGING — CT CT ABD-PELV W/ CM
2 of 5 series · 16 of 46 positions shown, 18 images · IV contrast (omnipaque)
Comparison: 09/20/2011

CLINICAL DATA: 10 pound weight loss with increasing abdominal
girth. Nausea, vomiting, diarrhea, heme-positive stools. Abdominal
discomfort.

EXAM:
CT ABDOMEN AND PELVIS WITH CONTRAST
TECHNIQUE: Multidetector CT imaging of the abdomen and pelvis was performed
using the standard protocol following bolus administration of
intravenous contrast.
CONTRAST:  80mL OMNIPAQUE IOHEXOL 300 MG/ML  SOLN

[Series 2: abd/ pelvis 5.0 i30f 1 · axial · 0.74mm/px · z∈[-748,-288]mm · 13 of 104 slices shown, 15 images]
[im 6/104  soft-tissue]
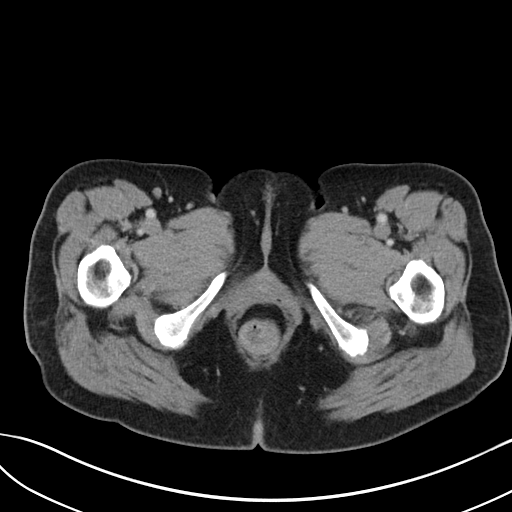
[im 6/104  bone]
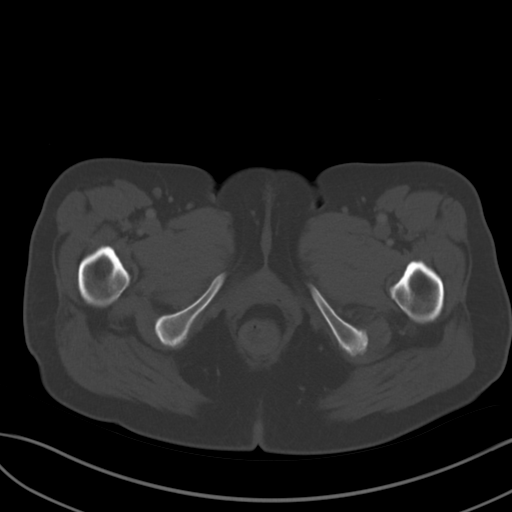
[im 16/104  soft-tissue]
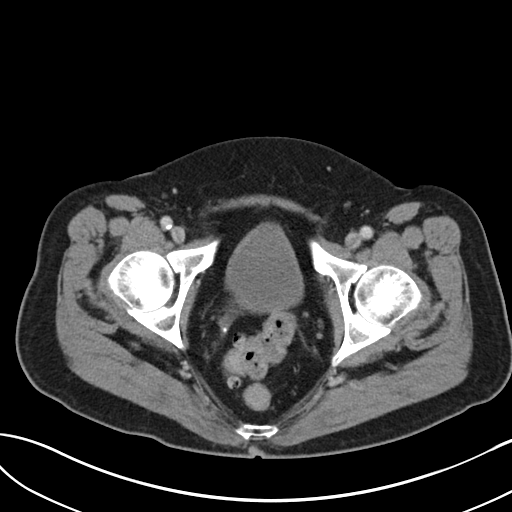
[im 21/104  soft-tissue]
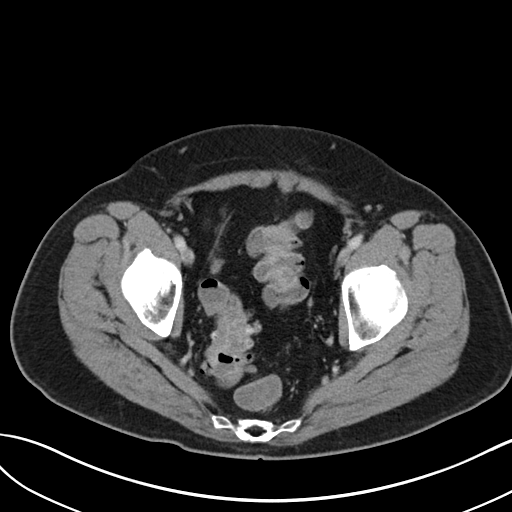
[im 31/104  soft-tissue]
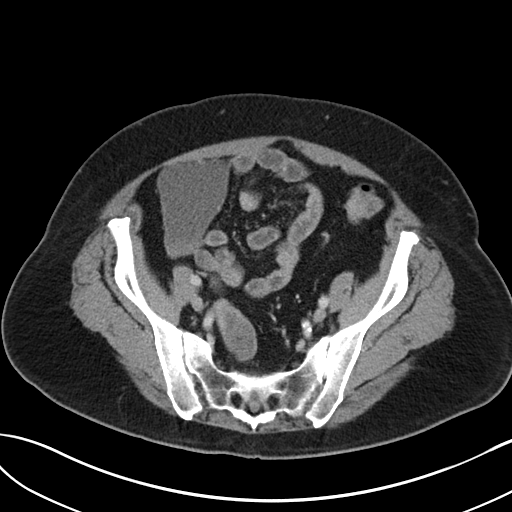
[im 37/104  soft-tissue]
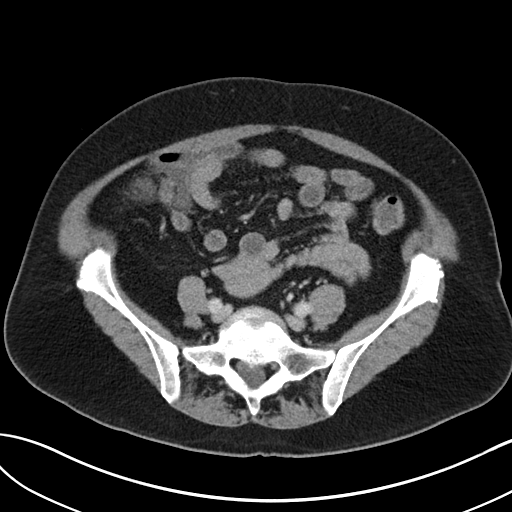
[im 47/104  soft-tissue]
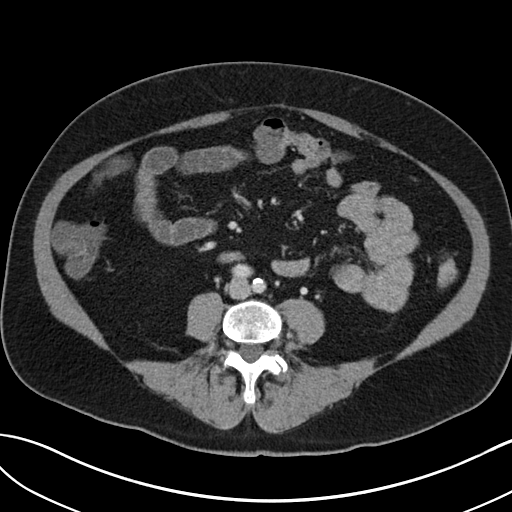
[im 52/104  soft-tissue]
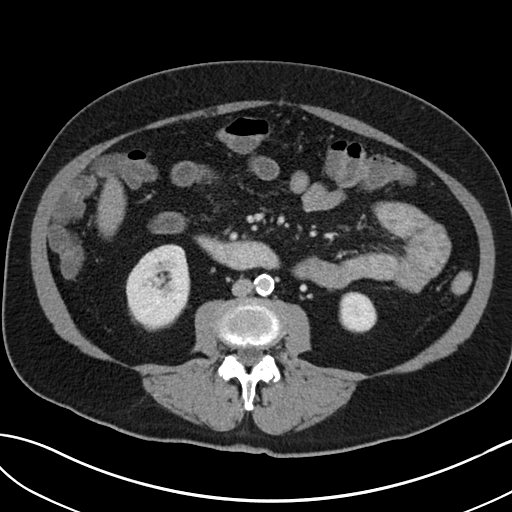
[im 57/104  soft-tissue]
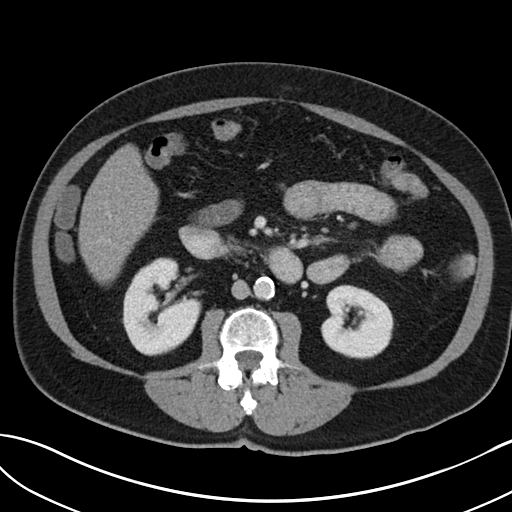
[im 67/104  soft-tissue]
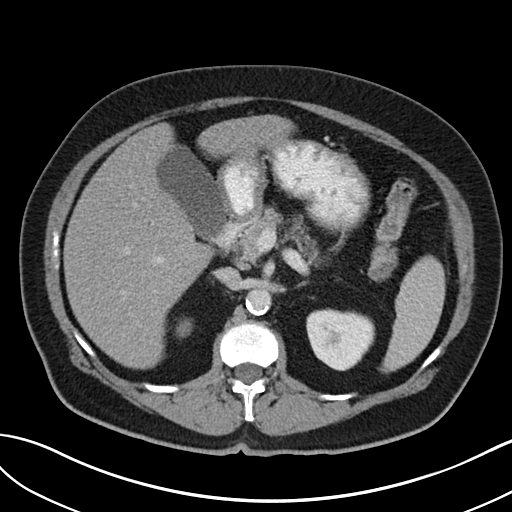
[im 67/104  bone]
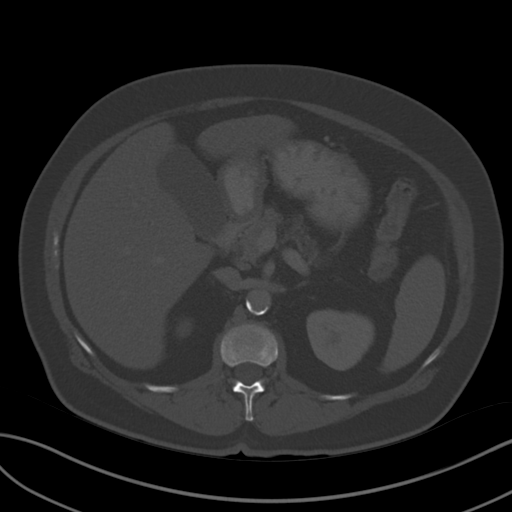
[im 73/104  soft-tissue]
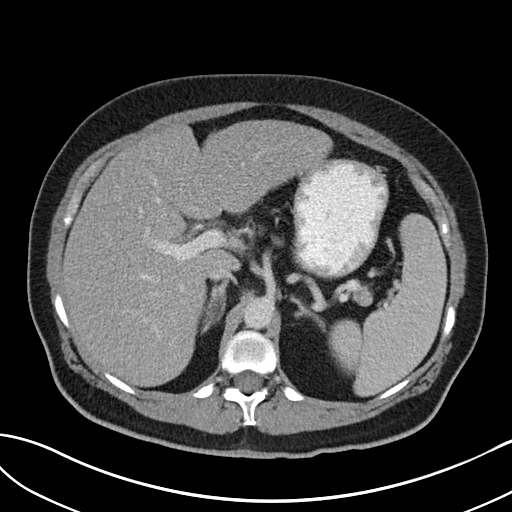
[im 83/104  soft-tissue]
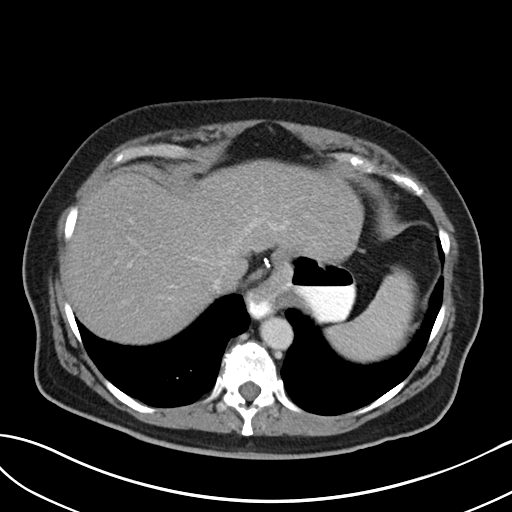
[im 88/104  soft-tissue]
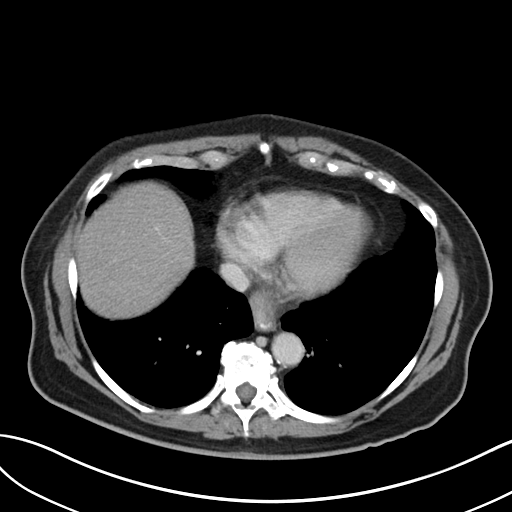
[im 98/104  soft-tissue]
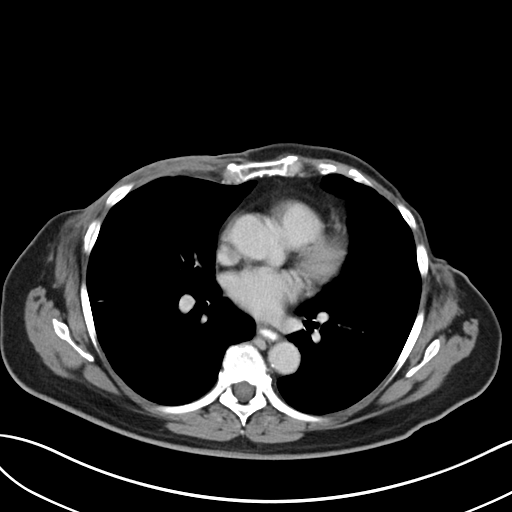

[Series 5: coronal soft tissue · coronal · 0.79mm/px · 3 of 101 slices shown]
[im 34/101  soft-tissue]
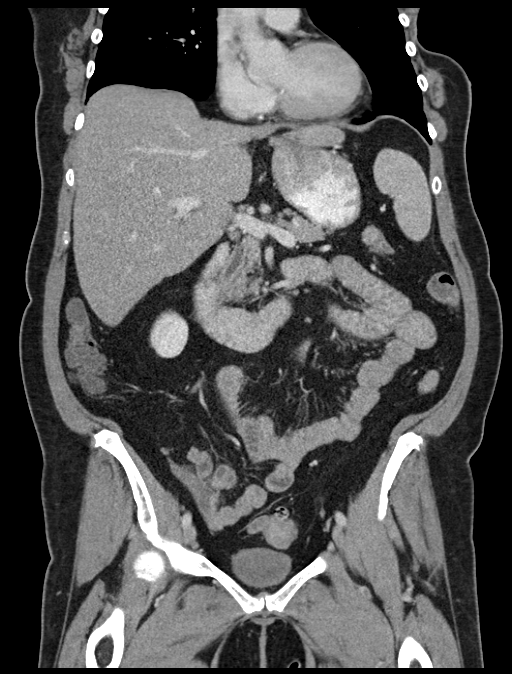
[im 45/101  soft-tissue]
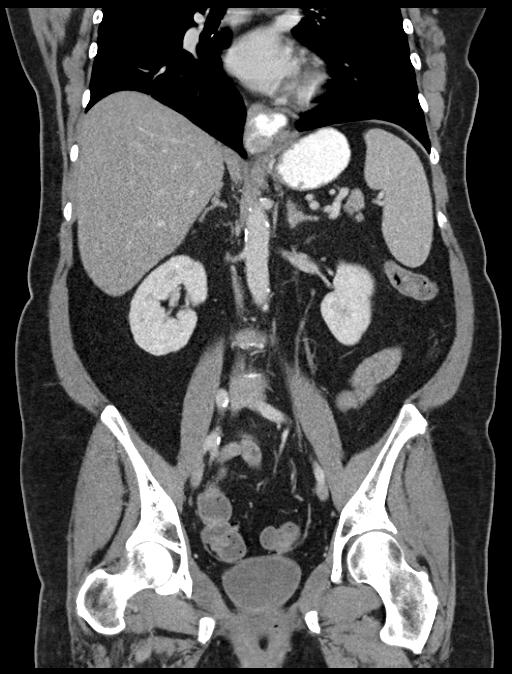
[im 56/101  soft-tissue]
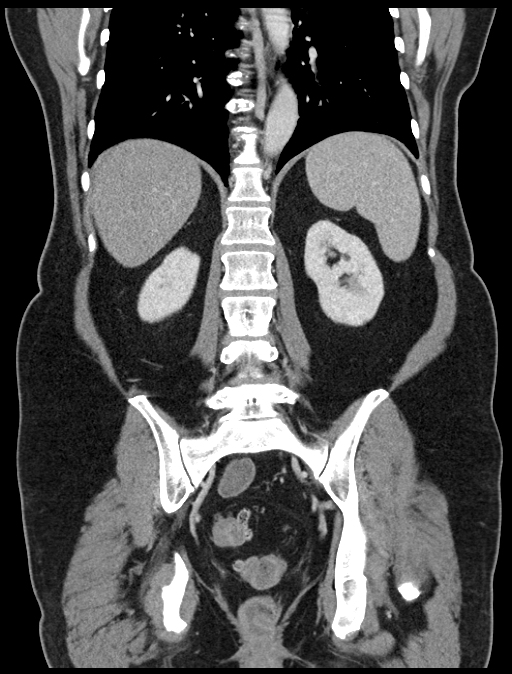

[16 of 46 positions shown; findings below may reference images not displayed]

FINDINGS: Lung bases are clear. Esophageal hiatal hernia with residual
contrast material in the lower esophagus suggesting reflux or
dysmotility. Surgical clips at the GE junction suggesting previous
fundoplication.

Diffuse fatty infiltration of the liver. The gallbladder, spleen,
adrenal glands, pancreas, inferior vena cava, and retroperitoneal
lymph nodes are unremarkable. Calcification of the aorta without
aneurysm. Subcentimeter parenchymal cysts in the kidneys. No
hydronephrosis or solid mass. Stomach is not abnormally distended.
The small bowel appear unremarkable. Fluid filled colon without
distention or wall thickening. This is suggestive of diarrhea and
may indicate colonic inflammatory process. No free air or free fluid
in the abdomen.

Pelvis: Appendix is normal. Diverticulosis of sigmoid colon without
evidence of diverticulitis. Uterus is surgically absent. No free or
loculated pelvic fluid collections. No pelvic mass or
lymphadenopathy. With mild degenerative changes in the lumbar spine.
No destructive bone lesions.
IMPRESSION: Liquid stool in a colon consistent with diarrhea. Diffuse fatty
infiltration of the liver. No abdominal mass or lymphadenopathy.
Esophageal hiatal hernia with evidence of esophageal dysmotility
versus reflux.

## 2015-01-21 ENCOUNTER — Telehealth: Payer: Self-pay | Admitting: Family Medicine

## 2015-01-21 DIAGNOSIS — E119 Type 2 diabetes mellitus without complications: Secondary | ICD-10-CM

## 2015-01-21 DIAGNOSIS — E785 Hyperlipidemia, unspecified: Secondary | ICD-10-CM

## 2015-01-21 DIAGNOSIS — E039 Hypothyroidism, unspecified: Secondary | ICD-10-CM

## 2015-01-21 DIAGNOSIS — I10 Essential (primary) hypertension: Secondary | ICD-10-CM

## 2015-01-21 NOTE — Telephone Encounter (Signed)
-----   Message from Ellamae Sia sent at 01/19/2015  8:45 AM EDT ----- Regarding: Lab orders for Friday, 11.4.16 Patient is scheduled for CPX labs, please order future labs, Thanks , Karna Christmas

## 2015-01-22 ENCOUNTER — Other Ambulatory Visit (INDEPENDENT_AMBULATORY_CARE_PROVIDER_SITE_OTHER): Payer: Medicare Other

## 2015-01-22 DIAGNOSIS — E785 Hyperlipidemia, unspecified: Secondary | ICD-10-CM | POA: Diagnosis not present

## 2015-01-22 DIAGNOSIS — E039 Hypothyroidism, unspecified: Secondary | ICD-10-CM | POA: Diagnosis not present

## 2015-01-22 DIAGNOSIS — E119 Type 2 diabetes mellitus without complications: Secondary | ICD-10-CM | POA: Diagnosis not present

## 2015-01-22 DIAGNOSIS — I10 Essential (primary) hypertension: Secondary | ICD-10-CM

## 2015-01-22 LAB — COMPREHENSIVE METABOLIC PANEL
ALBUMIN: 4.1 g/dL (ref 3.5–5.2)
ALT: 29 U/L (ref 0–35)
AST: 46 U/L — AB (ref 0–37)
Alkaline Phosphatase: 77 U/L (ref 39–117)
BILIRUBIN TOTAL: 0.3 mg/dL (ref 0.2–1.2)
BUN: 16 mg/dL (ref 6–23)
CALCIUM: 9.9 mg/dL (ref 8.4–10.5)
CHLORIDE: 102 meq/L (ref 96–112)
CO2: 29 meq/L (ref 19–32)
CREATININE: 0.78 mg/dL (ref 0.40–1.20)
GFR: 78.2 mL/min (ref >60.00)
Glucose, Bld: 150 mg/dL — ABNORMAL HIGH (ref 70–99)
Potassium: 4.2 mEq/L (ref 3.5–5.1)
SODIUM: 141 meq/L (ref 135–145)
Total Protein: 7.7 g/dL (ref 6.0–8.3)

## 2015-01-22 LAB — CBC WITH DIFFERENTIAL/PLATELET
BASOS ABS: 0 10*3/uL (ref 0.0–0.1)
Basophils Relative: 0.3 % (ref 0.0–3.0)
EOS ABS: 0.1 10*3/uL (ref 0.0–0.7)
Eosinophils Relative: 1.1 % (ref 0.0–5.0)
HCT: 33 % — ABNORMAL LOW (ref 36.0–46.0)
Hemoglobin: 10.7 g/dL — ABNORMAL LOW (ref 12.0–15.0)
LYMPHS ABS: 1.2 10*3/uL (ref 0.7–4.0)
Lymphocytes Relative: 25.9 % (ref 12.0–46.0)
MCHC: 32.3 g/dL (ref 30.0–36.0)
MCV: 76.8 fl — ABNORMAL LOW (ref 78.0–100.0)
Monocytes Absolute: 0.4 10*3/uL (ref 0.1–1.0)
Monocytes Relative: 7.3 % (ref 3.0–12.0)
NEUTROS ABS: 3.1 10*3/uL (ref 1.4–7.7)
Neutrophils Relative %: 65.4 % (ref 43.0–77.0)
RBC: 4.3 Mil/uL (ref 3.87–5.11)
RDW: 17.9 % — ABNORMAL HIGH (ref 11.5–15.5)
WBC: 4.8 10*3/uL (ref 4.0–10.5)

## 2015-01-22 LAB — LIPID PANEL
CHOL/HDL RATIO: 5
Cholesterol: 145 mg/dL (ref 0–200)
HDL: 28.8 mg/dL — ABNORMAL LOW (ref >39.00)
NonHDL: 115.72
Triglycerides: 232 mg/dL — ABNORMAL HIGH (ref 0.0–149.0)
VLDL: 46.4 mg/dL — AB (ref 0.0–40.0)

## 2015-01-22 LAB — TSH: TSH: 4.58 u[IU]/mL — AB (ref 0.35–4.50)

## 2015-01-22 LAB — HEMOGLOBIN A1C: Hgb A1c MFr Bld: 7.3 % — ABNORMAL HIGH (ref 4.6–6.5)

## 2015-01-22 LAB — LDL CHOLESTEROL, DIRECT: LDL DIRECT: 96 mg/dL

## 2015-01-26 ENCOUNTER — Encounter: Payer: Self-pay | Admitting: Family Medicine

## 2015-01-26 ENCOUNTER — Ambulatory Visit (INDEPENDENT_AMBULATORY_CARE_PROVIDER_SITE_OTHER): Payer: Medicare Other | Admitting: Family Medicine

## 2015-01-26 VITALS — BP 164/98 | HR 94 | Temp 98.3°F | Ht 63.5 in | Wt 155.8 lb

## 2015-01-26 DIAGNOSIS — Z23 Encounter for immunization: Secondary | ICD-10-CM

## 2015-01-26 DIAGNOSIS — D649 Anemia, unspecified: Secondary | ICD-10-CM

## 2015-01-26 DIAGNOSIS — E039 Hypothyroidism, unspecified: Secondary | ICD-10-CM | POA: Diagnosis not present

## 2015-01-26 DIAGNOSIS — Z0000 Encounter for general adult medical examination without abnormal findings: Secondary | ICD-10-CM

## 2015-01-26 DIAGNOSIS — E1140 Type 2 diabetes mellitus with diabetic neuropathy, unspecified: Secondary | ICD-10-CM

## 2015-01-26 DIAGNOSIS — E785 Hyperlipidemia, unspecified: Secondary | ICD-10-CM

## 2015-01-26 DIAGNOSIS — I10 Essential (primary) hypertension: Secondary | ICD-10-CM

## 2015-01-26 MED ORDER — HYDROCHLOROTHIAZIDE 25 MG PO TABS: 25.0000 mg | ORAL_TABLET | Freq: Every day | ORAL | Status: DC

## 2015-01-26 NOTE — Progress Notes (Signed)
Pre visit review using our clinic review tool, if applicable. No additional management support is needed unless otherwise documented below in the visit note. 

## 2015-01-26 NOTE — Patient Instructions (Signed)
Please work on an advance directive (living will and power of attorney)- blue handout  Flu shot and prevnar shot today  If you are interested in a shingles/zoster vaccine - call your insurance to check on coverage,( you should not get it within 1 month of other vaccines) , then call us for a prescription  for it to take to a pharmacy that gives the shot , or make a nurse visit to get it here depending on your coverage Add HCTZ 25 mg daily for blood pressure and follow up with me in a month  Stop zetia since it is not affordable  Take fish oil /omega 3 - 1000- 3000 mg daily  Get iron (ferrrous sulfate- take approx 325 mg daily over the counter  We will re check anemia at the next visit Get an eye exam when you can Get back on thyroid medicine daily  Get back on diabetic diet Keep walking

## 2015-01-26 NOTE — Progress Notes (Signed)
Subjective:    Patient ID: Valerie Ball, female    DOB: [birth year], 69 y.o.   MRN: [id number]  HPI Here for annual medicare wellness visit as well as chronic/acute medical problems   I have personally reviewed the Medicare Annual Wellness questionnaire and have noted 1. The patient's medical and social history 2. Their use of alcohol, tobacco or illicit drugs 3. Their current medications and supplements 4. The patient's functional ability including ADL's, fall risks, home safety risks and hearing or visual             impairment. 5. Diet and physical activities 6. Evidence for depression or mood disorders  The patients weight, height, BMI have been recorded in the chart and visual acuity is per eye clinic.  I have made referrals, counseling and provided education to the patient based review of the above and I have provided the pt with a written personalized care plan for preventive services. Reviewed and updated provider list, see scanned forms.  See scanned forms.  Routine anticipatory guidance given to patient.  See health maintenance. Colon cancer screening 1/16 - 5 year re check for polyp  Breast cancer screening- had double mastectomy  Has some discomfort -aching at the surgical sites both sides - more on the L side /also nerve symptoms (numbness/tingling) - post op  Self breast exam-no changes  Flu vaccine- needs today  Tetanus vaccine  Pneumovax 11/15 pneumovax / needs prevnar  Zoster vaccine -she [facility]-will check coverage on it  Hep C - does not think she is high risk / she has also donated blood regularly  Advance directive - does not have one - wants to work on that [facility]  Cognitive function addressed- see scanned forms- and if abnormal then additional documentation follows.  No concerns , notices some small differences (she walks in a room and forgets why)  PMH and SH reviewed  Meds, vitals, and allergies reviewed.   ROS: See  HPI.  Otherwise negative.    bp is high today) No cp or palpitations or headaches or edema  No side effects to medicines  BP Readings from Last 3 Encounters:  01/26/15 164/98  12/08/14 166/94  10/14/14 144/72     On losartan 100 Does not monitor bp  Eats healthy most of the time  Gets in lots of walking daily  Is ok with trying hctz     Wt is stable with bmi of 27  Cholesterol Lab Results  Component Value Date   CHOL 145 01/22/2015   CHOL 147 07/20/2014   CHOL 138 04/16/2014   Lab Results  Component Value Date   HDL 28.80* 01/22/2015   HDL 26.80* 07/20/2014   HDL 26.00* 04/16/2014   No results found for: [facility] Lab Results  Component Value Date   TRIG 232.0* 01/22/2015   TRIG 247.0* 07/20/2014   TRIG 272.0* 04/16/2014   Lab Results  Component Value Date   CHOLHDL 5 01/22/2015   CHOLHDL 5 07/20/2014   CHOLHDL 5 04/16/2014   Lab Results  Component Value Date   LDLDIRECT 96.0 01/22/2015   LDLDIRECT 86.0 07/20/2014   LDLDIRECT 84.0 04/16/2014   taking zetia alone  Intolerant of statins  LDL good/ HDL is low  Last 278$ with last zetia px -cannot afford it   Anemia Lab Results  Component Value Date   WBC 4.8 01/22/2015   HGB 10.7* 01/22/2015   HCT 33.0* 01/22/2015   MCV 76.8*  01/22/2015   PLT 131.0 Repeated and verified X2.* 01/22/2015   hx of borderline platelets  No bleeding since last episode with her stomach (spring) Not taking iron  Hb was 12.7 at last check in sept  No symptoms at all  EGD was not long ago -no GI f/u pending  Is more tired   Hypothyroidism  Pt has no clinical changes No change in energy level/ hair or skin/ edema and no tremor Lab Results  Component Value Date   TSH 4.58* 01/22/2015     Has not been taking her thyroid medicine -this is up from the 2 range   Liver Lab Results  Component Value Date   ALT 29 01/22/2015   AST 46* 01/22/2015   ALKPHOS 77 01/22/2015   BILITOT 0.3 01/22/2015    DM  Lab Results    Component Value Date   HGBA1C 7.3* 01/22/2015  up from 6.9  Is not eating a diabetic diet admittedly  She is eating at home more -can have better control  Has done DM teaching  Not utd on an eye exam- she will schedule that    Patient Active Problem List   Diagnosis Date Noted  . Cough 12/08/2014  . Diverticulitis of colon 10/14/2014  . Gout 07/22/2014  . Hypothyroidism 04/23/2014  . Heme + stool 02/18/2014  . Nausea without vomiting 02/18/2014  . Elevated LFTs 02/18/2014  . Occult blood positive stool 02/03/2014  . Encounter for Medicare annual wellness exam 01/23/2014  . Estrogen deficiency 01/23/2014  . Colon cancer screening 01/23/2014  . Abdominal pain, epigastric 01/20/2014  . Tremor 01/20/2014  . RUQ abdominal pain 01/20/2014  . Dizziness 01/07/2014  . Allergic rhinitis 01/07/2014  . Headache above the eye region 01/07/2014  . Thrombocytopenia, unspecified ([place]) 11/25/2013  . Diabetic neuropathy ([place]) 11/25/2013  . History of breast cancer 06/07/2012  . Lap Nissen October 2013 01/19/2012  . Large type III mixed hiatus hernia  12/06/2011  . Stress reaction 10/25/2011  . Routine general medical examination at a health care facility 07/11/2011  . ADVERSE DRUG REACTION 05/05/2010  . TRANSAMINASES, SERUM, ELEVATED 11/11/2009  . Breast cancer, right breast ([place]) 08/02/2009  . DIVERTICULOSIS-COLON 03/03/2008  . ESOPHAGEAL STRICTURE 03/02/2008  . HIATAL HERNIA 03/02/2008  . PERITONEAL ADHESIONS 03/02/2008  . Anemia 02/07/2008  . Diabetes type 2, controlled ([place]) 08/19/2007  . ANXIETY 04/15/2007  . DISORDERS, ORGANIC SLEEP NEC 08/23/2006  . SYNDROME, RESTLESS LEGS 08/23/2006  . SYNDROME, CARPAL TUNNEL 08/23/2006  . Hyperlipidemia LDL goal <100 08/22/2006  . Essential hypertension 08/22/2006  . ALLERGIC RHINITIS 08/22/2006  . GERD 08/22/2006  . Fatty liver 08/22/2006  . SEIZURE DISORDER 08/22/2006   Past Medical History  Diagnosis Date  . Allergy     allegic  rhinitis  . Anxiety   . GERD (gastroesophageal reflux disease)   . Hypertension   . Hyperlipidemia   . Seizure disorder ([place])   . RLS (restless legs syndrome)   . Arthritis   . Diverticulosis   . GI bleed   . Cataract   . Salmonella 05/2000    renal effects secondary to dehydration  . Hiatal hernia   . Melena 01/2008    anemia transfusion  . Esophageal stricture 02/04/2008  . Borderline diabetic     per patient medical history form  . Menopause     per patient medical history form  . Postmenopausal hormone therapy     per patient medical history form.  . Blood transfusion   .  Osteoporosis   . Colon stricture ([place]) 01/14/2003  . Diabetes mellitus     pt's medical doctor took her off diabetic meds-blood sugars have not been a problem--and medications were causing pt's sugars to be too low  . Seizures ([place])     one seizure several yrs ago --etiology unknown-no problem since  . Anemia   . Breast cancer ([place]) 02/2009    breast CA L invasive ductal CA(hormone receptor positive.)  . Colon polyp     hyperplastic  . Hepatic steatosis    Past Surgical History  Procedure Laterality Date  . Abdominal hysterectomy  1983  . Ovary surgery  1986    ovarian tumor removed  . Mastectomy  04/2009    Bilateral for ductal carcinoma on L  . Breast surgery  02/2009    breast biopsy invasive ductal CA-bilateral mastectomies  . Epigastric hernia repair  01/02/2012    Procedure: HERNIA REPAIR EPIGASTRIC ADULT;  Surgeon: Pedro Earls, MD;  Location: [facility];  Service: General;  Laterality: N/A;  Laparoscopic Repair Paraesophageal Hernia, Nissen  . Laparoscopic nissen fundoplication  [id number]    Procedure: LAPAROSCOPIC NISSEN FUNDOPLICATION;  Surgeon: Pedro Earls, MD;  Location: [facility];  Service: General;  Laterality: N/A;   Social History  Substance Use Topics  . Smoking status: Never Smoker   . Smokeless tobacco: Never Used  . Alcohol Use: No   Family History  Problem  Relation Age of Onset  . Heart attack Father   . Esophageal cancer Father   . Diabetes Father   . Cancer Father     esophageal  . Breast cancer Sister   . Diabetes Sister   . Cancer Sister     breast  . Hypertension Brother   . Diabetes Brother   . Breast cancer Sister   . Cancer Sister     breast  . Hypertension Mother   . Stroke Mother   . Breast cancer Mother   . Cancer Mother     breast  . Hypertension Sister    Allergies  Allergen Reactions  . Arimidex [Anastrozole]     Joint pain  . Glipizide Other (See Comments)    Hypoglycemia   . Metformin And Related     diarrhea  . Penicillins Rash   Current Outpatient Prescriptions on File Prior to Visit  Medication Sig Dispense Refill  . Cholecalciferol (VITAMIN D-3 PO) Take 1,000 mg by mouth daily.     . fluticasone (FLONASE) 50 MCG/ACT nasal spray Place 2 sprays into both nostrils daily as needed. Allergies 16 g 11  . levothyroxine (SYNTHROID, LEVOTHROID) 25 MCG tablet TAKE 1 TABLET BY MOUTH DAILY BEFORE BREAKFAST, 1/2 HOUR BEFORE TAKING OTHER MEDICINES 30 tablet 2  . losartan (COZAAR) 100 MG tablet Take 1 tablet (100 mg total) by mouth daily. 30 tablet 11  . Multiple Vitamin (MULTIVITAMIN WITH MINERALS) TABS tablet Take 1 tablet by mouth daily.    Marland Kitchen omeprazole (PRILOSEC) 20 MG capsule TAKE 1 CAPSULE (20 MG TOTAL) BY MOUTH AT BEDTIME. 90 capsule 2  . rOPINIRole (REQUIP) 1 MG tablet TAKE 1 TABLET (1 MG TOTAL) BY MOUTH AT BEDTIME. 90 tablet 2  . sertraline (ZOLOFT) 100 MG tablet TAKE 1 TABLET (100 MG TOTAL) BY MOUTH AT BEDTIME. 90 tablet 2  . promethazine (PHENERGAN) 25 MG tablet Take 1 tablet (25 mg total) by mouth every 8 (eight) hours as needed for nausea or vomiting. (Patient not taking: Reported on 01/26/2015) 20 tablet 0  . [  DISCONTINUED] cetirizine (ZYRTEC) 10 MG tablet Take 10 mg by mouth daily as needed. For allergy symptoms     No current facility-administered medications on file prior to visit.    Review of  Systems Review of Systems  Constitutional: Negative for fever, appetite change,  and unexpected weight change. pos for fatigue Eyes: Negative for pain and visual disturbance.  Respiratory: Negative for cough and shortness of breath.   Cardiovascular: Negative for cp or palpitations    Gastrointestinal: Negative for nausea, diarrhea and constipation. neg for abd pain or blood in stool/dark stool Genitourinary: Negative for urgency and frequency.  Skin: Negative for pallor or rash  pos for tingling and pain around mastectomy incisions  Neurological: Negative for weakness, light-headedness, numbness and headaches.  Hematological: Negative for adenopathy. Does not bruise/bleed easily.  Psychiatric/Behavioral: Negative for dysphoric mood. The patient is not nervous/anxious.  pos for stressors        Objective:   Physical Exam  Constitutional: She appears well-developed and well-nourished. No distress.  overwt and well app  HENT:  Head: Normocephalic and atraumatic.  Right Ear: External ear normal.  Left Ear: External ear normal.  Mouth/Throat: Oropharynx is clear and moist.  Eyes: Conjunctivae and EOM are normal. Pupils are equal, round, and reactive to light. No scleral icterus.  Neck: Normal range of motion. Neck supple. No JVD present. Carotid bruit is not present. No thyromegaly present.  Cardiovascular: Normal rate, regular rhythm, normal heart sounds and intact distal pulses.  Exam reveals no gallop.   Pulmonary/Chest: Effort normal and breath sounds normal. No respiratory distress. She has no wheezes. She exhibits no tenderness.  Abdominal: Soft. Bowel sounds are normal. She exhibits no distension, no abdominal bruit and no mass. There is no tenderness.  Genitourinary:  Mastectomy incision sites look normal and well healed   Musculoskeletal: Normal range of motion. She exhibits no edema or tenderness.  Lymphadenopathy:    She has no cervical adenopathy.  Neurological: She is  alert. She has normal reflexes. No cranial nerve deficit. She exhibits normal muscle tone. Coordination normal.  Skin: Skin is warm and dry. No rash noted. No erythema. No pallor.  Psychiatric: She has a normal mood and affect.          Assessment & Plan:   Problem List Items Addressed This Visit      Cardiovascular and Mediastinum   Essential hypertension - Primary    bp in poor control at this time  BP Readings from Last 1 Encounters:  01/26/15 164/98  adding hctz 25 mg daily  Disc lifstyle change with low sodium diet and exercise -plans on making some changes        Relevant Medications   hydrochlorothiazide (HYDRODIURIL) 25 MG tablet     Endocrine   Diabetes type 2, controlled ([place])    Worsened control Lab Results  Component Value Date   HGBA1C 7.3* 01/22/2015   Due to poor habits and self care Disc goals of low glycemic diet/exercise and wt control for DM control  Will re check in 3 mo      Hypothyroidism    tsh is elevated because pt has missed a week of medicine  Lab Results  Component Value Date   TSH 4.58* 01/22/2015   She will start back  F/u in 1 mo -will re check         Other   Anemia    This has worsened with Hb of 10.7  Unsure why-has no GI symptoms  or blood in stool  Past hx of GI issues  Will begin ferrous sulfate 325 mg daily  F/u 1 mo and we will re check this  Enc balanced diet       Encounter for Medicare annual wellness exam    Reviewed health habits including diet and exercise and skin cancer prevention Reviewed appropriate screening tests for age  Also reviewed health mt list, fam hx and immunization status , as well as social and family history   See HPI Labs reviewed Please work on an advance directive (living will and power of attorney)- blue handout  Flu shot and prevnar shot today  If you are interested in a shingles/zoster vaccine - call your insurance to check on coverage,( you should not get it within 1 month of other  vaccines) , then call us for a prescription  for it to take to a pharmacy that gives the shot , or make a nurse visit to get it here depending on your coverage Add HCTZ 25 mg daily for blood pressure and follow up with me in a month  Stop zetia since it is not affordable  Take fish oil /omega 3 - 1000- 3000 mg daily  Get iron (ferrrous sulfate- take approx 325 mg daily over the counter  We will re check anemia at the next visit Get an eye exam when you can Get back on thyroid medicine daily  Get back on diabetic diet Keep walking       Hyperlipidemia LDL goal <100    Intol of statins  Disc goals for lipids and reasons to control them Rev labs with pt Rev low sat fat diet in detail Will work on HDL with exercise and omega 3 supplementation  Hope to dec triglycerides with better glucose control  Pt has to stop zetia due to increased price/cannot afford  Will re check at 1 mo f/u       Relevant Medications   hydrochlorothiazide (HYDRODIURIL) 25 MG tablet    Other Visit Diagnoses    Need for influenza vaccination        Relevant Orders    Flu Vaccine QUAD 36+ mos PF IM (Fluarix & Fluzone Quad PF) (Completed)    Need for vaccination with 13-polyvalent pneumococcal conjugate vaccine        Relevant Orders    Pneumococcal conjugate vaccine 13-valent (Completed)

## 2015-01-28 NOTE — Assessment & Plan Note (Signed)
bp in poor control at this time  BP Readings from Last 1 Encounters:  01/26/15 164/98  adding hctz 25 mg daily  Disc lifstyle change with low sodium diet and exercise -plans on making some changes

## 2015-01-28 NOTE — Assessment & Plan Note (Signed)
This has worsened with Hb of 10.7  Unsure why-has no GI symptoms or blood in stool  Past hx of GI issues  Will begin ferrous sulfate 325 mg daily  F/u 1 mo and we will re check this  Enc balanced diet

## 2015-01-28 NOTE — Assessment & Plan Note (Signed)
tsh is elevated because pt has missed a week of medicine  Lab Results  Component Value Date   TSH 4.58* 01/22/2015   She will start back  F/u in 1 mo -will re check

## 2015-01-28 NOTE — Assessment & Plan Note (Signed)
Intol of statins  Disc goals for lipids and reasons to control them Rev labs with pt Rev low sat fat diet in detail Will work on HDL with exercise and omega 3 supplementation  Hope to dec triglycerides with better glucose control  Pt has to stop zetia due to increased price/cannot afford  Will re check at 1 mo f/u

## 2015-01-28 NOTE — Assessment & Plan Note (Signed)
Worsened control Lab Results  Component Value Date   HGBA1C 7.3* 01/22/2015   Due to poor habits and self care Disc goals of low glycemic diet/exercise and wt control for DM control  Will re check in 3 mo

## 2015-01-28 NOTE — Assessment & Plan Note (Signed)
Reviewed health habits including diet and exercise and skin cancer prevention Reviewed appropriate screening tests for age  Also reviewed health mt list, fam hx and immunization status , as well as social and family history   See HPI Labs reviewed Please work on an advance directive (living will and power of attorney)- blue handout  Flu shot and prevnar shot today  If you are interested in a shingles/zoster vaccine - call your insurance to check on coverage,( you should not get it within 1 month of other vaccines) , then call us for a prescription  for it to take to a pharmacy that gives the shot , or make a nurse visit to get it here depending on your coverage Add HCTZ 25 mg daily for blood pressure and follow up with me in a month  Stop zetia since it is not affordable  Take fish oil /omega 3 - 1000- 3000 mg daily  Get iron (ferrrous sulfate- take approx 325 mg daily over the counter  We will re check anemia at the next visit Get an eye exam when you can Get back on thyroid medicine daily  Get back on diabetic diet Keep walking

## 2015-01-29 ENCOUNTER — Other Ambulatory Visit: Payer: Medicare Other

## 2015-02-06 ENCOUNTER — Other Ambulatory Visit: Payer: Self-pay | Admitting: Family Medicine

## 2015-02-08 NOTE — Telephone Encounter (Signed)
done

## 2015-02-08 NOTE — Telephone Encounter (Signed)
Please refill until seen Thanks

## 2015-02-08 NOTE — Telephone Encounter (Signed)
Pt has f/u scheduled on 02/26/15, last refilled on 05/13/14 #90 with 2 additional refills, please advise

## 2015-02-15 ENCOUNTER — Other Ambulatory Visit: Payer: Self-pay | Admitting: Family Medicine

## 2015-02-15 NOTE — Telephone Encounter (Signed)
Please refill for a year  

## 2015-02-15 NOTE — Telephone Encounter (Signed)
Electronic refill request, pt has f/u scheduled on 02/26/15, last refilled on 05/13/14 #90 with 2 additional refills, please advise

## 2015-02-26 ENCOUNTER — Ambulatory Visit (INDEPENDENT_AMBULATORY_CARE_PROVIDER_SITE_OTHER): Payer: Medicare Other | Admitting: Family Medicine

## 2015-02-26 ENCOUNTER — Encounter: Payer: Self-pay | Admitting: Family Medicine

## 2015-02-26 VITALS — BP 140/80 | HR 88 | Temp 98.3°F | Ht 63.5 in | Wt 159.5 lb

## 2015-02-26 DIAGNOSIS — I10 Essential (primary) hypertension: Secondary | ICD-10-CM | POA: Diagnosis not present

## 2015-02-26 DIAGNOSIS — D649 Anemia, unspecified: Secondary | ICD-10-CM | POA: Diagnosis not present

## 2015-02-26 DIAGNOSIS — K589 Irritable bowel syndrome without diarrhea: Secondary | ICD-10-CM

## 2015-02-26 DIAGNOSIS — E039 Hypothyroidism, unspecified: Secondary | ICD-10-CM | POA: Diagnosis not present

## 2015-02-26 LAB — CBC WITH DIFFERENTIAL/PLATELET
BASOS ABS: 0 10*3/uL (ref 0.0–0.1)
Basophils Relative: 0.3 % (ref 0.0–3.0)
EOS PCT: 0.7 % (ref 0.0–5.0)
Eosinophils Absolute: 0 10*3/uL (ref 0.0–0.7)
HCT: 33.1 % — ABNORMAL LOW (ref 36.0–46.0)
Hemoglobin: 10.6 g/dL — ABNORMAL LOW (ref 12.0–15.0)
LYMPHS ABS: 1.2 10*3/uL (ref 0.7–4.0)
LYMPHS PCT: 23.5 % (ref 12.0–46.0)
MCHC: 32.1 g/dL (ref 30.0–36.0)
MCV: 77.1 fl — AB (ref 78.0–100.0)
MONO ABS: 0.4 10*3/uL (ref 0.1–1.0)
MONOS PCT: 7.8 % (ref 3.0–12.0)
NEUTROS ABS: 3.6 10*3/uL (ref 1.4–7.7)
NEUTROS PCT: 67.7 % (ref 43.0–77.0)
PLATELETS: 137 10*3/uL — AB (ref 150.0–400.0)
RBC: 4.29 Mil/uL (ref 3.87–5.11)
RDW: 17.4 % — ABNORMAL HIGH (ref 11.5–15.5)
WBC: 5.3 10*3/uL (ref 4.0–10.5)

## 2015-02-26 LAB — COMPREHENSIVE METABOLIC PANEL
ALT: 37 U/L — AB (ref 0–35)
AST: 61 U/L — ABNORMAL HIGH (ref 0–37)
Albumin: 4.1 g/dL (ref 3.5–5.2)
Alkaline Phosphatase: 88 U/L (ref 39–117)
BILIRUBIN TOTAL: 0.4 mg/dL (ref 0.2–1.2)
BUN: 17 mg/dL (ref 6–23)
CO2: 29 meq/L (ref 19–32)
Calcium: 9.5 mg/dL (ref 8.4–10.5)
Chloride: 100 mEq/L (ref 96–112)
Creatinine, Ser: 0.94 mg/dL (ref 0.40–1.20)
GFR: 63.03 mL/min (ref >60.00)
GLUCOSE: 280 mg/dL — AB (ref 70–99)
POTASSIUM: 3.9 meq/L (ref 3.5–5.1)
SODIUM: 140 meq/L (ref 135–145)
Total Protein: 7.8 g/dL (ref 6.0–8.3)

## 2015-02-26 LAB — TSH: TSH: 2.59 u[IU]/mL (ref 0.35–4.50)

## 2015-02-26 LAB — FERRITIN: Ferritin: 12.7 ng/mL (ref 10.0–291.0)

## 2015-02-26 MED ORDER — ZOSTER VACCINE LIVE 19400 UNT/0.65ML ~~LOC~~ SOLR: 0.6500 mL | Freq: Once | SUBCUTANEOUS | Status: DC

## 2015-02-26 NOTE — Progress Notes (Signed)
Subjective:    Patient ID: Valerie Ball, female    DOB: Jun 02, 1945, 69 y.o.   MRN: [id number]  HPI Here with f/u of chronic medical problems   Wt is up 4 lb with bmi of 27   Still not feeling very well   Still has a hard time with her stomach- still urgency of stools after she eats  Painful at times  colonosc was 1/16 Thinks she eats enough fiber  Does not drink enough fluids (she does not like water)- tries to drink low calorie sweets  Long time since she tried fiber   bp is improved from last time -- does well with added hctz - and tolerates well  No cp or palpitations or headaches or edema  No side effects to medicines  BP Readings from Last 3 Encounters:  02/26/15 146/88  01/26/15 164/98  12/08/14 166/94     Does not watch sodium enough  Uses vinegar a lot   Got back on her thyroid medicine  No change in energy level   Not enough exercise - getting some  Walks her dog   Anemic last visit Lab Results  Component Value Date   WBC 4.8 01/22/2015   HGB 10.7* 01/22/2015   HCT 33.0* 01/22/2015   MCV 76.8* 01/22/2015   PLT 131.0 Repeated and verified X2.* 01/22/2015   mvi with iron daily    Patient Active Problem List   Diagnosis Date Noted  . IBS (irritable bowel syndrome) 02/28/2015  . Cough 12/08/2014  . Diverticulitis of colon 10/14/2014  . Gout 07/22/2014  . Hypothyroidism 04/23/2014  . Heme + stool 02/18/2014  . Nausea without vomiting 02/18/2014  . Elevated LFTs 02/18/2014  . Occult blood positive stool 02/03/2014  . Encounter for Medicare annual wellness exam 01/23/2014  . Estrogen deficiency 01/23/2014  . Colon cancer screening 01/23/2014  . Abdominal pain, epigastric 01/20/2014  . Tremor 01/20/2014  . RUQ abdominal pain 01/20/2014  . Dizziness 01/07/2014  . Allergic rhinitis 01/07/2014  . Headache above the eye region 01/07/2014  . Thrombocytopenia, unspecified ([place]) 11/25/2013  . Diabetic neuropathy ([place]) 11/25/2013  . History of breast  cancer 06/07/2012  . Lap Nissen October 2013 01/19/2012  . Large type III mixed hiatus hernia  12/06/2011  . Stress reaction 10/25/2011  . Routine general medical examination at a health care facility 07/11/2011  . ADVERSE DRUG REACTION 05/05/2010  . TRANSAMINASES, SERUM, ELEVATED 11/11/2009  . Breast cancer, right breast ([place]) 08/02/2009  . DIVERTICULOSIS-COLON 03/03/2008  . ESOPHAGEAL STRICTURE 03/02/2008  . HIATAL HERNIA 03/02/2008  . PERITONEAL ADHESIONS 03/02/2008  . Anemia 02/07/2008  . Diabetes type 2, controlled ([place]) 08/19/2007  . ANXIETY 04/15/2007  . DISORDERS, ORGANIC SLEEP NEC 08/23/2006  . SYNDROME, RESTLESS LEGS 08/23/2006  . SYNDROME, CARPAL TUNNEL 08/23/2006  . Hyperlipidemia LDL goal <100 08/22/2006  . Essential hypertension 08/22/2006  . ALLERGIC RHINITIS 08/22/2006  . GERD 08/22/2006  . Fatty liver 08/22/2006  . SEIZURE DISORDER 08/22/2006   Past Medical History  Diagnosis Date  . Allergy     allegic rhinitis  . Anxiety   . GERD (gastroesophageal reflux disease)   . Hypertension   . Hyperlipidemia   . Seizure disorder ([place])   . RLS (restless legs syndrome)   . Arthritis   . Diverticulosis   . GI bleed   . Cataract   . Salmonella 05/2000    renal effects secondary to dehydration  . Hiatal hernia   . Melena 01/2008  anemia transfusion  . Esophageal stricture 02/04/2008  . Borderline diabetic     per patient medical history form  . Menopause     per patient medical history form  . Postmenopausal hormone therapy     per patient medical history form.  . Blood transfusion   . Osteoporosis   . Colon stricture ([place]) 01/14/2003  . Diabetes mellitus     pt's medical doctor took her off diabetic meds-blood sugars have not been a problem--and medications were causing pt's sugars to be too low  . Seizures ([place])     one seizure several yrs ago --etiology unknown-no problem since  . Anemia   . Breast cancer ([place]) 02/2009    breast CA L invasive  ductal CA(hormone receptor positive.)  . Colon polyp     hyperplastic  . Hepatic steatosis    Past Surgical History  Procedure Laterality Date  . Abdominal hysterectomy  1983  . Ovary surgery  1986    ovarian tumor removed  . Mastectomy  04/2009    Bilateral for ductal carcinoma on L  . Breast surgery  02/2009    breast biopsy invasive ductal CA-bilateral mastectomies  . Epigastric hernia repair  01/02/2012    Procedure: HERNIA REPAIR EPIGASTRIC ADULT;  Surgeon: Pedro Earls, MD;  Location: [facility];  Service: General;  Laterality: N/A;  Laparoscopic Repair Paraesophageal Hernia, Nissen  . Laparoscopic nissen fundoplication  [id number]    Procedure: LAPAROSCOPIC NISSEN FUNDOPLICATION;  Surgeon: Pedro Earls, MD;  Location: [facility];  Service: General;  Laterality: N/A;   Social History  Substance Use Topics  . Smoking status: Never Smoker   . Smokeless tobacco: Never Used  . Alcohol Use: No   Family History  Problem Relation Age of Onset  . Heart attack Father   . Esophageal cancer Father   . Diabetes Father   . Cancer Father     esophageal  . Breast cancer Sister   . Diabetes Sister   . Cancer Sister     breast  . Hypertension Brother   . Diabetes Brother   . Breast cancer Sister   . Cancer Sister     breast  . Hypertension Mother   . Stroke Mother   . Breast cancer Mother   . Cancer Mother     breast  . Hypertension Sister    Allergies  Allergen Reactions  . Arimidex [Anastrozole]     Joint pain  . Glipizide Other (See Comments)    Hypoglycemia   . Metformin And Related     diarrhea  . Penicillins Rash   Current Outpatient Prescriptions on File Prior to Visit  Medication Sig Dispense Refill  . fluticasone (FLONASE) 50 MCG/ACT nasal spray Place 2 sprays into both nostrils daily as needed. Allergies 16 g 11  . hydrochlorothiazide (HYDRODIURIL) 25 MG tablet Take 1 tablet (25 mg total) by mouth daily. 30 tablet 11  . levothyroxine (SYNTHROID,  LEVOTHROID) 25 MCG tablet TAKE 1 TABLET BY MOUTH DAILY BEFORE BREAKFAST, 1/2 HOUR BEFORE TAKING OTHER MEDICINES 30 tablet 2  . losartan (COZAAR) 100 MG tablet Take 1 tablet (100 mg total) by mouth daily. 30 tablet 11  . Multiple Vitamin (MULTIVITAMIN WITH MINERALS) TABS tablet Take 1 tablet by mouth daily.    Marland Kitchen omeprazole (PRILOSEC) 20 MG capsule TAKE 1 CAPSULE (20 MG TOTAL) BY MOUTH AT BEDTIME. 90 capsule 2  . promethazine (PHENERGAN) 25 MG tablet Take 1 tablet (25 mg total) by mouth every 8 (  eight) hours as needed for nausea or vomiting. 20 tablet 0  . rOPINIRole (REQUIP) 1 MG tablet TAKE 1 TABLET BY MOUTH AT BEDTIME 90 tablet 0  . sertraline (ZOLOFT) 100 MG tablet TAKE 1 TABLET (100 MG TOTAL) BY MOUTH AT BEDTIME. 90 tablet 3  . [DISCONTINUED] cetirizine (ZYRTEC) 10 MG tablet Take 10 mg by mouth daily as needed. For allergy symptoms     No current facility-administered medications on file prior to visit.     Review of Systems Review of Systems  Constitutional: Negative for fever, appetite change, and unexpected weight change. pos for generalized malaise  Eyes: Negative for pain and visual disturbance.  Respiratory: Negative for cough and shortness of breath.   Cardiovascular: Negative for cp or palpitations    Gastrointestinal: Negative for nausea, and constipation. pos for bloating and cramping and urgent stools  Genitourinary: Negative for urgency and frequency.  Skin: Negative for pallor or rash   Neurological: Negative for weakness, light-headedness, numbness and headaches.  Hematological: Negative for adenopathy. Does not bruise/bleed easily.  Psychiatric/Behavioral: Negative for dysphoric mood. The patient is not nervous/anxious.         Objective:   Physical Exam  Constitutional: She appears well-developed and well-nourished. No distress.  overwt and well app  HENT:  Head: Normocephalic and atraumatic.  Mouth/Throat: Oropharynx is clear and moist.  Eyes: Conjunctivae and  EOM are normal. Pupils are equal, round, and reactive to light. No scleral icterus.  Neck: Normal range of motion. Neck supple. No JVD present. Carotid bruit is not present. No thyromegaly present.  Cardiovascular: Normal rate, regular rhythm, normal heart sounds and intact distal pulses.  Exam reveals no gallop.   Pulmonary/Chest: Effort normal and breath sounds normal. No respiratory distress. She has no wheezes. She has no rales.  No crackles  Abdominal: Soft. Bowel sounds are normal. She exhibits no distension, no abdominal bruit and no mass. There is tenderness. There is no rebound and no guarding.  Mild diffuse tenderness- worse epigastric and bilat LE No rebound or guarding   Musculoskeletal: She exhibits no edema or tenderness.  Lymphadenopathy:    She has no cervical adenopathy.  Neurological: She is alert. She has normal reflexes.  Skin: Skin is warm and dry. No rash noted. No erythema. No pallor.  Psychiatric: She has a normal mood and affect.          Assessment & Plan:   Problem List Items Addressed This Visit      Cardiovascular and Mediastinum   Essential hypertension - Primary    Improved with addition of HCTZ BP: 140/80 mmHg  Will continue to monitor at home Rev DASH eating plan in detail  Also urged exercise and wt loss  F/u planned  Lab today      Relevant Orders   Comprehensive metabolic panel (Completed)     Digestive   IBS (irritable bowel syndrome)    Pt exp more bloating/cramping and urgent loose stools after eating  Enc trial of fiber and inc fluids Also diet journal  May need GI fu if not improved Rev last colonoscopy Will update if worse symptoms, blood in stool or fever         Endocrine   Hypothyroidism    Pt is back on her levothyroxine  TSH today and titrate if needed She is still fatigued       Relevant Orders   TSH (Completed)     Other   Anemia    Re check on  iron today  Unsure if cause  Pt denies blood in stool or dark  stool but has IBS symptoms  Will likely have to f/u with GI      Relevant Orders   CBC with Differential/Platelet (Completed)   Ferritin (Completed)   Vitamin B12

## 2015-02-26 NOTE — Progress Notes (Signed)
Pre visit review using our clinic review tool, if applicable. No additional management support is needed unless otherwise documented below in the visit note. 

## 2015-02-26 NOTE — Patient Instructions (Addendum)
Lab today for chemistries and blood count and thyroid  For stool problems- get sugar free metamucil and take serving a day with water  Drink more fluids  ? If artificial sweetener is worsening your problem  Try to get in more water   If GI symptoms do not improve please let me know - may need to re visit GI  Get a px for the shingles vaccine to your pharmacy   Get your eye check as planned    You are due for A1C and next follow up in 2 months

## 2015-02-28 DIAGNOSIS — K589 Irritable bowel syndrome without diarrhea: Secondary | ICD-10-CM | POA: Insufficient documentation

## 2015-02-28 NOTE — Assessment & Plan Note (Signed)
Pt exp more bloating/cramping and urgent loose stools after eating  Enc trial of fiber and inc fluids Also diet journal  May need GI fu if not improved Rev last colonoscopy Will update if worse symptoms, blood in stool or fever

## 2015-02-28 NOTE — Assessment & Plan Note (Signed)
Pt is back on her levothyroxine  TSH today and titrate if needed She is still fatigued

## 2015-02-28 NOTE — Assessment & Plan Note (Signed)
Improved with addition of HCTZ BP: 140/80 mmHg  Will continue to monitor at home Rev DASH eating plan in detail  Also urged exercise and wt loss  F/u planned  Lab today

## 2015-02-28 NOTE — Assessment & Plan Note (Signed)
Re check on iron today  Unsure if cause  Pt denies blood in stool or dark stool but has IBS symptoms  Will likely have to f/u with GI

## 2015-03-01 ENCOUNTER — Telehealth: Payer: Self-pay | Admitting: Family Medicine

## 2015-03-01 DIAGNOSIS — R7402 Elevation of levels of lactic acid dehydrogenase (LDH): Secondary | ICD-10-CM

## 2015-03-01 DIAGNOSIS — D649 Anemia, unspecified: Secondary | ICD-10-CM

## 2015-03-01 DIAGNOSIS — K589 Irritable bowel syndrome without diarrhea: Secondary | ICD-10-CM

## 2015-03-01 DIAGNOSIS — R740 Nonspecific elevation of levels of transaminase and lactic acid dehydrogenase [LDH]: Secondary | ICD-10-CM

## 2015-03-01 NOTE — Telephone Encounter (Signed)
Ref to GI for multiple things Will route to [facility]

## 2015-03-01 NOTE — Telephone Encounter (Signed)
-----   Message from Tammi Sou, [place] sent at 03/01/2015 12:48 PM EST ----- Pt did view results on mychart and she is okay with referral to GI doc, I advise pt one of our [facility] will call her to schedule an appt., please put referral in

## 2015-04-01 DIAGNOSIS — Z961 Presence of intraocular lens: Secondary | ICD-10-CM | POA: Diagnosis not present

## 2015-04-01 DIAGNOSIS — E119 Type 2 diabetes mellitus without complications: Secondary | ICD-10-CM | POA: Diagnosis not present

## 2015-04-01 LAB — HM DIABETES EYE EXAM

## 2015-04-02 ENCOUNTER — Encounter: Payer: Self-pay | Admitting: Family Medicine

## 2015-04-06 ENCOUNTER — Other Ambulatory Visit: Payer: Self-pay

## 2015-04-06 ENCOUNTER — Encounter: Payer: Self-pay | Admitting: Internal Medicine

## 2015-04-06 ENCOUNTER — Ambulatory Visit (INDEPENDENT_AMBULATORY_CARE_PROVIDER_SITE_OTHER): Payer: Medicare Other | Admitting: Internal Medicine

## 2015-04-06 ENCOUNTER — Other Ambulatory Visit (INDEPENDENT_AMBULATORY_CARE_PROVIDER_SITE_OTHER): Payer: Medicare Other

## 2015-04-06 VITALS — BP 124/84 | HR 80 | Ht 63.58 in | Wt 160.4 lb

## 2015-04-06 DIAGNOSIS — D508 Other iron deficiency anemias: Secondary | ICD-10-CM

## 2015-04-06 DIAGNOSIS — D509 Iron deficiency anemia, unspecified: Secondary | ICD-10-CM | POA: Diagnosis not present

## 2015-04-06 DIAGNOSIS — R1084 Generalized abdominal pain: Secondary | ICD-10-CM

## 2015-04-06 DIAGNOSIS — R195 Other fecal abnormalities: Secondary | ICD-10-CM

## 2015-04-06 DIAGNOSIS — K219 Gastro-esophageal reflux disease without esophagitis: Secondary | ICD-10-CM

## 2015-04-06 LAB — IBC PANEL
Iron: 71 ug/dL (ref 42–145)
SATURATION RATIOS: 11.9 % — AB (ref 20.0–50.0)
Transferrin: 427 mg/dL — ABNORMAL HIGH (ref 212.0–360.0)

## 2015-04-06 LAB — CBC WITH DIFFERENTIAL/PLATELET
BASOS PCT: 0.3 % (ref 0.0–3.0)
Basophils Absolute: 0 10*3/uL (ref 0.0–0.1)
EOS PCT: 0.9 % (ref 0.0–5.0)
Eosinophils Absolute: 0 10*3/uL (ref 0.0–0.7)
HEMATOCRIT: 36.8 % (ref 36.0–46.0)
HEMOGLOBIN: 11.9 g/dL — AB (ref 12.0–15.0)
Lymphocytes Relative: 25.2 % (ref 12.0–46.0)
Lymphs Abs: 1.3 10*3/uL (ref 0.7–4.0)
MCHC: 32.3 g/dL (ref 30.0–36.0)
MCV: 78.6 fl (ref 78.0–100.0)
MONOS PCT: 9.3 % (ref 3.0–12.0)
Monocytes Absolute: 0.5 10*3/uL (ref 0.1–1.0)
Neutro Abs: 3.3 10*3/uL (ref 1.4–7.7)
Neutrophils Relative %: 64.3 % (ref 43.0–77.0)
Platelets: 131 10*3/uL — ABNORMAL LOW (ref 150.0–400.0)
RBC: 4.69 Mil/uL (ref 3.87–5.11)
RDW: 17.9 % — AB (ref 11.5–15.5)
WBC: 5.2 10*3/uL (ref 4.0–10.5)

## 2015-04-06 LAB — FERRITIN: Ferritin: 15.5 ng/mL (ref 10.0–291.0)

## 2015-04-06 LAB — IGA: IGA: 460 mg/dL — AB (ref 68–378)

## 2015-04-06 MED ORDER — FERROUS SULFATE 325 (65 FE) MG PO TABS: 325.0000 mg | ORAL_TABLET | Freq: Every day | ORAL | Status: DC

## 2015-04-06 NOTE — Progress Notes (Signed)
HISTORY OF PRESENT ILLNESS:  Valerie Ball is a 70 y.o. female with multiple medical problems as listed below. She is sent today by her primary care provider regarding chronic abdominal complaints and anemia. Previous patient of Dr. Verl Blalock. Established with me last year. GI problems include a history of GERD with large hiatal hernia with prior fundoplication 0000000, diverticulosis with stenosis, and chronic abdominal complaints. Previous evaluation for mild elevation of liver tests secondary to fatty liver. January 2016 underwent colonoscopy to evaluate Hemoccult-positive stool and possible cecal polyp on virtual colonoscopy 2013. Upper endoscopy for epigastric pain in addition to the same. Colonoscopy revealed moderate sigmoid diverticulosis with stenosis, angiodysplastic cecum of the ascending colon and cecum, and incidental hyperplastic polyp. Follow-up in 10 years recommended upper endoscopy was normal except for small hiatal hernia. Patient was recently noted to be anemic by her PCP. Hemoglobin 10.7 compared to 12.92 months prior. She is also thrombocytopenic with platelets 137,000 last check in December. Ferritin levels have been low normal with low iron saturation. GI review of systems reveals her same chronic upper abdominal discomfort underneath the ribs, bloating, and alternating bowel habits. No weight loss.. The patient has been on iron with her multivitamin. No change in hemoglobin from November to December. Has not been checked since though she continues on multivitamin with iron. Addition to her previous endoscopy evaluations, she has undergone MR CP with MRI of the abdomen which was unrevealing  REVIEW OF SYSTEMS:  All non-GI ROS negative except for anxiety, arthritis, back pain, sinus and allergy, fatigue, headaches, night sweats, sleeping problems  Past Medical History  Diagnosis Date  . Allergy     allegic rhinitis  . Anxiety   . GERD (gastroesophageal reflux disease)   .  Hypertension   . Hyperlipidemia   . Seizure disorder ([place])   . RLS (restless legs syndrome)   . Arthritis   . Diverticulosis   . GI bleed   . Cataract   . Salmonella 05/2000    renal effects secondary to dehydration  . Hiatal hernia   . Melena 01/2008    anemia transfusion  . Esophageal stricture 02/04/2008  . Borderline diabetic     per patient medical history form  . Menopause     per patient medical history form  . Postmenopausal hormone therapy     per patient medical history form.  . Blood transfusion   . Osteoporosis   . Colon stricture ([place]) 01/14/2003  . Diabetes mellitus     pt's medical doctor took her off diabetic meds-blood sugars have not been a problem--and medications were causing pt's sugars to be too low  . Seizures ([place])     one seizure several yrs ago --etiology unknown-no problem since  . Anemia   . Breast cancer ([place]) 02/2009    breast CA L invasive ductal CA(hormone receptor positive.)  . Colon polyp     hyperplastic  . Hepatic steatosis   . IBS (irritable bowel syndrome)   . Hypothyroidism   . Angiodysplasia of colon     Past Surgical History  Procedure Laterality Date  . Abdominal hysterectomy  1983  . Ovary surgery  1986    ovarian tumor removed  . Mastectomy  04/2009    Bilateral for ductal carcinoma on L  . Breast surgery  02/2009    breast biopsy invasive ductal CA-bilateral mastectomies  . Epigastric hernia repair  01/02/2012    Procedure: HERNIA REPAIR EPIGASTRIC ADULT;  Surgeon: Pedro Earls, MD;  Location: [facility];  Service: General;  Laterality: N/A;  Laparoscopic Repair Paraesophageal Hernia, Nissen  . Laparoscopic nissen fundoplication  [id number]    Procedure: LAPAROSCOPIC NISSEN FUNDOPLICATION;  Surgeon: Pedro Earls, MD;  Location: [facility];  Service: General;  Laterality: N/A;    Social History [name]  reports that she has never smoked. She has never used smokeless tobacco. She reports that she uses illicit  drugs. She reports that she does not drink alcohol.  family history includes Breast cancer in her mother, sister, and sister; Diabetes in her brother, father, and sister; Esophageal cancer in her father; Heart attack in her father; Hypertension in her brother, mother, and sister; Stroke in her mother.  Allergies  Allergen Reactions  . Arimidex [Anastrozole]     Joint pain  . Glipizide Other (See Comments)    Hypoglycemia   . Metformin And Related     diarrhea  . Penicillins Rash       PHYSICAL EXAMINATION: Vital signs: BP 124/84 mmHg  Pulse 80  Ht 5' 3.58" (1.615 m)  Wt 160 lb 6 oz (72.746 kg)  BMI 27.89 kg/m2  LMP 03/20/1981  Constitutional: generally well-appearing, no acute distress Psychiatric: alert and oriented x3, cooperative Eyes: extraocular movements intact, anicteric, conjunctiva pink Mouth: oral pharynx moist, no lesions Neck: supple without thyromegaly Lymph: no lymphadenopathy Cardiovascular: heart regular rate and rhythm, no murmur Lungs: clear to auscultation bilaterally Abdomen: soft, nontender, nondistended, no obvious ascites, no peritoneal signs, normal bowel sounds, no organomegaly Rectal: Omitted Extremities: no clubbing cyanosis or lower extremity edema bilaterally Skin: no lesions on visible extremities Neuro: No focal deficits. Normal deep tendon reflexes. No asterixis.    ASSESSMENT:  #1. Anemia. Possibly iron deficiency. Rule out celiac with GI complaints #2. Right-sided AVMs on recent colonoscopy. May account for iron deficiency anemia #3. Sigmoid diverticulosis #4. GERD with prior fundoplication. Most recent EGD with hiatal hernia but otherwise normal #5. IBS-type symptoms   PLAN:  #1. Check tissue transglutaminase antibody and serum IgA as a screening tool for celiac disease #2. Repeat CBC and iron studies today #3. Continue iron supplementation. Currently iron in a multivitamin. May need additional supplementation #4. Schedule  capsule endoscopy to evaluate anemia and a history of Hemoccult-positive stool.The nature of the procedure, as well as the risks, benefits, and alternatives were carefully and thoroughly reviewed with the patient. Ample time for discussion and questions allowed. The patient understood, was satisfied, and agreed to proceed.  ADDENDUM: Patient contacted with blood count results. CBC shows improved hemoglobin of 11.9. Ferritin normal. Iron saturation low. Low platelets noted. She has been seen by oncology previously who is aware. Recommended iron sulfate 325 mg daily in addition to her daily multivitamin.

## 2015-04-06 NOTE — Patient Instructions (Signed)
Your physician has requested that you go to the basement for lab work before leaving today  You will be scheduled for a capsule endoscopy

## 2015-04-07 LAB — TISSUE TRANSGLUTAMINASE, IGA: TISSUE TRANSGLUTAMINASE AB, IGA: 1 U/mL (ref <4)

## 2015-04-14 ENCOUNTER — Ambulatory Visit (INDEPENDENT_AMBULATORY_CARE_PROVIDER_SITE_OTHER): Payer: Medicare Other | Admitting: Internal Medicine

## 2015-04-14 DIAGNOSIS — R195 Other fecal abnormalities: Secondary | ICD-10-CM

## 2015-04-14 DIAGNOSIS — D509 Iron deficiency anemia, unspecified: Secondary | ICD-10-CM | POA: Diagnosis not present

## 2015-04-14 NOTE — Progress Notes (Signed)
Patient for capsule endoscopy.  She verbalized understanding of all instructions.  Tolerated the procedure well and returned equipment.  Capsule ID [id number] lot # [id number] exp 04/29/2016

## 2015-04-21 ENCOUNTER — Telehealth: Payer: Self-pay | Admitting: Internal Medicine

## 2015-04-21 ENCOUNTER — Other Ambulatory Visit: Payer: Self-pay

## 2015-04-21 ENCOUNTER — Ambulatory Visit (INDEPENDENT_AMBULATORY_CARE_PROVIDER_SITE_OTHER)
Admission: RE | Admit: 2015-04-21 | Discharge: 2015-04-21 | Disposition: A | Discharge status: Home / Self Care | Payer: Medicare Other | Source: Ambulatory Visit | Attending: Internal Medicine | Admitting: Internal Medicine

## 2015-04-21 DIAGNOSIS — Z09 Encounter for follow-up examination after completed treatment for conditions other than malignant neoplasm: Secondary | ICD-10-CM | POA: Diagnosis not present

## 2015-04-21 DIAGNOSIS — T184XXA Foreign body in colon, initial encounter: Secondary | ICD-10-CM

## 2015-04-21 NOTE — Telephone Encounter (Signed)
Pt to come for xray, does not think she passed the capsule.

## 2015-04-22 ENCOUNTER — Telehealth: Payer: Self-pay

## 2015-04-22 NOTE — Telephone Encounter (Signed)
Pt aware.

## 2015-04-22 NOTE — Telephone Encounter (Signed)
-----   Message from Irene Shipper, MD sent at 04/22/2015  3:03 PM EST ----- Regarding: Capsule endoscopy report Please let patient know that her small bowel capsule endoscopy was unremarkable. No small bowel lesions. Her iron deficiency anemia is related to known AVMs in the colon. The treatment is chronic iron therapy. She should take daily iron and have her PCP or hematology doctors monitor blood counts periodically. Thank you. Convert to a phone note for record reference

## 2015-05-18 ENCOUNTER — Encounter: Payer: Self-pay | Admitting: Family Medicine

## 2015-05-18 ENCOUNTER — Ambulatory Visit (INDEPENDENT_AMBULATORY_CARE_PROVIDER_SITE_OTHER): Payer: Medicare Other | Admitting: Family Medicine

## 2015-05-18 VITALS — BP 135/82 | HR 88 | Temp 98.6°F | Ht 63.5 in | Wt 156.5 lb

## 2015-05-18 DIAGNOSIS — R1013 Epigastric pain: Secondary | ICD-10-CM | POA: Diagnosis not present

## 2015-05-18 DIAGNOSIS — R110 Nausea: Secondary | ICD-10-CM | POA: Diagnosis not present

## 2015-05-18 DIAGNOSIS — K5520 Angiodysplasia of colon without hemorrhage: Secondary | ICD-10-CM

## 2015-05-18 DIAGNOSIS — R740 Nonspecific elevation of levels of transaminase and lactic acid dehydrogenase [LDH]: Secondary | ICD-10-CM

## 2015-05-18 DIAGNOSIS — Z9889 Other specified postprocedural states: Secondary | ICD-10-CM | POA: Diagnosis not present

## 2015-05-18 DIAGNOSIS — Q2733 Arteriovenous malformation of digestive system vessel: Secondary | ICD-10-CM

## 2015-05-18 DIAGNOSIS — R7402 Elevation of levels of lactic acid dehydrogenase (LDH): Secondary | ICD-10-CM

## 2015-05-18 MED ORDER — OMEPRAZOLE 20 MG PO CPDR: 20.0000 mg | DELAYED_RELEASE_CAPSULE | Freq: Two times a day (BID) | ORAL | Status: DC

## 2015-05-18 NOTE — Patient Instructions (Signed)
Continue eating a healthy diet  Increase omeprazole to twice daily am and pm  Avoid fatty foods  Stop at check out for referral to GI - we will try [facility]    In meantime if any problems or worsening symptoms let me know

## 2015-05-18 NOTE — Progress Notes (Signed)
Pre visit review using our clinic review tool, if applicable. No additional management support is needed unless otherwise documented below in the visit note. 

## 2015-05-18 NOTE — Progress Notes (Signed)
Subjective:    Patient ID: Valerie Ball, female    DOB: 03/31/1945, 70 y.o.   MRN: [id number]  HPI Here for f/u of multiple GI c/o and iron dev anemia   Has hx of GERD s/p surgery, R sided colonic AVMs, diverticulosis , IBS Has EGD/colonosocpy and capsule endoscopy recently  Also fatty liver with elevated ast/alt  On iron  Lab Results  Component Value Date   WBC 5.2 04/06/2015   HGB 11.9* 04/06/2015   HCT 36.8 04/06/2015   MCV 78.6 04/06/2015   PLT 131.0* 04/06/2015   Lab Results  Component Value Date   FERRITIN 15.5 04/06/2015   Lab Results  Component Value Date   ALT 37* 02/26/2015   AST 61* 02/26/2015   ALKPHOS 88 02/26/2015   BILITOT 0.4 02/26/2015   Current symptoms - epigastric area feels just like it did before her [place] surgery ([facility]) Did not find a reason for it   She does not think that this problem was addressed at all  Thought Dr Henrene Pastor did not answer her questions  On omeprazole  On iron -is hard on her stomach in some ways   Epigastric pain  It rad to her shoulder blades  knawing ache  No heartburn   Is on omeprazole 20 mg daily  Last few days she has been nauseated - takes phenergan when she really needs it  [place]ace[place] [employer] foods make it worse- so she avoids them  Also spicy foods  Avoids anti inflammatories  Caffeine- 1 cup of coffee per day  Alcohol - once every 2-3 months   Surgeon was Dr Hassell Done in the past   Stress level - is no worse or better than usual   Patient Active Problem List   Diagnosis Date Noted  . IBS (irritable bowel syndrome) 02/28/2015  . Cough 12/08/2014  . Diverticulitis of colon 10/14/2014  . Gout 07/22/2014  . Hypothyroidism 04/23/2014  . Heme + stool 02/18/2014  . Nausea without vomiting 02/18/2014  . Elevated LFTs 02/18/2014  . Occult blood positive stool 02/03/2014  . Encounter for Medicare annual wellness exam 01/23/2014  . Estrogen deficiency 01/23/2014  . Colon cancer screening 01/23/2014  . Abdominal  pain, epigastric 01/20/2014  . Tremor 01/20/2014  . RUQ abdominal pain 01/20/2014  . Dizziness 01/07/2014  . Allergic rhinitis 01/07/2014  . Headache above the eye region 01/07/2014  . Thrombocytopenia, unspecified ([place]) 11/25/2013  . Diabetic neuropathy ([place]) 11/25/2013  . History of breast cancer 06/07/2012  . Lap Nissen October 2013 01/19/2012  . Large type III mixed hiatus hernia  12/06/2011  . Stress reaction 10/25/2011  . Routine general medical examination at a health care facility 07/11/2011  . ADVERSE DRUG REACTION 05/05/2010  . TRANSAMINASES, SERUM, ELEVATED 11/11/2009  . Breast cancer, right breast ([place]) 08/02/2009  . DIVERTICULOSIS-COLON 03/03/2008  . ESOPHAGEAL STRICTURE 03/02/2008  . HIATAL HERNIA 03/02/2008  . PERITONEAL ADHESIONS 03/02/2008  . Anemia 02/07/2008  . Diabetes type 2, controlled ([place]) 08/19/2007  . ANXIETY 04/15/2007  . DISORDERS, ORGANIC SLEEP NEC 08/23/2006  . SYNDROME, RESTLESS LEGS 08/23/2006  . SYNDROME, CARPAL TUNNEL 08/23/2006  . Hyperlipidemia LDL goal <100 08/22/2006  . Essential hypertension 08/22/2006  . ALLERGIC RHINITIS 08/22/2006  . GERD 08/22/2006  . Fatty liver 08/22/2006  . SEIZURE DISORDER 08/22/2006   Past Medical History  Diagnosis Date  . Allergy     allegic rhinitis  . Anxiety   . GERD (gastroesophageal reflux disease)   . Hypertension   .  Hyperlipidemia   . Seizure disorder ([place])   . RLS (restless legs syndrome)   . Arthritis   . Diverticulosis   . GI bleed   . Cataract   . Salmonella 05/2000    renal effects secondary to dehydration  . Hiatal hernia   . Melena 01/2008    anemia transfusion  . Esophageal stricture 02/04/2008  . Borderline diabetic     per patient medical history form  . Menopause     per patient medical history form  . Postmenopausal hormone therapy     per patient medical history form.  . Blood transfusion   . Osteoporosis   . Colon stricture ([place]) 01/14/2003  . Diabetes mellitus      pt's medical doctor took her off diabetic meds-blood sugars have not been a problem--and medications were causing pt's sugars to be too low  . Seizures ([place])     one seizure several yrs ago --etiology unknown-no problem since  . Anemia   . Breast cancer ([place]) 02/2009    breast CA L invasive ductal CA(hormone receptor positive.)  . Colon polyp     hyperplastic  . Hepatic steatosis   . IBS (irritable bowel syndrome)   . Hypothyroidism   . Angiodysplasia of colon    Past Surgical History  Procedure Laterality Date  . Abdominal hysterectomy  1983  . [place] surgery  1986    ovarian tumor removed  . Mastectomy  04/2009    Bilateral for ductal carcinoma on L  . Breast surgery  02/2009    breast biopsy invasive ductal CA-bilateral mastectomies  . Epigastric hernia repair  01/02/2012    Procedure: HERNIA REPAIR EPIGASTRIC ADULT;  Surgeon: Pedro Earls, MD;  Location: [facility];  Service: General;  Laterality: N/A;  Laparoscopic Repair Paraesophageal Hernia, Nissen  . Laparoscopic nissen fundoplication  [id number]    Procedure: LAPAROSCOPIC NISSEN FUNDOPLICATION;  Surgeon: Pedro Earls, MD;  Location: [facility];  Service: General;  Laterality: N/A;   Social History  Substance Use Topics  . Smoking status: Never Smoker   . Smokeless tobacco: Never Used  . Alcohol Use: No   Family History  Problem Relation Age of Onset  . Heart attack Father   . Diabetes Father   . Esophageal cancer Father   . Breast cancer Sister   . Diabetes Sister   . Breast cancer Sister     x 2 sisters  . Hypertension Brother   . Diabetes Brother   . Hypertension Mother   . Stroke Mother   . Breast cancer Mother   . Hypertension Sister    Allergies  Allergen Reactions  . Arimidex [Anastrozole]     Joint pain  . Glipizide Other (See Comments)    Hypoglycemia   . Metformin And Related     diarrhea  . Penicillins Rash   Current Outpatient Prescriptions on File Prior to Visit  Medication Sig  Dispense Refill  . ferrous sulfate 325 (65 FE) MG tablet Take 1 tablet (325 mg total) by mouth daily with breakfast. 30 tablet 3  . fluticasone (FLONASE) 50 MCG/ACT nasal spray Place 2 sprays into both nostrils daily as needed. Allergies 16 g 11  . hydrochlorothiazide (HYDRODIURIL) 25 MG tablet Take 1 tablet (25 mg total) by mouth daily. 30 tablet 11  . levothyroxine (SYNTHROID, LEVOTHROID) 25 MCG tablet TAKE 1 TABLET BY MOUTH DAILY BEFORE BREAKFAST, 1/2 HOUR BEFORE TAKING OTHER MEDICINES 30 tablet 2  . losartan (COZAAR) 100 MG tablet  Take 1 tablet (100 mg total) by mouth daily. 30 tablet 11  . Multiple Vitamin (MULTIVITAMIN WITH MINERALS) TABS tablet Take 1 tablet by mouth daily.    . Omega-3 Fatty Acids (FISH OIL PO) Take 1 capsule by mouth daily.    Marland Kitchen omeprazole (PRILOSEC) 20 MG capsule TAKE 1 CAPSULE (20 MG TOTAL) BY MOUTH AT BEDTIME. 90 capsule 2  . promethazine (PHENERGAN) 25 MG tablet Take 1 tablet (25 mg total) by mouth every 8 (eight) hours as needed for nausea or vomiting. 20 tablet 0  . rOPINIRole (REQUIP) 1 MG tablet TAKE 1 TABLET BY MOUTH AT BEDTIME 90 tablet 0  . sertraline (ZOLOFT) 100 MG tablet TAKE 1 TABLET (100 MG TOTAL) BY MOUTH AT BEDTIME. 90 tablet 3  . [DISCONTINUED] cetirizine (ZYRTEC) 10 MG tablet Take 10 mg by mouth daily as needed. For allergy symptoms     No current facility-administered medications on file prior to visit.    Review of Systems Review of Systems  Constitutional: Negative for fever, appetite change, fatigue and unexpected weight change.  Eyes: Negative for pain and visual disturbance.  Respiratory: Negative for cough and shortness of breath.   Cardiovascular: Negative for cp or palpitations    Gastrointestinal: Negative for black stool, pos for occ blood in stool, pos for abd pain and nausea w/o vomiting  Genitourinary: Negative for urgency and frequency.  Skin: Negative for pallor or rash   Neurological: Negative for weakness, light-headedness,  numbness and headaches.  Hematological: Negative for adenopathy. Does not bruise/bleed easily.  Psychiatric/Behavioral: Negative for dysphoric mood. The patient is not nervous/anxious.  pos for stressors        Objective:   Physical Exam  Constitutional: She appears well-developed and well-nourished. No distress.  overwt and well app  HENT:  Head: Normocephalic and atraumatic.  Mouth/Throat: Oropharynx is clear and moist.  Eyes: Conjunctivae and EOM are normal. Pupils are equal, round, and reactive to light. No scleral icterus.  Neck: Normal range of motion. Neck supple.  Cardiovascular: Normal rate, regular rhythm and normal heart sounds.   Pulmonary/Chest: Effort normal and breath sounds normal. No respiratory distress. She has no wheezes. She has no rales.  Abdominal: Soft. Bowel sounds are normal. She exhibits no distension, no pulsatile liver, no abdominal bruit, no ascites, no pulsatile midline mass and no mass. There is no hepatosplenomegaly. There is tenderness in the right upper quadrant and epigastric area. There is rebound. There is no guarding, no CVA tenderness, no tenderness at McBurney's point and negative Murphy's sign.  Musculoskeletal: She exhibits no edema.  Lymphadenopathy:    She has no cervical adenopathy.  Neurological: She is alert.  Skin: Skin is warm and dry. No erythema. No pallor.  No jaundice   Psychiatric: She has a normal mood and affect.          Assessment & Plan:   Problem List Items Addressed This Visit      Cardiovascular and Mediastinum   AVM (arteriovenous malformation) of colon    Rev GI records On iron  Still having IBS symptoms as well as epigastric /GERD symptoms She would like to see GI in tertiary care center  Ref done       Relevant Orders   Ambulatory referral to Gastroenterology     Other   Abdominal pain, epigastric    This is worsening lately in pt with a complex GI hx incl colonic AVMs and hx of Nissen Fundiplication    Will inc her ppi (  omeprazole) to bid  Requests ref to tertiary care center for another GI opinion- ref done to [facility]       Relevant Orders   Ambulatory referral to Gastroenterology   Lap Nissen October 2013 - Primary   Relevant Orders   Ambulatory referral to Gastroenterology   Nausea without vomiting    This seems to correspond to her epigastric discomfort and gerd symptoms (remote hx of nissen fundoplication in the past)  Also hx of colonic AVMs and we rev her latest GI studies today  Also hx of inc LFT/ fatty liver  Complex hx  Ref to [facility] for another GI opinion  Inc omeprazole to bid Disc diet / avoid fats and spice        Relevant Orders   Ambulatory referral to Gastroenterology   TRANSAMINASES, SERUM, ELEVATED    With hx of steatosis  Unsure if rel to current upper GI symptoms GI ref done      Relevant Orders   Ambulatory referral to Gastroenterology

## 2015-05-20 NOTE — Assessment & Plan Note (Signed)
This seems to correspond to her epigastric discomfort and gerd symptoms (remote hx of nissen fundoplication in the past)  Also hx of colonic AVMs and we rev her latest GI studies today  Also hx of inc LFT/ fatty liver  Complex hx  Ref to [facility] for another GI opinion  Inc omeprazole to bid Disc diet / avoid fats and spice

## 2015-05-20 NOTE — Assessment & Plan Note (Signed)
Rev GI records On iron  Still having IBS symptoms as well as epigastric /GERD symptoms She would like to see GI in tertiary care center  Ref done

## 2015-05-20 NOTE — Assessment & Plan Note (Signed)
This is worsening lately in pt with a complex GI hx incl colonic AVMs and hx of Nissen Fundiplication  Will inc her ppi (omeprazole) to bid  Requests ref to tertiary care center for another GI opinion- ref done to [facility]

## 2015-05-20 NOTE — Assessment & Plan Note (Signed)
With hx of steatosis  Unsure if rel to current upper GI symptoms GI ref done

## 2015-05-26 ENCOUNTER — Other Ambulatory Visit: Payer: Self-pay | Admitting: Family Medicine

## 2015-05-26 MED ORDER — ROPINIROLE HCL 1 MG PO TABS: 1.0000 mg | ORAL_TABLET | Freq: Every day | ORAL | Status: DC

## 2015-05-26 NOTE — Telephone Encounter (Signed)
Pt called to ck on status of refill for ropinirole to [facility] [address]. Pt request to be done 05/26/15. I advised pt I had not gotten previous refill request for ropinirole. Pt said they are driving to [facility] and leaving in early AM.

## 2015-05-26 NOTE — Telephone Encounter (Signed)
Will refill electronically  

## 2015-05-26 NOTE — Telephone Encounter (Signed)
Spouse walked in needed to get a rx for ropinirole They are leaving to go on vacation tomorrow morning early Please advise when this has been called in  [facility] [address]  Spouse said they have been trying to get this refilled since 3/3

## 2015-05-26 NOTE — Telephone Encounter (Signed)
Pt notified Rx sent 

## 2015-06-30 ENCOUNTER — Emergency Department (HOSPITAL_COMMUNITY): Payer: Medicare Other

## 2015-06-30 ENCOUNTER — Emergency Department (HOSPITAL_COMMUNITY)
Admission: EM | Admit: 2015-06-30 | Discharge: 2015-07-01 | Disposition: A | Discharge status: Home / Self Care | Payer: Medicare Other | Attending: Emergency Medicine | Admitting: Emergency Medicine

## 2015-06-30 ENCOUNTER — Encounter (HOSPITAL_COMMUNITY): Payer: Self-pay | Admitting: Emergency Medicine

## 2015-06-30 DIAGNOSIS — M1990 Unspecified osteoarthritis, unspecified site: Secondary | ICD-10-CM | POA: Insufficient documentation

## 2015-06-30 DIAGNOSIS — S199XXA Unspecified injury of neck, initial encounter: Secondary | ICD-10-CM | POA: Diagnosis not present

## 2015-06-30 DIAGNOSIS — F419 Anxiety disorder, unspecified: Secondary | ICD-10-CM | POA: Insufficient documentation

## 2015-06-30 DIAGNOSIS — Y9389 Activity, other specified: Secondary | ICD-10-CM | POA: Insufficient documentation

## 2015-06-30 DIAGNOSIS — E039 Hypothyroidism, unspecified: Secondary | ICD-10-CM | POA: Insufficient documentation

## 2015-06-30 DIAGNOSIS — Z79899 Other long term (current) drug therapy: Secondary | ICD-10-CM | POA: Insufficient documentation

## 2015-06-30 DIAGNOSIS — S6992XA Unspecified injury of left wrist, hand and finger(s), initial encounter: Secondary | ICD-10-CM | POA: Diagnosis not present

## 2015-06-30 DIAGNOSIS — S0993XA Unspecified injury of face, initial encounter: Secondary | ICD-10-CM | POA: Diagnosis not present

## 2015-06-30 DIAGNOSIS — Z853 Personal history of malignant neoplasm of breast: Secondary | ICD-10-CM | POA: Diagnosis not present

## 2015-06-30 DIAGNOSIS — Z7951 Long term (current) use of inhaled steroids: Secondary | ICD-10-CM | POA: Insufficient documentation

## 2015-06-30 DIAGNOSIS — S40012A Contusion of left shoulder, initial encounter: Secondary | ICD-10-CM | POA: Insufficient documentation

## 2015-06-30 DIAGNOSIS — E119 Type 2 diabetes mellitus without complications: Secondary | ICD-10-CM | POA: Diagnosis not present

## 2015-06-30 DIAGNOSIS — S299XXA Unspecified injury of thorax, initial encounter: Secondary | ICD-10-CM | POA: Diagnosis not present

## 2015-06-30 DIAGNOSIS — Y998 Other external cause status: Secondary | ICD-10-CM | POA: Diagnosis not present

## 2015-06-30 DIAGNOSIS — D649 Anemia, unspecified: Secondary | ICD-10-CM | POA: Diagnosis not present

## 2015-06-30 DIAGNOSIS — Z880 Allergy status to penicillin: Secondary | ICD-10-CM | POA: Insufficient documentation

## 2015-06-30 DIAGNOSIS — M810 Age-related osteoporosis without current pathological fracture: Secondary | ICD-10-CM | POA: Insufficient documentation

## 2015-06-30 DIAGNOSIS — I10 Essential (primary) hypertension: Secondary | ICD-10-CM | POA: Insufficient documentation

## 2015-06-30 DIAGNOSIS — R42 Dizziness and giddiness: Secondary | ICD-10-CM | POA: Diagnosis not present

## 2015-06-30 DIAGNOSIS — Z86010 Personal history of colonic polyps: Secondary | ICD-10-CM | POA: Diagnosis not present

## 2015-06-30 DIAGNOSIS — Z8619 Personal history of other infectious and parasitic diseases: Secondary | ICD-10-CM | POA: Diagnosis not present

## 2015-06-30 DIAGNOSIS — Y92481 Parking lot as the place of occurrence of the external cause: Secondary | ICD-10-CM | POA: Insufficient documentation

## 2015-06-30 DIAGNOSIS — G2581 Restless legs syndrome: Secondary | ICD-10-CM | POA: Diagnosis not present

## 2015-06-30 DIAGNOSIS — K219 Gastro-esophageal reflux disease without esophagitis: Secondary | ICD-10-CM | POA: Insufficient documentation

## 2015-06-30 DIAGNOSIS — H269 Unspecified cataract: Secondary | ICD-10-CM | POA: Insufficient documentation

## 2015-06-30 DIAGNOSIS — S60222A Contusion of left hand, initial encounter: Secondary | ICD-10-CM | POA: Diagnosis not present

## 2015-06-30 DIAGNOSIS — S0990XA Unspecified injury of head, initial encounter: Secondary | ICD-10-CM | POA: Diagnosis not present

## 2015-06-30 DIAGNOSIS — S3992XA Unspecified injury of lower back, initial encounter: Secondary | ICD-10-CM | POA: Insufficient documentation

## 2015-06-30 DIAGNOSIS — S20219A Contusion of unspecified front wall of thorax, initial encounter: Secondary | ICD-10-CM | POA: Diagnosis not present

## 2015-06-30 DIAGNOSIS — S0012XA Contusion of left eyelid and periocular area, initial encounter: Secondary | ICD-10-CM | POA: Diagnosis not present

## 2015-06-30 DIAGNOSIS — S3991XA Unspecified injury of abdomen, initial encounter: Secondary | ICD-10-CM | POA: Diagnosis not present

## 2015-06-30 LAB — CBC WITH DIFFERENTIAL/PLATELET
Basophils Absolute: 0 10*3/uL (ref 0.0–0.1)
Basophils Relative: 0 %
Eosinophils Absolute: 0.1 10*3/uL (ref 0.0–0.7)
Eosinophils Relative: 1 %
HCT: 37.6 % (ref 36.0–46.0)
Hemoglobin: 12.6 g/dL (ref 12.0–15.0)
Lymphocytes Relative: 19 %
Lymphs Abs: 1.3 10*3/uL (ref 0.7–4.0)
MCH: 27.3 pg (ref 26.0–34.0)
MCHC: 33.5 g/dL (ref 30.0–36.0)
MCV: 81.6 fL (ref 78.0–100.0)
Monocytes Absolute: 0.5 10*3/uL (ref 0.1–1.0)
Monocytes Relative: 7 %
Neutro Abs: 5.1 10*3/uL (ref 1.7–7.7)
Neutrophils Relative %: 73 %
Platelets: 112 10*3/uL — ABNORMAL LOW (ref 150–400)
RBC: 4.61 MIL/uL (ref 3.87–5.11)
RDW: 15.3 % (ref 11.5–15.5)
WBC: 7.1 10*3/uL (ref 4.0–10.5)

## 2015-06-30 LAB — I-STAT CHEM 8, ED
BUN: 17 mg/dL (ref 6–20)
CHLORIDE: 102 mmol/L (ref 101–111)
CREATININE: 0.8 mg/dL (ref 0.44–1.00)
Calcium, Ion: 1.14 mmol/L (ref 1.13–1.30)
GLUCOSE: 159 mg/dL — AB (ref 65–99)
HCT: 42 % (ref 36.0–46.0)
Hemoglobin: 14.3 g/dL (ref 12.0–15.0)
POTASSIUM: 3.5 mmol/L (ref 3.5–5.1)
Sodium: 143 mmol/L (ref 135–145)
TCO2: 25 mmol/L (ref 0–100)

## 2015-06-30 LAB — BASIC METABOLIC PANEL
Anion gap: 14 (ref 5–15)
BUN: 15 mg/dL (ref 6–20)
CO2: 26 mmol/L (ref 22–32)
Calcium: 9.8 mg/dL (ref 8.9–10.3)
Chloride: 102 mmol/L (ref 101–111)
Creatinine, Ser: 0.87 mg/dL (ref 0.44–1.00)
GFR calc Af Amer: >60 mL/min (ref >60)
GFR calc non Af Amer: >60 mL/min (ref >60)
Glucose, Bld: 146 mg/dL — ABNORMAL HIGH (ref 65–99)
Potassium: 4 mmol/L (ref 3.5–5.1)
Sodium: 142 mmol/L (ref 135–145)

## 2015-06-30 LAB — I-STAT TROPONIN, ED: TROPONIN I, POC: 0 ng/mL (ref 0.00–0.08)

## 2015-06-30 LAB — CBG MONITORING, ED: GLUCOSE-CAPILLARY: 130 mg/dL — AB (ref 65–99)

## 2015-06-30 MED ORDER — SODIUM CHLORIDE 0.9 % IV BOLUS (SEPSIS)
1000.0000 mL | Freq: Once | INTRAVENOUS | Status: AC
  Administered 2015-06-30: 1000 mL via INTRAVENOUS

## 2015-06-30 MED ORDER — FENTANYL CITRATE (PF) 100 MCG/2ML IJ SOLN
100.0000 ug | Freq: Once | INTRAMUSCULAR | Status: AC
  Administered 2015-06-30: 100 ug via INTRAVENOUS
  Filled 2015-06-30: qty 2
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
## 2015-06-30 NOTE — ED Notes (Signed)
BS 130

## 2015-07-01 ENCOUNTER — Emergency Department (HOSPITAL_COMMUNITY): Payer: Medicare Other

## 2015-07-01 ENCOUNTER — Telehealth: Payer: Self-pay | Admitting: Family Medicine

## 2015-07-01 DIAGNOSIS — S0012XA Contusion of left eyelid and periocular area, initial encounter: Secondary | ICD-10-CM | POA: Diagnosis not present

## 2015-07-01 DIAGNOSIS — S3992XA Unspecified injury of lower back, initial encounter: Secondary | ICD-10-CM | POA: Diagnosis not present

## 2015-07-01 DIAGNOSIS — S0990XA Unspecified injury of head, initial encounter: Secondary | ICD-10-CM | POA: Diagnosis not present

## 2015-07-01 DIAGNOSIS — S299XXA Unspecified injury of thorax, initial encounter: Secondary | ICD-10-CM | POA: Diagnosis not present

## 2015-07-01 DIAGNOSIS — S3991XA Unspecified injury of abdomen, initial encounter: Secondary | ICD-10-CM | POA: Diagnosis not present

## 2015-07-01 DIAGNOSIS — S0993XA Unspecified injury of face, initial encounter: Secondary | ICD-10-CM | POA: Diagnosis not present

## 2015-07-01 DIAGNOSIS — S199XXA Unspecified injury of neck, initial encounter: Secondary | ICD-10-CM | POA: Diagnosis not present

## 2015-07-01 LAB — MAGNESIUM: Magnesium: 1.7 mg/dL (ref 1.7–2.4)

## 2015-07-01 MED ORDER — FENTANYL CITRATE (PF) 100 MCG/2ML IJ SOLN
50.0000 ug | Freq: Once | INTRAMUSCULAR | Status: AC
  Administered 2015-07-01: 50 ug via INTRAVENOUS
  Filled 2015-07-01: qty 2

## 2015-07-01 MED ORDER — IOPAMIDOL (ISOVUE-300) INJECTION 61%
100.0000 mL | Freq: Once | INTRAVENOUS | Status: AC | PRN
  Administered 2015-07-01: 100 mL via INTRAVENOUS

## 2015-07-01 MED ORDER — TRAMADOL HCL 50 MG PO TABS: 50.0000 mg | ORAL_TABLET | Freq: Two times a day (BID) | ORAL | Status: DC | PRN
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
## 2015-07-01 NOTE — Telephone Encounter (Signed)
This will need a 30 minutes appointment. I am happy to see her as soon as she wishes. We can always put 2 of my 15 minute slots together.
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
## 2015-07-06 NOTE — Telephone Encounter (Signed)
You didn't have any time to put pt in sooner appointment still 4/20

## 2015-07-07 ENCOUNTER — Other Ambulatory Visit: Payer: Self-pay | Admitting: Family Medicine

## 2015-07-08 ENCOUNTER — Ambulatory Visit (INDEPENDENT_AMBULATORY_CARE_PROVIDER_SITE_OTHER): Payer: Medicare Other | Admitting: Primary Care

## 2015-07-08 ENCOUNTER — Encounter: Payer: Self-pay | Admitting: Primary Care

## 2015-07-08 VITALS — BP 126/84 | HR 90 | Temp 97.9°F | Ht 64.0 in | Wt 156.0 lb

## 2015-07-08 DIAGNOSIS — Z09 Encounter for follow-up examination after completed treatment for conditions other than malignant neoplasm: Secondary | ICD-10-CM | POA: Diagnosis not present
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
## 2015-07-08 NOTE — Progress Notes (Signed)
Pre visit review using our clinic review tool, if applicable. No additional management support is needed unless otherwise documented below in the visit note. 

## 2015-08-12 DIAGNOSIS — R198 Other specified symptoms and signs involving the digestive system and abdomen: Secondary | ICD-10-CM | POA: Insufficient documentation

## 2015-08-12 DIAGNOSIS — R634 Abnormal weight loss: Secondary | ICD-10-CM | POA: Diagnosis not present

## 2015-08-12 DIAGNOSIS — R7989 Other specified abnormal findings of blood chemistry: Secondary | ICD-10-CM | POA: Diagnosis not present

## 2015-08-12 HISTORY — DX: Other specified symptoms and signs involving the digestive system and abdomen: R19.8

## 2015-08-14 ENCOUNTER — Telehealth: Payer: Self-pay | Admitting: Nurse Practitioner
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
## 2015-08-23 ENCOUNTER — Ambulatory Visit (HOSPITAL_BASED_OUTPATIENT_CLINIC_OR_DEPARTMENT_OTHER): Payer: Medicare Other | Admitting: Nurse Practitioner

## 2015-08-23 ENCOUNTER — Encounter: Payer: Self-pay | Admitting: Nurse Practitioner

## 2015-08-23 VITALS — BP 146/68 | HR 100 | Temp 99.0°F | Resp 17 | Ht 64.0 in | Wt 156.0 lb

## 2015-08-23 DIAGNOSIS — C50911 Malignant neoplasm of unspecified site of right female breast: Secondary | ICD-10-CM

## 2015-08-23 DIAGNOSIS — Z853 Personal history of malignant neoplasm of breast: Secondary | ICD-10-CM
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
## 2015-08-23 NOTE — Patient Instructions (Signed)
Thank you for coming in today!  As we discussed, please continue to perform your self breast exam and report any changes. If you note any new symptoms (please see below), be sure to notify us ASAP.  We'll have you return in one year's time for your next appointment or sooner if you have any problems. Please be sure to stop by scheduling on your way out to make those appointment(s). Looking forward to working with you in the future!  Let us know if you have any questions!  Symptoms to Watch for and Report to Your Provider  . Change along your mastectomy incisions . New or unusual pain that seems unrelated to an injury and does not go away, including back pain or bone pain . Weight loss without trying/intending . Unexplained bleeding . A rash or allergic reaction, such as swelling, severe itching or wheezing . Chills or fevers . Persistent headaches . Shortness of breath or difficulty breathing . Bloody stools or blood in your urine . Nausea, vomiting, diarrhea, loss of appetite, or trouble swallowing . A cough that does not go away . Abdominal pain . Swelling in your arms or legs . Fractures . Hot flashes or other menopausal symptoms . Any other signs mentioned by your doctor or nurse or any unusual symptoms                 that you just can't explain   NOTE: Just because you have certain symptoms, it doesn't mean the cancer has come back or you have a new cancer. Symptoms can be due to other problems that need to be addressed.  It is important to watch for these symptoms and report them to your provider so you can be medically evaluated for any of these concerns!    Living a Life of Wellness After Cancer:  *Note: Please consult your health care provider before using any medications, supplements, over-the-counter products, or other interventions.  Also, please consult your primary care provider before you begin any lifestyle program (diet, exercise, etc.).  Your safety is our top priority and  we want to make sure you continue to live a long and healthy life!    Healthy Lifestyle Recommendations  As a cancer survivor, it is important develop a lifelong commitment to a healthy lifestyle. A healthy lifestyle can prevent cancer from returning as well as prevent other diseases like heart disease, diabetes and high blood pressure.  These are some things that you can do to have a healthy lifestyle:  Marland Kitchen Maintain a healthy weight.  . Exercise daily per your doctor's orders. . Eat a balanced diet high in fruits, vegetables, bran, and fiber. Limit intake of red meat      and processed foods.  . Limit how much alcohol you consume, if at all. Ali Lowe regular bone mineral density testing for osteoporosis.  . Talk to your doctor about cardiovascular disease or "heart disease" screening. . Stop smoking (if you smoke). . Know your family history. . Be mindful of your emotional, social, and spiritual needs. . Meet regularly with a Primary Care Provider (PCP). Find a PCP if you do not             already have one. . Talk to your doctor about regular cancer screening including screening for colon           cancer, GYN cancers, and skin cancer.

## 2015-08-24 ENCOUNTER — Telehealth: Payer: Self-pay | Admitting: Nurse Practitioner

## 2015-08-24 NOTE — Telephone Encounter (Signed)
s.w. pt husband and advised on June 2018 appt....ok and will advised pt

## 2015-08-29 ENCOUNTER — Other Ambulatory Visit: Payer: Self-pay | Admitting: Family Medicine

## 2015-10-13 ENCOUNTER — Encounter: Payer: Self-pay | Admitting: Family Medicine

## 2015-11-08 ENCOUNTER — Telehealth: Payer: Self-pay | Admitting: Family Medicine

## 2015-11-08 NOTE — Telephone Encounter (Signed)
Pt dropped off paperwork that needs filled out and sent to [facility], fax number is on charge sheet and page 2  Placing in rx tower

## 2015-11-08 NOTE — Telephone Encounter (Signed)
Placed in Dr. Tower's inbox 

## 2015-11-09 NOTE — Telephone Encounter (Signed)
Please schedule f/u for this (30 min office visit) - it is complex and I will need her help thanks

## 2015-11-10 NOTE — Telephone Encounter (Signed)
Spoke with spouse and f/u appt scheduled

## 2015-11-12 ENCOUNTER — Encounter: Payer: Self-pay | Admitting: Family Medicine

## 2015-11-12 ENCOUNTER — Ambulatory Visit (INDEPENDENT_AMBULATORY_CARE_PROVIDER_SITE_OTHER): Payer: Medicare Other | Admitting: Family Medicine

## 2015-11-12 VITALS — BP 132/80 | HR 97 | Temp 98.2°F | Ht 64.0 in | Wt 156.2 lb

## 2015-11-12 DIAGNOSIS — E1140 Type 2 diabetes mellitus with diabetic neuropathy, unspecified: Secondary | ICD-10-CM | POA: Diagnosis not present

## 2015-11-12 DIAGNOSIS — Z23 Encounter for immunization: Secondary | ICD-10-CM | POA: Diagnosis not present

## 2015-11-12 DIAGNOSIS — I10 Essential (primary) hypertension: Secondary | ICD-10-CM

## 2015-11-12 LAB — COMPREHENSIVE METABOLIC PANEL
ALK PHOS: 85 U/L (ref 33–130)
ALT: 25 U/L (ref 6–29)
AST: 35 U/L (ref 10–35)
Albumin: 4.3 g/dL (ref 3.6–5.1)
BUN: 14 mg/dL (ref 7–25)
CHLORIDE: 106 mmol/L (ref 98–110)
CO2: 27 mmol/L (ref 20–31)
CREATININE: 0.77 mg/dL (ref 0.50–0.99)
Calcium: 9.7 mg/dL (ref 8.6–10.4)
GLUCOSE: 132 mg/dL — AB (ref 65–99)
Potassium: 4.4 mmol/L (ref 3.5–5.3)
SODIUM: 143 mmol/L (ref 135–146)
TOTAL PROTEIN: 7.4 g/dL (ref 6.1–8.1)
Total Bilirubin: 0.4 mg/dL (ref 0.2–1.2)
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
## 2015-11-12 NOTE — Patient Instructions (Addendum)
Call your insurance co and find out exactly what brand of monitor/strips and lancets your need  Please let us know so we can get them sent  Then start checking glucose daily at different times  Do eat 3 meals per day- do not skip !  Try to stick to a lower fat and lower glycemic diet for both you and your husband  As long as you eat 3 meals per day (snacks as needed) you need no driving restrictions  Keep protein items like nuts around all the time  Recognize hypoglycemia- if you have more episodes please let me know  Lab today for A1C

## 2015-11-12 NOTE — Progress Notes (Signed)
Pre visit review using our clinic review tool, if applicable. No additional management support is needed unless otherwise documented below in the visit note. 

## 2015-11-13 LAB — HEMOGLOBIN A1C
HEMOGLOBIN A1C: 7.8 % — AB (ref <5.7)
Mean Plasma Glucose: 177 mg/dL
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
## 2015-11-13 NOTE — Assessment & Plan Note (Addendum)
bp in fair control at this time  BP Readings from Last 1 Encounters:  11/12/15 132/80   No changes needed Disc lifstyle change with low sodium diet and exercise  Labs today

## 2015-11-14 ENCOUNTER — Encounter: Payer: Self-pay | Admitting: Family Medicine

## 2015-11-17 ENCOUNTER — Telehealth: Payer: Self-pay

## 2015-11-17 NOTE — Telephone Encounter (Signed)
Mrs Valerie Ball said that the pharmacy did not understand the instructions for diabetic supplies; I spoke with Valerie Ball at [address] and he said instructions are test once daily and as directed. Valerie Ball said no problem with that. Free style lite meter is not covered by ins but cost to pt is $16.00. The free style strips cost to pt for # 100 is $48.00. I explained this to [name] and Mr Valerie Ball said he will call Valerie Ball to fill and he will pick it up. The other option given was to contact ins co and see what is approved by ins co. Not sure they wanted to contact ins. Co.

## 2015-12-24 ENCOUNTER — Encounter: Payer: Self-pay | Admitting: Family Medicine

## 2015-12-24 ENCOUNTER — Ambulatory Visit (INDEPENDENT_AMBULATORY_CARE_PROVIDER_SITE_OTHER): Payer: Medicare Other | Admitting: Family Medicine

## 2015-12-24 VITALS — BP 140/88 | HR 86 | Temp 98.2°F | Ht 64.0 in | Wt 156.5 lb

## 2015-12-24 DIAGNOSIS — E1140 Type 2 diabetes mellitus with diabetic neuropathy, unspecified: Secondary | ICD-10-CM

## 2015-12-24 DIAGNOSIS — E1165 Type 2 diabetes mellitus with hyperglycemia: Secondary | ICD-10-CM

## 2015-12-24 DIAGNOSIS — I10 Essential (primary) hypertension: Secondary | ICD-10-CM | POA: Diagnosis not present

## 2015-12-24 DIAGNOSIS — E1142 Type 2 diabetes mellitus with diabetic polyneuropathy: Secondary | ICD-10-CM

## 2015-12-24 MED ORDER — METFORMIN HCL ER 750 MG PO TB24: 750.0000 mg | ORAL_TABLET | Freq: Every day | ORAL | 11 refills | Status: DC
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
## 2015-12-24 NOTE — Patient Instructions (Signed)
Keep exercising - work up to 5 days per week  Keep watching diet Stop at check out regarding referral to diabetic teaching Start the metformin XR -- if you get diarrhea or any other side effects please let me know  Check glucose if you feel like it is low  Otherwise daily alternate am (fasting) with 2 hours after evening meal  Follow up in 3 months with labs prior

## 2015-12-24 NOTE — Progress Notes (Signed)
Pre visit review using our clinic review tool, if applicable. No additional management support is needed unless otherwise documented below in the visit note. 

## 2015-12-26 NOTE — Assessment & Plan Note (Signed)
Lab Results  Component Value Date   HGBA1C 7.8 (H) 11/12/2015   This is up from 7.3 Long disc regarding diet and exercise habits  Will try metformin XR to see if this does not cause diarrhea (750 mg daily)-update if problems or side effects Hope that her neuropathy will improve with better glucose control  F/u 3 mo with lab prior

## 2015-12-26 NOTE — Assessment & Plan Note (Signed)
bp is elevated BP: 140/88  Pt states she did not sleep well last night and feels off in general  It has been better at home  Will re check at f/u

## 2015-12-26 NOTE — Assessment & Plan Note (Signed)
Will try to improve DM control with metformin XR and see if this helps symptoms  If no imp - consider gabapentin or other tx  Urged to take good care of her feet

## 2015-12-27 DIAGNOSIS — H3562 Retinal hemorrhage, left eye: Secondary | ICD-10-CM | POA: Diagnosis not present

## 2015-12-27 DIAGNOSIS — H35352 Cystoid macular degeneration, left eye: Secondary | ICD-10-CM | POA: Diagnosis not present

## 2015-12-27 DIAGNOSIS — H35371 Puckering of macula, right eye: Secondary | ICD-10-CM | POA: Diagnosis not present

## 2015-12-27 DIAGNOSIS — H348322 Tributary (branch) retinal vein occlusion, left eye, stable: Secondary | ICD-10-CM | POA: Diagnosis not present

## 2015-12-27 DIAGNOSIS — H34832 Tributary (branch) retinal vein occlusion, left eye, with macular edema: Secondary | ICD-10-CM | POA: Diagnosis not present

## 2015-12-31 ENCOUNTER — Encounter: Payer: Self-pay | Admitting: Family Medicine

## 2015-12-31 ENCOUNTER — Other Ambulatory Visit: Payer: Self-pay | Admitting: Family Medicine

## 2016-01-03 ENCOUNTER — Encounter: Payer: Self-pay | Admitting: Family Medicine

## 2016-01-03 DIAGNOSIS — H348392 Tributary (branch) retinal vein occlusion, unspecified eye, stable: Secondary | ICD-10-CM | POA: Insufficient documentation

## 2016-01-03 HISTORY — DX: Tributary (branch) retinal vein occlusion, unspecified eye, stable: H34.8392

## 2016-01-03 NOTE — Telephone Encounter (Signed)
Patient called and said she uses [facility]

## 2016-01-04 ENCOUNTER — Telehealth: Payer: Self-pay | Admitting: Family Medicine

## 2016-01-04 MED ORDER — AMLODIPINE BESYLATE 5 MG PO TABS: 5.0000 mg | ORAL_TABLET | Freq: Every day | ORAL | 5 refills | Status: DC

## 2016-01-04 NOTE — Telephone Encounter (Signed)
Adding amlodipine for HTN  Sent to her pharmacy

## 2016-01-31 DIAGNOSIS — H34832 Tributary (branch) retinal vein occlusion, left eye, with macular edema: Secondary | ICD-10-CM | POA: Diagnosis not present

## 2016-01-31 DIAGNOSIS — H35352 Cystoid macular degeneration, left eye: Secondary | ICD-10-CM | POA: Diagnosis not present

## 2016-02-08 ENCOUNTER — Telehealth: Payer: Self-pay | Admitting: Family Medicine

## 2016-02-08 NOTE — Telephone Encounter (Signed)
Left message with household member for pt to call back and schedule AWV + labs with Katha Cabal and OV 30 with PCP.

## 2016-02-24 ENCOUNTER — Other Ambulatory Visit: Payer: Self-pay

## 2016-02-28 DIAGNOSIS — E118 Type 2 diabetes mellitus with unspecified complications: Secondary | ICD-10-CM | POA: Diagnosis not present

## 2016-03-06 DIAGNOSIS — H34832 Tributary (branch) retinal vein occlusion, left eye, with macular edema: Secondary | ICD-10-CM | POA: Diagnosis not present

## 2016-03-14 ENCOUNTER — Other Ambulatory Visit: Payer: Self-pay | Admitting: Family Medicine

## 2016-03-18 ENCOUNTER — Telehealth: Payer: Self-pay | Admitting: Family Medicine

## 2016-03-18 DIAGNOSIS — E1165 Type 2 diabetes mellitus with hyperglycemia: Secondary | ICD-10-CM

## 2016-03-18 DIAGNOSIS — D649 Anemia, unspecified: Secondary | ICD-10-CM

## 2016-03-18 DIAGNOSIS — E1140 Type 2 diabetes mellitus with diabetic neuropathy, unspecified: Secondary | ICD-10-CM

## 2016-03-18 DIAGNOSIS — E039 Hypothyroidism, unspecified: Secondary | ICD-10-CM

## 2016-03-18 DIAGNOSIS — E785 Hyperlipidemia, unspecified: Secondary | ICD-10-CM

## 2016-03-18 DIAGNOSIS — I10 Essential (primary) hypertension: Secondary | ICD-10-CM

## 2016-03-18 NOTE — Telephone Encounter (Signed)
-----   Message from Marchia Bond sent at 03/14/2016  2:15 PM EST ----- Regarding: 3 mo f/u labs Thurs 1/4, need orders. Thanks:-) Please order  future f/u labs for pt's upcoming lab appt. Thanks Aniceto Boss

## 2016-03-23 ENCOUNTER — Other Ambulatory Visit (INDEPENDENT_AMBULATORY_CARE_PROVIDER_SITE_OTHER): Payer: Medicare Other

## 2016-03-23 DIAGNOSIS — E1165 Type 2 diabetes mellitus with hyperglycemia: Secondary | ICD-10-CM

## 2016-03-23 DIAGNOSIS — I10 Essential (primary) hypertension: Secondary | ICD-10-CM | POA: Diagnosis not present

## 2016-03-23 DIAGNOSIS — E039 Hypothyroidism, unspecified: Secondary | ICD-10-CM

## 2016-03-23 DIAGNOSIS — E1140 Type 2 diabetes mellitus with diabetic neuropathy, unspecified: Secondary | ICD-10-CM | POA: Diagnosis not present

## 2016-03-23 DIAGNOSIS — E785 Hyperlipidemia, unspecified: Secondary | ICD-10-CM

## 2016-03-23 DIAGNOSIS — D649 Anemia, unspecified: Secondary | ICD-10-CM | POA: Diagnosis not present

## 2016-03-23 LAB — COMPREHENSIVE METABOLIC PANEL
ALBUMIN: 4.5 g/dL (ref 3.5–5.2)
ALK PHOS: 81 U/L (ref 39–117)
ALT: 27 U/L (ref 0–35)
AST: 33 U/L (ref 0–37)
BUN: 18 mg/dL (ref 6–23)
CO2: 32 mEq/L (ref 19–32)
Calcium: 9.5 mg/dL (ref 8.4–10.5)
Chloride: 102 mEq/L (ref 96–112)
Creatinine, Ser: 0.85 mg/dL (ref 0.40–1.20)
GFR: 70.57 mL/min (ref >60.00)
Glucose, Bld: 153 mg/dL — ABNORMAL HIGH (ref 70–99)
POTASSIUM: 4.6 meq/L (ref 3.5–5.1)
Sodium: 141 mEq/L (ref 135–145)
TOTAL PROTEIN: 8 g/dL (ref 6.0–8.3)
Total Bilirubin: 0.4 mg/dL (ref 0.2–1.2)

## 2016-03-23 LAB — LIPID PANEL
Cholesterol: 191 mg/dL (ref 0–200)
HDL: 30.7 mg/dL — AB (ref >39.00)
NonHDL: 160.56
Total CHOL/HDL Ratio: 6
Triglycerides: 324 mg/dL — ABNORMAL HIGH (ref 0.0–149.0)
VLDL: 64.8 mg/dL — ABNORMAL HIGH (ref 0.0–40.0)

## 2016-03-23 LAB — CBC WITH DIFFERENTIAL/PLATELET
Basophils Absolute: 0 10*3/uL (ref 0.0–0.1)
Basophils Relative: 0.2 % (ref 0.0–3.0)
EOS PCT: 1.3 % (ref 0.0–5.0)
Eosinophils Absolute: 0.1 10*3/uL (ref 0.0–0.7)
HCT: 37.5 % (ref 36.0–46.0)
Hemoglobin: 12.4 g/dL (ref 12.0–15.0)
Lymphocytes Relative: 29.3 % (ref 12.0–46.0)
Lymphs Abs: 1.7 10*3/uL (ref 0.7–4.0)
MCHC: 33.1 g/dL (ref 30.0–36.0)
MCV: 80.9 fl (ref 78.0–100.0)
Monocytes Absolute: 0.4 10*3/uL (ref 0.1–1.0)
Monocytes Relative: 6.1 % (ref 3.0–12.0)
Neutro Abs: 3.7 10*3/uL (ref 1.4–7.7)
Neutrophils Relative %: 63.1 % (ref 43.0–77.0)
Platelets: 137 10*3/uL — ABNORMAL LOW (ref 150.0–400.0)
RBC: 4.64 Mil/uL (ref 3.87–5.11)
RDW: 15.7 % — ABNORMAL HIGH (ref 11.5–15.5)
WBC: 5.9 10*3/uL (ref 4.0–10.5)

## 2016-03-23 LAB — FERRITIN: Ferritin: 29.7 ng/mL (ref 10.0–291.0)

## 2016-03-23 LAB — TSH: TSH: 2.79 u[IU]/mL (ref 0.35–4.50)

## 2016-03-23 LAB — HEMOGLOBIN A1C: HEMOGLOBIN A1C: 7.5 % — AB (ref 4.6–6.5)

## 2016-03-23 LAB — VITAMIN B12: VITAMIN B 12: 172 pg/mL — AB (ref 211–911)

## 2016-03-23 LAB — LDL CHOLESTEROL, DIRECT: LDL DIRECT: 113 mg/dL

## 2016-03-27 ENCOUNTER — Encounter: Payer: Self-pay | Admitting: Family Medicine

## 2016-03-27 ENCOUNTER — Other Ambulatory Visit: Payer: Self-pay | Admitting: Family Medicine

## 2016-03-27 ENCOUNTER — Ambulatory Visit (INDEPENDENT_AMBULATORY_CARE_PROVIDER_SITE_OTHER): Payer: Medicare Other | Admitting: Family Medicine

## 2016-03-27 VITALS — BP 132/76 | HR 94 | Temp 99.0°F | Ht 64.0 in | Wt 154.0 lb

## 2016-03-27 DIAGNOSIS — E1165 Type 2 diabetes mellitus with hyperglycemia: Secondary | ICD-10-CM

## 2016-03-27 DIAGNOSIS — I10 Essential (primary) hypertension: Secondary | ICD-10-CM | POA: Diagnosis not present

## 2016-03-27 DIAGNOSIS — Z170 Estrogen receptor positive status [ER+]: Secondary | ICD-10-CM

## 2016-03-27 DIAGNOSIS — K148 Other diseases of tongue: Secondary | ICD-10-CM

## 2016-03-27 DIAGNOSIS — E1143 Type 2 diabetes mellitus with diabetic autonomic (poly)neuropathy: Secondary | ICD-10-CM

## 2016-03-27 DIAGNOSIS — R945 Abnormal results of liver function studies: Secondary | ICD-10-CM

## 2016-03-27 DIAGNOSIS — C50911 Malignant neoplasm of unspecified site of right female breast: Secondary | ICD-10-CM

## 2016-03-27 DIAGNOSIS — E039 Hypothyroidism, unspecified: Secondary | ICD-10-CM | POA: Diagnosis not present

## 2016-03-27 DIAGNOSIS — Z87898 Personal history of other specified conditions: Secondary | ICD-10-CM

## 2016-03-27 DIAGNOSIS — E1142 Type 2 diabetes mellitus with diabetic polyneuropathy: Secondary | ICD-10-CM | POA: Diagnosis not present

## 2016-03-27 DIAGNOSIS — E1140 Type 2 diabetes mellitus with diabetic neuropathy, unspecified: Secondary | ICD-10-CM

## 2016-03-27 DIAGNOSIS — K3184 Gastroparesis: Secondary | ICD-10-CM

## 2016-03-27 DIAGNOSIS — E119 Type 2 diabetes mellitus without complications: Secondary | ICD-10-CM | POA: Diagnosis not present

## 2016-03-27 DIAGNOSIS — D696 Thrombocytopenia, unspecified: Secondary | ICD-10-CM

## 2016-03-27 DIAGNOSIS — R7989 Other specified abnormal findings of blood chemistry: Secondary | ICD-10-CM

## 2016-03-27 HISTORY — DX: Type 2 diabetes mellitus with diabetic autonomic (poly)neuropathy: E11.43

## 2016-03-27 MED ORDER — METFORMIN HCL ER 500 MG PO TB24: 1000.0000 mg | ORAL_TABLET | Freq: Every day | ORAL | 11 refills | Status: DC

## 2016-03-27 MED ORDER — HYDROCHLOROTHIAZIDE 25 MG PO TABS: 25.0000 mg | ORAL_TABLET | Freq: Every day | ORAL | 11 refills | Status: DC

## 2016-03-27 NOTE — Progress Notes (Signed)
Subjective:    Patient ID: Valerie Ball, female    DOB: 10-19-1945, 71 y.o.   MRN: [id number]  HPI Here for f/u of chronic health problems   Has a spot on tongue L side- lump 3-4 mo   Not feeling good overall  Is hot  Stomach feels yucky - a little nauseated and a little painful at times  Went to [facility] - did some liver tests  Did not think they could do anything about her symptoms  Liver was stable and thought to be due to fatty liver  Notes were reviewed today   Dx with gerd and ibs Omeprazole works as well as anything No heartburn  ? Wonder about DM gastroparesis   Wt Readings from Last 3 Encounters:  03/27/16 154 lb (69.9 kg)  12/24/15 156 lb 8 oz (71 kg)  11/12/15 156 lb 4 oz (70.9 kg)  wt is down 2 lb  Is trying to eat better  No exercise due to cold [facility]  bmi is 26.4  bp is stable today  Better than last time  Home readings are good as well  - low 130s /80s  No cp or palpitations or headaches or edema  No side effects to medicines  BP Readings from Last 3 Encounters:  03/27/16 132/76  12/24/15 140/88  11/12/15 132/80      Chemistry      Component Value Date/Time   NA 141 03/23/2016 1016   NA 143 05/28/2014 1114   K 4.6 03/23/2016 1016   K 4.1 05/28/2014 1114   CL 102 03/23/2016 1016   CL 103 08/21/2012 1144   CO2 32 03/23/2016 1016   CO2 22 05/28/2014 1114   BUN 18 03/23/2016 1016   BUN 19.6 05/28/2014 1114   CREATININE 0.85 03/23/2016 1016   CREATININE 0.77 11/12/2015 1629   CREATININE 1.0 05/28/2014 1114      Component Value Date/Time   CALCIUM 9.5 03/23/2016 1016   CALCIUM 9.0 05/28/2014 1114   ALKPHOS 81 03/23/2016 1016   ALKPHOS 74 05/28/2014 1114   AST 33 03/23/2016 1016   AST 50 (H) 05/28/2014 1114   ALT 27 03/23/2016 1016   ALT 35 05/28/2014 1114   BILITOT 0.4 03/23/2016 1016   BILITOT 0.51 05/28/2014 1114      Liver tests are normal  Eating better  Hx of fatty liver     Hypothyroidism  Pt has no clinical changes No change in energy level/ hair or skin/ edema and no tremor Lab Results  Component Value Date   TSH 2.79 03/23/2016       DM2 Went to her DM classes  Trying to eat better /healthier and she is more aware of what to choose and what portions are -etc  Am glucose 160s A little lower in afternoons  2-4 times per week- does eat after dinner No longer buying things that are high in refined carbs  Last visit - initiated trial of metformin xr to see if this is more tolerable than short acting metformin that caused diarrhea  Lab Results  Component Value Date   HGBA1C 7.5 (H) 03/23/2016  goal is to get down under 7  This is down from 7.8 Still has some diarrhea -but she gets it whether she is taking metformin or not and it is intermittent  Thinks diarrhea is from IBS instead of metformin  Eye exam 1/17 On ARB for renal protection  Hx of thrombocytopenia Lab Results  Component Value Date   WBC 5.9 03/23/2016   HGB 12.4 03/23/2016   HCT 37.5 03/23/2016   MCV 80.9 03/23/2016   PLT 137.0 (L) 03/23/2016   platelets improved from 112     Cholesterol Lab Results  Component Value Date   CHOL 191 03/23/2016   CHOL 145 01/22/2015   CHOL 147 07/20/2014   Lab Results  Component Value Date   HDL 30.70 (L) 03/23/2016   HDL 28.80 (L) 01/22/2015   HDL 26.80 (L) 07/20/2014   No results found for: [facility] Lab Results  Component Value Date   TRIG 324.0 (H) 03/23/2016   TRIG 232.0 (H) 01/22/2015   TRIG 247.0 (H) 07/20/2014   Lab Results  Component Value Date   CHOLHDL 6 03/23/2016   CHOLHDL 5 01/22/2015   CHOLHDL 5 07/20/2014   Lab Results  Component Value Date   LDLDIRECT 113.0 03/23/2016   LDLDIRECT 96.0 01/22/2015   LDLDIRECT 86.0 07/20/2014   LDL is up  Trig up and HDL is low  Plans to start exercise  Diet was not good over holidays-eating out a lot  More steak    Patient Active Problem List   Diagnosis Date Noted   . Tongue lesion 03/27/2016  . Diabetic gastroparesis ([place]) 03/27/2016  . Retinal vein occlusion, branch 01/03/2016  . AVM (arteriovenous malformation) of colon 05/18/2015  . IBS (irritable bowel syndrome) 02/28/2015  . Cough 12/08/2014  . Diverticulitis of colon 10/14/2014  . Gout 07/22/2014  . Hypothyroidism 04/23/2014  . Heme + stool 02/18/2014  . Nausea without vomiting 02/18/2014  . Elevated LFTs 02/18/2014  . Occult blood positive stool 02/03/2014  . Encounter for Medicare annual wellness exam 01/23/2014  . Estrogen deficiency 01/23/2014  . Colon cancer screening 01/23/2014  . Abdominal pain, epigastric 01/20/2014  . Tremor 01/20/2014  . RUQ abdominal pain 01/20/2014  . Dizziness 01/07/2014  . Allergic rhinitis 01/07/2014  . Headache above the eye region 01/07/2014  . Thrombocytopenia ([place]) 11/25/2013  . Diabetic neuropathy ([place]) 11/25/2013  . History of breast cancer 06/07/2012  . Lap Nissen October 2013 01/19/2012  . Large type III mixed hiatus hernia  12/06/2011  . Stress reaction 10/25/2011  . Routine general medical examination at a health care facility 07/11/2011  . ADVERSE DRUG REACTION 05/05/2010  . TRANSAMINASES, SERUM, ELEVATED 11/11/2009  . Breast cancer, right breast ([place]) 08/02/2009  . DIVERTICULOSIS-COLON 03/03/2008  . ESOPHAGEAL STRICTURE 03/02/2008  . HIATAL HERNIA 03/02/2008  . PERITONEAL ADHESIONS 03/02/2008  . Anemia 02/07/2008  . Poorly controlled type 2 diabetes mellitus with neuropathy ([place]) 08/19/2007  . ANXIETY 04/15/2007  . DISORDERS, ORGANIC SLEEP NEC 08/23/2006  . SYNDROME, RESTLESS LEGS 08/23/2006  . SYNDROME, CARPAL TUNNEL 08/23/2006  . Hyperlipidemia LDL goal <100 08/22/2006  . Essential hypertension 08/22/2006  . ALLERGIC RHINITIS 08/22/2006  . GERD 08/22/2006  . Fatty liver 08/22/2006  . History of seizure 08/22/2006   Past Medical History:  Diagnosis Date  . Allergy    allegic rhinitis  . Anemia   . Angiodysplasia of colon    . Anxiety   . Arthritis   . Blood transfusion   . Borderline diabetic    per patient medical history form  . Breast cancer ([place]) 02/2009   breast CA L invasive ductal CA(hormone receptor positive.)  . Cataract   . Colon polyp    hyperplastic  . Colon stricture 01/14/2003  . Diabetes mellitus    pt's medical doctor took her  off diabetic meds-blood sugars have not been a problem--and medications were causing pt's sugars to be too low  . Diverticulosis   . Esophageal stricture 02/04/2008  . GERD (gastroesophageal reflux disease)   . GI bleed   . Hepatic steatosis   . Hiatal hernia   . Hyperlipidemia   . Hypertension   . Hypothyroidism   . IBS (irritable bowel syndrome)   . Melena 01/2008   anemia transfusion  . Menopause    per patient medical history form  . Osteoporosis   . Postmenopausal hormone therapy    per patient medical history form.  Marland Kitchen RLS (restless legs syndrome)   . Salmonella 05/2000   renal effects secondary to dehydration  . Seizure disorder ([place])   . Seizures ([place])    one seizure several yrs ago --etiology unknown-no problem since   Past Surgical History:  Procedure Laterality Date  . ABDOMINAL HYSTERECTOMY  1983  . BREAST SURGERY  02/2009   breast biopsy invasive ductal CA-bilateral mastectomies  . EPIGASTRIC HERNIA REPAIR  01/02/2012   Procedure: HERNIA REPAIR EPIGASTRIC ADULT;  Surgeon: Pedro Earls, MD;  Location: [facility];  Service: General;  Laterality: N/A;  Laparoscopic Repair Paraesophageal Hernia, Nissen  . LAPAROSCOPIC NISSEN FUNDOPLICATION  [id number]   Procedure: LAPAROSCOPIC NISSEN FUNDOPLICATION;  Surgeon: Pedro Earls, MD;  Location: [facility];  Service: General;  Laterality: N/A;  . MASTECTOMY  04/2009   Bilateral for ductal carcinoma on L  . OVARY SURGERY  1986   ovarian tumor removed   Social History  Substance Use Topics  . Smoking status: Never Smoker  . Smokeless tobacco: Never Used  . Alcohol use No   Family History   Problem Relation Age of Onset  . Heart attack Father   . Diabetes Father   . Esophageal cancer Father   . Breast cancer Sister   . Diabetes Sister   . Breast cancer Sister     x 2 sisters  . Hypertension Brother   . Diabetes Brother   . Hypertension Mother   . Stroke Mother   . Breast cancer Mother   . Hypertension Sister    Allergies  Allergen Reactions  . Arimidex [Anastrozole]     Joint pain  . Glipizide Other (See Comments)    Hypoglycemia   . Metformin And Related     diarrhea  . Penicillins Rash   Current Outpatient Prescriptions on File Prior to Visit  Medication Sig Dispense Refill  . amLODipine (NORVASC) 5 MG tablet Take 1 tablet (5 mg total) by mouth daily. 30 tablet 5  . ferrous sulfate 325 (65 FE) MG tablet Take 1 tablet (325 mg total) by mouth daily with breakfast. 30 tablet 3  . fluticasone (FLONASE) 50 MCG/ACT nasal spray Place 2 sprays into both nostrils daily as needed. Allergies 16 g 11  . levothyroxine (SYNTHROID, LEVOTHROID) 25 MCG tablet TAKE ONE TABLET BY MOUTH ONCE DAILY BEFORE BREAKFAST 30 tablet 0  . losartan (COZAAR) 100 MG tablet TAKE ONE TABLET BY MOUTH ONCE DAILY 90 tablet 1  . Multiple Vitamin (MULTIVITAMIN WITH MINERALS) TABS tablet Take 1 tablet by mouth daily.    Marland Kitchen omeprazole (PRILOSEC) 20 MG capsule Take 1 capsule (20 mg total) by mouth 2 (two) times daily before a meal. (Patient taking differently: Take 20 mg by mouth every evening. ) 180 capsule 3  . rOPINIRole (REQUIP) 1 MG tablet Take 1 tablet (1 mg total) by mouth at bedtime. 90 tablet 3  . [  DISCONTINUED] cetirizine (ZYRTEC) 10 MG tablet Take 10 mg by mouth daily as needed. For allergy symptoms     No current facility-administered medications on file prior to visit.     Review of Systems    Review of Systems  Constitutional: Negative for fever, appetite change,  and unexpected weight change.  ENT pos for lesion on tongue -not painful Eyes: Negative for pain and visual disturbance.   Respiratory: Negative for cough and shortness of breath.   Cardiovascular: Negative for cp or palpitations    Gastrointestinal: Negative for , diarrhea and constipation. pos for intermittent loose stool from IBS, pos for intermittent nausea and epigastric pain  Genitourinary: Negative for urgency and frequency.  Skin: Negative for pallor or rash   Neurological: Negative for weakness, light-headedness, numbness and headaches.  Hematological: Negative for adenopathy. Does not bruise/bleed easily.  Psychiatric/Behavioral: Negative for dysphoric mood. The patient is not nervous/anxious.  pos for decreased motivation     Objective:   Physical Exam  Constitutional: She appears well-developed and well-nourished. No distress.  Well appearing   HENT:  Head: Normocephalic and atraumatic.  Mouth/Throat: Oropharynx is clear and moist.  Small polypoid mucosal lesion on L tongue w/o signs of trauma   Eyes: Conjunctivae and EOM are normal. Pupils are equal, round, and reactive to light.  Neck: Normal range of motion. Neck supple. No JVD present. Carotid bruit is not present. No thyromegaly present.  Cardiovascular: Normal rate, regular rhythm, normal heart sounds and intact distal pulses.  Exam reveals no gallop.   Pulmonary/Chest: Effort normal and breath sounds normal. No respiratory distress. She has no wheezes. She has no rales. She exhibits no tenderness.  No crackles  Abdominal: Soft. Bowel sounds are normal. She exhibits no distension, no abdominal bruit and no mass. There is no hepatosplenomegaly. There is tenderness in the epigastric area. There is no rebound, no guarding and no CVA tenderness.  Mild epigastric tenderness w/o rebound or guarding  Musculoskeletal: She exhibits no edema or tenderness.  Lymphadenopathy:    She has no cervical adenopathy.  Neurological: She is alert. She has normal reflexes.  Skin: Skin is warm and dry. No rash noted.  Psychiatric: She has a normal mood and  affect.          Assessment & Plan:   Problem List Items Addressed This Visit      Cardiovascular and Mediastinum   Essential hypertension - Primary    bp in fair control at this time  BP Readings from Last 1 Encounters:  03/27/16 132/76   No changes needed Disc lifstyle change with low sodium diet and exercise  Improved from last visit and readings are good at home Enc her to start an exercise program      Relevant Medications   hydrochlorothiazide (HYDRODIURIL) 25 MG tablet     Digestive   Diabetic gastroparesis ([place])    I suspect this may play a role in her intermittent nausea and epigastric pain  Rev local and [facility] notes  Disc need for better glucose control  Disc keeping meals small and eating slowly -avoiding foods that set off symptoms Will continue to follow       Relevant Medications   metFORMIN (GLUCOPHAGE-XR) 500 MG 24 hr tablet     Endocrine   Diabetic neuropathy ([facility])    No change in symptoms  Continue to strive for better glucose control with inc metformin xr and also lifestyle       Relevant Medications  metFORMIN (GLUCOPHAGE-XR) 500 MG 24 hr tablet   Hypothyroidism    Hypothyroidism  Pt has no clinical changes No change in energy level/ hair or skin/ edema and no tremor Lab Results  Component Value Date   TSH 2.79 03/23/2016          Poorly controlled type 2 diabetes mellitus with neuropathy ([facility])    Neuropathy and likely gastroparesis   Lab Results  Component Value Date   HGBA1C 7.5 (H) 03/23/2016   slt improvement from 7.8 Tolerates metformin xr  Will inc from 750 to 1000 mg daily  Can inc further if not at goal Long discussion regarding diet/exercise [facility]      Relevant Medications   metFORMIN (GLUCOPHAGE-XR) 500 MG 24 hr tablet     Other   Breast cancer, right breast ([place])    No new developments -followed by oncology S/p bilateral mastectomies  No further treatments      Elevated LFTs     Due to fatty liver  Improved with better eating so far due to diabetic education  LFTs are normal as well      History of seizure    Years ago-one seizure w/o recurrence None since No medications       Thrombocytopenia ([facility])    Continue to watch  Platelet count 137 currently  No bleeding or bruising        Tongue lesion    Polypoid mucosal lesion on L side of tongue w/o trauma Will ref to ENT for eval       Relevant Orders   Ambulatory referral to ENT

## 2016-03-27 NOTE — Assessment & Plan Note (Signed)
bp in fair control at this time  BP Readings from Last 1 Encounters:  03/27/16 132/76   No changes needed Disc lifstyle change with low sodium diet and exercise  Improved from last visit and readings are good at home Enc her to start an exercise program

## 2016-03-27 NOTE — Assessment & Plan Note (Signed)
Polypoid mucosal lesion on L side of tongue w/o trauma Will ref to ENT for eval

## 2016-03-27 NOTE — Assessment & Plan Note (Signed)
I suspect this may play a role in her intermittent nausea and epigastric pain  Rev local and [facility] notes  Disc need for better glucose control  Disc keeping meals small and eating slowly -avoiding foods that set off symptoms Will continue to follow

## 2016-03-27 NOTE — Assessment & Plan Note (Signed)
Due to fatty liver  Improved with better eating so far due to diabetic education  LFTs are normal as well

## 2016-03-27 NOTE — Assessment & Plan Note (Signed)
Hypothyroidism  Pt has no clinical changes No change in energy level/ hair or skin/ edema and no tremor Lab Results  Component Value Date   TSH 2.79 03/23/2016

## 2016-03-27 NOTE — Progress Notes (Signed)
Pre visit review using our clinic review tool, if applicable. No additional management support is needed unless otherwise documented below in the visit note. 

## 2016-03-27 NOTE — Assessment & Plan Note (Signed)
Years ago-one seizure w/o recurrence None since No medications

## 2016-03-27 NOTE — Assessment & Plan Note (Signed)
No new developments -followed by oncology S/p bilateral mastectomies  No further treatments

## 2016-03-27 NOTE — Patient Instructions (Addendum)
You need 5 days of exercise per week for diabetes  [facility]  If that is not do-able - get some videos and work out in front of the TV  Try to eat your last meal later and avoid eating after dinner    Cholesterol is up  Exercise will help good number  Low fat diet will help the bad number Better diabetes control will help the triglycerides   Stop at check out for referral to ENT   Increase your metformin XR to 1000 mg once daily  Update me if any side effects or problems    Go ahead and start 81 mg aspirin daily with food-if it cause GI upset then stop it   We will see you in March

## 2016-03-27 NOTE — Assessment & Plan Note (Signed)
Neuropathy and likely gastroparesis   Lab Results  Component Value Date   HGBA1C 7.5 (H) 03/23/2016   slt improvement from 7.8 Tolerates metformin xr  Will inc from 750 to 1000 mg daily  Can inc further if not at goal Long discussion regarding diet/exercise -[facility]

## 2016-03-27 NOTE — Assessment & Plan Note (Signed)
Continue to watch  Platelet count 137 currently  No bleeding or bruising

## 2016-03-27 NOTE — Assessment & Plan Note (Signed)
No change in symptoms  Continue to strive for better glucose control with inc metformin xr and also lifestyle

## 2016-04-11 DIAGNOSIS — D3702 Neoplasm of uncertain behavior of tongue: Secondary | ICD-10-CM | POA: Diagnosis not present

## 2016-04-11 DIAGNOSIS — D101 Benign neoplasm of tongue: Secondary | ICD-10-CM | POA: Insufficient documentation

## 2016-04-18 DIAGNOSIS — H34832 Tributary (branch) retinal vein occlusion, left eye, with macular edema: Secondary | ICD-10-CM | POA: Diagnosis not present

## 2016-04-25 ENCOUNTER — Other Ambulatory Visit: Payer: Self-pay | Admitting: Family Medicine

## 2016-05-24 ENCOUNTER — Other Ambulatory Visit: Payer: Self-pay | Admitting: Family Medicine

## 2016-05-25 NOTE — Telephone Encounter (Signed)
Will refill electronically  

## 2016-05-25 NOTE — Telephone Encounter (Signed)
Pt has CPE scheduled on 06/09/16, last filled on 05/26/15 #90 tabs with 3 additional refills, please advise

## 2016-06-01 ENCOUNTER — Other Ambulatory Visit: Payer: Medicare Other

## 2016-06-05 ENCOUNTER — Ambulatory Visit: Payer: Medicare Other

## 2016-06-05 ENCOUNTER — Telehealth: Payer: Self-pay | Admitting: Family Medicine

## 2016-06-05 DIAGNOSIS — E039 Hypothyroidism, unspecified: Secondary | ICD-10-CM

## 2016-06-05 DIAGNOSIS — E1140 Type 2 diabetes mellitus with diabetic neuropathy, unspecified: Secondary | ICD-10-CM

## 2016-06-05 DIAGNOSIS — D696 Thrombocytopenia, unspecified: Secondary | ICD-10-CM

## 2016-06-05 DIAGNOSIS — E785 Hyperlipidemia, unspecified: Secondary | ICD-10-CM

## 2016-06-05 DIAGNOSIS — M1A0790 Idiopathic chronic gout, unspecified ankle and foot, without tophus (tophi): Secondary | ICD-10-CM

## 2016-06-05 DIAGNOSIS — E1165 Type 2 diabetes mellitus with hyperglycemia: Secondary | ICD-10-CM

## 2016-06-05 DIAGNOSIS — I10 Essential (primary) hypertension: Secondary | ICD-10-CM

## 2016-06-05 NOTE — Telephone Encounter (Signed)
-----   Message from Ellamae Sia sent at 06/05/2016  2:50 PM EDT ----- Regarding: Lab orders for Tuesday, 3.20.18 Patient is scheduled for CPX labs, please order future labs, Thanks , Karna Christmas

## 2016-06-06 ENCOUNTER — Other Ambulatory Visit (INDEPENDENT_AMBULATORY_CARE_PROVIDER_SITE_OTHER): Payer: Medicare Other

## 2016-06-06 DIAGNOSIS — E785 Hyperlipidemia, unspecified: Secondary | ICD-10-CM

## 2016-06-06 DIAGNOSIS — H35371 Puckering of macula, right eye: Secondary | ICD-10-CM | POA: Diagnosis not present

## 2016-06-06 DIAGNOSIS — D696 Thrombocytopenia, unspecified: Secondary | ICD-10-CM | POA: Diagnosis not present

## 2016-06-06 DIAGNOSIS — R7989 Other specified abnormal findings of blood chemistry: Secondary | ICD-10-CM | POA: Diagnosis not present

## 2016-06-06 DIAGNOSIS — E1140 Type 2 diabetes mellitus with diabetic neuropathy, unspecified: Secondary | ICD-10-CM | POA: Diagnosis not present

## 2016-06-06 DIAGNOSIS — I10 Essential (primary) hypertension: Secondary | ICD-10-CM | POA: Diagnosis not present

## 2016-06-06 DIAGNOSIS — E039 Hypothyroidism, unspecified: Secondary | ICD-10-CM

## 2016-06-06 DIAGNOSIS — M1A0790 Idiopathic chronic gout, unspecified ankle and foot, without tophus (tophi): Secondary | ICD-10-CM

## 2016-06-06 DIAGNOSIS — E1165 Type 2 diabetes mellitus with hyperglycemia: Secondary | ICD-10-CM | POA: Diagnosis not present

## 2016-06-06 DIAGNOSIS — H34832 Tributary (branch) retinal vein occlusion, left eye, with macular edema: Secondary | ICD-10-CM | POA: Diagnosis not present

## 2016-06-06 LAB — COMPREHENSIVE METABOLIC PANEL
ALT: 31 U/L (ref 0–35)
AST: 39 U/L — ABNORMAL HIGH (ref 0–37)
Albumin: 4.6 g/dL (ref 3.5–5.2)
Alkaline Phosphatase: 85 U/L (ref 39–117)
BUN: 17 mg/dL (ref 6–23)
CALCIUM: 10.1 mg/dL (ref 8.4–10.5)
CHLORIDE: 100 meq/L (ref 96–112)
CO2: 31 meq/L (ref 19–32)
CREATININE: 0.87 mg/dL (ref 0.40–1.20)
GFR: 68.66 mL/min (ref >60.00)
GLUCOSE: 180 mg/dL — AB (ref 70–99)
Potassium: 4 mEq/L (ref 3.5–5.1)
Sodium: 141 mEq/L (ref 135–145)
Total Bilirubin: 0.4 mg/dL (ref 0.2–1.2)
Total Protein: 8.2 g/dL (ref 6.0–8.3)

## 2016-06-06 LAB — HM DIABETES EYE EXAM

## 2016-06-06 LAB — LIPID PANEL
Cholesterol: 192 mg/dL (ref 0–200)
HDL: 32.4 mg/dL — ABNORMAL LOW (ref >39.00)
NONHDL: 159.41
Total CHOL/HDL Ratio: 6
Triglycerides: 386 mg/dL — ABNORMAL HIGH (ref 0.0–149.0)
VLDL: 77.2 mg/dL — ABNORMAL HIGH (ref 0.0–40.0)

## 2016-06-06 LAB — CBC WITH DIFFERENTIAL/PLATELET
BASOS PCT: 0.3 % (ref 0.0–3.0)
Basophils Absolute: 0 10*3/uL (ref 0.0–0.1)
EOS ABS: 0.1 10*3/uL (ref 0.0–0.7)
Eosinophils Relative: 1.3 % (ref 0.0–5.0)
HCT: 38.3 % (ref 36.0–46.0)
Hemoglobin: 12.7 g/dL (ref 12.0–15.0)
LYMPHS ABS: 2.1 10*3/uL (ref 0.7–4.0)
LYMPHS PCT: 30.8 % (ref 12.0–46.0)
MCHC: 33.2 g/dL (ref 30.0–36.0)
MCV: 82.2 fl (ref 78.0–100.0)
MONOS PCT: 7.8 % (ref 3.0–12.0)
Monocytes Absolute: 0.5 10*3/uL (ref 0.1–1.0)
NEUTROS ABS: 4 10*3/uL (ref 1.4–7.7)
Neutrophils Relative %: 59.8 % (ref 43.0–77.0)
PLATELETS: 127 10*3/uL — AB (ref 150.0–400.0)
RBC: 4.66 Mil/uL (ref 3.87–5.11)
RDW: 15.9 % — AB (ref 11.5–15.5)
WBC: 6.8 10*3/uL (ref 4.0–10.5)

## 2016-06-06 LAB — HEMOGLOBIN A1C: Hgb A1c MFr Bld: 7.7 % — ABNORMAL HIGH (ref 4.6–6.5)

## 2016-06-06 LAB — LDL CHOLESTEROL, DIRECT: LDL DIRECT: 108 mg/dL

## 2016-06-06 LAB — URIC ACID: URIC ACID, SERUM: 7.3 mg/dL — AB (ref 2.4–7.0)

## 2016-06-06 LAB — TSH: TSH: 6.43 u[IU]/mL — ABNORMAL HIGH (ref 0.35–4.50)

## 2016-06-09 ENCOUNTER — Encounter: Payer: Self-pay | Admitting: Family Medicine

## 2016-06-09 ENCOUNTER — Ambulatory Visit (INDEPENDENT_AMBULATORY_CARE_PROVIDER_SITE_OTHER): Payer: Medicare Other | Admitting: Family Medicine

## 2016-06-09 VITALS — BP 118/70 | HR 99 | Temp 98.5°F | Ht 63.5 in | Wt 155.5 lb

## 2016-06-09 DIAGNOSIS — C50911 Malignant neoplasm of unspecified site of right female breast: Secondary | ICD-10-CM | POA: Diagnosis not present

## 2016-06-09 DIAGNOSIS — E039 Hypothyroidism, unspecified: Secondary | ICD-10-CM

## 2016-06-09 DIAGNOSIS — K3184 Gastroparesis: Secondary | ICD-10-CM

## 2016-06-09 DIAGNOSIS — E1143 Type 2 diabetes mellitus with diabetic autonomic (poly)neuropathy: Secondary | ICD-10-CM | POA: Diagnosis not present

## 2016-06-09 DIAGNOSIS — E1142 Type 2 diabetes mellitus with diabetic polyneuropathy: Secondary | ICD-10-CM

## 2016-06-09 DIAGNOSIS — Z853 Personal history of malignant neoplasm of breast: Secondary | ICD-10-CM

## 2016-06-09 DIAGNOSIS — K582 Mixed irritable bowel syndrome: Secondary | ICD-10-CM

## 2016-06-09 DIAGNOSIS — E1140 Type 2 diabetes mellitus with diabetic neuropathy, unspecified: Secondary | ICD-10-CM | POA: Diagnosis not present

## 2016-06-09 DIAGNOSIS — K760 Fatty (change of) liver, not elsewhere classified: Secondary | ICD-10-CM | POA: Diagnosis not present

## 2016-06-09 DIAGNOSIS — I10 Essential (primary) hypertension: Secondary | ICD-10-CM | POA: Diagnosis not present

## 2016-06-09 DIAGNOSIS — E1165 Type 2 diabetes mellitus with hyperglycemia: Secondary | ICD-10-CM

## 2016-06-09 DIAGNOSIS — Z170 Estrogen receptor positive status [ER+]: Secondary | ICD-10-CM

## 2016-06-09 DIAGNOSIS — D696 Thrombocytopenia, unspecified: Secondary | ICD-10-CM | POA: Diagnosis not present

## 2016-06-09 DIAGNOSIS — E785 Hyperlipidemia, unspecified: Secondary | ICD-10-CM

## 2016-06-09 MED ORDER — EZETIMIBE 10 MG PO TABS: 10.0000 mg | ORAL_TABLET | Freq: Every day | ORAL | 11 refills | Status: DC

## 2016-06-09 MED ORDER — FLUTICASONE PROPIONATE 50 MCG/ACT NA SUSP: 2.0000 | Freq: Every day | NASAL | 3 refills | Status: DC | PRN

## 2016-06-09 MED ORDER — METFORMIN HCL ER 500 MG PO TB24: 1500.0000 mg | ORAL_TABLET | Freq: Every day | ORAL | 11 refills | Status: DC

## 2016-06-09 NOTE — Progress Notes (Signed)
Subjective:    Patient ID: Valerie Ball, female    DOB: 1946/02/23, 71 y.o.   MRN: [id number]  HPI Here for annual f/u of chronic medical problems   Feeling ok overall   Wt Readings from Last 3 Encounters:  06/09/16 155 lb 8 oz (70.5 kg)  03/27/16 154 lb (69.9 kg)  12/24/15 156 lb 8 oz (71 kg)  wt is stable  Diet is fair - she "cheats" -cannot figure out why she does not stick with it  She does not prioritize health/diet or exercise  Lots of GI problems regardless of what she eats (rewards herself with food)  Exercise is "not good"-not a lot she can do without discomfort (OA) and also chronic fatigue bmi 27.11  Hep C screen -not interested /not high risk   Eye exam -last was Tuesday - goes frequently for retinal vein occlusion  Still has reduced vision in the eye   Mammogram- no need since bilat mastectomy Self breast exam - still has pain where the scars are but no lumps   Pap/gyn care : pap 04 Has had a hysterectomy  No gyn symptoms at all   Zoster vaccine -has not had /is interested Flu vaccine utd Tetanus vaccine utd Done with pna vaccines   Colonoscopy/ screening : 1/16 hyperplastic polyp - thinks she had a 10 year recall  Has hx of AVM of colon IBS is disabling at times    dexa 12/15 with nl bmd  No falls or fractures recently    bp is stable today  No cp or palpitations or headaches or edema  No side effects to medicines  BP Readings from Last 3 Encounters:  06/09/16 118/70  03/27/16 132/76  12/24/15 140/88      Hx of fatty liver Lab Results  Component Value Date   ALT 31 06/06/2016   AST 39 (H) 06/06/2016   ALKPHOS 85 06/06/2016   BILITOT 0.4 06/06/2016   uric acid is high at 7.3 Does not drink enough water (trying to cut out sodas) Gout- may have flared in knees - got better very quickly - (swollen but not red)  She is mindful about gout causing foods - tries to avoid them     Hx of DM2 with neuropathy and gastroparesis Lab Results    Component Value Date   HGBA1C 7.7 (H) 06/06/2016   This is up from 7.5  Last time inc her metformin xr to 1000 mg once daily Does not think loose stools have changed  She continues to have gastroparesis symptoms w/o vomiting   Hypothyroidism  Pt has no clinical changes No change in energy level/ hair or skin/ edema and no tremor Lab Results  Component Value Date   TSH 6.43 (H) 06/06/2016    She missed that day's dose -this is why it may be high    Hx of thrombocytopenia Lab Results  Component Value Date   WBC 6.8 06/06/2016   HGB 12.7 06/06/2016   HCT 38.3 06/06/2016   MCV 82.2 06/06/2016   PLT 127.0 (L) 06/06/2016  down from 137 No easy bleeding or bruising    Hx of hyperlipidemia Lab Results  Component Value Date   CHOL 192 06/06/2016   CHOL 191 03/23/2016   CHOL 145 01/22/2015   Lab Results  Component Value Date   HDL 32.40 (L) 06/06/2016   HDL 30.70 (L) 03/23/2016   HDL 28.80 (L) 01/22/2015   No results found for: [facility] Lab Results  Component  Value Date   TRIG 386.0 (H) 06/06/2016   TRIG 324.0 (H) 03/23/2016   TRIG 232.0 (H) 01/22/2015   Lab Results  Component Value Date   CHOLHDL 6 06/06/2016   CHOLHDL 6 03/23/2016   CHOLHDL 5 01/22/2015   Lab Results  Component Value Date   LDLDIRECT 108.0 06/06/2016   LDLDIRECT 113.0 03/23/2016   LDLDIRECT 96.0 01/22/2015  myalgia with zocor - intolerable  She stopped zetia due to ins coverage -would like to try px a [facility] -would like to try it   Patient Active Problem List   Diagnosis Date Noted  . Diabetic gastroparesis ([place]) 03/27/2016  . Retinal vein occlusion, branch 01/03/2016  . AVM (arteriovenous malformation) of colon 05/18/2015  . IBS (irritable bowel syndrome) 02/28/2015  . Cough 12/08/2014  . Diverticulitis of colon 10/14/2014  . Gout 07/22/2014  . Hypothyroidism 04/23/2014  . Heme + stool 02/18/2014  . Nausea without vomiting 02/18/2014  . Elevated LFTs 02/18/2014  . Occult blood  positive stool 02/03/2014  . Encounter for Medicare annual wellness exam 01/23/2014  . Estrogen deficiency 01/23/2014  . Colon cancer screening 01/23/2014  . Abdominal pain, epigastric 01/20/2014  . Tremor 01/20/2014  . RUQ abdominal pain 01/20/2014  . Dizziness 01/07/2014  . Allergic rhinitis 01/07/2014  . Headache above the eye region 01/07/2014  . Thrombocytopenia ([place]) 11/25/2013  . Diabetic neuropathy ([place]) 11/25/2013  . History of breast cancer 06/07/2012  . Lap Nissen October 2013 01/19/2012  . Large type III mixed hiatus hernia  12/06/2011  . Stress reaction 10/25/2011  . Routine general medical examination at a health care facility 07/11/2011  . ADVERSE DRUG REACTION 05/05/2010  . TRANSAMINASES, SERUM, ELEVATED 11/11/2009  . Breast cancer, right breast ([place]) 08/02/2009  . DIVERTICULOSIS-COLON 03/03/2008  . ESOPHAGEAL STRICTURE 03/02/2008  . HIATAL HERNIA 03/02/2008  . PERITONEAL ADHESIONS 03/02/2008  . Anemia 02/07/2008  . Poorly controlled type 2 diabetes mellitus with neuropathy ([place]) 08/19/2007  . ANXIETY 04/15/2007  . DISORDERS, ORGANIC SLEEP NEC 08/23/2006  . SYNDROME, RESTLESS LEGS 08/23/2006  . SYNDROME, CARPAL TUNNEL 08/23/2006  . Hyperlipidemia LDL goal <100 08/22/2006  . Essential hypertension 08/22/2006  . ALLERGIC RHINITIS 08/22/2006  . GERD 08/22/2006  . Fatty liver 08/22/2006  . History of seizure 08/22/2006   Past Medical History:  Diagnosis Date  . Allergy    allegic rhinitis  . Anemia   . Angiodysplasia of colon   . Anxiety   . Arthritis   . Blood transfusion   . Borderline diabetic    per patient medical history form  . Breast cancer ([place]) 02/2009   breast CA L invasive ductal CA(hormone receptor positive.)  . Cataract   . Colon polyp    hyperplastic  . Colon stricture 01/14/2003  . Diabetes mellitus    pt's medical doctor took her off diabetic meds-blood sugars have not been a problem--and medications were causing pt's sugars to be  too low  . Diverticulosis   . Esophageal stricture 02/04/2008  . GERD (gastroesophageal reflux disease)   . GI bleed   . Hepatic steatosis   . Hiatal hernia   . Hyperlipidemia   . Hypertension   . Hypothyroidism   . IBS (irritable bowel syndrome)   . Melena 01/2008   anemia transfusion  . Menopause    per patient medical history form  . Osteoporosis   . Postmenopausal hormone therapy    per patient medical history form.  Marland Kitchen RLS (restless legs syndrome)   . Salmonella  05/2000   renal effects secondary to dehydration  . Seizure disorder ([place])   . Seizures ([place])    one seizure several yrs ago --etiology unknown-no problem since   Past Surgical History:  Procedure Laterality Date  . ABDOMINAL HYSTERECTOMY  1983  . BREAST SURGERY  02/2009   breast biopsy invasive ductal CA-bilateral mastectomies  . EPIGASTRIC HERNIA REPAIR  01/02/2012   Procedure: HERNIA REPAIR EPIGASTRIC ADULT;  Surgeon: Pedro Earls, MD;  Location: [facility];  Service: General;  Laterality: N/A;  Laparoscopic Repair Paraesophageal Hernia, Nissen  . LAPAROSCOPIC NISSEN FUNDOPLICATION  78/29/5621   Procedure: LAPAROSCOPIC NISSEN FUNDOPLICATION;  Surgeon: Pedro Earls, MD;  Location: [facility];  Service: General;  Laterality: N/A;  . MASTECTOMY  04/2009   Bilateral for ductal carcinoma on L  . OVARY SURGERY  1986   ovarian tumor removed   Social History  Substance Use Topics  . Smoking status: Never Smoker  . Smokeless tobacco: Never Used  . Alcohol use No   Family History  Problem Relation Age of Onset  . Heart attack Father   . Diabetes Father   . Esophageal cancer Father   . Breast cancer Sister   . Diabetes Sister   . Breast cancer Sister     x 2 sisters  . Hypertension Brother   . Diabetes Brother   . Hypertension Mother   . Stroke Mother   . Breast cancer Mother   . Hypertension Sister    Allergies  Allergen Reactions  . Arimidex [Anastrozole]     Joint pain  . Glipizide Other (See  Comments)    Hypoglycemia   . Metformin And Related     diarrhea  . Penicillins Rash   Current Outpatient Prescriptions on File Prior to Visit  Medication Sig Dispense Refill  . amLODipine (NORVASC) 5 MG tablet Take 1 tablet (5 mg total) by mouth daily. 30 tablet 5  . aspirin EC 81 MG tablet Take 81 mg by mouth daily.    . ferrous sulfate 325 (65 FE) MG tablet Take 1 tablet (325 mg total) by mouth daily with breakfast. 30 tablet 3  . hydrochlorothiazide (HYDRODIURIL) 25 MG tablet Take 1 tablet (25 mg total) by mouth daily. 30 tablet 11  . levothyroxine (SYNTHROID, LEVOTHROID) 25 MCG tablet TAKE ONE TABLET BY MOUTH ONCE DAILY BEFORE BREAKFAST 30 tablet 1  . losartan (COZAAR) 100 MG tablet TAKE ONE TABLET BY MOUTH ONCE DAILY 90 tablet 1  . Multiple Vitamin (MULTIVITAMIN WITH MINERALS) TABS tablet Take 1 tablet by mouth daily.    Marland Kitchen omeprazole (PRILOSEC) 20 MG capsule Take 1 capsule (20 mg total) by mouth 2 (two) times daily before a meal. (Patient taking differently: Take 20 mg by mouth every evening. ) 180 capsule 3  . rOPINIRole (REQUIP) 1 MG tablet TAKE ONE TABLET BY MOUTH AT BEDTIME 90 tablet 3  . sertraline (ZOLOFT) 100 MG tablet TAKE ONE TABLET BY MOUTH AT BEDTIME 90 tablet 1  . [DISCONTINUED] cetirizine (ZYRTEC) 10 MG tablet Take 10 mg by mouth daily as needed. For allergy symptoms     No current facility-administered medications on file prior to visit.      Review of Systems    Review of Systems  Constitutional: Negative for fever, appetite change,  and unexpected weight change.  Eyes: Negative for pain and visual disturbance.  Respiratory: Negative for cough and shortness of breath.   Cardiovascular: Negative for cp or palpitations    Gastrointestinal: Negative for  nausea, diarrhea and constipation.  Genitourinary: Negative for urgency and frequency.  Skin: Negative for pallor or rash   Neurological: Negative for weakness, light-headedness, numbness and headaches.    Hematological: Negative for adenopathy. Does not bruise/bleed easily.  Psychiatric/Behavioral: Negative for dysphoric mood. The patient is not nervous/anxious.  pos for stressors and frustration over her husband's habits     Objective:   Physical Exam  Constitutional: She appears well-developed and well-nourished. No distress.  overwt and well app  HENT:  Head: Normocephalic and atraumatic.  Right Ear: External ear normal.  Left Ear: External ear normal.  Nose: Nose normal.  Mouth/Throat: Oropharynx is clear and moist.  Eyes: Conjunctivae and EOM are normal. Pupils are equal, round, and reactive to light. Right eye exhibits no discharge. Left eye exhibits no discharge. No scleral icterus.  Neck: Normal range of motion. Neck supple. No JVD present. Carotid bruit is not present. No thyromegaly present.  Cardiovascular: Normal rate, regular rhythm, normal heart sounds and intact distal pulses.  Exam reveals no gallop.   Pulmonary/Chest: Effort normal and breath sounds normal. No respiratory distress. She has no wheezes. She has no rales.  Abdominal: Soft. Bowel sounds are normal. She exhibits no distension and no mass. There is no tenderness.  Genitourinary:  Genitourinary Comments: s/p bilat double mastectomy No scar tenderness No lumps in axillae  Musculoskeletal: She exhibits no edema or tenderness.  Lymphadenopathy:    She has no cervical adenopathy.  Neurological: She is alert. She has normal reflexes. No cranial nerve deficit. She exhibits normal muscle tone. Coordination normal.  Skin: Skin is warm and dry. No rash noted. No erythema. No pallor.  Some lentigines / skin tags  Psychiatric: She has a normal mood and affect.          Assessment & Plan:   Problem List Items Addressed This Visit      Cardiovascular and Mediastinum   Essential hypertension - Primary    bp in fair control at this time  BP Readings from Last 1 Encounters:  06/09/16 118/70   No changes  needed Disc lifstyle change with low sodium diet and exercise  Labs reviewed  Wt loss and lifestyle change encouraged       Relevant Medications   ezetimibe (ZETIA) 10 MG tablet     Digestive   Diabetic gastroparesis ([place])    This is ongoing- affecting diet  emph imp of better glucose control and food choices      Relevant Medications   metFORMIN (GLUCOPHAGE-XR) 500 MG 24 hr tablet   Fatty liver    AST is mildly elevated at 39/no other changes  Re emph imp of avoidance of acetaminophen/etoh and other liver toxins  Disc imp of low fat diet and wt loss       IBS (irritable bowel syndrome)    Continues to chronically bother her - affecting her diet         Endocrine   Diabetic neuropathy ([facility])    No change  Will continue to work on better dm control      Relevant Medications   metFORMIN (GLUCOPHAGE-XR) 500 MG 24 hr tablet   Hypothyroidism    Lab Results  Component Value Date   TSH 6.43 (H) 06/06/2016   This is up due to missed dose (s) Disc imp of compliance  Will re check next time No clinical changes      Poorly controlled type 2 diabetes mellitus with neuropathy ([place])    Lab Results  Component Value Date   HGBA1C 7.7 (H) 06/06/2016   This is up/ likely due to poor diet and lack of exercise  She is knowledge re: DM diet but a hard time initiating lifestyle change  utd eye and foot care Suffers from neuropathy and gastroparesis She tolerates metformin xr so will inc dose to 1500 qd f/u planned 3 mo      Relevant Medications   metFORMIN (GLUCOPHAGE-XR) 500 MG 24 hr tablet     Other   Breast cancer, right breast ([place])    Doing well s/p double mastectomy      History of breast cancer    Still doing well s/p double mastectomy      Hyperlipidemia LDL goal <100    She cannot tolerate a statin thus far  Did improve with zetia = then could not afford it  Now asks for px again- new ins may cover Disc goals for lipids and reasons to control them Rev  labs with pt Rev low sat fat diet in detail       Relevant Medications   ezetimibe (ZETIA) 10 MG tablet   Thrombocytopenia (HCC)    Lab Results  Component Value Date   PLT 127.0 (L) 06/06/2016   Down from 137 Continue to follow  No symptoms

## 2016-06-09 NOTE — Progress Notes (Signed)
Pre visit review using our clinic review tool, if applicable. No additional management support is needed unless otherwise documented below in the visit note. 

## 2016-06-09 NOTE — Patient Instructions (Addendum)
Look into water exercise at one of the aquatic centers or a Y with a pool  If you are interested in a shingles/zoster vaccine - call your insurance to check on coverage,( you should not get it within 1 month of other vaccines) , then call us for a prescription  for it to take to a pharmacy that gives the shot , or make a nurse visit to get it here depending on your coverage  For IBS - give citrucel a try with lots of fluid to see if this helps   Try to get 1200-1500 mg of calcium per day with at least 1000 iu of vitamin D - for bone health (this may also help your IBS)  Aim for 64 oz of fluids per day (mostly water)   Get back on zetia if you can afford it -I will send it to [facility]  Increase metformin xr to 3 pills each am (1500 mg)  If any problems let me know   Do the best you can with diet and exercise

## 2016-06-11 NOTE — Assessment & Plan Note (Signed)
AST is mildly elevated at 39/no other changes  Re emph imp of avoidance of acetaminophen/etoh and other liver toxins  Disc imp of low fat diet and wt loss

## 2016-06-11 NOTE — Assessment & Plan Note (Signed)
She cannot tolerate a statin thus far  Did improve with zetia = then could not afford it  Now asks for px again- new ins may cover Disc goals for lipids and reasons to control them Rev labs with pt Rev low sat fat diet in detail

## 2016-06-11 NOTE — Assessment & Plan Note (Signed)
Doing well s/p double mastectomy

## 2016-06-11 NOTE — Assessment & Plan Note (Signed)
Continues to chronically bother her - affecting her diet

## 2016-06-11 NOTE — Assessment & Plan Note (Signed)
No change  Will continue to work on better dm control

## 2016-06-11 NOTE — Assessment & Plan Note (Signed)
Lab Results  Component Value Date   HGBA1C 7.7 (H) 06/06/2016   This is up/ likely due to poor diet and lack of exercise  She is knowledge re: DM diet but a hard time initiating lifestyle change  utd eye and foot care Suffers from neuropathy and gastroparesis She tolerates metformin xr so will inc dose to 1500 qd f/u planned 3 mo

## 2016-06-11 NOTE — Assessment & Plan Note (Signed)
Still doing well s/p double mastectomy

## 2016-06-11 NOTE — Assessment & Plan Note (Signed)
Lab Results  Component Value Date   PLT 127.0 (L) 06/06/2016   Down from 137 Continue to follow  No symptoms

## 2016-06-11 NOTE — Assessment & Plan Note (Signed)
This is ongoing- affecting diet  emph imp of better glucose control and food choices

## 2016-06-11 NOTE — Assessment & Plan Note (Signed)
bp in fair control at this time  BP Readings from Last 1 Encounters:  06/09/16 118/70   No changes needed Disc lifstyle change with low sodium diet and exercise  Labs reviewed  Wt loss and lifestyle change encouraged

## 2016-06-11 NOTE — Assessment & Plan Note (Signed)
Lab Results  Component Value Date   TSH 6.43 (H) 06/06/2016   This is up due to missed dose (s) Disc imp of compliance  Will re check next time No clinical changes

## 2016-06-27 ENCOUNTER — Other Ambulatory Visit: Payer: Self-pay | Admitting: Family Medicine

## 2016-07-04 ENCOUNTER — Other Ambulatory Visit: Payer: Self-pay | Admitting: Family Medicine

## 2016-07-25 ENCOUNTER — Telehealth: Payer: Self-pay | Admitting: Family Medicine

## 2016-07-25 DIAGNOSIS — H35352 Cystoid macular degeneration, left eye: Secondary | ICD-10-CM | POA: Diagnosis not present

## 2016-07-25 DIAGNOSIS — H34832 Tributary (branch) retinal vein occlusion, left eye, with macular edema: Secondary | ICD-10-CM | POA: Diagnosis not present

## 2016-07-25 NOTE — Telephone Encounter (Signed)
Left pt message asking to call Ebony Hail back directly at [phone] to schedule AWV.+ labs with Katha Cabal.  Note*Cpe was 06/09/16

## 2016-08-21 NOTE — Telephone Encounter (Signed)
Scheduled 08/22/16

## 2016-08-22 ENCOUNTER — Ambulatory Visit (INDEPENDENT_AMBULATORY_CARE_PROVIDER_SITE_OTHER): Payer: Medicare Other

## 2016-08-22 ENCOUNTER — Telehealth: Payer: Self-pay

## 2016-08-22 VITALS — BP 130/84 | HR 94 | Temp 98.7°F | Ht 63.5 in | Wt 156.8 lb

## 2016-08-22 DIAGNOSIS — Z0000 Encounter for general adult medical examination without abnormal findings: Secondary | ICD-10-CM | POA: Diagnosis not present

## 2016-08-22 NOTE — Progress Notes (Signed)
PCP notes:   Health maintenance:  No gaps identified.  Abnormal screenings:   None  Patient concerns:   Pt reports concerns with increased blood in stools and diarrhea. PCP notified.  Nurse concerns:  None  Next PCP appt:   Appt to be scheduled.   I reviewed health advisor's note, was available for consultation, and agree with documentation and plan. Loura Pardon MD

## 2016-08-22 NOTE — Telephone Encounter (Signed)
Patient reports increase in diarrhea and blood in stools. Patient states blood is dripping from rectum.

## 2016-08-22 NOTE — Patient Instructions (Signed)
Valerie Ball , Thank you for taking time to come for your Medicare Wellness Visit. I appreciate your ongoing commitment to your health goals. Please review the following plan we discussed and let me know if I can assist you in the future.   These are the goals we discussed: Goals    . Weight (lb) < 135 lb (61.2 kg)          When schedule permits, I will join [employer] and begin exercising at local gym.        This is a list of the screening recommended for you and due dates:  Health Maintenance  Topic Date Due  . Mammogram  07/18/2020*  .  Hepatitis C: One time screening is recommended by Center for Disease Control  (CDC) for  adults born from [birth year] through 1963.   04/22/2023*  . Flu Shot  10/18/2016  . Hemoglobin A1C  12/07/2016  . Eye exam for diabetics  06/06/2017  . Complete foot exam   06/09/2017  . Tetanus Vaccine  07/18/2021  . Colon Cancer Screening  03/24/2024  . DEXA scan (bone density measurement)  Completed  . Pneumonia vaccines  Completed  *Topic was postponed. The date shown is not the original due date.   Preventive Care for Adults  A healthy lifestyle and preventive care can promote health and wellness. Preventive health guidelines for adults include the following key practices.  . A routine yearly physical is a good way to check with your health care provider about your health and preventive screening. It is a chance to share any concerns and updates on your health and to receive a thorough exam.  . Visit your dentist for a routine exam and preventive care every 6 months. Brush your teeth twice a day and floss once a day. Good oral hygiene prevents tooth decay and gum disease.  . The frequency of eye exams is based on your age, health, family medical history, use  of contact lenses, and other factors. Follow your health care provider's ecommendations for frequency of eye exams.  . Eat a healthy diet. Foods like vegetables, fruits, whole grains, low-fat dairy  products, and lean protein foods contain the nutrients you need without too many calories. Decrease your intake of foods high in solid fats, added sugars, and salt. Eat the right amount of calories for you. Get information about a proper diet from your health care provider, if necessary.  . Regular physical exercise is one of the most important things you can do for your health. Most adults should get at least 150 minutes of moderate-intensity exercise (any activity that increases your heart rate and causes you to sweat) each week. In addition, most adults need muscle-strengthening exercises on 2 or more days a week.  Silver Sneakers may be a benefit available to you. To determine eligibility, you may visit the website: www.silversneakers.com or contact program at (414)478-0879 Mon-Fri between 8AM-8PM.   . Maintain a healthy weight. The body mass index (BMI) is a screening tool to identify possible weight problems. It provides an estimate of body fat based on height and weight. Your health care provider can find your BMI and can help you achieve or maintain a healthy weight.   For adults 20 years and older: ? A BMI below 18.5 is considered underweight. ? A BMI of 18.5 to 24.9 is normal. ? A BMI of 25 to 29.9 is considered overweight. ? A BMI of 30 and above is considered obese.   Marland Kitchen  Maintain normal blood lipids and cholesterol levels by exercising and minimizing your intake of saturated fat. Eat a balanced diet with plenty of fruit and vegetables. Blood tests for lipids and cholesterol should begin at age 80 and be repeated every 5 years. If your lipid or cholesterol levels are high, you are over 50, or you are at high risk for heart disease, you may need your cholesterol levels checked more frequently. Ongoing high lipid and cholesterol levels should be treated with medicines if diet and exercise are not working.  . If you smoke, find out from your health care provider how to quit. If you do not  use tobacco, please do not start.  . If you choose to drink alcohol, please do not consume more than 2 drinks per day. One drink is considered to be 12 ounces (355 mL) of beer, 5 ounces (148 mL) of wine, or 1.5 ounces (44 mL) of liquor.  . If you are 71-48 years old, ask your health care provider if you should take aspirin to prevent strokes.  . Use sunscreen. Apply sunscreen liberally and repeatedly throughout the day. You should seek shade when your shadow is shorter than you. Protect yourself by wearing long sleeves, pants, a wide-brimmed hat, and sunglasses year round, whenever you are outdoors.  . Once a month, do a whole body skin exam, using a mirror to look at the skin on your back. Tell your health care provider of new moles, moles that have irregular borders, moles that are larger than a pencil eraser, or moles that have changed in shape or color.

## 2016-08-22 NOTE — Telephone Encounter (Signed)
Thanks for letting me know  Last time we inc her metformin xr from 1000 to 1500 mg daily  Are her glucose levels improved?   Is her diarrhea worse at 1500 than the 1000?   Is this more blood than she has had in the past?  We will likely need to set her up with GI   (hx of ibs and diverticulosis) Any abd pain?   Thanks

## 2016-08-22 NOTE — Progress Notes (Signed)
Subjective:   Valerie Ball is a 71 y.o. female who presents for Medicare Annual (Subsequent) preventive examination.  Review of Systems:  N/A Cardiac Risk Factors include: advanced age (>50men, >77 women)     Objective:     Vitals: BP 130/84 (BP Location: Right Arm, Patient Position: Sitting, Cuff Size: Normal)   Pulse 94   Temp 98.7 F (37.1 C) (Oral)   Ht 5' 3.5" (1.613 m) Comment: no shoes  Wt 156 lb 12 oz (71.1 kg)   LMP 03/20/1981   SpO2 96%   BMI 27.33 kg/m   Body mass index is 27.33 kg/m.   Tobacco History  Smoking Status  . Never Smoker  Smokeless Tobacco  . Never Used     Counseling given: No   Past Medical History:  Diagnosis Date  . Allergy    allegic rhinitis  . Anemia   . Angiodysplasia of colon   . Anxiety   . Arthritis   . Blood transfusion   . Borderline diabetic    per patient medical history form  . Breast cancer ([place]) 02/2009   breast CA L invasive ductal CA(hormone receptor positive.)  . Cataract   . Colon polyp    hyperplastic  . Colon stricture ([place]) 01/14/2003  . Diabetes mellitus    pt's medical doctor took her off diabetic meds-blood sugars have not been a problem--and medications were causing pt's sugars to be too low  . Diverticulosis   . Esophageal stricture 02/04/2008  . GERD (gastroesophageal reflux disease)   . GI bleed   . Hepatic steatosis   . Hiatal hernia   . Hyperlipidemia   . Hypertension   . Hypothyroidism   . IBS (irritable bowel syndrome)   . Melena 01/2008   anemia transfusion  . Menopause    per patient medical history form  . Osteoporosis   . Postmenopausal hormone therapy    per patient medical history form.  Marland Kitchen RLS (restless legs syndrome)   . Salmonella 05/2000   renal effects secondary to dehydration  . Seizure disorder ([place])   . Seizures ([place])    one seizure several yrs ago --etiology unknown-no problem since   Past Surgical History:  Procedure Laterality Date  . ABDOMINAL  HYSTERECTOMY  1983  . BREAST SURGERY  02/2009   breast biopsy invasive ductal CA-bilateral mastectomies  . EPIGASTRIC HERNIA REPAIR  01/02/2012   Procedure: HERNIA REPAIR EPIGASTRIC ADULT;  Surgeon: Pedro Earls, MD;  Location: [facility];  Service: General;  Laterality: N/A;  Laparoscopic Repair Paraesophageal Hernia, Nissen  . LAPAROSCOPIC NISSEN FUNDOPLICATION  17/49/4496   Procedure: LAPAROSCOPIC NISSEN FUNDOPLICATION;  Surgeon: Pedro Earls, MD;  Location: [facility];  Service: General;  Laterality: N/A;  . MASTECTOMY  04/2009   Bilateral for ductal carcinoma on L  . OVARY SURGERY  1986   ovarian tumor removed   Family History  Problem Relation Age of Onset  . Heart attack Father   . Diabetes Father   . Esophageal cancer Father   . Breast cancer Sister        x 2 sisters  . Diabetes Sister   . Hypertension Brother   . Diabetes Brother   . Hypertension Mother   . Stroke Mother   . Breast cancer Mother   . Hypertension Sister    History  Sexual Activity  . Sexual activity: Not Currently    Outpatient Encounter Prescriptions as of 08/22/2016  Medication Sig  . amLODipine (NORVASC) 5  MG tablet TAKE ONE TABLET BY MOUTH ONCE DAILY  . aspirin EC 81 MG tablet Take 81 mg by mouth daily.  Marland Kitchen ezetimibe (ZETIA) 10 MG tablet Take 1 tablet (10 mg total) by mouth daily.  . ferrous sulfate 325 (65 FE) MG tablet Take 1 tablet (325 mg total) by mouth daily with breakfast.  . fluticasone (FLONASE) 50 MCG/ACT nasal spray Place 2 sprays into both nostrils daily as needed. Allergies  . hydrochlorothiazide (HYDRODIURIL) 25 MG tablet Take 1 tablet (25 mg total) by mouth daily.  Marland Kitchen levothyroxine (SYNTHROID, LEVOTHROID) 25 MCG tablet TAKE ONE TABLET BY MOUTH ONCE DAILY BEFORE BREAKFAST  . losartan (COZAAR) 100 MG tablet TAKE ONE TABLET BY MOUTH ONCE DAILY  . metFORMIN (GLUCOPHAGE-XR) 500 MG 24 hr tablet Take 3 tablets (1,500 mg total) by mouth daily with breakfast.  . Multiple Vitamin (MULTIVITAMIN  WITH MINERALS) TABS tablet Take 1 tablet by mouth daily.  Marland Kitchen omeprazole (PRILOSEC) 20 MG capsule TAKE ONE CAPSULE BY MOUTH TWICE DAILY BEFORE MEAL(S)  . rOPINIRole (REQUIP) 1 MG tablet TAKE ONE TABLET BY MOUTH AT BEDTIME  . sertraline (ZOLOFT) 100 MG tablet TAKE ONE TABLET BY MOUTH AT BEDTIME   No facility-administered encounter medications on file as of 08/22/2016.     Activities of Daily Living In your present state of health, do you have any difficulty performing the following activities: 08/22/2016  Hearing? N  Vision? Y  Difficulty concentrating or making decisions? N  Walking or climbing stairs? Y  Dressing or bathing? N  Doing errands, shopping? N  Preparing Food and eating ? N  Using the Toilet? N  In the past six months, have you accidently leaked urine? N  Do you have problems with loss of bowel control? Y  Managing your Medications? N  Managing your Finances? N  Housekeeping or managing your Housekeeping? N  Some recent data might be hidden    Patient Care Team: Tower, Wynelle Fanny, MD as PCP - General    Assessment:     Hearing Screening   125Hz  250Hz  500Hz  1000Hz  2000Hz  3000Hz  4000Hz  6000Hz  8000Hz   Right ear:   40 40 40  40    Left ear:   40 40 40  40    Vision Screening Comments: Last vision exam in 2018 with Dr. Gershon Crane and Dr. Zadie Rhine   Exercise Activities and Dietary recommendations Current Exercise Habits: Home exercise routine, Type of exercise: walking, Time (Minutes): 30, Frequency (Times/Week): 7, Weekly Exercise (Minutes/Week): 210, Intensity: Mild, Exercise limited by: None identified  Goals    . Weight (lb) < 135 lb (61.2 kg)          When schedule permits, I will join [employer] and begin exercising at local gym.       Fall Risk Fall Risk  08/22/2016 02/24/2016 01/26/2015 01/07/2014 12/06/2012  Falls in the past year? No No Yes Yes No  Number falls in past yr: - - 1 1 -  Injury with Fall? - - No Yes -   Depression Screen PHQ 2/9 Scores 08/22/2016  01/26/2015 01/07/2014 12/06/2012  PHQ - 2 Score 0 0 0 0     Cognitive Function MMSE - Mini Mental State Exam 08/22/2016  Orientation to time 5  Orientation to Place 5  Registration 3  Attention/ Calculation 0  Recall 3  Language- name 2 objects 0  Language- repeat 1  Language- follow 3 step command 3  Language- read & follow direction 0  Write a sentence 0  Copy design 0  Total score 20       PLEASE NOTE: A Mini-Cog screen was completed. Maximum score is 20. A value of 0 denotes this part of [place] MMSE was not completed or the patient failed this part of the Mini-Cog screening.   Mini-Cog Screening Orientation to Time - Max 5 pts Orientation to Place - Max 5 pts Registration - Max 3 pts Recall - Max 3 pts Language Repeat - Max 1 pts Language Follow 3 Step Command - Max 3 pts   Immunization History  Administered Date(s) Administered  . Influenza Split 01/17/2011  . Influenza Whole 04/04/2005, 01/22/2009  . Influenza,inj,Quad PF,36+ Mos 12/06/2012, 11/25/2013, 01/26/2015, 11/12/2015  . Pneumococcal Conjugate-13 01/26/2015  . Pneumococcal Polysaccharide-23 02/10/2008, 01/23/2014  . Tdap 07/19/2011   Screening Tests Health Maintenance  Topic Date Due  . MAMMOGRAM  07/18/2020 (Originally 03/24/2011)  . Hepatitis C Screening  04/22/2023 (Originally 1946-02-22)  . INFLUENZA VACCINE  10/18/2016  . HEMOGLOBIN A1C  12/07/2016  . OPHTHALMOLOGY EXAM  06/06/2017  . FOOT EXAM  06/09/2017  . TETANUS/TDAP  07/18/2021  . COLONOSCOPY  03/24/2024  . DEXA SCAN  Completed  . PNA vac Low Risk Adult  Completed      Plan:     I have personally reviewed and addressed the Medicare Annual Wellness questionnaire and have noted the following in the patient's chart:  A. Medical and social history B. Use of alcohol, tobacco or illicit drugs  C. Current medications and supplements D. Functional ability and status E.  Nutritional status F.  Physical activity G. Advance directives H. List  of other physicians I.  Hospitalizations, surgeries, and ER visits in previous 12 months J.  [place] to include hearing, vision, cognitive, depression L. Referrals and appointments - none  In addition, I have reviewed and discussed with patient certain preventive protocols, quality metrics, and best practice recommendations. A written personalized care plan for preventive services as well as general preventive health recommendations were provided to patient.  See attached scanned questionnaire for additional information.   Signed,   Lindell Noe, MHA, BS, LPN Health Coach

## 2016-08-22 NOTE — Progress Notes (Signed)
Pre visit review using our clinic review tool, if applicable. No additional management support is needed unless otherwise documented below in the visit note. 

## 2016-08-23 ENCOUNTER — Ambulatory Visit (HOSPITAL_BASED_OUTPATIENT_CLINIC_OR_DEPARTMENT_OTHER): Payer: Medicare Other | Admitting: Adult Health

## 2016-08-23 ENCOUNTER — Encounter: Payer: Self-pay | Admitting: Adult Health

## 2016-08-23 ENCOUNTER — Encounter: Payer: Medicare Other | Admitting: Nurse Practitioner

## 2016-08-23 VITALS — BP 145/69 | HR 93 | Temp 98.2°F | Resp 17 | Ht 63.5 in | Wt 157.1 lb

## 2016-08-23 DIAGNOSIS — C50911 Malignant neoplasm of unspecified site of right female breast: Secondary | ICD-10-CM | POA: Diagnosis not present

## 2016-08-23 DIAGNOSIS — Z170 Estrogen receptor positive status [ER+]: Secondary | ICD-10-CM

## 2016-08-23 DIAGNOSIS — Z79811 Long term (current) use of aromatase inhibitors: Secondary | ICD-10-CM | POA: Diagnosis not present

## 2016-08-23 NOTE — Telephone Encounter (Signed)
Left voicemail requesting pt to call the office back 

## 2016-08-23 NOTE — Progress Notes (Signed)
CLINIC:  Survivorship   REASON FOR VISIT:  Routine follow-up for history of breast cancer.   BRIEF ONCOLOGIC HISTORY:    Breast cancer, right breast ([place])   02/17/2009 Initial Diagnosis    Left breast invasive ductal carcinoma 9:00 position      05/11/2009 Surgery    Right breast mastectomy fibrocystic changes: Left breast mastectomy: multifocal IDC and DCIS - 1.2 and 0.7 cm; 0/5 LN: Grade 1, ER 100%, PR 100%, Ki-67 36% and 19%; HER-2 negative (ratio 1 and 1.13)      05/11/2009 Pathologic Stage    Stage IA: T1c N0      06/25/2009 - 06/2014 Anti-estrogen oral therapy    Arimidex 1 mg daily discontinued due to aches and pains changed to tamoxifen 20 mg daily        INTERVAL HISTORY:  Ms. Asbill presents to the [facility] today for routine follow-up for her history of breast cancer.  Overall, she reports feeling quite well. She continues to have phantom breast pain that has been present ever since her breast surgery.  She denies any new or changed pain.  She does have some nausea and saw her PCP yesterday abotu her IBS.  She sees her PCP every three months.  She has not had a skin cancer check by dermatology and is considering this.      REVIEW OF SYSTEMS:  Review of Systems  Constitutional: Negative for appetite change, chills, diaphoresis, fatigue, fever and unexpected weight change.  HENT:   Negative for hearing loss and lump/mass.   Eyes: Negative for eye problems and icterus.  Respiratory: Negative for chest tightness, cough and shortness of breath.   Cardiovascular: Negative for chest pain, leg swelling and palpitations.  Gastrointestinal: Negative for abdominal distention and abdominal pain.  Endocrine: Negative for hot flashes.  Genitourinary: Negative for difficulty urinating.   Musculoskeletal: Negative for arthralgias.  Skin: Negative for itching and rash.  Neurological: Negative.  Negative for dizziness, extremity weakness and headaches.  Hematological:  Negative for adenopathy. Does not bruise/bleed easily.  Psychiatric/Behavioral: Negative for depression. The patient is not nervous/anxious.   Breast: Denies any new nodularity, masses, tenderness, nipple changes, or nipple discharge.      PAST MEDICAL/SURGICAL HISTORY:  Past Medical History:  Diagnosis Date  . Allergy    allegic rhinitis  . Anemia   . Angiodysplasia of colon   . Anxiety   . Arthritis   . Blood transfusion   . Borderline diabetic    per patient medical history form  . Breast cancer ([place]) 02/2009   breast CA L invasive ductal CA(hormone receptor positive.)  . Cataract   . Colon polyp    hyperplastic  . Colon stricture ([place]) 01/14/2003  . Diabetes mellitus    pt's medical doctor took her off diabetic meds-blood sugars have not been a problem--and medications were causing pt's sugars to be too low  . Diverticulosis   . Esophageal stricture 02/04/2008  . GERD (gastroesophageal reflux disease)   . GI bleed   . Hepatic steatosis   . Hiatal hernia   . Hyperlipidemia   . Hypertension   . Hypothyroidism   . IBS (irritable bowel syndrome)   . Melena 01/2008   anemia transfusion  . Menopause    per patient medical history form  . Osteoporosis   . Postmenopausal hormone therapy    per patient medical history form.  Marland Kitchen RLS (restless legs syndrome)   . Salmonella 05/2000   renal effects secondary  to dehydration  . Seizure disorder ([place])   . Seizures ([place])    one seizure several yrs ago --etiology unknown-no problem since   Past Surgical History:  Procedure Laterality Date  . ABDOMINAL HYSTERECTOMY  1983  . BREAST SURGERY  02/2009   breast biopsy invasive ductal CA-bilateral mastectomies  . EPIGASTRIC HERNIA REPAIR  01/02/2012   Procedure: HERNIA REPAIR EPIGASTRIC ADULT;  Surgeon: Pedro Earls, MD;  Location: [facility];  Service: General;  Laterality: N/A;  Laparoscopic Repair Paraesophageal Hernia, Nissen  . LAPAROSCOPIC NISSEN FUNDOPLICATION  18/84/1660     Procedure: LAPAROSCOPIC NISSEN FUNDOPLICATION;  Surgeon: Pedro Earls, MD;  Location: [facility];  Service: General;  Laterality: N/A;  . MASTECTOMY  04/2009   Bilateral for ductal carcinoma on L  . OVARY SURGERY  1986   ovarian tumor removed     ALLERGIES:  Allergies  Allergen Reactions  . Arimidex [Anastrozole]     Joint pain  . Glipizide Other (See Comments)    Hypoglycemia   . Metformin And Related     diarrhea  . Penicillins Rash     CURRENT MEDICATIONS:  Outpatient Encounter Prescriptions as of 08/23/2016  Medication Sig  . amLODipine (NORVASC) 5 MG tablet TAKE ONE TABLET BY MOUTH ONCE DAILY  . aspirin EC 81 MG tablet Take 81 mg by mouth daily.  Marland Kitchen ezetimibe (ZETIA) 10 MG tablet Take 1 tablet (10 mg total) by mouth daily.  . fluticasone (FLONASE) 50 MCG/ACT nasal spray Place 2 sprays into both nostrils daily as needed. Allergies  . hydrochlorothiazide (HYDRODIURIL) 25 MG tablet Take 1 tablet (25 mg total) by mouth daily.  Marland Kitchen levothyroxine (SYNTHROID, LEVOTHROID) 25 MCG tablet TAKE ONE TABLET BY MOUTH ONCE DAILY BEFORE BREAKFAST  . losartan (COZAAR) 100 MG tablet TAKE ONE TABLET BY MOUTH ONCE DAILY  . metFORMIN (GLUCOPHAGE-XR) 500 MG 24 hr tablet Take 3 tablets (1,500 mg total) by mouth daily with breakfast.  . Multiple Vitamin (MULTIVITAMIN WITH MINERALS) TABS tablet Take 1 tablet by mouth daily.  Marland Kitchen omeprazole (PRILOSEC) 20 MG capsule TAKE ONE CAPSULE BY MOUTH TWICE DAILY BEFORE MEAL(S)  . rOPINIRole (REQUIP) 1 MG tablet TAKE ONE TABLET BY MOUTH AT BEDTIME  . sertraline (ZOLOFT) 100 MG tablet TAKE ONE TABLET BY MOUTH AT BEDTIME  . [DISCONTINUED] ferrous sulfate 325 (65 FE) MG tablet Take 1 tablet (325 mg total) by mouth daily with breakfast. (Patient not taking: Reported on 08/23/2016)   No facility-administered encounter medications on file as of 08/23/2016.      ONCOLOGIC FAMILY HISTORY:  Family History  Problem Relation Age of Onset  . Heart attack Father   .  Diabetes Father   . Esophageal cancer Father   . Breast cancer Sister        x 2 sisters  . Diabetes Sister   . Hypertension Brother   . Diabetes Brother   . Hypertension Mother   . Stroke Mother   . Breast cancer Mother   . Hypertension Sister     GENETIC COUNSELING/TESTING: Genetic testing in 2010 normal, declines re-referral to genetics for updated testing  SOCIAL HISTORY:  MADELINE PHO is married and lives with husband in [place], [place].  She has 3 children and they live in [place], [place], and [place].  Ms. Sabourin is currently retired.  She denies any current or history of tobacco, alcohol, or illicit drug use.     PHYSICAL EXAMINATION:  Vital Signs: Vitals:   08/23/16 1022  BP: Marland Kitchen)  145/69  Pulse: 93  Resp: 17  Temp: 98.2 F (36.8 C)   Filed Weights   08/23/16 1022  Weight: 157 lb 1.6 oz (71.3 kg)   General: Well-nourished, well-appearing female in no acute distress.  Unaccompaniedtoday.   HEENT: Head is normocephalic.  Pupils equal and reactive to light. Conjunctivae clear without exudate.  Sclerae anicteric. Oral mucosa is pink, moist.  Oropharynx is pink without lesions or erythema.  Lymph: No cervical, supraclavicular, or infraclavicular lymphadenopathy noted on palpation.  Cardiovascular: Regular rate and rhythm.Marland Kitchen Respiratory: Clear to auscultation bilaterally. Chest expansion symmetric; breathing non-labored.  Breast Exam:  Breasts: s/p bilateral mastectomy.  No nodularity, swelling, masses, or skin thickening noted. -Axilla: No axillary adenopathy bilaterally.  GI: Abdomen soft and round; non-tender, non-distended. Bowel sounds normoactive. No hepatosplenomegaly.   GU: Deferred.  Neuro: No focal deficits. Steady gait.  Psych: Mood and affect normal and appropriate for situation.  Extremities: No edema. Skin: Warm and dry.  LABORATORY DATA:  None for this visit   DIAGNOSTIC IMAGING:  Most recent mammogram: n/a bilateral mastectomy     ASSESSMENT AND PLAN:  Ms.. Liberati is a pleasant 70 y.o. female with history of Stage IA right breast invasive ductal carcinoma, ER+/PR+/HER2-, diagnosed in 03/2008, treated with mastectomy and anti-estrogen therapy with Tamoxifen x 5 years finishing in 06/2014.  She presents to the [facility] for surveillance and routine follow-up.   1. History of breast cancer:  Ms. Hyden is currently clinically and radiographically without evidence of disease or recurrence of breast cancer.   I encouraged her to call me with any questions or concerns before her next visit at the cancer center, and I would be happy to see her sooner, if needed.    2. Bone health:  Given Ms. Ginsberg's age, history of breast cancer, and her previous anti estrogen therapy with Anastrozole, she is at risk for bone demineralization. Her last DEXA scan was in 05/2013 and was normal.  In the meantime, she was encouraged to increase her consumption of foods rich in calcium, as well as increase her weight-bearing activities.  She was given education on specific food and activities to promote bone health.  3. Cancer screening:  Due to Ms. Payne history and her age, she should receive screening for skin cancers, colon cancer. cancers. She was encouraged to follow-up with her PCP for appropriate cancer screenings.   4. Health maintenance and wellness promotion: Ms. Janssens was encouraged to consume 5-7 servings of fruits and vegetables per day. She was also encouraged to engage in moderate to vigorous exercise for 30 minutes per day most days of the week. She was instructed to limit her alcohol consumption and continue to abstain from tobacco use.    Dispo:  -Return to cancer center in one year for LTS follow up   A total of (30) minutes of face-to-face time was spent with this patient with greater than 50% of that time in counseling and care-coordination.   Gardenia Phlegm, NP Survivorship Program [facility] [phone]   Note: PRIMARY CARE PROVIDER Abner Greenspan, [place] [phone]

## 2016-09-04 ENCOUNTER — Encounter: Payer: Self-pay | Admitting: Family Medicine

## 2016-09-04 ENCOUNTER — Ambulatory Visit (INDEPENDENT_AMBULATORY_CARE_PROVIDER_SITE_OTHER): Payer: Medicare Other | Admitting: Family Medicine

## 2016-09-04 VITALS — BP 138/78 | HR 90 | Temp 98.1°F | Ht 63.5 in | Wt 159.0 lb

## 2016-09-04 DIAGNOSIS — M1A0790 Idiopathic chronic gout, unspecified ankle and foot, without tophus (tophi): Secondary | ICD-10-CM | POA: Diagnosis not present

## 2016-09-04 DIAGNOSIS — K3184 Gastroparesis: Secondary | ICD-10-CM | POA: Diagnosis not present

## 2016-09-04 DIAGNOSIS — E785 Hyperlipidemia, unspecified: Secondary | ICD-10-CM | POA: Diagnosis not present

## 2016-09-04 DIAGNOSIS — E1140 Type 2 diabetes mellitus with diabetic neuropathy, unspecified: Secondary | ICD-10-CM | POA: Diagnosis not present

## 2016-09-04 DIAGNOSIS — K582 Mixed irritable bowel syndrome: Secondary | ICD-10-CM | POA: Diagnosis not present

## 2016-09-04 DIAGNOSIS — E1143 Type 2 diabetes mellitus with diabetic autonomic (poly)neuropathy: Secondary | ICD-10-CM

## 2016-09-04 DIAGNOSIS — E1142 Type 2 diabetes mellitus with diabetic polyneuropathy: Secondary | ICD-10-CM

## 2016-09-04 DIAGNOSIS — E039 Hypothyroidism, unspecified: Secondary | ICD-10-CM | POA: Diagnosis not present

## 2016-09-04 DIAGNOSIS — I10 Essential (primary) hypertension: Secondary | ICD-10-CM | POA: Diagnosis not present

## 2016-09-04 DIAGNOSIS — E1165 Type 2 diabetes mellitus with hyperglycemia: Secondary | ICD-10-CM

## 2016-09-04 LAB — CBC WITH DIFFERENTIAL/PLATELET
Basophils Absolute: 0 10*3/uL (ref 0.0–0.1)
Basophils Relative: 0.4 % (ref 0.0–3.0)
EOS PCT: 0.9 % (ref 0.0–5.0)
Eosinophils Absolute: 0 10*3/uL (ref 0.0–0.7)
HCT: 35.4 % — ABNORMAL LOW (ref 36.0–46.0)
HEMOGLOBIN: 11.8 g/dL — AB (ref 12.0–15.0)
LYMPHS PCT: 28 % (ref 12.0–46.0)
Lymphs Abs: 1.5 10*3/uL (ref 0.7–4.0)
MCHC: 33.3 g/dL (ref 30.0–36.0)
MCV: 82 fl (ref 78.0–100.0)
MONO ABS: 0.4 10*3/uL (ref 0.1–1.0)
Monocytes Relative: 7.4 % (ref 3.0–12.0)
Neutro Abs: 3.4 10*3/uL (ref 1.4–7.7)
Neutrophils Relative %: 63.3 % (ref 43.0–77.0)
Platelets: 121 10*3/uL — ABNORMAL LOW (ref 150.0–400.0)
RBC: 4.32 Mil/uL (ref 3.87–5.11)
RDW: 15.7 % — ABNORMAL HIGH (ref 11.5–15.5)
WBC: 5.4 10*3/uL (ref 4.0–10.5)

## 2016-09-04 LAB — COMPREHENSIVE METABOLIC PANEL
ALBUMIN: 4.4 g/dL (ref 3.5–5.2)
ALK PHOS: 70 U/L (ref 39–117)
ALT: 26 U/L (ref 0–35)
AST: 32 U/L (ref 0–37)
BUN: 18 mg/dL (ref 6–23)
CO2: 25 mEq/L (ref 19–32)
CREATININE: 0.77 mg/dL (ref 0.40–1.20)
Calcium: 9.5 mg/dL (ref 8.4–10.5)
Chloride: 103 mEq/L (ref 96–112)
GFR: 78.99 mL/min (ref >60.00)
Glucose, Bld: 159 mg/dL — ABNORMAL HIGH (ref 70–99)
Potassium: 3.8 mEq/L (ref 3.5–5.1)
SODIUM: 139 meq/L (ref 135–145)
TOTAL PROTEIN: 7.6 g/dL (ref 6.0–8.3)
Total Bilirubin: 0.3 mg/dL (ref 0.2–1.2)

## 2016-09-04 LAB — LIPID PANEL
CHOLESTEROL: 149 mg/dL (ref 0–200)
HDL: 29.2 mg/dL — ABNORMAL LOW (ref >39.00)
NonHDL: 120.15
Total CHOL/HDL Ratio: 5
Triglycerides: 358 mg/dL — ABNORMAL HIGH (ref 0.0–149.0)
VLDL: 71.6 mg/dL — ABNORMAL HIGH (ref 0.0–40.0)

## 2016-09-04 LAB — HEMOGLOBIN A1C: Hgb A1c MFr Bld: 8.3 % — ABNORMAL HIGH (ref 4.6–6.5)

## 2016-09-04 LAB — LDL CHOLESTEROL, DIRECT: LDL DIRECT: 85 mg/dL

## 2016-09-04 LAB — TSH: TSH: 6.09 u[IU]/mL — AB (ref 0.35–4.50)

## 2016-09-04 NOTE — Assessment & Plan Note (Signed)
bp in fair control at this time  BP Readings from Last 1 Encounters:  09/04/16 138/78   No changes needed Disc lifstyle change with low sodium diet and exercise  Labs today

## 2016-09-04 NOTE — Assessment & Plan Note (Signed)
occ blood in stool-pt susp hemorrhoids Int diarrhea and constipation  utd colonoscopy  Has gone to [place] today

## 2016-09-04 NOTE — Assessment & Plan Note (Signed)
Lab Results  Component Value Date   LABURIC 7.3 (H) 06/06/2016   No longer on an agent for this  Disc low purine diet  No flares lately  Enc good hydration

## 2016-09-04 NOTE — Progress Notes (Signed)
Subjective:    Patient ID: Valerie Ball, female    DOB: [birth year], 71 y.o.   MRN: [id number]  HPI Here for f/u of chronic medical problems   She has continued to have IBS symptoms with intermittent constipation and diarrhea Now occ with blood  BRB-occ drip and sometimes in mucous on the stool  Thinks she may have hemorrhoids   Diet and exercise  occ eats too much watermelon Otherwise great diet - salads and fish and chicken  Swimming/ yard work and walks the dog - pretty well   Wt Readings from Last 3 Encounters:  09/04/16 159 lb (72.1 kg)  08/23/16 157 lb 1.6 oz (71.3 kg)  08/22/16 156 lb 12 oz (71.1 kg)  overall stable (up or down 2 lb)  bmi 27.2  Hx of gout  uloric bothered her stomach Lab Results  Component Value Date   LABURIC 7.3 (H) 06/06/2016   bp is stable today  No cp or palpitations or headaches or edema  No side effects to medicines  BP Readings from Last 3 Encounters:  09/04/16 138/78  08/23/16 (!) 145/69  08/22/16 130/84      Hx of DM with gastroparesis and neuropathy Lab Results  Component Value Date   HGBA1C 7.7 (H) 06/06/2016  due for A1C in July  Last visit we inc her metformin xr to 1500 mg daily- unsure if it has helped  She has not checked blood sugar in a few weeks (? Broken meter)  Before she stopped checking - still a up a bit in the ams (150s), and then later lower  No hypoglycemia  Cannot tolerate glipizide in 2016 due to low glucose  Eye exam 3/18  Needs a free style lite meter     Lab Results  Component Value Date   CHOL 192 06/06/2016   HDL 32.40 (L) 06/06/2016   LDLDIRECT 108.0 06/06/2016   TRIG 386.0 (H) 06/06/2016   CHOLHDL 6 06/06/2016   Cannot tolerate statin and could not afford zetia -now she is back on zetia  Watching diet closely   Hx of low platelets  Lab Results  Component Value Date   WBC 6.8 06/06/2016   HGB 12.7 06/06/2016   HCT 38.3 06/06/2016   MCV 82.2 06/06/2016   PLT 127.0 (L) 06/06/2016       Legs and feet hurt at night  She is constantly moving them -hard to sleep  Bottom of feet burn from DM neuropathy Some shin pain on L leg Hands bother her also   Hypothyroidism  Pt has no clinical changes No change in energy level/ hair or skin/ edema and no tremor Lab Results  Component Value Date   TSH 6.43 (H) 06/06/2016      Patient Active Problem List   Diagnosis Date Noted  . Diabetic gastroparesis ([place]) 03/27/2016  . Retinal vein occlusion, branch 01/03/2016  . AVM (arteriovenous malformation) of colon 05/18/2015  . IBS (irritable bowel syndrome) 02/28/2015  . Cough 12/08/2014  . Diverticulitis of colon 10/14/2014  . Gout 07/22/2014  . Hypothyroidism 04/23/2014  . Heme + stool 02/18/2014  . Nausea without vomiting 02/18/2014  . Elevated LFTs 02/18/2014  . Occult blood positive stool 02/03/2014  . Encounter for Medicare annual wellness exam 01/23/2014  . Estrogen deficiency 01/23/2014  . Colon cancer screening 01/23/2014  . Abdominal pain, epigastric 01/20/2014  . Tremor 01/20/2014  . RUQ abdominal pain 01/20/2014  . Dizziness 01/07/2014  . Allergic rhinitis 01/07/2014  . Thrombocytopenia (  [place]) 11/25/2013  . Diabetic neuropathy ([place]) 11/25/2013  . History of breast cancer 06/07/2012  . Lap Nissen October 2013 01/19/2012  . Large type III mixed hiatus hernia  12/06/2011  . Stress reaction 10/25/2011  . Routine general medical examination at a health care facility 07/11/2011  . ADVERSE DRUG REACTION 05/05/2010  . TRANSAMINASES, SERUM, ELEVATED 11/11/2009  . Breast cancer, right breast ([place]) 08/02/2009  . DIVERTICULOSIS-COLON 03/03/2008  . ESOPHAGEAL STRICTURE 03/02/2008  . HIATAL HERNIA 03/02/2008  . PERITONEAL ADHESIONS 03/02/2008  . Anemia 02/07/2008  . Poorly controlled type 2 diabetes mellitus with neuropathy ([place]) 08/19/2007  . ANXIETY 04/15/2007  . DISORDERS, ORGANIC SLEEP NEC 08/23/2006  . SYNDROME, RESTLESS LEGS 08/23/2006  . SYNDROME, CARPAL  TUNNEL 08/23/2006  . Hyperlipidemia LDL goal <100 08/22/2006  . Essential hypertension 08/22/2006  . ALLERGIC RHINITIS 08/22/2006  . GERD 08/22/2006  . Fatty liver 08/22/2006  . History of seizure 08/22/2006   Past Medical History:  Diagnosis Date  . Allergy    allegic rhinitis  . Anemia   . Angiodysplasia of colon   . Anxiety   . Arthritis   . Blood transfusion   . Borderline diabetic    per patient medical history form  . Breast cancer ([place]) 02/2009   breast CA L invasive ductal CA(hormone receptor positive.)  . Cataract   . Colon polyp    hyperplastic  . Colon stricture ([place]) 01/14/2003  . Diabetes mellitus    pt's medical doctor took her off diabetic meds-blood sugars have not been a problem--and medications were causing pt's sugars to be too low  . Diverticulosis   . Esophageal stricture 02/04/2008  . GERD (gastroesophageal reflux disease)   . GI bleed   . Hepatic steatosis   . Hiatal hernia   . Hyperlipidemia   . Hypertension   . Hypothyroidism   . IBS (irritable bowel syndrome)   . Melena 01/2008   anemia transfusion  . Menopause    per patient medical history form  . Osteoporosis   . Postmenopausal hormone therapy    per patient medical history form.  Marland Kitchen RLS (restless legs syndrome)   . Salmonella 05/2000   renal effects secondary to dehydration  . Seizure disorder ([place])   . Seizures ([place])    one seizure several yrs ago --etiology unknown-no problem since   Past Surgical History:  Procedure Laterality Date  . ABDOMINAL HYSTERECTOMY  1983  . BREAST SURGERY  02/2009   breast biopsy invasive ductal CA-bilateral mastectomies  . EPIGASTRIC HERNIA REPAIR  01/02/2012   Procedure: HERNIA REPAIR EPIGASTRIC ADULT;  Surgeon: Pedro Earls, MD;  Location: [facility];  Service: General;  Laterality: N/A;  Laparoscopic Repair Paraesophageal Hernia, Nissen  . LAPAROSCOPIC NISSEN FUNDOPLICATION  24/26/8341   Procedure: LAPAROSCOPIC NISSEN FUNDOPLICATION;  Surgeon:  Pedro Earls, MD;  Location: [facility];  Service: General;  Laterality: N/A;  . MASTECTOMY  04/2009   Bilateral for ductal carcinoma on L  . OVARY SURGERY  1986   ovarian tumor removed   Social History  Substance Use Topics  . Smoking status: Never Smoker  . Smokeless tobacco: Never Used  . Alcohol use No   Family History  Problem Relation Age of Onset  . Heart attack Father   . Diabetes Father   . Esophageal cancer Father   . Breast cancer Sister        x 2 sisters  . Diabetes Sister   . Hypertension Brother   . Diabetes  Brother   . Hypertension Mother   . Stroke Mother   . Breast cancer Mother   . Hypertension Sister    Allergies  Allergen Reactions  . Arimidex [Anastrozole]     Joint pain  . Glipizide Other (See Comments)    Hypoglycemia   . Metformin And Related     diarrhea  . Penicillins Rash   Current Outpatient Prescriptions on File Prior to Visit  Medication Sig Dispense Refill  . amLODipine (NORVASC) 5 MG tablet TAKE ONE TABLET BY MOUTH ONCE DAILY 90 tablet 1  . aspirin EC 81 MG tablet Take 81 mg by mouth daily.    Marland Kitchen ezetimibe (ZETIA) 10 MG tablet Take 1 tablet (10 mg total) by mouth daily. 30 tablet 11  . fluticasone (FLONASE) 50 MCG/ACT nasal spray Place 2 sprays into both nostrils daily as needed. Allergies 48 g 3  . hydrochlorothiazide (HYDRODIURIL) 25 MG tablet Take 1 tablet (25 mg total) by mouth daily. 30 tablet 11  . levothyroxine (SYNTHROID, LEVOTHROID) 25 MCG tablet TAKE ONE TABLET BY MOUTH ONCE DAILY BEFORE BREAKFAST 90 tablet 1  . losartan (COZAAR) 100 MG tablet TAKE ONE TABLET BY MOUTH ONCE DAILY 90 tablet 1  . metFORMIN (GLUCOPHAGE-XR) 500 MG 24 hr tablet Take 3 tablets (1,500 mg total) by mouth daily with breakfast. 90 tablet 11  . Multiple Vitamin (MULTIVITAMIN WITH MINERALS) TABS tablet Take 1 tablet by mouth daily.    Marland Kitchen omeprazole (PRILOSEC) 20 MG capsule TAKE ONE CAPSULE BY MOUTH TWICE DAILY BEFORE MEAL(S) 180 capsule 2  . rOPINIRole  (REQUIP) 1 MG tablet TAKE ONE TABLET BY MOUTH AT BEDTIME 90 tablet 3  . sertraline (ZOLOFT) 100 MG tablet TAKE ONE TABLET BY MOUTH AT BEDTIME 90 tablet 1  . [DISCONTINUED] cetirizine (ZYRTEC) 10 MG tablet Take 10 mg by mouth daily as needed. For allergy symptoms     No current facility-administered medications on file prior to visit.     Review of Systems  Constitutional: Positive for fatigue. Negative for activity change, appetite change, fever and unexpected weight change.  HENT: Negative for congestion, rhinorrhea, sore throat and trouble swallowing.   Eyes: Negative for pain, redness, itching and visual disturbance.  Respiratory: Negative for cough, chest tightness, shortness of breath and wheezing.   Cardiovascular: Negative for chest pain and palpitations.  Gastrointestinal: Positive for blood in stool, constipation, diarrhea and nausea. Negative for abdominal pain, rectal pain and vomiting.  Endocrine: Negative for cold intolerance, heat intolerance, polydipsia and polyuria.  Genitourinary: Negative for difficulty urinating, dysuria, frequency and urgency.  Musculoskeletal: Negative for arthralgias, joint swelling and myalgias.  Skin: Negative for pallor and rash.  Neurological: Negative for dizziness, tremors, weakness, numbness and headaches.  Hematological: Negative for adenopathy. Does not bruise/bleed easily.  Psychiatric/Behavioral: Negative for decreased concentration and dysphoric mood. The patient is not nervous/anxious.       Objective:   Physical Exam  Constitutional: She appears well-developed and well-nourished. No distress.  Well appearing   HENT:  Head: Normocephalic and atraumatic.  Mouth/Throat: Oropharynx is clear and moist.  Eyes: Conjunctivae and EOM are normal. Pupils are equal, round, and reactive to light.  Neck: Normal range of motion. Neck supple. No JVD present. Carotid bruit is not present. No thyromegaly present.  Cardiovascular: Normal rate, regular  rhythm, normal heart sounds and intact distal pulses.  Exam reveals no gallop.   Pulmonary/Chest: Effort normal and breath sounds normal. No respiratory distress. She has no wheezes. She has no rales.  No crackles  Abdominal: Soft. Bowel sounds are normal. She exhibits no distension, no abdominal bruit and no mass. There is no hepatosplenomegaly. There is no tenderness. There is no rebound, no guarding and no CVA tenderness.  Musculoskeletal: She exhibits no edema or tenderness.  No acute joint changes  Lymphadenopathy:    She has no cervical adenopathy.  Neurological: She is alert. She has normal reflexes. No cranial nerve deficit. She exhibits normal muscle tone. Coordination normal.  Skin: Skin is warm and dry. No rash noted. No pallor.  Psychiatric: She has a normal mood and affect.          Assessment & Plan:   Problem List Items Addressed This Visit      Cardiovascular and Mediastinum   Essential hypertension - Primary    bp in fair control at this time  BP Readings from Last 1 Encounters:  09/04/16 138/78   No changes needed Disc lifstyle change with low sodium diet and exercise  Labs today      Relevant Orders   CBC with Differential/Platelet (Completed)   Comprehensive metabolic panel (Completed)   Lipid panel (Completed)   TSH (Completed)     Digestive   Diabetic gastroparesis (HCC)    Stable  Enc good DM control        IBS (irritable bowel syndrome)    occ blood in stool-pt susp hemorrhoids Int diarrhea and constipation  utd colonoscopy  Has gone to [facility] today        Endocrine   Diabetic neuropathy (HCC)    Stable  Enc good glucose control      Hypothyroidism    TSH today  C/o some fatigue       Relevant Orders   TSH (Completed)   Poorly controlled type 2 diabetes mellitus with neuropathy (HCC)    A1C today  Diet is fair  Now on metformin xr 1500 mg  Needs new meter-px given  Cannot take glipizide due to low bs Eye and foot  care discussed         Relevant Orders   Hemoglobin A1c (Completed)     Other   Gout    Lab Results  Component Value Date   LABURIC 7.3 (H) 06/06/2016   No longer on an agent for this  Disc low purine diet  No flares lately  Enc good hydration       Hyperlipidemia LDL goal <100    Intol of statin Now back on zetia Rev last lipid Lipid prof today Disc goals and diet

## 2016-09-04 NOTE — Assessment & Plan Note (Signed)
TSH today  C/o some fatigue

## 2016-09-04 NOTE — Patient Instructions (Addendum)
Take your old meter to the pharmacy  We will write you for a new one in case you need it   Labs today  If blood in stool increases -alert me   Try benadryl 25-50 mg for sleep as needed

## 2016-09-04 NOTE — Assessment & Plan Note (Signed)
Stable  Enc good glucose control

## 2016-09-04 NOTE — Assessment & Plan Note (Signed)
Intol of statin Now back on zetia Rev last lipid Lipid prof today Disc goals and diet

## 2016-09-04 NOTE — Assessment & Plan Note (Signed)
A1C today  Diet is fair  Now on metformin xr 1500 mg  Needs new meter-px given  Cannot take glipizide due to low bs Eye and foot care discussed

## 2016-09-04 NOTE — Assessment & Plan Note (Signed)
Stable  Enc good DM control

## 2016-09-05 ENCOUNTER — Telehealth: Payer: Self-pay | Admitting: *Deleted

## 2016-09-05 DIAGNOSIS — R195 Other fecal abnormalities: Secondary | ICD-10-CM

## 2016-09-05 DIAGNOSIS — D649 Anemia, unspecified: Secondary | ICD-10-CM

## 2016-09-05 MED ORDER — LEVOTHYROXINE SODIUM 50 MCG PO TABS: 50.0000 ug | ORAL_TABLET | Freq: Every day | ORAL | 5 refills | Status: DC

## 2016-09-05 NOTE — Telephone Encounter (Signed)
Ref done  Will route to [facility] 

## 2016-09-05 NOTE — Telephone Encounter (Signed)
Pt is out of town so per [employer] I spoke with spouse who requested I give him lab results. I advise him of lab results and Dr. Marliss Coots instructions.  Rx sent to pharmacy  **Spouse does want a referral for pt to see local GI doctor, pt went to [facility] in the past and wasn't pleased with doctor. Spouse said they are right in the middle of [place] so either city if fine. Please put referral in and I advise spouse our [facility] will call to schedule appt.**  Spouse will have pt call back to schedule the f/u appt but will make sure she starts keeping a log of her BS and also check insurance for covered meds.

## 2016-09-05 NOTE — Telephone Encounter (Signed)
-----   Message from Abner Greenspan, MD sent at 09/04/2016  7:23 PM EDT ----- Mild anemia- may be due to rectal bleeding- she needs to f/u with GI- would she prefer local or [facility]?  TSh s still a little high- I would like to inc her levothyroxine from 25 to 50 mcg daily-please call in 30 with 5 refills and we will check it with next labs  Check glucose with new meter am fasting and 2 hours post prandial  Get a list of covered DM medications from insurance- we will need to add something F/u in 2-3 wk with glucose record and the list and we will make a plan   Also released on mychart

## 2016-09-21 ENCOUNTER — Encounter: Payer: Self-pay | Admitting: Family Medicine

## 2016-09-21 ENCOUNTER — Telehealth: Payer: Self-pay | Admitting: Family Medicine

## 2016-09-21 DIAGNOSIS — E538 Deficiency of other specified B group vitamins: Secondary | ICD-10-CM | POA: Insufficient documentation

## 2016-09-21 HISTORY — DX: Deficiency of other specified B group vitamins: E53.8

## 2016-09-21 NOTE — Telephone Encounter (Signed)
Please re submit with diagnosis of B12 deficiency  (see problem list)  Her B12 was mildly low- I often check it in folks on PPI (like omeprazole) because it can decrease absorption  Also I often check it in pts' with anemia as well  Hers was mildly low - I recommend taking a MVI that has B12 or a B complex vitamin over the counter daily

## 2016-09-21 NOTE — Telephone Encounter (Signed)
Pt called b/c she received a bill for B-12 labs from 03/23/16 DOS. Insurance denied payment, she called billing and was told it was B-12 labs. She doesn't think she had B-12 labs done and is requesting a cb to discuss.

## 2016-09-22 ENCOUNTER — Encounter: Payer: Self-pay | Admitting: Family Medicine

## 2016-09-22 MED ORDER — GLUCOSE BLOOD VI STRP: ORAL_STRIP | 1 refills | Status: DC

## 2016-09-22 NOTE — Telephone Encounter (Signed)
Called pt and verified that we did complete B-12 labs for pt on 03/23/16.  She expressed understanding. lt

## 2016-09-27 ENCOUNTER — Telehealth: Payer: Self-pay | Admitting: Family Medicine

## 2016-09-27 NOTE — Telephone Encounter (Signed)
Form is in your in box

## 2016-09-27 NOTE — Telephone Encounter (Signed)
Done and in IN box 

## 2016-09-27 NOTE — Telephone Encounter (Signed)
Pt dropped off forms to be filled out for DMV. Placed in [place]

## 2016-09-28 NOTE — Telephone Encounter (Signed)
Pt notified letter ready for pick up. 

## 2016-10-03 DIAGNOSIS — H34832 Tributary (branch) retinal vein occlusion, left eye, with macular edema: Secondary | ICD-10-CM | POA: Diagnosis not present

## 2016-10-03 DIAGNOSIS — H35371 Puckering of macula, right eye: Secondary | ICD-10-CM | POA: Diagnosis not present

## 2016-10-03 DIAGNOSIS — H3562 Retinal hemorrhage, left eye: Secondary | ICD-10-CM | POA: Diagnosis not present

## 2016-10-03 DIAGNOSIS — H35352 Cystoid macular degeneration, left eye: Secondary | ICD-10-CM | POA: Diagnosis not present

## 2016-10-11 ENCOUNTER — Other Ambulatory Visit: Payer: Self-pay | Admitting: Family Medicine

## 2016-11-07 ENCOUNTER — Encounter: Payer: Self-pay | Admitting: Gastroenterology

## 2016-11-07 ENCOUNTER — Other Ambulatory Visit
Admission: RE | Admit: 2016-11-07 | Discharge: 2016-11-07 | Disposition: A | Discharge status: Home / Self Care | Payer: Medicare Other | Source: Ambulatory Visit | Attending: Specialist | Admitting: Specialist

## 2016-11-07 ENCOUNTER — Other Ambulatory Visit: Payer: Self-pay

## 2016-11-07 ENCOUNTER — Ambulatory Visit (INDEPENDENT_AMBULATORY_CARE_PROVIDER_SITE_OTHER): Payer: Medicare Other | Admitting: Gastroenterology

## 2016-11-07 VITALS — BP 118/69 | HR 101 | Temp 98.3°F | Ht 63.5 in | Wt 156.6 lb

## 2016-11-07 DIAGNOSIS — K625 Hemorrhage of anus and rectum: Secondary | ICD-10-CM | POA: Diagnosis not present

## 2016-11-07 DIAGNOSIS — R197 Diarrhea, unspecified: Secondary | ICD-10-CM

## 2016-11-07 DIAGNOSIS — R1013 Epigastric pain: Secondary | ICD-10-CM | POA: Diagnosis not present

## 2016-11-07 DIAGNOSIS — G8929 Other chronic pain: Secondary | ICD-10-CM | POA: Diagnosis not present

## 2016-11-07 LAB — IRON AND TIBC
Iron: 58 ug/dL (ref 28–170)
Saturation Ratios: 11 % (ref 10.4–31.8)
TIBC: 508 ug/dL — AB (ref 250–450)
UIBC: 450 ug/dL

## 2016-11-07 LAB — FERRITIN: Ferritin: 29 ng/mL (ref 11–307)

## 2016-11-07 MED ORDER — HYDROCORTISONE ACETATE 25 MG RE SUPP: 25.0000 mg | Freq: Every day | RECTAL | 1 refills | Status: DC

## 2016-11-07 NOTE — Progress Notes (Signed)
Gastroenterology Consultation  Referring Provider:     Abner Greenspan, MD Primary Care Physician:  Tower, Wynelle Fanny, MD Primary Gastroenterologist:  Dr. Allen Norris     Reason for Consultation:     Hematochezia        HPI:   Valerie Ball is a 71 y.o. y/o female referred for consultation & management of Hematochezia by Dr. Glori Bickers, Wynelle Fanny, MD.  This patient comes in today with rectal bleeding.  The patient has also been found to have slight anemia with a hemoglobin of 11.8.  The patient had anemia in the past and has had multiple EGDs and colonoscopies with the only findings being AVMs of the cecum and ascending colon.  The patient was also told that her Endoscopy was normal.  The patient now comes in with bright red blood per rectum and wanted to be seen by a local physician because she did not want to follow up in [facility] anymore and had been seen at [facility] and no longer wanted to be seen there.  She reports that the blood is sometimes only in the water and without having a bowel movement.  The patient also reports that she has abdominal pain that she thinks may have been due to her hiatal hernia repair.  The patient states that she was told that her hiatal hernia repair had failed the patient reports that spicy foods and greasy foods will make her pain worse.  She also takes omeprazole 20 mg twice a day and states that it helps her symptoms but not completely.  The patient also takes a homeopathic acid suppressor that she also reports helps her symptoms.  Past Medical History:  Diagnosis Date  . Allergy    allegic rhinitis  . Anemia   . Angiodysplasia of colon   . Anxiety   . Arthritis   . Blood transfusion   . Borderline diabetic    per patient medical history form  . Breast cancer ([place]) 02/2009   breast CA L invasive ductal CA(hormone receptor positive.)  . Cataract   . Colon polyp    hyperplastic  . Colon stricture ([place]) 01/14/2003  . Diabetes mellitus    pt's medical doctor took  her off diabetic meds-blood sugars have not been a problem--and medications were causing pt's sugars to be too low  . Diverticulosis   . Esophageal stricture 02/04/2008  . GERD (gastroesophageal reflux disease)   . GI bleed   . Hepatic steatosis   . Hiatal hernia   . Hyperlipidemia   . Hypertension   . Hypothyroidism   . IBS (irritable bowel syndrome)   . Melena 01/2008   anemia transfusion  . Menopause    per patient medical history form  . Osteoporosis   . Postmenopausal hormone therapy    per patient medical history form.  Marland Kitchen RLS (restless legs syndrome)   . Salmonella 05/2000   renal effects secondary to dehydration  . Seizure disorder ([place])   . Seizures ([place])    one seizure several yrs ago --etiology unknown-no problem since    Past Surgical History:  Procedure Laterality Date  . ABDOMINAL HYSTERECTOMY  1983  . BREAST SURGERY  02/2009   breast biopsy invasive ductal CA-bilateral mastectomies  . EPIGASTRIC HERNIA REPAIR  01/02/2012   Procedure: HERNIA REPAIR EPIGASTRIC ADULT;  Surgeon: Pedro Earls, MD;  Location: [facility];  Service: General;  Laterality: N/A;  Laparoscopic Repair Paraesophageal Hernia, Nissen  . LAPAROSCOPIC NISSEN FUNDOPLICATION  09/17/1599  Procedure: LAPAROSCOPIC NISSEN FUNDOPLICATION;  Surgeon: Pedro Earls, MD;  Location: [facility];  Service: General;  Laterality: N/A;  . MASTECTOMY  04/2009   Bilateral for ductal carcinoma on L  . OVARY SURGERY  1986   ovarian tumor removed    Prior to Admission medications   Medication Sig Start Date End Date Taking? Authorizing Provider  amLODipine (NORVASC) 5 MG tablet TAKE ONE TABLET BY MOUTH ONCE DAILY 07/04/16  Yes Tower, Wynelle Fanny, MD  ezetimibe (ZETIA) 10 MG tablet Take 1 tablet (10 mg total) by mouth daily. 06/09/16  Yes Tower, Wynelle Fanny, MD  fluticasone (FLONASE) 50 MCG/ACT nasal spray Place 2 sprays into both nostrils daily as needed. Allergies 06/09/16  Yes Tower, Roque Lias A, MD  glucose blood (FREESTYLE  LITE) test strip Use to check blood sugar once daily and as needed (diagnosis E11.65) 09/22/16  Yes Tower, Wynelle Fanny, MD  hydrochlorothiazide (HYDRODIURIL) 25 MG tablet Take 1 tablet (25 mg total) by mouth daily. 03/27/16  Yes Tower, Wynelle Fanny, MD  levothyroxine (SYNTHROID, LEVOTHROID) 50 MCG tablet Take 1 tablet (50 mcg total) by mouth daily before breakfast. 09/05/16  Yes Tower, Wynelle Fanny, MD  losartan (COZAAR) 100 MG tablet TAKE ONE TABLET BY MOUTH ONCE DAILY 07/04/16  Yes Tower, Wynelle Fanny, MD  metFORMIN (GLUCOPHAGE-XR) 500 MG 24 hr tablet Take 3 tablets (1,500 mg total) by mouth daily with breakfast. 06/09/16  Yes Tower, Wynelle Fanny, MD  Multiple Vitamin (MULTIVITAMIN WITH MINERALS) TABS tablet Take 1 tablet by mouth daily.   Yes [provider]  omeprazole (PRILOSEC) 20 MG capsule TAKE ONE CAPSULE BY MOUTH TWICE DAILY BEFORE MEAL(S) 06/27/16  Yes Tower, Wynelle Fanny, MD  rOPINIRole (REQUIP) 1 MG tablet TAKE ONE TABLET BY MOUTH AT BEDTIME 05/25/16  Yes Tower, Marne A, MD  sertraline (ZOLOFT) 100 MG tablet TAKE ONE TABLET BY MOUTH AT BEDTIME 10/11/16  Yes Tower, Marne A, MD  aspirin EC 81 MG tablet Take 81 mg by mouth daily.    [provider]  hydrocortisone (ANUSOL-HC) 25 MG suppository Place 1 suppository (25 mg total) rectally at bedtime. 11/07/16   Lucilla Lame, MD    Family History  Problem Relation Age of Onset  . Heart attack Father   . Diabetes Father   . Esophageal cancer Father   . Breast cancer Sister        x 2 sisters  . Diabetes Sister   . Hypertension Brother   . Diabetes Brother   . Hypertension Mother   . Stroke Mother   . Breast cancer Mother   . Hypertension Sister      Social History  Substance Use Topics  . Smoking status: Never Smoker  . Smokeless tobacco: Never Used  . Alcohol use No    Allergies as of 11/07/2016 - Review Complete 11/07/2016  Allergen Reaction Noted  . Arimidex [anastrozole]  10/25/2011  . Glipizide Other (See Comments) 06/01/2014  . Metformin  and related  10/25/2011  . Penicillins Rash 05/11/2006    Review of Systems:    All systems reviewed and negative except where noted in HPI.   Physical Exam:  BP 118/69   Pulse (!) 101   Temp 98.3 F (36.8 C) (Oral)   Ht 5' 3.5" (1.613 m)   Wt 156 lb 9.6 oz (71 kg)   LMP 03/20/1981   BMI 27.31 kg/m  Patient's last menstrual period was 03/20/1981. Psych:  Alert and cooperative. Normal mood and affect. General:   Alert,  Well-developed, well-nourished, pleasant and cooperative in NAD Head:  Normocephalic and atraumatic. Eyes:  Sclera clear, no icterus.   Conjunctiva pink. Ears:  Normal auditory acuity. Nose:  No deformity, discharge, or lesions. Mouth:  No deformity or lesions,oropharynx pink & moist. Neck:  Supple; no masses or thyromegaly. Lungs:  Respirations even and unlabored.  Clear throughout to auscultation.   No wheezes, crackles, or rhonchi. No acute distress. Heart:  Regular rate and rhythm; no murmurs, clicks, rubs, or gallops. Abdomen:  Normal bowel sounds.  No bruits.  Soft, Tender in the epigastric area and non-distended without masses, hepatosplenomegaly or hernias noted.  No guarding or rebound tenderness.  Negative Carnett sign.   Rectal:  Deferred.  Msk:  Symmetrical without gross deformities.  Good, equal movement & strength bilaterally. Pulses:  Normal pulses noted. Extremities:  No clubbing or edema.  No cyanosis. Neurologic:  Alert and oriented x3;  grossly normal neurologically. Skin:  Intact without significant lesions or rashes.  No jaundice. Lymph Nodes:  No significant cervical adenopathy. Psych:  Alert and cooperative. Normal mood and affect.  Imaging Studies: No results found.  Assessment and Plan:   Valerie Ball is a 71 y.o. y/o female who has epigastric tenderness and rectal bleeding.  The patient will have her blood centile for H. Pylori.  The patient will also stop the omeprazole and start Dexilant 60 mg a day. The patient will also be  started on Anusol suppositories for hemorrhoids.  The patient has been told that if her suppositories don't work she may need a referral to a Psychologist, sport and exercise.  The patient also be set up for an upper endoscopy if the Dexilant does not improve her symptoms and if the H. Pylori is negative.  If the H. Pylori is positive we will treat her for H. Pylori and see if her symptoms improve. I will also send off iron studies due to the slight anemia. The patient has been explained the plan and agrees with it  Lucilla Lame, MD. Marval Regal   Note: This dictation was prepared with Dragon dictation along with smaller phrase technology. Any transcriptional errors that result from this process are unintentional.

## 2016-11-08 LAB — H. PYLORI ANTIBODY, IGG: H Pylori IgG: <0.8 {index_val} (ref 0.00–0.79)

## 2016-11-13 ENCOUNTER — Telehealth: Payer: Self-pay

## 2016-11-13 NOTE — Telephone Encounter (Signed)
LVM for pt to return my call.

## 2016-11-13 NOTE — Telephone Encounter (Signed)
-----   Message from Lucilla Lame, MD sent at 11/09/2016  7:46 AM EDT ----- Let the patient know the h. Pylori was negative.

## 2016-11-13 NOTE — Telephone Encounter (Signed)
-----   Message from Lucilla Lame, MD sent at 11/07/2016  6:27 PM EDT ----- That the patient know that her iron is borderline low

## 2016-11-16 NOTE — Telephone Encounter (Signed)
Patient is returning your call and understands you are out until Tuesday

## 2016-11-23 NOTE — Telephone Encounter (Signed)
Pt notified of results. Pt stated she has iron she should be taking but was not taking it. She will restart.

## 2016-11-28 ENCOUNTER — Encounter: Payer: Self-pay | Admitting: Gastroenterology

## 2016-11-28 DIAGNOSIS — H3582 Retinal ischemia: Secondary | ICD-10-CM | POA: Diagnosis not present

## 2016-11-28 DIAGNOSIS — H34832 Tributary (branch) retinal vein occlusion, left eye, with macular edema: Secondary | ICD-10-CM | POA: Diagnosis not present

## 2016-11-28 DIAGNOSIS — H3562 Retinal hemorrhage, left eye: Secondary | ICD-10-CM | POA: Diagnosis not present

## 2016-11-28 DIAGNOSIS — E113393 Type 2 diabetes mellitus with moderate nonproliferative diabetic retinopathy without macular edema, bilateral: Secondary | ICD-10-CM | POA: Diagnosis not present

## 2016-11-29 ENCOUNTER — Other Ambulatory Visit: Payer: Self-pay

## 2016-11-29 MED ORDER — DEXLANSOPRAZOLE 60 MG PO CPDR: 60.0000 mg | DELAYED_RELEASE_CAPSULE | Freq: Every day | ORAL | 6 refills | Status: DC

## 2016-12-26 DIAGNOSIS — H34832 Tributary (branch) retinal vein occlusion, left eye, with macular edema: Secondary | ICD-10-CM | POA: Diagnosis not present

## 2016-12-26 DIAGNOSIS — H35032 Hypertensive retinopathy, left eye: Secondary | ICD-10-CM | POA: Diagnosis not present

## 2016-12-26 DIAGNOSIS — E113393 Type 2 diabetes mellitus with moderate nonproliferative diabetic retinopathy without macular edema, bilateral: Secondary | ICD-10-CM | POA: Diagnosis not present

## 2016-12-26 DIAGNOSIS — H35352 Cystoid macular degeneration, left eye: Secondary | ICD-10-CM | POA: Diagnosis not present

## 2016-12-26 DIAGNOSIS — H3582 Retinal ischemia: Secondary | ICD-10-CM | POA: Diagnosis not present

## 2017-01-09 ENCOUNTER — Encounter: Payer: Self-pay | Admitting: *Deleted

## 2017-01-23 DIAGNOSIS — H3582 Retinal ischemia: Secondary | ICD-10-CM | POA: Diagnosis not present

## 2017-01-23 DIAGNOSIS — H34832 Tributary (branch) retinal vein occlusion, left eye, with macular edema: Secondary | ICD-10-CM | POA: Diagnosis not present

## 2017-01-23 DIAGNOSIS — H35032 Hypertensive retinopathy, left eye: Secondary | ICD-10-CM | POA: Diagnosis not present

## 2017-01-23 DIAGNOSIS — E113393 Type 2 diabetes mellitus with moderate nonproliferative diabetic retinopathy without macular edema, bilateral: Secondary | ICD-10-CM | POA: Diagnosis not present

## 2017-03-08 DIAGNOSIS — H35032 Hypertensive retinopathy, left eye: Secondary | ICD-10-CM | POA: Diagnosis not present

## 2017-03-08 DIAGNOSIS — H3582 Retinal ischemia: Secondary | ICD-10-CM | POA: Diagnosis not present

## 2017-03-08 DIAGNOSIS — H35352 Cystoid macular degeneration, left eye: Secondary | ICD-10-CM | POA: Diagnosis not present

## 2017-03-08 DIAGNOSIS — H34832 Tributary (branch) retinal vein occlusion, left eye, with macular edema: Secondary | ICD-10-CM | POA: Diagnosis not present

## 2017-03-29 ENCOUNTER — Ambulatory Visit (INDEPENDENT_AMBULATORY_CARE_PROVIDER_SITE_OTHER): Payer: Medicare Other

## 2017-03-29 DIAGNOSIS — Z23 Encounter for immunization: Secondary | ICD-10-CM

## 2017-04-09 ENCOUNTER — Other Ambulatory Visit: Payer: Self-pay | Admitting: Family Medicine

## 2017-04-25 ENCOUNTER — Other Ambulatory Visit: Payer: Self-pay | Admitting: Family Medicine

## 2017-04-25 DIAGNOSIS — H35032 Hypertensive retinopathy, left eye: Secondary | ICD-10-CM | POA: Diagnosis not present

## 2017-04-25 DIAGNOSIS — H34832 Tributary (branch) retinal vein occlusion, left eye, with macular edema: Secondary | ICD-10-CM | POA: Diagnosis not present

## 2017-04-25 DIAGNOSIS — H35352 Cystoid macular degeneration, left eye: Secondary | ICD-10-CM | POA: Diagnosis not present

## 2017-04-25 DIAGNOSIS — E113393 Type 2 diabetes mellitus with moderate nonproliferative diabetic retinopathy without macular edema, bilateral: Secondary | ICD-10-CM | POA: Diagnosis not present

## 2017-04-25 DIAGNOSIS — H3582 Retinal ischemia: Secondary | ICD-10-CM | POA: Diagnosis not present

## 2017-05-08 ENCOUNTER — Encounter: Payer: Self-pay | Admitting: Internal Medicine

## 2017-05-08 ENCOUNTER — Ambulatory Visit (INDEPENDENT_AMBULATORY_CARE_PROVIDER_SITE_OTHER): Payer: Medicare Other | Admitting: Internal Medicine

## 2017-05-08 VITALS — BP 120/78 | HR 84 | Temp 98.2°F | Wt 159.0 lb

## 2017-05-08 DIAGNOSIS — R42 Dizziness and giddiness: Secondary | ICD-10-CM | POA: Diagnosis not present

## 2017-05-08 DIAGNOSIS — H8110 Benign paroxysmal vertigo, unspecified ear: Secondary | ICD-10-CM

## 2017-05-08 DIAGNOSIS — E039 Hypothyroidism, unspecified: Secondary | ICD-10-CM

## 2017-05-08 DIAGNOSIS — E118 Type 2 diabetes mellitus with unspecified complications: Secondary | ICD-10-CM

## 2017-05-08 LAB — COMPREHENSIVE METABOLIC PANEL
ALBUMIN: 4.1 g/dL (ref 3.5–5.2)
ALT: 28 U/L (ref 0–35)
AST: 41 U/L — ABNORMAL HIGH (ref 0–37)
Alkaline Phosphatase: 70 U/L (ref 39–117)
BUN: 19 mg/dL (ref 6–23)
CHLORIDE: 99 meq/L (ref 96–112)
CO2: 32 mEq/L (ref 19–32)
Calcium: 9.7 mg/dL (ref 8.4–10.5)
Creatinine, Ser: 0.83 mg/dL (ref 0.40–1.20)
GFR: 72.29 mL/min (ref >60.00)
Glucose, Bld: 182 mg/dL — ABNORMAL HIGH (ref 70–99)
POTASSIUM: 4.3 meq/L (ref 3.5–5.1)
Sodium: 138 mEq/L (ref 135–145)
Total Bilirubin: 0.5 mg/dL (ref 0.2–1.2)
Total Protein: 7.8 g/dL (ref 6.0–8.3)

## 2017-05-08 LAB — CBC
HEMATOCRIT: 36.1 % (ref 36.0–46.0)
HEMOGLOBIN: 11.9 g/dL — AB (ref 12.0–15.0)
MCHC: 32.8 g/dL (ref 30.0–36.0)
MCV: 82.2 fl (ref 78.0–100.0)
PLATELETS: 110 10*3/uL — AB (ref 150.0–400.0)
RBC: 4.39 Mil/uL (ref 3.87–5.11)
RDW: 16.1 % — ABNORMAL HIGH (ref 11.5–15.5)
WBC: 4.6 10*3/uL (ref 4.0–10.5)

## 2017-05-08 LAB — TSH: TSH: 4.11 u[IU]/mL (ref 0.35–4.50)

## 2017-05-08 LAB — T4, FREE: Free T4: 0.99 ng/dL (ref 0.60–1.60)

## 2017-05-08 LAB — HEMOGLOBIN A1C: Hgb A1c MFr Bld: 8.4 % — ABNORMAL HIGH (ref 4.6–6.5)

## 2017-05-08 MED ORDER — MECLIZINE HCL 25 MG PO TABS: 25.0000 mg | ORAL_TABLET | Freq: Three times a day (TID) | ORAL | 0 refills | Status: DC | PRN

## 2017-05-08 NOTE — Progress Notes (Signed)
Subjective:    Patient ID: Valerie Ball, female    DOB: 27-Feb-1946, 72 y.o.   MRN: [id number]  HPI  Pt presents to the clinic today with c/o dizziness. She reports this started 2 days ago. She noticed it when she was walking, and was veering off to the left. She reports this is more like an imbalance, not like the room is spinning. It is worse with position changes and head movement. She denies runny nose, nasal congestion or ear pain. She does have blurred vision in her left eye, but reports she is having surgery next month to remove a blood clot in her retina. She denies slurred speech, difficulty swallowing, weakness or near syncope. She denies chest pain or shortness of breath. She has not tried anything OTC for her symptoms. She denies changes in diet or medications. No one around her has been sick recently.  Review of Systems  Past Medical History:  Diagnosis Date  . Allergy    allegic rhinitis  . Anemia   . Angiodysplasia of colon   . Anxiety   . Arthritis   . Blood transfusion   . Borderline diabetic    per patient medical history form  . Breast cancer ([place]) 02/2009   breast CA L invasive ductal CA(hormone receptor positive.)  . Cataract   . Colon polyp    hyperplastic  . Colon stricture ([place]) 01/14/2003  . Diabetes mellitus    pt's medical doctor took her off diabetic meds-blood sugars have not been a problem--and medications were causing pt's sugars to be too low  . Diverticulosis   . Esophageal stricture 02/04/2008  . GERD (gastroesophageal reflux disease)   . GI bleed   . Hepatic steatosis   . Hiatal hernia   . Hyperlipidemia   . Hypertension   . Hypothyroidism   . IBS (irritable bowel syndrome)   . Melena 01/2008   anemia transfusion  . Menopause    per patient medical history form  . Osteoporosis   . Postmenopausal hormone therapy    per patient medical history form.  Marland Kitchen RLS (restless legs syndrome)   . Salmonella 05/2000   renal effects secondary to  dehydration  . Seizure disorder ([place])   . Seizures ([place])    one seizure several yrs ago --etiology unknown-no problem since    Current Outpatient Medications  Medication Sig Dispense Refill  . amLODipine (NORVASC) 5 MG tablet TAKE 1 TABLET BY MOUTH ONCE DAILY 90 tablet 1  . aspirin EC 81 MG tablet Take 81 mg by mouth daily.    Marland Kitchen dexlansoprazole (DEXILANT) 60 MG capsule Take 1 capsule (60 mg total) by mouth daily. 30 capsule 6  . ezetimibe (ZETIA) 10 MG tablet Take 1 tablet (10 mg total) by mouth daily. 30 tablet 11  . fluticasone (FLONASE) 50 MCG/ACT nasal spray Place 2 sprays into both nostrils daily as needed. Allergies 48 g 3  . glucose blood (FREESTYLE LITE) test strip Use to check blood sugar once daily and as needed (diagnosis E11.65) 100 each 1  . hydrochlorothiazide (HYDRODIURIL) 25 MG tablet TAKE ONE TABLET BY MOUTH ONCE DAILY 90 tablet 1  . hydrocortisone (ANUSOL-HC) 25 MG suppository Place 1 suppository (25 mg total) rectally at bedtime. 24 suppository 1  . levothyroxine (SYNTHROID, LEVOTHROID) 50 MCG tablet Take 1 tablet (50 mcg total) by mouth daily before breakfast. 30 tablet 5  . losartan (COZAAR) 100 MG tablet TAKE 1 TABLET BY MOUTH ONCE DAILY 90 tablet 1  .  metFORMIN (GLUCOPHAGE-XR) 500 MG 24 hr tablet Take 3 tablets (1,500 mg total) by mouth daily with breakfast. 90 tablet 11  . Multiple Vitamin (MULTIVITAMIN WITH MINERALS) TABS tablet Take 1 tablet by mouth daily.    Marland Kitchen omeprazole (PRILOSEC) 20 MG capsule TAKE ONE CAPSULE BY MOUTH TWICE DAILY BEFORE MEAL(S) 180 capsule 2  . rOPINIRole (REQUIP) 1 MG tablet TAKE ONE TABLET BY MOUTH AT BEDTIME 90 tablet 3  . sertraline (ZOLOFT) 100 MG tablet TAKE 1 TABLET BY MOUTH AT BEDTIME 90 tablet 1   No current facility-administered medications for this visit.     Allergies  Allergen Reactions  . Arimidex [Anastrozole]     Joint pain  . Glipizide Other (See Comments)    Hypoglycemia   . Metformin And Related     diarrhea  .  Penicillins Rash    Family History  Problem Relation Age of Onset  . Heart attack Father   . Diabetes Father   . Esophageal cancer Father   . Breast cancer Sister        x 2 sisters  . Diabetes Sister   . Hypertension Brother   . Diabetes Brother   . Hypertension Mother   . Stroke Mother   . Breast cancer Mother   . Hypertension Sister     Social History   Socioeconomic History  . Marital status: Married    Spouse name: Not on file  . Number of children: 3  . Years of education: Not on file  . Highest education level: Not on file  Social Needs  . Financial resource strain: Not on file  . Food insecurity - worry: Not on file  . Food insecurity - inability: Not on file  . Transportation needs - medical: Not on file  . Transportation needs - non-medical: Not on file  Occupational History  . Occupation: retired    Fish farm manager: UNEMPLOYED  Tobacco Use  . Smoking status: Never Smoker  . Smokeless tobacco: Never Used  Substance and Sexual Activity  . Alcohol use: No    Alcohol/week: 0.0 oz  . Drug use: No  . Sexual activity: Not Currently  Other Topics Concern  . Not on file  Social History Narrative   1 cup of coffee every morning     Constitutional: Denies fever, malaise, fatigue, headache or abrupt weight changes.  HEENT: Denies eye pain, eye redness, ear pain, ringing in the ears, wax buildup, runny nose, nasal congestion, bloody nose, or sore throat. Respiratory: Denies difficulty breathing, shortness of breath, cough or sputum production.   Cardiovascular: Denies chest pain, chest tightness, palpitations or swelling in the hands or feet.  Gastrointestinal: Denies abdominal pain, bloating, constipation, diarrhea or blood in the stool.  Neurological: Pt reports dizziness. Denies difficulty with memory, difficulty with speech or problems with balance and coordination.    No other specific complaints in a complete review of systems (except as listed in HPI  above).     Objective:   Physical Exam   BP 120/78   Pulse 84   Temp 98.2 F (36.8 C) (Oral)   Wt 159 lb (72.1 kg)   LMP 03/20/1981   SpO2 97%   BMI 27.72 kg/m  Wt Readings from Last 3 Encounters:  05/08/17 159 lb (72.1 kg)  11/07/16 156 lb 9.6 oz (71 kg)  09/04/16 159 lb (72.1 kg)    General: Appears her stated age, in NAD. HEENT: Head: normal shape and size; Eyes: PERRLA and EOMs intact; Ears: Tm's  gray and intact, normal light reflex, positive horizontal nystagmus.  Cardiovascular: Normal rate and rhythm. S1,S2 noted.  Murmur noted. Pulmonary/Chest: Normal effort and positive vesicular breath sounds. No respiratory distress. No wheezes, rales or ronchi noted.  Neurological: Alert and oriented. Coordination normal but she is very wobbly when trying to walk a straight line. Negative pronator drift.   BMET    Component Value Date/Time   NA 139 09/04/2016 1114   NA 143 05/28/2014 1114   K 3.8 09/04/2016 1114   K 4.1 05/28/2014 1114   CL 103 09/04/2016 1114   CL 103 08/21/2012 1144   CO2 25 09/04/2016 1114   CO2 22 05/28/2014 1114   GLUCOSE 159 (H) 09/04/2016 1114   GLUCOSE 228 (H) 05/28/2014 1114   GLUCOSE 118 (H) 08/21/2012 1144   BUN 18 09/04/2016 1114   BUN 19.6 05/28/2014 1114   CREATININE 0.77 09/04/2016 1114   CREATININE 0.77 11/12/2015 1629   CREATININE 1.0 05/28/2014 1114   CALCIUM 9.5 09/04/2016 1114   CALCIUM 9.0 05/28/2014 1114   GFRNONAA >60 06/30/2015 2145   GFRAA >60 06/30/2015 2145    Lipid Panel     Component Value Date/Time   CHOL 149 09/04/2016 1126   TRIG 358.0 (H) 09/04/2016 1126   HDL 29.20 (L) 09/04/2016 1126   CHOLHDL 5 09/04/2016 1126   VLDL 71.6 (H) 09/04/2016 1126    CBC    Component Value Date/Time   WBC 5.4 09/04/2016 1126   RBC 4.32 09/04/2016 1126   HGB 11.8 (L) 09/04/2016 1126   HGB 12.1 05/28/2014 1114   HCT 35.4 (L) 09/04/2016 1126   HCT 38.4 05/28/2014 1114   PLT 121.0 (L) 09/04/2016 1126   PLT 111 (L)  05/28/2014 1114   MCV 82.0 09/04/2016 1126   MCV 86.0 05/28/2014 1114   MCH 27.3 06/30/2015 2145   MCHC 33.3 09/04/2016 1126   RDW 15.7 (H) 09/04/2016 1126   RDW 15.3 (H) 05/28/2014 1114   LYMPHSABS 1.5 09/04/2016 1126   LYMPHSABS 1.7 05/28/2014 1114   MONOABS 0.4 09/04/2016 1126   MONOABS 0.4 05/28/2014 1114   EOSABS 0.0 09/04/2016 1126   EOSABS 0.1 05/28/2014 1114   EOSABS 0.1 07/15/2009 1448   BASOSABS 0.0 09/04/2016 1126   BASOSABS 0.0 05/28/2014 1114    Hgb A1C Lab Results  Component Value Date   HGBA1C 8.3 (H) 09/04/2016           Assessment & Plan:   Dizziness, Likely BPPV;  Orthostatics negative Will check CBC, CMET, TSH, Free T4 and A1C today Encouraged her to increase her water intake Info given on the [facility] eRx for Meclizine 25 mg TID prn- sedation caution given  Return precautions discussed, will follow up after labs Webb Silversmith, NP

## 2017-05-08 NOTE — Patient Instructions (Signed)
Dizziness °Dizziness is a common problem. It makes you feel unsteady or light-headed. You may feel like you are about to pass out (faint). Dizziness can lead to getting hurt if you stumble or fall. Dizziness can be caused by many things, including: °· Medicines. °· Not having enough water in your body (dehydration). °· Illness. ° °Follow these instructions at home: °Eating and drinking °· Drink enough fluid to keep your pee (urine) clear or pale yellow. This helps to keep you from getting dehydrated. Try to drink more clear fluids, such as water. °· Do not drink alcohol. °· Limit how much caffeine you drink or eat, if your doctor tells you to do that. °· Limit how much salt (sodium) you drink or eat, if your doctor tells you to do that. °Activity °· Avoid making quick movements. °? When you stand up from sitting in a chair, steady yourself until you feel okay. °? In the morning, first sit up on the side of the bed. When you feel okay, stand slowly while you hold onto something. Do this until you know that your balance is fine. °· If you need to stand in one place for a long time, move your legs often. Tighten and relax the muscles in your legs while you are standing. °· Do not drive or use heavy machinery if you feel dizzy. °· Avoid bending down if you feel dizzy. Place items in your home so you can reach them easily without leaning over. °Lifestyle °· Do not use any products that contain nicotine or tobacco, such as cigarettes and e-cigarettes. If you need help quitting, ask your doctor. °· Try to lower your stress level. You can do this by using methods such as yoga or meditation. Talk with your doctor if you need help. °General instructions °· Watch your dizziness for any changes. °· Take over-the-counter and prescription medicines only as told by your doctor. Talk with your doctor if you think that you are dizzy because of a medicine that you are taking. °· Tell a friend or a family member that you are feeling  dizzy. If he or she notices any changes in your behavior, have this person call your doctor. °· Keep all follow-up visits as told by your doctor. This is important. °Contact a doctor if: °· Your dizziness does not go away. °· Your dizziness or light-headedness gets worse. °· You feel sick to your stomach (nauseous). °· You have trouble hearing. °· You have new symptoms. °· You are unsteady on your feet. °· You feel like the room is spinning. °Get help right away if: °· You throw up (vomit) or have watery poop (diarrhea), and you cannot eat or drink anything. °· You have trouble: °? Talking. °? Walking. °? Swallowing. °? Using your arms, hands, or legs. °· You feel generally weak. °· You are not thinking clearly, or you have trouble forming sentences. A friend or family member may notice this. °· You have: °? Chest pain. °? Pain in your belly (abdomen). °? Shortness of breath. °? Sweating. °· Your vision changes. °· You are bleeding. °· You have a very bad headache. °· You have neck pain or a stiff neck. °· You have a fever. °These symptoms may be an emergency. Do not wait to see if the symptoms will go away. Get medical help right away. Call your local emergency services (911 in the [place] Do not drive yourself to the hospital. °Summary °· Dizziness makes you feel unsteady or light-headed. You may   feel like you are about to pass out (faint). °· Drink enough fluid to keep your pee (urine) clear or pale yellow. Do not drink alcohol. °· Avoid making quick movements if you feel dizzy. °· Watch your dizziness for any changes. °This information is not intended to replace advice given to you by your health care provider. Make sure you discuss any questions you have with your health care provider. °Document Released: 02/23/2011 Document Revised: 03/23/2016 Document Reviewed: 03/23/2016 °[employer]vier Interactive Patient Education © 2017 [employer] ° °

## 2017-06-04 ENCOUNTER — Other Ambulatory Visit: Payer: Self-pay | Admitting: Family Medicine

## 2017-06-05 DIAGNOSIS — H34832 Tributary (branch) retinal vein occlusion, left eye, with macular edema: Secondary | ICD-10-CM | POA: Diagnosis not present

## 2017-06-11 ENCOUNTER — Encounter: Payer: Medicare Other | Admitting: Family Medicine

## 2017-06-18 ENCOUNTER — Other Ambulatory Visit: Payer: Self-pay | Admitting: Family Medicine

## 2017-06-18 NOTE — Telephone Encounter (Signed)
CPE scheduled for 08/31/17, last filled on 05/25/16 #90 tabs with 3 additional refills

## 2017-06-18 NOTE — Telephone Encounter (Signed)
done

## 2017-06-18 NOTE — Telephone Encounter (Signed)
Please refill until seen

## 2017-06-19 DIAGNOSIS — H34832 Tributary (branch) retinal vein occlusion, left eye, with macular edema: Secondary | ICD-10-CM | POA: Diagnosis not present

## 2017-06-19 DIAGNOSIS — Z09 Encounter for follow-up examination after completed treatment for conditions other than malignant neoplasm: Secondary | ICD-10-CM | POA: Diagnosis not present

## 2017-07-18 HISTORY — PX: EYE SURGERY: SHX253

## 2017-07-23 ENCOUNTER — Other Ambulatory Visit: Payer: Self-pay | Admitting: Family Medicine

## 2017-08-23 ENCOUNTER — Encounter: Payer: Medicare Other | Admitting: Adult Health

## 2017-08-24 ENCOUNTER — Encounter: Payer: Medicare Other | Admitting: Family Medicine

## 2017-08-26 ENCOUNTER — Encounter: Payer: Self-pay | Admitting: Family Medicine

## 2017-08-27 ENCOUNTER — Encounter: Payer: Self-pay | Admitting: Adult Health

## 2017-08-27 ENCOUNTER — Ambulatory Visit (HOSPITAL_COMMUNITY)
Admission: RE | Admit: 2017-08-27 | Discharge: 2017-08-27 | Disposition: A | Discharge status: Home / Self Care | Payer: Medicare Other | Source: Ambulatory Visit | Attending: Adult Health | Admitting: Adult Health

## 2017-08-27 ENCOUNTER — Inpatient Hospital Stay: Payer: Medicare Other | Attending: Adult Health | Admitting: Adult Health

## 2017-08-27 VITALS — BP 126/79 | HR 95 | Temp 98.7°F | Resp 18 | Ht 63.5 in | Wt 156.0 lb

## 2017-08-27 DIAGNOSIS — Z853 Personal history of malignant neoplasm of breast: Secondary | ICD-10-CM | POA: Diagnosis not present

## 2017-08-27 DIAGNOSIS — R0789 Other chest pain: Secondary | ICD-10-CM | POA: Insufficient documentation

## 2017-08-27 DIAGNOSIS — C50911 Malignant neoplasm of unspecified site of right female breast: Secondary | ICD-10-CM

## 2017-08-27 DIAGNOSIS — Z9010 Acquired absence of unspecified breast and nipple: Secondary | ICD-10-CM | POA: Insufficient documentation

## 2017-08-27 DIAGNOSIS — I700 Atherosclerosis of aorta: Secondary | ICD-10-CM | POA: Diagnosis not present

## 2017-08-27 DIAGNOSIS — Z9013 Acquired absence of bilateral breasts and nipples: Secondary | ICD-10-CM | POA: Insufficient documentation

## 2017-08-27 DIAGNOSIS — R079 Chest pain, unspecified: Secondary | ICD-10-CM | POA: Diagnosis not present

## 2017-08-27 NOTE — Progress Notes (Signed)
CLINIC:  Survivorship   REASON FOR VISIT:  Routine follow-up for history of breast cancer.   BRIEF ONCOLOGIC HISTORY:    Breast cancer, right breast ([place])   02/17/2009 Initial Diagnosis    Left breast invasive ductal carcinoma 9:00 position      05/11/2009 Surgery    Right breast mastectomy fibrocystic changes: Left breast mastectomy: multifocal IDC and DCIS - 1.2 and 0.7 cm; 0/5 LN: Grade 1, ER 100%, PR 100%, Ki-67 36% and 19%; HER-2 negative (ratio 1 and 1.13)      05/11/2009 Pathologic Stage    Stage IA: T1c N0      06/25/2009 - 06/2014 Anti-estrogen oral therapy    Arimidex 1 mg daily discontinued due to aches and pains changed to tamoxifen 20 mg daily        INTERVAL HISTORY:  Ms. Rappleye presents to the [facility] today for routine follow-up for her history of breast cancer.  Overall, she reports feeling quite well. She has noted some left upper outer quadrant chest wall pain that has been progressively worsening over the past 6 months.    She sees her PCP regularly, she has note yet undergone skin cancer screning for the year.  S    REVIEW OF SYSTEMS:  Review of Systems  Constitutional: Negative for appetite change, chills, fatigue, fever and unexpected weight change.  HENT:   Negative for hearing loss, lump/mass and trouble swallowing.   Eyes: Negative for eye problems and icterus.  Respiratory: Negative for chest tightness, cough and shortness of breath.   Cardiovascular: Negative for chest pain, leg swelling and palpitations.  Gastrointestinal: Negative for abdominal distention, constipation, diarrhea, nausea and vomiting.  Endocrine: Negative for hot flashes.  Skin: Negative for itching and rash.  Neurological: Negative for dizziness, extremity weakness, headaches and numbness.  Hematological: Negative for adenopathy. Does not bruise/bleed easily.  Psychiatric/Behavioral: Negative for depression. The patient is not nervous/anxious.   Breast: Denies any  new nodularity, masses, tenderness, nipple changes, or nipple discharge.       PAST MEDICAL/SURGICAL HISTORY:  Past Medical History:  Diagnosis Date  . Allergy    allegic rhinitis  . Anemia   . Angiodysplasia of colon   . Anxiety   . Arthritis   . Blood transfusion   . Borderline diabetic    per patient medical history form  . Breast cancer ([place]) 02/2009   breast CA L invasive ductal CA(hormone receptor positive.)  . Cataract   . Colon polyp    hyperplastic  . Colon stricture ([place]) 01/14/2003  . Diabetes mellitus    pt's medical doctor took her off diabetic meds-blood sugars have not been a problem--and medications were causing pt's sugars to be too low  . Diverticulosis   . Esophageal stricture 02/04/2008  . GERD (gastroesophageal reflux disease)   . GI bleed   . Hepatic steatosis   . Hiatal hernia   . Hyperlipidemia   . Hypertension   . Hypothyroidism   . IBS (irritable bowel syndrome)   . Melena 01/2008   anemia transfusion  . Menopause    per patient medical history form  . Osteoporosis   . Postmenopausal hormone therapy    per patient medical history form.  Marland Kitchen RLS (restless legs syndrome)   . Salmonella 05/2000   renal effects secondary to dehydration  . Seizure disorder ([place])   . Seizures ([place])    one seizure several yrs ago --etiology unknown-no problem since   Past Surgical History:  Procedure Laterality Date  . ABDOMINAL HYSTERECTOMY  1983  . BREAST SURGERY  02/2009   breast biopsy invasive ductal CA-bilateral mastectomies  . EPIGASTRIC HERNIA REPAIR  01/02/2012   Procedure: HERNIA REPAIR EPIGASTRIC ADULT;  Surgeon: Pedro Earls, MD;  Location: [facility];  Service: General;  Laterality: N/A;  Laparoscopic Repair Paraesophageal Hernia, [place]  . LAPAROSCOPIC NISSEN FUNDOPLICATION  95/28/4132   Procedure: LAPAROSCOPIC NISSEN FUNDOPLICATION;  Surgeon: Pedro Earls, MD;  Location: [facility];  Service: General;  Laterality: N/A;  . MASTECTOMY  04/2009     Bilateral for ductal carcinoma on L  . OVARY SURGERY  1986   ovarian tumor removed     ALLERGIES:  Allergies  Allergen Reactions  . Arimidex [Anastrozole]     Joint pain  . Glipizide Other (See Comments)    Hypoglycemia   . Metformin And Related     diarrhea  . Penicillins Rash     CURRENT MEDICATIONS:  Outpatient Encounter Medications as of 08/27/2017  Medication Sig  . amLODipine (NORVASC) 5 MG tablet TAKE 1 TABLET BY MOUTH ONCE DAILY  . aspirin EC 81 MG tablet Take 81 mg by mouth daily.  Marland Kitchen dexlansoprazole (DEXILANT) 60 MG capsule Take 1 capsule (60 mg total) by mouth daily.  Marland Kitchen ezetimibe (ZETIA) 10 MG tablet TAKE 1 TABLET BY MOUTH ONCE DAILY  . fluticasone (FLONASE) 50 MCG/ACT nasal spray Place 2 sprays into both nostrils daily as needed. Allergies  . glucose blood (FREESTYLE LITE) test strip Use to check blood sugar once daily and as needed (diagnosis E11.65)  . hydrochlorothiazide (HYDRODIURIL) 25 MG tablet TAKE ONE TABLET BY MOUTH ONCE DAILY  . hydrocortisone (ANUSOL-HC) 25 MG suppository Place 1 suppository (25 mg total) rectally at bedtime.  Marland Kitchen levothyroxine (SYNTHROID, LEVOTHROID) 50 MCG tablet TAKE 1 TABLET BY MOUTH ONCE DAILY BEFORE BREAKFAST  . losartan (COZAAR) 100 MG tablet TAKE 1 TABLET BY MOUTH ONCE DAILY  . meclizine (ANTIVERT) 25 MG tablet Take 1 tablet (25 mg total) by mouth 3 (three) times daily as needed for dizziness.  . metFORMIN (GLUCOPHAGE-XR) 500 MG 24 hr tablet Take 3 tablets (1,500 mg total) by mouth daily with breakfast.  . Multiple Vitamin (MULTIVITAMIN WITH MINERALS) TABS tablet Take 1 tablet by mouth daily.  Marland Kitchen omeprazole (PRILOSEC) 20 MG capsule TAKE ONE CAPSULE BY MOUTH TWICE DAILY BEFORE MEAL(S)  . rOPINIRole (REQUIP) 1 MG tablet TAKE 1 TABLET BY MOUTH AT BEDTIME  . sertraline (ZOLOFT) 100 MG tablet TAKE 1 TABLET BY MOUTH AT BEDTIME  . [DISCONTINUED] cetirizine (ZYRTEC) 10 MG tablet Take 10 mg by mouth daily as needed. For allergy symptoms    No facility-administered encounter medications on file as of 08/27/2017.      ONCOLOGIC FAMILY HISTORY:  Family History  Problem Relation Age of Onset  . Heart attack Father   . Diabetes Father   . Esophageal cancer Father   . Breast cancer Sister        x 2 sisters  . Diabetes Sister   . Hypertension Brother   . Diabetes Brother   . Hypertension Mother   . Stroke Mother   . Breast cancer Mother   . Hypertension Sister       SOCIAL HISTORY:  Social History   Socioeconomic History  . Marital status: Married    Spouse name: Not on file  . Number of children: 3  . Years of education: Not on file  . Highest education level: Not on file  Occupational  History  . Occupation: retired    Fish farm manager: UNEMPLOYED  Social Needs  . Financial resource strain: Not on file  . Food insecurity:    Worry: Not on file    Inability: Not on file  . Transportation needs:    Medical: Not on file    Non-medical: Not on file  Tobacco Use  . Smoking status: Never Smoker  . Smokeless tobacco: Never Used  Substance and Sexual Activity  . Alcohol use: No    Alcohol/week: 0.0 oz  . Drug use: No  . Sexual activity: Not Currently  Lifestyle  . Physical activity:    Days per week: Not on file    Minutes per session: Not on file  . Stress: Not on file  Relationships  . Social connections:    Talks on phone: Not on file    Gets together: Not on file    Attends religious service: Not on file    Active member of club or organization: Not on file    Attends meetings of clubs or organizations: Not on file    Relationship status: Not on file  . Intimate partner violence:    Fear of current or ex partner: Not on file    Emotionally abused: Not on file    Physically abused: Not on file    Forced sexual activity: Not on file  Other Topics Concern  . Not on file  Social History Narrative   1 cup of coffee every morning     PHYSICAL EXAMINATION:  Vital Signs: Vitals:   08/27/17 1513   BP: 126/79  Pulse: 95  Resp: 18  Temp: 98.7 F (37.1 C)  SpO2: 97%   Filed Weights   08/27/17 1513  Weight: 156 lb (70.8 kg)   General: Well-nourished, well-appearing female in no acute distress.  Unaccompanied today.   HEENT: Head is normocephalic.  Pupils equal and reactive to light. Conjunctivae clear without exudate.  Sclerae anicteric. Oral mucosa is pink, moist.  Oropharynx is pink without lesions or erythema.  Lymph: No cervical, supraclavicular, or infraclavicular lymphadenopathy noted on palpation.  Cardiovascular: Regular rate and rhythm.Marland Kitchen Respiratory: Clear to auscultation bilaterally. Chest expansion symmetric; breathing non-labored.  Breast Exam:  S/p bilateral mastectomies, no nodules, skin changes, tenderness or masses -Axilla: No axillary adenopathy bilaterally.  GI: Abdomen soft and round; non-tender, non-distended. Bowel sounds normoactive. No hepatosplenomegaly.   GU: Deferred.  Neuro: No focal deficits. Steady gait.  Psych: Mood and affect normal and appropriate for situation.  MSK: No focal spinal tenderness to palpation, full range of motion in bilateral upper extremities Extremities: No edema. Skin: Warm and dry.  LABORATORY DATA:  None for this visit   DIAGNOSTIC IMAGING:  Most recent mammogram: n/a s/p bilateral mastectomies    ASSESSMENT AND PLAN:  Ms.. Gautney is a pleasant 72 y.o. female with history of Stage IA right breast invasive ductal carcinoma, ER+/PR+/HER2-, diagnosed in 03/2008, treated with mastectomies, and anti-estrogen therapy with Tamoxifen x 5 years completing therapy in 06/2014.  [facility] for surveillance and routine follow-up.   1. History of breast cancer:  Ms. Demetrius is currently clinically and radiographically without evidence of disease or recurrence of breast cancer. As she is now almost 10 years out from her cancer diagnosis, she will f/u with PCP for her breast cancer surveillance and monitoring from  here on out.    2. Chest wall pain: Mardene Celeste and I reviewed her chest wall pain.  I recommended that  she have a chest xray to fully evaluate and I placed the order for her to have this done sometime this week at [facility].   3. Bone health:  Daniele last underwent bone density in 2015 that was normal.  I will defer to her PCP regarding future bone density testing and management.   4. Cancer screening:  Due to Ms. [place] history and her age, she should receive screening for skin cancers, colon cancer. She was encouraged to follow-up with her PCP for appropriate cancer screenings.   5. Health maintenance and wellness promotion: Ms. Herber was encouraged to consume 5-7 servings of fruits and vegetables per day. She was also encouraged to engage in moderate to vigorous exercise for 30 minutes per day most days of the week. She was instructed to limit her alcohol consumption and continue to abstain from tobacco use.     Dispo:  -Return to cancer center on an as needed basis   A total of (20) minutes of face-to-face time was spent with this patient with greater than 50% of that time in counseling and care-coordination.   Gardenia Phlegm, NP Survivorship Program [facility] [phone]   Note: PRIMARY CARE PROVIDER Abner Greenspan, [place] [phone]

## 2017-08-28 ENCOUNTER — Telehealth: Payer: Self-pay | Admitting: Adult Health

## 2017-08-28 DIAGNOSIS — E113393 Type 2 diabetes mellitus with moderate nonproliferative diabetic retinopathy without macular edema, bilateral: Secondary | ICD-10-CM | POA: Diagnosis not present

## 2017-08-28 DIAGNOSIS — H3562 Retinal hemorrhage, left eye: Secondary | ICD-10-CM | POA: Diagnosis not present

## 2017-08-28 DIAGNOSIS — H34832 Tributary (branch) retinal vein occlusion, left eye, with macular edema: Secondary | ICD-10-CM | POA: Diagnosis not present

## 2017-08-28 DIAGNOSIS — H35032 Hypertensive retinopathy, left eye: Secondary | ICD-10-CM | POA: Diagnosis not present

## 2017-08-28 DIAGNOSIS — H35352 Cystoid macular degeneration, left eye: Secondary | ICD-10-CM | POA: Diagnosis not present

## 2017-08-28 NOTE — Telephone Encounter (Signed)
Per 6/10 no los °

## 2017-08-29 ENCOUNTER — Other Ambulatory Visit (INDEPENDENT_AMBULATORY_CARE_PROVIDER_SITE_OTHER): Payer: Medicare Other

## 2017-08-29 ENCOUNTER — Telehealth (INDEPENDENT_AMBULATORY_CARE_PROVIDER_SITE_OTHER): Payer: Medicare Other | Admitting: Family Medicine

## 2017-08-29 DIAGNOSIS — E1165 Type 2 diabetes mellitus with hyperglycemia: Secondary | ICD-10-CM | POA: Diagnosis not present

## 2017-08-29 DIAGNOSIS — E1140 Type 2 diabetes mellitus with diabetic neuropathy, unspecified: Secondary | ICD-10-CM | POA: Diagnosis not present

## 2017-08-29 DIAGNOSIS — E039 Hypothyroidism, unspecified: Secondary | ICD-10-CM

## 2017-08-29 DIAGNOSIS — E538 Deficiency of other specified B group vitamins: Secondary | ICD-10-CM | POA: Diagnosis not present

## 2017-08-29 DIAGNOSIS — E785 Hyperlipidemia, unspecified: Secondary | ICD-10-CM | POA: Diagnosis not present

## 2017-08-29 DIAGNOSIS — I10 Essential (primary) hypertension: Secondary | ICD-10-CM | POA: Diagnosis not present

## 2017-08-29 LAB — CBC WITH DIFFERENTIAL/PLATELET
Basophils Absolute: 0 K/uL (ref 0.0–0.1)
Basophils Relative: 0.4 % (ref 0.0–3.0)
Eosinophils Absolute: 0 K/uL (ref 0.0–0.7)
Eosinophils Relative: 1.1 % (ref 0.0–5.0)
HCT: 35.1 % — ABNORMAL LOW (ref 36.0–46.0)
Hemoglobin: 11.7 g/dL — ABNORMAL LOW (ref 12.0–15.0)
Lymphocytes Relative: 27.1 % (ref 12.0–46.0)
Lymphs Abs: 1 K/uL (ref 0.7–4.0)
MCHC: 33.2 g/dL (ref 30.0–36.0)
MCV: 82.7 fl (ref 78.0–100.0)
Monocytes Absolute: 0.2 K/uL (ref 0.1–1.0)
Monocytes Relative: 6.8 % (ref 3.0–12.0)
Neutro Abs: 2.4 K/uL (ref 1.4–7.7)
Neutrophils Relative %: 64.6 % (ref 43.0–77.0)
Platelets: 112 K/uL — ABNORMAL LOW (ref 150.0–400.0)
RBC: 4.25 Mil/uL (ref 3.87–5.11)
RDW: 16.2 % — ABNORMAL HIGH (ref 11.5–15.5)
WBC: 3.6 K/uL — ABNORMAL LOW (ref 4.0–10.5)

## 2017-08-29 LAB — COMPREHENSIVE METABOLIC PANEL WITH GFR
ALT: 24 U/L (ref 0–35)
AST: 32 U/L (ref 0–37)
Albumin: 4.4 g/dL (ref 3.5–5.2)
Alkaline Phosphatase: 74 U/L (ref 39–117)
BUN: 18 mg/dL (ref 6–23)
CO2: 31 meq/L (ref 19–32)
Calcium: 9.5 mg/dL (ref 8.4–10.5)
Chloride: 102 meq/L (ref 96–112)
Creatinine, Ser: 0.84 mg/dL (ref 0.40–1.20)
GFR: 71.24 mL/min (ref >60.00)
Glucose, Bld: 180 mg/dL — ABNORMAL HIGH (ref 70–99)
Potassium: 4.3 meq/L (ref 3.5–5.1)
Sodium: 141 meq/L (ref 135–145)
Total Bilirubin: 0.4 mg/dL (ref 0.2–1.2)
Total Protein: 7.6 g/dL (ref 6.0–8.3)

## 2017-08-29 LAB — LIPID PANEL
Cholesterol: 150 mg/dL (ref 0–200)
HDL: 26 mg/dL — ABNORMAL LOW (ref >39.00)
NonHDL: 124.1
Total CHOL/HDL Ratio: 6
Triglycerides: 361 mg/dL — ABNORMAL HIGH (ref 0.0–149.0)
VLDL: 72.2 mg/dL — ABNORMAL HIGH (ref 0.0–40.0)

## 2017-08-29 LAB — LDL CHOLESTEROL, DIRECT: Direct LDL: 82 mg/dL

## 2017-08-29 LAB — VITAMIN B12: VITAMIN B 12: 237 pg/mL (ref 211–911)

## 2017-08-29 LAB — HEMOGLOBIN A1C: Hgb A1c MFr Bld: 7.5 % — ABNORMAL HIGH (ref 4.6–6.5)

## 2017-08-29 LAB — TSH: TSH: 1.69 u[IU]/mL (ref 0.35–4.50)

## 2017-08-29 NOTE — Telephone Encounter (Signed)
labs

## 2017-08-31 ENCOUNTER — Encounter: Payer: Self-pay | Admitting: Family Medicine

## 2017-08-31 ENCOUNTER — Ambulatory Visit (INDEPENDENT_AMBULATORY_CARE_PROVIDER_SITE_OTHER): Payer: Medicare Other | Admitting: Family Medicine

## 2017-08-31 ENCOUNTER — Ambulatory Visit: Payer: Medicare Other

## 2017-08-31 ENCOUNTER — Telehealth: Payer: Self-pay

## 2017-08-31 ENCOUNTER — Encounter: Payer: Medicare Other | Admitting: Family Medicine

## 2017-08-31 VITALS — BP 124/86 | HR 83 | Temp 98.4°F | Ht 63.5 in | Wt 154.2 lb

## 2017-08-31 DIAGNOSIS — D649 Anemia, unspecified: Secondary | ICD-10-CM | POA: Diagnosis not present

## 2017-08-31 DIAGNOSIS — E1140 Type 2 diabetes mellitus with diabetic neuropathy, unspecified: Secondary | ICD-10-CM

## 2017-08-31 DIAGNOSIS — E785 Hyperlipidemia, unspecified: Secondary | ICD-10-CM | POA: Diagnosis not present

## 2017-08-31 DIAGNOSIS — I10 Essential (primary) hypertension: Secondary | ICD-10-CM

## 2017-08-31 DIAGNOSIS — M25551 Pain in right hip: Secondary | ICD-10-CM | POA: Diagnosis not present

## 2017-08-31 DIAGNOSIS — E1142 Type 2 diabetes mellitus with diabetic polyneuropathy: Secondary | ICD-10-CM

## 2017-08-31 DIAGNOSIS — E1165 Type 2 diabetes mellitus with hyperglycemia: Secondary | ICD-10-CM

## 2017-08-31 DIAGNOSIS — E1143 Type 2 diabetes mellitus with diabetic autonomic (poly)neuropathy: Secondary | ICD-10-CM

## 2017-08-31 DIAGNOSIS — E538 Deficiency of other specified B group vitamins: Secondary | ICD-10-CM

## 2017-08-31 DIAGNOSIS — K3184 Gastroparesis: Secondary | ICD-10-CM | POA: Diagnosis not present

## 2017-08-31 DIAGNOSIS — C50911 Malignant neoplasm of unspecified site of right female breast: Secondary | ICD-10-CM | POA: Diagnosis not present

## 2017-08-31 DIAGNOSIS — D696 Thrombocytopenia, unspecified: Secondary | ICD-10-CM

## 2017-08-31 DIAGNOSIS — Z170 Estrogen receptor positive status [ER+]: Secondary | ICD-10-CM

## 2017-08-31 DIAGNOSIS — Z0000 Encounter for general adult medical examination without abnormal findings: Secondary | ICD-10-CM | POA: Diagnosis not present

## 2017-08-31 DIAGNOSIS — M25552 Pain in left hip: Secondary | ICD-10-CM

## 2017-08-31 DIAGNOSIS — E039 Hypothyroidism, unspecified: Secondary | ICD-10-CM

## 2017-08-31 HISTORY — DX: Pain in right hip: M25.551

## 2017-08-31 MED ORDER — ROPINIROLE HCL 1 MG PO TABS: 1.0000 mg | ORAL_TABLET | Freq: Every day | ORAL | 3 refills | Status: DC

## 2017-08-31 MED ORDER — LEVOTHYROXINE SODIUM 50 MCG PO TABS: ORAL_TABLET | ORAL | 3 refills | Status: DC

## 2017-08-31 MED ORDER — METFORMIN HCL ER 500 MG PO TB24: 1500.0000 mg | ORAL_TABLET | Freq: Every day | ORAL | 3 refills | Status: DC

## 2017-08-31 MED ORDER — HYDROCHLOROTHIAZIDE 25 MG PO TABS: 25.0000 mg | ORAL_TABLET | Freq: Every day | ORAL | 3 refills | Status: DC

## 2017-08-31 MED ORDER — OMEPRAZOLE 20 MG PO CPDR: DELAYED_RELEASE_CAPSULE | ORAL | 3 refills | Status: DC

## 2017-08-31 MED ORDER — FLUTICASONE PROPIONATE 50 MCG/ACT NA SUSP: 2.0000 | Freq: Every day | NASAL | 3 refills | Status: DC | PRN

## 2017-08-31 MED ORDER — SERTRALINE HCL 100 MG PO TABS: 100.0000 mg | ORAL_TABLET | Freq: Every day | ORAL | 3 refills | Status: DC

## 2017-08-31 MED ORDER — EZETIMIBE 10 MG PO TABS: 10.0000 mg | ORAL_TABLET | Freq: Every day | ORAL | 3 refills | Status: DC

## 2017-08-31 MED ORDER — LOSARTAN POTASSIUM 100 MG PO TABS: 100.0000 mg | ORAL_TABLET | Freq: Every day | ORAL | 3 refills | Status: DC

## 2017-08-31 MED ORDER — AMLODIPINE BESYLATE 5 MG PO TABS: 5.0000 mg | ORAL_TABLET | Freq: Every day | ORAL | 3 refills | Status: DC

## 2017-08-31 NOTE — Progress Notes (Signed)
PCP notes:   Health maintenance:  Foot exam - PCP please address at next appt  Abnormal screenings:   Fall risk - hx of single fall Fall Risk  08/31/2017 08/22/2016 02/24/2016 01/26/2015 01/07/2014  Falls in the past year? Yes No No Yes Yes  Comment pt lost balance and fell off bed - Emmi Telephone Survey: data to providers prior to load - -  Number falls in past yr: 1 - - 1 1  Injury with Fall? Yes - - No Yes  Comment bruise to right knee - - - -    Depression score:15 Depression screen [facility] 2/9 08/31/2017 08/22/2016 01/26/2015 01/07/2014 12/06/2012  Decreased Interest 2 0 0 0 0  Down, Depressed, Hopeless 2 0 0 0 0  PHQ - 2 Score 4 0 0 0 0  Altered sleeping 2 - - - -  Tired, decreased energy 3 - - - -  Change in appetite 0 - - - -  Feeling bad or failure about yourself  0 - - - -  Trouble concentrating 3 - - - -  Moving slowly or fidgety/restless 3 - - - -  Suicidal thoughts 0 - - - -  PHQ-9 Score 15 - - - -  Difficult doing work/chores Very difficult - - - -   Patient concerns:   None  Nurse concerns:  None  Next PCP appt:   08/31/17 @ 1130  I reviewed health advisor's note, was available for consultation, and agree with documentation and plan. Loura Pardon MD

## 2017-08-31 NOTE — Assessment & Plan Note (Signed)
bp in fair control at this time  BP Readings from Last 1 Encounters:  08/31/17 124/86   No changes needed Most recent labs reviewed  Disc lifstyle change with low sodium diet and exercise

## 2017-08-31 NOTE — Assessment & Plan Note (Signed)
Stable  DM control is improving

## 2017-08-31 NOTE — Progress Notes (Signed)
Subjective:    Patient ID: Valerie Ball, female    DOB: 1945/05/05, 72 y.o.   MRN: [id number]  HPI .here for annual f/u of chronic health problems   Doing fair  Arthritis is bothering her a lot more lately  Back and hips and knees (outside of the hips)  Would like to see orthopedics   Wt Readings from Last 3 Encounters:  08/31/17 154 lb 4 oz (70 kg)  08/31/17 154 lb 4 oz (70 kg)  08/27/17 156 lb (70.8 kg)  has lost a little weight this year  Walks her dog 2-3 times per day  Works in the flowers also  Really trying to eat better!   Got her husband to loose weight  26.90 kg/m   Had amw today  Depression score 5 1 fall (no fracture)   Dr Alonna Buckler  Getting shots - ? Retinal problem/ not sure what it is (vein occlusion)  Also some laser surgery  L eye- vision is bad   A lot of aches and pains   Mammogram - does not get due to bilat mastectomy for breast cancer  Self breast exam (chest)-no lumps but has some aching Had her survivorship f/u - sent her for xray that was normal   Colonoscopy 1/16 with 10 y recall - hyperplastic polyps   dexa 12/15-in the normal range  She takes mvi   Zoster status - interested in shingrix   bp is stable today  No cp or palpitations or headaches or edema  No side effects to medicines  BP Readings from Last 3 Encounters:  08/31/17 124/86  08/31/17 124/86  08/27/17 126/79      DM with gastroparesis and neuropathy  Lab Results  Component Value Date   HGBA1C 7.5 (H) 08/29/2017  down from 8.4  Eating a lot better  Happy about that  Cannot take glipizide due to low glucose  Feet continue to hurt and burn   Stomach is about the same  Also IBS     Hyperlipidemia Lab Results  Component Value Date   CHOL 150 08/29/2017   CHOL 149 09/04/2016   CHOL 192 06/06/2016   Lab Results  Component Value Date   HDL 26.00 (L) 08/29/2017   HDL 29.20 (L) 09/04/2016   HDL 32.40 (L) 06/06/2016   No results found for: [facility]  Results  Component Value Date   TRIG 361.0 (H) 08/29/2017   TRIG 358.0 (H) 09/04/2016   TRIG 386.0 (H) 06/06/2016   Lab Results  Component Value Date   CHOLHDL 6 08/29/2017   CHOLHDL 5 09/04/2016   CHOLHDL 6 06/06/2016   Lab Results  Component Value Date   LDLDIRECT 82.0 08/29/2017   LDLDIRECT 85.0 09/04/2016   LDLDIRECT 108.0 06/06/2016   intol of statin  Takes zetia  No change   Hx of low platelets  Lab Results  Component Value Date   WBC 3.6 (L) 08/29/2017   HGB 11.7 (L) 08/29/2017   HCT 35.1 (L) 08/29/2017   MCV 82.7 08/29/2017   PLT 112.0 (L) 08/29/2017   down from 127 Chronic anemia -fairly stable  Lab Results  Component Value Date   VITAMINB12 237 08/29/2017  will start back on vitamin B12 orally   Hypothyroidism  Pt has no clinical changes No change in energy level/ hair or skin/ edema and no tremor Lab Results  Component Value Date   TSH 1.69 08/29/2017     Patient Active Problem List   Diagnosis Date  Noted  . Hip pain, bilateral 08/31/2017  . Vitamin B12 deficiency 09/21/2016  . Benign neoplasm of tongue 04/11/2016  . Diabetic gastroparesis ([place]) 03/27/2016  . Retinal vein occlusion, branch 01/03/2016  . Alternating constipation and diarrhea 08/12/2015  . AVM (arteriovenous malformation) of colon 05/18/2015  . IBS (irritable bowel syndrome) 02/28/2015  . Cough 12/08/2014  . Diverticulitis of colon 10/14/2014  . Gout 07/22/2014  . Hypothyroidism 04/23/2014  . Nausea without vomiting 02/18/2014  . Encounter for Medicare annual wellness exam 01/23/2014  . Estrogen deficiency 01/23/2014  . Colon cancer screening 01/23/2014  . Abdominal pain, epigastric 01/20/2014  . Tremor 01/20/2014  . RUQ abdominal pain 01/20/2014  . Dizziness 01/07/2014  . Allergic rhinitis 01/07/2014  . Thrombocytopenia ([place]) 11/25/2013  . Diabetic neuropathy ([place]) 11/25/2013  . History of breast cancer 06/07/2012  . Lap Nissen October 2013 01/19/2012  . Large type III  mixed hiatus hernia  12/06/2011  . Stress reaction 10/25/2011  . Routine general medical examination at a health care facility 07/11/2011  . ADVERSE DRUG REACTION 05/05/2010  . TRANSAMINASES, SERUM, ELEVATED 11/11/2009  . Breast cancer, right breast ([place]) 08/02/2009  . DIVERTICULOSIS-COLON 03/03/2008  . ESOPHAGEAL STRICTURE 03/02/2008  . HIATAL HERNIA 03/02/2008  . PERITONEAL ADHESIONS 03/02/2008  . Anemia 02/07/2008  . Poorly controlled type 2 diabetes mellitus with neuropathy ([place]) 08/19/2007  . ANXIETY 04/15/2007  . DISORDERS, ORGANIC SLEEP NEC 08/23/2006  . SYNDROME, RESTLESS LEGS 08/23/2006  . SYNDROME, CARPAL TUNNEL 08/23/2006  . Hyperlipidemia LDL goal <100 08/22/2006  . Essential hypertension 08/22/2006  . ALLERGIC RHINITIS 08/22/2006  . GERD 08/22/2006  . Fatty liver 08/22/2006  . History of seizure 08/22/2006   Past Medical History:  Diagnosis Date  . Allergy    allegic rhinitis  . Anemia   . Angiodysplasia of colon   . Anxiety   . Arthritis   . Blood transfusion   . Borderline diabetic    per patient medical history form  . Breast cancer ([place]) 02/2009   breast CA L invasive ductal CA(hormone receptor positive.)  . Cataract   . Colon polyp    hyperplastic  . Colon stricture ([place]) 01/14/2003  . Diabetes mellitus    pt's medical doctor took her off diabetic meds-blood sugars have not been a problem--and medications were causing pt's sugars to be too low  . Diverticulosis   . Esophageal stricture 02/04/2008  . GERD (gastroesophageal reflux disease)   . GI bleed   . Hepatic steatosis   . Hiatal hernia   . Hyperlipidemia   . Hypertension   . Hypothyroidism   . IBS (irritable bowel syndrome)   . Melena 01/2008   anemia transfusion  . Menopause    per patient medical history form  . Osteoporosis   . Postmenopausal hormone therapy    per patient medical history form.  Marland Kitchen RLS (restless legs syndrome)   . Salmonella 05/2000   renal effects secondary to  dehydration  . Seizure disorder ([place])   . Seizures ([place])    one seizure several yrs ago --etiology unknown-no problem since   Past Surgical History:  Procedure Laterality Date  . ABDOMINAL HYSTERECTOMY  1983  . BREAST SURGERY  02/2009   breast biopsy invasive ductal CA-bilateral mastectomies  . EPIGASTRIC HERNIA REPAIR  01/02/2012   Procedure: HERNIA REPAIR EPIGASTRIC ADULT;  Surgeon: Pedro Earls, MD;  Location: [facility];  Service: General;  Laterality: N/A;  Laparoscopic Repair Paraesophageal Hernia, Nissen  . EYE SURGERY Left 07/2017  Dr. Zadie Rhine  . LAPAROSCOPIC NISSEN FUNDOPLICATION  44/31/5400   Procedure: LAPAROSCOPIC NISSEN FUNDOPLICATION;  Surgeon: Pedro Earls, MD;  Location: [facility];  Service: General;  Laterality: N/A;  . MASTECTOMY  04/2009   Bilateral for ductal carcinoma on L  . OVARY SURGERY  1986   ovarian tumor removed   Social History   Tobacco Use  . Smoking status: Never Smoker  . Smokeless tobacco: Never Used  Substance Use Topics  . Alcohol use: No    Alcohol/week: 0.0 oz  . Drug use: No   Family History  Problem Relation Age of Onset  . Heart attack Father   . Diabetes Father   . Esophageal cancer Father   . Breast cancer Sister        x 2 sisters  . Diabetes Sister   . Hypertension Brother   . Diabetes Brother   . Hypertension Mother   . Stroke Mother   . Breast cancer Mother   . Hypertension Sister    Allergies  Allergen Reactions  . Arimidex [Anastrozole]     Joint pain  . Glipizide Other (See Comments)    Hypoglycemia   . Metformin And Related     diarrhea  . Penicillins Rash   Current Outpatient Medications on File Prior to Visit  Medication Sig Dispense Refill  . aspirin EC 81 MG tablet Take 81 mg by mouth daily.    Marland Kitchen glucose blood (FREESTYLE LITE) test strip Use to check blood sugar once daily and as needed (diagnosis E11.65) 100 each 1  . Multiple Vitamin (MULTIVITAMIN WITH MINERALS) TABS tablet Take 1 tablet by mouth  daily.    . [DISCONTINUED] cetirizine (ZYRTEC) 10 MG tablet Take 10 mg by mouth daily as needed. For allergy symptoms     No current facility-administered medications on file prior to visit.     Review of Systems  Constitutional: Positive for fatigue. Negative for activity change, appetite change, fever and unexpected weight change.  HENT: Negative for congestion, ear pain, rhinorrhea, sinus pressure and sore throat.   Eyes: Negative for pain, redness and visual disturbance.  Respiratory: Negative for cough, shortness of breath and wheezing.   Cardiovascular: Negative for chest pain and palpitations.  Gastrointestinal: Negative for abdominal pain, blood in stool, constipation, diarrhea and vomiting.  Endocrine: Negative for polydipsia and polyuria.  Genitourinary: Negative for dysuria, frequency and urgency.  Musculoskeletal: Positive for arthralgias and back pain. Negative for myalgias.  Skin: Negative for pallor and rash.  Allergic/Immunologic: Negative for environmental allergies.  Neurological: Positive for numbness. Negative for dizziness, syncope and headaches.  Hematological: Negative for adenopathy. Does not bruise/bleed easily.  Psychiatric/Behavioral: Negative for decreased concentration and dysphoric mood. The patient is not nervous/anxious.        Objective:   Physical Exam  Constitutional: She appears well-developed and well-nourished. No distress.  Well appearing   HENT:  Head: Normocephalic and atraumatic.  Right Ear: External ear normal.  Left Ear: External ear normal.  Mouth/Throat: Oropharynx is clear and moist.  Eyes: Pupils are equal, round, and reactive to light. Conjunctivae and EOM are normal. No scleral icterus.  Neck: Normal range of motion. Neck supple. No JVD present. Carotid bruit is not present. No thyromegaly present.  Cardiovascular: Normal rate, regular rhythm, normal heart sounds and intact distal pulses. Exam reveals no gallop.  Pulmonary/Chest:  Effort normal and breath sounds normal. No respiratory distress. She has no wheezes. She exhibits no tenderness. No breast tenderness, discharge or bleeding.  Abdominal: Soft. Bowel sounds are normal. She exhibits no distension, no abdominal bruit and no mass. There is no tenderness.  Genitourinary: No breast tenderness, discharge or bleeding.  Genitourinary Comments: S/p mastectomy bilat cw-no lumps or changes   Musculoskeletal: Normal range of motion. She exhibits no edema or tenderness.  Lymphadenopathy:    She has no cervical adenopathy.  Neurological: She is alert. She has normal reflexes. No cranial nerve deficit. She exhibits normal muscle tone. Coordination normal.  Skin: Skin is warm and dry. No rash noted. No erythema. No pallor.  Solar lentigines diffusely  Fair complexion Some sks  Psychiatric: She has a normal mood and affect.  Pleasant           Assessment & Plan:   Problem List Items Addressed This Visit      Cardiovascular and Mediastinum   Essential hypertension - Primary    bp in fair control at this time  BP Readings from Last 1 Encounters:  08/31/17 124/86   No changes needed Most recent labs reviewed  Disc lifstyle change with low sodium diet and exercise        Relevant Medications   amLODipine (NORVASC) 5 MG tablet   ezetimibe (ZETIA) 10 MG tablet   hydrochlorothiazide (HYDRODIURIL) 25 MG tablet   losartan (COZAAR) 100 MG tablet     Digestive   Diabetic gastroparesis (HCC)    Stable  DM control is improving      Relevant Medications   losartan (COZAAR) 100 MG tablet   metFORMIN (GLUCOPHAGE-XR) 500 MG 24 hr tablet     Endocrine   Diabetic neuropathy (HCC)    Stable  DM is improving       Relevant Medications   losartan (COZAAR) 100 MG tablet   metFORMIN (GLUCOPHAGE-XR) 500 MG 24 hr tablet   Hypothyroidism    Hypothyroidism  Pt has no clinical changes No change in energy level/ hair or skin/ edema and no tremor Lab Results    Component Value Date   TSH 1.69 08/29/2017          Relevant Medications   levothyroxine (SYNTHROID, LEVOTHROID) 50 MCG tablet   Poorly controlled type 2 diabetes mellitus with neuropathy (HCC)    Lab Results  Component Value Date   HGBA1C 7.5 (H) 08/29/2017   Improved with better diet Urged to continue this  Disc foot and eye care F/u 6 mo       Relevant Medications   losartan (COZAAR) 100 MG tablet   metFORMIN (GLUCOPHAGE-XR) 500 MG 24 hr tablet     Other   Anemia    Stable anemia  Unknown source Platelets at 112  Watch closely  No bleeding or bruising or clotting problems      Breast cancer, right breast (HCC)    Continues to do well s/p bilat mastectomy      Hip pain, bilateral    Suspect bursitis  Ref to orthopedics      Relevant Orders   Ambulatory referral to Orthopedic Surgery   Hyperlipidemia LDL goal <100    Disc goals for lipids and reasons to control them Rev last labs with pt Rev low sat fat diet in detail  Intol of statin  Continues zetia and good diet       Relevant Medications   amLODipine (NORVASC) 5 MG tablet   ezetimibe (ZETIA) 10 MG tablet   hydrochlorothiazide (HYDRODIURIL) 25 MG tablet   losartan (COZAAR) 100 MG tablet   Thrombocytopenia (HCC)  Pl ct 112  No symptoms or clotting  Continue to follow       Vitamin B12 deficiency    Lab Results  Component Value Date   VITAMINB12 237 08/29/2017   Recommend starting 1000 mcg B12 daily

## 2017-08-31 NOTE — Assessment & Plan Note (Signed)
Continues to do well s/p bilat mastectomy

## 2017-08-31 NOTE — Assessment & Plan Note (Signed)
Suspect bursitis  Ref to orthopedics

## 2017-08-31 NOTE — Assessment & Plan Note (Signed)
Pl ct 112  No symptoms or clotting  Continue to follow

## 2017-08-31 NOTE — Patient Instructions (Addendum)
Get vitamin D3 over the counter - take 2000 iu every day  Continue multi vitamin  Get vitamin B12 1000 mcg and take one a day   If you are interested in the new shingles vaccine (Shingrix) - call your local pharmacy to check on coverage and availability  If affordable - get on waiting list   Labs are fairly stable  Diabetes improving-keep up the good work with diet and exercise as tolerated   We will refer you to orthopedics

## 2017-08-31 NOTE — Assessment & Plan Note (Signed)
Stable anemia  Unknown source Platelets at 112  Watch closely  No bleeding or bruising or clotting problems

## 2017-08-31 NOTE — Telephone Encounter (Signed)
Left message for patient to call Anastasiya back in regards to a referral-Anastasiya V Hopkins, RMA   

## 2017-08-31 NOTE — Patient Instructions (Signed)
Valerie Ball , Thank you for taking time to come for your Medicare Wellness Visit. I appreciate your ongoing commitment to your health goals. Please review the following plan we discussed and let me know if I can assist you in the future.   These are the goals we discussed: Goals    . Weight (lb) < 135 lb (61.2 kg)     When schedule permits, I will join Weight [employer] and begin exercising at local gym.        This is a list of the screening recommended for you and due dates:  Health Maintenance  Topic Date Due  . Mammogram  07/18/2020*  .  Hepatitis C: One time screening is recommended by Center for Disease Control  (CDC) for  adults born from [birth year] through 1963.   04/22/2023*  . Flu Shot  10/18/2017  . Hemoglobin A1C  02/28/2018  . Eye exam for diabetics  05/19/2018  . Complete foot exam   09/01/2018  . Tetanus Vaccine  07/18/2021  . Colon Cancer Screening  03/24/2024  . DEXA scan (bone density measurement)  Completed  . Pneumonia vaccines  Completed  *Topic was postponed. The date shown is not the original due date.   Preventive Care for Adults  A healthy lifestyle and preventive care can promote health and wellness. Preventive health guidelines for adults include the following key practices.  . A routine yearly physical is a good way to check with your health care provider about your health and preventive screening. It is a chance to share any concerns and updates on your health and to receive a thorough exam.  . Visit your dentist for a routine exam and preventive care every 6 months. Brush your teeth twice a day and floss once a day. Good oral hygiene prevents tooth decay and gum disease.  . The frequency of eye exams is based on your age, health, family medical history, use  of contact lenses, and other factors. Follow your health care provider's recommendations for frequency of eye exams.  . Eat a healthy diet. Foods like vegetables, fruits, whole grains, low-fat dairy  products, and lean protein foods contain the nutrients you need without too many calories. Decrease your intake of foods high in solid fats, added sugars, and salt. Eat the right amount of calories for you. Get information about a proper diet from your health care provider, if necessary.  . Regular physical exercise is one of the most important things you can do for your health. Most adults should get at least 150 minutes of moderate-intensity exercise (any activity that increases your heart rate and causes you to sweat) each week. In addition, most adults need muscle-strengthening exercises on 2 or more days a week.  Silver Sneakers may be a benefit available to you. To determine eligibility, you may visit the website: www.silversneakers.com or contact program at 606-207-0941 Mon-Fri between 8AM-8PM.   . Maintain a healthy weight. The body mass index (BMI) is a screening tool to identify possible weight problems. It provides an estimate of body fat based on height and weight. Your health care provider can find your BMI and can help you achieve or maintain a healthy weight.   For adults 20 years and older: ? A BMI below 18.5 is considered underweight. ? A BMI of 18.5 to 24.9 is normal. ? A BMI of 25 to 29.9 is considered overweight. ? A BMI of 30 and above is considered obese.   . Maintain normal blood lipids  and cholesterol levels by exercising and minimizing your intake of saturated fat. Eat a balanced diet with plenty of fruit and vegetables. Blood tests for lipids and cholesterol should begin at age 56 and be repeated every 5 years. If your lipid or cholesterol levels are high, you are over 50, or you are at high risk for heart disease, you may need your cholesterol levels checked more frequently. Ongoing high lipid and cholesterol levels should be treated with medicines if diet and exercise are not working.  . If you smoke, find out from your health care provider how to quit. If you do not  use tobacco, please do not start.  . If you choose to drink alcohol, please do not consume more than 2 drinks per day. One drink is considered to be 12 ounces (355 mL) of beer, 5 ounces (148 mL) of wine, or 1.5 ounces (44 mL) of liquor.  . If you are 54-60 years old, ask your health care provider if you should take aspirin to prevent strokes.  . Use sunscreen. Apply sunscreen liberally and repeatedly throughout the day. You should seek shade when your shadow is shorter than you. Protect yourself by wearing long sleeves, pants, a wide-brimmed hat, and sunglasses year round, whenever you are outdoors.  . Once a month, do a whole body skin exam, using a mirror to look at the skin on your back. Tell your health care provider of new moles, moles that have irregular borders, moles that are larger than a pencil eraser, or moles that have changed in shape or color.

## 2017-08-31 NOTE — Assessment & Plan Note (Signed)
Stable  DM is improving

## 2017-08-31 NOTE — Assessment & Plan Note (Signed)
Lab Results  Component Value Date   HGBA1C 7.5 (H) 08/29/2017   Improved with better diet Urged to continue this  Disc foot and eye care F/u 6 mo

## 2017-08-31 NOTE — Assessment & Plan Note (Signed)
Hypothyroidism  Pt has no clinical changes No change in energy level/ hair or skin/ edema and no tremor Lab Results  Component Value Date   TSH 1.69 08/29/2017

## 2017-08-31 NOTE — Progress Notes (Signed)
Subjective:   Valerie Ball is a 72 y.o. female who presents for Medicare Annual (Subsequent) preventive examination.  Review of Systems:  N/A Cardiac Risk Factors include: advanced age (>50men, >81 women)     Objective:     Vitals: BP 124/86 (BP Location: Right Arm, Patient Position: Sitting, Cuff Size: Normal)   Pulse 83   Temp 98.4 F (36.9 C) (Oral)   Ht 5' 3.5" (1.613 m) Comment: no shoes  Wt 154 lb 4 oz (70 kg)   LMP 03/20/1981   SpO2 97%   BMI 26.90 kg/m   Body mass index is 26.9 kg/m.  Advanced Directives 08/31/2017 08/22/2016 06/30/2015 05/28/2014 02/05/2014 01/04/2012 01/02/2012  Does Patient Have a Medical Advance Directive? No No No No No Patient does not have advance directive;Patient would like information -  Would patient like information on creating a medical advance directive? No - Patient declined - No - patient declined information No - patient declined information - Advance directive packet given;Other (Comment) -  Pre-existing out of facility DNR order (yellow form or pink MOST form) - - - - - No No    Tobacco Social History   Tobacco Use  Smoking Status Never Smoker  Smokeless Tobacco Never Used     Counseling given: No   Clinical Intake:  Pre-visit preparation completed: Yes  Pain : 0-10 Pain Score: 5  Pain Type: Chronic pain Pain Location: Generalized     Nutritional Status: BMI 25 -29 Overweight Nutritional Risks: None Diabetes: Yes CBG done?: No Did pt. bring in CBG monitor from home?: No  How often do you need to have someone help you when you read instructions, pamphlets, or other written materials from your doctor or pharmacy?: 1 - Never What is the last grade level you completed in school?: 12th grade + 2 yrs college  Interpreter Needed?: No  Comments: pt lives with spouse Information entered by :: LPinson, LPN  Past Medical History:  Diagnosis Date  . Allergy    allegic rhinitis  . Anemia   . Angiodysplasia of colon    . Anxiety   . Arthritis   . Blood transfusion   . Borderline diabetic    per patient medical history form  . Breast cancer ([place]) 02/2009   breast CA L invasive ductal CA(hormone receptor positive.)  . Cataract   . Colon polyp    hyperplastic  . Colon stricture ([place]) 01/14/2003  . Diabetes mellitus    pt's medical doctor took her off diabetic meds-blood sugars have not been a problem--and medications were causing pt's sugars to be too low  . Diverticulosis   . Esophageal stricture 02/04/2008  . GERD (gastroesophageal reflux disease)   . GI bleed   . Hepatic steatosis   . Hiatal hernia   . Hyperlipidemia   . Hypertension   . Hypothyroidism   . IBS (irritable bowel syndrome)   . Melena 01/2008   anemia transfusion  . Menopause    per patient medical history form  . Osteoporosis   . Postmenopausal hormone therapy    per patient medical history form.  Marland Kitchen RLS (restless legs syndrome)   . Salmonella 05/2000   renal effects secondary to dehydration  . Seizure disorder ([place])   . Seizures ([place])    one seizure several yrs ago --etiology unknown-no problem since   Past Surgical History:  Procedure Laterality Date  . ABDOMINAL HYSTERECTOMY  1983  . BREAST SURGERY  02/2009   breast biopsy invasive ductal  CA-bilateral mastectomies  . EPIGASTRIC HERNIA REPAIR  01/02/2012   Procedure: HERNIA REPAIR EPIGASTRIC ADULT;  Surgeon: Pedro Earls, MD;  Location: [facility];  Service: General;  Laterality: N/A;  Laparoscopic Repair Paraesophageal Hernia, [place]  . EYE SURGERY Left 07/2017   Dr. Zadie Rhine  . LAPAROSCOPIC NISSEN FUNDOPLICATION  16/12/9602   Procedure: LAPAROSCOPIC NISSEN FUNDOPLICATION;  Surgeon: Pedro Earls, MD;  Location: [facility];  Service: General;  Laterality: N/A;  . MASTECTOMY  04/2009   Bilateral for ductal carcinoma on L  . OVARY SURGERY  1986   ovarian tumor removed   Family History  Problem Relation Age of Onset  . Heart attack Father   . Diabetes Father   .  Esophageal cancer Father   . Breast cancer Sister        x 2 sisters  . Diabetes Sister   . Hypertension Brother   . Diabetes Brother   . Hypertension Mother   . Stroke Mother   . Breast cancer Mother   . Hypertension Sister    Social History   Socioeconomic History  . Marital status: Married    Spouse name: Not on file  . Number of children: 3  . Years of education: Not on file  . Highest education level: Not on file  Occupational History  . Occupation: retired    Fish farm manager: UNEMPLOYED  Social Needs  . Financial resource strain: Not on file  . Food insecurity:    Worry: Not on file    Inability: Not on file  . Transportation needs:    Medical: Not on file    Non-medical: Not on file  Tobacco Use  . Smoking status: Never Smoker  . Smokeless tobacco: Never Used  Substance and Sexual Activity  . Alcohol use: No    Alcohol/week: 0.0 oz  . Drug use: No  . Sexual activity: Not Currently  Lifestyle  . Physical activity:    Days per week: Not on file    Minutes per session: Not on file  . Stress: Not on file  Relationships  . Social connections:    Talks on phone: Not on file    Gets together: Not on file    Attends religious service: Not on file    Active member of club or organization: Not on file    Attends meetings of clubs or organizations: Not on file    Relationship status: Not on file  Other Topics Concern  . Not on file  Social History Narrative   1 cup of coffee every morning    Outpatient Encounter Medications as of 08/31/2017  Medication Sig  . aspirin EC 81 MG tablet Take 81 mg by mouth daily.  Marland Kitchen glucose blood (FREESTYLE LITE) test strip Use to check blood sugar once daily and as needed (diagnosis E11.65)  . Multiple Vitamin (MULTIVITAMIN WITH MINERALS) TABS tablet Take 1 tablet by mouth daily.  . [DISCONTINUED] amLODipine (NORVASC) 5 MG tablet TAKE 1 TABLET BY MOUTH ONCE DAILY  . [DISCONTINUED] ezetimibe (ZETIA) 10 MG tablet TAKE 1 TABLET BY MOUTH  ONCE DAILY  . [DISCONTINUED] fluticasone (FLONASE) 50 MCG/ACT nasal spray Place 2 sprays into both nostrils daily as needed. Allergies  . [DISCONTINUED] hydrochlorothiazide (HYDRODIURIL) 25 MG tablet TAKE ONE TABLET BY MOUTH ONCE DAILY  . [DISCONTINUED] levothyroxine (SYNTHROID, LEVOTHROID) 50 MCG tablet TAKE 1 TABLET BY MOUTH ONCE DAILY BEFORE BREAKFAST  . [DISCONTINUED] losartan (COZAAR) 100 MG tablet TAKE 1 TABLET BY MOUTH ONCE DAILY  . [DISCONTINUED]  metFORMIN (GLUCOPHAGE-XR) 500 MG 24 hr tablet Take 3 tablets (1,500 mg total) by mouth daily with breakfast.  . [DISCONTINUED] omeprazole (PRILOSEC) 20 MG capsule TAKE ONE CAPSULE BY MOUTH TWICE DAILY BEFORE MEAL(S)  . [DISCONTINUED] rOPINIRole (REQUIP) 1 MG tablet TAKE 1 TABLET BY MOUTH AT BEDTIME  . [DISCONTINUED] sertraline (ZOLOFT) 100 MG tablet TAKE 1 TABLET BY MOUTH AT BEDTIME  . [DISCONTINUED] cetirizine (ZYRTEC) 10 MG tablet Take 10 mg by mouth daily as needed. For allergy symptoms  . [DISCONTINUED] dexlansoprazole (DEXILANT) 60 MG capsule Take 1 capsule (60 mg total) by mouth daily.  . [DISCONTINUED] hydrocortisone (ANUSOL-HC) 25 MG suppository Place 1 suppository (25 mg total) rectally at bedtime.  . [DISCONTINUED] meclizine (ANTIVERT) 25 MG tablet Take 1 tablet (25 mg total) by mouth 3 (three) times daily as needed for dizziness.   No facility-administered encounter medications on file as of 08/31/2017.     Activities of Daily Living In your present state of health, do you have any difficulty performing the following activities: 08/31/2017  Hearing? N  Vision? Y  Difficulty concentrating or making decisions? Y  Walking or climbing stairs? Y  Dressing or bathing? N  Doing errands, shopping? N  Preparing Food and eating ? N  Using the Toilet? N  In the past six months, have you accidently leaked urine? N  Do you have problems with loss of bowel control? Y  Managing your Medications? N  Managing your Finances? N  Housekeeping or  managing your Housekeeping? N  Some recent data might be hidden    Patient Care Team: Tower, Wynelle Fanny, MD as PCP - General    Assessment:   This is a routine wellness examination for Naydeline.   Hearing Screening   125Hz  250Hz  500Hz  1000Hz  2000Hz  3000Hz  4000Hz  6000Hz  8000Hz   Right ear:   40 40 40  40    Left ear:   40 40 40  40    Vision Screening Comments: Vision exam in 2019 with Dr. Zadie Rhine    Exercise Activities and Dietary recommendations Current Exercise Habits: Home exercise routine, Type of exercise: walking(gardening), Time (Minutes): 30, Frequency (Times/Week): 7, Weekly Exercise (Minutes/Week): 210, Intensity: Mild, Exercise limited by: None identified  Goals    . Weight (lb) < 135 lb (61.2 kg)     When schedule permits, I will join [employer] and begin exercising at local gym.        Fall Risk Fall Risk  08/31/2017 08/22/2016 02/24/2016 01/26/2015 01/07/2014  Falls in the past year? Yes No No Yes Yes  Comment pt lost balance and fell off bed - Emmi Telephone Survey: data to providers prior to load - -  Number falls in past yr: 1 - - 1 1  Injury with Fall? Yes - - No Yes  Comment bruise to right knee - - - -   Depression Screen PHQ 2/9 Scores 08/31/2017 08/22/2016 01/26/2015 01/07/2014  PHQ - 2 Score 4 0 0 0  PHQ- 9 Score 15 - - -     Cognitive Function MMSE - Mini Mental State Exam 08/31/2017 08/22/2016  Orientation to time 5 5  Orientation to Place 5 5  Registration 3 3  Attention/ Calculation 0 0  Recall 3 3  Language- name 2 objects 0 0  Language- repeat 1 1  Language- follow 3 step command 3 3  Language- read & follow direction 0 0  Write a sentence 0 0  Copy design 0 0  Total score 20 20  PLEASE NOTE: A Mini-Cog screen was completed. Maximum score is 20. A value of 0 denotes this part of Folstein MMSE was not completed or the patient failed this part of the Mini-Cog screening.   Mini-Cog Screening Orientation to Time - Max 5 pts Orientation to  Place - Max 5 pts Registration - Max 3 pts Recall - Max 3 pts Language Repeat - Max 1 pts Language Follow 3 Step Command - Max 3 pts     Immunization History  Administered Date(s) Administered  . Influenza Split 01/17/2011  . Influenza Whole 04/04/2005, 01/22/2009  . Influenza,inj,Quad PF,6+ Mos 12/06/2012, 11/25/2013, 01/26/2015, 11/12/2015, 03/29/2017  . Pneumococcal Conjugate-13 01/26/2015  . Pneumococcal Polysaccharide-23 02/10/2008, 01/23/2014  . Tdap 07/19/2011    Screening Tests Health Maintenance  Topic Date Due  . MAMMOGRAM  07/18/2020 (Originally 03/24/2011)  . Hepatitis C Screening  04/22/2023 (Originally 1945/05/11)  . INFLUENZA VACCINE  10/18/2017  . HEMOGLOBIN A1C  02/28/2018  . OPHTHALMOLOGY EXAM  05/19/2018  . FOOT EXAM  09/01/2018  . TETANUS/TDAP  07/18/2021  . COLONOSCOPY  03/24/2024  . DEXA SCAN  Completed  . PNA vac Low Risk Adult  Completed      Plan:     I have personally reviewed, addressed, and noted the following in the patient's chart:  A. Medical and social history B. Use of alcohol, tobacco or illicit drugs  C. Current medications and supplements D. Functional ability and status E.  Nutritional status F.  Physical activity G. Advance directives H. List of other physicians I.  Hospitalizations, surgeries, and ER visits in previous 12 months J.  [place] to include hearing, vision, cognitive, depression L. Referrals and appointments - none  In addition, I have reviewed and discussed with patient certain preventive protocols, quality metrics, and best practice recommendations. A written personalized care plan for preventive services as well as general preventive health recommendations were provided to patient.  See attached scanned questionnaire for additional information.   Signed,   Lindell Noe, MHA, BS, LPN Health Coach

## 2017-08-31 NOTE — Assessment & Plan Note (Deleted)
Reviewed health habits including diet and exercise and skin cancer prevention Reviewed appropriate screening tests for age  Also reviewed health mt list, fam hx and immunization status , as well as social and family history   See HPI amw rev  Recommend vit D for bone health Disc shingrix vaccine

## 2017-08-31 NOTE — Assessment & Plan Note (Signed)
Lab Results  Component Value Date   VITAMINB12 237 08/29/2017   Recommend starting 1000 mcg B12 daily

## 2017-08-31 NOTE — Assessment & Plan Note (Signed)
Disc goals for lipids and reasons to control them Rev last labs with pt Rev low sat fat diet in detail  Intol of statin  Continues zetia and good diet

## 2017-09-04 DIAGNOSIS — M545 Low back pain: Secondary | ICD-10-CM | POA: Diagnosis not present

## 2017-09-04 DIAGNOSIS — M4726 Other spondylosis with radiculopathy, lumbar region: Secondary | ICD-10-CM | POA: Diagnosis not present

## 2017-09-12 DIAGNOSIS — M545 Low back pain: Secondary | ICD-10-CM | POA: Diagnosis not present

## 2017-09-12 DIAGNOSIS — R262 Difficulty in walking, not elsewhere classified: Secondary | ICD-10-CM | POA: Diagnosis not present

## 2017-09-26 DIAGNOSIS — R262 Difficulty in walking, not elsewhere classified: Secondary | ICD-10-CM | POA: Diagnosis not present

## 2017-09-26 DIAGNOSIS — M545 Low back pain: Secondary | ICD-10-CM | POA: Diagnosis not present

## 2017-09-28 DIAGNOSIS — R262 Difficulty in walking, not elsewhere classified: Secondary | ICD-10-CM | POA: Diagnosis not present

## 2017-09-28 DIAGNOSIS — M545 Low back pain: Secondary | ICD-10-CM | POA: Diagnosis not present

## 2017-10-02 DIAGNOSIS — R262 Difficulty in walking, not elsewhere classified: Secondary | ICD-10-CM | POA: Diagnosis not present

## 2017-10-02 DIAGNOSIS — M545 Low back pain: Secondary | ICD-10-CM | POA: Diagnosis not present

## 2017-10-04 DIAGNOSIS — R262 Difficulty in walking, not elsewhere classified: Secondary | ICD-10-CM | POA: Diagnosis not present

## 2017-10-04 DIAGNOSIS — M545 Low back pain: Secondary | ICD-10-CM | POA: Diagnosis not present

## 2017-10-09 DIAGNOSIS — H34832 Tributary (branch) retinal vein occlusion, left eye, with macular edema: Secondary | ICD-10-CM | POA: Diagnosis not present

## 2017-10-09 DIAGNOSIS — H3562 Retinal hemorrhage, left eye: Secondary | ICD-10-CM | POA: Diagnosis not present

## 2017-10-09 DIAGNOSIS — H35032 Hypertensive retinopathy, left eye: Secondary | ICD-10-CM | POA: Diagnosis not present

## 2017-10-09 DIAGNOSIS — E113393 Type 2 diabetes mellitus with moderate nonproliferative diabetic retinopathy without macular edema, bilateral: Secondary | ICD-10-CM | POA: Diagnosis not present

## 2017-10-09 DIAGNOSIS — H3582 Retinal ischemia: Secondary | ICD-10-CM | POA: Diagnosis not present

## 2017-10-31 DIAGNOSIS — R262 Difficulty in walking, not elsewhere classified: Secondary | ICD-10-CM | POA: Diagnosis not present

## 2017-10-31 DIAGNOSIS — M545 Low back pain: Secondary | ICD-10-CM | POA: Diagnosis not present

## 2017-11-06 DIAGNOSIS — M545 Low back pain: Secondary | ICD-10-CM | POA: Diagnosis not present

## 2017-11-13 DIAGNOSIS — R262 Difficulty in walking, not elsewhere classified: Secondary | ICD-10-CM | POA: Diagnosis not present

## 2017-11-13 DIAGNOSIS — M545 Low back pain: Secondary | ICD-10-CM | POA: Diagnosis not present

## 2017-11-21 DIAGNOSIS — R262 Difficulty in walking, not elsewhere classified: Secondary | ICD-10-CM | POA: Diagnosis not present

## 2017-11-21 DIAGNOSIS — M545 Low back pain: Secondary | ICD-10-CM | POA: Diagnosis not present

## 2017-11-23 DIAGNOSIS — R262 Difficulty in walking, not elsewhere classified: Secondary | ICD-10-CM | POA: Diagnosis not present

## 2017-11-23 DIAGNOSIS — M545 Low back pain: Secondary | ICD-10-CM | POA: Diagnosis not present

## 2017-11-27 DIAGNOSIS — H3562 Retinal hemorrhage, left eye: Secondary | ICD-10-CM | POA: Diagnosis not present

## 2017-11-27 DIAGNOSIS — H35032 Hypertensive retinopathy, left eye: Secondary | ICD-10-CM | POA: Diagnosis not present

## 2017-11-27 DIAGNOSIS — E113393 Type 2 diabetes mellitus with moderate nonproliferative diabetic retinopathy without macular edema, bilateral: Secondary | ICD-10-CM | POA: Diagnosis not present

## 2017-11-27 DIAGNOSIS — H35352 Cystoid macular degeneration, left eye: Secondary | ICD-10-CM | POA: Diagnosis not present

## 2017-11-27 DIAGNOSIS — H34832 Tributary (branch) retinal vein occlusion, left eye, with macular edema: Secondary | ICD-10-CM | POA: Diagnosis not present

## 2017-11-30 DIAGNOSIS — R262 Difficulty in walking, not elsewhere classified: Secondary | ICD-10-CM | POA: Diagnosis not present

## 2017-11-30 DIAGNOSIS — M545 Low back pain: Secondary | ICD-10-CM | POA: Diagnosis not present

## 2017-12-04 DIAGNOSIS — M545 Low back pain: Secondary | ICD-10-CM | POA: Diagnosis not present

## 2017-12-04 DIAGNOSIS — R262 Difficulty in walking, not elsewhere classified: Secondary | ICD-10-CM | POA: Diagnosis not present

## 2017-12-13 DIAGNOSIS — R262 Difficulty in walking, not elsewhere classified: Secondary | ICD-10-CM | POA: Diagnosis not present

## 2017-12-13 DIAGNOSIS — M545 Low back pain: Secondary | ICD-10-CM | POA: Diagnosis not present

## 2017-12-27 DIAGNOSIS — M545 Low back pain: Secondary | ICD-10-CM | POA: Diagnosis not present

## 2017-12-27 DIAGNOSIS — R262 Difficulty in walking, not elsewhere classified: Secondary | ICD-10-CM | POA: Diagnosis not present

## 2018-01-02 DIAGNOSIS — M545 Low back pain: Secondary | ICD-10-CM | POA: Diagnosis not present

## 2018-01-03 DIAGNOSIS — E113393 Type 2 diabetes mellitus with moderate nonproliferative diabetic retinopathy without macular edema, bilateral: Secondary | ICD-10-CM | POA: Diagnosis not present

## 2018-01-03 DIAGNOSIS — H34832 Tributary (branch) retinal vein occlusion, left eye, with macular edema: Secondary | ICD-10-CM | POA: Diagnosis not present

## 2018-01-03 DIAGNOSIS — H35032 Hypertensive retinopathy, left eye: Secondary | ICD-10-CM | POA: Diagnosis not present

## 2018-01-03 DIAGNOSIS — H3582 Retinal ischemia: Secondary | ICD-10-CM | POA: Diagnosis not present

## 2018-01-16 DIAGNOSIS — H34832 Tributary (branch) retinal vein occlusion, left eye, with macular edema: Secondary | ICD-10-CM | POA: Diagnosis not present

## 2018-02-21 DIAGNOSIS — H35032 Hypertensive retinopathy, left eye: Secondary | ICD-10-CM | POA: Diagnosis not present

## 2018-02-21 DIAGNOSIS — H3582 Retinal ischemia: Secondary | ICD-10-CM | POA: Diagnosis not present

## 2018-02-21 DIAGNOSIS — H34832 Tributary (branch) retinal vein occlusion, left eye, with macular edema: Secondary | ICD-10-CM | POA: Diagnosis not present

## 2018-02-21 DIAGNOSIS — E113393 Type 2 diabetes mellitus with moderate nonproliferative diabetic retinopathy without macular edema, bilateral: Secondary | ICD-10-CM | POA: Diagnosis not present

## 2018-04-11 DIAGNOSIS — H34832 Tributary (branch) retinal vein occlusion, left eye, with macular edema: Secondary | ICD-10-CM | POA: Diagnosis not present

## 2018-04-11 DIAGNOSIS — Z09 Encounter for follow-up examination after completed treatment for conditions other than malignant neoplasm: Secondary | ICD-10-CM | POA: Diagnosis not present

## 2018-04-11 DIAGNOSIS — H35352 Cystoid macular degeneration, left eye: Secondary | ICD-10-CM | POA: Diagnosis not present

## 2018-05-20 ENCOUNTER — Ambulatory Visit (INDEPENDENT_AMBULATORY_CARE_PROVIDER_SITE_OTHER): Payer: Medicare Other | Admitting: Family Medicine

## 2018-05-20 ENCOUNTER — Encounter: Payer: Self-pay | Admitting: Family Medicine

## 2018-05-20 VITALS — BP 122/68 | HR 91 | Temp 97.9°F | Ht 63.5 in | Wt 154.3 lb

## 2018-05-20 DIAGNOSIS — E1165 Type 2 diabetes mellitus with hyperglycemia: Secondary | ICD-10-CM

## 2018-05-20 DIAGNOSIS — D649 Anemia, unspecified: Secondary | ICD-10-CM

## 2018-05-20 DIAGNOSIS — E538 Deficiency of other specified B group vitamins: Secondary | ICD-10-CM | POA: Diagnosis not present

## 2018-05-20 DIAGNOSIS — L723 Sebaceous cyst: Secondary | ICD-10-CM | POA: Diagnosis not present

## 2018-05-20 DIAGNOSIS — K625 Hemorrhage of anus and rectum: Secondary | ICD-10-CM | POA: Insufficient documentation

## 2018-05-20 DIAGNOSIS — E039 Hypothyroidism, unspecified: Secondary | ICD-10-CM | POA: Diagnosis not present

## 2018-05-20 DIAGNOSIS — D696 Thrombocytopenia, unspecified: Secondary | ICD-10-CM

## 2018-05-20 DIAGNOSIS — K5520 Angiodysplasia of colon without hemorrhage: Secondary | ICD-10-CM | POA: Diagnosis not present

## 2018-05-20 DIAGNOSIS — E1140 Type 2 diabetes mellitus with diabetic neuropathy, unspecified: Secondary | ICD-10-CM

## 2018-05-20 DIAGNOSIS — C50911 Malignant neoplasm of unspecified site of right female breast: Secondary | ICD-10-CM

## 2018-05-20 DIAGNOSIS — K582 Mixed irritable bowel syndrome: Secondary | ICD-10-CM

## 2018-05-20 DIAGNOSIS — I10 Essential (primary) hypertension: Secondary | ICD-10-CM | POA: Diagnosis not present

## 2018-05-20 DIAGNOSIS — R5383 Other fatigue: Secondary | ICD-10-CM | POA: Insufficient documentation

## 2018-05-20 DIAGNOSIS — E785 Hyperlipidemia, unspecified: Secondary | ICD-10-CM

## 2018-05-20 DIAGNOSIS — R5382 Chronic fatigue, unspecified: Secondary | ICD-10-CM

## 2018-05-20 DIAGNOSIS — Z170 Estrogen receptor positive status [ER+]: Secondary | ICD-10-CM

## 2018-05-20 DIAGNOSIS — E1142 Type 2 diabetes mellitus with diabetic polyneuropathy: Secondary | ICD-10-CM

## 2018-05-20 HISTORY — DX: Other fatigue: R53.83

## 2018-05-20 LAB — CBC WITH DIFFERENTIAL/PLATELET
BASOS ABS: 0 10*3/uL (ref 0.0–0.1)
Basophils Relative: 0.5 % (ref 0.0–3.0)
Eosinophils Absolute: 0.1 10*3/uL (ref 0.0–0.7)
Eosinophils Relative: 1 % (ref 0.0–5.0)
HCT: 38.1 % (ref 36.0–46.0)
Hemoglobin: 12.7 g/dL (ref 12.0–15.0)
LYMPHS ABS: 1.6 10*3/uL (ref 0.7–4.0)
Lymphocytes Relative: 28.1 % (ref 12.0–46.0)
MCHC: 33.4 g/dL (ref 30.0–36.0)
MCV: 81.8 fl (ref 78.0–100.0)
MONOS PCT: 8 % (ref 3.0–12.0)
Monocytes Absolute: 0.5 10*3/uL (ref 0.1–1.0)
NEUTROS PCT: 62.4 % (ref 43.0–77.0)
Neutro Abs: 3.6 10*3/uL (ref 1.4–7.7)
Platelets: 121 10*3/uL — ABNORMAL LOW (ref 150.0–400.0)
RBC: 4.66 Mil/uL (ref 3.87–5.11)
RDW: 15.4 % (ref 11.5–15.5)
WBC: 5.8 10*3/uL (ref 4.0–10.5)

## 2018-05-20 LAB — COMPREHENSIVE METABOLIC PANEL
ALT: 25 U/L (ref 0–35)
AST: 34 U/L (ref 0–37)
Albumin: 4.4 g/dL (ref 3.5–5.2)
Alkaline Phosphatase: 73 U/L (ref 39–117)
BUN: 18 mg/dL (ref 6–23)
CO2: 29 mEq/L (ref 19–32)
Calcium: 9.8 mg/dL (ref 8.4–10.5)
Chloride: 102 mEq/L (ref 96–112)
Creatinine, Ser: 1.05 mg/dL (ref 0.40–1.20)
GFR: 51.7 mL/min — AB (ref >60.00)
Glucose, Bld: 168 mg/dL — ABNORMAL HIGH (ref 70–99)
Potassium: 4.1 mEq/L (ref 3.5–5.1)
SODIUM: 140 meq/L (ref 135–145)
TOTAL PROTEIN: 7.9 g/dL (ref 6.0–8.3)
Total Bilirubin: 0.4 mg/dL (ref 0.2–1.2)

## 2018-05-20 LAB — VITAMIN B12: Vitamin B-12: 341 pg/mL (ref 211–911)

## 2018-05-20 LAB — LIPID PANEL
CHOL/HDL RATIO: 6
Cholesterol: 171 mg/dL (ref 0–200)
HDL: 29.3 mg/dL — AB (ref >39.00)
Triglycerides: 539 mg/dL — ABNORMAL HIGH (ref 0.0–149.0)

## 2018-05-20 LAB — TSH: TSH: 3.67 u[IU]/mL (ref 0.35–4.50)

## 2018-05-20 LAB — FERRITIN: FERRITIN: 21 ng/mL (ref 10.0–291.0)

## 2018-05-20 LAB — LDL CHOLESTEROL, DIRECT: Direct LDL: 95 mg/dL

## 2018-05-20 LAB — HEMOGLOBIN A1C: HEMOGLOBIN A1C: 8.4 % — AB (ref 4.6–6.5)

## 2018-05-20 NOTE — Assessment & Plan Note (Signed)
Lab today  H/o GI AVMs Also more fatigue  Also B12 def Lab today

## 2018-05-20 NOTE — Assessment & Plan Note (Signed)
bp in fair control at this time  BP Readings from Last 1 Encounters:  05/20/18 122/68   No changes needed Most recent labs reviewed  Disc lifstyle change with low sodium diet and exercise

## 2018-05-20 NOTE — Assessment & Plan Note (Signed)
Intol of statin  On zetia  Lipid panel today

## 2018-05-20 NOTE — Assessment & Plan Note (Signed)
More fatigue lately  TSH today

## 2018-05-20 NOTE — Assessment & Plan Note (Signed)
A1C today -overdue  Has not been checking glucose or watching diet

## 2018-05-20 NOTE — Assessment & Plan Note (Signed)
Chronic but worse in past month  Possibly multifactorial  Taking B12 Iron in her mvi -h/o anemia and GI bleed  Labs today  Also overdue for A1C-diabetes may add to this  Hypothyroid-tsh today (takex 50 mcg levothy)

## 2018-05-20 NOTE — Assessment & Plan Note (Signed)
R shoulder -getting larger and causing problems with clothing  Pt wants to make her own derm appt with Dr Nicole Kindred Will call us if she needs ref for her ins

## 2018-05-20 NOTE — Assessment & Plan Note (Signed)
Still bothersome May need to get more aggressive with diabetes

## 2018-05-20 NOTE — Assessment & Plan Note (Signed)
No re occurrences

## 2018-05-20 NOTE — Progress Notes (Signed)
Subjective:    Patient ID: Valerie Ball, female    DOB: [birth year], 73 y.o.   MRN: [id number]  HPI Here with fatigue  Other issues as well  Wt Readings from Last 3 Encounters:  05/20/18 154 lb 5 oz (70 kg)  08/31/17 154 lb 4 oz (70 kg)  08/31/17 154 lb 4 oz (70 kg)   26.91 kg/m   Energy level has been worse lately  "a zombie" for 2-3 weeks     bp is stable today  No cp or palpitations or headaches or edema  No side effects to medicines    Hypothyroidism Lab Results  Component Value Date   TSH 1.69 08/29/2017    DM2 Lab Results  Component Value Date   HGBA1C 7.5 (H) 08/29/2017  she thinks her DM is a little worse  Got off track with eating at holidays -never got back on track  In general eating less because of GI issues More takeout lately  occ glucose levels 160 last checking /fasting    Lab Results  Component Value Date   VITAMINB12 237 08/29/2017   Oral supplementation - taking daily 1000 mcg   GI issues - continue to bother her   Anemia - past GI bleed/avms  Lab Results  Component Value Date   WBC 3.6 (L) 08/29/2017   HGB 11.7 (L) 08/29/2017   HCT 35.1 (L) 08/29/2017   MCV 82.7 08/29/2017   PLT 112.0 (L) 08/29/2017   last colonoscopy 2016  Still having diarrhea and bleeding rectally  Has never gone away - worse lately  Sometimes bright red  occ darker  Lower abd pain - happens after constipation  Still has alt diarrhea and constipation  No nsaids at all  Her mvi has iron in it    Patient Active Problem List   Diagnosis Date Noted  . Rectal bleeding 05/20/2018  . Fatigue 05/20/2018  . Sebaceous cyst 05/20/2018  . Hip pain, bilateral 08/31/2017  . Vitamin B12 deficiency 09/21/2016  . Benign neoplasm of tongue 04/11/2016  . Diabetic gastroparesis ([place]) 03/27/2016  . Retinal vein occlusion, branch 01/03/2016  . Alternating constipation and diarrhea 08/12/2015  . AVM (arteriovenous malformation) of colon 05/18/2015  . IBS (irritable  bowel syndrome) 02/28/2015  . Cough 12/08/2014  . Diverticulitis of colon 10/14/2014  . Gout 07/22/2014  . Hypothyroidism 04/23/2014  . Nausea without vomiting 02/18/2014  . Encounter for Medicare annual wellness exam 01/23/2014  . Estrogen deficiency 01/23/2014  . Colon cancer screening 01/23/2014  . Abdominal pain, epigastric 01/20/2014  . Tremor 01/20/2014  . RUQ abdominal pain 01/20/2014  . Dizziness 01/07/2014  . Allergic rhinitis 01/07/2014  . Thrombocytopenia ([place]) 11/25/2013  . Diabetic neuropathy ([place]) 11/25/2013  . History of breast cancer 06/07/2012  . Lap Nissen October 2013 01/19/2012  . Large type III mixed hiatus hernia  12/06/2011  . Stress reaction 10/25/2011  . Routine general medical examination at a health care facility 07/11/2011  . ADVERSE DRUG REACTION 05/05/2010  . TRANSAMINASES, SERUM, ELEVATED 11/11/2009  . Breast cancer, right breast ([place]) 08/02/2009  . DIVERTICULOSIS-COLON 03/03/2008  . ESOPHAGEAL STRICTURE 03/02/2008  . HIATAL HERNIA 03/02/2008  . PERITONEAL ADHESIONS 03/02/2008  . Anemia 02/07/2008  . Poorly controlled type 2 diabetes mellitus with neuropathy ([place]) 08/19/2007  . ANXIETY 04/15/2007  . DISORDERS, ORGANIC SLEEP NEC 08/23/2006  . SYNDROME, RESTLESS LEGS 08/23/2006  . SYNDROME, CARPAL TUNNEL 08/23/2006  . Hyperlipidemia LDL goal <100 08/22/2006  . Essential hypertension 08/22/2006  .  ALLERGIC RHINITIS 08/22/2006  . GERD 08/22/2006  . Fatty liver 08/22/2006  . History of seizure 08/22/2006   Past Medical History:  Diagnosis Date  . Allergy    allegic rhinitis  . Anemia   . Angiodysplasia of colon   . Anxiety   . Arthritis   . Blood transfusion   . Borderline diabetic    per patient medical history form  . Breast cancer ([place]) 02/2009   breast CA L invasive ductal CA(hormone receptor positive.)  . Cataract   . Colon polyp    hyperplastic  . Colon stricture ([place]) 01/14/2003  . Diabetes mellitus    pt's medical doctor  took her off diabetic meds-blood sugars have not been a problem--and medications were causing pt's sugars to be too low  . Diverticulosis   . Esophageal stricture 02/04/2008  . GERD (gastroesophageal reflux disease)   . GI bleed   . Hepatic steatosis   . Hiatal hernia   . Hyperlipidemia   . Hypertension   . Hypothyroidism   . IBS (irritable bowel syndrome)   . Melena 01/2008   anemia transfusion  . Menopause    per patient medical history form  . Osteoporosis   . Postmenopausal hormone therapy    per patient medical history form.  Marland Kitchen RLS (restless legs syndrome)   . Salmonella 05/2000   renal effects secondary to dehydration  . Seizure disorder ([place])   . Seizures ([place])    one seizure several yrs ago --etiology unknown-no problem since   Past Surgical History:  Procedure Laterality Date  . ABDOMINAL HYSTERECTOMY  1983  . BREAST SURGERY  02/2009   breast biopsy invasive ductal CA-bilateral mastectomies  . EPIGASTRIC HERNIA REPAIR  01/02/2012   Procedure: HERNIA REPAIR EPIGASTRIC ADULT;  Surgeon: Pedro Earls, MD;  Location: [facility];  Service: General;  Laterality: N/A;  Laparoscopic Repair Paraesophageal Hernia, Nissen  . EYE SURGERY Left 07/2017   Dr. Zadie Rhine  . LAPAROSCOPIC NISSEN FUNDOPLICATION  [id number]   Procedure: LAPAROSCOPIC NISSEN FUNDOPLICATION;  Surgeon: Pedro Earls, MD;  Location: [facility];  Service: General;  Laterality: N/A;  . MASTECTOMY  04/2009   Bilateral for ductal carcinoma on L  . OVARY SURGERY  1986   ovarian tumor removed   Social History   Tobacco Use  . Smoking status: Never Smoker  . Smokeless tobacco: Never Used  Substance Use Topics  . Alcohol use: No    Alcohol/week: 0.0 standard drinks  . Drug use: No   Family History  Problem Relation Age of Onset  . Heart attack Father   . Diabetes Father   . Esophageal cancer Father   . Breast cancer Sister        x 2 sisters  . Diabetes Sister   . Hypertension Brother   . Diabetes  Brother   . Hypertension Mother   . Stroke Mother   . Breast cancer Mother   . Hypertension Sister    Allergies  Allergen Reactions  . Arimidex [Anastrozole]     Joint pain  . Glipizide Other (See Comments)    Hypoglycemia   . Metformin And Related     diarrhea  . Penicillins Rash   Current Outpatient Medications on File Prior to Visit  Medication Sig Dispense Refill  . amLODipine (NORVASC) 5 MG tablet Take 1 tablet (5 mg total) by mouth daily. 90 tablet 3  . aspirin EC 81 MG tablet Take 81 mg by mouth daily.    Marland Kitchen  ezetimibe (ZETIA) 10 MG tablet Take 1 tablet (10 mg total) by mouth daily. 90 tablet 3  . fluticasone (FLONASE) 50 MCG/ACT nasal spray Place 2 sprays into both nostrils daily as needed. Allergies 48 g 3  . glucose blood (FREESTYLE LITE) test strip Use to check blood sugar once daily and as needed (diagnosis E11.65) 100 each 1  . hydrochlorothiazide (HYDRODIURIL) 25 MG tablet Take 1 tablet (25 mg total) by mouth daily. 90 tablet 3  . levothyroxine (SYNTHROID, LEVOTHROID) 50 MCG tablet TAKE 1 TABLET BY MOUTH ONCE DAILY BEFORE BREAKFAST 90 tablet 3  . losartan (COZAAR) 100 MG tablet Take 1 tablet (100 mg total) by mouth daily. 90 tablet 3  . metFORMIN (GLUCOPHAGE-XR) 500 MG 24 hr tablet Take 3 tablets (1,500 mg total) by mouth daily with breakfast. 270 tablet 3  . Multiple Vitamin (MULTIVITAMIN WITH MINERALS) TABS tablet Take 1 tablet by mouth daily.    Marland Kitchen omeprazole (PRILOSEC) 20 MG capsule TAKE ONE CAPSULE BY MOUTH TWICE DAILY BEFORE MEAL(S) 180 capsule 3  . rOPINIRole (REQUIP) 1 MG tablet Take 1 tablet (1 mg total) by mouth at bedtime. 90 tablet 3  . sertraline (ZOLOFT) 100 MG tablet Take 1 tablet (100 mg total) by mouth at bedtime. 90 tablet 3  . [DISCONTINUED] cetirizine (ZYRTEC) 10 MG tablet Take 10 mg by mouth daily as needed. For allergy symptoms     No current facility-administered medications on file prior to visit.     Review of Systems  Constitutional: Positive  for fatigue. Negative for activity change, appetite change, chills, diaphoresis, fever and unexpected weight change.  HENT: Negative for congestion, ear pain, rhinorrhea, sinus pressure and sore throat.   Eyes: Negative for pain, redness and visual disturbance.  Respiratory: Negative for cough, shortness of breath and wheezing.   Cardiovascular: Negative for chest pain, palpitations and leg swelling.  Gastrointestinal: Positive for abdominal pain, anal bleeding, constipation, diarrhea and nausea. Negative for blood in stool, rectal pain and vomiting.  Endocrine: Negative for polydipsia and polyuria.  Genitourinary: Negative for dysuria, frequency and urgency.  Musculoskeletal: Positive for arthralgias. Negative for back pain and myalgias.  Skin: Negative for pallor and rash.  Allergic/Immunologic: Negative for environmental allergies.  Neurological: Negative for dizziness, seizures, syncope, facial asymmetry, speech difficulty, light-headedness, numbness and headaches.       Peripheral neuropathy  Hematological: Negative for adenopathy. Does not bruise/bleed easily.  Psychiatric/Behavioral: Positive for decreased concentration. Negative for dysphoric mood. The patient is not nervous/anxious.        Objective:   Physical Exam Constitutional:      General: She is not in acute distress.    Appearance: Normal appearance. She is well-developed and normal weight. She is not ill-appearing or diaphoretic.     Comments: Appears fatigued   HENT:     Head: Normocephalic and atraumatic.     Right Ear: Tympanic membrane and ear canal normal.     Left Ear: Tympanic membrane and ear canal normal.     Nose: Nose normal.     Mouth/Throat:     Mouth: Mucous membranes are moist.     Pharynx: Oropharynx is clear. No posterior oropharyngeal erythema.  Eyes:     General: No scleral icterus.    Conjunctiva/sclera: Conjunctivae normal.     Pupils: Pupils are equal, round, and reactive to light.  Neck:      Musculoskeletal: Normal range of motion and neck supple.     Thyroid: No thyromegaly.  Vascular: No carotid bruit or JVD.  Cardiovascular:     Rate and Rhythm: Regular rhythm. Tachycardia present.     Pulses: Normal pulses.     Heart sounds: Normal heart sounds. No gallop.   Pulmonary:     Effort: Pulmonary effort is normal. No respiratory distress.     Breath sounds: Normal breath sounds. No wheezing or rales.  Abdominal:     General: Abdomen is flat. Bowel sounds are normal. There is no distension or abdominal bruit.     Palpations: Abdomen is soft. There is no mass.     Tenderness: There is no abdominal tenderness. There is no right CVA tenderness, guarding or rebound.     Hernia: No hernia is present.  Genitourinary:    Rectum: Normal. Guaiac result negative.     Comments: External hemorrhoids noted-no bleeding or thrombosed ones Nl rectal tone Heme neg stool No rectal masses Musculoskeletal:     Right lower leg: No edema.     Left lower leg: No edema.  Lymphadenopathy:     Cervical: No cervical adenopathy.  Skin:    General: Skin is warm and dry.     Coloration: Skin is not jaundiced or pale.     Findings: No rash.  Neurological:     Mental Status: She is alert. Mental status is at baseline.     Cranial Nerves: No cranial nerve deficit.     Coordination: Coordination normal.     Deep Tendon Reflexes: Reflexes are normal and symmetric. Reflexes normal.  Psychiatric:        Mood and Affect: Affect is blunt.        Speech: Speech normal.        Behavior: Behavior is slowed.        Thought Content: Thought content normal.     Comments: Blunted affect  Seems fatigued and slowed            Assessment & Plan:   Problem List Items Addressed This Visit      Cardiovascular and Mediastinum   Essential hypertension    bp in fair control at this time  BP Readings from Last 1 Encounters:  05/20/18 122/68   No changes needed Most recent labs reviewed  Disc  lifstyle change with low sodium diet and exercise        Relevant Orders   Comprehensive metabolic panel (Completed)   AVM (arteriovenous malformation) of colon    Now having intermittent brb rectal  More fatigued On iron only in her mvi  Lab today  GI ref for f/u made         Digestive   IBS (irritable bowel syndrome)    Ongoing diarrhea/constipation make it difficult for her to eat at times Planning GI f/u      Rectal bleeding - Primary    Pt has hx of hemorrhoids and also colonic AVMs with chronic GI bleed Stool is heme neg today (problem is intermittent) Also fatigue Lab today  GI ref made       Relevant Orders   Ambulatory referral to Gastroenterology     Endocrine   Poorly controlled type 2 diabetes mellitus with neuropathy ([place])    A1C today -overdue  Has not been checking glucose or watching diet       Relevant Orders   Hemoglobin A1c (Completed)   Diabetic neuropathy ([place])    Still bothersome May need to get more aggressive with diabetes      Hypothyroidism  More fatigue lately  TSH today      Relevant Orders   TSH (Completed)     Musculoskeletal and Integument   Sebaceous cyst    R shoulder -getting larger and causing problems with clothing  Pt wants to make her own derm appt with Dr Nicole Kindred Will call us if she needs ref for her ins        Other   Breast cancer, right breast ([place])    No re occurrences       Hyperlipidemia LDL goal <100    Intol of statin  On zetia  Lipid panel today      Relevant Orders   Lipid panel (Completed)   Anemia    Lab today  H/o GI AVMs Also more fatigue  Also B12 def Lab today      Relevant Orders   CBC with Differential/Platelet (Completed)   Ferritin (Completed)   Thrombocytopenia ([place])    Cbc today Has had some intermittent rectal bleeding       Relevant Orders   CBC with Differential/Platelet (Completed)   Vitamin B12 deficiency    Level today  Takes 1000 mcg oral daily  More  fatigued       Relevant Orders   Vitamin B12 (Completed)   Fatigue    Chronic but worse in past month  Possibly multifactorial  Taking B12 Iron in her mvi -h/o anemia and GI bleed  Labs today  Also overdue for A1C-diabetes may add to this  Hypothyroid-tsh today (takex 50 mcg levothy)      Relevant Orders   CBC with Differential/Platelet (Completed)   Comprehensive metabolic panel (Completed)   TSH (Completed)

## 2018-05-20 NOTE — Assessment & Plan Note (Signed)
Pt has hx of hemorrhoids and also colonic AVMs with chronic GI bleed Stool is heme neg today (problem is intermittent) Also fatigue Lab today  GI ref made

## 2018-05-20 NOTE — Patient Instructions (Signed)
Labs today   Stool was heme negative today  We will call you about a GI referral  Let me know when /if you want to see dermatology for the seb cyst on your shoulder   Alert [place] if symptoms suddenly worsen   Try to stick to diabetic diet  Try to get most of your carbohydrates from produce (with the exception of white potatoes)  Eat less bread/pasta/rice/snack foods/cereals/sweets and other items from the middle of the grocery store (processed carbs)

## 2018-05-20 NOTE — Assessment & Plan Note (Signed)
Ongoing diarrhea/constipation make it difficult for her to eat at times Planning GI f/u

## 2018-05-20 NOTE — Assessment & Plan Note (Signed)
Cbc today Has had some intermittent rectal bleeding

## 2018-05-20 NOTE — Assessment & Plan Note (Signed)
Now having intermittent brb rectal  More fatigued On iron only in her mvi  Lab today  GI ref for f/u made

## 2018-05-20 NOTE — Assessment & Plan Note (Signed)
Level today  Takes 1000 mcg oral daily  More fatigued

## 2018-05-30 DIAGNOSIS — H34832 Tributary (branch) retinal vein occlusion, left eye, with macular edema: Secondary | ICD-10-CM | POA: Diagnosis not present

## 2018-05-30 DIAGNOSIS — H3562 Retinal hemorrhage, left eye: Secondary | ICD-10-CM | POA: Diagnosis not present

## 2018-05-30 DIAGNOSIS — H35352 Cystoid macular degeneration, left eye: Secondary | ICD-10-CM | POA: Diagnosis not present

## 2018-05-30 DIAGNOSIS — H35032 Hypertensive retinopathy, left eye: Secondary | ICD-10-CM | POA: Diagnosis not present

## 2018-05-30 DIAGNOSIS — E113393 Type 2 diabetes mellitus with moderate nonproliferative diabetic retinopathy without macular edema, bilateral: Secondary | ICD-10-CM | POA: Diagnosis not present

## 2018-05-30 LAB — HM DIABETES EYE EXAM

## 2018-05-31 ENCOUNTER — Telehealth: Payer: Self-pay | Admitting: *Deleted

## 2018-05-31 NOTE — Telephone Encounter (Signed)
Per Dr. Glori Bickers due to the Covid 19 pt is one of the appts she wants to cancel for now. I left VM letting pt know and if she calls back please cancel her appt,.

## 2018-06-04 ENCOUNTER — Ambulatory Visit (INDEPENDENT_AMBULATORY_CARE_PROVIDER_SITE_OTHER): Payer: Medicare Other | Admitting: Family Medicine

## 2018-06-04 ENCOUNTER — Other Ambulatory Visit: Payer: Self-pay

## 2018-06-04 ENCOUNTER — Encounter: Payer: Self-pay | Admitting: Family Medicine

## 2018-06-04 ENCOUNTER — Telehealth: Payer: Self-pay | Admitting: Family Medicine

## 2018-06-04 VITALS — BP 131/80 | HR 93 | Temp 97.9°F | Ht 63.5 in | Wt 154.3 lb

## 2018-06-04 DIAGNOSIS — K3184 Gastroparesis: Secondary | ICD-10-CM | POA: Diagnosis not present

## 2018-06-04 DIAGNOSIS — E1143 Type 2 diabetes mellitus with diabetic autonomic (poly)neuropathy: Secondary | ICD-10-CM | POA: Diagnosis not present

## 2018-06-04 DIAGNOSIS — E1165 Type 2 diabetes mellitus with hyperglycemia: Secondary | ICD-10-CM | POA: Diagnosis not present

## 2018-06-04 DIAGNOSIS — E1140 Type 2 diabetes mellitus with diabetic neuropathy, unspecified: Secondary | ICD-10-CM

## 2018-06-04 DIAGNOSIS — E1169 Type 2 diabetes mellitus with other specified complication: Secondary | ICD-10-CM

## 2018-06-04 DIAGNOSIS — E785 Hyperlipidemia, unspecified: Secondary | ICD-10-CM

## 2018-06-04 DIAGNOSIS — I10 Essential (primary) hypertension: Secondary | ICD-10-CM | POA: Diagnosis not present

## 2018-06-04 MED ORDER — SITAGLIPTIN PHOSPHATE 100 MG PO TABS: 100.0000 mg | ORAL_TABLET | Freq: Every day | ORAL | 11 refills | Status: DC

## 2018-06-04 NOTE — Assessment & Plan Note (Addendum)
Lab Results  Component Value Date   HGBA1C 8.4 (H) 05/20/2018   Rev glucose readings and diet  Doing better but needs addn coverage Continue metformin xr  Add januvia 100 mg(handout given to read and disc poss side eff)-inst to hold it and call if any problems  Rev DM diet-she will review her old DM ed info Sent for last DM eye exam (per pt she goes every 2 weeks recently)  On arb  Cannot take statins unfortunately  F/u 3 mo (June as planned)

## 2018-06-04 NOTE — Patient Instructions (Addendum)
Try to get most of your carbohydrates from produce (with the exception of white potatoes)  Eat less bread/pasta/rice/snack foods/cereals/sweets and other items from the middle of the grocery store (processed carbs)   Keep brainstorming exercise you can do at home or outdoors Continue your back exercises/stretches   Continue metformin  Add januvia 100 mg once daily  If any side effects-stop it and call us  Take care of yourself

## 2018-06-04 NOTE — Telephone Encounter (Signed)
This includes Jacques Navy that was ordered today. It was $1700 at [facility]

## 2018-06-04 NOTE — Telephone Encounter (Signed)
Pt is requesting to transfer all meds prescribed by Dr Glori Bickers sent to [facility]. Please call with any questions.

## 2018-06-04 NOTE — Assessment & Plan Note (Signed)
Disc goals for lipids and reasons to control them Rev last labs with pt Rev low sat fat diet in detail  Intol of statins  Trig is up (poss due to high glucose) Re check in June  On zetia

## 2018-06-04 NOTE — Progress Notes (Signed)
Subjective:    Patient ID: Valerie Ball, female    DOB: [birth year], 73 y.o.   MRN: [id number]  HPI Here for diabetes follow up   Wt Readings from Last 3 Encounters:  06/04/18 154 lb 5 oz (70 kg)  05/20/18 154 lb 5 oz (70 kg)  08/31/17 154 lb 4 oz (70 kg)   26.91 kg/m   bp is stable today  No cp or palpitations or headaches or edema  No side effects to medicines  BP Readings from Last 3 Encounters:  06/04/18 (!) 146/78  05/20/18 122/68  08/31/17 124/86    2nd bp was 131/80    Diabetes (with neuropathy and gastroparesis) Home sugar results  Highest 271 (last night) Lowest 131  Mixing up times   Fasting avg about 180       2 h pp (supper) avg around 200 DM diet -has changed her diet  No sweets , has cut back on bread,  White potato products about once per week  Has stopped pasta  Instead of cereal -on pc toast /pb and apple  More fruits and vegetables for her carbs  Has done diabetic teaching  Exercise -has been getting outdoors and walking more (2-3 times per day)  Symptoms A1C last  Lab Results  Component Value Date   HGBA1C 8.4 (H) 05/20/2018   No problems with medications metformin xr 1500 daily  Cannot take glipizide due to hypoglycemia  Renal protection ARB Intolerant of statins  Last eye exam  - sees Dr Zadie Rhine (goes regularly) - gets shots in L eye (thinks retinopathy) -has helped   Cholesterol Lab Results  Component Value Date   CHOL 171 05/20/2018   HDL 29.30 (L) 05/20/2018   LDLDIRECT 95.0 05/20/2018   TRIG (H) 05/20/2018    539.0 Triglyceride is over 400; calculations on Lipids are invalid.   CHOLHDL 6 05/20/2018  zetia  ? Due to sugar    Lab Results  Component Value Date   CREATININE 1.05 05/20/2018   BUN 18 05/20/2018   NA 140 05/20/2018   K 4.1 05/20/2018   CL 102 05/20/2018   CO2 29 05/20/2018   Patient Active Problem List   Diagnosis Date Noted  . Rectal bleeding 05/20/2018  . Fatigue 05/20/2018  . Sebaceous cyst 05/20/2018   . Hip pain, bilateral 08/31/2017  . Vitamin B12 deficiency 09/21/2016  . Benign neoplasm of tongue 04/11/2016  . Diabetic gastroparesis ([place]) 03/27/2016  . Retinal vein occlusion, branch 01/03/2016  . Alternating constipation and diarrhea 08/12/2015  . AVM (arteriovenous malformation) of colon 05/18/2015  . IBS (irritable bowel syndrome) 02/28/2015  . Cough 12/08/2014  . Diverticulitis of colon 10/14/2014  . Gout 07/22/2014  . Hypothyroidism 04/23/2014  . Nausea without vomiting 02/18/2014  . Encounter for Medicare annual wellness exam 01/23/2014  . Estrogen deficiency 01/23/2014  . Colon cancer screening 01/23/2014  . Tremor 01/20/2014  . Dizziness 01/07/2014  . Allergic rhinitis 01/07/2014  . Thrombocytopenia ([place]) 11/25/2013  . Diabetic neuropathy ([place]) 11/25/2013  . History of breast cancer 06/07/2012  . Lap Nissen October 2013 01/19/2012  . Large type III mixed hiatus hernia  12/06/2011  . Stress reaction 10/25/2011  . Routine general medical examination at a health care facility 07/11/2011  . ADVERSE DRUG REACTION 05/05/2010  . TRANSAMINASES, SERUM, ELEVATED 11/11/2009  . Breast cancer, right breast ([place]) 08/02/2009  . DIVERTICULOSIS-COLON 03/03/2008  . ESOPHAGEAL STRICTURE 03/02/2008  . HIATAL HERNIA 03/02/2008  . PERITONEAL ADHESIONS 03/02/2008  .  Anemia 02/07/2008  . Poorly controlled type 2 diabetes mellitus with neuropathy ([place]) 08/19/2007  . ANXIETY 04/15/2007  . DISORDERS, ORGANIC SLEEP NEC 08/23/2006  . SYNDROME, RESTLESS LEGS 08/23/2006  . SYNDROME, CARPAL TUNNEL 08/23/2006  . Hyperlipidemia associated with type 2 diabetes mellitus ([place]) 08/22/2006  . Essential hypertension 08/22/2006  . ALLERGIC RHINITIS 08/22/2006  . GERD 08/22/2006  . Fatty liver 08/22/2006  . History of seizure 08/22/2006   Past Medical History:  Diagnosis Date  . Allergy    allegic rhinitis  . Anemia   . Angiodysplasia of colon   . Anxiety   . Arthritis   . Blood transfusion    . Borderline diabetic    per patient medical history form  . Breast cancer ([place]) 02/2009   breast CA L invasive ductal CA(hormone receptor positive.)  . Cataract   . Colon polyp    hyperplastic  . Colon stricture ([place]) 01/14/2003  . Diabetes mellitus    pt's medical doctor took her off diabetic meds-blood sugars have not been a problem--and medications were causing pt's sugars to be too low  . Diverticulosis   . Esophageal stricture 02/04/2008  . GERD (gastroesophageal reflux disease)   . GI bleed   . Hepatic steatosis   . Hiatal hernia   . Hyperlipidemia   . Hypertension   . Hypothyroidism   . IBS (irritable bowel syndrome)   . Melena 01/2008   anemia transfusion  . Menopause    per patient medical history form  . Osteoporosis   . Postmenopausal hormone therapy    per patient medical history form.  Marland Kitchen RLS (restless legs syndrome)   . Salmonella 05/2000   renal effects secondary to dehydration  . Seizure disorder ([place])   . Seizures ([place])    one seizure several yrs ago --etiology unknown-no problem since   Past Surgical History:  Procedure Laterality Date  . ABDOMINAL HYSTERECTOMY  1983  . BREAST SURGERY  02/2009   breast biopsy invasive ductal CA-bilateral mastectomies  . EPIGASTRIC HERNIA REPAIR  01/02/2012   Procedure: HERNIA REPAIR EPIGASTRIC ADULT;  Surgeon: Pedro Earls, MD;  Location: [facility];  Service: General;  Laterality: N/A;  Laparoscopic Repair Paraesophageal Hernia, Nissen  . EYE SURGERY Left 07/2017   Dr. Zadie Rhine  . LAPAROSCOPIC NISSEN FUNDOPLICATION  [id number]   Procedure: LAPAROSCOPIC NISSEN FUNDOPLICATION;  Surgeon: Pedro Earls, MD;  Location: [facility];  Service: General;  Laterality: N/A;  . MASTECTOMY  04/2009   Bilateral for ductal carcinoma on L  . OVARY SURGERY  1986   ovarian tumor removed   Social History   Tobacco Use  . Smoking status: Never Smoker  . Smokeless tobacco: Never Used  Substance Use Topics  . Alcohol use: No     Alcohol/week: 0.0 standard drinks  . Drug use: No   Family History  Problem Relation Age of Onset  . Heart attack Father   . Diabetes Father   . Esophageal cancer Father   . Breast cancer Sister        x 2 sisters  . Diabetes Sister   . Hypertension Brother   . Diabetes Brother   . Hypertension Mother   . Stroke Mother   . Breast cancer Mother   . Hypertension Sister    Allergies  Allergen Reactions  . Arimidex [Anastrozole]     Joint pain  . Glipizide Other (See Comments)    Hypoglycemia   . Metformin And Related     diarrhea  .  Penicillins Rash   Current Outpatient Medications on File Prior to Visit  Medication Sig Dispense Refill  . amLODipine (NORVASC) 5 MG tablet Take 1 tablet (5 mg total) by mouth daily. 90 tablet 3  . aspirin EC 81 MG tablet Take 81 mg by mouth daily.    Marland Kitchen ezetimibe (ZETIA) 10 MG tablet Take 1 tablet (10 mg total) by mouth daily. 90 tablet 3  . fluticasone (FLONASE) 50 MCG/ACT nasal spray Place 2 sprays into both nostrils daily as needed. Allergies 48 g 3  . glucose blood (FREESTYLE LITE) test strip Use to check blood sugar once daily and as needed (diagnosis E11.65) 100 each 1  . hydrochlorothiazide (HYDRODIURIL) 25 MG tablet Take 1 tablet (25 mg total) by mouth daily. 90 tablet 3  . levothyroxine (SYNTHROID, LEVOTHROID) 50 MCG tablet TAKE 1 TABLET BY MOUTH ONCE DAILY BEFORE BREAKFAST 90 tablet 3  . losartan (COZAAR) 100 MG tablet Take 1 tablet (100 mg total) by mouth daily. 90 tablet 3  . metFORMIN (GLUCOPHAGE-XR) 500 MG 24 hr tablet Take 3 tablets (1,500 mg total) by mouth daily with breakfast. 270 tablet 3  . Multiple Vitamin (MULTIVITAMIN WITH MINERALS) TABS tablet Take 1 tablet by mouth daily.    Marland Kitchen omeprazole (PRILOSEC) 20 MG capsule TAKE ONE CAPSULE BY MOUTH TWICE DAILY BEFORE MEAL(S) 180 capsule 3  . rOPINIRole (REQUIP) 1 MG tablet Take 1 tablet (1 mg total) by mouth at bedtime. 90 tablet 3  . sertraline (ZOLOFT) 100 MG tablet Take 1 tablet  (100 mg total) by mouth at bedtime. 90 tablet 3  . [DISCONTINUED] cetirizine (ZYRTEC) 10 MG tablet Take 10 mg by mouth daily as needed. For allergy symptoms     No current facility-administered medications on file prior to visit.      Review of Systems  Constitutional: Positive for fatigue. Negative for activity change, appetite change, fever and unexpected weight change.  HENT: Negative for congestion, ear pain, rhinorrhea, sinus pressure and sore throat.   Eyes: Negative for pain, redness and visual disturbance.  Respiratory: Negative for cough, shortness of breath and wheezing.   Cardiovascular: Negative for chest pain and palpitations.  Gastrointestinal: Positive for constipation and diarrhea. Negative for abdominal pain, blood in stool and vomiting.       Chronic GI upset with stool changes  Also gastroparesis   Endocrine: Negative for polydipsia and polyuria.  Genitourinary: Negative for dysuria, frequency and urgency.  Musculoskeletal: Negative for arthralgias, back pain and myalgias.  Skin: Negative for pallor and rash.  Allergic/Immunologic: Negative for environmental allergies.  Neurological: Negative for dizziness, syncope and headaches.  Hematological: Negative for adenopathy. Does not bruise/bleed easily.  Psychiatric/Behavioral: Negative for decreased concentration and dysphoric mood. The patient is not nervous/anxious.        Objective:   Physical Exam Constitutional:      General: She is not in acute distress.    Appearance: Normal appearance. She is well-developed and normal weight. She is not ill-appearing.  HENT:     Head: Normocephalic and atraumatic.     Mouth/Throat:     Mouth: Mucous membranes are moist.     Pharynx: Oropharynx is clear.  Eyes:     Conjunctiva/sclera: Conjunctivae normal.     Pupils: Pupils are equal, round, and reactive to light.  Neck:     Musculoskeletal: Normal range of motion and neck supple.     Thyroid: No thyromegaly.      Vascular: No carotid bruit or JVD.  Cardiovascular:  Rate and Rhythm: Regular rhythm. Tachycardia present.     Pulses: Normal pulses.     Heart sounds: Normal heart sounds. No gallop.      Comments: Rate of 93 Pulmonary:     Effort: Pulmonary effort is normal. No respiratory distress.     Breath sounds: Normal breath sounds. No wheezing or rales.  Abdominal:     General: Bowel sounds are normal. There is no distension or abdominal bruit.     Palpations: Abdomen is soft. There is no mass.     Tenderness: There is no abdominal tenderness.  Lymphadenopathy:     Cervical: No cervical adenopathy.  Skin:    General: Skin is warm and dry.     Capillary Refill: Capillary refill takes less than 2 seconds.     Findings: No rash.  Neurological:     Mental Status: She is alert. Mental status is at baseline.     Coordination: Coordination normal.     Deep Tendon Reflexes: Reflexes are normal and symmetric. Reflexes normal.  Psychiatric:        Mood and Affect: Mood normal.           Assessment & Plan:   Problem List Items Addressed This Visit      Cardiovascular and Mediastinum   Essential hypertension    bp in fair control at this time  BP Readings from Last 1 Encounters:  06/04/18 131/80   No changes needed Most recent labs reviewed  Disc lifstyle change with low sodium diet and exercise          Digestive   Diabetic gastroparesis (HCC)    No clinical changes      Relevant Medications   sitaGLIPtin (JANUVIA) 100 MG tablet     Endocrine   Poorly controlled type 2 diabetes mellitus with neuropathy ([place]) - Primary    Lab Results  Component Value Date   HGBA1C 8.4 (H) 05/20/2018   Rev glucose readings and diet  Doing better but needs addn coverage Continue metformin xr  Add januvia 100 mg(handout given to read and disc poss side eff)-inst to hold it and call if any problems  Rev DM diet-she will review her old DM ed info Sent for last DM eye exam (per pt she  goes every 2 weeks recently)  On arb  Cannot take statins unfortunately  F/u 3 mo (June as planned)      Relevant Medications   sitaGLIPtin (JANUVIA) 100 MG tablet   Hyperlipidemia associated with type 2 diabetes mellitus ([place])    Disc goals for lipids and reasons to control them Rev last labs with pt Rev low sat fat diet in detail  Intol of statins  Trig is up (poss due to high glucose) Re check in June  On zetia       Relevant Medications   sitaGLIPtin (JANUVIA) 100 MG tablet

## 2018-06-04 NOTE — Assessment & Plan Note (Signed)
No clinical changes 

## 2018-06-04 NOTE — Assessment & Plan Note (Signed)
bp in fair control at this time  BP Readings from Last 1 Encounters:  06/04/18 131/80   No changes needed Most recent labs reviewed  Disc lifstyle change with low sodium diet and exercise

## 2018-06-05 NOTE — Telephone Encounter (Addendum)
Spoke to patient and was advised that she can not afford the Januvia from [facility]. Advised patient to call her [employer] and find out what the cost would be thru her mail order pharmacy.  Patient stated at this time she only needs refills on the following and would like these sent to [facility]  LR 08/31/17 #270/3 [facility]. LR 08/31/17 #90/3 Last office visit 06/03/17  Patient stated that she will call her insurance company about the [place] and will call back with the information.

## 2018-06-10 ENCOUNTER — Encounter: Payer: Self-pay | Admitting: *Deleted

## 2018-06-11 ENCOUNTER — Ambulatory Visit: Payer: Medicare Other | Admitting: Gastroenterology

## 2018-06-11 ENCOUNTER — Encounter: Payer: Self-pay | Admitting: Family Medicine

## 2018-06-12 ENCOUNTER — Telehealth: Payer: Self-pay | Admitting: *Deleted

## 2018-06-12 NOTE — Telephone Encounter (Signed)
PA for pt's Januvia done at [email or web address], I will await a response

## 2018-06-25 DIAGNOSIS — L820 Inflamed seborrheic keratosis: Secondary | ICD-10-CM | POA: Diagnosis not present

## 2018-06-25 DIAGNOSIS — L821 Other seborrheic keratosis: Secondary | ICD-10-CM | POA: Diagnosis not present

## 2018-06-25 DIAGNOSIS — D1800 Hemangioma unspecified site: Secondary | ICD-10-CM | POA: Diagnosis not present

## 2018-06-25 DIAGNOSIS — L219 Seborrheic dermatitis, unspecified: Secondary | ICD-10-CM | POA: Diagnosis not present

## 2018-06-25 DIAGNOSIS — L812 Freckles: Secondary | ICD-10-CM | POA: Diagnosis not present

## 2018-06-25 DIAGNOSIS — C44519 Basal cell carcinoma of skin of other part of trunk: Secondary | ICD-10-CM | POA: Diagnosis not present

## 2018-06-25 DIAGNOSIS — Z1283 Encounter for screening for malignant neoplasm of skin: Secondary | ICD-10-CM | POA: Diagnosis not present

## 2018-06-25 DIAGNOSIS — D225 Melanocytic nevi of trunk: Secondary | ICD-10-CM | POA: Diagnosis not present

## 2018-06-25 DIAGNOSIS — C44612 Basal cell carcinoma of skin of right upper limb, including shoulder: Secondary | ICD-10-CM | POA: Diagnosis not present

## 2018-06-25 DIAGNOSIS — D229 Melanocytic nevi, unspecified: Secondary | ICD-10-CM | POA: Diagnosis not present

## 2018-06-25 DIAGNOSIS — L853 Xerosis cutis: Secondary | ICD-10-CM | POA: Diagnosis not present

## 2018-07-08 DIAGNOSIS — M71311 Other bursal cyst, right shoulder: Secondary | ICD-10-CM | POA: Diagnosis not present

## 2018-07-15 DIAGNOSIS — M71311 Other bursal cyst, right shoulder: Secondary | ICD-10-CM | POA: Diagnosis not present

## 2018-08-01 DIAGNOSIS — H3582 Retinal ischemia: Secondary | ICD-10-CM | POA: Diagnosis not present

## 2018-08-01 DIAGNOSIS — H35032 Hypertensive retinopathy, left eye: Secondary | ICD-10-CM | POA: Diagnosis not present

## 2018-08-01 DIAGNOSIS — E113393 Type 2 diabetes mellitus with moderate nonproliferative diabetic retinopathy without macular edema, bilateral: Secondary | ICD-10-CM | POA: Diagnosis not present

## 2018-08-01 DIAGNOSIS — H35352 Cystoid macular degeneration, left eye: Secondary | ICD-10-CM | POA: Diagnosis not present

## 2018-08-01 DIAGNOSIS — H35371 Puckering of macula, right eye: Secondary | ICD-10-CM | POA: Diagnosis not present

## 2018-08-01 DIAGNOSIS — H34832 Tributary (branch) retinal vein occlusion, left eye, with macular edema: Secondary | ICD-10-CM | POA: Diagnosis not present

## 2018-08-01 LAB — HM DIABETES EYE EXAM

## 2018-08-06 ENCOUNTER — Other Ambulatory Visit: Payer: Self-pay | Admitting: *Deleted

## 2018-08-06 ENCOUNTER — Encounter: Payer: Self-pay | Admitting: Family Medicine

## 2018-08-06 MED ORDER — LEVOTHYROXINE SODIUM 50 MCG PO TABS: ORAL_TABLET | ORAL | 1 refills | Status: DC

## 2018-08-06 MED ORDER — SERTRALINE HCL 100 MG PO TABS: 100.0000 mg | ORAL_TABLET | Freq: Every day | ORAL | 1 refills | Status: DC

## 2018-08-06 MED ORDER — OMEPRAZOLE 20 MG PO CPDR: DELAYED_RELEASE_CAPSULE | ORAL | 1 refills | Status: DC

## 2018-08-06 MED ORDER — METFORMIN HCL ER 500 MG PO TB24: 1500.0000 mg | ORAL_TABLET | Freq: Every day | ORAL | 1 refills | Status: DC

## 2018-08-06 MED ORDER — SITAGLIPTIN PHOSPHATE 100 MG PO TABS: 100.0000 mg | ORAL_TABLET | Freq: Every day | ORAL | 1 refills | Status: DC

## 2018-08-06 MED ORDER — LOSARTAN POTASSIUM 100 MG PO TABS: 100.0000 mg | ORAL_TABLET | Freq: Every day | ORAL | 1 refills | Status: DC

## 2018-08-06 MED ORDER — EZETIMIBE 10 MG PO TABS: 10.0000 mg | ORAL_TABLET | Freq: Every day | ORAL | 1 refills | Status: DC

## 2018-08-06 MED ORDER — HYDROCHLOROTHIAZIDE 25 MG PO TABS: 25.0000 mg | ORAL_TABLET | Freq: Every day | ORAL | 1 refills | Status: DC

## 2018-08-06 MED ORDER — ROPINIROLE HCL 1 MG PO TABS: 1.0000 mg | ORAL_TABLET | Freq: Every day | ORAL | 1 refills | Status: DC

## 2018-08-06 NOTE — Telephone Encounter (Signed)
Received fax requesting Rxs be sent to pt's mail order pharmacy, done

## 2018-08-14 ENCOUNTER — Other Ambulatory Visit: Payer: Self-pay | Admitting: *Deleted

## 2018-08-14 MED ORDER — AMLODIPINE BESYLATE 5 MG PO TABS: 5.0000 mg | ORAL_TABLET | Freq: Every day | ORAL | 1 refills | Status: DC

## 2018-08-30 ENCOUNTER — Other Ambulatory Visit: Payer: Medicare Other

## 2018-09-04 ENCOUNTER — Encounter: Payer: Self-pay | Admitting: Family Medicine

## 2018-09-06 ENCOUNTER — Encounter: Payer: Medicare Other | Admitting: Family Medicine

## 2018-09-06 ENCOUNTER — Ambulatory Visit (INDEPENDENT_AMBULATORY_CARE_PROVIDER_SITE_OTHER): Payer: Medicare Other

## 2018-09-06 DIAGNOSIS — Z0000 Encounter for general adult medical examination without abnormal findings: Secondary | ICD-10-CM

## 2018-09-09 NOTE — Patient Instructions (Signed)
Valerie Ball , Thank you for taking time to come for your Medicare Wellness Visit. I appreciate your ongoing commitment to your health goals. Please review the following plan we discussed and let me know if I can assist you in the future.   These are the goals we discussed: Goals    . Patient Stated     Starting 09/09/18, I will continue to take medication as prescribed.        This is a list of the screening recommended for you and due dates:  Health Maintenance  Topic Date Due  . Complete foot exam   03/19/2020*  . Mammogram  07/18/2020*  .  Hepatitis C: One time screening is recommended by Center for Disease Control  (CDC) for  adults born from 18 through 1963.   04/22/2023*  . Flu Shot  10/19/2018  . Hemoglobin A1C  11/20/2018  . Eye exam for diabetics  08/01/2019  . Tetanus Vaccine  07/18/2021  . Colon Cancer Screening  03/24/2024  . DEXA scan (bone density measurement)  Completed  . Pneumonia vaccines  Completed  *Topic was postponed. The date shown is not the original due date.   Preventive Care for Adults  A healthy lifestyle and preventive care can promote health and wellness. Preventive health guidelines for adults include the following key practices.  . A routine yearly physical is a good way to check with your health care provider about your health and preventive screening. It is a chance to share any concerns and updates on your health and to receive a thorough exam.  . Visit your dentist for a routine exam and preventive care every 6 months. Brush your teeth twice a day and floss once a day. Good oral hygiene prevents tooth decay and gum disease.  . The frequency of eye exams is based on your age, health, family medical history, use  of contact lenses, and other factors. Follow your health care provider's recommendations for frequency of eye exams.  . Eat a healthy diet. Foods like vegetables, fruits, whole grains, low-fat dairy products, and lean protein foods  contain the nutrients you need without too many calories. Decrease your intake of foods high in solid fats, added sugars, and salt. Eat the right amount of calories for you. Get information about a proper diet from your health care provider, if necessary.  . Regular physical exercise is one of the most important things you can do for your health. Most adults should get at least 150 minutes of moderate-intensity exercise (any activity that increases your heart rate and causes you to sweat) each week. In addition, most adults need muscle-strengthening exercises on 2 or more days a week.  Silver Sneakers may be a benefit available to you. To determine eligibility, you may visit the website: www.silversneakers.com or contact program at (909)767-8250 Mon-Fri between 8AM-8PM.   . Maintain a healthy weight. The body mass index (BMI) is a screening tool to identify possible weight problems. It provides an estimate of body fat based on height and weight. Your health care provider can find your BMI and can help you achieve or maintain a healthy weight.   For adults 20 years and older: ? A BMI below 18.5 is considered underweight. ? A BMI of 18.5 to 24.9 is normal. ? A BMI of 25 to 29.9 is considered overweight. ? A BMI of 30 and above is considered obese.   . Maintain normal blood lipids and cholesterol levels by exercising and minimizing your intake  of saturated fat. Eat a balanced diet with plenty of fruit and vegetables. Blood tests for lipids and cholesterol should begin at age 62 and be repeated every 5 years. If your lipid or cholesterol levels are high, you are over 50, or you are at high risk for heart disease, you may need your cholesterol levels checked more frequently. Ongoing high lipid and cholesterol levels should be treated with medicines if diet and exercise are not working.  . If you smoke, find out from your health care provider how to quit. If you do not use tobacco, please do not  start.  . If you choose to drink alcohol, please do not consume more than 2 drinks per day. One drink is considered to be 12 ounces (355 mL) of beer, 5 ounces (148 mL) of wine, or 1.5 ounces (44 mL) of liquor.  . If you are 52-35 years old, ask your health care provider if you should take aspirin to prevent strokes.  . Use sunscreen. Apply sunscreen liberally and repeatedly throughout the day. You should seek shade when your shadow is shorter than you. Protect yourself by wearing long sleeves, pants, a wide-brimmed hat, and sunglasses year round, whenever you are outdoors.  . Once a month, do a whole body skin exam, using a mirror to look at the skin on your back. Tell your health care provider of new moles, moles that have irregular borders, moles that are larger than a pencil eraser, or moles that have changed in shape or color.

## 2018-09-09 NOTE — Progress Notes (Signed)
PCP notes:   Health maintenance:  Foot exam - to be determined  Abnormal screenings:   None  Patient concerns:   None  Nurse concerns:  None  Next PCP appt:   10/02/18 @ 1430  I reviewed health advisor's note, was available for consultation, and agree with documentation and plan. Loura Pardon MD

## 2018-09-09 NOTE — Progress Notes (Signed)
Subjective:   Valerie Ball is a 73 y.o. female who presents for Medicare Annual (Subsequent) preventive examination.  Review of Systems:  N/A Cardiac Risk Factors include: advanced age (>2men, >77 women)     Objective:     Vitals: LMP 03/20/1981   There is no height or weight on file to calculate BMI.  Advanced Directives 09/06/2018 08/31/2017 08/22/2016 06/30/2015 05/28/2014 02/05/2014 01/04/2012  Does Patient Have a Medical Advance Directive? No No No No No No Patient does not have advance directive;Patient would like information  Would patient like information on creating a medical advance directive? No - Patient declined No - Patient declined - No - patient declined information No - patient declined information - Advance directive packet given;Other (Comment)  Pre-existing out of facility DNR order (yellow form or pink MOST form) - - - - - - No    Tobacco Social History   Tobacco Use  Smoking Status Never Smoker  Smokeless Tobacco Never Used     Counseling given: No   Clinical Intake:  Pre-visit preparation completed: Yes  Pain : No/denies pain Pain Score: 0-No pain     Nutritional Status: BMI 25 -29 Overweight Nutritional Risks: None Diabetes: Yes CBG done?: No Did pt. bring in CBG monitor from home?: No  How often do you need to have someone help you when you read instructions, pamphlets, or other written materials from your doctor or pharmacy?: 1 - Never What is the last grade level you completed in school?: 12th grade + 2 yrs college  Interpreter Needed?: No  Comments: pt lives with spouse Information entered by :: Risk analyst, RN  Past Medical History:  Diagnosis Date   Allergy    allegic rhinitis   Anemia    Angiodysplasia of colon    Anxiety    Arthritis    Blood transfusion    Borderline diabetic    per patient medical history form   Breast cancer ([place]) 02/2009   breast CA L invasive ductal CA(hormone receptor positive.)   Cataract     Colon polyp    hyperplastic   Colon stricture ([place]) 01/14/2003   Diabetes mellitus    pt's medical doctor took her off diabetic meds-blood sugars have not been a problem--and medications were causing pt's sugars to be too low   Diverticulosis    Esophageal stricture 02/04/2008   GERD (gastroesophageal reflux disease)    GI bleed    Hepatic steatosis    Hiatal hernia    Hyperlipidemia    Hypertension    Hypothyroidism    IBS (irritable bowel syndrome)    Melena 01/2008   anemia transfusion   Menopause    per patient medical history form   Osteoporosis    Postmenopausal hormone therapy    per patient medical history form.   RLS (restless legs syndrome)    Salmonella 05/2000   renal effects secondary to dehydration   Seizure disorder ([place])    Seizures ([place])    one seizure several yrs ago --etiology unknown-no problem since   Past Surgical History:  Procedure Laterality Date   ABDOMINAL HYSTERECTOMY  1983   BREAST SURGERY  02/2009   breast biopsy invasive ductal CA-bilateral mastectomies   EPIGASTRIC HERNIA REPAIR  01/02/2012   Procedure: HERNIA REPAIR EPIGASTRIC ADULT;  Surgeon: Pedro Earls, MD;  Location: [facility];  Service: General;  Laterality: N/A;  Laparoscopic Repair Paraesophageal Hernia, [place]   EYE SURGERY Left 07/2017   Dr. Zadie Rhine   LAPAROSCOPIC  NISSEN FUNDOPLICATION  [id number]   Procedure: LAPAROSCOPIC NISSEN FUNDOPLICATION;  Surgeon: Pedro Earls, MD;  Location: [facility];  Service: General;  Laterality: N/A;   MASTECTOMY  04/2009   Bilateral for ductal carcinoma on L   OVARY SURGERY  1986   ovarian tumor removed   Family History  Problem Relation Age of Onset   Heart attack Father    Diabetes Father    Esophageal cancer Father    Breast cancer Sister        x 2 sisters   Diabetes Sister    Hypertension Brother    Diabetes Brother    Hypertension Mother    Stroke Mother    Breast cancer Mother     Hypertension Sister    Social History   Socioeconomic History   Marital status: Married    Spouse name: Not on file   Number of children: 3   Years of education: Not on file   Highest education level: Not on file  Occupational History   Occupation: retired    Fish farm manager: UNEMPLOYED  Social Designer, fashion/clothing strain: Not on file   Food insecurity    Worry: Not on file    Inability: Not on file   Transportation needs    Medical: Not on file    Non-medical: Not on file  Tobacco Use   Smoking status: Never Smoker   Smokeless tobacco: Never Used  Substance and Sexual Activity   Alcohol use: No    Alcohol/week: 0.0 standard drinks   Drug use: No   Sexual activity: Not Currently  Lifestyle   Physical activity    Days per week: Not on file    Minutes per session: Not on file   Stress: Not on file  Relationships   Social connections    Talks on phone: Not on file    Gets together: Not on file    Attends religious service: Not on file    Active member of club or organization: Not on file    Attends meetings of clubs or organizations: Not on file    Relationship status: Not on file  Other Topics Concern   Not on file  Social History Narrative   1 cup of coffee every morning    Outpatient Encounter Medications as of 09/06/2018  Medication Sig   amLODipine (NORVASC) 5 MG tablet Take 1 tablet (5 mg total) by mouth daily.   aspirin EC 81 MG tablet Take 81 mg by mouth daily.   ezetimibe (ZETIA) 10 MG tablet Take 1 tablet (10 mg total) by mouth daily.   fluticasone (FLONASE) 50 MCG/ACT nasal spray Place 2 sprays into both nostrils daily as needed. Allergies   glucose blood (FREESTYLE LITE) test strip Use to check blood sugar once daily and as needed (diagnosis E11.65)   hydrochlorothiazide (HYDRODIURIL) 25 MG tablet Take 1 tablet (25 mg total) by mouth daily.   levothyroxine (SYNTHROID) 50 MCG tablet TAKE 1 TABLET BY MOUTH ONCE DAILY BEFORE  BREAKFAST   losartan (COZAAR) 100 MG tablet Take 1 tablet (100 mg total) by mouth daily.   metFORMIN (GLUCOPHAGE-XR) 500 MG 24 hr tablet Take 3 tablets (1,500 mg total) by mouth daily with breakfast.   Multiple Vitamin (MULTIVITAMIN WITH MINERALS) TABS tablet Take 1 tablet by mouth daily.   omeprazole (PRILOSEC) 20 MG capsule TAKE ONE CAPSULE BY MOUTH TWICE DAILY BEFORE MEAL(S)   rOPINIRole (REQUIP) 1 MG tablet Take 1 tablet (1 mg total) by mouth at  bedtime.   sertraline (ZOLOFT) 100 MG tablet Take 1 tablet (100 mg total) by mouth at bedtime.   sitaGLIPtin (JANUVIA) 100 MG tablet Take 1 tablet (100 mg total) by mouth daily.   [DISCONTINUED] cetirizine (ZYRTEC) 10 MG tablet Take 10 mg by mouth daily as needed. For allergy symptoms   No facility-administered encounter medications on file as of 09/06/2018.     Activities of Daily Living In your present state of health, do you have any difficulty performing the following activities: 09/06/2018  Hearing? N  Vision? N  Difficulty concentrating or making decisions? N  Walking or climbing stairs? N  Dressing or bathing? N  Doing errands, shopping? N  Preparing Food and eating ? N  Using the Toilet? N  In the past six months, have you accidently leaked urine? N  Do you have problems with loss of bowel control? N  Managing your Medications? N  Managing your Finances? N  Housekeeping or managing your Housekeeping? N  Some recent data might be hidden    Patient Care Team: Tower, Wynelle Fanny, MD as PCP - General    Assessment:   This is a routine wellness examination for [place].   Hearing Screening   125Hz  250Hz  500Hz  1000Hz  2000Hz  3000Hz  4000Hz  6000Hz  8000Hz   Right ear:           Left ear:           Vision Screening Comments: Vision exam in 2019 ; regular injections due to blood clot in left eye   Exercise Activities and Dietary recommendations Current Exercise Habits: Home exercise routine, Type of exercise: walking(foot pedal  30 minutes), Time (Minutes): 60, Frequency (Times/Week): 7, Weekly Exercise (Minutes/Week): 420, Intensity: Mild, Exercise limited by: None identified  Goals     Patient Stated     Starting 09/09/18, I will continue to take medication as prescribed.        Fall Risk Fall Risk  09/06/2018 08/31/2017 08/22/2016 02/24/2016 01/26/2015  Falls in the past year? 0 Yes No No Yes  Comment - pt lost balance and fell off bed - Emmi Telephone Survey: data to providers prior to load -  Number falls in past yr: - 1 - - 1  Injury with Fall? - Yes - - No  Comment - bruise to right knee - - -     Depression Screen PHQ 2/9 Scores 09/06/2018 08/31/2017 08/22/2016 01/26/2015  PHQ - 2 Score 0 4 0 0  PHQ- 9 Score 0 15 - -     Cognitive Function MMSE - Mini Mental State Exam 09/06/2018 08/31/2017 08/22/2016  Orientation to time 5 5 5   Orientation to Place 5 5 5   Registration 3 3 3   Attention/ Calculation 0 0 0  Recall 3 3 3   Language- name 2 objects 0 0 0  Language- repeat 1 1 1   Language- follow 3 step command 0 3 3  Language- read & follow direction 0 0 0  Write a sentence 0 0 0  Copy design 0 0 0  Total score 17 20 20      PLEASE NOTE: A Mini-Cog screen was completed. Maximum score is 17. A value of 0 denotes this part of [place] MMSE was not completed or the patient failed this part of the Mini-Cog screening.   Mini-Cog Screening Orientation to Time - Max 5 pts Orientation to Place - Max 5 pts Registration - Max 3 pts Recall - Max 3 pts Language Repeat - Max 1 pts  Immunization History  Administered Date(s) Administered   Influenza Split 01/17/2011   Influenza Whole 04/04/2005, 01/22/2009   Influenza, High Dose Seasonal PF 01/10/2018   Influenza,inj,Quad PF,6+ Mos 12/06/2012, 11/25/2013, 01/26/2015, 11/12/2015, 03/29/2017   Pneumococcal Conjugate-13 01/26/2015   Pneumococcal Polysaccharide-23 02/10/2008, 01/23/2014   Tdap 07/19/2011   Zoster Recombinat (Shingrix) 01/10/2018,  04/21/2018    Screening Tests Health Maintenance  Topic Date Due   FOOT EXAM  03/19/2020 (Originally 09/01/2018)   MAMMOGRAM  07/18/2020 (Originally 03/24/2011)   Hepatitis C Screening  04/22/2023 (Originally 13-Sep-1945)   INFLUENZA VACCINE  10/19/2018   HEMOGLOBIN A1C  11/20/2018   OPHTHALMOLOGY EXAM  08/01/2019   TETANUS/TDAP  07/18/2021   COLONOSCOPY  03/24/2024   DEXA SCAN  Completed   PNA vac Low Risk Adult  Completed      Plan:     I have personally reviewed, addressed, and noted the following in the patients chart:  A. Medical and social history B. Use of alcohol, tobacco or illicit drugs  C. Current medications and supplements D. Functional ability and status E.  Nutritional status F.  Physical activity G. Advance directives H. List of other physicians I.  Hospitalizations, surgeries, and ER visits in previous 12 months J.  Vitals (unless it is a telemedicine encounter) K. Screenings to include cognitive, depression, hearing, vision (NOTE: hearing and vision screenings not completed in telemedicine encounter) L. Referrals and appointments   In addition, I have reviewed and discussed with patient certain preventive protocols, quality metrics, and best practice recommendations. A written personalized care plan for preventive services and recommendations were provided to patient.  With patient's permission, we connected on 09/06/18 at 11:00 AM EDT. Interactive audio and video telecommunications were attempted with patient. This attempt was unsuccessful due to patient having technical difficulties OR patient did not have access to video capability.  Encounter was completed with audio only.  Two patient identifiers were used to ensure the encounter occurred with the correct person. Patient was in home and writer was in office.     Signed,   Lindell Noe, MHA, BS, RN Health Coach

## 2018-09-24 ENCOUNTER — Telehealth: Payer: Self-pay | Admitting: Family Medicine

## 2018-09-24 DIAGNOSIS — E785 Hyperlipidemia, unspecified: Secondary | ICD-10-CM

## 2018-09-24 DIAGNOSIS — I10 Essential (primary) hypertension: Secondary | ICD-10-CM

## 2018-09-24 DIAGNOSIS — E1140 Type 2 diabetes mellitus with diabetic neuropathy, unspecified: Secondary | ICD-10-CM

## 2018-09-24 DIAGNOSIS — E1169 Type 2 diabetes mellitus with other specified complication: Secondary | ICD-10-CM

## 2018-09-24 DIAGNOSIS — D649 Anemia, unspecified: Secondary | ICD-10-CM

## 2018-09-24 DIAGNOSIS — E538 Deficiency of other specified B group vitamins: Secondary | ICD-10-CM

## 2018-09-24 DIAGNOSIS — E1165 Type 2 diabetes mellitus with hyperglycemia: Secondary | ICD-10-CM

## 2018-09-24 DIAGNOSIS — D696 Thrombocytopenia, unspecified: Secondary | ICD-10-CM

## 2018-09-24 DIAGNOSIS — K760 Fatty (change of) liver, not elsewhere classified: Secondary | ICD-10-CM

## 2018-09-24 DIAGNOSIS — E039 Hypothyroidism, unspecified: Secondary | ICD-10-CM

## 2018-09-24 NOTE — Telephone Encounter (Signed)
-----   Message from Ellamae Sia sent at 09/23/2018  3:44 PM EDT ----- Regarding: Lab orders for Friday, 7.10.20 Patient is scheduled for CPX labs, please order future labs, Thanks , Karna Christmas

## 2018-09-25 ENCOUNTER — Telehealth: Payer: Self-pay

## 2018-09-25 NOTE — Telephone Encounter (Signed)
Left detailed VM w COVID screen and back door lab info   

## 2018-09-27 ENCOUNTER — Other Ambulatory Visit (INDEPENDENT_AMBULATORY_CARE_PROVIDER_SITE_OTHER): Payer: Medicare Other

## 2018-09-27 ENCOUNTER — Other Ambulatory Visit: Payer: Self-pay

## 2018-09-27 DIAGNOSIS — E039 Hypothyroidism, unspecified: Secondary | ICD-10-CM | POA: Diagnosis not present

## 2018-09-27 DIAGNOSIS — E538 Deficiency of other specified B group vitamins: Secondary | ICD-10-CM | POA: Diagnosis not present

## 2018-09-27 DIAGNOSIS — I10 Essential (primary) hypertension: Secondary | ICD-10-CM

## 2018-09-27 DIAGNOSIS — K760 Fatty (change of) liver, not elsewhere classified: Secondary | ICD-10-CM | POA: Diagnosis not present

## 2018-09-27 DIAGNOSIS — E1165 Type 2 diabetes mellitus with hyperglycemia: Secondary | ICD-10-CM | POA: Diagnosis not present

## 2018-09-27 DIAGNOSIS — D696 Thrombocytopenia, unspecified: Secondary | ICD-10-CM

## 2018-09-27 DIAGNOSIS — E785 Hyperlipidemia, unspecified: Secondary | ICD-10-CM

## 2018-09-27 DIAGNOSIS — E1169 Type 2 diabetes mellitus with other specified complication: Secondary | ICD-10-CM | POA: Diagnosis not present

## 2018-09-27 DIAGNOSIS — E1140 Type 2 diabetes mellitus with diabetic neuropathy, unspecified: Secondary | ICD-10-CM | POA: Diagnosis not present

## 2018-09-27 LAB — COMPREHENSIVE METABOLIC PANEL
ALT: 28 U/L (ref 0–35)
AST: 37 U/L (ref 0–37)
Albumin: 4.5 g/dL (ref 3.5–5.2)
Alkaline Phosphatase: 86 U/L (ref 39–117)
BUN: 22 mg/dL (ref 6–23)
CO2: 29 mEq/L (ref 19–32)
Calcium: 9.4 mg/dL (ref 8.4–10.5)
Chloride: 100 mEq/L (ref 96–112)
Creatinine, Ser: 0.98 mg/dL (ref 0.40–1.20)
GFR: 55.93 mL/min — ABNORMAL LOW (ref >60.00)
Glucose, Bld: 202 mg/dL — ABNORMAL HIGH (ref 70–99)
Potassium: 4.3 mEq/L (ref 3.5–5.1)
Sodium: 141 mEq/L (ref 135–145)
Total Bilirubin: 0.5 mg/dL (ref 0.2–1.2)
Total Protein: 7.8 g/dL (ref 6.0–8.3)

## 2018-09-27 LAB — CBC WITH DIFFERENTIAL/PLATELET
Basophils Absolute: 0 10*3/uL (ref 0.0–0.1)
Basophils Relative: 0.3 % (ref 0.0–3.0)
Eosinophils Absolute: 0 10*3/uL (ref 0.0–0.7)
Eosinophils Relative: 0.8 % (ref 0.0–5.0)
HCT: 37.8 % (ref 36.0–46.0)
Hemoglobin: 12.5 g/dL (ref 12.0–15.0)
Lymphocytes Relative: 28.1 % (ref 12.0–46.0)
Lymphs Abs: 1.4 10*3/uL (ref 0.7–4.0)
MCHC: 33 g/dL (ref 30.0–36.0)
MCV: 83.3 fl (ref 78.0–100.0)
Monocytes Absolute: 0.4 10*3/uL (ref 0.1–1.0)
Monocytes Relative: 7.5 % (ref 3.0–12.0)
Neutro Abs: 3.1 10*3/uL (ref 1.4–7.7)
Neutrophils Relative %: 63.3 % (ref 43.0–77.0)
Platelets: 111 10*3/uL — ABNORMAL LOW (ref 150.0–400.0)
RBC: 4.54 Mil/uL (ref 3.87–5.11)
RDW: 16.6 % — ABNORMAL HIGH (ref 11.5–15.5)
WBC: 4.9 10*3/uL (ref 4.0–10.5)

## 2018-09-27 LAB — LIPID PANEL
Cholesterol: 171 mg/dL (ref 0–200)
HDL: 34.5 mg/dL — ABNORMAL LOW (ref >39.00)
NonHDL: 136.77
Total CHOL/HDL Ratio: 5
Triglycerides: 358 mg/dL — ABNORMAL HIGH (ref 0.0–149.0)
VLDL: 71.6 mg/dL — ABNORMAL HIGH (ref 0.0–40.0)

## 2018-09-27 LAB — TSH: TSH: 4.36 u[IU]/mL (ref 0.35–4.50)

## 2018-09-27 LAB — LDL CHOLESTEROL, DIRECT: Direct LDL: 105 mg/dL

## 2018-09-27 LAB — HEMOGLOBIN A1C: Hgb A1c MFr Bld: 7.8 % — ABNORMAL HIGH (ref 4.6–6.5)

## 2018-09-27 LAB — VITAMIN B12: Vitamin B-12: 928 pg/mL — ABNORMAL HIGH (ref 211–911)

## 2018-10-02 ENCOUNTER — Ambulatory Visit (INDEPENDENT_AMBULATORY_CARE_PROVIDER_SITE_OTHER): Payer: Medicare Other | Admitting: Family Medicine

## 2018-10-02 ENCOUNTER — Other Ambulatory Visit: Payer: Self-pay

## 2018-10-02 ENCOUNTER — Encounter: Payer: Self-pay | Admitting: Family Medicine

## 2018-10-02 VITALS — BP 128/80 | HR 103 | Temp 97.5°F | Ht 63.0 in | Wt 152.4 lb

## 2018-10-02 DIAGNOSIS — E1142 Type 2 diabetes mellitus with diabetic polyneuropathy: Secondary | ICD-10-CM | POA: Diagnosis not present

## 2018-10-02 DIAGNOSIS — K3184 Gastroparesis: Secondary | ICD-10-CM | POA: Diagnosis not present

## 2018-10-02 DIAGNOSIS — C50911 Malignant neoplasm of unspecified site of right female breast: Secondary | ICD-10-CM | POA: Diagnosis not present

## 2018-10-02 DIAGNOSIS — I10 Essential (primary) hypertension: Secondary | ICD-10-CM

## 2018-10-02 DIAGNOSIS — D696 Thrombocytopenia, unspecified: Secondary | ICD-10-CM

## 2018-10-02 DIAGNOSIS — E039 Hypothyroidism, unspecified: Secondary | ICD-10-CM | POA: Diagnosis not present

## 2018-10-02 DIAGNOSIS — E1143 Type 2 diabetes mellitus with diabetic autonomic (poly)neuropathy: Secondary | ICD-10-CM | POA: Diagnosis not present

## 2018-10-02 DIAGNOSIS — E1140 Type 2 diabetes mellitus with diabetic neuropathy, unspecified: Secondary | ICD-10-CM | POA: Diagnosis not present

## 2018-10-02 DIAGNOSIS — E1165 Type 2 diabetes mellitus with hyperglycemia: Secondary | ICD-10-CM | POA: Diagnosis not present

## 2018-10-02 DIAGNOSIS — E785 Hyperlipidemia, unspecified: Secondary | ICD-10-CM

## 2018-10-02 DIAGNOSIS — E538 Deficiency of other specified B group vitamins: Secondary | ICD-10-CM | POA: Diagnosis not present

## 2018-10-02 DIAGNOSIS — E1169 Type 2 diabetes mellitus with other specified complication: Secondary | ICD-10-CM | POA: Diagnosis not present

## 2018-10-02 DIAGNOSIS — Z170 Estrogen receptor positive status [ER+]: Secondary | ICD-10-CM

## 2018-10-02 NOTE — Assessment & Plan Note (Signed)
Reviewed health habits including diet and exercise and skin cancer prevention Reviewed appropriate screening tests for age  Also reviewed health mt list, fam hx and immunization status , as well as social and family history   See HPI Enc flu shot in the fall  Disc imp of healthy diet and exercise  Ca and D recommended for bone health

## 2018-10-02 NOTE — Assessment & Plan Note (Signed)
No clinical changes Working towards better DM control

## 2018-10-02 NOTE — Progress Notes (Signed)
Subjective:    Patient ID: Valerie Ball, female    DOB: [birth year], 73 y.o.   MRN: [id number]  HPI Here for annual f/u of chronic health problems   Wt Readings from Last 3 Encounters:  10/02/18 152 lb 6 oz (69.1 kg)  06/04/18 154 lb 5 oz (70 kg)  05/20/18 154 lb 5 oz (70 kg)  stable  26.99 kg/m   Feeling fair  Pandemic is hard to deal with  Husband has been sick -she has to take care of him   amw was 6/19 No concerns or gaps   Breast health -double mastectomy for breast cancer  She gets some pain in axillae  LN were taken on the L  No skin change or swelling   Flu vaccines-gets yearly   Colonoscopy 1/16 Still struggling with hemorrhoids bleeding a lot  Saw GI Did not suggest hemorrhoid surgery    dexa 12/15 -bmd in normal range She is getting shorter  Falls -one fall 3 weeks ago- getting out of the tub/was holding handle  Other hand slid off the side of the tub  Fractures -none   Had the shingrix series   bp is stable today  No cp or palpitations or headaches or edema  No side effects to medicines  BP Readings from Last 3 Encounters:  10/02/18 (!) 142/82  06/04/18 131/80  05/20/18 122/68     Hypothyroidism  Pt has no clinical changes No change in energy level/ hair or skin/ edema and no tremor Lab Results  Component Value Date   TSH 4.36 09/27/2018     DM2 Lab Results  Component Value Date   HGBA1C 7.8 (H) 09/27/2018  down from 8.4  Last added januvia =working out well  Takes metformin xr -right now not taking /back order/also stomach issues  Eye exam 5/20 On arb Cannot take statins  Eating better/not eating out (husband does not like healthy foods)  Avoids sweets  Not a lot of exercise- does walk dogs  Does use a pedaler while sitting    Hyperlipidemia Lab Results  Component Value Date   CHOL 171 09/27/2018   CHOL 171 05/20/2018   CHOL 150 08/29/2017   Lab Results  Component Value Date   HDL 34.50 (L) 09/27/2018   HDL 29.30 (L)  05/20/2018   HDL 26.00 (L) 08/29/2017   No results found for: [facility] Lab Results  Component Value Date   TRIG 358.0 (H) 09/27/2018   TRIG (H) 05/20/2018    539.0 Triglyceride is over 400; calculations on Lipids are invalid.   TRIG 361.0 (H) 08/29/2017   Lab Results  Component Value Date   CHOLHDL 5 09/27/2018   CHOLHDL 6 05/20/2018   CHOLHDL 6 08/29/2017   Lab Results  Component Value Date   LDLDIRECT 105.0 09/27/2018   LDLDIRECT 95.0 05/20/2018   LDLDIRECT 82.0 08/29/2017   A bit improved zetia No statin-intol   Thrombocytopenia Lab Results  Component Value Date   WBC 4.9 09/27/2018   HGB 12.5 09/27/2018   HCT 37.8 09/27/2018   MCV 83.3 09/27/2018   PLT 111.0 (L) 09/27/2018   stable   B12 def Lab Results  Component Value Date   VITAMINB12 928 (H) 09/27/2018   Patient Active Problem List   Diagnosis Date Noted  . Rectal bleeding 05/20/2018  . Fatigue 05/20/2018  . Sebaceous cyst 05/20/2018  . Hip pain, bilateral 08/31/2017  . Vitamin B12 deficiency 09/21/2016  . Benign neoplasm of tongue 04/11/2016  .  Diabetic gastroparesis ([place]) 03/27/2016  . Retinal vein occlusion, branch 01/03/2016  . Alternating constipation and diarrhea 08/12/2015  . AVM (arteriovenous malformation) of colon 05/18/2015  . IBS (irritable bowel syndrome) 02/28/2015  . Cough 12/08/2014  . Diverticulitis of colon 10/14/2014  . Gout 07/22/2014  . Hypothyroidism 04/23/2014  . Nausea without vomiting 02/18/2014  . Encounter for Medicare annual wellness exam 01/23/2014  . Estrogen deficiency 01/23/2014  . Colon cancer screening 01/23/2014  . Tremor 01/20/2014  . Dizziness 01/07/2014  . Allergic rhinitis 01/07/2014  . Thrombocytopenia ([place]) 11/25/2013  . Diabetic neuropathy ([place]) 11/25/2013  . History of breast cancer 06/07/2012  . Lap Nissen October 2013 01/19/2012  . Large type III mixed hiatus hernia  12/06/2011  . Stress reaction 10/25/2011  . Routine general medical  examination at a health care facility 07/11/2011  . ADVERSE DRUG REACTION 05/05/2010  . TRANSAMINASES, SERUM, ELEVATED 11/11/2009  . Breast cancer, right breast ([place]) 08/02/2009  . DIVERTICULOSIS-COLON 03/03/2008  . ESOPHAGEAL STRICTURE 03/02/2008  . HIATAL HERNIA 03/02/2008  . PERITONEAL ADHESIONS 03/02/2008  . Anemia 02/07/2008  . Poorly controlled type 2 diabetes mellitus with neuropathy ([place]) 08/19/2007  . ANXIETY 04/15/2007  . DISORDERS, ORGANIC SLEEP NEC 08/23/2006  . SYNDROME, RESTLESS LEGS 08/23/2006  . SYNDROME, CARPAL TUNNEL 08/23/2006  . Hyperlipidemia associated with type 2 diabetes mellitus ([place]) 08/22/2006  . Essential hypertension 08/22/2006  . ALLERGIC RHINITIS 08/22/2006  . GERD 08/22/2006  . Fatty liver 08/22/2006  . History of seizure 08/22/2006   Past Medical History:  Diagnosis Date  . Allergy    allegic rhinitis  . Anemia   . Angiodysplasia of colon   . Anxiety   . Arthritis   . Blood transfusion   . Borderline diabetic    per patient medical history form  . Breast cancer ([place]) 02/2009   breast CA L invasive ductal CA(hormone receptor positive.)  . Cataract   . Colon polyp    hyperplastic  . Colon stricture ([place]) 01/14/2003  . Diabetes mellitus    pt's medical doctor took her off diabetic meds-blood sugars have not been a problem--and medications were causing pt's sugars to be too low  . Diverticulosis   . Esophageal stricture 02/04/2008  . GERD (gastroesophageal reflux disease)   . GI bleed   . Hepatic steatosis   . Hiatal hernia   . Hyperlipidemia   . Hypertension   . Hypothyroidism   . IBS (irritable bowel syndrome)   . Melena 01/2008   anemia transfusion  . Menopause    per patient medical history form  . Osteoporosis   . Postmenopausal hormone therapy    per patient medical history form.  Marland Kitchen RLS (restless legs syndrome)   . Salmonella 05/2000   renal effects secondary to dehydration  . Seizure disorder ([place])   . Seizures ([place])     one seizure several yrs ago --etiology unknown-no problem since   Past Surgical History:  Procedure Laterality Date  . ABDOMINAL HYSTERECTOMY  1983  . BREAST SURGERY  02/2009   breast biopsy invasive ductal CA-bilateral mastectomies  . EPIGASTRIC HERNIA REPAIR  01/02/2012   Procedure: HERNIA REPAIR EPIGASTRIC ADULT;  Surgeon: Pedro Earls, MD;  Location: [facility];  Service: General;  Laterality: N/A;  Laparoscopic Repair Paraesophageal Hernia, Nissen  . EYE SURGERY Left 07/2017   Dr. Zadie Rhine  . LAPAROSCOPIC NISSEN FUNDOPLICATION  [id number]   Procedure: LAPAROSCOPIC NISSEN FUNDOPLICATION;  Surgeon: Pedro Earls, MD;  Location: [facility];  Service: General;  Laterality: N/A;  . MASTECTOMY  04/2009   Bilateral for ductal carcinoma on L  . OVARY SURGERY  1986   ovarian tumor removed   Social History   Tobacco Use  . Smoking status: Never Smoker  . Smokeless tobacco: Never Used  Substance Use Topics  . Alcohol use: No    Alcohol/week: 0.0 standard drinks  . Drug use: No   Family History  Problem Relation Age of Onset  . Heart attack Father   . Diabetes Father   . Esophageal cancer Father   . Breast cancer Sister        x 2 sisters  . Diabetes Sister   . Hypertension Brother   . Diabetes Brother   . Hypertension Mother   . Stroke Mother   . Breast cancer Mother   . Hypertension Sister    Allergies  Allergen Reactions  . Arimidex [Anastrozole]     Joint pain  . Glipizide Other (See Comments)    Hypoglycemia   . Metformin And Related     diarrhea  . Penicillins Rash   Current Outpatient Medications on File Prior to Visit  Medication Sig Dispense Refill  . amLODipine (NORVASC) 5 MG tablet Take 1 tablet (5 mg total) by mouth daily. 90 tablet 1  . aspirin EC 81 MG tablet Take 81 mg by mouth daily.    Marland Kitchen ezetimibe (ZETIA) 10 MG tablet Take 1 tablet (10 mg total) by mouth daily. 90 tablet 1  . fluticasone (FLONASE) 50 MCG/ACT nasal spray Place 2 sprays into both  nostrils daily as needed. Allergies 48 g 3  . glucose blood (FREESTYLE LITE) test strip Use to check blood sugar once daily and as needed (diagnosis E11.65) 100 each 1  . hydrochlorothiazide (HYDRODIURIL) 25 MG tablet Take 1 tablet (25 mg total) by mouth daily. 90 tablet 1  . levothyroxine (SYNTHROID) 50 MCG tablet TAKE 1 TABLET BY MOUTH ONCE DAILY BEFORE BREAKFAST 90 tablet 1  . losartan (COZAAR) 100 MG tablet Take 1 tablet (100 mg total) by mouth daily. 90 tablet 1  . Multiple Vitamin (MULTIVITAMIN WITH MINERALS) TABS tablet Take 1 tablet by mouth daily.    Marland Kitchen omeprazole (PRILOSEC) 20 MG capsule TAKE ONE CAPSULE BY MOUTH TWICE DAILY BEFORE MEAL(S) 180 capsule 1  . rOPINIRole (REQUIP) 1 MG tablet Take 1 tablet (1 mg total) by mouth at bedtime. 90 tablet 1  . sertraline (ZOLOFT) 100 MG tablet Take 1 tablet (100 mg total) by mouth at bedtime. 90 tablet 1  . sitaGLIPtin (JANUVIA) 100 MG tablet Take 1 tablet (100 mg total) by mouth daily. 90 tablet 1  . metFORMIN (GLUCOPHAGE-XR) 500 MG 24 hr tablet Take 3 tablets (1,500 mg total) by mouth daily with breakfast. (Patient not taking: Reported on 10/02/2018) 270 tablet 1  . [DISCONTINUED] cetirizine (ZYRTEC) 10 MG tablet Take 10 mg by mouth daily as needed. For allergy symptoms     No current facility-administered medications on file prior to visit.      Review of Systems  Constitutional: Negative for activity change, appetite change, fatigue, fever and unexpected weight change.  HENT: Negative for congestion, ear pain, rhinorrhea, sinus pressure and sore throat.   Eyes: Negative for pain, redness and visual disturbance.  Respiratory: Negative for cough, shortness of breath and wheezing.   Cardiovascular: Negative for chest pain and palpitations.  Gastrointestinal: Positive for constipation and diarrhea. Negative for abdominal pain and blood in stool.       Chronic IBS bowel  issues and hemorrhoids  Endocrine: Negative for polydipsia and polyuria.   Genitourinary: Negative for dysuria, frequency and urgency.  Musculoskeletal: Negative for arthralgias, back pain and myalgias.  Skin: Negative for pallor and rash.  Allergic/Immunologic: Negative for environmental allergies.  Neurological: Negative for dizziness, syncope and headaches.  Hematological: Negative for adenopathy. Does not bruise/bleed easily.  Psychiatric/Behavioral: Negative for decreased concentration and dysphoric mood. The patient is not nervous/anxious.        Frustration dealing with her husband       Objective:   Physical Exam Constitutional:      General: She is not in acute distress.    Appearance: Normal appearance. She is well-developed and normal weight. She is not ill-appearing.  HENT:     Head: Normocephalic and atraumatic.     Right Ear: Tympanic membrane, ear canal and external ear normal.     Left Ear: Tympanic membrane, ear canal and external ear normal.     Nose: Nose normal.     Mouth/Throat:     Mouth: Mucous membranes are moist.     Pharynx: Oropharynx is clear. No posterior oropharyngeal erythema.  Eyes:     General: No scleral icterus.    Conjunctiva/sclera: Conjunctivae normal.     Pupils: Pupils are equal, round, and reactive to light.  Neck:     Musculoskeletal: Normal range of motion and neck supple. No neck rigidity or muscular tenderness.     Thyroid: No thyromegaly.     Vascular: No carotid bruit or JVD.  Cardiovascular:     Rate and Rhythm: Normal rate and regular rhythm.     Pulses: Normal pulses.     Heart sounds: Normal heart sounds. No gallop.   Pulmonary:     Effort: Pulmonary effort is normal. No respiratory distress.     Breath sounds: Normal breath sounds. No wheezing or rales.     Comments: Good air exch Chest:     Chest wall: No tenderness.  Abdominal:     General: Bowel sounds are normal. There is no distension or abdominal bruit.     Palpations: Abdomen is soft. There is no mass.     Tenderness: There is no  abdominal tenderness.     Hernia: No hernia is present.  Genitourinary:    Comments: Mastectomy site-bilat/no tenderness/masses or skin change Musculoskeletal: Normal range of motion.        General: No tenderness.     Right lower leg: No edema.     Left lower leg: No edema.  Lymphadenopathy:     Cervical: No cervical adenopathy.  Skin:    General: Skin is warm and dry.     Coloration: Skin is not pale.     Findings: No erythema, lesion or rash.     Comments: Solar lentigines diffusely   Neurological:     Mental Status: She is alert.     Cranial Nerves: No cranial nerve deficit.     Motor: No abnormal muscle tone.     Coordination: Coordination normal.     Gait: Gait normal.     Deep Tendon Reflexes: Reflexes are normal and symmetric. Reflexes normal.  Psychiatric:        Mood and Affect: Mood normal.           Assessment & Plan:   Problem List Items Addressed This Visit      Cardiovascular and Mediastinum   Essential hypertension    bp in fair control at this time  BP Readings  from Last 1 Encounters:  10/02/18 128/80   No changes needed Most recent labs reviewed  Disc lifstyle change with low sodium diet and exercise          Digestive   Diabetic gastroparesis (H[facility])    No clinical changes Working towards better DM control        Endocrine   Poorly controlled type 2 diabetes mellitus with neuropathy ([place]) - Primary    Lab Results  Component Value Date   HGBA1C 7.8 (H) 09/27/2018   This is improved, however recently stopped metformin xr due to GI issues (unsure if this caused) Also -aware this will be on backorder/unavail soon [place] has helped  Intol of glipizide (hypoglycemia)  inst to continue DM diet/add exercise  Report glucose readings in 2 wk  May need to consider basal insulin F/u 6 mo      Hyperlipidemia associated with type 2 diabetes mellitus ([place])   Hypothyroidism    Hypothyroidism  Pt has no clinical changes No change in energy  level/ hair or skin/ edema and no tremor Lab Results  Component Value Date   TSH 4.36 09/27/2018            Nervous and Auditory   Diabetic neuropathy ([place])    No clinical changes  Better if she keeps busy and active  Enc good DM control          Other   Breast cancer, right breast ([place])    S/p double mastectomy  occ pain in axillae since surgery  Overall doing well      Thrombocytopenia (H[facility])    Stable  Platelet ct 111  No clinical changes Continue to watch      Vitamin B12 deficiency    Lab Results  Component Value Date   VITAMINB12 928 (H) 09/27/2018   This is high Will cut current dose in 1/2

## 2018-10-02 NOTE — Assessment & Plan Note (Signed)
S/p double mastectomy  occ pain in axillae since surgery  Overall doing well

## 2018-10-02 NOTE — Assessment & Plan Note (Signed)
No clinical changes  Better if she keeps busy and active  Enc good DM control

## 2018-10-02 NOTE — Assessment & Plan Note (Signed)
Stable  Platelet ct 111  No clinical changes Continue to watch

## 2018-10-02 NOTE — Patient Instructions (Addendum)
Don't forget to get a flu shot in the fall   Try to get most of your carbohydrates from produce (with the exception of white potatoes)  Eat less bread/pasta/rice/snack foods/cereals/sweets and other items from the middle of the grocery store (processed carbs)   Aim for 30 minutes of exercise per day   Watch glucose levels  Alert me in 2 weeks re: how sugar is off metformin xr   Cut your B12 dose in 1/2 of what you are taking   Follow up in about 6 months

## 2018-10-02 NOTE — Assessment & Plan Note (Signed)
Lab Results  Component Value Date   [id number] 979 (H) 09/27/2018   This is high Will cut current dose in 1/2

## 2018-10-02 NOTE — Assessment & Plan Note (Signed)
bp in fair control at this time  BP Readings from Last 1 Encounters:  10/02/18 128/80   No changes needed Most recent labs reviewed  Disc lifstyle change with low sodium diet and exercise

## 2018-10-02 NOTE — Assessment & Plan Note (Signed)
Lab Results  Component Value Date   HGBA1C 7.8 (H) 09/27/2018   This is improved, however recently stopped metformin xr due to GI issues (unsure if this caused) Also -aware this will be on backorder/unavail soon [place] has helped  Intol of glipizide (hypoglycemia)  inst to continue DM diet/add exercise  Report glucose readings in 2 wk  May need to consider basal insulin F/u 6 mo

## 2018-10-02 NOTE — Assessment & Plan Note (Signed)
Hypothyroidism  Pt has no clinical changes No change in energy level/ hair or skin/ edema and no tremor Lab Results  Component Value Date   TSH 4.36 09/27/2018

## 2018-10-21 DIAGNOSIS — Z09 Encounter for follow-up examination after completed treatment for conditions other than malignant neoplasm: Secondary | ICD-10-CM | POA: Diagnosis not present

## 2018-10-21 DIAGNOSIS — H34832 Tributary (branch) retinal vein occlusion, left eye, with macular edema: Secondary | ICD-10-CM | POA: Diagnosis not present

## 2018-11-19 ENCOUNTER — Ambulatory Visit (INDEPENDENT_AMBULATORY_CARE_PROVIDER_SITE_OTHER): Payer: Medicare Other

## 2018-11-19 DIAGNOSIS — Z23 Encounter for immunization: Secondary | ICD-10-CM | POA: Diagnosis not present

## 2018-12-03 DIAGNOSIS — H3582 Retinal ischemia: Secondary | ICD-10-CM | POA: Diagnosis not present

## 2018-12-03 DIAGNOSIS — E113393 Type 2 diabetes mellitus with moderate nonproliferative diabetic retinopathy without macular edema, bilateral: Secondary | ICD-10-CM | POA: Diagnosis not present

## 2018-12-03 DIAGNOSIS — H35032 Hypertensive retinopathy, left eye: Secondary | ICD-10-CM | POA: Diagnosis not present

## 2018-12-03 DIAGNOSIS — H34832 Tributary (branch) retinal vein occlusion, left eye, with macular edema: Secondary | ICD-10-CM | POA: Diagnosis not present

## 2019-01-08 DIAGNOSIS — H35032 Hypertensive retinopathy, left eye: Secondary | ICD-10-CM | POA: Diagnosis not present

## 2019-01-08 DIAGNOSIS — H3582 Retinal ischemia: Secondary | ICD-10-CM | POA: Diagnosis not present

## 2019-01-08 DIAGNOSIS — H34832 Tributary (branch) retinal vein occlusion, left eye, with macular edema: Secondary | ICD-10-CM | POA: Diagnosis not present

## 2019-01-08 DIAGNOSIS — E113393 Type 2 diabetes mellitus with moderate nonproliferative diabetic retinopathy without macular edema, bilateral: Secondary | ICD-10-CM | POA: Diagnosis not present

## 2019-01-15 ENCOUNTER — Other Ambulatory Visit: Payer: Self-pay | Admitting: Family Medicine

## 2019-01-23 ENCOUNTER — Other Ambulatory Visit: Payer: Self-pay | Admitting: Family Medicine

## 2019-01-30 ENCOUNTER — Other Ambulatory Visit: Payer: Self-pay | Admitting: Family Medicine

## 2019-02-19 DIAGNOSIS — E113393 Type 2 diabetes mellitus with moderate nonproliferative diabetic retinopathy without macular edema, bilateral: Secondary | ICD-10-CM | POA: Diagnosis not present

## 2019-02-19 DIAGNOSIS — H35032 Hypertensive retinopathy, left eye: Secondary | ICD-10-CM | POA: Diagnosis not present

## 2019-02-19 DIAGNOSIS — H34832 Tributary (branch) retinal vein occlusion, left eye, with macular edema: Secondary | ICD-10-CM | POA: Diagnosis not present

## 2019-02-19 DIAGNOSIS — H3582 Retinal ischemia: Secondary | ICD-10-CM | POA: Diagnosis not present

## 2019-03-29 ENCOUNTER — Telehealth: Payer: Self-pay | Admitting: Family Medicine

## 2019-03-29 DIAGNOSIS — D696 Thrombocytopenia, unspecified: Secondary | ICD-10-CM

## 2019-03-29 DIAGNOSIS — E1140 Type 2 diabetes mellitus with diabetic neuropathy, unspecified: Secondary | ICD-10-CM

## 2019-03-29 DIAGNOSIS — E785 Hyperlipidemia, unspecified: Secondary | ICD-10-CM

## 2019-03-29 DIAGNOSIS — E538 Deficiency of other specified B group vitamins: Secondary | ICD-10-CM

## 2019-03-29 DIAGNOSIS — E1165 Type 2 diabetes mellitus with hyperglycemia: Secondary | ICD-10-CM

## 2019-03-29 DIAGNOSIS — E1169 Type 2 diabetes mellitus with other specified complication: Secondary | ICD-10-CM

## 2019-03-29 DIAGNOSIS — I10 Essential (primary) hypertension: Secondary | ICD-10-CM

## 2019-03-29 NOTE — Telephone Encounter (Signed)
-----   Message from Cloyd Stagers, RT sent at 03/25/2019  1:09 PM EST ----- Regarding: Lab Orders for Monday 1.11.2021 Please place lab orders for Monday 1.11.2021, 6 month f/u in office on Friday 1.15.2021 Thank you, Dyke Maes RT(R)

## 2019-03-31 ENCOUNTER — Other Ambulatory Visit (INDEPENDENT_AMBULATORY_CARE_PROVIDER_SITE_OTHER): Payer: Medicare Other

## 2019-03-31 ENCOUNTER — Other Ambulatory Visit: Payer: Self-pay

## 2019-03-31 DIAGNOSIS — E1169 Type 2 diabetes mellitus with other specified complication: Secondary | ICD-10-CM

## 2019-03-31 DIAGNOSIS — E785 Hyperlipidemia, unspecified: Secondary | ICD-10-CM | POA: Diagnosis not present

## 2019-03-31 DIAGNOSIS — E1165 Type 2 diabetes mellitus with hyperglycemia: Secondary | ICD-10-CM | POA: Diagnosis not present

## 2019-03-31 DIAGNOSIS — E1140 Type 2 diabetes mellitus with diabetic neuropathy, unspecified: Secondary | ICD-10-CM

## 2019-03-31 DIAGNOSIS — I10 Essential (primary) hypertension: Secondary | ICD-10-CM | POA: Diagnosis not present

## 2019-03-31 DIAGNOSIS — E538 Deficiency of other specified B group vitamins: Secondary | ICD-10-CM

## 2019-03-31 DIAGNOSIS — D696 Thrombocytopenia, unspecified: Secondary | ICD-10-CM | POA: Diagnosis not present

## 2019-03-31 LAB — COMPREHENSIVE METABOLIC PANEL
ALT: 23 U/L (ref 0–35)
AST: 36 U/L (ref 0–37)
Albumin: 4.4 g/dL (ref 3.5–5.2)
Alkaline Phosphatase: 84 U/L (ref 39–117)
BUN: 17 mg/dL (ref 6–23)
CO2: 31 mEq/L (ref 19–32)
Calcium: 9.6 mg/dL (ref 8.4–10.5)
Chloride: 101 mEq/L (ref 96–112)
Creatinine, Ser: 0.99 mg/dL (ref 0.40–1.20)
GFR: 55.2 mL/min — ABNORMAL LOW (ref >60.00)
Glucose, Bld: 154 mg/dL — ABNORMAL HIGH (ref 70–99)
Potassium: 3.7 mEq/L (ref 3.5–5.1)
Sodium: 142 mEq/L (ref 135–145)
Total Bilirubin: 0.3 mg/dL (ref 0.2–1.2)
Total Protein: 8 g/dL (ref 6.0–8.3)

## 2019-03-31 LAB — LIPID PANEL
Cholesterol: 165 mg/dL (ref 0–200)
HDL: 33.7 mg/dL — ABNORMAL LOW (ref >39.00)
Total CHOL/HDL Ratio: 5
Triglycerides: 439 mg/dL — ABNORMAL HIGH (ref 0.0–149.0)

## 2019-03-31 LAB — CBC WITH DIFFERENTIAL/PLATELET
Basophils Absolute: 0 10*3/uL (ref 0.0–0.1)
Basophils Relative: 0.4 % (ref 0.0–3.0)
Eosinophils Absolute: 0.1 10*3/uL (ref 0.0–0.7)
Eosinophils Relative: 0.9 % (ref 0.0–5.0)
HCT: 37.9 % (ref 36.0–46.0)
Hemoglobin: 12.2 g/dL (ref 12.0–15.0)
Lymphocytes Relative: 27.5 % (ref 12.0–46.0)
Lymphs Abs: 1.5 10*3/uL (ref 0.7–4.0)
MCHC: 32.2 g/dL (ref 30.0–36.0)
MCV: 82 fl (ref 78.0–100.0)
Monocytes Absolute: 0.4 10*3/uL (ref 0.1–1.0)
Monocytes Relative: 7.4 % (ref 3.0–12.0)
Neutro Abs: 3.6 10*3/uL (ref 1.4–7.7)
Neutrophils Relative %: 63.8 % (ref 43.0–77.0)
Platelets: 113 10*3/uL — ABNORMAL LOW (ref 150.0–400.0)
RBC: 4.63 Mil/uL (ref 3.87–5.11)
RDW: 15.8 % — ABNORMAL HIGH (ref 11.5–15.5)
WBC: 5.6 10*3/uL (ref 4.0–10.5)

## 2019-03-31 LAB — HEMOGLOBIN A1C: Hgb A1c MFr Bld: 8.2 % — ABNORMAL HIGH (ref 4.6–6.5)

## 2019-03-31 LAB — LDL CHOLESTEROL, DIRECT: Direct LDL: 83 mg/dL

## 2019-03-31 LAB — VITAMIN B12: Vitamin B-12: 451 pg/mL (ref 211–911)

## 2019-04-04 ENCOUNTER — Ambulatory Visit: Payer: Medicare Other | Admitting: Family Medicine

## 2019-04-04 ENCOUNTER — Other Ambulatory Visit: Payer: Self-pay

## 2019-04-09 ENCOUNTER — Encounter: Payer: Self-pay | Admitting: Family Medicine

## 2019-04-09 ENCOUNTER — Other Ambulatory Visit: Payer: Self-pay

## 2019-04-09 ENCOUNTER — Ambulatory Visit (INDEPENDENT_AMBULATORY_CARE_PROVIDER_SITE_OTHER): Payer: Medicare Other | Admitting: Family Medicine

## 2019-04-09 ENCOUNTER — Ambulatory Visit (INDEPENDENT_AMBULATORY_CARE_PROVIDER_SITE_OTHER)
Admission: RE | Admit: 2019-04-09 | Discharge: 2019-04-09 | Disposition: A | Discharge status: Home / Self Care | Payer: Medicare Other | Source: Ambulatory Visit | Attending: Family Medicine | Admitting: Family Medicine

## 2019-04-09 VITALS — BP 138/75 | HR 69 | Temp 96.8°F | Ht 63.0 in | Wt 153.2 lb

## 2019-04-09 DIAGNOSIS — R0789 Other chest pain: Secondary | ICD-10-CM | POA: Diagnosis not present

## 2019-04-09 DIAGNOSIS — I10 Essential (primary) hypertension: Secondary | ICD-10-CM | POA: Diagnosis not present

## 2019-04-09 DIAGNOSIS — E1169 Type 2 diabetes mellitus with other specified complication: Secondary | ICD-10-CM | POA: Diagnosis not present

## 2019-04-09 DIAGNOSIS — M79671 Pain in right foot: Secondary | ICD-10-CM | POA: Diagnosis not present

## 2019-04-09 DIAGNOSIS — Z853 Personal history of malignant neoplasm of breast: Secondary | ICD-10-CM | POA: Diagnosis not present

## 2019-04-09 DIAGNOSIS — E1140 Type 2 diabetes mellitus with diabetic neuropathy, unspecified: Secondary | ICD-10-CM

## 2019-04-09 DIAGNOSIS — E538 Deficiency of other specified B group vitamins: Secondary | ICD-10-CM | POA: Diagnosis not present

## 2019-04-09 DIAGNOSIS — E039 Hypothyroidism, unspecified: Secondary | ICD-10-CM

## 2019-04-09 DIAGNOSIS — D696 Thrombocytopenia, unspecified: Secondary | ICD-10-CM | POA: Diagnosis not present

## 2019-04-09 DIAGNOSIS — E1165 Type 2 diabetes mellitus with hyperglycemia: Secondary | ICD-10-CM | POA: Diagnosis not present

## 2019-04-09 DIAGNOSIS — E1142 Type 2 diabetes mellitus with diabetic polyneuropathy: Secondary | ICD-10-CM

## 2019-04-09 DIAGNOSIS — E785 Hyperlipidemia, unspecified: Secondary | ICD-10-CM

## 2019-04-09 MED ORDER — ROSUVASTATIN CALCIUM 10 MG PO TABS: ORAL_TABLET | ORAL | 11 refills | Status: DC

## 2019-04-09 NOTE — Assessment & Plan Note (Signed)
Platelet ct is stable at 113 No bleeding or bruising  Will continue to follow

## 2019-04-09 NOTE — Assessment & Plan Note (Signed)
Intolerant of statins in the past Disc goals for lipids and reasons to control them Rev last labs with pt Rev low sat fat diet in detail  Triglycerides remain high (with high blood sugar)  Pt is agreeable to try crestor 10 mg twice weekly (if tolerated will check lab in 6 wk)

## 2019-04-09 NOTE — Assessment & Plan Note (Signed)
L sided  Chest vs chest wall -at area of prev mastectomy  EKG is re assuring  inst to try heat (possibly new pillow and update)  Low threshold to w/u further

## 2019-04-09 NOTE — Assessment & Plan Note (Signed)
bp in fair control at this time  BP Readings from Last 1 Encounters:  04/09/19 138/75   No changes needed Most recent labs reviewed  Disc lifstyle change with low sodium diet and exercise

## 2019-04-09 NOTE — Progress Notes (Signed)
Subjective:    Patient ID: Valerie Ball, female    DOB: 1945/04/21, 74 y.o.   MRN: [id number] This visit occurred during the SARS-CoV-2 public health emergency.  Safety protocols were in place, including screening questions prior to the visit, additional usage of staff PPE, and extensive cleaning of exam room while observing appropriate contact time as indicated for disinfecting solutions.    HPI Pt presents for 6 mo f/u of chronic health problems   Wt Readings from Last 3 Encounters:  04/09/19 153 lb 4 oz (69.5 kg)  10/02/18 152 lb 6 oz (69.1 kg)  06/04/18 154 lb 5 oz (70 kg)  stable weight  27.15 kg/m   Trying to take care of herself  Is lazy  No exercise -very little  She cannot motivate herself - has never had any energy  Cannot walk due to orthopedic issues   bp is stable today  No cp or palpitations or headaches or edema  No side effects to medicines  BP Readings from Last 3 Encounters:  04/09/19 (!) 142/70  10/02/18 128/80  06/04/18 131/80      DM2 Not well controlled  Lab Results  Component Value Date   HGBA1C 8.2 (H) 03/31/2019   Last time 7.8  Had to stop metformin xr due to GI issues- she takes it but has diarrhea all the time  Intol of glipizide (hypoglycemia) januvia-taking   Not exercising  Diet - making an effort  She eats fruits and veggies  She does not eat bread  Does eat too much pasta and white potatoes  Not a sweet eater   She continues to see the opthy/retinal specialist  Laser surgery last week  Getting shots  Hopeful this will help   She checks glucose occ- lowest 136   Usually under 200  ARB for renal protection  zetia for choleterol   Hyperlipidemia Lab Results  Component Value Date   CHOL 165 03/31/2019   CHOL 171 09/27/2018   CHOL 171 05/20/2018   Lab Results  Component Value Date   HDL 33.70 (L) 03/31/2019   HDL 34.50 (L) 09/27/2018   HDL 29.30 (L) 05/20/2018   No results found for: [facility] Lab Results    Component Value Date   TRIG (H) 03/31/2019    439.0 Triglyceride is over 400; calculations on Lipids are invalid.   TRIG 358.0 (H) 09/27/2018   TRIG (H) 05/20/2018    539.0 Triglyceride is over 400; calculations on Lipids are invalid.   Lab Results  Component Value Date   CHOLHDL 5 03/31/2019   CHOLHDL 5 09/27/2018   CHOLHDL 6 05/20/2018   Lab Results  Component Value Date   LDLDIRECT 83.0 03/31/2019   LDLDIRECT 105.0 09/27/2018   LDLDIRECT 95.0 05/20/2018   Intolerant of statins Triglycerides are quite high   Hypothyroidism  Pt has no clinical changes No change in energy level/ hair or skin/ edema and no tremor Lab Results  Component Value Date   TSH 4.36 09/27/2018      H/o thrombocytopenia Lab Results  Component Value Date   WBC 5.6 03/31/2019   HGB 12.2 03/31/2019   HCT 37.9 03/31/2019   MCV 82.0 03/31/2019   PLT 113.0 (L) 03/31/2019  up from 111  No excessive bleeding/bruising   Vit B12 def Lab Results  Component Value Date   VITAMINB12 451 03/31/2019   Was high prev and cut dose in 1/2   occ has L chest pain when lying in bed  Eases off  Better during the day  Radiates to the back and axilla  No sob or exertional symptoms  EKG today: NSR rate of 85 with no acute changes from previous EKG  Patient Active Problem List   Diagnosis Date Noted  . Right foot pain 04/09/2019  . Chest discomfort 04/09/2019  . Rectal bleeding 05/20/2018  . Fatigue 05/20/2018  . Hip pain, bilateral 08/31/2017  . Vitamin B12 deficiency 09/21/2016  . Benign neoplasm of tongue 04/11/2016  . Diabetic gastroparesis ([place]) 03/27/2016  . Retinal vein occlusion, branch 01/03/2016  . Alternating constipation and diarrhea 08/12/2015  . AVM (arteriovenous malformation) of colon 05/18/2015  . IBS (irritable bowel syndrome) 02/28/2015  . Diverticulitis of colon 10/14/2014  . Gout 07/22/2014  . Hypothyroidism 04/23/2014  . Nausea without vomiting 02/18/2014  . Encounter for  Medicare annual wellness exam 01/23/2014  . Estrogen deficiency 01/23/2014  . Colon cancer screening 01/23/2014  . Tremor 01/20/2014  . Allergic rhinitis 01/07/2014  . Thrombocytopenia ([place]) 11/25/2013  . Diabetic neuropathy ([place]) 11/25/2013  . Lap Nissen October 2013 01/19/2012  . Large type III mixed hiatus hernia  12/06/2011  . Stress reaction 10/25/2011  . Routine general medical examination at a health care facility 07/11/2011  . TRANSAMINASES, SERUM, ELEVATED 11/11/2009  . History of breast cancer 08/02/2009  . DIVERTICULOSIS-COLON 03/03/2008  . ESOPHAGEAL STRICTURE 03/02/2008  . HIATAL HERNIA 03/02/2008  . PERITONEAL ADHESIONS 03/02/2008  . Anemia 02/07/2008  . Poorly controlled type 2 diabetes mellitus with neuropathy ([place]) 08/19/2007  . ANXIETY 04/15/2007  . DISORDERS, ORGANIC SLEEP NEC 08/23/2006  . SYNDROME, RESTLESS LEGS 08/23/2006  . SYNDROME, CARPAL TUNNEL 08/23/2006  . Hyperlipidemia associated with type 2 diabetes mellitus ([place]) 08/22/2006  . Essential hypertension 08/22/2006  . ALLERGIC RHINITIS 08/22/2006  . GERD 08/22/2006  . Fatty liver 08/22/2006  . History of seizure 08/22/2006   Past Medical History:  Diagnosis Date  . Allergy    allegic rhinitis  . Anemia   . Angiodysplasia of colon   . Anxiety   . Arthritis   . Blood transfusion   . Borderline diabetic    per patient medical history form  . Breast cancer ([place]) 02/2009   breast CA L invasive ductal CA(hormone receptor positive.)  . Cataract   . Colon polyp    hyperplastic  . Colon stricture ([place]) 01/14/2003  . Diabetes mellitus    pt's medical doctor took her off diabetic meds-blood sugars have not been a problem--and medications were causing pt's sugars to be too low  . Diverticulosis   . Esophageal stricture 02/04/2008  . GERD (gastroesophageal reflux disease)   . GI bleed   . Hepatic steatosis   . Hiatal hernia   . Hyperlipidemia   . Hypertension   . Hypothyroidism   . IBS  (irritable bowel syndrome)   . Melena 01/2008   anemia transfusion  . Menopause    per patient medical history form  . Osteoporosis   . Postmenopausal hormone therapy    per patient medical history form.  Marland Kitchen RLS (restless legs syndrome)   . Salmonella 05/2000   renal effects secondary to dehydration  . Seizure disorder ([place])   . Seizures ([place])    one seizure several yrs ago --etiology unknown-no problem since   Past Surgical History:  Procedure Laterality Date  . ABDOMINAL HYSTERECTOMY  1983  . BREAST SURGERY  02/2009   breast biopsy invasive ductal CA-bilateral mastectomies  . EPIGASTRIC HERNIA REPAIR  01/02/2012  Procedure: HERNIA REPAIR EPIGASTRIC ADULT;  Surgeon: Pedro Earls, MD;  Location: [facility];  Service: General;  Laterality: N/A;  Laparoscopic Repair Paraesophageal Hernia, Nissen  . EYE SURGERY Left 07/2017   Dr. Zadie Rhine  . LAPAROSCOPIC NISSEN FUNDOPLICATION  [id number]   Procedure: LAPAROSCOPIC NISSEN FUNDOPLICATION;  Surgeon: Pedro Earls, MD;  Location: [facility];  Service: General;  Laterality: N/A;  . MASTECTOMY  04/2009   Bilateral for ductal carcinoma on L  . OVARY SURGERY  1986   ovarian tumor removed   Social History   Tobacco Use  . Smoking status: Never Smoker  . Smokeless tobacco: Never Used  Substance Use Topics  . Alcohol use: No    Alcohol/week: 0.0 standard drinks  . Drug use: No   Family History  Problem Relation Age of Onset  . Heart attack Father   . Diabetes Father   . Esophageal cancer Father   . Breast cancer Sister        x 2 sisters  . Diabetes Sister   . Hypertension Brother   . Diabetes Brother   . Hypertension Mother   . Stroke Mother   . Breast cancer Mother   . Hypertension Sister    Allergies  Allergen Reactions  . Arimidex [Anastrozole]     Joint pain  . Glipizide Other (See Comments)    Hypoglycemia   . Metformin And Related     diarrhea  . Penicillins Rash   Current Outpatient Medications on File Prior  to Visit  Medication Sig Dispense Refill  . amLODipine (NORVASC) 5 MG tablet TAKE 1 TABLET DAILY 90 tablet 0  . aspirin EC 81 MG tablet Take 81 mg by mouth daily.    Marland Kitchen ezetimibe (ZETIA) 10 MG tablet TAKE 1 TABLET DAILY 90 tablet 0  . fluticasone (FLONASE) 50 MCG/ACT nasal spray Place 2 sprays into both nostrils daily as needed. Allergies 48 g 3  . glucose blood (FREESTYLE LITE) test strip Use to check blood sugar once daily and as needed (diagnosis E11.65) 100 each 1  . hydrochlorothiazide (HYDRODIURIL) 25 MG tablet TAKE 1 TABLET DAILY 90 tablet 0  . JANUVIA 100 MG tablet TAKE 1 TABLET DAILY 90 tablet 0  . levothyroxine (SYNTHROID) 50 MCG tablet TAKE 1 TABLET DAILY BEFORE BREAKFAST 90 tablet 0  . losartan (COZAAR) 100 MG tablet Take 1 tablet (100 mg total) by mouth daily. 90 tablet 1  . metFORMIN (GLUCOPHAGE-XR) 500 MG 24 hr tablet TAKE 3 TABLETS DAILY WITH BREAKFAST 270 tablet 0  . Multiple Vitamin (MULTIVITAMIN WITH MINERALS) TABS tablet Take 1 tablet by mouth daily.    Marland Kitchen omeprazole (PRILOSEC) 20 MG capsule TAKE 1 CAPSULE TWICE A DAY BEFORE MEALS 180 capsule 0  . rOPINIRole (REQUIP) 1 MG tablet TAKE 1 TABLET AT BEDTIME 90 tablet 0  . sertraline (ZOLOFT) 100 MG tablet TAKE 1 TABLET AT BEDTIME 90 tablet 0  . [DISCONTINUED] cetirizine (ZYRTEC) 10 MG tablet Take 10 mg by mouth daily as needed. For allergy symptoms     No current facility-administered medications on file prior to visit.    Review of Systems  Constitutional: Positive for fatigue. Negative for activity change, appetite change, fever and unexpected weight change.  HENT: Negative for congestion, ear pain, rhinorrhea, sinus pressure and sore throat.   Eyes: Negative for pain, redness and visual disturbance.  Respiratory: Negative for cough, shortness of breath and wheezing.   Cardiovascular: Positive for chest pain. Negative for palpitations and leg swelling.  Gastrointestinal:  Negative for abdominal pain, blood in stool,  constipation and diarrhea.  Endocrine: Negative for polydipsia, polyphagia and polyuria.  Genitourinary: Negative for dysuria, frequency and urgency.  Musculoskeletal: Positive for arthralgias. Negative for back pain and myalgias.       R foot pain  Skin: Negative for pallor and rash.  Allergic/Immunologic: Negative for environmental allergies.  Neurological: Positive for numbness. Negative for dizziness, syncope and headaches.  Hematological: Negative for adenopathy. Does not bruise/bleed easily.  Psychiatric/Behavioral: Negative for decreased concentration and dysphoric mood. The patient is not nervous/anxious.        Objective:   Physical Exam Constitutional:      General: She is not in acute distress.    Appearance: Normal appearance. She is well-developed. She is not ill-appearing or diaphoretic.  HENT:     Head: Normocephalic and atraumatic.     Mouth/Throat:     Mouth: Mucous membranes are moist.  Eyes:     General: No scleral icterus.    Conjunctiva/sclera: Conjunctivae normal.     Pupils: Pupils are equal, round, and reactive to light.  Neck:     Thyroid: No thyromegaly.     Vascular: No carotid bruit or JVD.  Cardiovascular:     Rate and Rhythm: Normal rate and regular rhythm.     Pulses: Normal pulses.     Heart sounds: Normal heart sounds. No gallop.   Pulmonary:     Effort: Pulmonary effort is normal. No respiratory distress.     Breath sounds: Normal breath sounds. No wheezing or rales.  Chest:     Chest wall: No tenderness.  Abdominal:     General: Bowel sounds are normal. There is no distension or abdominal bruit.     Palpations: Abdomen is soft. There is no mass.     Tenderness: There is no abdominal tenderness. There is no right CVA tenderness, left CVA tenderness, guarding or rebound.     Hernia: No hernia is present.  Musculoskeletal:     Cervical back: Normal range of motion and neck supple. No tenderness.     Right lower leg: No edema.     Left  lower leg: No edema.     Comments: L foot-some very mild dorsal swelling but no point tenderness Dorsiflexion causes foot pain  Nl gait  No ecchymosis   Lymphadenopathy:     Cervical: No cervical adenopathy.  Skin:    General: Skin is warm and dry.     Coloration: Skin is not pale.     Findings: No erythema or rash.  Neurological:     Mental Status: She is alert. Mental status is at baseline.     Cranial Nerves: No cranial nerve deficit.     Motor: No weakness.     Coordination: Coordination normal.     Deep Tendon Reflexes: Reflexes are normal and symmetric. Reflexes normal.  Psychiatric:        Mood and Affect: Mood normal.           Assessment & Plan:   Problem List Items Addressed This Visit      Cardiovascular and Mediastinum   Essential hypertension    bp in fair control at this time  BP Readings from Last 1 Encounters:  04/09/19 138/75   No changes needed Most recent labs reviewed  Disc lifstyle change with low sodium diet and exercise        Relevant Medications   rosuvastatin (CRESTOR) 10 MG tablet   Other Relevant Orders  EKG 12-Lead (Completed)     Endocrine   Poorly controlled type 2 diabetes mellitus with neuropathy ([place]) - Primary    Lab Results  Component Value Date   HGBA1C 8.2 (H) 03/31/2019   This is going up steadily despite no change in diet  Metformin causing diarrhea Pt develops hypoglycemia with even low dose of glipizide Currently tolerating [place]  Apprehensive about insulin in light of hypoglycemia  Will refer to endocrinology for help with DM management  Trial of twice week crestor for cholesterol  Taking arb for renal protection Continues close opthy f/u      Relevant Medications   rosuvastatin (CRESTOR) 10 MG tablet   Other Relevant Orders   Ambulatory referral to Endocrinology   Hyperlipidemia associated with type 2 diabetes mellitus ([place])    Intolerant of statins in the past Disc goals for lipids and reasons to  control them Rev last labs with pt Rev low sat fat diet in detail  Triglycerides remain high (with high blood sugar)  Pt is agreeable to try crestor 10 mg twice weekly (if tolerated will check lab in 6 wk)        Relevant Medications   rosuvastatin (CRESTOR) 10 MG tablet   Diabetic neuropathy ([facility])    No clinical changes      Relevant Medications   rosuvastatin (CRESTOR) 10 MG tablet   Hypothyroidism     Other   History of breast cancer    Pt has had some atypical chest/chest wall pain around site of L mastectomy        Thrombocytopenia ([facility])    Platelet ct is stable at 113 No bleeding or bruising  Will continue to follow      Vitamin B12 deficiency    Lab Results  Component Value Date   VITAMINB12 451 03/31/2019   In nl range after cutting dose in 1/2  Will continue this       Right foot pain    Dorsal lateral R foot Very slt puffiness  No tenderness but pain on dorsiflexion  Xray ordered to r/o stress fx Adv ice prn/ elevation and use of supportive shoe       Relevant Orders   DG Foot Complete Right (Completed)   Chest discomfort    L sided  Chest vs chest wall -at area of prev mastectomy  EKG is re assuring  inst to try heat (possibly new pillow and update)  Low threshold to w/u further        Relevant Orders   EKG 12-Lead (Completed)

## 2019-04-09 NOTE — Assessment & Plan Note (Signed)
No clinical changes 

## 2019-04-09 NOTE — Assessment & Plan Note (Signed)
Lab Results  Component Value Date   [id number] 03/31/2019   In nl range after cutting dose in 1/2  Will continue this

## 2019-04-09 NOTE — Patient Instructions (Addendum)
Try ice on your foot if tolerated  Wear a sturdy /supportive shoe Xray now - we will call with result   Try the crestor twice weekly  If any intolerable side effects stop it and let us know   We will check labs in 6 weeks   The office will call you re: endocrinology referral for diabetes   Keep working on healthy diet and exercise   EKG today

## 2019-04-09 NOTE — Assessment & Plan Note (Signed)
Pt has had some atypical chest/chest wall pain around site of L mastectomy

## 2019-04-09 NOTE — Assessment & Plan Note (Signed)
Dorsal lateral R foot Very slt puffiness  No tenderness but pain on dorsiflexion  Xray ordered to r/o stress fx Adv ice prn/ elevation and use of supportive shoe

## 2019-04-09 NOTE — Assessment & Plan Note (Signed)
Lab Results  Component Value Date   HGBA1C 8.2 (H) 03/31/2019   This is going up steadily despite no change in diet  Metformin causing diarrhea Pt develops hypoglycemia with even low dose of glipizide Currently tolerating [place]  Apprehensive about insulin in light of hypoglycemia  Will refer to endocrinology for help with DM management  Trial of twice week crestor for cholesterol  Taking arb for renal protection Continues close opthy f/u

## 2019-04-15 ENCOUNTER — Other Ambulatory Visit: Payer: Self-pay | Admitting: Family Medicine

## 2019-04-16 NOTE — Telephone Encounter (Signed)
6 month f/u appt was on 04/09/19, both meds last filled on 01/15/19 #90 tabs with 0 refills, please advise

## 2019-04-17 DIAGNOSIS — H3582 Retinal ischemia: Secondary | ICD-10-CM | POA: Diagnosis not present

## 2019-04-17 DIAGNOSIS — H35352 Cystoid macular degeneration, left eye: Secondary | ICD-10-CM | POA: Diagnosis not present

## 2019-04-17 DIAGNOSIS — H34832 Tributary (branch) retinal vein occlusion, left eye, with macular edema: Secondary | ICD-10-CM | POA: Diagnosis not present

## 2019-04-17 DIAGNOSIS — H35032 Hypertensive retinopathy, left eye: Secondary | ICD-10-CM | POA: Diagnosis not present

## 2019-04-23 ENCOUNTER — Other Ambulatory Visit: Payer: Self-pay | Admitting: Family Medicine

## 2019-05-06 ENCOUNTER — Ambulatory Visit (INDEPENDENT_AMBULATORY_CARE_PROVIDER_SITE_OTHER): Payer: Medicare Other | Admitting: Internal Medicine

## 2019-05-06 ENCOUNTER — Encounter: Payer: Self-pay | Admitting: Internal Medicine

## 2019-05-06 ENCOUNTER — Other Ambulatory Visit: Payer: Self-pay

## 2019-05-06 VITALS — BP 158/80 | HR 95 | Ht 63.0 in | Wt 154.0 lb

## 2019-05-06 DIAGNOSIS — E1140 Type 2 diabetes mellitus with diabetic neuropathy, unspecified: Secondary | ICD-10-CM | POA: Diagnosis not present

## 2019-05-06 DIAGNOSIS — E1165 Type 2 diabetes mellitus with hyperglycemia: Secondary | ICD-10-CM

## 2019-05-06 MED ORDER — FARXIGA 5 MG PO TABS: 5.0000 mg | ORAL_TABLET | Freq: Every day | ORAL | 5 refills | Status: DC

## 2019-05-06 NOTE — Patient Instructions (Addendum)
Stop Metformin ER.  Start: - Farxiga 5 mg before b'fast  Continue: - Januvia 100 mg before b'fast  Can try the following for neuropathy: - alpha-lipoic acid 600 mg 2x a day  Please let me know if the sugars are consistently <80 or >200.  Please return in 2-3 months with your sugar log.   PATIENT INSTRUCTIONS FOR TYPE 2 DIABETES:  DIET AND EXERCISE Diet and exercise is an important part of diabetic treatment.  We recommended aerobic exercise in the form of brisk walking (working between 40-60% of maximal aerobic capacity, similar to brisk walking) for 150 minutes per week (such as 30 minutes five days per week) along with 3 times per week performing 'resistance' training (using various gauge rubber tubes with handles) 5-10 exercises involving the major muscle groups (upper body, lower body and core) performing 10-15 repetitions (or near fatigue) each exercise. Start at half the above goal but build slowly to reach the above goals. If limited by weight, joint pain, or disability, we recommend daily walking in a swimming pool with water up to waist to reduce pressure from joints while allow for adequate exercise.    BLOOD GLUCOSES Monitoring your blood glucoses is important for continued management of your diabetes. Please check your blood glucoses 2-4 times a day: fasting, before meals and at bedtime (you can rotate these measurements - e.g. one day check before the 3 meals, the next day check before 2 of the meals and before bedtime, etc.).   HYPOGLYCEMIA (low blood sugar) Hypoglycemia is usually a reaction to not eating, exercising, or taking too much insulin/ other diabetes drugs.  Symptoms include tremors, sweating, hunger, confusion, headache, etc. Treat IMMEDIATELY with 15 grams of Carbs: . 4 glucose tablets .  cup regular juice/soda . 2 tablespoons raisins . 4 teaspoons sugar . 1 tablespoon honey Recheck blood glucose in 15 mins and repeat above if still symptomatic/blood  glucose <100.  RECOMMENDATIONS TO REDUCE YOUR RISK OF DIABETIC COMPLICATIONS: * Take your prescribed MEDICATION(S) * Follow a DIABETIC diet: Complex carbs, fiber rich foods, (monounsaturated and polyunsaturated) fats * AVOID saturated/trans fats, high fat foods, >2,300 mg salt per day. * EXERCISE at least 5 times a week for 30 minutes or preferably daily.  * DO NOT SMOKE OR DRINK more than 1 drink a day. * Check your FEET every day. Do not wear tightfitting shoes. Contact us if you develop an ulcer * See your EYE doctor once a year or more if needed * Get a FLU shot once a year * Get a PNEUMONIA vaccine once before and once after age 56 years  GOALS:  * Your Hemoglobin A1c of <7%  * fasting sugars need to be <130 * after meals sugars need to be <180 (2h after you start eating) * Your Systolic BP should be [id number] or lower  * Your Diastolic BP should be 80 or lower  * Your HDL (Good Cholesterol) should be 40 or higher  * Your LDL (Bad Cholesterol) should be 100 or lower. * Your Triglycerides should be 150 or lower  * Your Urine microalbumin (kidney function) should be <30 * Your Body Mass Index should be 25 or lower    Please consider the following ways to cut down carbs and fat and increase fiber and micronutrients in your diet: - substitute whole grain for white bread or pasta - substitute brown rice for white rice - substitute 90-calorie flat bread pieces for slices of bread when possible - substitute sweet  potatoes or yams for white potatoes - substitute humus for margarine - substitute tofu for cheese when possible - substitute almond or rice milk for regular milk (would not drink soy milk daily due to concern for soy estrogen influence on breast cancer risk) - substitute dark chocolate for other sweets when possible - substitute water - can add lemon or orange slices for taste - for diet sodas (artificial sweeteners will trick your body that you can eat sweets without getting  calories and will lead you to overeating and weight gain in the long run) - do not skip breakfast or other meals (this will slow down the metabolism and will result in more weight gain over time)  - can try smoothies made from fruit and almond/rice milk in am instead of regular breakfast - can also try old-fashioned (not instant) oatmeal made with almond/rice milk in am - order the dressing on the side when eating salad at a restaurant (pour less than half of the dressing on the salad) - eat as little meat as possible - can try juicing, but should not forget that juicing will get rid of the fiber, so would alternate with eating raw veg./fruits or drinking smoothies - use as little oil as possible, even when using olive oil - can dress a salad with a mix of balsamic vinegar and lemon juice, for e.g. - use agave nectar, stevia sugar, or regular sugar rather than artificial sweateners - steam or broil/roast veggies  - snack on veggies/fruit/nuts (unsalted, preferably) when possible, rather than processed foods - reduce or eliminate aspartame in diet (it is in diet sodas, chewing gum, etc) Read the labels!  Try to read Dr. Janene Harvey book: "Program for Reversing Diabetes" for other ideas for healthy eating.
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
## 2019-05-07 ENCOUNTER — Encounter: Payer: Self-pay | Admitting: Internal Medicine

## 2019-05-09 MED ORDER — ALPHA-LIPOIC ACID 600 MG PO TABS: 600.0000 mg | ORAL_TABLET | Freq: Two times a day (BID) | ORAL | 3 refills | Status: DC

## 2019-05-09 MED ORDER — FARXIGA 5 MG PO TABS: 5.0000 mg | ORAL_TABLET | Freq: Every day | ORAL | 5 refills | Status: DC

## 2019-05-09 NOTE — Telephone Encounter (Signed)
Patient requesting RX for farxiga to go through the mail service instead of [facility].  [place], [place] Phone:  [phone]  Fax:  [phone]

## 2019-05-12 ENCOUNTER — Other Ambulatory Visit: Payer: Self-pay | Admitting: *Deleted

## 2019-05-12 MED ORDER — ROSUVASTATIN CALCIUM 10 MG PO TABS: ORAL_TABLET | ORAL | 3 refills | Status: DC

## 2019-05-12 NOTE — Telephone Encounter (Signed)
Received fax requesting Rx be sent to Mail order pharmacy, done

## 2019-05-13 MED ORDER — JARDIANCE 10 MG PO TABS: 10.0000 mg | ORAL_TABLET | Freq: Every day | ORAL | 1 refills | Status: DC

## 2019-05-13 NOTE — Addendum Note (Signed)
Addended by: Cardell Peach I on: 05/13/2019 01:16 PM   Modules accepted: Orders

## 2019-05-19 ENCOUNTER — Telehealth: Payer: Self-pay

## 2019-05-19 NOTE — Telephone Encounter (Signed)
LVM w COVID screen and back lab info 3.1.2021 TLJ

## 2019-05-21 ENCOUNTER — Other Ambulatory Visit (INDEPENDENT_AMBULATORY_CARE_PROVIDER_SITE_OTHER): Payer: Medicare Other

## 2019-05-21 ENCOUNTER — Other Ambulatory Visit: Payer: Self-pay

## 2019-05-21 DIAGNOSIS — E785 Hyperlipidemia, unspecified: Secondary | ICD-10-CM | POA: Diagnosis not present

## 2019-05-21 DIAGNOSIS — E1169 Type 2 diabetes mellitus with other specified complication: Secondary | ICD-10-CM | POA: Diagnosis not present

## 2019-05-21 LAB — LIPID PANEL
Cholesterol: 102 mg/dL (ref 0–200)
HDL: 27 mg/dL — ABNORMAL LOW (ref >39.00)
NonHDL: 75.23
Total CHOL/HDL Ratio: 4
Triglycerides: 246 mg/dL — ABNORMAL HIGH (ref 0.0–149.0)
VLDL: 49.2 mg/dL — ABNORMAL HIGH (ref 0.0–40.0)

## 2019-05-21 LAB — LDL CHOLESTEROL, DIRECT: Direct LDL: 48 mg/dL

## 2019-05-21 LAB — ALT: ALT: 22 U/L (ref 0–35)

## 2019-05-21 LAB — AST: AST: 33 U/L (ref 0–37)

## 2019-05-22 ENCOUNTER — Encounter: Payer: Self-pay | Admitting: *Deleted

## 2019-05-27 DIAGNOSIS — H43392 Other vitreous opacities, left eye: Secondary | ICD-10-CM | POA: Diagnosis not present

## 2019-05-27 DIAGNOSIS — H35352 Cystoid macular degeneration, left eye: Secondary | ICD-10-CM | POA: Diagnosis not present

## 2019-05-27 DIAGNOSIS — H3562 Retinal hemorrhage, left eye: Secondary | ICD-10-CM | POA: Diagnosis not present

## 2019-05-27 DIAGNOSIS — H34832 Tributary (branch) retinal vein occlusion, left eye, with macular edema: Secondary | ICD-10-CM | POA: Diagnosis not present

## 2019-07-08 ENCOUNTER — Encounter (INDEPENDENT_AMBULATORY_CARE_PROVIDER_SITE_OTHER): Payer: Medicare Other | Admitting: Ophthalmology

## 2019-07-13 ENCOUNTER — Encounter: Payer: Self-pay | Admitting: Internal Medicine

## 2019-07-14 ENCOUNTER — Encounter (INDEPENDENT_AMBULATORY_CARE_PROVIDER_SITE_OTHER): Payer: Self-pay | Admitting: Ophthalmology

## 2019-07-14 ENCOUNTER — Other Ambulatory Visit: Payer: Self-pay

## 2019-07-14 ENCOUNTER — Ambulatory Visit (INDEPENDENT_AMBULATORY_CARE_PROVIDER_SITE_OTHER): Payer: Medicare Other | Admitting: Ophthalmology

## 2019-07-14 DIAGNOSIS — H35352 Cystoid macular degeneration, left eye: Secondary | ICD-10-CM

## 2019-07-14 DIAGNOSIS — H35032 Hypertensive retinopathy, left eye: Secondary | ICD-10-CM

## 2019-07-14 DIAGNOSIS — H43392 Other vitreous opacities, left eye: Secondary | ICD-10-CM | POA: Insufficient documentation

## 2019-07-14 DIAGNOSIS — H34832 Tributary (branch) retinal vein occlusion, left eye, with macular edema: Secondary | ICD-10-CM

## 2019-07-14 HISTORY — DX: Cystoid macular degeneration, left eye: H35.352

## 2019-07-14 HISTORY — DX: Tributary (branch) retinal vein occlusion, left eye, with macular edema: H34.8320

## 2019-07-14 HISTORY — DX: Other vitreous opacities, left eye: H43.392

## 2019-07-14 HISTORY — DX: Hypertensive retinopathy, left eye: H35.032

## 2019-07-14 MED ORDER — BEVACIZUMAB CHEMO INJECTION 1.25MG/0.05ML SYRINGE FOR KALEIDOSCOPE
1.2500 mg | INTRAVITREAL | Status: AC | PRN
  Administered 2019-07-14: 09:00:00 1.25 mg via INTRAVITREAL

## 2019-07-14 NOTE — Progress Notes (Signed)
07/14/2019     CHIEF COMPLAINT Patient presents for Retina Follow Up   HISTORY OF PRESENT ILLNESS: Valerie Ball is a 74 y.o. female who presents to the clinic today for:   HPI    Retina Follow Up    Patient presents with  CRVO/BRVO.  In left eye.  Severity is moderate.  Since onset it is gradually improving.  I, the attending physician,  performed the HPI with the patient and updated documentation appropriately.          Comments    7 Week BRVO f\u OS. Possible Avastin OS, OCT  Pt has noticed a decrease in NVA. Pt has a hard time seeing the computer and reading price tags.  BGL: 196 A1C: 8.4       Last edited by Tilda Franco on 07/14/2019  8:28 AM. (History)      Referring physician: Tower, Wynelle Fanny, MD [place],  [place] [address]  HISTORICAL INFORMATION:   Selected notes from the MEDICAL RECORD NUMBER    Lab Results  Component Value Date   HGBA1C 8.2 (H) 03/31/2019     CURRENT MEDICATIONS: No current outpatient medications on file. (Ophthalmic Drugs)   No current facility-administered medications for this visit. (Ophthalmic Drugs)   Current Outpatient Medications (Other)  Medication Sig  . Alpha-Lipoic Acid 600 MG TABS Take 600 mg by mouth 2 (two) times daily.  Marland Kitchen amLODipine (NORVASC) 5 MG tablet TAKE 1 TABLET DAILY  . aspirin EC 81 MG tablet Take 81 mg by mouth daily.  . dapagliflozin propanediol (FARXIGA) 5 MG TABS tablet Take 5 mg by mouth daily before breakfast.  . empagliflozin (JARDIANCE) 10 MG TABS tablet Take 10 mg by mouth daily before breakfast.  . ezetimibe (ZETIA) 10 MG tablet TAKE 1 TABLET DAILY  . fluticasone (FLONASE) 50 MCG/ACT nasal spray Place 2 sprays into both nostrils daily as needed. Allergies  . glucose blood (FREESTYLE LITE) test strip Use to check blood sugar once daily and as needed (diagnosis E11.65)  . hydrochlorothiazide (HYDRODIURIL) 25 MG tablet TAKE 1 TABLET DAILY  . JANUVIA 100 MG tablet TAKE 1  TABLET DAILY  . levothyroxine (SYNTHROID) 50 MCG tablet TAKE 1 TABLET DAILY BEFORE BREAKFAST  . losartan (COZAAR) 100 MG tablet Take 1 tablet (100 mg total) by mouth daily.  . Multiple Vitamin (MULTIVITAMIN WITH MINERALS) TABS tablet Take 1 tablet by mouth daily.  Marland Kitchen omeprazole (PRILOSEC) 20 MG capsule TAKE 1 CAPSULE TWICE A DAY BEFORE MEALS  . rOPINIRole (REQUIP) 1 MG tablet TAKE 1 TABLET AT BEDTIME  . rosuvastatin (CRESTOR) 10 MG tablet Take one pill by mouth twice weekly  . sertraline (ZOLOFT) 100 MG tablet TAKE 1 TABLET AT BEDTIME   No current facility-administered medications for this visit. (Other)      REVIEW OF SYSTEMS:    ALLERGIES Allergies  Allergen Reactions  . Arimidex [Anastrozole]     Joint pain  . Glipizide Other (See Comments)    Hypoglycemia   . Metformin And Related     diarrhea  . Penicillins Rash    PAST MEDICAL HISTORY Past Medical History:  Diagnosis Date  . Allergy    allegic rhinitis  . Anemia   . Angiodysplasia of colon   . Anxiety   . Arthritis   . Blood transfusion   . Borderline diabetic    per patient medical history form  . Breast cancer ([place]) 02/2009   breast CA L invasive ductal  CA(hormone receptor positive.)  . Cataract   . Colon polyp    hyperplastic  . Colon stricture ([place]) 01/14/2003  . Diabetes mellitus    pt's medical doctor took her off diabetic meds-blood sugars have not been a problem--and medications were causing pt's sugars to be too low  . Diverticulosis   . Esophageal stricture 02/04/2008  . GERD (gastroesophageal reflux disease)   . GI bleed   . Hepatic steatosis   . Hiatal hernia   . Hyperlipidemia   . Hypertension   . Hypothyroidism   . IBS (irritable bowel syndrome)   . Melena 01/2008   anemia transfusion  . Menopause    per patient medical history form  . Osteoporosis   . Postmenopausal hormone therapy    per patient medical history form.  Marland Kitchen RLS (restless legs syndrome)   . Salmonella 05/2000    renal effects secondary to dehydration  . Seizure disorder ([place])   . Seizures ([place])    one seizure several yrs ago --etiology unknown-no problem since   Past Surgical History:  Procedure Laterality Date  . ABDOMINAL HYSTERECTOMY  1983  . BREAST SURGERY  02/2009   breast biopsy invasive ductal CA-bilateral mastectomies  . EPIGASTRIC HERNIA REPAIR  01/02/2012   Procedure: HERNIA REPAIR EPIGASTRIC ADULT;  Surgeon: Pedro Earls, MD;  Location: [facility];  Service: General;  Laterality: N/A;  Laparoscopic Repair Paraesophageal Hernia, Nissen  . EYE SURGERY Left 07/2017   Dr. Zadie Rhine  . LAPAROSCOPIC NISSEN FUNDOPLICATION  [id number]   Procedure: LAPAROSCOPIC NISSEN FUNDOPLICATION;  Surgeon: Pedro Earls, MD;  Location: [facility];  Service: General;  Laterality: N/A;  . MASTECTOMY  04/2009   Bilateral for ductal carcinoma on L  . OVARY SURGERY  1986   ovarian tumor removed    FAMILY HISTORY Family History  Problem Relation Age of Onset  . Heart attack Father   . Diabetes Father   . Esophageal cancer Father   . Breast cancer Sister        x 2 sisters  . Diabetes Sister   . Hypertension Brother   . Diabetes Brother   . Hypertension Mother   . Stroke Mother   . Breast cancer Mother   . Hypertension Sister     SOCIAL HISTORY Social History   Tobacco Use  . Smoking status: Never Smoker  . Smokeless tobacco: Never Used  Substance Use Topics  . Alcohol use: No    Alcohol/week: 0.0 standard drinks  . Drug use: No         OPHTHALMIC EXAM: Base Eye Exam    Visual Acuity (Snellen - Linear)      Right Left   Dist [place] 20/25 -1 20/40   Dist ph [address]  NI       Tonometry (Tonopen, 8:32 AM)      Right Left   Pressure 10 10       Pupils      Pupils Dark Light Shape React APD   Right PERRL 4 3 Round Brisk None   Left PERRL 4 3 Round Brisk None       Visual Fields (Counting fingers)      Left Right    Full Full       Neuro/Psych    Oriented x3: Yes   Mood/Affect:  Normal       Dilation    Left eye: 1.0% Mydriacyl, 2.5% Phenylephrine @ 8:32 AM        Slit Lamp and  Fundus Exam    External Exam      Right Left   External Normal Normal       Slit Lamp Exam      Right Left   Lids/Lashes  Normal   Lens Posterior chamber intraocular lens Posterior chamber intraocular lens       Fundus Exam      Right Left   Posterior Vitreous  Central vitreous floaters   Disc  Normal   C/D Ratio  0.55   Macula  Macular thickening, Focal laser scars   Vessels  Macular branch retinal vein occlusion superotemporal.   Periphery  Normal          IMAGING AND PROCEDURES  Imaging and Procedures for 07/14/19  OCT, Retina - OU - Both Eyes       Right Eye Quality was good. Scan locations included subfoveal. Progression has been stable. Findings include normal observations.   Left Eye Quality was good. Scan locations included subfoveal. Central Foveal Thickness: 280. Progression has worsened. Findings include cystoid macular edema.   Notes CME superior to the fovea OS has worsened at 7-week interval from macular branch retinal vein occlusion, repeat Avastin today and evaluate exam in 6 weeks       Intravitreal Injection, Pharmacologic Agent - OS - Left Eye       Time Out 07/14/2019. 9:05 AM. Confirmed correct patient, procedure, site, and patient consented.   Anesthesia Topical anesthesia was used. Anesthetic medications included Akten 3.5%.   Procedure Preparation included Ofloxacin , Tobramycin 0.3%, 10% betadine to eyelids. A 30 gauge needle was used.   Injection:  1.25 mg Bevacizumab (AVASTIN) SOLN   NDC: [id number]   Route: Intravitreal, Site: Left Eye, Waste: 0 mg  Post-op Post injection exam found visual acuity of at least counting fingers. The patient tolerated the procedure well. There were no complications. The patient received written and verbal post procedure care education. Post injection medications were not given.                  ASSESSMENT/PLAN:  No problem-specific Assessment & Plan notes found for this encounter.      ICD-10-CM   1. Branch retinal vein occlusion with macular edema of left eye  H34.8320 OCT, Retina - OU - Both Eyes    Intravitreal Injection, Pharmacologic Agent - OS - Left Eye    Bevacizumab (AVASTIN) SOLN 1.25 mg  2. Cystoid macular edema of left eye  H35.352 OCT, Retina - OU - Both Eyes  3. Hypertensive retinopathy of left eye  H35.032     1.  Return visit 6 weeks evaluate left eye consider Avastin  2.  3.  Ophthalmic Meds Ordered this visit:  Meds ordered this encounter  Medications  . Bevacizumab (AVASTIN) SOLN 1.25 mg       No follow-ups on file.  There are no Patient Instructions on file for this visit.   Explained the diagnoses, plan, and follow up with the patient and they expressed understanding.  Patient expressed understanding of the importance of proper follow up care.   Clent Demark Jackalyn Haith M.D. Diseases & Surgery of the Retina and Vitreous Retina & Diabetic [place] 07/14/19     Abbreviations: M myopia (nearsighted); A astigmatism; H hyperopia (farsighted); P presbyopia; Mrx spectacle prescription;  CTL contact lenses; OD right eye; OS left eye; OU both eyes  XT exotropia; ET esotropia; PEK punctate epithelial keratitis; PEE punctate epithelial erosions; DES dry eye syndrome; MGD meibomian gland dysfunction;  ATs artificial tears; PFAT's preservative free artificial tears; [place] nuclear sclerotic cataract; PSC posterior subcapsular cataract; ERM epi-retinal membrane; PVD posterior vitreous detachment; RD retinal detachment; DM diabetes mellitus; DR diabetic retinopathy; NPDR non-proliferative diabetic retinopathy; PDR proliferative diabetic retinopathy; CSME clinically significant macular edema; DME diabetic macular edema; dbh dot blot hemorrhages; CWS cotton wool spot; POAG primary open angle glaucoma; C/D cup-to-disc ratio; HVF humphrey visual field; GVF  goldmann visual field; OCT optical coherence tomography; IOP intraocular pressure; BRVO Branch retinal vein occlusion; CRVO central retinal vein occlusion; CRAO central retinal artery occlusion; BRAO branch retinal artery occlusion; RT retinal tear; SB scleral buckle; PPV pars plana vitrectomy; VH Vitreous hemorrhage; PRP panretinal laser photocoagulation; IVK intravitreal kenalog; VMT vitreomacular traction; MH Macular hole;  NVD neovascularization of the disc; NVE neovascularization elsewhere; AREDS age related eye disease study; ARMD age related macular degeneration; POAG primary open angle glaucoma; EBMD epithelial/anterior basement membrane dystrophy; ACIOL anterior chamber intraocular lens; IOL intraocular lens; PCIOL posterior chamber intraocular lens; Phaco/IOL phacoemulsification with intraocular lens placement; [place] photorefractive keratectomy; LASIK laser assisted in situ keratomileusis; HTN hypertension; DM diabetes mellitus; COPD chronic obstructive pulmonary disease

## 2019-08-05 ENCOUNTER — Ambulatory Visit (INDEPENDENT_AMBULATORY_CARE_PROVIDER_SITE_OTHER): Payer: Medicare Other | Admitting: Internal Medicine

## 2019-08-05 ENCOUNTER — Other Ambulatory Visit: Payer: Self-pay

## 2019-08-05 ENCOUNTER — Encounter: Payer: Self-pay | Admitting: Internal Medicine

## 2019-08-05 VITALS — BP 140/82 | HR 88 | Ht 63.0 in | Wt 153.0 lb

## 2019-08-05 DIAGNOSIS — E785 Hyperlipidemia, unspecified: Secondary | ICD-10-CM | POA: Diagnosis not present

## 2019-08-05 DIAGNOSIS — E1140 Type 2 diabetes mellitus with diabetic neuropathy, unspecified: Secondary | ICD-10-CM

## 2019-08-05 DIAGNOSIS — E1142 Type 2 diabetes mellitus with diabetic polyneuropathy: Secondary | ICD-10-CM | POA: Diagnosis not present

## 2019-08-05 DIAGNOSIS — E1169 Type 2 diabetes mellitus with other specified complication: Secondary | ICD-10-CM | POA: Diagnosis not present

## 2019-08-05 DIAGNOSIS — E1165 Type 2 diabetes mellitus with hyperglycemia: Secondary | ICD-10-CM | POA: Diagnosis not present

## 2019-08-05 LAB — POCT GLYCOSYLATED HEMOGLOBIN (HGB A1C): Hemoglobin A1C: 8.2 % — AB (ref 4.0–5.6)

## 2019-08-05 MED ORDER — TRULICITY 0.75 MG/0.5ML ~~LOC~~ SOAJ: SUBCUTANEOUS | 1 refills | Status: DC

## 2019-08-05 NOTE — Addendum Note (Signed)
Addended by: Cardell Peach I on: 08/05/2019 10:56 AM   Modules accepted: Orders

## 2019-08-05 NOTE — Patient Instructions (Addendum)
Please continue: - Jardiance 20/25 mg before b'fast  Please start Trulicity [id number] mg weekly. Let me know when you are close to running out to call in the higher dose to your pharmacy (1.5 mg).  Stop Januvia 1 week after you start Trulicity.  Can try the following for neuropathy: - alpha-lipoic acid 600 mg 2x a day  Please return in 3 months with your sugar log.
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
## 2019-08-28 ENCOUNTER — Encounter (INDEPENDENT_AMBULATORY_CARE_PROVIDER_SITE_OTHER): Payer: Self-pay | Admitting: Ophthalmology

## 2019-08-28 ENCOUNTER — Ambulatory Visit (INDEPENDENT_AMBULATORY_CARE_PROVIDER_SITE_OTHER): Payer: Medicare Other | Admitting: Ophthalmology

## 2019-08-28 ENCOUNTER — Other Ambulatory Visit: Payer: Self-pay

## 2019-08-28 DIAGNOSIS — H34832 Tributary (branch) retinal vein occlusion, left eye, with macular edema: Secondary | ICD-10-CM | POA: Diagnosis not present

## 2019-08-28 MED ORDER — BEVACIZUMAB CHEMO INJECTION 1.25MG/0.05ML SYRINGE FOR KALEIDOSCOPE
1.2500 mg | INTRAVITREAL | Status: AC | PRN
  Administered 2019-08-28: 1.25 mg via INTRAVITREAL

## 2019-08-28 NOTE — Assessment & Plan Note (Signed)
Severe CME OS secondary to macular branch retinal vein occlusion.  Worse at 7-week interval.  Repeat intravitreal Avastin OS today and examination in 5 weeks

## 2019-08-28 NOTE — Progress Notes (Signed)
08/28/2019     CHIEF COMPLAINT Patient presents for Retina Follow Up   HISTORY OF PRESENT ILLNESS: Valerie Ball is a 74 y.o. female who presents to the clinic today for:   HPI    Retina Follow Up    Patient presents with  CRVO/BRVO.  In left eye.  Severity is moderate.  Duration of 6.5 weeks.  Since onset it is stable.  I, the attending physician,  performed the HPI with the patient and updated documentation appropriately.          Comments    6.5 Week BRVO f\u OS. Possible Avastin OS. OCT  Pt state no changes in vision. Denies any complaints BGL: 172 A1C: 8.2       Last edited by Tilda Franco on 08/28/2019  8:09 AM. (History)      Referring physician: Tower, Wynelle Fanny, MD [place],  [place] [address]  HISTORICAL INFORMATION:   Selected notes from the MEDICAL RECORD NUMBER    Lab Results  Component Value Date   HGBA1C 8.2 (A) 08/05/2019     CURRENT MEDICATIONS: No current outpatient medications on file. (Ophthalmic Drugs)   No current facility-administered medications for this visit. (Ophthalmic Drugs)   Current Outpatient Medications (Other)  Medication Sig   Alpha-Lipoic Acid 600 MG TABS Take 600 mg by mouth 2 (two) times daily.   amLODipine (NORVASC) 5 MG tablet TAKE 1 TABLET DAILY   aspirin EC 81 MG tablet Take 81 mg by mouth daily.   Dulaglutide (TRULICITY) 5.05 LZ/7.6BH SOPN Inject 0.75 mg in am weekly under skin   empagliflozin (JARDIANCE) 10 MG TABS tablet Take 10 mg by mouth daily before breakfast.   ezetimibe (ZETIA) 10 MG tablet TAKE 1 TABLET DAILY   fluticasone (FLONASE) 50 MCG/ACT nasal spray Place 2 sprays into both nostrils daily as needed. Allergies   hydrochlorothiazide (HYDRODIURIL) 25 MG tablet TAKE 1 TABLET DAILY   JANUVIA 100 MG tablet TAKE 1 TABLET DAILY   levothyroxine (SYNTHROID) 50 MCG tablet TAKE 1 TABLET DAILY BEFORE BREAKFAST   losartan (COZAAR) 100 MG tablet Take 1 tablet (100 mg total) by  mouth daily.   Multiple Vitamin (MULTIVITAMIN WITH MINERALS) TABS tablet Take 1 tablet by mouth daily.   omeprazole (PRILOSEC) 20 MG capsule TAKE 1 CAPSULE TWICE A DAY BEFORE MEALS   rOPINIRole (REQUIP) 1 MG tablet TAKE 1 TABLET AT BEDTIME   rosuvastatin (CRESTOR) 10 MG tablet Take one pill by mouth twice weekly   sertraline (ZOLOFT) 100 MG tablet TAKE 1 TABLET AT BEDTIME   No current facility-administered medications for this visit. (Other)      REVIEW OF SYSTEMS: ROS    Positive for: Endocrine   Last edited by Tilda Franco on 08/28/2019  8:09 AM. (History)       ALLERGIES Allergies  Allergen Reactions   Arimidex [Anastrozole]     Joint pain   Glipizide Other (See Comments)    Hypoglycemia    Metformin And Related     diarrhea   Penicillins Rash    PAST MEDICAL HISTORY Past Medical History:  Diagnosis Date   Allergy    allegic rhinitis   Anemia    Angiodysplasia of colon    Anxiety    Arthritis    Blood transfusion    Borderline diabetic    per patient medical history form   Breast cancer ([place]) 02/2009   breast CA L invasive ductal CA(hormone receptor positive.)  Cataract    Colon polyp    hyperplastic   Colon stricture ([place]) 01/14/2003   Diabetes mellitus    pt's medical doctor took her off diabetic meds-blood sugars have not been a problem--and medications were causing pt's sugars to be too low   Diverticulosis    Esophageal stricture 02/04/2008   GERD (gastroesophageal reflux disease)    GI bleed    Hepatic steatosis    Hiatal hernia    Hyperlipidemia    Hypertension    Hypothyroidism    IBS (irritable bowel syndrome)    Melena 01/2008   anemia transfusion   Menopause    per patient medical history form   Osteoporosis    Postmenopausal hormone therapy    per patient medical history form.   RLS (restless legs syndrome)    Salmonella 05/2000   renal effects secondary to dehydration   Seizure  disorder ([place])    Seizures ([place])    one seizure several yrs ago --etiology unknown-no problem since   Past Surgical History:  Procedure Laterality Date   ABDOMINAL HYSTERECTOMY  1983   BREAST SURGERY  02/2009   breast biopsy invasive ductal CA-bilateral mastectomies   EPIGASTRIC HERNIA REPAIR  01/02/2012   Procedure: HERNIA REPAIR EPIGASTRIC ADULT;  Surgeon: Pedro Earls, MD;  Location: [facility];  Service: General;  Laterality: N/A;  Laparoscopic Repair Paraesophageal Hernia, Nissen   EYE SURGERY Left 07/2017   Dr. Zadie Rhine   LAPAROSCOPIC NISSEN FUNDOPLICATION  67/02/4579   Procedure: LAPAROSCOPIC NISSEN FUNDOPLICATION;  Surgeon: Pedro Earls, MD;  Location: [facility];  Service: General;  Laterality: N/A;   MASTECTOMY  04/2009   Bilateral for ductal carcinoma on L   OVARY SURGERY  1986   ovarian tumor removed    FAMILY HISTORY Family History  Problem Relation Age of Onset   Heart attack Father    Diabetes Father    Esophageal cancer Father    Breast cancer Sister        x 2 sisters   Diabetes Sister    Hypertension Brother    Diabetes Brother    Hypertension Mother    Stroke Mother    Breast cancer Mother    Hypertension Sister     SOCIAL HISTORY Social History   Tobacco Use   Smoking status: Never Smoker   Smokeless tobacco: Never Used  Scientific laboratory technician Use: Never used  Substance Use Topics   Alcohol use: No    Alcohol/week: 0.0 standard drinks   Drug use: No         OPHTHALMIC EXAM:  Base Eye Exam    Visual Acuity (Snellen - Linear)      Right Left   Dist [place] 20/25 + 20/50   Dist ph [place]  NI       Tonometry (Tonopen, 8:14 AM)      Right Left   Pressure 13 14       Pupils      Pupils Dark Light Shape React APD   Right PERRL 4 3 Round Brisk None   Left PERRL 4 3 Round Brisk None       Visual Fields (Counting fingers)      Left Right    Full Full       Neuro/Psych    Oriented x3: Yes   Mood/Affect: Normal         Dilation    Left eye: 1.0% Mydriacyl, 2.5% Phenylephrine @ 8:14 AM  Slit Lamp and Fundus Exam    External Exam      Right Left   External Normal Normal       Slit Lamp Exam      Right Left   Lids/Lashes  Normal   Conjunctiva/Sclera  White and quiet   Cornea  Clear   Anterior Chamber  Deep and quiet   Iris  Round and reactive   Lens Posterior chamber intraocular lens Posterior chamber intraocular lens       Fundus Exam      Right Left   Posterior Vitreous  Central vitreous floaters   Disc  Normal, collaterals on nerve   C/D Ratio  0.55   Macula  Macular thickening, Focal laser scars   Vessels  Macular branch retinal vein occlusion superotemporal.   Periphery  Normal          IMAGING AND PROCEDURES  Imaging and Procedures for 08/28/19  OCT, Retina - OU - Both Eyes       Right Eye Quality was good. Scan locations included subfoveal. Central Foveal Thickness: 248. Progression has been stable. Findings include normal observations.   Left Eye Quality was good. Scan locations included subfoveal. Central Foveal Thickness: 314. Progression has worsened. Findings include cystoid macular edema.   Notes Severe CME OS secondary to macular branch retinal vein occlusion.  Worse at 7-week interval.  Repeat intravitreal Avastin OS today and examination in 5 weeks       Intravitreal Injection, Pharmacologic Agent - OS - Left Eye       Time Out 08/28/2019. 9:12 AM. Confirmed correct patient, procedure, site, and patient consented.   Anesthesia Topical anesthesia was used. Anesthetic medications included Akten 3.5%.   Procedure Preparation included Ofloxacin , 10% betadine to eyelids. A 30 gauge needle was used.   Injection:  1.25 mg Bevacizumab (AVASTIN) SOLN   NDC: [address]-9, Lot: [id number]   Route: Intravitreal, Site: Left Eye, Waste: 0 mg  Post-op Post injection exam found visual acuity of at least counting fingers. The patient tolerated the procedure  well. There were no complications. The patient received written and verbal post procedure care education. Post injection medications were not given.                 ASSESSMENT/PLAN:  Branch retinal vein occlusion with macular edema of left eye Severe CME OS secondary to macular branch retinal vein occlusion.  Worse at 7-week interval.  Repeat intravitreal Avastin OS today and examination in 5 weeks      ICD-10-CM   1. Branch retinal vein occlusion with macular edema of left eye  H34.8320 OCT, Retina - OU - Both Eyes    Intravitreal Injection, Pharmacologic Agent - OS - Left Eye    Bevacizumab (AVASTIN) SOLN 1.25 mg    1.Severe CME OS secondary to macular branch retinal vein occlusion.  Worse at 7-week interval.  Repeat intravitreal Avastin OS today and examination in 5 weeks  2.  3.  Ophthalmic Meds Ordered this visit:  Meds ordered this encounter  Medications   Bevacizumab (AVASTIN) SOLN 1.25 mg       No follow-ups on file.  There are no Patient Instructions on file for this visit.   Explained the diagnoses, plan, and follow up with the patient and they expressed understanding.  Patient expressed understanding of the importance of proper follow up care.   Clent Demark Eleftherios Dudenhoeffer M.D. Diseases & Surgery of the Retina and Vitreous Retina & Diabetic [place] 08/28/19  Abbreviations: M myopia (nearsighted); A astigmatism; H hyperopia (farsighted); P presbyopia; Mrx spectacle prescription;  CTL contact lenses; OD right eye; OS left eye; OU both eyes  XT exotropia; ET esotropia; PEK punctate epithelial keratitis; PEE punctate epithelial erosions; DES dry eye syndrome; MGD meibomian gland dysfunction; ATs artificial tears; PFAT's preservative free artificial tears; [place] nuclear sclerotic cataract; PSC posterior subcapsular cataract; ERM epi-retinal membrane; PVD posterior vitreous detachment; RD retinal detachment; DM diabetes mellitus; DR diabetic retinopathy; NPDR  non-proliferative diabetic retinopathy; PDR proliferative diabetic retinopathy; CSME clinically significant macular edema; DME diabetic macular edema; dbh dot blot hemorrhages; CWS cotton wool spot; POAG primary open angle glaucoma; C/D cup-to-disc ratio; HVF humphrey visual field; GVF goldmann visual field; OCT optical coherence tomography; IOP intraocular pressure; BRVO Branch retinal vein occlusion; CRVO central retinal vein occlusion; CRAO central retinal artery occlusion; BRAO branch retinal artery occlusion; RT retinal tear; SB scleral buckle; PPV pars plana vitrectomy; VH Vitreous hemorrhage; PRP panretinal laser photocoagulation; IVK intravitreal kenalog; VMT vitreomacular traction; MH Macular hole;  NVD neovascularization of the disc; NVE neovascularization elsewhere; AREDS age related eye disease study; ARMD age related macular degeneration; POAG primary open angle glaucoma; EBMD epithelial/anterior basement membrane dystrophy; ACIOL anterior chamber intraocular lens; IOL intraocular lens; PCIOL posterior chamber intraocular lens; Phaco/IOL phacoemulsification with intraocular lens placement; [place] photorefractive keratectomy; LASIK laser assisted in situ keratomileusis; HTN hypertension; DM diabetes mellitus; COPD chronic obstructive pulmonary disease

## 2019-09-12 ENCOUNTER — Encounter: Payer: Self-pay | Admitting: Internal Medicine

## 2019-09-12 MED ORDER — EMPAGLIFLOZIN 10 MG PO TABS: 10.0000 mg | ORAL_TABLET | Freq: Every day | ORAL | 1 refills | Status: DC

## 2019-09-15 MED ORDER — EMPAGLIFLOZIN 25 MG PO TABS
25.0000 mg | ORAL_TABLET | Freq: Every day | ORAL | 1 refills | Status: DC
Start: 2019-09-15 — End: 2019-09-23

## 2019-09-15 NOTE — Addendum Note (Signed)
Addended by: Cardell Peach I on: 09/15/2019 07:24 AM   Modules accepted: Orders

## 2019-09-20 ENCOUNTER — Other Ambulatory Visit: Payer: Self-pay | Admitting: Family Medicine

## 2019-09-23 ENCOUNTER — Other Ambulatory Visit: Payer: Self-pay

## 2019-09-23 MED ORDER — TRULICITY 0.75 MG/0.5ML ~~LOC~~ SOAJ: SUBCUTANEOUS | 1 refills | Status: DC

## 2019-09-23 MED ORDER — EMPAGLIFLOZIN 25 MG PO TABS: 25.0000 mg | ORAL_TABLET | Freq: Every day | ORAL | 1 refills | Status: DC

## 2019-10-02 ENCOUNTER — Ambulatory Visit (INDEPENDENT_AMBULATORY_CARE_PROVIDER_SITE_OTHER): Payer: Medicare Other | Admitting: Ophthalmology

## 2019-10-02 ENCOUNTER — Other Ambulatory Visit: Payer: Self-pay

## 2019-10-02 ENCOUNTER — Encounter (INDEPENDENT_AMBULATORY_CARE_PROVIDER_SITE_OTHER): Payer: Self-pay | Admitting: Ophthalmology

## 2019-10-02 DIAGNOSIS — H34832 Tributary (branch) retinal vein occlusion, left eye, with macular edema: Secondary | ICD-10-CM

## 2019-10-02 MED ORDER — BEVACIZUMAB CHEMO INJECTION 1.25MG/0.05ML SYRINGE FOR KALEIDOSCOPE
1.2500 mg | INTRAVITREAL | Status: AC | PRN
  Administered 2019-10-02: 1.25 mg via INTRAVITREAL

## 2019-10-02 NOTE — Assessment & Plan Note (Signed)
OS with persistent CME and branch retinal vein occlusion.  Currently 5-week interval.  Possibly a waning effect on and benefit from intravitreal Avastin.  We will repeat injection today to maintain, and consider switch to intravitreal Eylea OS next visit

## 2019-10-02 NOTE — Progress Notes (Signed)
10/02/2019     CHIEF COMPLAINT Patient presents for Retina Follow Up   HISTORY OF PRESENT ILLNESS: Valerie Ball is a 74 y.o. female who presents to the clinic today for:   HPI    Retina Follow Up    Patient presents with  CRVO/BRVO.  In left eye.  This started 5 weeks ago.  Duration of 5 weeks.  Since onset it is stable.          Comments    5 week f/u possible Avastin OS, dilated exam and OCT(MAC) today.  Pt states her vision has gotten worse since last visit.        Last edited by Melburn Popper, COA on 10/02/2019  8:17 AM. (History)      Referring physician: Tower, Wynelle Fanny, MD [place],  [place] [address]  HISTORICAL INFORMATION:   Selected notes from the MEDICAL RECORD NUMBER    Lab Results  Component Value Date   HGBA1C 8.2 (A) 08/05/2019     CURRENT MEDICATIONS: No current outpatient medications on file. (Ophthalmic Drugs)   No current facility-administered medications for this visit. (Ophthalmic Drugs)   Current Outpatient Medications (Other)  Medication Sig  . Alpha-Lipoic Acid 600 MG TABS Take 600 mg by mouth 2 (two) times daily.  Marland Kitchen amLODipine (NORVASC) 5 MG tablet TAKE 1 TABLET DAILY  . aspirin EC 81 MG tablet Take 81 mg by mouth daily.  . Dulaglutide (TRULICITY) 0.62 IR/4.8NI SOPN Inject 0.75 mg in am weekly under skin  . empagliflozin (JARDIANCE) 25 MG TABS tablet Take 1 tablet (25 mg total) by mouth daily before breakfast.  . ezetimibe (ZETIA) 10 MG tablet TAKE 1 TABLET DAILY  . fluticasone (FLONASE) 50 MCG/ACT nasal spray Place 2 sprays into both nostrils daily as needed. Allergies  . hydrochlorothiazide (HYDRODIURIL) 25 MG tablet TAKE 1 TABLET DAILY  . levothyroxine (SYNTHROID) 50 MCG tablet TAKE 1 TABLET DAILY BEFORE BREAKFAST  . losartan (COZAAR) 100 MG tablet Take 1 tablet (100 mg total) by mouth daily.  . Multiple Vitamin (MULTIVITAMIN WITH MINERALS) TABS tablet Take 1 tablet by mouth daily.  Marland Kitchen omeprazole  (PRILOSEC) 20 MG capsule TAKE 1 CAPSULE TWICE A DAY BEFORE MEALS  . rOPINIRole (REQUIP) 1 MG tablet TAKE 1 TABLET AT BEDTIME  . rosuvastatin (CRESTOR) 10 MG tablet Take one pill by mouth twice weekly  . sertraline (ZOLOFT) 100 MG tablet TAKE 1 TABLET AT BEDTIME   No current facility-administered medications for this visit. (Other)      REVIEW OF SYSTEMS:    ALLERGIES Allergies  Allergen Reactions  . Arimidex [Anastrozole]     Joint pain  . Glipizide Other (See Comments)    Hypoglycemia   . Metformin And Related     diarrhea  . Penicillins Rash    PAST MEDICAL HISTORY Past Medical History:  Diagnosis Date  . Allergy    allegic rhinitis  . Anemia   . Angiodysplasia of colon   . Anxiety   . Arthritis   . Blood transfusion   . Borderline diabetic    per patient medical history form  . Breast cancer ([place]) 02/2009   breast CA L invasive ductal CA(hormone receptor positive.)  . Cataract   . Colon polyp    hyperplastic  . Colon stricture ([place]) 01/14/2003  . Diabetes mellitus    pt's medical doctor took her off diabetic meds-blood sugars have not been a problem--and medications were causing pt's sugars to be  too low  . Diverticulosis   . Esophageal stricture 02/04/2008  . GERD (gastroesophageal reflux disease)   . GI bleed   . Hepatic steatosis   . Hiatal hernia   . Hyperlipidemia   . Hypertension   . Hypothyroidism   . IBS (irritable bowel syndrome)   . Melena 01/2008   anemia transfusion  . Menopause    per patient medical history form  . Osteoporosis   . Postmenopausal hormone therapy    per patient medical history form.  Marland Kitchen RLS (restless legs syndrome)   . Salmonella 05/2000   renal effects secondary to dehydration  . Seizure disorder ([place])   . Seizures ([place])    one seizure several yrs ago --etiology unknown-no problem since   Past Surgical History:  Procedure Laterality Date  . ABDOMINAL HYSTERECTOMY  1983  . BREAST SURGERY  02/2009   breast  biopsy invasive ductal CA-bilateral mastectomies  . EPIGASTRIC HERNIA REPAIR  01/02/2012   Procedure: HERNIA REPAIR EPIGASTRIC ADULT;  Surgeon: Pedro Earls, MD;  Location: [facility];  Service: General;  Laterality: N/A;  Laparoscopic Repair Paraesophageal Hernia, Nissen  . EYE SURGERY Left 07/2017   Dr. Zadie Rhine  . LAPAROSCOPIC NISSEN FUNDOPLICATION  60/12/9321   Procedure: LAPAROSCOPIC NISSEN FUNDOPLICATION;  Surgeon: Pedro Earls, MD;  Location: [facility];  Service: General;  Laterality: N/A;  . MASTECTOMY  04/2009   Bilateral for ductal carcinoma on L  . OVARY SURGERY  1986   ovarian tumor removed    FAMILY HISTORY Family History  Problem Relation Age of Onset  . Heart attack Father   . Diabetes Father   . Esophageal cancer Father   . Breast cancer Sister        x 2 sisters  . Diabetes Sister   . Hypertension Brother   . Diabetes Brother   . Hypertension Mother   . Stroke Mother   . Breast cancer Mother   . Hypertension Sister     SOCIAL HISTORY Social History   Tobacco Use  . Smoking status: Never Smoker  . Smokeless tobacco: Never Used  Vaping Use  . Vaping Use: Never used  Substance Use Topics  . Alcohol use: No    Alcohol/week: 0.0 standard drinks  . Drug use: No         OPHTHALMIC EXAM:  Base Eye Exam    Visual Acuity (ETDRS)      Right Left   Dist [place] 20/40 +1 20/50 -2   Dist ph [place] 20/30 20/40       Tonometry (Tonopen, 8:21 AM)      Right Left   Pressure 14 14       Pupils      Pupils Dark Light Shape React APD   Right PERRL 4 3 Round Brisk None   Left PERRL 4 3 Round Brisk None       Visual Fields (Counting fingers)      Left Right    Full        Extraocular Movement      Right Left    Full Full       Neuro/Psych    Oriented x3: Yes   Mood/Affect: Normal       Dilation    Left eye: 1.0% Mydriacyl, 2.5% Phenylephrine @ 8:22 AM        Slit Lamp and Fundus Exam    External Exam      Right Left   External Normal Normal  Slit Lamp Exam      Right Left   Lids/Lashes Normal Normal   Conjunctiva/Sclera White and quiet White and quiet   Cornea Clear Clear   Anterior Chamber Deep and quiet Deep and quiet   Iris Round and reactive Round and reactive   Lens Posterior chamber intraocular lens Posterior chamber intraocular lens   Anterior Vitreous Normal Normal       Fundus Exam      Right Left   Posterior Vitreous  Central vitreous floaters   Disc  Normal, collaterals on nerve   C/D Ratio  0.55   Macula  Macular thickening, Good Focal laser, Microaneurysms, Clinically significant macular edema   Vessels  Macular branch retinal vein occlusion superotemporal.   Periphery  Normal          IMAGING AND PROCEDURES  Imaging and Procedures for 10/02/19  OCT, Retina - OU - Both Eyes       Right Eye Quality was good. Scan locations included subfoveal. Central Foveal Thickness: 247. Progression has been stable. Findings include retinal drusen , normal foveal contour.   Left Eye Quality was good. Scan locations included subfoveal. Central Foveal Thickness: 309. Progression has improved. Findings include abnormal foveal contour, cystoid macular edema.   Notes Persistent branch retinal vein occlusion, macula, superior portion of the macula left eye essentially not improving over time even at 5-week intervals on intravitreal Avastin.  Possible waning effect of medication, will switch medication next visit       Intravitreal Injection, Pharmacologic Agent - OS - Left Eye       Time Out 10/02/2019. 8:42 AM. Confirmed correct patient, procedure, site, and patient consented.   Anesthesia Topical anesthesia was used. Anesthetic medications included Akten 3.5%.   Procedure Preparation included 10% betadine to eyelids, 5% betadine to ocular surface, Ofloxacin . A 30 gauge needle was used.   Injection:  1.25 mg Bevacizumab (AVASTIN) SOLN   NDC: [address]-2, Lot: [id number]   Route: Intravitreal, Site:  Left Eye, Waste: 0 mg  Post-op Post injection exam found visual acuity of at least counting fingers. The patient tolerated the procedure well. There were no complications. The patient received written and verbal post procedure care education. Post injection medications were not given.                 ASSESSMENT/PLAN:  Branch retinal vein occlusion with macular edema of left eye OS with persistent CME and branch retinal vein occlusion.  Currently 5-week interval.  Possibly a waning effect on and benefit from intravitreal Avastin.  We will repeat injection today to maintain, and consider switch to intravitreal Eylea OS next visit      ICD-10-CM   1. Branch retinal vein occlusion with macular edema of left eye  H34.8320 OCT, Retina - OU - Both Eyes    Intravitreal Injection, Pharmacologic Agent - OS - Left Eye    Bevacizumab (AVASTIN) SOLN 1.25 mg    1.Persistent branch retinal vein occlusion, macula, superior portion of the macula left eye essentially not improving over time even at 5-week intervals on intravitreal Avastin.  Possible waning effect of medication, will switch medication next visit  2.  Vitreal Avastin OS today  3.  Next visit, if CME persists, will change to intravitreal Eylea OS  Ophthalmic Meds Ordered this visit:  Meds ordered this encounter  Medications  . Bevacizumab (AVASTIN) SOLN 1.25 mg       Return in about 5 weeks (around 11/06/2019) for DILATE OU, OCT,  EYLEA OCT, OS.  There are no Patient Instructions on file for this visit.   Explained the diagnoses, plan, and follow up with the patient and they expressed understanding.  Patient expressed understanding of the importance of proper follow up care.   Clent Demark Isacc Turney M.D. Diseases & Surgery of the Retina and Vitreous Retina & Diabetic [place] 10/02/19     Abbreviations: M myopia (nearsighted); A astigmatism; H hyperopia (farsighted); P presbyopia; Mrx spectacle prescription;  CTL contact  lenses; OD right eye; OS left eye; OU both eyes  XT exotropia; ET esotropia; PEK punctate epithelial keratitis; PEE punctate epithelial erosions; DES dry eye syndrome; MGD meibomian gland dysfunction; ATs artificial tears; PFAT's preservative free artificial tears; [place] nuclear sclerotic cataract; PSC posterior subcapsular cataract; ERM epi-retinal membrane; PVD posterior vitreous detachment; RD retinal detachment; DM diabetes mellitus; DR diabetic retinopathy; NPDR non-proliferative diabetic retinopathy; PDR proliferative diabetic retinopathy; CSME clinically significant macular edema; DME diabetic macular edema; dbh dot blot hemorrhages; CWS cotton wool spot; POAG primary open angle glaucoma; C/D cup-to-disc ratio; HVF humphrey visual field; GVF goldmann visual field; OCT optical coherence tomography; IOP intraocular pressure; BRVO Branch retinal vein occlusion; CRVO central retinal vein occlusion; CRAO central retinal artery occlusion; BRAO branch retinal artery occlusion; RT retinal tear; SB scleral buckle; PPV pars plana vitrectomy; VH Vitreous hemorrhage; PRP panretinal laser photocoagulation; IVK intravitreal kenalog; VMT vitreomacular traction; MH Macular hole;  NVD neovascularization of the disc; NVE neovascularization elsewhere; AREDS age related eye disease study; ARMD age related macular degeneration; POAG primary open angle glaucoma; EBMD epithelial/anterior basement membrane dystrophy; ACIOL anterior chamber intraocular lens; IOL intraocular lens; PCIOL posterior chamber intraocular lens; Phaco/IOL phacoemulsification with intraocular lens placement; [place] photorefractive keratectomy; LASIK laser assisted in situ keratomileusis; HTN hypertension; DM diabetes mellitus; COPD chronic obstructive pulmonary disease

## 2019-10-21 ENCOUNTER — Encounter: Payer: Self-pay | Admitting: Family Medicine

## 2019-10-31 ENCOUNTER — Other Ambulatory Visit: Payer: Self-pay | Admitting: Internal Medicine

## 2019-10-31 NOTE — Telephone Encounter (Signed)
My Chart message sent to pt to obtain clarification of request. Does she want a Rx for 0.75mg  or 1.5mg . Will need to await her response.

## 2019-11-06 ENCOUNTER — Ambulatory Visit (INDEPENDENT_AMBULATORY_CARE_PROVIDER_SITE_OTHER): Payer: Medicare Other | Admitting: Ophthalmology

## 2019-11-06 ENCOUNTER — Other Ambulatory Visit: Payer: Self-pay

## 2019-11-06 ENCOUNTER — Encounter (INDEPENDENT_AMBULATORY_CARE_PROVIDER_SITE_OTHER): Payer: Self-pay | Admitting: Ophthalmology

## 2019-11-06 DIAGNOSIS — H34832 Tributary (branch) retinal vein occlusion, left eye, with macular edema: Secondary | ICD-10-CM | POA: Diagnosis not present

## 2019-11-06 DIAGNOSIS — H43811 Vitreous degeneration, right eye: Secondary | ICD-10-CM

## 2019-11-06 HISTORY — DX: Vitreous degeneration, right eye: H43.811

## 2019-11-06 MED ORDER — AFLIBERCEPT 2MG/0.05ML IZ SOLN FOR KALEIDOSCOPE
2.0000 mg | INTRAVITREAL | Status: AC | PRN
  Administered 2019-11-06: 2 mg via INTRAVITREAL

## 2019-11-06 NOTE — Assessment & Plan Note (Signed)
CME superiorly  Improved at 1 month interval post intravitreal Eylea will repeat injection today and examination in 1 month  less center involved CME

## 2019-11-06 NOTE — Progress Notes (Signed)
11/06/2019     CHIEF COMPLAINT Patient presents for Retina Follow Up   HISTORY OF PRESENT ILLNESS: Valerie Ball is a 74 y.o. female who presents to the clinic today for:   HPI    Retina Follow Up    Patient presents with  CRVO/BRVO.  In left eye.  This started 5 weeks ago.  Severity is mild.  Duration of 5 weeks.  Since onset it is stable.          Comments    5 Week F/U OU, poss Eylea OS  Pt denies noticeable changes to [place] OU since last visit. Pt denies flashes of light or floaters OU. Pt c/o intermittent ache OU, and feels this may be related to her sinuses.        Last edited by Rockie Neighbours, [place] on 11/06/2019  8:22 AM. (History)      Referring physician: Tower, Wynelle Fanny, MD [place],  [place] [id number]  HISTORICAL INFORMATION:   Selected notes from the MEDICAL RECORD NUMBER    Lab Results  Component Value Date   HGBA1C 8.2 (A) 08/05/2019     CURRENT MEDICATIONS: No current outpatient medications on file. (Ophthalmic Drugs)   No current facility-administered medications for this visit. (Ophthalmic Drugs)   Current Outpatient Medications (Other)  Medication Sig  . Alpha-Lipoic Acid 600 MG TABS Take 600 mg by mouth 2 (two) times daily.  Marland Kitchen amLODipine (NORVASC) 5 MG tablet TAKE 1 TABLET DAILY  . aspirin EC 81 MG tablet Take 81 mg by mouth daily.  . Dulaglutide (TRULICITY) 4.65 KP/5.4SF SOPN Inject 0.75 mg in am weekly under skin  . empagliflozin (JARDIANCE) 25 MG TABS tablet Take 1 tablet (25 mg total) by mouth daily before breakfast.  . ezetimibe (ZETIA) 10 MG tablet TAKE 1 TABLET DAILY  . fluticasone (FLONASE) 50 MCG/ACT nasal spray Place 2 sprays into both nostrils daily as needed. Allergies  . hydrochlorothiazide (HYDRODIURIL) 25 MG tablet TAKE 1 TABLET DAILY  . levothyroxine (SYNTHROID) 50 MCG tablet TAKE 1 TABLET DAILY BEFORE BREAKFAST  . losartan (COZAAR) 100 MG tablet Take 1 tablet (100 mg total) by mouth daily.  . Multiple  Vitamin (MULTIVITAMIN WITH MINERALS) TABS tablet Take 1 tablet by mouth daily.  Marland Kitchen omeprazole (PRILOSEC) 20 MG capsule TAKE 1 CAPSULE TWICE A DAY BEFORE MEALS  . rOPINIRole (REQUIP) 1 MG tablet TAKE 1 TABLET AT BEDTIME  . rosuvastatin (CRESTOR) 10 MG tablet Take one pill by mouth twice weekly  . sertraline (ZOLOFT) 100 MG tablet TAKE 1 TABLET AT BEDTIME   No current facility-administered medications for this visit. (Other)      REVIEW OF SYSTEMS:    ALLERGIES Allergies  Allergen Reactions  . Arimidex [Anastrozole]     Joint pain  . Glipizide Other (See Comments)    Hypoglycemia   . Metformin And Related     diarrhea  . Penicillins Rash    PAST MEDICAL HISTORY Past Medical History:  Diagnosis Date  . Allergy    allegic rhinitis  . Anemia   . Angiodysplasia of colon   . Anxiety   . Arthritis   . Blood transfusion   . Borderline diabetic    per patient medical history form  . Breast cancer ([place]) 02/2009   breast CA L invasive ductal CA(hormone receptor positive.)  . Cataract   . Colon polyp    hyperplastic  . Colon stricture ([place]) 01/14/2003  . Diabetes mellitus  pt's medical doctor took her off diabetic meds-blood sugars have not been a problem--and medications were causing pt's sugars to be too low  . Diverticulosis   . Esophageal stricture 02/04/2008  . GERD (gastroesophageal reflux disease)   . GI bleed   . Hepatic steatosis   . Hiatal hernia   . Hyperlipidemia   . Hypertension   . Hypothyroidism   . IBS (irritable bowel syndrome)   . Melena 01/2008   anemia transfusion  . Menopause    per patient medical history form  . Osteoporosis   . Postmenopausal hormone therapy    per patient medical history form.  Marland Kitchen RLS (restless legs syndrome)   . Salmonella 05/2000   renal effects secondary to dehydration  . Seizure disorder ([place])   . Seizures ([place])    one seizure several yrs ago --etiology unknown-no problem since   Past Surgical History:    Procedure Laterality Date  . ABDOMINAL HYSTERECTOMY  1983  . BREAST SURGERY  02/2009   breast biopsy invasive ductal CA-bilateral mastectomies  . EPIGASTRIC HERNIA REPAIR  01/02/2012   Procedure: HERNIA REPAIR EPIGASTRIC ADULT;  Surgeon: Pedro Earls, MD;  Location: [facility];  Service: General;  Laterality: N/A;  Laparoscopic Repair Paraesophageal Hernia, [place]  . EYE SURGERY Left 07/2017   Dr. Zadie Rhine  . LAPAROSCOPIC NISSEN FUNDOPLICATION  09/10/7626   Procedure: LAPAROSCOPIC NISSEN FUNDOPLICATION;  Surgeon: Pedro Earls, MD;  Location: [facility];  Service: General;  Laterality: N/A;  . MASTECTOMY  04/2009   Bilateral for ductal carcinoma on L  . OVARY SURGERY  1986   ovarian tumor removed    FAMILY HISTORY Family History  Problem Relation Age of Onset  . Heart attack Father   . Diabetes Father   . Esophageal cancer Father   . Breast cancer Sister        x 2 sisters  . Diabetes Sister   . Hypertension Brother   . Diabetes Brother   . Hypertension Mother   . Stroke Mother   . Breast cancer Mother   . Hypertension Sister     SOCIAL HISTORY Social History   Tobacco Use  . Smoking status: Never Smoker  . Smokeless tobacco: Never Used  Vaping Use  . Vaping Use: Never used  Substance Use Topics  . Alcohol use: No    Alcohol/week: 0.0 standard drinks  . Drug use: No         OPHTHALMIC EXAM:  Base Eye Exam    Visual Acuity (ETDRS)      Right Left   Dist [place] 20/30 +1 20/60 +2   Dist ph [place] NI 20/50 -2       Tonometry (Tonopen, 8:23 AM)      Right Left   Pressure 16 17       Pupils      Pupils Dark Light Shape React APD   Right PERRL 4 3 Round Brisk None   Left PERRL 4 3 Round Brisk None       Visual Fields (Counting fingers)      Left Right    Full Full       Extraocular Movement      Right Left    Full Full       Neuro/Psych    Oriented x3: Yes   Mood/Affect: Normal       Dilation    Both eyes: 1.0% Mydriacyl, 2.5% Phenylephrine @ 8:25  AM  Slit Lamp and Fundus Exam    External Exam      Right Left   External Normal Normal       Slit Lamp Exam      Right Left   Lids/Lashes Normal Normal   Conjunctiva/Sclera White and quiet White and quiet   Cornea Clear Clear   Anterior Chamber Deep and quiet Deep and quiet   Iris Round and reactive Round and reactive   Lens Posterior chamber intraocular lens Posterior chamber intraocular lens   Anterior Vitreous Normal Normal       Fundus Exam      Right Left   Posterior Vitreous Posterior vitreous detachment Central vitreous floaters   Disc Normal Normal, collaterals on nerve   C/D Ratio 0.5 0.55   Macula Normal Macular thickening, Good Focal laser, Microaneurysms, Clinically significant macular edema   Vessels Normal Macular branch retinal vein occlusion superotemporal.   Periphery Normal Normal          IMAGING AND PROCEDURES  Imaging and Procedures for 11/06/19  OCT, Retina - OU - Both Eyes       Right Eye Quality was good. Scan locations included subfoveal. Central Foveal Thickness: 242. Progression has been stable. Findings include normal observations, normal foveal contour.   Left Eye Quality was good. Central Foveal Thickness: 251. Progression has improved. Findings include cystoid macular edema.   Notes OS with CME superior to the fovea, much less at 1 month follow-up post Eylea.  We will repeat injection OS today and examination again in 1 month       Intravitreal Injection, Pharmacologic Agent - OS - Left Eye       Time Out 11/06/2019. 9:14 AM. Confirmed correct patient, procedure, site, and patient consented.   Anesthesia Topical anesthesia was used. Anesthetic medications included [place] 3.5%.   Procedure Preparation included Ofloxacin , Tobramycin 0.3%, 10% betadine to eyelids, 5% betadine to ocular surface. A 30 gauge needle was used.   Injection:  2 mg aflibercept Alfonse Flavors) SOLN   NDC: [id number], Lot: [id number]   Route:  Intravitreal, Site: Left Eye, Waste: 0 mg  Post-op Post injection exam found visual acuity of at least counting fingers. The patient tolerated the procedure well. There were no complications. The patient received written and verbal post procedure care education. Post injection medications were not given.                 ASSESSMENT/PLAN:  Branch retinal vein occlusion with macular edema of left eye CME superiorly  Improved at 1 month interval post intravitreal Eylea will repeat injection today and examination in 1 month  less center involved CME  Posterior vitreous detachment of right eye   The nature of posterior vitreous detachment was discussed with the patient as well as its physiology, its age prevalence, and its possible implication regarding retinal breaks and detachment.  An informational brochure was given to the patient.  All the patient's questions were answered.  The patient was asked to return if new or different flashes or floaters develops.   Patient was instructed to contact office immediately if any changes were noticed. I explained to the patient that vitreous inside the eye is similar to jello inside a bowl. As the jello melts it can start to pull away from the bowl, similarly the vitreous throughout our lives can begin to pull away from the retina. That process is called a posterior vitreous detachment. In some cases, the vitreous can tug hard enough on the  retina to form a retinal tear. I discussed with the patient the signs and symptoms of a retinal detachment.  Do not rub the eye.      ICD-10-CM   1. Branch retinal vein occlusion with macular edema of left eye  H34.8320 OCT, Retina - OU - Both Eyes    Intravitreal Injection, Pharmacologic Agent - OS - Left Eye    aflibercept (EYLEA) SOLN 2 mg  2. Posterior vitreous detachment of right eye  H43.811     1.  CME OS has improved and less center involvement.  At 1 month follow-up post Eylea, will repeat injection  today and examination in 1 month possible repeat injection  2.  3.  Ophthalmic Meds Ordered this visit:  Meds ordered this encounter  Medications  . aflibercept (EYLEA) SOLN 2 mg       Return in about 1 month (around 12/07/2019) for dilate, OS, EYLEA OCT.  There are no Patient Instructions on file for this visit.   Explained the diagnoses, plan, and follow up with the patient and they expressed understanding.  Patient expressed understanding of the importance of proper follow up care.   Clent Demark Mahina Salatino M.D. Diseases & Surgery of the Retina and Vitreous [facility]ina [facility] Diabetic [place] 11/06/19     Abbreviations: M myopia (nearsighted); A astigmatism; H hyperopia (farsighted); P presbyopia; Mrx spectacle prescription;  CTL contact lenses; OD right eye; OS left eye; OU both eyes  XT exotropia; ET esotropia; PEK punctate epithelial keratitis; PEE punctate epithelial erosions; DES dry eye syndrome; MGD meibomian gland dysfunction; ATs artificial tears; PFAT's preservative free artificial tears; [place] nuclear sclerotic cataract; PSC posterior subcapsular cataract; ERM epi-retinal membrane; PVD posterior vitreous detachment; RD retinal detachment; DM diabetes mellitus; DR diabetic retinopathy; NPDR non-proliferative diabetic retinopathy; PDR proliferative diabetic retinopathy; CSME clinically significant macular edema; DME diabetic macular edema; dbh dot blot hemorrhages; CWS cotton wool spot; POAG primary open angle glaucoma; C/D cup-to-disc ratio; HVF humphrey visual field; GVF goldmann visual field; OCT optical coherence tomography; IOP intraocular pressure; BRVO Branch retinal vein occlusion; CRVO central retinal vein occlusion; CRAO central retinal artery occlusion; BRAO branch retinal artery occlusion; RT retinal tear; SB scleral buckle; PPV pars plana vitrectomy; VH Vitreous hemorrhage; PRP panretinal laser photocoagulation; IVK intravitreal kenalog; VMT vitreomacular traction; MH Macular  hole;  NVD neovascularization of the disc; NVE neovascularization elsewhere; AREDS age related eye disease study; ARMD age related macular degeneration; POAG primary open angle glaucoma; EBMD epithelial/anterior basement membrane dystrophy; ACIOL anterior chamber intraocular lens; IOL intraocular lens; PCIOL posterior chamber intraocular lens; Phaco/IOL phacoemulsification with intraocular lens placement; [place] photorefractive keratectomy; LASIK laser assisted in situ keratomileusis; HTN hypertension; DM diabetes mellitus; COPD chronic obstructive pulmonary disease

## 2019-11-06 NOTE — Assessment & Plan Note (Signed)

## 2019-11-25 ENCOUNTER — Other Ambulatory Visit: Payer: Self-pay | Admitting: Internal Medicine

## 2019-11-26 ENCOUNTER — Other Ambulatory Visit: Payer: Self-pay | Admitting: Internal Medicine

## 2019-11-26 MED ORDER — TRULICITY 3 MG/0.5ML ~~LOC~~ SOAJ: 3.0000 mg | SUBCUTANEOUS | 3 refills | Status: DC

## 2019-12-02 ENCOUNTER — Ambulatory Visit: Payer: Medicare Other | Admitting: Internal Medicine
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
## 2019-12-09 ENCOUNTER — Encounter (INDEPENDENT_AMBULATORY_CARE_PROVIDER_SITE_OTHER): Payer: Medicare Other | Admitting: Ophthalmology

## 2019-12-10 ENCOUNTER — Encounter (INDEPENDENT_AMBULATORY_CARE_PROVIDER_SITE_OTHER): Payer: Self-pay | Admitting: Ophthalmology

## 2019-12-10 ENCOUNTER — Other Ambulatory Visit: Payer: Self-pay

## 2019-12-10 ENCOUNTER — Ambulatory Visit (INDEPENDENT_AMBULATORY_CARE_PROVIDER_SITE_OTHER): Payer: Medicare Other | Admitting: Ophthalmology

## 2019-12-10 DIAGNOSIS — H34832 Tributary (branch) retinal vein occlusion, left eye, with macular edema: Secondary | ICD-10-CM | POA: Diagnosis not present

## 2019-12-10 MED ORDER — AFLIBERCEPT 2MG/0.05ML IZ SOLN FOR KALEIDOSCOPE
2.0000 mg | INTRAVITREAL | Status: AC | PRN
  Administered 2019-12-10: 2 mg via INTRAVITREAL

## 2019-12-10 NOTE — Progress Notes (Signed)
12/10/2019     CHIEF COMPLAINT Patient presents for Retina Follow Up   HISTORY OF PRESENT ILLNESS: Valerie Ball is a 74 y.o. female who presents to the clinic today for:   HPI    Retina Follow Up    Patient presents with  CRVO/BRVO.  In left eye.  Severity is moderate.  Duration of 1 month.  Since onset it is stable.  I, the attending physician,  performed the HPI with the patient and updated documentation appropriately.          Comments    1 Month BRVO f\u OS. Possible Eylea OS. OCT  Pt feels vision has declined. Denies any other complaints. BGL: 156       Last edited by Tilda Franco on 12/10/2019 11:04 AM. (History)      Referring physician: Tower, Wynelle Fanny, MD [place],  [place] [id number]  HISTORICAL INFORMATION:   Selected notes from the MEDICAL RECORD NUMBER    Lab Results  Component Value Date   HGBA1C 8.2 (A) 08/05/2019     CURRENT MEDICATIONS: No current outpatient medications on file. (Ophthalmic Drugs)   No current facility-administered medications for this visit. (Ophthalmic Drugs)   Current Outpatient Medications (Other)  Medication Sig  . Alpha-Lipoic Acid 600 MG TABS Take 600 mg by mouth 2 (two) times daily.  Marland Kitchen amLODipine (NORVASC) 5 MG tablet TAKE 1 TABLET DAILY  . aspirin EC 81 MG tablet Take 81 mg by mouth daily.  . Dulaglutide (TRULICITY) 3 GL/8.7FI SOPN Inject 0.5 mLs (3 mg total) into the skin once a week.  . empagliflozin (JARDIANCE) 25 MG TABS tablet Take 1 tablet (25 mg total) by mouth daily before breakfast.  . ezetimibe (ZETIA) 10 MG tablet TAKE 1 TABLET DAILY  . fluticasone (FLONASE) 50 MCG/ACT nasal spray Place 2 sprays into both nostrils daily as needed. Allergies  . hydrochlorothiazide (HYDRODIURIL) 25 MG tablet TAKE 1 TABLET DAILY  . levothyroxine (SYNTHROID) 50 MCG tablet TAKE 1 TABLET DAILY BEFORE BREAKFAST  . losartan (COZAAR) 100 MG tablet Take 1 tablet (100 mg total) by mouth daily.  . Multiple  Vitamin (MULTIVITAMIN WITH MINERALS) TABS tablet Take 1 tablet by mouth daily.  Marland Kitchen omeprazole (PRILOSEC) 20 MG capsule TAKE 1 CAPSULE TWICE A DAY BEFORE MEALS  . rOPINIRole (REQUIP) 1 MG tablet TAKE 1 TABLET AT BEDTIME  . rosuvastatin (CRESTOR) 10 MG tablet Take one pill by mouth twice weekly  . sertraline (ZOLOFT) 100 MG tablet TAKE 1 TABLET AT BEDTIME   No current facility-administered medications for this visit. (Other)      REVIEW OF SYSTEMS:    ALLERGIES Allergies  Allergen Reactions  . Arimidex [Anastrozole]     Joint pain  . Glipizide Other (See Comments)    Hypoglycemia   . Metformin And Related     diarrhea  . Penicillins Rash    PAST MEDICAL HISTORY Past Medical History:  Diagnosis Date  . Allergy    allegic rhinitis  . Anemia   . Angiodysplasia of colon   . Anxiety   . Arthritis   . Blood transfusion   . Borderline diabetic    per patient medical history form  . Breast cancer ([place]) 02/2009   breast CA L invasive ductal CA(hormone receptor positive.)  . Cataract   . Colon polyp    hyperplastic  . Colon stricture ([place]) 01/14/2003  . Diabetes mellitus    pt's medical doctor took her off diabetic  meds-blood sugars have not been a problem--and medications were causing pt's sugars to be too low  . Diverticulosis   . Esophageal stricture 02/04/2008  . GERD (gastroesophageal reflux disease)   . GI bleed   . Hepatic steatosis   . Hiatal hernia   . Hyperlipidemia   . Hypertension   . Hypothyroidism   . IBS (irritable bowel syndrome)   . Melena 01/2008   anemia transfusion  . Menopause    per patient medical history form  . Osteoporosis   . Postmenopausal hormone therapy    per patient medical history form.  Marland Kitchen RLS (restless legs syndrome)   . Salmonella 05/2000   renal effects secondary to dehydration  . Seizure disorder ([place])   . Seizures ([place])    one seizure several yrs ago --etiology unknown-no problem since   Past Surgical History:    Procedure Laterality Date  . ABDOMINAL HYSTERECTOMY  1983  . BREAST SURGERY  02/2009   breast biopsy invasive ductal CA-bilateral mastectomies  . EPIGASTRIC HERNIA REPAIR  01/02/2012   Procedure: HERNIA REPAIR EPIGASTRIC ADULT;  Surgeon: Pedro Earls, MD;  Location: [facility];  Service: General;  Laterality: N/A;  Laparoscopic Repair Paraesophageal Hernia, Nissen  . EYE SURGERY Left 07/2017   Dr. Zadie Rhine  . LAPAROSCOPIC NISSEN FUNDOPLICATION  76/54/6503   Procedure: LAPAROSCOPIC NISSEN FUNDOPLICATION;  Surgeon: Pedro Earls, MD;  Location: [facility];  Service: General;  Laterality: N/A;  . MASTECTOMY  04/2009   Bilateral for ductal carcinoma on L  . OVARY SURGERY  1986   ovarian tumor removed    FAMILY HISTORY Family History  Problem Relation Age of Onset  . Heart attack Father   . Diabetes Father   . Esophageal cancer Father   . Breast cancer Sister        x 2 sisters  . Diabetes Sister   . Hypertension Brother   . Diabetes Brother   . Hypertension Mother   . Stroke Mother   . Breast cancer Mother   . Hypertension Sister     SOCIAL HISTORY Social History   Tobacco Use  . Smoking status: Never Smoker  . Smokeless tobacco: Never Used  Vaping Use  . Vaping Use: Never used  Substance Use Topics  . Alcohol use: No    Alcohol/week: 0.0 standard drinks  . Drug use: No         OPHTHALMIC EXAM: Base Eye Exam    Visual Acuity (Snellen - Linear)      Right Left   Dist [place] 20/25 20/50   Dist ph [place]  NI       Tonometry (Tonopen, 11:08 AM)      Right Left   Pressure 9 8       Pupils      Pupils Dark Light Shape React APD   Right PERRL 4 3 Round Brisk None   Left PERRL 4 3 Round Brisk None       Visual Fields (Counting fingers)      Left Right    Full Full       Neuro/Psych    Oriented x3: Yes   Mood/Affect: Normal       Dilation    Left eye: 1.0% Mydriacyl, 2.5% Phenylephrine @ 11:08 AM        Slit Lamp and Fundus Exam    External Exam       Right Left   External Normal Normal       Slit  Lamp Exam      Right Left   Lids/Lashes Normal Normal   Conjunctiva/Sclera White and quiet White and quiet   Cornea Clear Clear   Anterior Chamber Deep and quiet Deep and quiet   Iris Round and reactive Round and reactive   Lens Posterior chamber intraocular lens Posterior chamber intraocular lens   Anterior Vitreous Normal Normal       Fundus Exam      Right Left   Posterior Vitreous  Central vitreous floaters   Disc  Normal, collaterals on nerve   C/D Ratio  0.55   Macula  Macular thickening, Good Focal laser, Microaneurysms, Clinically significant macular edema   Vessels  Macular branch retinal vein occlusion superotemporal.   Periphery  Normal          IMAGING AND PROCEDURES  Imaging and Procedures for 12/10/19  OCT, Retina - OU - Both Eyes       Right Eye Quality was good. Scan locations included subfoveal. Central Foveal Thickness: 241. Progression has been stable. Findings include normal observations, normal foveal contour.   Left Eye Quality was good. Scan locations included subfoveal. Central Foveal Thickness: 256. Progression has improved. Findings include cystoid macular edema.   Notes OS with CME superior to the fovea, much less at 5 weeks follow-up post Eylea.  We will repeat injection OS today and examination again in 6 weeks       Intravitreal Injection, Pharmacologic Agent - OS - Left Eye       Time Out 12/10/2019. 11:36 AM. Confirmed correct patient, procedure, site, and patient consented.   Anesthesia Topical anesthesia was used. Anesthetic medications included Akten 3.5%.   Procedure Preparation included Ofloxacin , 10% betadine to eyelids, 5% betadine to ocular surface, Tobramycin 0.3%. A 30 gauge needle was used.   Injection:  2 mg aflibercept Alfonse Flavors) SOLN   NDC: [id number], Lot: [id number]   Route: Intravitreal, Site: Left Eye, Waste: 0 mg  Post-op Post injection exam found visual  acuity of at least counting fingers. The patient tolerated the procedure well. There were no complications. The patient received written and verbal post procedure care education. Post injection medications were not given.                 ASSESSMENT/PLAN:  Branch retinal vein occlusion with macular edema of left eye OS, CME continues to do well currently at 5 weeks.  Has worsened in 7 weeks in the past on Avastin, will nonetheless repeat examination left eye in 6 weeks after intravitreal Eylea today      ICD-10-CM   1. Branch retinal vein occlusion with macular edema of left eye  H34.8320 OCT, Retina - OU - Both Eyes    Intravitreal Injection, Pharmacologic Agent - OS - Left Eye    aflibercept (EYLEA) SOLN 2 mg    1.  Asked to report any new onset visual acuity decline or distortions in either eye 2.  Repeat intravitreal Eylea OS today 5-week interval of examination next in 6 weeks  3.  Ophthalmic Meds Ordered this visit:  Meds ordered this encounter  Medications  . aflibercept (EYLEA) SOLN 2 mg       Return in about 6 weeks (around 01/21/2020) for dilate, OS, EYLEA OCT.  There are no Patient Instructions on file for this visit.   Explained the diagnoses, plan, and follow up with the patient and they expressed understanding.  Patient expressed understanding of the importance of proper follow up care.  Clent Demark Amrit Cress M.D. Diseases & Surgery of the Retina and Vitreous [facility] [place] 12/10/19     Abbreviations: M myopia (nearsighted); A astigmatism; H hyperopia (farsighted); P presbyopia; Mrx spectacle prescription;  CTL contact lenses; OD right eye; OS left eye; OU both eyes  XT exotropia; ET esotropia; PEK punctate epithelial keratitis; PEE punctate epithelial erosions; DES dry eye syndrome; MGD meibomian gland dysfunction; ATs artificial tears; PFAT's preservative free artificial tears; [place] nuclear sclerotic cataract; PSC posterior subcapsular cataract;  ERM epi-retinal membrane; PVD posterior vitreous detachment; RD retinal detachment; DM diabetes mellitus; DR diabetic retinopathy; NPDR non-proliferative diabetic retinopathy; PDR proliferative diabetic retinopathy; CSME clinically significant macular edema; DME diabetic macular edema; dbh dot blot hemorrhages; CWS cotton wool spot; POAG primary open angle glaucoma; C/D cup-to-disc ratio; HVF humphrey visual field; GVF goldmann visual field; OCT optical coherence tomography; IOP intraocular pressure; BRVO Branch retinal vein occlusion; CRVO central retinal vein occlusion; CRAO central retinal artery occlusion; BRAO branch retinal artery occlusion; RT retinal tear; SB scleral buckle; PPV pars plana vitrectomy; VH Vitreous hemorrhage; PRP panretinal laser photocoagulation; IVK intravitreal kenalog; VMT vitreomacular traction; MH Macular hole;  NVD neovascularization of the disc; NVE neovascularization elsewhere; AREDS age related eye disease study; ARMD age related macular degeneration; POAG primary open angle glaucoma; EBMD epithelial/anterior basement membrane dystrophy; ACIOL anterior chamber intraocular lens; IOL intraocular lens; PCIOL posterior chamber intraocular lens; Phaco/IOL phacoemulsification with intraocular lens placement; [place] photorefractive keratectomy; LASIK laser assisted in situ keratomileusis; HTN hypertension; DM diabetes mellitus; COPD chronic obstructive pulmonary disease

## 2019-12-10 NOTE — Assessment & Plan Note (Signed)
OS, CME continues to do well currently at 5 weeks.  Has worsened in 7 weeks in the past on Avastin, will nonetheless repeat examination left eye in 6 weeks after intravitreal Eylea today

## 2019-12-11 ENCOUNTER — Encounter (INDEPENDENT_AMBULATORY_CARE_PROVIDER_SITE_OTHER): Payer: Medicare Other | Admitting: Ophthalmology

## 2020-01-08 ENCOUNTER — Other Ambulatory Visit: Payer: Self-pay

## 2020-01-08 ENCOUNTER — Encounter: Payer: Self-pay | Admitting: Internal Medicine

## 2020-01-08 ENCOUNTER — Ambulatory Visit (INDEPENDENT_AMBULATORY_CARE_PROVIDER_SITE_OTHER): Payer: Medicare Other | Admitting: Internal Medicine

## 2020-01-08 VITALS — BP 126/78 | HR 90 | Ht 64.0 in | Wt 145.2 lb

## 2020-01-08 DIAGNOSIS — E1140 Type 2 diabetes mellitus with diabetic neuropathy, unspecified: Secondary | ICD-10-CM

## 2020-01-08 DIAGNOSIS — E1142 Type 2 diabetes mellitus with diabetic polyneuropathy: Secondary | ICD-10-CM

## 2020-01-08 DIAGNOSIS — E1165 Type 2 diabetes mellitus with hyperglycemia: Secondary | ICD-10-CM | POA: Diagnosis not present

## 2020-01-08 DIAGNOSIS — E1169 Type 2 diabetes mellitus with other specified complication: Secondary | ICD-10-CM

## 2020-01-08 DIAGNOSIS — E785 Hyperlipidemia, unspecified: Secondary | ICD-10-CM | POA: Diagnosis not present

## 2020-01-08 LAB — POCT GLYCOSYLATED HEMOGLOBIN (HGB A1C): Hemoglobin A1C: 7.3 % — AB (ref 4.0–5.6)

## 2020-01-08 MED ORDER — REPAGLINIDE 1 MG PO TABS: 1.0000 mg | ORAL_TABLET | Freq: Every day | ORAL | 3 refills | Status: DC
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
## 2020-01-08 NOTE — Patient Instructions (Addendum)
Please continue: - Jardiance 25 mg before b'fast - Trulicity 3 mg weekly  Please start: - Prandin 1 mg before dinner  Also, for neuropathy: - alpha-lipoic acid 600 mg 2x a day  Please return in 3 months with your sugar log.

## 2020-01-08 NOTE — Addendum Note (Signed)
Addended by: Caprice Beaver T on: 01/08/2020 11:08 AM   Modules accepted: Orders

## 2020-01-12 ENCOUNTER — Other Ambulatory Visit: Payer: Self-pay | Admitting: Family Medicine

## 2020-01-12 NOTE — Telephone Encounter (Signed)
E-scribed refills.  Plz schedule wellness, cpe and lab visits.  

## 2020-01-20 ENCOUNTER — Other Ambulatory Visit: Payer: Self-pay | Admitting: Family Medicine

## 2020-01-22 ENCOUNTER — Encounter (INDEPENDENT_AMBULATORY_CARE_PROVIDER_SITE_OTHER): Payer: Self-pay | Admitting: Ophthalmology

## 2020-01-22 ENCOUNTER — Other Ambulatory Visit: Payer: Self-pay

## 2020-01-22 ENCOUNTER — Ambulatory Visit (INDEPENDENT_AMBULATORY_CARE_PROVIDER_SITE_OTHER): Payer: Medicare Other | Admitting: Ophthalmology

## 2020-01-22 DIAGNOSIS — H35352 Cystoid macular degeneration, left eye: Secondary | ICD-10-CM | POA: Diagnosis not present

## 2020-01-22 DIAGNOSIS — H34832 Tributary (branch) retinal vein occlusion, left eye, with macular edema: Secondary | ICD-10-CM

## 2020-01-22 MED ORDER — AFLIBERCEPT 2MG/0.05ML IZ SOLN FOR KALEIDOSCOPE
2.0000 mg | INTRAVITREAL | Status: AC | PRN
  Administered 2020-01-22: 2 mg via INTRAVITREAL

## 2020-01-22 NOTE — Progress Notes (Signed)
01/22/2020     CHIEF COMPLAINT Patient presents for Retina Follow Up   HISTORY OF PRESENT ILLNESS: Valerie Ball is a 74 y.o. female who presents to the clinic today for:   HPI    Retina Follow Up    Patient presents with  CRVO/BRVO.  In left eye.  Severity is moderate.  Duration of 6.  Since onset it is stable.  I, the attending physician,  performed the HPI with the patient and updated documentation appropriately.          Comments    6 Week BRVO f\u OS. Possible Eylea OS. OCT  Pt states she does not see well while working on the computer. BGL: 152 A1C: 7.3       Last edited by Tilda Franco on 01/22/2020 10:46 AM. (History)      Referring physician: Tower, Wynelle Fanny, MD [place],  [place] [address]  HISTORICAL INFORMATION:   Selected notes from the MEDICAL RECORD NUMBER    Lab Results  Component Value Date   HGBA1C 7.3 (A) 01/08/2020     CURRENT MEDICATIONS: No current outpatient medications on file. (Ophthalmic Drugs)   No current facility-administered medications for this visit. (Ophthalmic Drugs)   Current Outpatient Medications (Other)  Medication Sig  . Alpha-Lipoic Acid 600 MG TABS Take 600 mg by mouth 2 (two) times daily.  Marland Kitchen amLODipine (NORVASC) 5 MG tablet TAKE 1 TABLET DAILY  . aspirin EC 81 MG tablet Take 81 mg by mouth daily.  . Dulaglutide (TRULICITY) 3 SP/2.3RA SOPN Inject 0.5 mLs (3 mg total) into the skin once a week.  . empagliflozin (JARDIANCE) 25 MG TABS tablet Take 1 tablet (25 mg total) by mouth daily before breakfast.  . ezetimibe (ZETIA) 10 MG tablet TAKE 1 TABLET DAILY  . fluticasone (FLONASE) 50 MCG/ACT nasal spray Place 2 sprays into both nostrils daily as needed. Allergies  . hydrochlorothiazide (HYDRODIURIL) 25 MG tablet TAKE 1 TABLET DAILY  . levothyroxine (SYNTHROID) 50 MCG tablet TAKE 1 TABLET DAILY BEFORE BREAKFAST  . losartan (COZAAR) 100 MG tablet Take 1 tablet (100 mg total) by mouth daily. (Patient  not taking: Reported on 01/08/2020)  . Multiple Vitamin (MULTIVITAMIN WITH MINERALS) TABS tablet Take 1 tablet by mouth daily.  Marland Kitchen omeprazole (PRILOSEC) 20 MG capsule TAKE 1 CAPSULE TWICE A DAY BEFORE MEALS  . repaglinide (PRANDIN) 1 MG tablet Take 1 tablet (1 mg total) by mouth daily before supper.  Marland Kitchen rOPINIRole (REQUIP) 1 MG tablet TAKE 1 TABLET AT BEDTIME  . rosuvastatin (CRESTOR) 10 MG tablet Take one pill by mouth twice weekly  . sertraline (ZOLOFT) 100 MG tablet TAKE 1 TABLET AT BEDTIME (Patient not taking: Reported on 01/08/2020)   No current facility-administered medications for this visit. (Other)      REVIEW OF SYSTEMS: ROS    Positive for: Endocrine   Last edited by Tilda Franco on 01/22/2020 10:46 AM. (History)       ALLERGIES Allergies  Allergen Reactions  . Arimidex [Anastrozole]     Joint pain  . Glipizide Other (See Comments)    Hypoglycemia   . Metformin And Related     diarrhea  . Penicillins Rash    PAST MEDICAL HISTORY Past Medical History:  Diagnosis Date  . Allergy    allegic rhinitis  . Anemia   . Angiodysplasia of colon   . Anxiety   . Arthritis   . Blood transfusion   . Borderline  diabetic    per patient medical history form  . Breast cancer ([place]) 02/2009   breast CA L invasive ductal CA(hormone receptor positive.)  . Cataract   . Colon polyp    hyperplastic  . Colon stricture ([place]) 01/14/2003  . Diabetes mellitus    pt's medical doctor took her off diabetic meds-blood sugars have not been a problem--and medications were causing pt's sugars to be too low  . Diverticulosis   . Esophageal stricture 02/04/2008  . GERD (gastroesophageal reflux disease)   . GI bleed   . Hepatic steatosis   . Hiatal hernia   . Hyperlipidemia   . Hypertension   . Hypothyroidism   . IBS (irritable bowel syndrome)   . Melena 01/2008   anemia transfusion  . Menopause    per patient medical history form  . Osteoporosis   . Postmenopausal hormone  therapy    per patient medical history form.  Marland Kitchen RLS (restless legs syndrome)   . Salmonella 05/2000   renal effects secondary to dehydration  . Seizure disorder ([place])   . Seizures ([place])    one seizure several yrs ago --etiology unknown-no problem since   Past Surgical History:  Procedure Laterality Date  . ABDOMINAL HYSTERECTOMY  1983  . BREAST SURGERY  02/2009   breast biopsy invasive ductal CA-bilateral mastectomies  . EPIGASTRIC HERNIA REPAIR  01/02/2012   Procedure: HERNIA REPAIR EPIGASTRIC ADULT;  Surgeon: Pedro Earls, MD;  Location: [facility];  Service: General;  Laterality: N/A;  Laparoscopic Repair Paraesophageal Hernia, [place]  . EYE SURGERY Left 07/2017   Dr. Zadie Rhine  . LAPAROSCOPIC NISSEN FUNDOPLICATION  [id number]   Procedure: LAPAROSCOPIC NISSEN FUNDOPLICATION;  Surgeon: Pedro Earls, MD;  Location: [facility];  Service: General;  Laterality: N/A;  . MASTECTOMY  04/2009   Bilateral for ductal carcinoma on L  . OVARY SURGERY  1986   ovarian tumor removed    FAMILY HISTORY Family History  Problem Relation Age of Onset  . Heart attack Father   . Diabetes Father   . Esophageal cancer Father   . Breast cancer Sister        x 2 sisters  . Diabetes Sister   . Hypertension Brother   . Diabetes Brother   . Hypertension Mother   . Stroke Mother   . Breast cancer Mother   . Hypertension Sister     SOCIAL HISTORY Social History   Tobacco Use  . Smoking status: Never Smoker  . Smokeless tobacco: Never Used  Vaping Use  . Vaping Use: Never used  Substance Use Topics  . Alcohol use: No    Alcohol/week: 0.0 standard drinks  . Drug use: No         OPHTHALMIC EXAM:  Base Eye Exam    Visual Acuity (Snellen - Linear)      Right Left   Dist [place] 20/25 20/30       Tonometry (Tonopen, 10:45 AM)      Right Left   Pressure 8 6       Pupils      Pupils Dark Light Shape React APD   Right PERRL 4 3 Round Brisk None   Left PERRL 4 3 Round Brisk None        Visual Fields (Counting fingers)      Left Right    Full Full       Neuro/Psych    Oriented x3: Yes   Mood/Affect: Normal  Dilation    Left eye: 1.0% Mydriacyl, 2.5% Phenylephrine @ 10:45 AM        Slit Lamp and Fundus Exam    External Exam      Right Left   External Normal Normal       Slit Lamp Exam      Right Left   Lids/Lashes Normal Normal   Conjunctiva/Sclera White and quiet White and quiet   Cornea Clear Clear   Anterior Chamber Deep and quiet Deep and quiet   Iris Round and reactive Round and reactive   Lens Posterior chamber intraocular lens Posterior chamber intraocular lens   Anterior Vitreous Normal Normal       Fundus Exam      Right Left   Posterior Vitreous  Central vitreous floaters   Disc  Normal, collaterals on nerve   C/D Ratio  0.55   Macula  Macular thickening, Good Focal laser, Microaneurysms, Clinically significant macular edema   Vessels  Macular branch retinal vein occlusion superotemporal.   Periphery  Normal          IMAGING AND PROCEDURES  Imaging and Procedures for 01/22/20  OCT, Retina - OU - Both Eyes       Right Eye Quality was good. Scan locations included subfoveal. Central Foveal Thickness: 253. Progression has been stable.   Left Eye Quality was good. Scan locations included subfoveal. Central Foveal Thickness: 247. Progression has improved.   Notes Improved CME OS from superotemporal BRVO, will repeat injection today at 6-week interval and extend interval examination to 8 weeks       Intravitreal Injection, Pharmacologic Agent - OS - Left Eye       Time Out 01/22/2020. 12:02 PM. Confirmed correct patient, procedure, site, and patient consented.   Anesthesia Topical anesthesia was used. Anesthetic medications included [place] 3.5%.   Procedure Preparation included Ofloxacin , 10% betadine to eyelids, 5% betadine to ocular surface, Tobramycin 0.3%. A 30 gauge needle was used.   Injection:  2 mg  aflibercept Alfonse Flavors) SOLN   NDC: [id number], Lot: [id number]   Route: Intravitreal, Site: Left Eye, Waste: 0 mg  Post-op Post injection exam found visual acuity of at least counting fingers. The patient tolerated the procedure well. There were no complications. The patient received written and verbal post procedure care education. Post injection medications were not given.                 ASSESSMENT/PLAN:  Branch retinal vein occlusion with macular edema of left eye Improved CME OS from BRVO, also coincident with improved blood sugar control.  Repeat injection intravitreal Eylea OS today and examination in 8 weeks  Cystoid macular edema of left eye Component of the BRVO, improved      ICD-10-CM   1. Branch retinal vein occlusion with macular edema of left eye  H34.8320 OCT, Retina - OU - Both Eyes    Intravitreal Injection, Pharmacologic Agent - OS - Left Eye    aflibercept (EYLEA) SOLN 2 mg  2. Cystoid macular edema of left eye  H35.352     1.  Repeat intravitreal Eylea OS today at 6-week interval examination repeat in 8 weeks  2.  3.  Ophthalmic Meds Ordered this visit:  Meds ordered this encounter  Medications  . aflibercept (EYLEA) SOLN 2 mg       Return in about 8 weeks (around 03/18/2020) for dilate, OS, EYLEA OCT.  There are no Patient Instructions on file for this visit.  Explained the diagnoses, plan, and follow up with the patient and they expressed understanding.  Patient expressed understanding of the importance of proper follow up care.   Clent Demark Ozro Russett M.D. Diseases & Surgery of the Retina and Vitreous [facility] [place] 01/22/20     Abbreviations: M myopia (nearsighted); A astigmatism; H hyperopia (farsighted); P presbyopia; Mrx spectacle prescription;  CTL contact lenses; OD right eye; OS left eye; OU both eyes  XT exotropia; ET esotropia; PEK punctate epithelial keratitis; PEE punctate epithelial erosions; DES dry eye syndrome;  MGD meibomian gland dysfunction; ATs artificial tears; PFAT's preservative free artificial tears; [place] nuclear sclerotic cataract; PSC posterior subcapsular cataract; ERM epi-retinal membrane; PVD posterior vitreous detachment; RD retinal detachment; DM diabetes mellitus; DR diabetic retinopathy; NPDR non-proliferative diabetic retinopathy; PDR proliferative diabetic retinopathy; CSME clinically significant macular edema; DME diabetic macular edema; dbh dot blot hemorrhages; CWS cotton wool spot; POAG primary open angle glaucoma; C/D cup-to-disc ratio; HVF humphrey visual field; GVF goldmann visual field; OCT optical coherence tomography; IOP intraocular pressure; BRVO Branch retinal vein occlusion; CRVO central retinal vein occlusion; CRAO central retinal artery occlusion; BRAO branch retinal artery occlusion; RT retinal tear; SB scleral buckle; PPV pars plana vitrectomy; VH Vitreous hemorrhage; PRP panretinal laser photocoagulation; IVK intravitreal kenalog; VMT vitreomacular traction; MH Macular hole;  NVD neovascularization of the disc; NVE neovascularization elsewhere; AREDS age related eye disease study; ARMD age related macular degeneration; POAG primary open angle glaucoma; EBMD epithelial/anterior basement membrane dystrophy; ACIOL anterior chamber intraocular lens; IOL intraocular lens; PCIOL posterior chamber intraocular lens; Phaco/IOL phacoemulsification with intraocular lens placement; [place] photorefractive keratectomy; LASIK laser assisted in situ keratomileusis; HTN hypertension; DM diabetes mellitus; COPD chronic obstructive pulmonary disease

## 2020-01-22 NOTE — Assessment & Plan Note (Signed)
Component of the BRVO, improved

## 2020-01-22 NOTE — Assessment & Plan Note (Signed)
Improved CME OS from BRVO, also coincident with improved blood sugar control.  Repeat injection intravitreal Eylea OS today and examination in 8 weeks

## 2020-02-22 ENCOUNTER — Other Ambulatory Visit: Payer: Self-pay | Admitting: Internal Medicine

## 2020-03-03 ENCOUNTER — Other Ambulatory Visit: Payer: Self-pay | Admitting: Internal Medicine

## 2020-03-04 ENCOUNTER — Telehealth: Payer: Self-pay | Admitting: Family Medicine

## 2020-03-04 DIAGNOSIS — D649 Anemia, unspecified: Secondary | ICD-10-CM

## 2020-03-04 DIAGNOSIS — E1165 Type 2 diabetes mellitus with hyperglycemia: Secondary | ICD-10-CM

## 2020-03-04 DIAGNOSIS — E039 Hypothyroidism, unspecified: Secondary | ICD-10-CM

## 2020-03-04 DIAGNOSIS — E538 Deficiency of other specified B group vitamins: Secondary | ICD-10-CM

## 2020-03-04 DIAGNOSIS — I10 Essential (primary) hypertension: Secondary | ICD-10-CM

## 2020-03-04 DIAGNOSIS — E1169 Type 2 diabetes mellitus with other specified complication: Secondary | ICD-10-CM

## 2020-03-04 DIAGNOSIS — D696 Thrombocytopenia, unspecified: Secondary | ICD-10-CM

## 2020-03-04 NOTE — Telephone Encounter (Signed)
-----   Message from Cloyd Stagers, RT sent at 02/18/2020  3:05 PM EST ----- Regarding: Lab Orders for Friday 12.17.2021 Please place lab orders for Friday 12.17.2021, office visit for physical on Tuesday 12.21.2021 Thank you, Dyke Maes RT(R)

## 2020-03-05 ENCOUNTER — Other Ambulatory Visit: Payer: Self-pay

## 2020-03-05 ENCOUNTER — Other Ambulatory Visit (INDEPENDENT_AMBULATORY_CARE_PROVIDER_SITE_OTHER): Payer: Medicare Other

## 2020-03-05 ENCOUNTER — Ambulatory Visit (INDEPENDENT_AMBULATORY_CARE_PROVIDER_SITE_OTHER): Payer: Medicare Other

## 2020-03-05 DIAGNOSIS — I10 Essential (primary) hypertension: Secondary | ICD-10-CM | POA: Diagnosis not present

## 2020-03-05 DIAGNOSIS — E1169 Type 2 diabetes mellitus with other specified complication: Secondary | ICD-10-CM | POA: Diagnosis not present

## 2020-03-05 DIAGNOSIS — E538 Deficiency of other specified B group vitamins: Secondary | ICD-10-CM | POA: Diagnosis not present

## 2020-03-05 DIAGNOSIS — D649 Anemia, unspecified: Secondary | ICD-10-CM | POA: Diagnosis not present

## 2020-03-05 DIAGNOSIS — E039 Hypothyroidism, unspecified: Secondary | ICD-10-CM | POA: Diagnosis not present

## 2020-03-05 DIAGNOSIS — E785 Hyperlipidemia, unspecified: Secondary | ICD-10-CM | POA: Diagnosis not present

## 2020-03-05 DIAGNOSIS — Z0000 Encounter for general adult medical examination without abnormal findings: Secondary | ICD-10-CM

## 2020-03-05 DIAGNOSIS — D696 Thrombocytopenia, unspecified: Secondary | ICD-10-CM | POA: Diagnosis not present

## 2020-03-05 LAB — COMPREHENSIVE METABOLIC PANEL
ALT: 18 U/L (ref 0–35)
AST: 32 U/L (ref 0–37)
Albumin: 4.5 g/dL (ref 3.5–5.2)
Alkaline Phosphatase: 77 U/L (ref 39–117)
BUN: 24 mg/dL — ABNORMAL HIGH (ref 6–23)
CO2: 31 mEq/L (ref 19–32)
Calcium: 9.5 mg/dL (ref 8.4–10.5)
Chloride: 102 mEq/L (ref 96–112)
Creatinine, Ser: 0.9 mg/dL (ref 0.40–1.20)
GFR: 63.85 mL/min (ref >60.00)
Glucose, Bld: 116 mg/dL — ABNORMAL HIGH (ref 70–99)
Potassium: 3.5 mEq/L (ref 3.5–5.1)
Sodium: 142 mEq/L (ref 135–145)
Total Bilirubin: 0.5 mg/dL (ref 0.2–1.2)
Total Protein: 7.6 g/dL (ref 6.0–8.3)

## 2020-03-05 LAB — CBC WITH DIFFERENTIAL/PLATELET
Basophils Absolute: 0 10*3/uL (ref 0.0–0.1)
Basophils Relative: 0.3 % (ref 0.0–3.0)
Eosinophils Absolute: 0.1 10*3/uL (ref 0.0–0.7)
Eosinophils Relative: 1.9 % (ref 0.0–5.0)
HCT: 40.5 % (ref 36.0–46.0)
Hemoglobin: 13.4 g/dL (ref 12.0–15.0)
Lymphocytes Relative: 28.7 % (ref 12.0–46.0)
Lymphs Abs: 1.3 10*3/uL (ref 0.7–4.0)
MCHC: 33.2 g/dL (ref 30.0–36.0)
MCV: 80.2 fl (ref 78.0–100.0)
Monocytes Absolute: 0.5 10*3/uL (ref 0.1–1.0)
Monocytes Relative: 10.6 % (ref 3.0–12.0)
Neutro Abs: 2.6 10*3/uL (ref 1.4–7.7)
Neutrophils Relative %: 58.5 % (ref 43.0–77.0)
Platelets: 95 10*3/uL — ABNORMAL LOW (ref 150.0–400.0)
RBC: 5.05 Mil/uL (ref 3.87–5.11)
RDW: 16.4 % — ABNORMAL HIGH (ref 11.5–15.5)
WBC: 4.5 10*3/uL (ref 4.0–10.5)

## 2020-03-05 LAB — LIPID PANEL
Cholesterol: 96 mg/dL (ref 0–200)
HDL: 31 mg/dL — ABNORMAL LOW (ref >39.00)
LDL Cholesterol: 31 mg/dL (ref 0–99)
NonHDL: 64.54
Total CHOL/HDL Ratio: 3
Triglycerides: 166 mg/dL — ABNORMAL HIGH (ref 0.0–149.0)
VLDL: 33.2 mg/dL (ref 0.0–40.0)

## 2020-03-05 LAB — VITAMIN B12: Vitamin B-12: 335 pg/mL (ref 211–911)

## 2020-03-05 LAB — TSH: TSH: 2.53 u[IU]/mL (ref 0.35–4.50)

## 2020-03-05 LAB — FERRITIN: Ferritin: 20.5 ng/mL (ref 10.0–291.0)

## 2020-03-05 NOTE — Patient Instructions (Signed)
Valerie Ball , Thank you for taking time to come for your Medicare Wellness Visit. I appreciate your ongoing commitment to your health goals. Please review the following plan we discussed and let me know if I can assist you in the future.   Screening recommendations/referrals: Colonoscopy: Up to date, completed 03/24/2014, due 03/2024 Mammogram: no longer required Bone Density: declined Recommended yearly ophthalmology/optometry visit for glaucoma screening and checkup Recommended yearly dental visit for hygiene and checkup  Vaccinations: Influenza vaccine: Up to date, completed 12/19/2019, due 10/2020 Pneumococcal vaccine: Completed series Tdap vaccine: Up to date, completed 07/19/2011, due 07/2021 Shingles vaccine: Completed series   Covid-19:Completed series  Advanced directives: Advance directive discussed with you today. I have provided a copy for you to complete at home and have notarized. Once this is complete please bring a copy in to our office so we can scan it into your chart.  Conditions/risks identified: Diabetes, Hyperlipidemia   Next appointment: Follow up in one year for your annual wellness visit    Preventive Care 72 Years and Older, Female Preventive care refers to lifestyle choices and visits with your health care provider that can promote health and wellness. What does preventive care include?  A yearly physical exam. This is also called an annual well check.  Dental exams once or twice a year.  Routine eye exams. Ask your health care provider how often you should have your eyes checked.  Personal lifestyle choices, including:  Daily care of your teeth and gums.  Regular physical activity.  Eating a healthy diet.  Avoiding tobacco and drug use.  Limiting alcohol use.  Practicing safe sex.  Taking low-dose aspirin every day.  Taking vitamin and mineral supplements as recommended by your health care provider. What happens during an annual well check? The  services and screenings done by your health care provider during your annual well check will depend on your age, overall health, lifestyle risk factors, and family history of disease. Counseling  Your health care provider may ask you questions about your:  Alcohol use.  Tobacco use.  Drug use.  Emotional well-being.  Home and relationship well-being.  Sexual activity.  Eating habits.  History of falls.  Memory and ability to understand (cognition).  Work and work Statistician.  Reproductive health. Screening  You may have the following tests or measurements:  Height, weight, and BMI.  Blood pressure.  Lipid and cholesterol levels. These may be checked every 5 years, or more frequently if you are over 54 years old.  Skin check.  Lung cancer screening. You may have this screening every year starting at age 51 if you have a 30-pack-year history of smoking and currently smoke or have quit within the past 15 years.  Fecal occult blood test (FOBT) of the stool. You may have this test every year starting at age 23.  Flexible sigmoidoscopy or colonoscopy. You may have a sigmoidoscopy every 5 years or a colonoscopy every 10 years starting at age 35.  Hepatitis C blood test.  Hepatitis B blood test.  Sexually transmitted disease (STD) testing.  Diabetes screening. This is done by checking your blood sugar (glucose) after you have not eaten for a while (fasting). You may have this done every 1-3 years.  Bone density scan. This is done to screen for osteoporosis. You may have this done starting at age 50.  Mammogram. This may be done every 1-2 years. Talk to your health care provider about how often you should have regular mammograms.  Talk with your health care provider about your test results, treatment options, and if necessary, the need for more tests. Vaccines  Your health care provider may recommend certain vaccines, such as:  Influenza vaccine. This is recommended  every year.  Tetanus, diphtheria, and acellular pertussis (Tdap, Td) vaccine. You may need a Td booster every 10 years.  Zoster vaccine. You may need this after age 29.  Pneumococcal 13-valent conjugate (PCV13) vaccine. One dose is recommended after age 79.  Pneumococcal polysaccharide (PPSV23) vaccine. One dose is recommended after age 29. Talk to your health care provider about which screenings and vaccines you need and how often you need them. This information is not intended to replace advice given to you by your health care provider. Make sure you discuss any questions you have with your health care provider. Document Released: 04/02/2015 Document Revised: 11/24/2015 Document Reviewed: 01/05/2015 Elsevier Interactive Patient Education  2017 [place] Prevention in the Home Falls can cause injuries. They can happen to people of all ages. There are many things you can do to make your home safe and to help prevent falls. What can I do on the outside of my home?  Regularly fix the edges of walkways and driveways and fix any cracks.  Remove anything that might make you trip as you walk through a door, such as a raised step or threshold.  Trim any bushes or trees on the path to your home.  Use bright outdoor lighting.  Clear any walking paths of anything that might make someone trip, such as rocks or tools.  Regularly check to see if handrails are loose or broken. Make sure that both sides of any steps have handrails.  Any raised decks and porches should have guardrails on the edges.  Have any leaves, snow, or ice cleared regularly.  Use sand or salt on walking paths during winter.  Clean up any spills in your garage right away. This includes oil or grease spills. What can I do in the bathroom?  Use night lights.  Install grab bars by the toilet and in the tub and shower. Do not use towel bars as grab bars.  Use non-skid mats or decals in the tub or shower.  If  you need to sit down in the shower, use a plastic, non-slip stool.  Keep the floor dry. Clean up any water that spills on the floor as soon as it happens.  Remove soap buildup in the tub or shower regularly.  Attach bath mats securely with double-sided non-slip rug tape.  Do not have throw rugs and other things on the floor that can make you trip. What can I do in the bedroom?  Use night lights.  Make sure that you have a light by your bed that is easy to reach.  Do not use any sheets or blankets that are too big for your bed. They should not hang down onto the floor.  Have a firm chair that has side arms. You can use this for support while you get dressed.  Do not have throw rugs and other things on the floor that can make you trip. What can I do in the kitchen?  Clean up any spills right away.  Avoid walking on wet floors.  Keep items that you use a lot in easy-to-reach places.  If you need to reach something above you, use a strong step stool that has a grab bar.  Keep electrical cords out of the way.  Do not use floor polish or wax that makes floors slippery. If you must use wax, use non-skid floor wax.  Do not have throw rugs and other things on the floor that can make you trip. What can I do with my stairs?  Do not leave any items on the stairs.  Make sure that there are handrails on both sides of the stairs and use them. Fix handrails that are broken or loose. Make sure that handrails are as long as the stairways.  Check any carpeting to make sure that it is firmly attached to the stairs. Fix any carpet that is loose or worn.  Avoid having throw rugs at the top or bottom of the stairs. If you do have throw rugs, attach them to the floor with carpet tape.  Make sure that you have a light switch at the top of the stairs and the bottom of the stairs. If you do not have them, ask someone to add them for you. What else can I do to help prevent falls?  Wear shoes  that:  Do not have high heels.  Have rubber bottoms.  Are comfortable and fit you well.  Are closed at the toe. Do not wear sandals.  If you use a stepladder:  Make sure that it is fully opened. Do not climb a closed stepladder.  Make sure that both sides of the stepladder are locked into place.  Ask someone to hold it for you, if possible.  Clearly mark and make sure that you can see:  Any grab bars or handrails.  First and last steps.  Where the edge of each step is.  Use tools that help you move around (mobility aids) if they are needed. These include:  Canes.  Walkers.  Scooters.  Crutches.  Turn on the lights when you go into a dark area. Replace any light bulbs as soon as they burn out.  Set up your furniture so you have a clear path. Avoid moving your furniture around.  If any of your floors are uneven, fix them.  If there are any pets around you, be aware of where they are.  Review your medicines with your doctor. Some medicines can make you feel dizzy. This can increase your chance of falling. Ask your doctor what other things that you can do to help prevent falls. This information is not intended to replace advice given to you by your health care provider. Make sure you discuss any questions you have with your health care provider. Document Released: 12/31/2008 Document Revised: 08/12/2015 Document Reviewed: 04/10/2014 Elsevier Interactive Patient Education  2017 [employer].

## 2020-03-05 NOTE — Progress Notes (Signed)
,  PCP notes:  Health Maintenance: Foot exam- due Dexa- declined at this time   Abnormal Screenings: none   Patient concerns: none   Nurse concerns: none   Next PCP appt.: 03/09/2020 @ 9:30 am

## 2020-03-05 NOTE — Progress Notes (Signed)
Subjective:   Valerie Ball is a 74 y.o. female who presents for Medicare Annual (Subsequent) preventive examination.  Review of Systems: N/A      I connected with the patient today by telephone and verified that I am speaking with the correct person using two identifiers. Location patient: home Location nurse: work Persons participating in the telephone visit: patient, nurse.   I discussed the limitations, risks, security and privacy concerns of performing an evaluation and management service by telephone and the availability of in person appointments. I also discussed with the patient that there may be a patient responsible charge related to this service. The patient expressed understanding and verbally consented to this telephonic visit.        Cardiac Risk Factors include: advanced age (>44men, >65 women);diabetes mellitus;Other (see comment), Risk factor comments: hyperlipidemia     Objective:    Today's Vitals   There is no height or weight on file to calculate BMI.  Advanced Directives 03/05/2020 09/06/2018 08/31/2017 08/22/2016 06/30/2015 05/28/2014 02/05/2014  Does Patient Have a Medical Advance Directive? No No No No No No No  Would patient like information on creating a medical advance directive? Yes (MAU/Ambulatory/Procedural Areas - Information given) No - Patient declined No - Patient declined - No - patient declined information No - patient declined information -  Pre-existing out of facility DNR order (yellow form or pink MOST form) - - - - - - -    Current Medications (verified) Outpatient Encounter Medications as of 03/05/2020  Medication Sig  . Alpha-Lipoic Acid 600 MG TABS Take 600 mg by mouth 2 (two) times daily.  Marland Kitchen amLODipine (NORVASC) 5 MG tablet TAKE 1 TABLET DAILY  . aspirin EC 81 MG tablet Take 81 mg by mouth daily.  . Dulaglutide (TRULICITY) 3 BR/8.3EN SOPN Inject 0.5 mLs (3 mg total) into the skin once a week.  . empagliflozin (JARDIANCE) 25 MG TABS  tablet Take 1 tablet (25 mg total) by mouth daily before breakfast.  . ezetimibe (ZETIA) 10 MG tablet TAKE 1 TABLET DAILY  . fluticasone (FLONASE) 50 MCG/ACT nasal spray Place 2 sprays into both nostrils daily as needed. Allergies  . hydrochlorothiazide (HYDRODIURIL) 25 MG tablet TAKE 1 TABLET DAILY  . levothyroxine (SYNTHROID) 50 MCG tablet TAKE 1 TABLET DAILY BEFORE BREAKFAST  . losartan (COZAAR) 100 MG tablet Take 1 tablet (100 mg total) by mouth daily.  . Multiple Vitamin (MULTIVITAMIN WITH MINERALS) TABS tablet Take 1 tablet by mouth daily.  Marland Kitchen omeprazole (PRILOSEC) 20 MG capsule TAKE 1 CAPSULE TWICE A DAY BEFORE MEALS  . repaglinide (PRANDIN) 1 MG tablet Take 1 tablet (1 mg total) by mouth daily before supper.  Marland Kitchen rOPINIRole (REQUIP) 1 MG tablet TAKE 1 TABLET AT BEDTIME  . rosuvastatin (CRESTOR) 10 MG tablet Take one pill by mouth twice weekly  . sertraline (ZOLOFT) 100 MG tablet TAKE 1 TABLET AT BEDTIME  . [DISCONTINUED] cetirizine (ZYRTEC) 10 MG tablet Take 10 mg by mouth daily as needed. For allergy symptoms   No facility-administered encounter medications on file as of 03/05/2020.    Allergies (verified) Arimidex [anastrozole], Glipizide, Metformin and related, and Penicillins   History: Past Medical History:  Diagnosis Date  . Allergy    allegic rhinitis  . Anemia   . Angiodysplasia of colon   . Anxiety   . Arthritis   . Blood transfusion   . Borderline diabetic    per patient medical history form  . Breast cancer ([place]) 02/2009  breast CA L invasive ductal CA(hormone receptor positive.)  . Cataract   . Colon polyp    hyperplastic  . Colon stricture ([place]) 01/14/2003  . Diabetes mellitus    pt's medical doctor took her off diabetic meds-blood sugars have not been a problem--and medications were causing pt's sugars to be too low  . Diverticulosis   . Esophageal stricture 02/04/2008  . GERD (gastroesophageal reflux disease)   . GI bleed   . Hepatic steatosis   .  Hiatal hernia   . Hyperlipidemia   . Hypertension   . Hypothyroidism   . IBS (irritable bowel syndrome)   . Melena 01/2008   anemia transfusion  . Menopause    per patient medical history form  . Osteoporosis   . Postmenopausal hormone therapy    per patient medical history form.  Marland Kitchen RLS (restless legs syndrome)   . Salmonella 05/2000   renal effects secondary to dehydration  . Seizure disorder ([place])   . Seizures ([place])    one seizure several yrs ago --etiology unknown-no problem since   Past Surgical History:  Procedure Laterality Date  . ABDOMINAL HYSTERECTOMY  1983  . BREAST SURGERY  02/2009   breast biopsy invasive ductal CA-bilateral mastectomies  . EPIGASTRIC HERNIA REPAIR  01/02/2012   Procedure: HERNIA REPAIR EPIGASTRIC ADULT;  Surgeon: Pedro Earls, MD;  Location: [facility];  Service: General;  Laterality: N/A;  Laparoscopic Repair Paraesophageal Hernia, [place]  . EYE SURGERY Left 07/2017   Dr. Zadie Rhine  . LAPAROSCOPIC NISSEN FUNDOPLICATION  00/92/3300   Procedure: LAPAROSCOPIC NISSEN FUNDOPLICATION;  Surgeon: Pedro Earls, MD;  Location: [facility];  Service: General;  Laterality: N/A;  . MASTECTOMY  04/2009   Bilateral for ductal carcinoma on L  . OVARY SURGERY  1986   ovarian tumor removed   Family History  Problem Relation Age of Onset  . Heart attack Father   . Diabetes Father   . Esophageal cancer Father   . Breast cancer Sister        x 2 sisters  . Diabetes Sister   . Hypertension Brother   . Diabetes Brother   . Hypertension Mother   . Stroke Mother   . Breast cancer Mother   . Hypertension Sister    Social History   Socioeconomic History  . Marital status: Married    Spouse name: Not on file  . Number of children: 3  . Years of education: Not on file  . Highest education level: Not on file  Occupational History  . Occupation: retired    Fish farm manager: UNEMPLOYED  Tobacco Use  . Smoking status: Never Smoker  . Smokeless tobacco: Never Used   Vaping Use  . Vaping Use: Never used  Substance and Sexual Activity  . Alcohol use: No    Alcohol/week: 0.0 standard drinks  . Drug use: No  . Sexual activity: Not Currently  Other Topics Concern  . Not on file  Social History Narrative   1 cup of coffee every morning   Social Determinants of Health   Financial Resource Strain: Low Risk   . Difficulty of Paying Living Expenses: Not hard at all  Food Insecurity: No Food Insecurity  . Worried About Charity fundraiser in the Last Year: Never true  . Ran Out of Food in the Last Year: Never true  Transportation Needs: No Transportation Needs  . Lack of Transportation (Medical): No  . Lack of Transportation (Non-Medical): No  Physical Activity: Inactive  .  Days of Exercise per Week: 0 days  . Minutes of Exercise per Session: 0 min  Stress: No Stress Concern Present  . Feeling of Stress : Only a little  Social Connections: Not on file    Tobacco Counseling Counseling given: Not Answered   Clinical Intake:  Pre-visit preparation completed: Yes  Pain : No/denies pain     Nutritional Risks: None Diabetes: Yes CBG done?: No Did pt. bring in CBG monitor from home?: No  How often do you need to have someone help you when you read instructions, pamphlets, or other written materials from your doctor or pharmacy?: 1 - Never What is the last grade level you completed in school?: 12th, some college  Diabetic: No Nutrition Risk Assessment:  Has the patient had any N/V/D within the last 2 months?  No  Does the patient have any non-healing wounds?  No  Has the patient had any unintentional weight loss or weight gain?  No   Diabetes:  Is the patient diabetic?  Yes If diabetic, was a CBG obtained today?  No telephone visit  Did the patient bring in their glucometer from home?  No telephone visit How often do you monitor your CBG's? unknown.   Financial Strains and Diabetes Management:  Are you having any financial  strains with the device, your supplies or your medication? No.  Does the patient want to be seen by Chronic Care Management for management of their diabetes?  No Would the patient like to be referred to a Nutritionist or for Diabetic Management?  No  Diabetic eye exam: completed 01/22/2020 Foot Exam: due, office visit 03/09/2020   Interpreter Needed?: No  Information entered by :: CJohnson, LPN   Activities of Daily Living In your present state of health, do you have any difficulty performing the following activities: 03/05/2020  Hearing? N  Vision? Y  Comment has some vision issues  Difficulty concentrating or making decisions? N  Walking or climbing stairs? N  Dressing or bathing? N  Doing errands, shopping? N  Preparing Food and eating ? N  Using the Toilet? N  In the past six months, have you accidently leaked urine? Y  Comment only when holding it too long  Do you have problems with loss of bowel control? N  Managing your Medications? N  Managing your Finances? N  Housekeeping or managing your Housekeeping? N  Some recent data might be hidden    Patient Care Team: Tower, Wynelle Fanny, MD as PCP - General  Indicate any recent Medical Services you may have received from other than [facility] providers in the past year (date may be approximate).     Assessment:   This is a routine wellness examination for Maurya.  Hearing/Vision screen  Hearing Screening   125Hz  250Hz  500Hz  1000Hz  2000Hz  3000Hz  4000Hz  6000Hz  8000Hz   Right ear:           Left ear:           Vision Screening Comments: Patient gets annual eye exams   Dietary issues and exercise activities discussed: Current Exercise Habits: The patient does not participate in regular exercise at present, Exercise limited by: None identified  Goals    . Patient Stated     Starting 09/09/18, I will continue to take medication as prescribed.     . Patient Stated     03/05/2020, I will maintain and continue medications as  prescribed.       Depression Screen PHQ 2/9 Scores 03/05/2020 09/06/2018  08/31/2017 08/22/2016 01/26/2015 01/07/2014 12/06/2012  PHQ - 2 Score 0 0 4 0 0 0 0  PHQ- 9 Score 0 0 15 - - - -    Fall Risk Fall Risk  03/05/2020 09/06/2018 08/31/2017 08/22/2016 02/24/2016  Falls in the past year? 0 0 Yes No No  Comment - - pt lost balance and fell off bed - Emmi Telephone Survey: data to providers prior to load  Number falls in past yr: 0 - 1 - -  Injury with Fall? 0 - Yes - -  Comment - - bruise to right knee - -  Risk for fall due to : Medication side effect - - - -  Follow up Falls evaluation completed;Falls prevention discussed - - - -    FALL RISK PREVENTION PERTAINING TO THE HOME:  Any stairs in or around the home? Yes  If so, are there any without handrails? No  Home free of loose throw rugs in walkways, pet beds, electrical cords, etc? Yes  Adequate lighting in your home to reduce risk of falls? Yes   ASSISTIVE DEVICES UTILIZED TO PREVENT FALLS:  Life alert? No  Use of a cane, walker or w/c? No  Grab bars in the bathroom? No  Shower chair or bench in shower? No  Elevated toilet seat or a handicapped toilet? No   TIMED UP AND GO:  Was the test performed? N/A, telephone visit.  Cognitive Function: MMSE - Mini Mental State Exam 03/05/2020 09/06/2018 08/31/2017 08/22/2016  Orientation to time 5 5 5 5   Orientation to Place 5 5 5 5   Registration 3 3 3 3   Attention/ Calculation 5 0 0 0  Recall 3 3 3 3   Language- name 2 objects - 0 0 0  Language- repeat 1 1 1 1   Language- follow 3 step command - 0 3 3  Language- read & follow direction - 0 0 0  Write a sentence - 0 0 0  Copy design - 0 0 0  Total score - 17 20 20   Mini Cog  Mini-Cog screen was completed. Maximum score is 22. A value of 0 denotes this part of the MMSE was not completed or the patient failed this part of the Mini-Cog screening.       Immunizations Immunization History  Administered Date(s) Administered  . Fluad  Quad(high Dose 65+) 11/19/2018  . Influenza Split 01/17/2011  . Influenza Whole 04/04/2005, 01/22/2009  . Influenza, High Dose Seasonal PF 01/10/2018  . Influenza,inj,Quad PF,6+ Mos 12/06/2012, 11/25/2013, 01/26/2015, 11/12/2015, 03/29/2017  . Influenza-Unspecified 12/19/2019  . PFIZER SARS-COV-2 Vaccination 04/21/2019, 05/19/2019, 12/19/2019  . Pneumococcal Conjugate-13 01/26/2015  . Pneumococcal Polysaccharide-23 02/10/2008, 01/23/2014  . Tdap 07/19/2011  . Zoster Recombinat (Shingrix) 01/10/2018, 04/21/2018    TDAP status: Up to date  Flu Vaccine status: Up to date  Pneumococcal vaccine status: Up to date  Covid-19 vaccine status: Completed vaccines  Qualifies for Shingles Vaccine? Yes   Zostavax completed No  Shingrix Completed?: Yes  Screening Tests Health Maintenance  Topic Date Due  . FOOT EXAM  10/02/2019  . MAMMOGRAM  07/18/2020 (Originally 03/24/2011)  . Hepatitis C Screening  04/22/2023 (Originally [birth year])  . COVID-19 Vaccine (4 - Booster for Pfizer series) 06/18/2020  . HEMOGLOBIN A1C  07/08/2020  . OPHTHALMOLOGY EXAM  01/21/2021  . TETANUS/TDAP  07/18/2021  . COLONOSCOPY  03/24/2024  . INFLUENZA VACCINE  Completed  . DEXA SCAN  Completed  . PNA vac Low Risk Adult  Completed    Health  Maintenance  Health Maintenance Due  Topic Date Due  . FOOT EXAM  10/02/2019    Colorectal cancer screening: Type of screening: Colonoscopy. Completed 03/24/2014. Repeat every 10 years  Mammogram status: No longer required due to double masectomy.  Bone Density status: declined  Lung Cancer Screening: (Low Dose CT Chest recommended if Age 27-80 years, 30 pack-year currently smoking OR have quit w/in 15years.) does not qualify.    Additional Screening:  Hepatitis C Screening: does qualify; Completed declined  Vision Screening: Recommended annual ophthalmology exams for early detection of glaucoma and other disorders of the eye. Is the patient up to date with their  annual eye exam?  Yes  Who is the provider or what is the name of the office in which the patient attends annual eye exams? Dr. Zadie Rhine If pt is not established with a provider, would they like to be referred to a provider to establish care? No .   Dental Screening: Recommended annual dental exams for proper oral hygiene  Community Resource Referral / Chronic Care Management: CRR required this visit?  No   CCM required this visit?  No      Plan:     I have personally reviewed and noted the following in the patient's chart:   . Medical and social history . Use of alcohol, tobacco or illicit drugs  . Current medications and supplements . Functional ability and status . Nutritional status . Physical activity . Advanced directives . List of other physicians . Hospitalizations, surgeries, and ER visits in previous 12 months . Vitals . Screenings to include cognitive, depression, and falls . Referrals and appointments  In addition, I have reviewed and discussed with patient certain preventive protocols, quality metrics, and best practice recommendations. A written personalized care plan for preventive services as well as general preventive health recommendations were provided to patient.   Due to this being a telephonic visit, the after visit summary with patients personalized plan was offered to patient via office or my-chart. Patient preferred to pick up at office at next visit or mychart.   Andrez Grime, LPN   [id number]

## 2020-03-07 ENCOUNTER — Encounter: Payer: Self-pay | Admitting: Family Medicine

## 2020-03-08 ENCOUNTER — Other Ambulatory Visit: Payer: Self-pay

## 2020-03-08 ENCOUNTER — Other Ambulatory Visit: Payer: Self-pay | Admitting: Critical Care Medicine

## 2020-03-08 DIAGNOSIS — Z20822 Contact with and (suspected) exposure to covid-19: Secondary | ICD-10-CM

## 2020-03-08 NOTE — Telephone Encounter (Signed)
Valerie Ball spoke with pt and changed appt to virtual

## 2020-03-09 ENCOUNTER — Telehealth (INDEPENDENT_AMBULATORY_CARE_PROVIDER_SITE_OTHER): Payer: Medicare Other | Admitting: Family Medicine

## 2020-03-09 ENCOUNTER — Encounter: Payer: Self-pay | Admitting: Family Medicine

## 2020-03-09 ENCOUNTER — Other Ambulatory Visit: Payer: Medicare Other

## 2020-03-09 ENCOUNTER — Other Ambulatory Visit: Payer: Self-pay

## 2020-03-09 ENCOUNTER — Other Ambulatory Visit (INDEPENDENT_AMBULATORY_CARE_PROVIDER_SITE_OTHER): Payer: Medicare Other | Admitting: Family Medicine

## 2020-03-09 DIAGNOSIS — J069 Acute upper respiratory infection, unspecified: Secondary | ICD-10-CM

## 2020-03-09 LAB — POC INFLUENZA A&B (BINAX/QUICKVUE)
Influenza A, POC: NEGATIVE
Influenza B, POC: NEGATIVE

## 2020-03-09 NOTE — Assessment & Plan Note (Signed)
And low grade temp  Fully immunized for covid and flu  No known contacts  Had a covid test in [place] yesterday-pending result  Schedule drive through flu swab today  Adv rest/fluids/nasal saline mucinex if helpful  Analgesic for fever/pain  inst to update if symptoms worsen (esp if sob)-and go to ER if severe inst to call us when covid test returns Plan to follow

## 2020-03-09 NOTE — Patient Instructions (Signed)
Drink fluids and rest  Nasal saline may help with congestion  Continue mucinex if helpful  Treat fever if needed If you become short of breath or symptoms suddenly worsen let us know and go to the ER  Let us know when covid test result comes back  The office will call you to schedule a flu test as well

## 2020-03-09 NOTE — Progress Notes (Signed)
Virtual Visit via Video Note  I connected with [name] on 03/09/20 at  9:30 AM EST by a video enabled telemedicine application and verified that I am speaking with the correct person using two identifiers.  Location: Patient: home Provider: office   I discussed the limitations of evaluation and management by telemedicine and the availability of in person appointments. The patient expressed understanding and agreed to proceed.  Parties involved in encounter  Patient: Valerie Ball  Provider:  Loura Pardon MD   History of Present Illness: Pt presents with uri symptoms   Saturday nt started fever  Highest 100.8 With chills and body aches   Cold symptoms (always has allergies) Now mucous is very thick  Lot of drainage in the back of her throat  No color to it  Throat is sore - burns / better than it was  The L ear hurts   Has had headaches on and off  Some aching in sinuses   Cough- hard and it hurts in her chest  occ phlegm-not much  No wheezing   Does hurt to breathe   No n/v/d   Smell/taste - no change    She has had the pfizer covid vaccines incl booster in oct  Also flu shot in oct  She did have a covid test -went to [place] (last night)  Waiting on results   Otc: mucinex -plain  Took some naproxen   No sick contacts  husb is immunized -he has a little chronic cough/fine   Patient Active Problem List   Diagnosis Date Noted  . URI with cough and congestion 03/09/2020  . Posterior vitreous detachment of right eye 11/06/2019  . Cystoid macular edema of left eye 07/14/2019  . Branch retinal vein occlusion with macular edema of left eye 07/14/2019  . Hypertensive retinopathy of left eye 07/14/2019  . Vitreous floaters of left eye 07/14/2019  . Right foot pain 04/09/2019  . Chest discomfort 04/09/2019  . Rectal bleeding 05/20/2018  . Fatigue 05/20/2018  . Hip pain, bilateral 08/31/2017  . Vitamin B12 deficiency 09/21/2016  . Benign neoplasm of  tongue 04/11/2016  . Diabetic gastroparesis ([place]) 03/27/2016  . Retinal vein occlusion, branch 01/03/2016  . Alternating constipation and diarrhea 08/12/2015  . AVM (arteriovenous malformation) of colon 05/18/2015  . IBS (irritable bowel syndrome) 02/28/2015  . Diverticulitis of colon 10/14/2014  . Gout 07/22/2014  . Hypothyroidism 04/23/2014  . Nausea without vomiting 02/18/2014  . Encounter for Medicare annual wellness exam 01/23/2014  . Estrogen deficiency 01/23/2014  . Colon cancer screening 01/23/2014  . Tremor 01/20/2014  . Allergic rhinitis 01/07/2014  . Thrombocytopenia ([place]) 11/25/2013  . Diabetic neuropathy ([place]) 11/25/2013  . Lap Nissen October 2013 01/19/2012  . Large type III mixed hiatus hernia  12/06/2011  . Stress reaction 10/25/2011  . Routine general medical examination at a health care facility 07/11/2011  . TRANSAMINASES, SERUM, ELEVATED 11/11/2009  . History of breast cancer 08/02/2009  . DIVERTICULOSIS-COLON 03/03/2008  . ESOPHAGEAL STRICTURE 03/02/2008  . HIATAL HERNIA 03/02/2008  . PERITONEAL ADHESIONS 03/02/2008  . Anemia 02/07/2008  . Poorly controlled type 2 diabetes mellitus with neuropathy ([place]) 08/19/2007  . ANXIETY 04/15/2007  . DISORDERS, ORGANIC SLEEP NEC 08/23/2006  . SYNDROME, RESTLESS LEGS 08/23/2006  . SYNDROME, CARPAL TUNNEL 08/23/2006  . Hyperlipidemia associated with type 2 diabetes mellitus ([place]) 08/22/2006  . Essential hypertension 08/22/2006  . ALLERGIC RHINITIS 08/22/2006  . GERD 08/22/2006  . Fatty liver 08/22/2006  . History of  seizure 08/22/2006   Past Medical History:  Diagnosis Date  . Allergy    allegic rhinitis  . Anemia   . Angiodysplasia of colon   . Anxiety   . Arthritis   . Blood transfusion   . Borderline diabetic    per patient medical history form  . Breast cancer ([place]) 02/2009   breast CA L invasive ductal CA(hormone receptor positive.)  . Cataract   . Colon polyp    hyperplastic  . Colon stricture  ([place]) 01/14/2003  . Diabetes mellitus    pt's medical doctor took her off diabetic meds-blood sugars have not been a problem--and medications were causing pt's sugars to be too low  . Diverticulosis   . Esophageal stricture 02/04/2008  . GERD (gastroesophageal reflux disease)   . GI bleed   . Hepatic steatosis   . Hiatal hernia   . Hyperlipidemia   . Hypertension   . Hypothyroidism   . IBS (irritable bowel syndrome)   . Melena 01/2008   anemia transfusion  . Menopause    per patient medical history form  . Osteoporosis   . Postmenopausal hormone therapy    per patient medical history form.  Marland Kitchen RLS (restless legs syndrome)   . Salmonella 05/2000   renal effects secondary to dehydration  . Seizure disorder ([place])   . Seizures ([place])    one seizure several yrs ago --etiology unknown-no problem since   Past Surgical History:  Procedure Laterality Date  . ABDOMINAL HYSTERECTOMY  1983  . BREAST SURGERY  02/2009   breast biopsy invasive ductal CA-bilateral mastectomies  . EPIGASTRIC HERNIA REPAIR  01/02/2012   Procedure: HERNIA REPAIR EPIGASTRIC ADULT;  Surgeon: Pedro Earls, MD;  Location: [facility];  Service: General;  Laterality: N/A;  Laparoscopic Repair Paraesophageal Hernia, Nissen  . EYE SURGERY Left 07/2017   Dr. Zadie Rhine  . LAPAROSCOPIC NISSEN FUNDOPLICATION  98/26/4158   Procedure: LAPAROSCOPIC NISSEN FUNDOPLICATION;  Surgeon: Pedro Earls, MD;  Location: [facility];  Service: General;  Laterality: N/A;  . MASTECTOMY  04/2009   Bilateral for ductal carcinoma on L  . OVARY SURGERY  1986   ovarian tumor removed   Social History   Tobacco Use  . Smoking status: Never Smoker  . Smokeless tobacco: Never Used  Vaping Use  . Vaping Use: Never used  Substance Use Topics  . Alcohol use: No    Alcohol/week: 0.0 standard drinks  . Drug use: No   Family History  Problem Relation Age of Onset  . Heart attack Father   . Diabetes Father   . Esophageal cancer Father   .  Breast cancer Sister        x 2 sisters  . Diabetes Sister   . Hypertension Brother   . Diabetes Brother   . Hypertension Mother   . Stroke Mother   . Breast cancer Mother   . Hypertension Sister    Allergies  Allergen Reactions  . Arimidex [Anastrozole]     Joint pain  . Glipizide Other (See Comments)    Hypoglycemia   . Metformin And Related     diarrhea  . Penicillins Rash   Current Outpatient Medications on File Prior to Visit  Medication Sig Dispense Refill  . Alpha-Lipoic Acid 600 MG TABS Take 600 mg by mouth 2 (two) times daily. 180 tablet 3  . amLODipine (NORVASC) 5 MG tablet TAKE 1 TABLET DAILY 90 tablet 0  . aspirin EC 81 MG tablet Take 81 mg  by mouth daily.    . Dulaglutide (TRULICITY) 3 LF/8.1OF SOPN Inject 0.5 mLs (3 mg total) into the skin once a week. 6 mL 3  . empagliflozin (JARDIANCE) 25 MG TABS tablet Take 1 tablet (25 mg total) by mouth daily before breakfast. 90 tablet 1  . ezetimibe (ZETIA) 10 MG tablet TAKE 1 TABLET DAILY 90 tablet 0  . fluticasone (FLONASE) 50 MCG/ACT nasal spray Place 2 sprays into both nostrils daily as needed. Allergies 48 g 3  . hydrochlorothiazide (HYDRODIURIL) 25 MG tablet TAKE 1 TABLET DAILY 90 tablet 0  . levothyroxine (SYNTHROID) 50 MCG tablet TAKE 1 TABLET DAILY BEFORE BREAKFAST 90 tablet 0  . losartan (COZAAR) 100 MG tablet Take 1 tablet (100 mg total) by mouth daily. 90 tablet 1  . Multiple Vitamin (MULTIVITAMIN WITH MINERALS) TABS tablet Take 1 tablet by mouth daily.    Marland Kitchen omeprazole (PRILOSEC) 20 MG capsule TAKE 1 CAPSULE TWICE A DAY BEFORE MEALS 180 capsule 0  . repaglinide (PRANDIN) 1 MG tablet Take 1 tablet (1 mg total) by mouth daily before supper. 90 tablet 3  . rOPINIRole (REQUIP) 1 MG tablet TAKE 1 TABLET AT BEDTIME 90 tablet 3  . rosuvastatin (CRESTOR) 10 MG tablet Take one pill by mouth twice weekly 24 tablet 3  . sertraline (ZOLOFT) 100 MG tablet TAKE 1 TABLET AT BEDTIME 90 tablet 3  . [DISCONTINUED] cetirizine  (ZYRTEC) 10 MG tablet Take 10 mg by mouth daily as needed. For allergy symptoms     No current facility-administered medications on file prior to visit.   Review of Systems  Constitutional: Positive for malaise/fatigue. Negative for chills and fever.  HENT: Positive for congestion, ear pain, sinus pain and sore throat.   Eyes: Negative for blurred vision, discharge and redness.  Respiratory: Positive for cough and sputum production. Negative for shortness of breath, wheezing and stridor.   Cardiovascular: Negative for chest pain, palpitations and leg swelling.  Gastrointestinal: Negative for abdominal pain, diarrhea, nausea and vomiting.  Musculoskeletal: Negative for myalgias.  Skin: Negative for rash.  Neurological: Positive for headaches. Negative for dizziness.    Observations/Objective: Patient appears well, in no distress Weight is baseline  No facial swelling or asymmetry Normal voice-not hoarse and no slurred speech No obvious tremor or mobility impairment Moving neck and UEs normally Able to hear the call well  No cough or shortness of breath during interview  Talkative and mentally sharp with no cognitive changes No skin changes on face or neck , no rash or pallor Affect is normal    Assessment and Plan: Problem List Items Addressed This Visit      Respiratory   URI with cough and congestion    And low grade temp  Fully immunized for covid and flu  No known contacts  Had a covid test in [place] yesterday-pending result  Schedule drive through flu swab today  Adv rest/fluids/nasal saline mucinex if helpful  Analgesic for fever/pain  inst to update if symptoms worsen (esp if sob)-and go to ER if severe inst to call us when covid test returns Plan to follow          Follow Up Instructions: Drink fluids and rest  Nasal saline may help with congestion  Continue mucinex if helpful  Treat fever if needed If you become short of breath or symptoms suddenly  worsen let us know and go to the ER  Let us know when covid test result comes back  The office will call  you to schedule a flu test as well     I discussed the assessment and treatment plan with the patient. The patient was provided an opportunity to ask questions and all were answered. The patient agreed with the plan and demonstrated an understanding of the instructions.   The patient was advised to call back or seek an in-person evaluation if the symptoms worsen or if the condition fails to improve as anticipated.     Loura Pardon, MD

## 2020-03-10 ENCOUNTER — Encounter (INDEPENDENT_AMBULATORY_CARE_PROVIDER_SITE_OTHER): Payer: Self-pay | Admitting: Ophthalmology

## 2020-03-10 ENCOUNTER — Telehealth: Payer: Self-pay

## 2020-03-10 LAB — NOVEL CORONAVIRUS, NAA: SARS-CoV-2, NAA: DETECTED — AB

## 2020-03-10 LAB — SARS-COV-2, NAA 2 DAY TAT

## 2020-03-10 NOTE — Telephone Encounter (Signed)
Pt said she feels about the same as when she talked to Dr. Glori Bickers except she has more congestion today. Pt does want to try and get the antibody infusion. I advise pt Dr. Glori Bickers will proceed with trying to get her set up and someone from that dpt will f/u with her

## 2020-03-10 NOTE — Telephone Encounter (Signed)
Left VM requesting pt to call the office back 

## 2020-03-10 NOTE — Telephone Encounter (Signed)
Please let her know I checked in and that there is a shortage of the antibody (saving for the sicker folks),  so I will not be able to get that going  Please keep [place] posted with how she is feeling

## 2020-03-10 NOTE — Telephone Encounter (Signed)
Pos covid noted  Please see how she is feeling  Would she be interested in the myoclonal antibody infusion (if she fits criteria)?  I can message the infusion team if interested

## 2020-03-10 NOTE — Telephone Encounter (Signed)
-----   Message from Denman George, RN sent at 03/10/2020  8:59 AM EST ----- Phone call to pt. Advised pt. of COVID results; the virus was detected, and the pt. can spread the germ to other people.  Reported onset of symptoms of fatigue, sore throat nasal congestion, body aches, cough and low grade fever on 12/18.  Advised of CDC guidelines for self isolation/ ending isolation.  Advised of safe practice guidelines; frequent handwashing, wearing mask when in proximity of other members in same household, and disinfecting commonly touched surfaces, within the home environment.  Symptom Tier reviewed; encouraged to monitor for worsening symptoms, and to contact their PCP.  Advised that pt. can take OTC medication to treat the symptoms.  Be sure to follow any guidelines from PCP re: the use of OTC medications.  Advised when to seek emergency care.  Instructed to rest and hydrate well and to monitor temperature daily.  Advised to only leave the house during recommended isolation period, if it is necessary to seek medical care.  Advised the Health Dept. will be notified.  Pt. Verb. understanding of instructions.  Will route lab result to pt's. PCP.

## 2020-03-11 NOTE — Telephone Encounter (Signed)
Pt notified. Pt understands, she said she still feels bad but she can tell she's starting to improve. Pt's fever has broken, she also has gotten her taste back. Pt advise if sxs worsen at any point to let us know asap. Pt verbalized understanding.

## 2020-03-12 ENCOUNTER — Encounter: Payer: Self-pay | Admitting: Family Medicine

## 2020-03-12 ENCOUNTER — Encounter (HOSPITAL_COMMUNITY): Payer: Self-pay | Admitting: Emergency Medicine

## 2020-03-12 ENCOUNTER — Emergency Department (HOSPITAL_COMMUNITY)
Admission: EM | Admit: 2020-03-12 | Discharge: 2020-03-12 | Disposition: A | Discharge status: Home / Self Care | Payer: Medicare Other | Attending: Emergency Medicine | Admitting: Emergency Medicine

## 2020-03-12 ENCOUNTER — Emergency Department (HOSPITAL_COMMUNITY): Payer: Medicare Other

## 2020-03-12 ENCOUNTER — Other Ambulatory Visit: Payer: Self-pay

## 2020-03-12 DIAGNOSIS — U071 COVID-19: Secondary | ICD-10-CM | POA: Diagnosis not present

## 2020-03-12 DIAGNOSIS — Z85038 Personal history of other malignant neoplasm of large intestine: Secondary | ICD-10-CM | POA: Insufficient documentation

## 2020-03-12 DIAGNOSIS — Z7984 Long term (current) use of oral hypoglycemic drugs: Secondary | ICD-10-CM | POA: Diagnosis not present

## 2020-03-12 DIAGNOSIS — E039 Hypothyroidism, unspecified: Secondary | ICD-10-CM | POA: Insufficient documentation

## 2020-03-12 DIAGNOSIS — Z85810 Personal history of malignant neoplasm of tongue: Secondary | ICD-10-CM | POA: Insufficient documentation

## 2020-03-12 DIAGNOSIS — Z853 Personal history of malignant neoplasm of breast: Secondary | ICD-10-CM | POA: Insufficient documentation

## 2020-03-12 DIAGNOSIS — E1140 Type 2 diabetes mellitus with diabetic neuropathy, unspecified: Secondary | ICD-10-CM | POA: Diagnosis not present

## 2020-03-12 DIAGNOSIS — Z23 Encounter for immunization: Secondary | ICD-10-CM | POA: Insufficient documentation

## 2020-03-12 DIAGNOSIS — I10 Essential (primary) hypertension: Secondary | ICD-10-CM | POA: Diagnosis not present

## 2020-03-12 DIAGNOSIS — Z79899 Other long term (current) drug therapy: Secondary | ICD-10-CM | POA: Insufficient documentation

## 2020-03-12 DIAGNOSIS — J9811 Atelectasis: Secondary | ICD-10-CM | POA: Diagnosis not present

## 2020-03-12 MED ORDER — SODIUM CHLORIDE 0.9 % IV SOLN: 1200.0000 mg | Freq: Once | INTRAVENOUS | Status: DC

## 2020-03-12 MED ORDER — METHYLPREDNISOLONE SODIUM SUCC 125 MG IJ SOLR: 125.0000 mg | Freq: Once | INTRAMUSCULAR | Status: DC | PRN

## 2020-03-12 MED ORDER — SODIUM CHLORIDE 0.9 % IV SOLN
Freq: Once | INTRAVENOUS | Status: AC
  Filled 2020-03-12: qty 20

## 2020-03-12 MED ORDER — EPINEPHRINE 0.3 MG/0.3ML IJ SOAJ: 0.3000 mg | Freq: Once | INTRAMUSCULAR | Status: DC | PRN

## 2020-03-12 MED ORDER — DIPHENHYDRAMINE HCL 50 MG/ML IJ SOLN: 50.0000 mg | Freq: Once | INTRAMUSCULAR | Status: DC | PRN

## 2020-03-12 MED ORDER — FAMOTIDINE IN NACL 20-0.9 MG/50ML-% IV SOLN
20.0000 mg | Freq: Once | INTRAVENOUS | Status: DC | PRN
  Filled 2020-03-12: qty 50

## 2020-03-12 MED ORDER — SODIUM CHLORIDE 0.9 % IV SOLN: INTRAVENOUS | Status: DC | PRN

## 2020-03-12 MED ORDER — ALBUTEROL SULFATE HFA 108 (90 BASE) MCG/ACT IN AERS: 2.0000 | INHALATION_SPRAY | Freq: Once | RESPIRATORY_TRACT | Status: DC | PRN

## 2020-03-12 MED ORDER — BENZONATATE 100 MG PO CAPS: 100.0000 mg | ORAL_CAPSULE | Freq: Three times a day (TID) | ORAL | 0 refills | Status: DC

## 2020-03-12 NOTE — ED Provider Notes (Signed)
[facility] EMERGENCY DEPARTMENT Provider Note   CSN: [id number] Arrival date & time: 03/12/20  1428     History Chief Complaint  Patient presents with  . Covid Positive    Valerie Ball is a 74 y.o. female.  74 yo F with a chief complaints of having the novel coronavirus.  Was having symptoms last Saturday or about 6 days ago.  Was tested a couple days afterwards and was positive.  Has had lost of taste and smell cough fatigue fevers chills myalgias.  Feels like it is gotten somewhat worse over the past few days and decided to come in to inquire about the monoclonal antibody.  Has been able to eat and drink without difficulty.  No abdominal pain.  The history is provided by the patient.  Illness Severity:  Moderate Onset quality:  Gradual Duration:  6 days Timing:  Constant Progression:  Worsening Chronicity:  New Associated symptoms: congestion, cough, fatigue, fever and myalgias   Associated symptoms: no chest pain, no headaches, no nausea, no rhinorrhea, no shortness of breath, no vomiting and no wheezing        Past Medical History:  Diagnosis Date  . Allergy    allegic rhinitis  . Anemia   . Angiodysplasia of colon   . Anxiety   . Arthritis   . Blood transfusion   . Borderline diabetic    per patient medical history form  . Breast cancer ([place]) 02/2009   breast CA L invasive ductal CA(hormone receptor positive.)  . Cataract   . Colon polyp    hyperplastic  . Colon stricture ([place]) 01/14/2003  . Diabetes mellitus    pt's medical doctor took her off diabetic meds-blood sugars have not been a problem--and medications were causing pt's sugars to be too low  . Diverticulosis   . Esophageal stricture 02/04/2008  . GERD (gastroesophageal reflux disease)   . GI bleed   . Hepatic steatosis   . Hiatal hernia   . Hyperlipidemia   . Hypertension   . Hypothyroidism   . IBS (irritable bowel syndrome)   . Melena 01/2008   anemia transfusion  .  Menopause    per patient medical history form  . Osteoporosis   . Postmenopausal hormone therapy    per patient medical history form.  Marland Kitchen RLS (restless legs syndrome)   . Salmonella 05/2000   renal effects secondary to dehydration  . Seizure disorder ([place])   . Seizures ([place])    one seizure several yrs ago --etiology unknown-no problem since    Patient Active Problem List   Diagnosis Date Noted  . URI with cough and congestion 03/09/2020  . Posterior vitreous detachment of right eye 11/06/2019  . Cystoid macular edema of left eye 07/14/2019  . Branch retinal vein occlusion with macular edema of left eye 07/14/2019  . Hypertensive retinopathy of left eye 07/14/2019  . Vitreous floaters of left eye 07/14/2019  . Right foot pain 04/09/2019  . Chest discomfort 04/09/2019  . Rectal bleeding 05/20/2018  . Fatigue 05/20/2018  . Hip pain, bilateral 08/31/2017  . Vitamin B12 deficiency 09/21/2016  . Benign neoplasm of tongue 04/11/2016  . Diabetic gastroparesis ([place]) 03/27/2016  . Retinal vein occlusion, branch 01/03/2016  . Alternating constipation and diarrhea 08/12/2015  . AVM (arteriovenous malformation) of colon 05/18/2015  . IBS (irritable bowel syndrome) 02/28/2015  . Diverticulitis of colon 10/14/2014  . Gout 07/22/2014  . Hypothyroidism 04/23/2014  . Nausea without vomiting 02/18/2014  .  Encounter for Medicare annual wellness exam 01/23/2014  . Estrogen deficiency 01/23/2014  . Colon cancer screening 01/23/2014  . Tremor 01/20/2014  . Allergic rhinitis 01/07/2014  . Thrombocytopenia ([place]) 11/25/2013  . Diabetic neuropathy ([place]) 11/25/2013  . Lap Nissen October 2013 01/19/2012  . Large type III mixed hiatus hernia  12/06/2011  . Stress reaction 10/25/2011  . Routine general medical examination at a health care facility 07/11/2011  . TRANSAMINASES, SERUM, ELEVATED 11/11/2009  . History of breast cancer 08/02/2009  . DIVERTICULOSIS-COLON 03/03/2008  . ESOPHAGEAL  STRICTURE 03/02/2008  . HIATAL HERNIA 03/02/2008  . PERITONEAL ADHESIONS 03/02/2008  . Anemia 02/07/2008  . Poorly controlled type 2 diabetes mellitus with neuropathy ([place]) 08/19/2007  . ANXIETY 04/15/2007  . DISORDERS, ORGANIC SLEEP NEC 08/23/2006  . SYNDROME, RESTLESS LEGS 08/23/2006  . SYNDROME, CARPAL TUNNEL 08/23/2006  . Hyperlipidemia associated with type 2 diabetes mellitus ([place]) 08/22/2006  . Essential hypertension 08/22/2006  . ALLERGIC RHINITIS 08/22/2006  . GERD 08/22/2006  . Fatty liver 08/22/2006  . History of seizure 08/22/2006    Past Surgical History:  Procedure Laterality Date  . ABDOMINAL HYSTERECTOMY  1983  . BREAST SURGERY  02/2009   breast biopsy invasive ductal CA-bilateral mastectomies  . EPIGASTRIC HERNIA REPAIR  01/02/2012   Procedure: HERNIA REPAIR EPIGASTRIC ADULT;  Surgeon: Pedro Earls, MD;  Location: [facility];  Service: General;  Laterality: N/A;  Laparoscopic Repair Paraesophageal Hernia, Nissen  . EYE SURGERY Left 07/2017   Dr. Zadie Rhine  . LAPAROSCOPIC NISSEN FUNDOPLICATION  [id number]   Procedure: LAPAROSCOPIC NISSEN FUNDOPLICATION;  Surgeon: Pedro Earls, MD;  Location: [facility];  Service: General;  Laterality: N/A;  . MASTECTOMY  04/2009   Bilateral for ductal carcinoma on L  . OVARY SURGERY  1986   ovarian tumor removed     OB History   No obstetric history on file.     Family History  Problem Relation Age of Onset  . Heart attack Father   . Diabetes Father   . Esophageal cancer Father   . Breast cancer Sister        x 2 sisters  . Diabetes Sister   . Hypertension Brother   . Diabetes Brother   . Hypertension Mother   . Stroke Mother   . Breast cancer Mother   . Hypertension Sister     Social History   Tobacco Use  . Smoking status: Never Smoker  . Smokeless tobacco: Never Used  Vaping Use  . Vaping Use: Never used  Substance Use Topics  . Alcohol use: No    Alcohol/week: 0.0 standard drinks  . Drug use: No     Home Medications Prior to Admission medications   Medication Sig Start Date End Date Taking? Authorizing Provider  Alpha-Lipoic Acid 600 MG TABS Take 600 mg by mouth 2 (two) times daily. 05/09/19  Yes Philemon Kingdom, MD  amLODipine (NORVASC) 5 MG tablet TAKE 1 TABLET DAILY Patient taking differently: Take 5 mg by mouth at bedtime. 01/20/20  Yes Tower, Wynelle Fanny, MD  Dulaglutide (TRULICITY) 3 0000000 SOPN Inject 0.5 mLs (3 mg total) into the skin once a week. Patient taking differently: Inject 3 mg into the skin every Saturday. 11/26/19  Yes Philemon Kingdom, MD  empagliflozin (JARDIANCE) 25 MG TABS tablet Take 1 tablet (25 mg total) by mouth daily before breakfast. 09/23/19  Yes Philemon Kingdom, MD  ezetimibe (ZETIA) 10 MG tablet TAKE 1 TABLET DAILY Patient taking differently: Take 10 mg by mouth daily.  01/12/20  Yes Tower, Wynelle Fanny, MD  fluticasone (FLONASE) 50 MCG/ACT nasal spray Place 2 sprays into both nostrils daily as needed. Allergies Patient taking differently: Place 2 sprays into both nostrils daily as needed for allergies. 08/31/17  Yes Tower, Wynelle Fanny, MD  hydrochlorothiazide (HYDRODIURIL) 25 MG tablet TAKE 1 TABLET DAILY Patient taking differently: Take 25 mg by mouth daily. 01/12/20  Yes Tower, Wynelle Fanny, MD  levothyroxine (SYNTHROID) 50 MCG tablet TAKE 1 TABLET DAILY BEFORE BREAKFAST Patient taking differently: Take 50 mcg by mouth daily before breakfast. 01/12/20  Yes Tower, Wynelle Fanny, MD  losartan (COZAAR) 100 MG tablet Take 1 tablet (100 mg total) by mouth daily. 08/06/18  Yes Tower, Wynelle Fanny, MD  Multiple Vitamin (MULTIVITAMIN WITH MINERALS) TABS tablet Take 1 tablet by mouth daily.   Yes [provider]  naproxen sodium (ALEVE) 220 MG tablet Take 220 mg by mouth daily as needed (pain).   Yes [provider]  omeprazole (PRILOSEC) 20 MG capsule TAKE 1 CAPSULE TWICE A DAY BEFORE MEALS Patient taking differently: Take 20 mg by mouth daily. 09/23/19  Yes Tower, Wynelle Fanny, MD  repaglinide (PRANDIN) 1 MG tablet Take 1 tablet (1 mg total) by mouth daily before supper. 01/08/20  Yes Philemon Kingdom, MD  rOPINIRole (REQUIP) 1 MG tablet TAKE 1 TABLET AT BEDTIME Patient taking differently: Take 1 mg by mouth at bedtime. 04/16/19  Yes Tower, Wynelle Fanny, MD  rosuvastatin (CRESTOR) 10 MG tablet Take one pill by mouth twice weekly Patient taking differently: Take 10 mg by mouth See admin instructions. Takes 1 tablet (10 mg totally) by mouth 2 times weekly 05/12/19  Yes Tower, Roque Lias A, MD  sertraline (ZOLOFT) 100 MG tablet TAKE 1 TABLET AT BEDTIME Patient taking differently: Take 100 mg by mouth daily. 04/16/19  Yes Tower, Wynelle Fanny, MD  tetrahydrozoline-zinc (VISINE-AC) 0.05-0.25 % ophthalmic solution Place 2 drops into both eyes 3 (three) times daily as needed (allergy eyes).   Yes [provider]  benzonatate (TESSALON) 100 MG capsule Take 1 capsule (100 mg total) by mouth every 8 (eight) hours. 03/12/20   Deno Etienne, DO  cetirizine (ZYRTEC) 10 MG tablet Take 10 mg by mouth daily as needed. For allergy symptoms  06/08/11  [provider]    Allergies    Arimidex [anastrozole], Glipizide, Metformin and related, and Penicillins  Review of Systems   Review of Systems  Constitutional: Positive for chills, fatigue and fever.  HENT: Positive for congestion. Negative for rhinorrhea.   Eyes: Negative for redness and visual disturbance.  Respiratory: Positive for cough. Negative for shortness of breath and wheezing.   Cardiovascular: Negative for chest pain and palpitations.  Gastrointestinal: Negative for nausea and vomiting.  Genitourinary: Negative for dysuria and urgency.  Musculoskeletal: Positive for myalgias. Negative for arthralgias.  Skin: Negative for pallor and wound.  Neurological: Negative for dizziness and headaches.    Physical Exam Updated Vital Signs BP (!) 110/96   Pulse 87   Temp 98.4 F (36.9 C) (Oral)   Resp 18   Ht 5' 3.5" (1.613  m)   Wt 61.2 kg   LMP 03/20/1981   SpO2 95%   BMI 23.54 kg/m   Physical Exam Vitals and nursing note reviewed.  Constitutional:      General: She is not in acute distress.    Appearance: She is well-developed and well-nourished. She is not diaphoretic.  HENT:     Head: Normocephalic and atraumatic.  Eyes:  Extraocular Movements: EOM normal.     Pupils: Pupils are equal, round, and reactive to light.  Cardiovascular:     Rate and Rhythm: Normal rate and regular rhythm.     Heart sounds: No murmur heard. No friction rub. No gallop.   Pulmonary:     Effort: Pulmonary effort is normal.     Breath sounds: No wheezing or rales.     Comments: Coarse breath sounds in all fields. Abdominal:     General: There is no distension.     Palpations: Abdomen is soft.     Tenderness: There is no abdominal tenderness.  Musculoskeletal:        General: No tenderness or edema.     Cervical back: Normal range of motion and neck supple.  Skin:    General: Skin is warm and dry.  Neurological:     Mental Status: She is alert and oriented to person, place, and time.  Psychiatric:        Mood and Affect: Mood and affect normal.        Behavior: Behavior normal.     ED Results / Procedures / Treatments   Labs (all labs ordered are listed, but only abnormal results are displayed) Labs Reviewed - No data to display  EKG None  Radiology DG Chest Portable 1 View  Result Date: 03/12/2020 CLINICAL DATA:  Recent diagnosis of COVID-19 positive, nasal congestion, headache, body aches, and shortness of breath which have not improved, worsened today EXAM: PORTABLE CHEST 1 VIEW COMPARISON:  Portable exam 1516 hours compared to 08/27/2017 FINDINGS: Normal heart size, mediastinal contours, and pulmonary vascularity. Mild chronic elevation of LEFT diaphragm. Minimal LEFT basilar atelectasis. Lungs otherwise clear. No infiltrate, pleural effusion or pneumothorax. Atherosclerotic calcification at aortic  arch. No acute osseous findings. Surgical clips LEFT axilla. IMPRESSION: Minimal LEFT basilar atelectasis. Aortic Atherosclerosis (ICD10-I70.0). Electronically Signed   By: Lavonia Dana M.D.   On: 03/12/2020 15:24    Procedures Procedures (including critical care time)  Medications Ordered in ED Medications  0.9 %  sodium chloride infusion (has no administration in time range)  diphenhydrAMINE (BENADRYL) injection 50 mg (has no administration in time range)  famotidine (PEPCID) IVPB 20 mg premix (has no administration in time range)  methylPREDNISolone sodium succinate (SOLU-MEDROL) 125 mg/2 mL injection 125 mg (has no administration in time range)  albuterol (VENTOLIN HFA) 108 (90 Base) MCG/ACT inhaler 2 puff (has no administration in time range)  EPINEPHrine (EPI-PEN) injection 0.3 mg (has no administration in time range)  bamlanivimab 700 mg, etesevimab 1,400 mg in sodium chloride 0.9 % 160 mL IVPB ( Intravenous New Bag/Given 03/12/20 2000)    ED Course  I have reviewed the triage vital signs and the nursing notes.  Pertinent labs & imaging results that were available during my care of the patient were reviewed by me and considered in my medical decision making (see chart for details).    MDM Rules/Calculators/A&P                          74 yo F with a cc of cough congestion fevers chills myalgias shortness of breath.  Going on for about 6 days now.  Patient has been progressively worsening at home.  Requesting possible monoclonal antibody infusion.  Will ambulate on pulse ox initially.  Able to ambulate without hypoxia.  Will give the monoclonal antibody infusion here as I feel her chances of getting this as an outpatient  to her unlikely and she is already on day 6 of illness.  Given the monoclonal antibody infusion without issue. Will discharge home. PCP follow-up.  9:32 PM:  I have discussed the diagnosis/risks/treatment options with the patient and believe the pt to be eligible  for discharge home to follow-up with PCP, covid clinic. We also discussed returning to the ED immediately if new or worsening sx occur. We discussed the sx which are most concerning (e.g., sudden worsening sob, fever, inability to tolerate by mouth) that necessitate immediate return. Medications administered to the patient during their visit and any new prescriptions provided to the patient are listed below.  Medications given during this visit Medications  0.9 %  sodium chloride infusion (has no administration in time range)  diphenhydrAMINE (BENADRYL) injection 50 mg (has no administration in time range)  famotidine (PEPCID) IVPB 20 mg premix (has no administration in time range)  methylPREDNISolone sodium succinate (SOLU-MEDROL) 125 mg/2 mL injection 125 mg (has no administration in time range)  albuterol (VENTOLIN HFA) 108 (90 Base) MCG/ACT inhaler 2 puff (has no administration in time range)  EPINEPHrine (EPI-PEN) injection 0.3 mg (has no administration in time range)  bamlanivimab 700 mg, etesevimab 1,400 mg in sodium chloride 0.9 % 160 mL IVPB ( Intravenous New Bag/Given 03/12/20 2000)     The patient appears reasonably screen and/or stabilized for discharge and I doubt any other medical condition or other [facility] requiring further screening, evaluation, or treatment in the ED at this time prior to discharge.   Final Clinical Impression(s) / ED Diagnoses Final diagnoses:  [id number] virus infection    Rx / DC Orders ED Discharge Orders         Ordered    benzonatate (TESSALON) 100 MG capsule  Every 8 hours        03/12/20 2131           Deno Etienne, DO 03/12/20 2133

## 2020-03-12 NOTE — ED Notes (Signed)
PO fluids provided to patient per her request tolerating well. Sophia, RN made aware of interventions performed by this RN.

## 2020-03-12 NOTE — ED Notes (Signed)
Pt ambulated well O2 stayed between 94-96%

## 2020-03-12 NOTE — ED Notes (Signed)
E-signature pad unavailable at time of pt discharge. This RN discussed discharge materials with pt and answered all pt questions. Pt stated understanding of discharge material. ? ?

## 2020-03-12 NOTE — ED Triage Notes (Signed)
Pt arrives to ED and reports recent COVID positive test on Wednesday 12/22. Pt states her nasal congestion, headache, body aches and SOB has not gotten better. Pt states she feels worse today than yesterday. Fully vacciniated. 100% SpO2 on RA in triage.

## 2020-03-12 NOTE — Discharge Instructions (Signed)
Take tylenol 2 pills 4 times a day and motrin 4 pills 3 times a day.  Drink plenty of fluids.  Return for worsening shortness of breath, headache, confusion. Follow up with your family doctor.   

## 2020-03-13 ENCOUNTER — Encounter: Payer: Self-pay | Admitting: Family Medicine

## 2020-03-15 ENCOUNTER — Other Ambulatory Visit: Payer: Self-pay | Admitting: Family Medicine

## 2020-03-18 ENCOUNTER — Encounter (INDEPENDENT_AMBULATORY_CARE_PROVIDER_SITE_OTHER): Payer: Medicare Other | Admitting: Ophthalmology

## 2020-03-22 ENCOUNTER — Ambulatory Visit (INDEPENDENT_AMBULATORY_CARE_PROVIDER_SITE_OTHER): Payer: Medicare Other | Admitting: Ophthalmology

## 2020-03-22 ENCOUNTER — Other Ambulatory Visit: Payer: Self-pay

## 2020-03-22 ENCOUNTER — Encounter (INDEPENDENT_AMBULATORY_CARE_PROVIDER_SITE_OTHER): Payer: Self-pay | Admitting: Ophthalmology

## 2020-03-22 DIAGNOSIS — H34832 Tributary (branch) retinal vein occlusion, left eye, with macular edema: Secondary | ICD-10-CM | POA: Diagnosis not present

## 2020-03-22 MED ORDER — AFLIBERCEPT 2MG/0.05ML IZ SOLN FOR KALEIDOSCOPE
2.0000 mg | INTRAVITREAL | Status: AC | PRN
  Administered 2020-03-22: 2 mg via INTRAVITREAL

## 2020-03-22 NOTE — Assessment & Plan Note (Signed)
OS vastly improved, currently at 8-week follow-up after intravitreal Eylea, repeat injection today and examination in 10 weeks

## 2020-03-22 NOTE — Progress Notes (Signed)
03/22/2020     CHIEF COMPLAINT Patient presents for Retina Follow Up (8.5 Week BRVO f\u OS. Possible Eylea OS. OCT/Pt states vision is stable. Pt went into quarantine for COVID 03/06/20. Pt states while she had COVID she noticed OU were swollen)   HISTORY OF PRESENT ILLNESS: Valerie Ball is a 75 y.o. female who presents to the clinic today for:   HPI    Retina Follow Up    Patient presents with  CRVO/BRVO.  In left eye.  Duration of 8.5 weeks.  Since onset it is stable.  I, the attending physician,  performed the HPI with the patient and updated documentation appropriately. Additional comments: 8.5 Week BRVO f\u OS. Possible Eylea OS. OCT Pt states vision is stable. Pt went into quarantine for COVID 03/06/20. Pt states while she had COVID she noticed OU were swollen       Last edited by Tilda Franco on 03/22/2020  9:29 AM. (History)      Referring physician: Tower, Wynelle Fanny, MD [place],  [place] [address]  HISTORICAL INFORMATION:   Selected notes from the MEDICAL RECORD NUMBER    Lab Results  Component Value Date   HGBA1C 7.3 (A) 01/08/2020     CURRENT MEDICATIONS: Current Outpatient Medications (Ophthalmic Drugs)  Medication Sig  . tetrahydrozoline-zinc (VISINE-AC) 0.05-0.25 % ophthalmic solution Place 2 drops into both eyes 3 (three) times daily as needed (allergy eyes).   No current facility-administered medications for this visit. (Ophthalmic Drugs)   Current Outpatient Medications (Other)  Medication Sig  . Alpha-Lipoic Acid 600 MG TABS Take 600 mg by mouth 2 (two) times daily.  Marland Kitchen amLODipine (NORVASC) 5 MG tablet TAKE 1 TABLET DAILY (Patient taking differently: Take 5 mg by mouth at bedtime.)  . benzonatate (TESSALON) 100 MG capsule Take 1 capsule (100 mg total) by mouth every 8 (eight) hours.  . Dulaglutide (TRULICITY) 3 0000000 SOPN Inject 0.5 mLs (3 mg total) into the skin once a week. (Patient taking differently: Inject 3 mg into the  skin every Saturday.)  . empagliflozin (JARDIANCE) 25 MG TABS tablet Take 1 tablet (25 mg total) by mouth daily before breakfast.  . ezetimibe (ZETIA) 10 MG tablet TAKE 1 TABLET DAILY (Patient taking differently: Take 10 mg by mouth daily.)  . fluticasone (FLONASE) 50 MCG/ACT nasal spray Place 2 sprays into both nostrils daily as needed. Allergies (Patient taking differently: Place 2 sprays into both nostrils daily as needed for allergies.)  . hydrochlorothiazide (HYDRODIURIL) 25 MG tablet TAKE 1 TABLET DAILY (Patient taking differently: Take 25 mg by mouth daily.)  . levothyroxine (SYNTHROID) 50 MCG tablet TAKE 1 TABLET DAILY BEFORE BREAKFAST (Patient taking differently: Take 50 mcg by mouth daily before breakfast.)  . losartan (COZAAR) 100 MG tablet Take 1 tablet (100 mg total) by mouth daily.  . Multiple Vitamin (MULTIVITAMIN WITH MINERALS) TABS tablet Take 1 tablet by mouth daily.  . naproxen sodium (ALEVE) 220 MG tablet Take 220 mg by mouth daily as needed (pain).  Marland Kitchen omeprazole (PRILOSEC) 20 MG capsule TAKE 1 CAPSULE TWICE A DAY BEFORE MEALS (Patient taking differently: Take 20 mg by mouth daily.)  . repaglinide (PRANDIN) 1 MG tablet Take 1 tablet (1 mg total) by mouth daily before supper.  Marland Kitchen rOPINIRole (REQUIP) 1 MG tablet TAKE 1 TABLET AT BEDTIME (Patient taking differently: Take 1 mg by mouth at bedtime.)  . rosuvastatin (CRESTOR) 10 MG tablet TAKE 1 TABLET TWICE A WEEK  . sertraline (  ZOLOFT) 100 MG tablet TAKE 1 TABLET AT BEDTIME (Patient taking differently: Take 100 mg by mouth daily.)   No current facility-administered medications for this visit. (Other)      REVIEW OF SYSTEMS:    ALLERGIES Allergies  Allergen Reactions  . Arimidex [Anastrozole] Other (See Comments)    Joint pain  . Glipizide Other (See Comments)    Hypoglycemia   . Metformin And Related Other (See Comments)    diarrhea  . Penicillins Hives and Rash    PAST MEDICAL HISTORY Past Medical History:   Diagnosis Date  . Allergy    allegic rhinitis  . Anemia   . Angiodysplasia of colon   . Anxiety   . Arthritis   . Blood transfusion   . Borderline diabetic    per patient medical history form  . Breast cancer (HCC) 02/2009   breast CA L invasive ductal CA(hormone receptor positive.)  . Cataract   . Colon polyp    hyperplastic  . Colon stricture (HCC) 01/14/2003  . Diabetes mellitus    pt's medical doctor took her off diabetic meds-blood sugars have not been a problem--and medications were causing pt's sugars to be too low  . Diverticulosis   . Esophageal stricture 02/04/2008  . GERD (gastroesophageal reflux disease)   . GI bleed   . Hepatic steatosis   . Hiatal hernia   . Hyperlipidemia   . Hypertension   . Hypothyroidism   . IBS (irritable bowel syndrome)   . Melena 01/2008   anemia transfusion  . Menopause    per patient medical history form  . Osteoporosis   . Postmenopausal hormone therapy    per patient medical history form.  Marland Kitchen RLS (restless legs syndrome)   . Salmonella 05/2000   renal effects secondary to dehydration  . Seizure disorder (HCC)   . Seizures (HCC)    one seizure several yrs ago --etiology unknown-no problem since   Past Surgical History:  Procedure Laterality Date  . ABDOMINAL HYSTERECTOMY  1983  . BREAST SURGERY  02/2009   breast biopsy invasive ductal CA-bilateral mastectomies  . EPIGASTRIC HERNIA REPAIR  01/02/2012   Procedure: HERNIA REPAIR EPIGASTRIC ADULT;  Surgeon: [name], MD;  Location: [facility];  Service: General;  Laterality: N/A;  Laparoscopic Repair Paraesophageal Hernia, Nissen  . EYE SURGERY Left 07/2017   Dr. Luciana Axe  . LAPAROSCOPIC NISSEN FUNDOPLICATION  01/02/2012   Procedure: LAPAROSCOPIC NISSEN FUNDOPLICATION;  Surgeon: [name], MD;  Location: [facility];  Service: General;  Laterality: N/A;  . MASTECTOMY  04/2009   Bilateral for ductal carcinoma on L  . OVARY SURGERY  1986   ovarian tumor removed     FAMILY HISTORY Family History  Problem Relation Age of Onset  . Heart attack Father   . Diabetes Father   . Esophageal cancer Father   . Breast cancer Sister        x 2 sisters  . Diabetes Sister   . Hypertension Brother   . Diabetes Brother   . Hypertension Mother   . Stroke Mother   . Breast cancer Mother   . Hypertension Sister     SOCIAL HISTORY Social History   Tobacco Use  . Smoking status: Never Smoker  . Smokeless tobacco: Never Used  Vaping Use  . Vaping Use: Never used  Substance Use Topics  . Alcohol use: No    Alcohol/week: 0.0 standard drinks  . Drug use: No  OPHTHALMIC EXAM: Base Eye Exam    Visual Acuity (Snellen - Linear)      Right Left   Dist [place] 20/40 -2 20/25   Dist ph [employer] 20/25        Tonometry (Tonopen, 9:34 AM)      Right Left   Pressure 13 12       Pupils      Pupils Dark Light Shape React APD   Right PERRL 3 2 Round Brisk None   Left PERRL 3 2 Round Brisk None       Visual Fields (Counting fingers)      Left Right    Full Full       Neuro/Psych    Oriented x3: Yes   Mood/Affect: Normal       Dilation    Left eye: 1.0% Mydriacyl, 2.5% Phenylephrine @ 9:34 AM        Slit Lamp and Fundus Exam    External Exam      Right Left   External Normal Normal       Slit Lamp Exam      Right Left   Lids/Lashes Normal Normal   Conjunctiva/Sclera White and quiet White and quiet   Cornea Clear Clear   Anterior Chamber Deep and quiet Deep and quiet   Iris Round and reactive Round and reactive   Lens Posterior chamber intraocular lens Posterior chamber intraocular lens   Anterior Vitreous Normal Normal       Fundus Exam      Right Left   Posterior Vitreous  Central vitreous floaters   Disc  Normal, collaterals on nerve   C/D Ratio  0.55   Macula  Good Focal laser, Microaneurysms, no macular thickening, no clinically significant macular edema   Vessels  Macular branch retinal vein occlusion superotemporal.    Periphery  Normal          IMAGING AND PROCEDURES  Imaging and Procedures for 03/22/20  OCT, Retina - OU - Both Eyes       Right Eye Quality was good. Scan locations included subfoveal, temporal. Central Foveal Thickness: 254. Progression has been stable. Findings include normal foveal contour.   Left Eye Quality was good. Scan locations included subfoveal. Central Foveal Thickness: 234. Progression has improved. Findings include abnormal foveal contour.   Notes Much less retinal thickening superotemporal from branch retinal vein occlusion, CME vastly improved temporally, currently at 8-week follow-up.       Intravitreal Injection, Pharmacologic Agent - OS - Left Eye       Time Out 03/22/2020. 10:17 AM. Confirmed correct patient, procedure, site, and patient consented.   Anesthesia Topical anesthesia was used. Anesthetic medications included Akten 3.5%.   Procedure Preparation included Ofloxacin , 10% betadine to eyelids, 5% betadine to ocular surface, Tobramycin 0.3%. A 30 gauge needle was used.   Injection:  2 mg aflibercept Alfonse Flavors) SOLN   NDC: [id number], Lot: [id number]   Route: Intravitreal, Site: Left Eye, Waste: 0 mg  Post-op Post injection exam found visual acuity of at least counting fingers. The patient tolerated the procedure well. There were no complications. The patient received written and verbal post procedure care education. Post injection medications were not given.                 ASSESSMENT/PLAN:  Branch retinal vein occlusion with macular edema of left eye OS vastly improved, currently at 8-week follow-up after intravitreal Eylea, repeat injection today and examination in 10 weeks  ICD-10-CM   1. Branch retinal vein occlusion with macular edema of left eye  H34.8320 OCT, Retina - OU - Both Eyes    Intravitreal Injection, Pharmacologic Agent - OS - Left Eye    aflibercept (EYLEA) SOLN 2 mg    1.  OS vastly improved and maintain  less CME at 8-week follow-up post intravitreal Eylea.  We will repeat injection today and extend interval of examination to 10 weeks  2.  3.  Ophthalmic Meds Ordered this visit:  Meds ordered this encounter  Medications  . aflibercept (EYLEA) SOLN 2 mg       Return in about 10 weeks (around 05/31/2020) for DILATE OU, EYLEA OCT, OS.  There are no Patient Instructions on file for this visit.   Explained the diagnoses, plan, and follow up with the patient and they expressed understanding.  Patient expressed understanding of the importance of proper follow up care.   Clent Demark Hughie Melroy M.D. Diseases & Surgery of the Retina and Vitreous [facility] [place] 03/22/20     Abbreviations: M myopia (nearsighted); A astigmatism; H hyperopia (farsighted); P presbyopia; Mrx spectacle prescription;  CTL contact lenses; OD right eye; OS left eye; OU both eyes  XT exotropia; ET esotropia; PEK punctate epithelial keratitis; PEE punctate epithelial erosions; DES dry eye syndrome; MGD meibomian gland dysfunction; ATs artificial tears; PFAT's preservative free artificial tears; [place] nuclear sclerotic cataract; PSC posterior subcapsular cataract; ERM epi-retinal membrane; PVD posterior vitreous detachment; RD retinal detachment; DM diabetes mellitus; DR diabetic retinopathy; NPDR non-proliferative diabetic retinopathy; PDR proliferative diabetic retinopathy; CSME clinically significant macular edema; DME diabetic macular edema; dbh dot blot hemorrhages; CWS cotton wool spot; POAG primary open angle glaucoma; C/D cup-to-disc ratio; HVF humphrey visual field; GVF goldmann visual field; OCT optical coherence tomography; IOP intraocular pressure; BRVO Branch retinal vein occlusion; CRVO central retinal vein occlusion; CRAO central retinal artery occlusion; BRAO branch retinal artery occlusion; RT retinal tear; SB scleral buckle; PPV pars plana vitrectomy; VH Vitreous hemorrhage; PRP panretinal laser  photocoagulation; IVK intravitreal kenalog; VMT vitreomacular traction; MH Macular hole;  NVD neovascularization of the disc; NVE neovascularization elsewhere; AREDS age related eye disease study; ARMD age related macular degeneration; POAG primary open angle glaucoma; EBMD epithelial/anterior basement membrane dystrophy; ACIOL anterior chamber intraocular lens; IOL intraocular lens; PCIOL posterior chamber intraocular lens; Phaco/IOL phacoemulsification with intraocular lens placement; [place] photorefractive keratectomy; LASIK laser assisted in situ keratomileusis; HTN hypertension; DM diabetes mellitus; COPD chronic obstructive pulmonary disease

## 2020-03-25 ENCOUNTER — Telehealth: Payer: Self-pay | Admitting: Family Medicine

## 2020-03-25 NOTE — Telephone Encounter (Signed)
Unsure of any call?

## 2020-03-25 NOTE — Telephone Encounter (Signed)
Patient states that she is returning a call to you. Didn't see a note. Please call her back if needed. EM

## 2020-04-01 ENCOUNTER — Encounter: Payer: Self-pay | Admitting: Family Medicine

## 2020-04-01 ENCOUNTER — Ambulatory Visit (INDEPENDENT_AMBULATORY_CARE_PROVIDER_SITE_OTHER): Payer: Medicare Other | Admitting: Family Medicine

## 2020-04-01 ENCOUNTER — Other Ambulatory Visit: Payer: Self-pay

## 2020-04-01 VITALS — BP 114/72 | HR 86 | Temp 97.1°F | Ht 63.0 in | Wt 139.4 lb

## 2020-04-01 DIAGNOSIS — E1140 Type 2 diabetes mellitus with diabetic neuropathy, unspecified: Secondary | ICD-10-CM | POA: Diagnosis not present

## 2020-04-01 DIAGNOSIS — R7401 Elevation of levels of liver transaminase levels: Secondary | ICD-10-CM

## 2020-04-01 DIAGNOSIS — E1169 Type 2 diabetes mellitus with other specified complication: Secondary | ICD-10-CM

## 2020-04-01 DIAGNOSIS — D649 Anemia, unspecified: Secondary | ICD-10-CM

## 2020-04-01 DIAGNOSIS — I10 Essential (primary) hypertension: Secondary | ICD-10-CM | POA: Diagnosis not present

## 2020-04-01 DIAGNOSIS — E785 Hyperlipidemia, unspecified: Secondary | ICD-10-CM

## 2020-04-01 DIAGNOSIS — Z0000 Encounter for general adult medical examination without abnormal findings: Secondary | ICD-10-CM | POA: Diagnosis not present

## 2020-04-01 DIAGNOSIS — R7402 Elevation of levels of lactic acid dehydrogenase (LDH): Secondary | ICD-10-CM

## 2020-04-01 DIAGNOSIS — E039 Hypothyroidism, unspecified: Secondary | ICD-10-CM

## 2020-04-01 DIAGNOSIS — D696 Thrombocytopenia, unspecified: Secondary | ICD-10-CM

## 2020-04-01 DIAGNOSIS — E1142 Type 2 diabetes mellitus with diabetic polyneuropathy: Secondary | ICD-10-CM

## 2020-04-01 DIAGNOSIS — F411 Generalized anxiety disorder: Secondary | ICD-10-CM

## 2020-04-01 DIAGNOSIS — E538 Deficiency of other specified B group vitamins: Secondary | ICD-10-CM

## 2020-04-01 DIAGNOSIS — E1165 Type 2 diabetes mellitus with hyperglycemia: Secondary | ICD-10-CM

## 2020-04-01 MED ORDER — AMLODIPINE BESYLATE 5 MG PO TABS: 5.0000 mg | ORAL_TABLET | Freq: Every day | ORAL | 3 refills | Status: DC

## 2020-04-01 MED ORDER — OMEPRAZOLE 20 MG PO CPDR: 20.0000 mg | DELAYED_RELEASE_CAPSULE | Freq: Every day | ORAL | 3 refills | Status: DC

## 2020-04-01 MED ORDER — LEVOTHYROXINE SODIUM 50 MCG PO TABS: ORAL_TABLET | ORAL | 3 refills | Status: DC

## 2020-04-01 MED ORDER — LOSARTAN POTASSIUM 100 MG PO TABS
100.0000 mg | ORAL_TABLET | Freq: Every day | ORAL | 3 refills | Status: DC
Start: 2020-04-01 — End: 2021-03-10

## 2020-04-01 MED ORDER — FLUTICASONE PROPIONATE 50 MCG/ACT NA SUSP: 2.0000 | Freq: Every day | NASAL | 3 refills | Status: DC | PRN

## 2020-04-01 MED ORDER — HYDROCHLOROTHIAZIDE 25 MG PO TABS: 25.0000 mg | ORAL_TABLET | Freq: Every day | ORAL | 3 refills | Status: DC

## 2020-04-01 MED ORDER — ROSUVASTATIN CALCIUM 10 MG PO TABS: ORAL_TABLET | ORAL | 3 refills | Status: DC

## 2020-04-01 MED ORDER — SERTRALINE HCL 100 MG PO TABS: 100.0000 mg | ORAL_TABLET | Freq: Every day | ORAL | 3 refills | Status: DC

## 2020-04-01 NOTE — Assessment & Plan Note (Signed)
Lab Results  Component Value Date   VITAMINB12 335 03/05/2020   Continues oral supplementation

## 2020-04-01 NOTE — Assessment & Plan Note (Signed)
bp in fair control at this time  BP Readings from Last 1 Encounters:  04/01/20 114/72   No changes needed Most recent labs reviewed  Disc lifstyle change with low sodium diet and exercise  Plan to continue amlodipine 5 mg daily  hctz 25 mg daily  Losartan 100 mg daily

## 2020-04-01 NOTE — Assessment & Plan Note (Signed)
Platelet ct of 95 No symptoms Nl ferritin and Hb today  Will continue to watch - plan re check in about 3 months

## 2020-04-01 NOTE — Progress Notes (Signed)
Subjective:    Patient ID: Valerie Ball, female    DOB: April 20, 1945, 75 y.o.   MRN: [id number]  This visit occurred during the SARS-CoV-2 public health emergency.  Safety protocols were in place, including screening questions prior to the visit, additional usage of staff PPE, and extensive cleaning of exam room while observing appropriate contact time as indicated for disinfecting solutions.    HPI Here for health maintenance exam and to review chronic medical problems    Had amw on 12/17   Wt Readings from Last 3 Encounters:  04/01/20 139 lb 7 oz (63.2 kg)  03/12/20 135 lb (61.2 kg)  03/09/20 135 lb (61.2 kg)  has lost wt before she got sick  24.70 kg/m   covid immunized with booster  Had covid in December and rec monoclonal ab infusion   Taking care of herself best she can do  Mostly resting right now-still tired   No regular exercise right now    Mammogram  (mastectomy) -no longer gets them  Self exam  colonscopy 1/16 -hyperplastic polyp  Gi wise doing better   dexa 12/15 -nl  Falls- none Fractures -none Supplements -mvi  HTN bp is stable today  No cp or palpitations or headaches or edema  No side effects to medicines  BP Readings from Last 3 Encounters:  04/01/20 114/72  03/12/20 130/86  03/09/20 125/83     norvasc 5 mg daily  hctz 25 mg daily  Losartan 100 mg daily  DM2 Sees Dr Elvera Lennox  Improved per pt - a1c was in low 7 range   Eye care- has had vein occlusion and retinopathy  A little better  Can still drive   Hypothyroid Hypothyroidism  Pt has no clinical changes No change in energy level/ hair or skin/ edema and no tremor Lab Results  Component Value Date   TSH 2.53 03/05/2020    Levothyroxine 50 mcg   Hyperlipidemia Lab Results  Component Value Date   CHOL 96 03/05/2020   CHOL 102 05/21/2019   CHOL 165 03/31/2019   Lab Results  Component Value Date   HDL 31.00 (L) 03/05/2020   HDL 27.00 (L) 05/21/2019   HDL 33.70 (L)  03/31/2019   Lab Results  Component Value Date   LDLCALC 31 03/05/2020   Lab Results  Component Value Date   TRIG 166.0 (H) 03/05/2020   TRIG 246.0 (H) 05/21/2019   TRIG (H) 03/31/2019    439.0 Triglyceride is over 400; calculations on Lipids are invalid.   Lab Results  Component Value Date   CHOLHDL 3 03/05/2020   CHOLHDL 4 05/21/2019   CHOLHDL 5 03/31/2019   Lab Results  Component Value Date   LDLDIRECT 48.0 05/21/2019   LDLDIRECT 83.0 03/31/2019   LDLDIRECT 105.0 09/27/2018  takes crestor twice weekly  Trig are down this draw  Diet is fair -just had holidays / trying to be good   Lab Results  Component Value Date   WBC 4.5 03/05/2020   HGB 13.4 03/05/2020   HCT 40.5 03/05/2020   MCV 80.2 03/05/2020   PLT 95.0 (L) 03/05/2020   Lab Results  Component Value Date   FERRITIN 20.5 03/05/2020   B12 def Lab Results  Component Value Date   VITAMINB12 335 03/05/2020  takes B12 oral every other day   Transaminases H/o fatty liver Lab Results  Component Value Date   ALT 18 03/05/2020   AST 32 03/05/2020   ALKPHOS 77 03/05/2020   BILITOT 0.5  03/05/2020    Patient Active Problem List   Diagnosis Date Noted  . Posterior vitreous detachment of right eye 11/06/2019  . Cystoid macular edema of left eye 07/14/2019  . Branch retinal vein occlusion with macular edema of left eye 07/14/2019  . Hypertensive retinopathy of left eye 07/14/2019  . Vitreous floaters of left eye 07/14/2019  . Right foot pain 04/09/2019  . Rectal bleeding 05/20/2018  . Fatigue 05/20/2018  . Hip pain, bilateral 08/31/2017  . Vitamin B12 deficiency 09/21/2016  . Benign neoplasm of tongue 04/11/2016  . Diabetic gastroparesis ([place]) 03/27/2016  . Retinal vein occlusion, branch 01/03/2016  . Alternating constipation and diarrhea 08/12/2015  . AVM (arteriovenous malformation) of colon 05/18/2015  . IBS (irritable bowel syndrome) 02/28/2015  . Diverticulitis of colon 10/14/2014  . Gout  07/22/2014  . Hypothyroidism 04/23/2014  . Nausea without vomiting 02/18/2014  . Encounter for Medicare annual wellness exam 01/23/2014  . Estrogen deficiency 01/23/2014  . Colon cancer screening 01/23/2014  . Tremor 01/20/2014  . Allergic rhinitis 01/07/2014  . Thrombocytopenia ([place]) 11/25/2013  . Diabetic neuropathy ([place]) 11/25/2013  . Lap Nissen October 2013 01/19/2012  . Large type III mixed hiatus hernia  12/06/2011  . Stress reaction 10/25/2011  . Routine general medical examination at a health care facility 07/11/2011  . TRANSAMINASES, SERUM, ELEVATED 11/11/2009  . History of breast cancer 08/02/2009  . DIVERTICULOSIS-COLON 03/03/2008  . ESOPHAGEAL STRICTURE 03/02/2008  . HIATAL HERNIA 03/02/2008  . PERITONEAL ADHESIONS 03/02/2008  . Anemia 02/07/2008  . Poorly controlled type 2 diabetes mellitus with neuropathy ([place]) 08/19/2007  . GAD (generalized anxiety disorder) 04/15/2007  . DISORDERS, ORGANIC SLEEP NEC 08/23/2006  . SYNDROME, RESTLESS LEGS 08/23/2006  . SYNDROME, CARPAL TUNNEL 08/23/2006  . Hyperlipidemia associated with type 2 diabetes mellitus ([place]) 08/22/2006  . Essential hypertension 08/22/2006  . ALLERGIC RHINITIS 08/22/2006  . GERD 08/22/2006  . Fatty liver 08/22/2006  . History of seizure 08/22/2006   Past Medical History:  Diagnosis Date  . Allergy    allegic rhinitis  . Anemia   . Angiodysplasia of colon   . Anxiety   . Arthritis   . Blood transfusion   . Borderline diabetic    per patient medical history form  . Breast cancer ([place]) 02/2009   breast CA L invasive ductal CA(hormone receptor positive.)  . Cataract   . Colon polyp    hyperplastic  . Colon stricture ([place]) 01/14/2003  . Diabetes mellitus    pt's medical doctor took her off diabetic meds-blood sugars have not been a problem--and medications were causing pt's sugars to be too low  . Diverticulosis   . Esophageal stricture 02/04/2008  . GERD (gastroesophageal reflux disease)   . GI  bleed   . Hepatic steatosis   . Hiatal hernia   . Hyperlipidemia   . Hypertension   . Hypothyroidism   . IBS (irritable bowel syndrome)   . Melena 01/2008   anemia transfusion  . Menopause    per patient medical history form  . Osteoporosis   . Postmenopausal hormone therapy    per patient medical history form.  Marland Kitchen RLS (restless legs syndrome)   . Salmonella 05/2000   renal effects secondary to dehydration  . Seizure disorder ([place])   . Seizures ([place])    one seizure several yrs ago --etiology unknown-no problem since   Past Surgical History:  Procedure Laterality Date  . ABDOMINAL HYSTERECTOMY  1983  . BREAST SURGERY  02/2009   breast  biopsy invasive ductal CA-bilateral mastectomies  . EPIGASTRIC HERNIA REPAIR  01/02/2012   Procedure: HERNIA REPAIR EPIGASTRIC ADULT;  Surgeon: Pedro Earls, MD;  Location: [facility];  Service: General;  Laterality: N/A;  Laparoscopic Repair Paraesophageal Hernia, Nissen  . EYE SURGERY Left 07/2017   Dr. Zadie Rhine  . LAPAROSCOPIC NISSEN FUNDOPLICATION  [id number]   Procedure: LAPAROSCOPIC NISSEN FUNDOPLICATION;  Surgeon: Pedro Earls, MD;  Location: [facility];  Service: General;  Laterality: N/A;  . MASTECTOMY  04/2009   Bilateral for ductal carcinoma on L  . OVARY SURGERY  1986   ovarian tumor removed   Social History   Tobacco Use  . Smoking status: Never Smoker  . Smokeless tobacco: Never Used  Vaping Use  . Vaping Use: Never used  Substance Use Topics  . Alcohol use: No    Alcohol/week: 0.0 standard drinks  . Drug use: No   Family History  Problem Relation Age of Onset  . Heart attack Father   . Diabetes Father   . Esophageal cancer Father   . Breast cancer Sister        x 2 sisters  . Diabetes Sister   . Hypertension Brother   . Diabetes Brother   . Hypertension Mother   . Stroke Mother   . Breast cancer Mother   . Hypertension Sister    Allergies  Allergen Reactions  . Arimidex [Anastrozole] Other (See Comments)     Joint pain  . Glipizide Other (See Comments)    Hypoglycemia   . Metformin And Related Other (See Comments)    diarrhea  . Penicillins Hives and Rash   Current Outpatient Medications on File Prior to Visit  Medication Sig Dispense Refill  . Alpha-Lipoic Acid 600 MG TABS Take 600 mg by mouth 2 (two) times daily. 180 tablet 3  . Dulaglutide (TRULICITY) 3 0000000 SOPN Inject 0.5 mLs (3 mg total) into the skin once a week. (Patient taking differently: Inject 3 mg into the skin every Saturday.) 6 mL 3  . empagliflozin (JARDIANCE) 25 MG TABS tablet Take 1 tablet (25 mg total) by mouth daily before breakfast. 90 tablet 1  . ezetimibe (ZETIA) 10 MG tablet TAKE 1 TABLET DAILY (Patient taking differently: Take 10 mg by mouth daily.) 90 tablet 0  . Multiple Vitamin (MULTIVITAMIN WITH MINERALS) TABS tablet Take 1 tablet by mouth daily.    . naproxen sodium (ALEVE) 220 MG tablet Take 220 mg by mouth daily as needed (pain).    . repaglinide (PRANDIN) 1 MG tablet Take 1 tablet (1 mg total) by mouth daily before supper. 90 tablet 3  . rOPINIRole (REQUIP) 1 MG tablet TAKE 1 TABLET AT BEDTIME (Patient taking differently: Take 1 mg by mouth at bedtime.) 90 tablet 3  . [DISCONTINUED] cetirizine (ZYRTEC) 10 MG tablet Take 10 mg by mouth daily as needed. For allergy symptoms     No current facility-administered medications on file prior to visit.    Review of Systems  Constitutional: Negative for activity change, appetite change, fatigue, fever and unexpected weight change.  HENT: Negative for congestion, ear pain, rhinorrhea, sinus pressure and sore throat.   Eyes: Positive for visual disturbance. Negative for pain and redness.  Respiratory: Negative for cough, shortness of breath and wheezing.   Cardiovascular: Negative for chest pain and palpitations.  Gastrointestinal: Negative for abdominal pain, blood in stool, constipation and diarrhea.  Endocrine: Negative for polydipsia and polyuria.   Genitourinary: Negative for dysuria, frequency and urgency.  Musculoskeletal:  Positive for arthralgias. Negative for back pain and myalgias.  Skin: Negative for pallor and rash.  Allergic/Immunologic: Negative for environmental allergies.  Neurological: Negative for dizziness, syncope and headaches.  Hematological: Negative for adenopathy. Does not bruise/bleed easily.  Psychiatric/Behavioral: Negative for decreased concentration and dysphoric mood. The patient is not nervous/anxious.        Objective:   Physical Exam Constitutional:      General: She is not in acute distress.    Appearance: Normal appearance. She is well-developed and normal weight. She is not ill-appearing or diaphoretic.  HENT:     Head: Normocephalic and atraumatic.     Right Ear: Tympanic membrane, ear canal and external ear normal.     Left Ear: Tympanic membrane, ear canal and external ear normal.     Nose: Nose normal. No congestion.     Mouth/Throat:     Mouth: Mucous membranes are moist.     Pharynx: Oropharynx is clear. No posterior oropharyngeal erythema.  Eyes:     General: No scleral icterus.    Extraocular Movements: Extraocular movements intact.     Conjunctiva/sclera: Conjunctivae normal.     Pupils: Pupils are equal, round, and reactive to light.  Neck:     Thyroid: No thyromegaly.     Vascular: No carotid bruit or JVD.  Cardiovascular:     Rate and Rhythm: Normal rate and regular rhythm.     Pulses: Normal pulses.     Heart sounds: Normal heart sounds. No gallop.   Pulmonary:     Effort: Pulmonary effort is normal. No respiratory distress.     Breath sounds: Normal breath sounds. No wheezing.     Comments: Good air exch Chest:     Chest wall: No tenderness.  Abdominal:     General: Bowel sounds are normal. There is no distension or abdominal bruit.     Palpations: Abdomen is soft. There is no mass.     Tenderness: There is no abdominal tenderness.     Hernia: No hernia is present.   Genitourinary:    Comments: No lumps or abn in area of mastectomy incision or axillae  Musculoskeletal:        General: No tenderness. Normal range of motion.     Cervical back: Normal range of motion and neck supple. No rigidity. No muscular tenderness.     Right lower leg: No edema.     Left lower leg: No edema.  Lymphadenopathy:     Cervical: No cervical adenopathy.  Skin:    General: Skin is warm and dry.     Coloration: Skin is not pale.     Findings: No erythema or rash.     Comments: Solar lentigines diffusely   Neurological:     Mental Status: She is alert. Mental status is at baseline.     Cranial Nerves: No cranial nerve deficit.     Motor: No abnormal muscle tone.     Coordination: Coordination normal.     Gait: Gait normal.     Deep Tendon Reflexes: Reflexes are normal and symmetric. Reflexes normal.  Psychiatric:        Mood and Affect: Mood normal.        Cognition and Memory: Cognition and memory normal.           Assessment & Plan:   Problem List Items Addressed This Visit      Cardiovascular and Mediastinum   Essential hypertension    bp in fair control at  this time  BP Readings from Last 1 Encounters:  04/01/20 114/72   No changes needed Most recent labs reviewed  Disc lifstyle change with low sodium diet and exercise  Plan to continue amlodipine 5 mg daily  hctz 25 mg daily  Losartan 100 mg daily       Relevant Medications   amLODipine (NORVASC) 5 MG tablet   hydrochlorothiazide (HYDRODIURIL) 25 MG tablet   losartan (COZAAR) 100 MG tablet   rosuvastatin (CRESTOR) 10 MG tablet     Endocrine   Poorly controlled type 2 diabetes mellitus with neuropathy ([place])    Per pt doing better, last a1c in low 7 range  Sees Dr Cruzita Lederer  Diet is fair  No recent med change  Neuropathy is a bit better with better glucose  Vision is still a problem  Taking statin and arb      Relevant Medications   losartan (COZAAR) 100 MG tablet   rosuvastatin  (CRESTOR) 10 MG tablet   Hyperlipidemia associated with type 2 diabetes mellitus (HCC)    Disc goals for lipids and reasons to control them Rev last labs with pt Rev low sat fat diet in detail Improved LDL and trig with crestor twice weekly which she tolerates        Relevant Medications   losartan (COZAAR) 100 MG tablet   rosuvastatin (CRESTOR) 10 MG tablet   Diabetic neuropathy (H[facility])    Some improvement with better glucose control       Relevant Medications   losartan (COZAAR) 100 MG tablet   rosuvastatin (CRESTOR) 10 MG tablet   Hypothyroidism    Hypothyroidism  Pt has no clinical changes No change in energy level/ hair or skin/ edema and no tremor Lab Results  Component Value Date   TSH 2.53 03/05/2020    Plan to continue levothyroxine 50 mcg daily       Relevant Medications   levothyroxine (SYNTHROID) 50 MCG tablet     Other   Anemia    Nl Hb and ferritin this draw  Platelet ct is low at 95       Relevant Orders   CBC with Differential/Platelet   Iron   GAD (generalized anxiety disorder)    Stable on zoloft 100 mg daily  Encouraged good self care       Relevant Medications   sertraline (ZOLOFT) 100 MG tablet   TRANSAMINASES, SERUM, ELEVATED    Normal today      Routine general medical examination at a health care facility - Primary    Reviewed health habits including diet and exercise and skin cancer prevention Reviewed appropriate screening tests for age  Also reviewed health mt list, fam hx and immunization status , as well as social and family history    See HPI Labs reviewed  Has had covid and vaccines  Intentional wt loss noted  Nl dexa 12/15 and no falls or fx  Colonoscopy 1/16        Thrombocytopenia (HCC)    Platelet ct of 95 No symptoms Nl ferritin and Hb today  Will continue to watch - plan re check in about 3 months       Relevant Orders   CBC with Differential/Platelet   Vitamin B12 deficiency    Lab Results  Component  Value Date   VITAMINB12 335 03/05/2020   Continues oral supplementation

## 2020-04-01 NOTE — Assessment & Plan Note (Signed)
Some improvement with better glucose control

## 2020-04-01 NOTE — Assessment & Plan Note (Signed)
Per pt doing better, last a1c in low 7 range  Sees Dr Cruzita Lederer  Diet is fair  No recent med change  Neuropathy is a bit better with better glucose  Vision is still a problem  Taking statin and arb

## 2020-04-01 NOTE — Assessment & Plan Note (Signed)
Normal today

## 2020-04-01 NOTE — Assessment & Plan Note (Signed)
Disc goals for lipids and reasons to control them Rev last labs with pt Rev low sat fat diet in detail Improved LDL and trig with crestor twice weekly which she tolerates

## 2020-04-01 NOTE — Patient Instructions (Addendum)
Get vitamin D3 over the counter and take 2000 iu daily  I want to watch your platlet count (role in clotting)  If you notice new bruises for no reason or more bleeding let us know   I want to re check platelet count in about 3 months   Other labs stable  Triglycerides are better

## 2020-04-01 NOTE — Assessment & Plan Note (Signed)
Stable on zoloft 100 mg daily  Encouraged good self care

## 2020-04-01 NOTE — Assessment & Plan Note (Signed)
Reviewed health habits including diet and exercise and skin cancer prevention Reviewed appropriate screening tests for age  Also reviewed health mt list, fam hx and immunization status , as well as social and family history    See HPI Labs reviewed  Has had covid and vaccines  Intentional wt loss noted  Nl dexa 12/15 and no falls or fx  Colonoscopy 1/16

## 2020-04-01 NOTE — Assessment & Plan Note (Signed)
Nl Hb and ferritin this draw  Platelet ct is low at 95

## 2020-04-01 NOTE — Assessment & Plan Note (Signed)
Hypothyroidism  Pt has no clinical changes No change in energy level/ hair or skin/ edema and no tremor Lab Results  Component Value Date   TSH 2.53 03/05/2020    Plan to continue levothyroxine 50 mcg daily

## 2020-04-09 ENCOUNTER — Other Ambulatory Visit: Payer: Self-pay | Admitting: Family Medicine

## 2020-04-09 NOTE — Telephone Encounter (Signed)
Last filled on 04/16/19 #90 tabs with 3 refills, f/u appt scheduled on 07/01/20

## 2020-04-12 ENCOUNTER — Other Ambulatory Visit: Payer: Self-pay | Admitting: Family Medicine

## 2020-04-13 NOTE — Telephone Encounter (Signed)
Pharmacy requests refill on: Ezetimibe 10 mg   LAST REFILL: 01/12/2020 (Q-90, R-0)   LAST OV: 04/01/2020 NEXT OV: 07/01/2020 PHARMACY: [place], [place]

## 2020-04-15 ENCOUNTER — Other Ambulatory Visit: Payer: Self-pay

## 2020-04-15 ENCOUNTER — Ambulatory Visit (INDEPENDENT_AMBULATORY_CARE_PROVIDER_SITE_OTHER): Payer: Medicare Other | Admitting: Internal Medicine

## 2020-04-15 ENCOUNTER — Encounter: Payer: Self-pay | Admitting: Internal Medicine

## 2020-04-15 VITALS — BP 120/78 | HR 90 | Ht 63.0 in | Wt 136.6 lb

## 2020-04-15 DIAGNOSIS — E1140 Type 2 diabetes mellitus with diabetic neuropathy, unspecified: Secondary | ICD-10-CM | POA: Diagnosis not present

## 2020-04-15 DIAGNOSIS — E1142 Type 2 diabetes mellitus with diabetic polyneuropathy: Secondary | ICD-10-CM

## 2020-04-15 DIAGNOSIS — E1165 Type 2 diabetes mellitus with hyperglycemia: Secondary | ICD-10-CM | POA: Diagnosis not present

## 2020-04-15 DIAGNOSIS — E1169 Type 2 diabetes mellitus with other specified complication: Secondary | ICD-10-CM

## 2020-04-15 DIAGNOSIS — E785 Hyperlipidemia, unspecified: Secondary | ICD-10-CM

## 2020-04-15 LAB — POCT GLYCOSYLATED HEMOGLOBIN (HGB A1C): Hemoglobin A1C: 6.2 % — AB (ref 4.0–5.6)

## 2020-04-15 MED ORDER — EMPAGLIFLOZIN 25 MG PO TABS: 25.0000 mg | ORAL_TABLET | Freq: Every day | ORAL | 3 refills | Status: DC

## 2020-04-15 NOTE — Patient Instructions (Signed)
Please continue: - Jardiance 25 mg before b'fast - Trulicity 3 mg weekly - Prandin 1 mg before dinner  Also, for neuropathy, continue: - alpha-lipoic acid 600 mg 2x a day  Please return in 3-4 months with your sugar log.

## 2020-04-15 NOTE — Addendum Note (Signed)
Addended by: Lauralyn Primes on: 04/15/2020 10:41 AM   Modules accepted: Orders
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
## 2020-05-10 LAB — HM DIABETES EYE EXAM

## 2020-05-31 ENCOUNTER — Encounter (INDEPENDENT_AMBULATORY_CARE_PROVIDER_SITE_OTHER): Payer: Self-pay | Admitting: Ophthalmology

## 2020-05-31 ENCOUNTER — Ambulatory Visit (INDEPENDENT_AMBULATORY_CARE_PROVIDER_SITE_OTHER): Payer: Medicare Other | Admitting: Ophthalmology

## 2020-05-31 ENCOUNTER — Other Ambulatory Visit: Payer: Self-pay

## 2020-05-31 DIAGNOSIS — H34832 Tributary (branch) retinal vein occlusion, left eye, with macular edema: Secondary | ICD-10-CM | POA: Diagnosis not present

## 2020-05-31 DIAGNOSIS — E119 Type 2 diabetes mellitus without complications: Secondary | ICD-10-CM | POA: Diagnosis not present

## 2020-05-31 MED ORDER — AFLIBERCEPT 2MG/0.05ML IZ SOLN FOR KALEIDOSCOPE
2.0000 mg | INTRAVITREAL | Status: AC | PRN
  Administered 2020-05-31: 2 mg via INTRAVITREAL

## 2020-05-31 NOTE — Patient Instructions (Signed)
Patient instructed to contact the office promptly for new onset visual acuity declines or distortions 

## 2020-05-31 NOTE — Progress Notes (Signed)
05/31/2020     CHIEF COMPLAINT Patient presents for Retina Follow Up (10 Week BRVO f\u. Possible Eylea OS. OCT/Pt states vision is not doing good. Pt has a new gls rx. Pt states she can not tell a difference with the new gls./BGL: 126/A1C: 6.3?)   HISTORY OF PRESENT ILLNESS: Valerie Ball is a 75 y.o. female who presents to the clinic today for:   HPI    Retina Follow Up    Patient presents with  CRVO/BRVO.  In left eye.  Severity is moderate.  Duration of 10 weeks.  Since onset it is stable.  I, the attending physician,  performed the HPI with the patient and updated documentation appropriately. Additional comments: 10 Week BRVO f\u. Possible Eylea OS. OCT Pt states vision is not doing good. Pt has a new gls rx. Pt states she can not tell a difference with the new gls. BGL: 126 A1C: 6.3?       Last edited by Tilda Franco on 05/31/2020  9:13 AM. (History)      Referring physician: Tower, Wynelle Fanny, MD [place],  [place] [address]  HISTORICAL INFORMATION:   Selected notes from the MEDICAL RECORD NUMBER    Lab Results  Component Value Date   HGBA1C 6.2 (A) 04/15/2020     CURRENT MEDICATIONS: No current outpatient medications on file. (Ophthalmic Drugs)   No current facility-administered medications for this visit. (Ophthalmic Drugs)   Current Outpatient Medications (Other)  Medication Sig  . Alpha-Lipoic Acid 600 MG TABS Take 600 mg by mouth 2 (two) times daily. (Patient taking differently: Take 600 mg by mouth 2 (two) times daily. Pt taking as needed.)  . amLODipine (NORVASC) 5 MG tablet Take 1 tablet (5 mg total) by mouth daily.  . Dulaglutide (TRULICITY) 3 XI/3.3AS SOPN Inject 0.5 mLs (3 mg total) into the skin once a week. (Patient taking differently: Inject 3 mg into the skin every Saturday.)  . empagliflozin (JARDIANCE) 25 MG TABS tablet Take 1 tablet (25 mg total) by mouth daily before breakfast.  . ezetimibe (ZETIA) 10 MG tablet TAKE 1  TABLET DAILY  . fluticasone (FLONASE) 50 MCG/ACT nasal spray Place 2 sprays into both nostrils daily as needed. Allergies  . hydrochlorothiazide (HYDRODIURIL) 25 MG tablet Take 1 tablet (25 mg total) by mouth daily.  Marland Kitchen levothyroxine (SYNTHROID) 50 MCG tablet TAKE 1 TABLET DAILY BEFORE BREAKFAST  . losartan (COZAAR) 100 MG tablet Take 1 tablet (100 mg total) by mouth daily.  . Multiple Vitamin (MULTIVITAMIN WITH MINERALS) TABS tablet Take 1 tablet by mouth daily.  . naproxen sodium (ALEVE) 220 MG tablet Take 220 mg by mouth daily as needed (pain).  Marland Kitchen omeprazole (PRILOSEC) 20 MG capsule Take 1 capsule (20 mg total) by mouth daily.  . repaglinide (PRANDIN) 1 MG tablet Take 1 tablet (1 mg total) by mouth daily before supper.  Marland Kitchen rOPINIRole (REQUIP) 1 MG tablet TAKE 1 TABLET AT BEDTIME  . rosuvastatin (CRESTOR) 10 MG tablet TAKE 1 TABLET TWICE A WEEK  . sertraline (ZOLOFT) 100 MG tablet Take 1 tablet (100 mg total) by mouth at bedtime.   No current facility-administered medications for this visit. (Other)      REVIEW OF SYSTEMS: ROS    Positive for: Endocrine   Last edited by Tilda Franco on 05/31/2020  9:13 AM. (History)       ALLERGIES Allergies  Allergen Reactions  . Arimidex [Anastrozole] Other (See Comments)  Joint pain  . Glipizide Other (See Comments)    Hypoglycemia   . Metformin And Related Other (See Comments)    diarrhea  . Penicillins Hives and Rash    PAST MEDICAL HISTORY Past Medical History:  Diagnosis Date  . Allergy    allegic rhinitis  . Anemia   . Angiodysplasia of colon   . Anxiety   . Arthritis   . Blood transfusion   . Borderline diabetic    per patient medical history form  . Breast cancer ([place]) 02/2009   breast CA L invasive ductal CA(hormone receptor positive.)  . Cataract   . Colon polyp    hyperplastic  . Colon stricture ([place]) 01/14/2003  . Diabetes mellitus    pt's medical doctor took her off diabetic meds-blood sugars have not  been a problem--and medications were causing pt's sugars to be too low  . Diverticulosis   . Esophageal stricture 02/04/2008  . GERD (gastroesophageal reflux disease)   . GI bleed   . Hepatic steatosis   . Hiatal hernia   . Hyperlipidemia   . Hypertension   . Hypothyroidism   . IBS (irritable bowel syndrome)   . Melena 01/2008   anemia transfusion  . Menopause    per patient medical history form  . Osteoporosis   . Postmenopausal hormone therapy    per patient medical history form.  Marland Kitchen RLS (restless legs syndrome)   . Salmonella 05/2000   renal effects secondary to dehydration  . Seizure disorder ([place])   . Seizures ([place])    one seizure several yrs ago --etiology unknown-no problem since   Past Surgical History:  Procedure Laterality Date  . ABDOMINAL HYSTERECTOMY  1983  . BREAST SURGERY  02/2009   breast biopsy invasive ductal CA-bilateral mastectomies  . EPIGASTRIC HERNIA REPAIR  01/02/2012   Procedure: HERNIA REPAIR EPIGASTRIC ADULT;  Surgeon: Pedro Earls, MD;  Location: [facility];  Service: General;  Laterality: N/A;  Laparoscopic Repair Paraesophageal Hernia, Nissen  . EYE SURGERY Left 07/2017   Dr. Zadie Rhine  . LAPAROSCOPIC NISSEN FUNDOPLICATION  81/19/1478   Procedure: LAPAROSCOPIC NISSEN FUNDOPLICATION;  Surgeon: Pedro Earls, MD;  Location: [facility];  Service: General;  Laterality: N/A;  . MASTECTOMY  04/2009   Bilateral for ductal carcinoma on L  . OVARY SURGERY  1986   ovarian tumor removed    FAMILY HISTORY Family History  Problem Relation Age of Onset  . Heart attack Father   . Diabetes Father   . Esophageal cancer Father   . Breast cancer Sister        x 2 sisters  . Diabetes Sister   . Hypertension Brother   . Diabetes Brother   . Hypertension Mother   . Stroke Mother   . Breast cancer Mother   . Hypertension Sister     SOCIAL HISTORY Social History   Tobacco Use  . Smoking status: Never Smoker  . Smokeless tobacco: Never Used  Vaping Use   . Vaping Use: Never used  Substance Use Topics  . Alcohol use: No    Alcohol/week: 0.0 standard drinks  . Drug use: No         OPHTHALMIC EXAM:  Base Eye Exam    Visual Acuity (Snellen - Linear)      Right Left   Dist cc 20/20 20/100   Dist ph cc  20/80 +2   Correction: Glasses       Tonometry (Tonopen, 9:17 AM)  Right Left   Pressure 12 14       Pupils      Pupils Dark Light Shape React APD   Right PERRL 3 2 Round Brisk None   Left PERRL 3 2 Round Brisk None       Visual Fields (Counting fingers)      Left Right    Full Full       Neuro/Psych    Oriented x3: Yes   Mood/Affect: Normal       Dilation    Both eyes: 1.0% Mydriacyl, 2.5% Phenylephrine @ 9:17 AM        Slit Lamp and Fundus Exam    External Exam      Right Left   External Normal Normal       Slit Lamp Exam      Right Left   Lids/Lashes Normal Normal   Conjunctiva/Sclera White and quiet White and quiet   Cornea Clear Clear   Anterior Chamber Deep and quiet Deep and quiet   Iris Round and reactive Round and reactive   Lens Posterior chamber intraocular lens Posterior chamber intraocular lens   Anterior Vitreous Normal Normal       Fundus Exam      Right Left   Posterior Vitreous Posterior vitreous detachment Central vitreous floaters   Disc Normal Normal, collaterals on nerve   C/D Ratio 0.5 0.55   Macula Normal Good Focal laser, Microaneurysms, no macular thickening, no cystoid macular edema   Vessels Normal, , no DR Macular branch retinal vein occlusion superotemporal.  No CME recurrence   Periphery Normal Normal          IMAGING AND PROCEDURES  Imaging and Procedures for 05/31/20  OCT, Retina - OU - Both Eyes       Right Eye Quality was good. Scan locations included subfoveal. Central Foveal Thickness: 251. Progression has been stable. Findings include abnormal foveal contour, no IRF, no SRF, retinal drusen .   Left Eye Quality was good. Scan locations included  subfoveal. Central Foveal Thickness: 235. Progression has been stable. Findings include abnormal foveal contour, myopic contour, no SRF, no IRF.   Notes OD, retinal drusen but no active CNVM  OS with history of BRVO with CME yet stable at this time with no recurrence of CNVM currently 10-week follow-up post Eylea, no recurrence in the region superior to the fovea left eye       Intravitreal Injection, Pharmacologic Agent - OS - Left Eye       Time Out 05/31/2020. 10:07 AM. Confirmed correct patient, procedure, site, and patient consented.   Anesthesia Topical anesthesia was used. Anesthetic medications included Akten 3.5%.   Procedure Preparation included Ofloxacin , 10% betadine to eyelids, 5% betadine to ocular surface, Tobramycin 0.3%. A 30 gauge needle was used.   Injection:  2 mg aflibercept Alfonse Flavors) SOLN   NDC: 44818-563-14   Route: Intravitreal, Site: Left Eye, Waste: 0 mg  Post-op Post injection exam found visual acuity of at least counting fingers. The patient tolerated the procedure well. There were no complications. The patient received written and verbal post procedure care education. Post injection medications were not given.                 ASSESSMENT/PLAN:  Branch retinal vein occlusion with macular edema of left eye Today at 10 weeks post injection Eylea, no recurrence of CME today.      ICD-10-CM   1. Branch retinal vein occlusion with macular  edema of left eye  H34.8320 OCT, Retina - OU - Both Eyes    Intravitreal Injection, Pharmacologic Agent - OS - Left Eye    aflibercept (EYLEA) SOLN 2 mg    1.  No detectable diabetic retinopathy OU  2.  Improving and more controlled branch retinal vein occlusion with CME OS on therapy currently at 10-week interval  3.  Repeat injection Eylea OS in order to maintain resolution of CME, and extend interval examination to 3 months with no planned injection  Ophthalmic Meds Ordered this visit:  Meds ordered  this encounter  Medications  . aflibercept (EYLEA) SOLN 2 mg       Return in about 3 months (around 08/31/2020) for DILATE OU, OCT, COLOR FP.  Patient Instructions  Patient instructed to contact the office promptly for new onset visual acuity declines or distortions    Explained the diagnoses, plan, and follow up with the patient and they expressed understanding.  Patient expressed understanding of the importance of proper follow up care.   Clent Demark Zyonna Vardaman M.D. Diseases & Surgery of the Retina and Vitreous Retina & Diabetic [place] 05/31/20     Abbreviations: M myopia (nearsighted); A astigmatism; H hyperopia (farsighted); P presbyopia; Mrx spectacle prescription;  CTL contact lenses; OD right eye; OS left eye; OU both eyes  XT exotropia; ET esotropia; PEK punctate epithelial keratitis; PEE punctate epithelial erosions; DES dry eye syndrome; MGD meibomian gland dysfunction; ATs artificial tears; PFAT's preservative free artificial tears; [place] nuclear sclerotic cataract; PSC posterior subcapsular cataract; ERM epi-retinal membrane; PVD posterior vitreous detachment; RD retinal detachment; DM diabetes mellitus; DR diabetic retinopathy; NPDR non-proliferative diabetic retinopathy; PDR proliferative diabetic retinopathy; CSME clinically significant macular edema; DME diabetic macular edema; dbh dot blot hemorrhages; CWS cotton wool spot; POAG primary open angle glaucoma; C/D cup-to-disc ratio; HVF humphrey visual field; GVF goldmann visual field; OCT optical coherence tomography; IOP intraocular pressure; BRVO Branch retinal vein occlusion; CRVO central retinal vein occlusion; CRAO central retinal artery occlusion; BRAO branch retinal artery occlusion; RT retinal tear; SB scleral buckle; PPV pars plana vitrectomy; VH Vitreous hemorrhage; PRP panretinal laser photocoagulation; IVK intravitreal kenalog; VMT vitreomacular traction; MH Macular hole;  NVD neovascularization of the disc; NVE  neovascularization elsewhere; AREDS age related eye disease study; ARMD age related macular degeneration; POAG primary open angle glaucoma; EBMD epithelial/anterior basement membrane dystrophy; ACIOL anterior chamber intraocular lens; IOL intraocular lens; PCIOL posterior chamber intraocular lens; Phaco/IOL phacoemulsification with intraocular lens placement; [place] photorefractive keratectomy; LASIK laser assisted in situ keratomileusis; HTN hypertension; DM diabetes mellitus; COPD chronic obstructive pulmonary disease

## 2020-05-31 NOTE — Assessment & Plan Note (Signed)
Today at 10 weeks post injection Eylea, no recurrence of CME today.

## 2020-06-28 ENCOUNTER — Other Ambulatory Visit: Payer: Self-pay

## 2020-06-28 ENCOUNTER — Other Ambulatory Visit (INDEPENDENT_AMBULATORY_CARE_PROVIDER_SITE_OTHER): Payer: Medicare Other

## 2020-06-28 DIAGNOSIS — D649 Anemia, unspecified: Secondary | ICD-10-CM | POA: Diagnosis not present

## 2020-06-28 DIAGNOSIS — D696 Thrombocytopenia, unspecified: Secondary | ICD-10-CM | POA: Diagnosis not present

## 2020-06-28 LAB — CBC WITH DIFFERENTIAL/PLATELET
Basophils Absolute: 0 10*3/uL (ref 0.0–0.1)
Basophils Relative: 0.4 % (ref 0.0–3.0)
Eosinophils Absolute: 0 10*3/uL (ref 0.0–0.7)
Eosinophils Relative: 0.7 % (ref 0.0–5.0)
HCT: 41.8 % (ref 36.0–46.0)
Hemoglobin: 13.7 g/dL (ref 12.0–15.0)
Lymphocytes Relative: 20.9 % (ref 12.0–46.0)
Lymphs Abs: 1.4 10*3/uL (ref 0.7–4.0)
MCHC: 32.9 g/dL (ref 30.0–36.0)
MCV: 80.9 fl (ref 78.0–100.0)
Monocytes Absolute: 0.4 10*3/uL (ref 0.1–1.0)
Monocytes Relative: 6 % (ref 3.0–12.0)
Neutro Abs: 4.7 10*3/uL (ref 1.4–7.7)
Neutrophils Relative %: 72 % (ref 43.0–77.0)
Platelets: 95 10*3/uL — ABNORMAL LOW (ref 150.0–400.0)
RBC: 5.17 Mil/uL — ABNORMAL HIGH (ref 3.87–5.11)
RDW: 17.1 % — ABNORMAL HIGH (ref 11.5–15.5)
WBC: 6.5 10*3/uL (ref 4.0–10.5)

## 2020-06-28 LAB — IRON: Iron: 66 ug/dL (ref 42–145)

## 2020-07-01 ENCOUNTER — Ambulatory Visit (INDEPENDENT_AMBULATORY_CARE_PROVIDER_SITE_OTHER): Payer: Medicare Other | Admitting: Family Medicine

## 2020-07-01 ENCOUNTER — Encounter: Payer: Self-pay | Admitting: Family Medicine

## 2020-07-01 ENCOUNTER — Other Ambulatory Visit: Payer: Self-pay

## 2020-07-01 VITALS — BP 122/68 | HR 80 | Temp 97.0°F | Ht 63.0 in | Wt 137.5 lb

## 2020-07-01 DIAGNOSIS — D696 Thrombocytopenia, unspecified: Secondary | ICD-10-CM

## 2020-07-01 NOTE — Patient Instructions (Addendum)
I want to continue watching platelet count It is stable   If you develop problems with bruising or excessive bleeding let us know   Let me know if you want a referral to GI for hemorrhoids also let me know   Take care of your self   Schedule labs in 3 months

## 2020-07-01 NOTE — Assessment & Plan Note (Signed)
Low grade immune thrombocytopenia Stable platelet ct at 95 No bleeding or bruising excessively  No abd pain  Pt knows to watch for  Iron level is nl Will re check 3 mo with path rev  If lower consider hematology ref

## 2020-07-01 NOTE — Progress Notes (Signed)
Subjective:    Patient ID: Valerie Ball, female    DOB: 1945/05/18, 75 y.o.   MRN: [id number]  This visit occurred during the SARS-CoV-2 public health emergency.  Safety protocols were in place, including screening questions prior to the visit, additional usage of staff PPE, and extensive cleaning of exam room while observing appropriate contact time as indicated for disinfecting solutions.    HPI Pt presents for f/u of chronic health problems  Wt Readings from Last 3 Encounters:  07/01/20 137 lb 8 oz (62.4 kg)  04/15/20 136 lb 9.6 oz (62 kg)  04/01/20 139 lb 7 oz (63.2 kg)   24.36 kg/m   No big changes  Has been busy- is moving (overwhelming)   H/o low platelets  Lab Results  Component Value Date   WBC 6.5 06/28/2020   HGB 13.7 06/28/2020   HCT 41.8 06/28/2020   MCV 80.9 06/28/2020   PLT 95.0 (L) 06/28/2020  overall stable   Was addressed by hematology years ago- Dr Lindi Adie  Low grade immune thrombocytopenia   Has been 95 the last 2 checks   No excessive bruising (nothing above normal)  She bleeds for a while after injuries but not a lot of those- is able to stop it  No nosebleeds  Some hemorrhoid issues with bleeding   nsaid - seldom uses aleve   Normal iron Lab Results  Component Value Date   IRON 66 06/28/2020   TIBC 508 (H) 11/07/2016   FERRITIN 20.5 03/05/2020   HTN bp is stable today  No cp or palpitations or headaches or edema  No side effects to medicines  BP Readings from Last 3 Encounters:  07/01/20 122/68  04/15/20 120/78  04/01/20 114/72     Lab Results  Component Value Date   CREATININE 0.90 03/05/2020   BUN 24 (H) 03/05/2020   NA 142 03/05/2020   K 3.5 03/05/2020   CL 102 03/05/2020   CO2 31 03/05/2020   Patient Active Problem List   Diagnosis Date Noted  . Posterior vitreous detachment of right eye 11/06/2019  . Cystoid macular edema of left eye 07/14/2019  . Branch retinal vein occlusion with macular edema of left eye  07/14/2019  . Hypertensive retinopathy of left eye 07/14/2019  . Vitreous floaters of left eye 07/14/2019  . Right foot pain 04/09/2019  . Rectal bleeding 05/20/2018  . Fatigue 05/20/2018  . Hip pain, bilateral 08/31/2017  . Vitamin B12 deficiency 09/21/2016  . Benign neoplasm of tongue 04/11/2016  . Diabetic gastroparesis ([place]) 03/27/2016  . Retinal vein occlusion, branch 01/03/2016  . Alternating constipation and diarrhea 08/12/2015  . AVM (arteriovenous malformation) of colon 05/18/2015  . IBS (irritable bowel syndrome) 02/28/2015  . Diverticulitis of colon 10/14/2014  . Gout 07/22/2014  . Hypothyroidism 04/23/2014  . Nausea without vomiting 02/18/2014  . Encounter for Medicare annual wellness exam 01/23/2014  . Estrogen deficiency 01/23/2014  . Colon cancer screening 01/23/2014  . Tremor 01/20/2014  . Allergic rhinitis 01/07/2014  . Thrombocytopenia ([place]) 11/25/2013  . Diabetic neuropathy ([place]) 11/25/2013  . Lap Nissen October 2013 01/19/2012  . Large type III mixed hiatus hernia  12/06/2011  . Stress reaction 10/25/2011  . Routine general medical examination at a health care facility 07/11/2011  . TRANSAMINASES, SERUM, ELEVATED 11/11/2009  . History of breast cancer 08/02/2009  . DIVERTICULOSIS-COLON 03/03/2008  . ESOPHAGEAL STRICTURE 03/02/2008  . HIATAL HERNIA 03/02/2008  . PERITONEAL ADHESIONS 03/02/2008  . Anemia 02/07/2008  . Poorly controlled  type 2 diabetes mellitus with neuropathy ([place]) 08/19/2007  . GAD (generalized anxiety disorder) 04/15/2007  . DISORDERS, ORGANIC SLEEP NEC 08/23/2006  . SYNDROME, RESTLESS LEGS 08/23/2006  . SYNDROME, CARPAL TUNNEL 08/23/2006  . Hyperlipidemia associated with type 2 diabetes mellitus ([place]) 08/22/2006  . Essential hypertension 08/22/2006  . ALLERGIC RHINITIS 08/22/2006  . GERD 08/22/2006  . Fatty liver 08/22/2006  . History of seizure 08/22/2006   Past Medical History:  Diagnosis Date  . Allergy    allegic rhinitis   . Anemia   . Angiodysplasia of colon   . Anxiety   . Arthritis   . Blood transfusion   . Borderline diabetic    per patient medical history form  . Breast cancer ([place]) 02/2009   breast CA L invasive ductal CA(hormone receptor positive.)  . Cataract   . Colon polyp    hyperplastic  . Colon stricture ([facility] [place]) 01/14/2003  . Diabetes mellitus    pt's medical doctor took her off diabetic meds-blood sugars have not been a problem--and medications were causing pt's sugars to be too low  . Diverticulosis   . Esophageal stricture 02/04/2008  . GERD (gastroesophageal reflux disease)   . GI bleed   . Hepatic steatosis   . Hiatal hernia   . Hyperlipidemia   . Hypertension   . Hypothyroidism   . IBS (irritable bowel syndrome)   . Melena 01/2008   anemia transfusion  . Menopause    per patient medical history form  . Osteoporosis   . Postmenopausal hormone therapy    per patient medical history form.  Marland Kitchen RLS (restless legs syndrome)   . Salmonella 05/2000   renal effects secondary to dehydration  . Seizure disorder ([place])   . Seizures ([place])    one seizure several yrs ago --etiology unknown-no problem since   Past Surgical History:  Procedure Laterality Date  . ABDOMINAL HYSTERECTOMY  1983  . BREAST SURGERY  02/2009   breast biopsy invasive ductal CA-bilateral mastectomies  . EPIGASTRIC HERNIA REPAIR  01/02/2012   Procedure: HERNIA REPAIR EPIGASTRIC ADULT;  Surgeon: Pedro Earls, MD;  Location: [facility];  Service: General;  Laterality: N/A;  Laparoscopic Repair Paraesophageal Hernia, Nissen  . EYE SURGERY Left 07/2017   Dr. Zadie Rhine  . LAPAROSCOPIC NISSEN FUNDOPLICATION  26/94/8546   Procedure: LAPAROSCOPIC NISSEN FUNDOPLICATION;  Surgeon: Pedro Earls, MD;  Location: [facility];  Service: General;  Laterality: N/A;  . MASTECTOMY  04/2009   Bilateral for ductal carcinoma on L  . OVARY SURGERY  1986   ovarian tumor removed   Social History   Tobacco Use  . Smoking status: Never  Smoker  . Smokeless tobacco: Never Used  Vaping Use  . Vaping Use: Never used  Substance Use Topics  . Alcohol use: No    Alcohol/week: 0.0 standard drinks  . Drug use: No   Family History  Problem Relation Age of Onset  . Heart attack Father   . Diabetes Father   . Esophageal cancer Father   . Breast cancer Sister        x 2 sisters  . Diabetes Sister   . Hypertension Brother   . Diabetes Brother   . Hypertension Mother   . Stroke Mother   . Breast cancer Mother   . Hypertension Sister    Allergies  Allergen Reactions  . Arimidex [Anastrozole] Other (See Comments)    Joint pain  . Glipizide Other (See Comments)    Hypoglycemia   .  Metformin And Related Other (See Comments)    diarrhea  . Penicillins Hives and Rash   Current Outpatient Medications on File Prior to Visit  Medication Sig Dispense Refill  . Alpha-Lipoic Acid 600 MG TABS Take 600 mg by mouth 2 (two) times daily. (Patient taking differently: Take 600 mg by mouth 2 (two) times daily. Pt taking as needed.) 180 tablet 3  . amLODipine (NORVASC) 5 MG tablet Take 1 tablet (5 mg total) by mouth daily. 90 tablet 3  . Dulaglutide (TRULICITY) 3 JK/0.9FG SOPN Inject 0.5 mLs (3 mg total) into the skin once a week. (Patient taking differently: Inject 3 mg into the skin every Saturday.) 6 mL 3  . empagliflozin (JARDIANCE) 25 MG TABS tablet Take 1 tablet (25 mg total) by mouth daily before breakfast. 90 tablet 3  . ezetimibe (ZETIA) 10 MG tablet TAKE 1 TABLET DAILY 90 tablet 1  . fluticasone (FLONASE) 50 MCG/ACT nasal spray Place 2 sprays into both nostrils daily as needed. Allergies 48 g 3  . hydrochlorothiazide (HYDRODIURIL) 25 MG tablet Take 1 tablet (25 mg total) by mouth daily. 90 tablet 3  . levothyroxine (SYNTHROID) 50 MCG tablet TAKE 1 TABLET DAILY BEFORE BREAKFAST 90 tablet 3  . losartan (COZAAR) 100 MG tablet Take 1 tablet (100 mg total) by mouth daily. 90 tablet 3  . Multiple Vitamin (MULTIVITAMIN WITH MINERALS)  TABS tablet Take 1 tablet by mouth daily.    . naproxen sodium (ALEVE) 220 MG tablet Take 220 mg by mouth daily as needed (pain).    Marland Kitchen omeprazole (PRILOSEC) 20 MG capsule Take 1 capsule (20 mg total) by mouth daily. 90 capsule 3  . repaglinide (PRANDIN) 1 MG tablet Take 1 tablet (1 mg total) by mouth daily before supper. 90 tablet 3  . rOPINIRole (REQUIP) 1 MG tablet TAKE 1 TABLET AT BEDTIME 90 tablet 3  . rosuvastatin (CRESTOR) 10 MG tablet TAKE 1 TABLET TWICE A WEEK 24 tablet 3  . sertraline (ZOLOFT) 100 MG tablet Take 1 tablet (100 mg total) by mouth at bedtime. 90 tablet 3  . [DISCONTINUED] cetirizine (ZYRTEC) 10 MG tablet Take 10 mg by mouth daily as needed. For allergy symptoms     No current facility-administered medications on file prior to visit.    Review of Systems  Constitutional: Negative for activity change, appetite change, fatigue, fever and unexpected weight change.  HENT: Negative for congestion, ear pain, rhinorrhea, sinus pressure and sore throat.   Eyes: Negative for pain, redness and visual disturbance.  Respiratory: Negative for cough, shortness of breath and wheezing.   Cardiovascular: Negative for chest pain and palpitations.  Gastrointestinal: Negative for abdominal pain, blood in stool, constipation and diarrhea.  Endocrine: Negative for polydipsia and polyuria.  Genitourinary: Negative for dysuria, frequency and urgency.  Musculoskeletal: Negative for arthralgias, back pain and myalgias.  Skin: Negative for pallor and rash.  Allergic/Immunologic: Negative for environmental allergies.  Neurological: Negative for dizziness, syncope and headaches.  Hematological: Negative for adenopathy. Does not bruise/bleed easily.  Psychiatric/Behavioral: Negative for decreased concentration and dysphoric mood. The patient is not nervous/anxious.        Objective:    Physical Exam Constitutional:      General: She is not in acute distress.    Appearance: Normal  appearance. She is well-developed and normal weight. She is not ill-appearing.  HENT:     Head: Normocephalic and atraumatic.  Eyes:     Conjunctiva/sclera: Conjunctivae normal.     Pupils: Pupils are  equal, round, and reactive to light.  Neck:     Thyroid: No thyromegaly.     Vascular: No carotid bruit or JVD.  Cardiovascular:     Rate and Rhythm: Normal rate and regular rhythm.     Heart sounds: Normal heart sounds. No gallop.   Pulmonary:     Effort: Pulmonary effort is normal. No respiratory distress.     Breath sounds: Normal breath sounds. No wheezing or rales.  Abdominal:     General: Bowel sounds are normal. There is no distension or abdominal bruit.     Palpations: Abdomen is soft. There is no hepatomegaly, splenomegaly or mass.     Tenderness: There is no abdominal tenderness.  Musculoskeletal:     Cervical back: Normal range of motion and neck supple.  Lymphadenopathy:     Cervical: No cervical adenopathy.  Skin:    General: Skin is warm and dry.     Coloration: Skin is not jaundiced or pale.     Findings: No bruising, erythema or rash.     Comments: No bruising   Neurological:     Mental Status: She is alert.     Coordination: Coordination normal.     Deep Tendon Reflexes: Reflexes are normal and symmetric. Reflexes normal.  Psychiatric:        Mood and Affect: Mood normal.           Assessment & Plan:   Problem List Items Addressed This Visit      Other   Thrombocytopenia (HCC) - Primary    Low grade immune thrombocytopenia Stable platelet ct at 95 No bleeding or bruising excessively  No abd pain  Pt knows to watch for  Iron level is nl Will re check 3 mo with path rev  If lower consider hematology ref

## 2020-08-17 ENCOUNTER — Ambulatory Visit: Payer: TRICARE For Life (TFL) | Admitting: Internal Medicine
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
## 2020-08-18 ENCOUNTER — Ambulatory Visit (INDEPENDENT_AMBULATORY_CARE_PROVIDER_SITE_OTHER): Payer: Medicare Other | Admitting: Family Medicine

## 2020-08-18 ENCOUNTER — Other Ambulatory Visit: Payer: Self-pay

## 2020-08-18 ENCOUNTER — Ambulatory Visit (INDEPENDENT_AMBULATORY_CARE_PROVIDER_SITE_OTHER)
Admission: RE | Admit: 2020-08-18 | Discharge: 2020-08-18 | Disposition: A | Discharge status: Home / Self Care | Payer: Medicare Other | Source: Ambulatory Visit | Attending: Family Medicine | Admitting: Family Medicine

## 2020-08-18 ENCOUNTER — Encounter: Payer: Self-pay | Admitting: Family Medicine

## 2020-08-18 VITALS — BP 110/68 | HR 80 | Temp 97.7°F | Ht 63.0 in | Wt 144.0 lb

## 2020-08-18 DIAGNOSIS — R200 Anesthesia of skin: Secondary | ICD-10-CM | POA: Diagnosis not present

## 2020-08-18 DIAGNOSIS — M5416 Radiculopathy, lumbar region: Secondary | ICD-10-CM

## 2020-08-18 MED ORDER — PREDNISONE 20 MG PO TABS: ORAL_TABLET | ORAL | 0 refills | Status: DC

## 2020-08-18 MED ORDER — TIZANIDINE HCL 4 MG PO TABS
2.0000 mg | ORAL_TABLET | Freq: Every evening | ORAL | 0 refills | Status: DC | PRN
Start: 2020-08-18 — End: 2020-09-27

## 2020-08-18 NOTE — Progress Notes (Signed)
Valerie Ball T. Kingslee Mairena, MD, [place] [facility] [place], [address]  Phone: [phone]  FAX: [phone]  AMSI GRIMLEY - 75 y.o. female  MRN [id number]  Date of Birth: 06/19/1945  Date: 08/18/2020  PCP: Abner Greenspan, MD  Referral: Abner Greenspan, MD  Chief Complaint  Patient presents with  . Hip Pain  . Leg Pain    This visit occurred during the SARS-CoV-2 public health emergency.  Safety protocols were in place, including screening questions prior to the visit, additional usage of staff PPE, and extensive cleaning of exam room while observing appropriate contact time as indicated for disinfecting solutions.   Subjective:   TAJANA CROTTEAU is a 75 y.o. very pleasant female patient who presents with the following: Back Pain  ongoing for approximately: 1 week or so The patient has had back pain before. The back pain is localized into the lumbar spine area. They also describe radiculopathy to the knee.  She has tried sitting in a hot tub, and this did help as well as some topical lidocaine.  She has no major traumatic injury or fractures in the affected region of the back or pelvis.  One time in the groin did bother, but this was very short-lived.  L lateral LE numb.- lateral only, pinprick only  Just move and did a lot of lifting. More than normal.   No numbness or tingling. No bowel or bladder incontinence. No focal weakness. Prior interventions: None Physical therapy: No Chiropractic manipulations: No Acupuncture: No Osteopathic manipulation: No Heat or cold: Heat has helped   Review of Systems is noted in the HPI, as appropriate  Objective:   Blood pressure 110/68, pulse 80, temperature 97.7 F (36.5 C), temperature source Temporal, height 5\' 3"  (1.6 m), weight 144 lb (65.3 kg), last menstrual period 03/20/1981, SpO2 97 %.  GEN: No acute distress;  alert,appropriate. PULM: Breathing comfortably in no respiratory distress PSYCH: Normally interactive.   Range of motion at  the waist: Flexion, rotation and lateral bending: Flexion to 70 degrees.  Extension does cause some mild pain, lateral bending and rotational movements are intact.  No echymosis or edema Rises to examination table with no difficulty Gait: minimally antalgic  Inspection/Deformity: No abnormality Paraspinus T: Quite painful and rigid, left greater than right L2-S1.  B Ankle Dorsiflexion (L5,4): 5/5 B Great Toe Dorsiflexion (L5,4): 5/5 Heel Walk (L5): WNL Toe Walk (S1): WNL Rise/Squat (L4): WNL, mild pain  SENSORY B Medial Foot (L4): WNL B Dorsum (L5): WNL B Lateral (S1): Decreased to pinprick only Light Touch: WNL Pinprick: Left lateral lower extremity  REFLEXES Knee (L4): 2+ Ankle (S1): 2+  B SLR, seated: neg B SLR, supine: neg B FABER: neg B Reverse FABER: neg B Greater Troch: NT B Log Roll: neg B Sciatic Notch: Tender to palpation  Radiology: DG Lumbar Spine Complete  Result Date: 08/19/2020 CLINICAL DATA:  Acute lumbar pain EXAM: LUMBAR SPINE - COMPLETE 4+ VIEW COMPARISON:  None. FINDINGS: Grade 1 anterolisthesis at L4-5. No acute fracture. Disc spaces are maintained. Calcific aortic atherosclerosis. IMPRESSION: Grade 1 L4-5 anterolisthesis. No acute abnormality of the lumbar spine. Electronically Signed   By: Ulyses Jarred M.D.   On: 08/19/2020 02:53    Assessment and Plan:     ICD-10-CM   1. Acute left lumbar radiculopathy  M54.16 predniSONE (DELTASONE) 20 MG tablet    tiZANidine (ZANAFLEX) 4 MG  tablet    DG Lumbar Spine Complete  2. Numbness of left lower extremity  R20.0    Probable nerve encroachment with radiculopathy and left lower extremity numbness that is new.  Likely L4-5 versus L5-S1 and I Minna treat the patient with some steroids, very range of motion and basic, as well as some Zanaflex.  Her plain films have returned, and  they are basically pretty normal for age and with less pathology for age than expected.   Anatomy reviewed.   She is to follow-up with 3 weeks if she is not making good progress.  Meds ordered this encounter  Medications  . predniSONE (DELTASONE) 20 MG tablet    Sig: 2 tabs po daily for 5 days, then 1 tab po daily for 5 days    Dispense:  15 tablet    Refill:  0  . tiZANidine (ZANAFLEX) 4 MG tablet    Sig: Take 0.5-1 tablets (2-4 mg total) by mouth at bedtime as needed for muscle spasms.    Dispense:  30 tablet    Refill:  0   There are no discontinued medications. Orders Placed This Encounter  Procedures  . DG Lumbar Spine Complete    Signed,  Dilana Mcphie T. Emeli Goguen, MD   Outpatient Encounter Medications as of 08/18/2020  Medication Sig  . Alpha-Lipoic Acid 600 MG TABS Take 600 mg by mouth 2 (two) times daily. (Patient taking differently: Take 600 mg by mouth 2 (two) times daily. Pt taking as needed.)  . amLODipine (NORVASC) 5 MG tablet Take 1 tablet (5 mg total) by mouth daily.  . Dulaglutide (TRULICITY) 3 GY/6.5LD SOPN Inject 0.5 mLs (3 mg total) into the skin once a week. (Patient taking differently: Inject 3 mg into the skin every Saturday.)  . empagliflozin (JARDIANCE) 25 MG TABS tablet Take 1 tablet (25 mg total) by mouth daily before breakfast.  . ezetimibe (ZETIA) 10 MG tablet TAKE 1 TABLET DAILY  . fluticasone (FLONASE) 50 MCG/ACT nasal spray Place 2 sprays into both nostrils daily as needed. Allergies  . hydrochlorothiazide (HYDRODIURIL) 25 MG tablet Take 1 tablet (25 mg total) by mouth daily.  Marland Kitchen levothyroxine (SYNTHROID) 50 MCG tablet TAKE 1 TABLET DAILY BEFORE BREAKFAST  . losartan (COZAAR) 100 MG tablet Take 1 tablet (100 mg total) by mouth daily.  . Multiple Vitamin (MULTIVITAMIN WITH MINERALS) TABS tablet Take 1 tablet by mouth daily.  . naproxen sodium (ALEVE) 220 MG tablet Take 220 mg by mouth daily as needed (pain).  Marland Kitchen omeprazole (PRILOSEC) 20 MG capsule Take 1  capsule (20 mg total) by mouth daily.  . predniSONE (DELTASONE) 20 MG tablet 2 tabs po daily for 5 days, then 1 tab po daily for 5 days  . repaglinide (PRANDIN) 1 MG tablet Take 1 tablet (1 mg total) by mouth daily before supper.  Marland Kitchen rOPINIRole (REQUIP) 1 MG tablet TAKE 1 TABLET AT BEDTIME  . rosuvastatin (CRESTOR) 10 MG tablet TAKE 1 TABLET TWICE A WEEK  . sertraline (ZOLOFT) 100 MG tablet Take 1 tablet (100 mg total) by mouth at bedtime.  Marland Kitchen tiZANidine (ZANAFLEX) 4 MG tablet Take 0.5-1 tablets (2-4 mg total) by mouth at bedtime as needed for muscle spasms.  . [DISCONTINUED] cetirizine (ZYRTEC) 10 MG tablet Take 10 mg by mouth daily as needed. For allergy symptoms   No facility-administered encounter medications on file as of 08/18/2020.

## 2020-09-02 DIAGNOSIS — M5416 Radiculopathy, lumbar region: Secondary | ICD-10-CM

## 2020-09-02 DIAGNOSIS — R200 Anesthesia of skin: Secondary | ICD-10-CM

## 2020-09-02 MED ORDER — PREDNISONE 20 MG PO TABS: ORAL_TABLET | ORAL | 0 refills | Status: DC

## 2020-09-02 MED ORDER — TRAMADOL HCL 50 MG PO TABS: 50.0000 mg | ORAL_TABLET | Freq: Three times a day (TID) | ORAL | 0 refills | Status: DC | PRN

## 2020-09-02 NOTE — Telephone Encounter (Signed)
DG Lumbar Spine Complete  Result Date: 08/19/2020 CLINICAL DATA:  Acute lumbar pain EXAM: LUMBAR SPINE - COMPLETE 4+ VIEW COMPARISON:  None. FINDINGS: Grade 1 anterolisthesis at L4-5. No acute fracture. Disc spaces are maintained. Calcific aortic atherosclerosis. IMPRESSION: Grade 1 L4-5 anterolisthesis. No acute abnormality of the lumbar spine. Electronically Signed   By: Ulyses Jarred M.D.   On: 08/19/2020 02:53        ICD-10-CM   1. Acute left lumbar radiculopathy  M54.16 Ambulatory referral to Physical Therapy    2. Numbness of left lower extremity  R20.0 Ambulatory referral to Physical Therapy     Pred, tramadol, PT, f/u 3-4 weeks  Meds ordered this encounter  Medications   predniSONE (DELTASONE) 20 MG tablet    Sig: 2 tabs po for 7 days, then 1 tab po for 7 days    Dispense:  21 tablet    Refill:  0   traMADol (ULTRAM) 50 MG tablet    Sig: Take 1 tablet (50 mg total) by mouth every 8 (eight) hours as needed for moderate pain.    Dispense:  20 tablet    Refill:  0   Medications Discontinued During This Encounter  Medication Reason   predniSONE (DELTASONE) 20 MG tablet    Orders Placed This Encounter  Procedures   Ambulatory referral to Physical Therapy

## 2020-09-06 ENCOUNTER — Encounter (INDEPENDENT_AMBULATORY_CARE_PROVIDER_SITE_OTHER): Payer: Medicare Other | Admitting: Ophthalmology

## 2020-09-14 ENCOUNTER — Encounter (INDEPENDENT_AMBULATORY_CARE_PROVIDER_SITE_OTHER): Payer: Self-pay | Admitting: Ophthalmology

## 2020-09-14 ENCOUNTER — Ambulatory Visit (INDEPENDENT_AMBULATORY_CARE_PROVIDER_SITE_OTHER): Payer: Medicare Other | Admitting: Ophthalmology

## 2020-09-14 ENCOUNTER — Other Ambulatory Visit: Payer: Self-pay

## 2020-09-14 DIAGNOSIS — H43811 Vitreous degeneration, right eye: Secondary | ICD-10-CM

## 2020-09-14 DIAGNOSIS — H35352 Cystoid macular degeneration, left eye: Secondary | ICD-10-CM

## 2020-09-14 DIAGNOSIS — H35032 Hypertensive retinopathy, left eye: Secondary | ICD-10-CM | POA: Diagnosis not present

## 2020-09-14 DIAGNOSIS — H43392 Other vitreous opacities, left eye: Secondary | ICD-10-CM

## 2020-09-14 DIAGNOSIS — H34832 Tributary (branch) retinal vein occlusion, left eye, with macular edema: Secondary | ICD-10-CM | POA: Diagnosis not present

## 2020-09-14 NOTE — Assessment & Plan Note (Signed)
At 89-month follow-up today, no recurrence.  That is roughly 4 weeks off of therapy post Eylea injection OS

## 2020-09-14 NOTE — Assessment & Plan Note (Signed)
Stable no pathology

## 2020-09-14 NOTE — Progress Notes (Signed)
09/14/2020     CHIEF COMPLAINT Patient presents for Retina Follow Up (3 month fu OU and OCT/FP/Pt states, "I think I have more of the little black dots than I did OS but my va seems to be ok."/A1C: 6.2/LBS: 109)   HISTORY OF PRESENT ILLNESS: Valerie Ball is a 75 y.o. female who presents to the clinic today for:   HPI     Retina Follow Up           Diagnosis: CRVO/BRVO   Laterality: left eye   Onset: 3 months ago   Severity: mild   Duration: 3 months   Course: stable   Comments: 3 month fu OU and OCT/FP Pt states, "I think I have more of the little black dots than I did OS but my va seems to be ok." A1C: 6.2 LBS: 109       Last edited by Kendra Opitz, COA on 09/14/2020  8:07 AM.      Referring physician: Tower, Wynelle Fanny, MD [place],  [place] [address]  HISTORICAL INFORMATION:   Selected notes from the MEDICAL RECORD NUMBER    Lab Results  Component Value Date   HGBA1C 6.2 (A) 04/15/2020     CURRENT MEDICATIONS: No current outpatient medications on file. (Ophthalmic Drugs)   No current facility-administered medications for this visit. (Ophthalmic Drugs)   Current Outpatient Medications (Other)  Medication Sig   Alpha-Lipoic Acid 600 MG TABS Take 600 mg by mouth 2 (two) times daily. (Patient taking differently: Take 600 mg by mouth 2 (two) times daily. Pt taking as needed.)   amLODipine (NORVASC) 5 MG tablet Take 1 tablet (5 mg total) by mouth daily.   Dulaglutide (TRULICITY) 3 VO/5.3GU SOPN Inject 0.5 mLs (3 mg total) into the skin once a week. (Patient taking differently: Inject 3 mg into the skin every Saturday.)   empagliflozin (JARDIANCE) 25 MG TABS tablet Take 1 tablet (25 mg total) by mouth daily before breakfast.   ezetimibe (ZETIA) 10 MG tablet TAKE 1 TABLET DAILY   fluticasone (FLONASE) 50 MCG/ACT nasal spray Place 2 sprays into both nostrils daily as needed. Allergies   hydrochlorothiazide (HYDRODIURIL) 25 MG tablet Take 1  tablet (25 mg total) by mouth daily.   levothyroxine (SYNTHROID) 50 MCG tablet TAKE 1 TABLET DAILY BEFORE BREAKFAST   losartan (COZAAR) 100 MG tablet Take 1 tablet (100 mg total) by mouth daily.   Multiple Vitamin (MULTIVITAMIN WITH MINERALS) TABS tablet Take 1 tablet by mouth daily.   naproxen sodium (ALEVE) 220 MG tablet Take 220 mg by mouth daily as needed (pain).   omeprazole (PRILOSEC) 20 MG capsule Take 1 capsule (20 mg total) by mouth daily.   predniSONE (DELTASONE) 20 MG tablet 2 tabs po for 7 days, then 1 tab po for 7 days   repaglinide (PRANDIN) 1 MG tablet Take 1 tablet (1 mg total) by mouth daily before supper.   rOPINIRole (REQUIP) 1 MG tablet TAKE 1 TABLET AT BEDTIME   rosuvastatin (CRESTOR) 10 MG tablet TAKE 1 TABLET TWICE A WEEK   sertraline (ZOLOFT) 100 MG tablet Take 1 tablet (100 mg total) by mouth at bedtime.   tiZANidine (ZANAFLEX) 4 MG tablet Take 0.5-1 tablets (2-4 mg total) by mouth at bedtime as needed for muscle spasms.   traMADol (ULTRAM) 50 MG tablet Take 1 tablet (50 mg total) by mouth every 8 (eight) hours as needed for moderate pain.   No current facility-administered medications for  this visit. (Other)      REVIEW OF SYSTEMS:    ALLERGIES Allergies  Allergen Reactions   Arimidex [Anastrozole] Other (See Comments)    Joint pain   Glipizide Other (See Comments)    Hypoglycemia    Metformin And Related Other (See Comments)    diarrhea   Penicillins Hives and Rash    PAST MEDICAL HISTORY Past Medical History:  Diagnosis Date   Allergy    allegic rhinitis   Anemia    Angiodysplasia of colon    Anxiety    Arthritis    Blood transfusion    Borderline diabetic    per patient medical history form   Breast cancer ([place]) 02/2009   breast CA L invasive ductal CA(hormone receptor positive.)   Cataract    Colon polyp    hyperplastic   Colon stricture ([place]) 01/14/2003   Diabetes mellitus    pt's medical doctor took her off diabetic meds-blood  sugars have not been a problem--and medications were causing pt's sugars to be too low   Diverticulosis    Esophageal stricture 02/04/2008   GERD (gastroesophageal reflux disease)    GI bleed    Hepatic steatosis    Hiatal hernia    Hyperlipidemia    Hypertension    Hypothyroidism    IBS (irritable bowel syndrome)    Melena 01/2008   anemia transfusion   Menopause    per patient medical history form   Osteoporosis    Postmenopausal hormone therapy    per patient medical history form.   RLS (restless legs syndrome)    Salmonella 05/2000   renal effects secondary to dehydration   Seizure disorder ([place])    Seizures ([place])    one seizure several yrs ago --etiology unknown-no problem since   Past Surgical History:  Procedure Laterality Date   ABDOMINAL HYSTERECTOMY  1983   BREAST SURGERY  02/2009   breast biopsy invasive ductal CA-bilateral mastectomies   EPIGASTRIC HERNIA REPAIR  01/02/2012   Procedure: HERNIA REPAIR EPIGASTRIC ADULT;  Surgeon: Pedro Earls, MD;  Location: [facility];  Service: General;  Laterality: N/A;  Laparoscopic Repair Paraesophageal Hernia, [place]   EYE SURGERY Left 07/2017   Dr. Zadie Rhine   LAPAROSCOPIC NISSEN FUNDOPLICATION  35/59/7416   Procedure: LAPAROSCOPIC NISSEN FUNDOPLICATION;  Surgeon: Pedro Earls, MD;  Location: [facility];  Service: General;  Laterality: N/A;   MASTECTOMY  04/2009   Bilateral for ductal carcinoma on L   OVARY SURGERY  1986   ovarian tumor removed    FAMILY HISTORY Family History  Problem Relation Age of Onset   Heart attack Father    Diabetes Father    Esophageal cancer Father    Breast cancer Sister        x 2 sisters   Diabetes Sister    Hypertension Brother    Diabetes Brother    Hypertension Mother    Stroke Mother    Breast cancer Mother    Hypertension Sister     SOCIAL HISTORY Social History   Tobacco Use   Smoking status: Never   Smokeless tobacco: Never  Vaping Use   Vaping Use: Never used   Substance Use Topics   Alcohol use: No    Alcohol/week: 0.0 standard drinks   Drug use: No         OPHTHALMIC EXAM:  Base Eye Exam     Visual Acuity (ETDRS)       Right Left   Dist [name]  20/20 -2 20/200   Dist ph [place]  NI         Tonometry (Tonopen, 8:10 AM)       Right Left   Pressure 12 13         Pupils       Pupils Dark Light Shape React APD   Right PERRL 3 2 Round Brisk None   Left PERRL 3 2 Round Brisk None         Visual Fields (Counting fingers)       Left Right    Full Full         Extraocular Movement       Right Left    Full Full         Neuro/Psych     Oriented x3: Yes   Mood/Affect: Normal         Dilation     Both eyes: 1.0% Mydriacyl, 2.5% Phenylephrine @ 8:10 AM           Slit Lamp and Fundus Exam     External Exam       Right Left   External Normal Normal         Slit Lamp Exam       Right Left   Lids/Lashes Normal Normal   Conjunctiva/Sclera White and quiet White and quiet   Cornea Clear Clear   Anterior Chamber Deep and quiet Deep and quiet   Iris Round and reactive Round and reactive   Lens Posterior chamber intraocular lens Posterior chamber intraocular lens   Anterior Vitreous Normal Normal         Fundus Exam       Right Left   Posterior Vitreous Posterior vitreous detachment Central vitreous floaters   Disc Normal Normal, collaterals on nerve   C/D Ratio 0.4 0.50   Macula Normal Good Focal laser, Microaneurysms, no macular thickening, no cystoid macular edema   Vessels Normal, , no DR Macular branch retinal vein occlusion superotemporal.  No CME recurrence   Periphery Normal Normal            IMAGING AND PROCEDURES  Imaging and Procedures for 09/14/20  OCT, Retina - OU - Both Eyes       Right Eye Quality was good. Scan locations included subfoveal. Central Foveal Thickness: 251. Progression has been stable. Findings include abnormal foveal contour, no IRF, no SRF, retinal drusen  .   Left Eye Quality was good. Scan locations included subfoveal. Central Foveal Thickness: 236. Progression has been stable. Findings include abnormal foveal contour, myopic contour, no SRF, no IRF.   Notes OD, retinal drusen but no active CNVM  OS with history of BRVO with CME yet stable at this time with no recurrence  currently 12-week follow-up post Eylea, no recurrence in the region superior to the fovea left eye  OS will continue to monitor follow     Color Fundus Photography Optos - OU - Both Eyes       Right Eye Progression has been stable. Disc findings include normal observations. Macula : normal observations. Vessels : normal observations. Periphery : normal observations.   Left Eye Disc findings include normal observations. Periphery : normal observations.   Notes Old BRVO superiorly left eye, stable now CME resolved .  With central vitreous floaters  OD with incidental cilioretinal artery From the nerve               ASSESSMENT/PLAN:  Branch retinal vein occlusion with macular edema  of left eye At 61-month follow-up today, no recurrence.  That is roughly 4 weeks off of therapy post Eylea injection OS  Posterior vitreous detachment of right eye Stable no pathology  Vitreous floaters of left eye Central vitreous floaters, stable     ICD-10-CM   1. Branch retinal vein occlusion with macular edema of left eye  H34.8320 OCT, Retina - OU - Both Eyes    Color Fundus Photography Optos - OU - Both Eyes    2. Cystoid macular edema of left eye  H35.352 Color Fundus Photography Optos - OU - Both Eyes    3. Posterior vitreous detachment of right eye  H43.811 OCT, Retina - OU - Both Eyes    Color Fundus Photography Optos - OU - Both Eyes    4. Hypertensive retinopathy of left eye  H35.032     5. Vitreous floaters of left eye  H43.392       1.  BRVO with CME, currently 3 months post most recent injection Eylea, no recurrence of CME superior the fovea.   Essentially 4 weeks off therapy from [facility], will suggest and recommend follow-up next in 3 months and if stable will extend interval next time  2.  3.  Ophthalmic Meds Ordered this visit:  No orders of the defined types were placed in this encounter.      Return in about 3 months (around 12/15/2020) for DILATE OU, OCT.  There are no Patient Instructions on file for this visit.   Explained the diagnoses, plan, and follow up with the patient and they expressed understanding.  Patient expressed understanding of the importance of proper follow up care.   Clent Demark Jahmeek Shirk M.D. Diseases & Surgery of the Retina and Vitreous Retina & Diabetic [place] 09/14/20     Abbreviations: M myopia (nearsighted); A astigmatism; H hyperopia (farsighted); P presbyopia; Mrx spectacle prescription;  CTL contact lenses; OD right eye; OS left eye; OU both eyes  XT exotropia; ET esotropia; PEK punctate epithelial keratitis; PEE punctate epithelial erosions; DES dry eye syndrome; MGD meibomian gland dysfunction; ATs artificial tears; PFAT's preservative free artificial tears; [place] nuclear sclerotic cataract; PSC posterior subcapsular cataract; ERM epi-retinal membrane; PVD posterior vitreous detachment; RD retinal detachment; DM diabetes mellitus; DR diabetic retinopathy; NPDR non-proliferative diabetic retinopathy; PDR proliferative diabetic retinopathy; CSME clinically significant macular edema; DME diabetic macular edema; dbh dot blot hemorrhages; CWS cotton wool spot; POAG primary open angle glaucoma; C/D cup-to-disc ratio; HVF humphrey visual field; GVF goldmann visual field; OCT optical coherence tomography; IOP intraocular pressure; BRVO Branch retinal vein occlusion; CRVO central retinal vein occlusion; CRAO central retinal artery occlusion; BRAO branch retinal artery occlusion; RT retinal tear; SB scleral buckle; PPV pars plana vitrectomy; VH Vitreous hemorrhage; PRP panretinal laser photocoagulation; IVK  intravitreal kenalog; VMT vitreomacular traction; MH Macular hole;  NVD neovascularization of the disc; NVE neovascularization elsewhere; AREDS age related eye disease study; ARMD age related macular degeneration; POAG primary open angle glaucoma; EBMD epithelial/anterior basement membrane dystrophy; ACIOL anterior chamber intraocular lens; IOL intraocular lens; PCIOL posterior chamber intraocular lens; Phaco/IOL phacoemulsification with intraocular lens placement; [place] photorefractive keratectomy; LASIK laser assisted in situ keratomileusis; HTN hypertension; DM diabetes mellitus; COPD chronic obstructive pulmonary disease

## 2020-09-14 NOTE — Assessment & Plan Note (Signed)
Central vitreous floaters, stable

## 2020-09-17 ENCOUNTER — Other Ambulatory Visit: Payer: Self-pay | Admitting: Internal Medicine

## 2020-09-21 ENCOUNTER — Encounter: Payer: Self-pay | Admitting: Family Medicine

## 2020-09-24 ENCOUNTER — Other Ambulatory Visit: Payer: Self-pay

## 2020-09-24 ENCOUNTER — Encounter: Payer: Self-pay | Admitting: Family Medicine

## 2020-09-24 ENCOUNTER — Ambulatory Visit (INDEPENDENT_AMBULATORY_CARE_PROVIDER_SITE_OTHER)
Admission: RE | Admit: 2020-09-24 | Discharge: 2020-09-24 | Disposition: A | Discharge status: Home / Self Care | Payer: Medicare Other | Source: Ambulatory Visit | Attending: Family Medicine | Admitting: Family Medicine

## 2020-09-24 ENCOUNTER — Ambulatory Visit (INDEPENDENT_AMBULATORY_CARE_PROVIDER_SITE_OTHER): Payer: Medicare Other | Admitting: Family Medicine

## 2020-09-24 VITALS — BP 114/70 | HR 92 | Temp 97.8°F | Ht 63.0 in | Wt 142.4 lb

## 2020-09-24 DIAGNOSIS — R509 Fever, unspecified: Secondary | ICD-10-CM | POA: Diagnosis not present

## 2020-09-24 DIAGNOSIS — Z8616 Personal history of COVID-19: Secondary | ICD-10-CM | POA: Diagnosis not present

## 2020-09-24 DIAGNOSIS — R5382 Chronic fatigue, unspecified: Secondary | ICD-10-CM | POA: Diagnosis not present

## 2020-09-24 DIAGNOSIS — R5383 Other fatigue: Secondary | ICD-10-CM | POA: Diagnosis not present

## 2020-09-24 HISTORY — DX: Fever, unspecified: R50.9

## 2020-09-24 HISTORY — DX: Personal history of COVID-19: Z86.16

## 2020-09-24 LAB — CBC WITH DIFFERENTIAL/PLATELET
Absolute Monocytes: 370 cells/uL (ref 200–950)
Basophils Absolute: 19 cells/uL (ref 0–200)
Basophils Relative: 0.4 %
Eosinophils Absolute: 72 cells/uL (ref 15–500)
Eosinophils Relative: 1.5 %
HCT: 41.9 % (ref 35.0–45.0)
Hemoglobin: 13.2 g/dL (ref 11.7–15.5)
Lymphs Abs: 1118 cells/uL (ref 850–3900)
MCH: 26.7 pg — ABNORMAL LOW (ref 27.0–33.0)
MCHC: 31.5 g/dL — ABNORMAL LOW (ref 32.0–36.0)
MCV: 84.8 fL (ref 80.0–100.0)
MPV: 12 fL (ref 7.5–12.5)
Monocytes Relative: 7.7 %
Neutro Abs: 3221 cells/uL (ref 1500–7800)
Neutrophils Relative %: 67.1 %
Platelets: 104 10*3/uL — ABNORMAL LOW (ref 140–400)
RBC: 4.94 10*6/uL (ref 3.80–5.10)
RDW: 16 % — ABNORMAL HIGH (ref 11.0–15.0)
Total Lymphocyte: 23.3 %
WBC: 4.8 10*3/uL (ref 3.8–10.8)

## 2020-09-24 LAB — POC URINALSYSI DIPSTICK (AUTOMATED)
Bilirubin, UA: NEGATIVE
Blood, UA: NEGATIVE
Glucose, UA: POSITIVE — AB
Ketones, UA: NEGATIVE
Leukocytes, UA: NEGATIVE
Nitrite, UA: NEGATIVE
Protein, UA: POSITIVE — AB
Spec Grav, UA: >=1.03 — AB (ref 1.010–1.025)
Urobilinogen, UA: 0.2 E.U./dL
pH, UA: 6 (ref 5.0–8.0)

## 2020-09-24 NOTE — Progress Notes (Signed)
Subjective:    Patient ID: Valerie Ball, female    DOB: Apr 29, 1945, 75 y.o.   MRN: [id number]  This visit occurred during the SARS-CoV-2 public health emergency.  Safety protocols were in place, including screening questions prior to the visit, additional usage of staff PPE, and extensive cleaning of exam room while observing appropriate contact time as indicated for disinfecting solutions.   HPI Pt presents with low grade temp/night sweats after covid  Wt Readings from Last 3 Encounters:  09/24/20 142 lb 7 oz (64.6 kg)  08/18/20 144 lb (65.3 kg)  07/01/20 137 lb 8 oz (62.4 kg)   25.23 kg/m   Pt had covid last month, then tested negative on June 11  Still running low grade temp at night  Highest 100.4  Last night 99.6  Feels lousy  Does not sleep well   Covid imm with one booster   Pulse ox 96% RA Temp is 97.8 today   Not a lot of cough  Some phlegm-no color to it  No wheezing  No sob -just wears out   Occ small headache Throat is ok now  Ears ache just a bit  Some sinus swelling , not terribly congested  Has chronic nose drip  No colored mucous from nose   Had a ? Yeast infection  Now better  No dysuria or frequency    Very tired  Not as steady   Takes naproxen for fever    Patient Active Problem List   Diagnosis Date Noted   History of COVID-19 09/24/2020   Low grade fever 09/24/2020   Posterior vitreous detachment of right eye 11/06/2019   Cystoid macular edema of left eye 07/14/2019   Branch retinal vein occlusion with macular edema of left eye 07/14/2019   Hypertensive retinopathy of left eye 07/14/2019   Vitreous floaters of left eye 07/14/2019   Right foot pain 04/09/2019   Rectal bleeding 05/20/2018   Fatigue 05/20/2018   Hip pain, bilateral 08/31/2017   Vitamin B12 deficiency 09/21/2016   Benign neoplasm of tongue 04/11/2016   Diabetic gastroparesis ([place]) 03/27/2016   Retinal vein occlusion, branch 01/03/2016   Alternating  constipation and diarrhea 08/12/2015   AVM (arteriovenous malformation) of colon 05/18/2015   IBS (irritable bowel syndrome) 02/28/2015   Diverticulitis of colon 10/14/2014   Gout 07/22/2014   Hypothyroidism 04/23/2014   Nausea without vomiting 02/18/2014   Encounter for Medicare annual wellness exam 01/23/2014   Estrogen deficiency 01/23/2014   Colon cancer screening 01/23/2014   Tremor 01/20/2014   Allergic rhinitis 01/07/2014   Thrombocytopenia ([place]) 11/25/2013   Diabetic neuropathy ([place]) 11/25/2013   Lap Nissen October 2013 01/19/2012   Large type III mixed hiatus hernia  12/06/2011   Stress reaction 10/25/2011   Routine general medical examination at a health care facility 07/11/2011   TRANSAMINASES, SERUM, ELEVATED 11/11/2009   History of breast cancer 08/02/2009   DIVERTICULOSIS-COLON 03/03/2008   ESOPHAGEAL STRICTURE 03/02/2008   HIATAL HERNIA 03/02/2008   PERITONEAL ADHESIONS 03/02/2008   Anemia 02/07/2008   Poorly controlled type 2 diabetes mellitus with neuropathy ([place]) 08/19/2007   GAD (generalized anxiety disorder) 04/15/2007   DISORDERS, ORGANIC SLEEP NEC 08/23/2006   SYNDROME, RESTLESS LEGS 08/23/2006   SYNDROME, CARPAL TUNNEL 08/23/2006   Hyperlipidemia associated with type 2 diabetes mellitus ([place]) 08/22/2006   Essential hypertension 08/22/2006   ALLERGIC RHINITIS 08/22/2006   GERD 08/22/2006   Fatty liver 08/22/2006   History of seizure 08/22/2006   Past Medical History:  Diagnosis Date   Allergy    allegic rhinitis   Anemia    Angiodysplasia of colon    Anxiety    Arthritis    Blood transfusion    Borderline diabetic    per patient medical history form   Breast cancer ([place]) 02/2009   breast CA L invasive ductal CA(hormone receptor positive.)   Cataract    Colon polyp    hyperplastic   Colon stricture ([place]) 01/14/2003   Diabetes mellitus    pt's medical doctor took her off diabetic meds-blood sugars have not been a problem--and medications were  causing pt's sugars to be too low   Diverticulosis    Esophageal stricture 02/04/2008   GERD (gastroesophageal reflux disease)    GI bleed    Hepatic steatosis    Hiatal hernia    Hyperlipidemia    Hypertension    Hypothyroidism    IBS (irritable bowel syndrome)    Melena 01/2008   anemia transfusion   Menopause    per patient medical history form   Osteoporosis    Postmenopausal hormone therapy    per patient medical history form.   RLS (restless legs syndrome)    Salmonella 05/2000   renal effects secondary to dehydration   Seizure disorder ([place])    Seizures ([place])    one seizure several yrs ago --etiology unknown-no problem since   Past Surgical History:  Procedure Laterality Date   ABDOMINAL HYSTERECTOMY  1983   BREAST SURGERY  02/2009   breast biopsy invasive ductal CA-bilateral mastectomies   EPIGASTRIC HERNIA REPAIR  01/02/2012   Procedure: HERNIA REPAIR EPIGASTRIC ADULT;  Surgeon: Pedro Earls, MD;  Location: [facility];  Service: General;  Laterality: N/A;  Laparoscopic Repair Paraesophageal Hernia, Nissen   EYE SURGERY Left 07/2017   Dr. Zadie Rhine   LAPAROSCOPIC NISSEN FUNDOPLICATION  04/88/8916   Procedure: LAPAROSCOPIC NISSEN FUNDOPLICATION;  Surgeon: Pedro Earls, MD;  Location: [facility];  Service: General;  Laterality: N/A;   MASTECTOMY  04/2009   Bilateral for ductal carcinoma on L   OVARY SURGERY  1986   ovarian tumor removed   Social History   Tobacco Use   Smoking status: Never   Smokeless tobacco: Never  Vaping Use   Vaping Use: Never used  Substance Use Topics   Alcohol use: No    Alcohol/week: 0.0 standard drinks   Drug use: No   Family History  Problem Relation Age of Onset   Heart attack Father    Diabetes Father    Esophageal cancer Father    Breast cancer Sister        x 2 sisters   Diabetes Sister    Hypertension Brother    Diabetes Brother    Hypertension Mother    Stroke Mother    Breast cancer Mother    Hypertension Sister     Allergies  Allergen Reactions   Arimidex [Anastrozole] Other (See Comments)    Joint pain   Glipizide Other (See Comments)    Hypoglycemia    Metformin And Related Other (See Comments)    diarrhea   Penicillins Hives and Rash   Current Outpatient Medications on File Prior to Visit  Medication Sig Dispense Refill   Alpha-Lipoic Acid 600 MG TABS Take 600 mg by mouth 2 (two) times daily. (Patient taking differently: Take 600 mg by mouth 2 (two) times daily. Pt taking as needed.) 180 tablet 3   amLODipine (NORVASC) 5 MG tablet Take 1 tablet (5 mg  total) by mouth daily. 90 tablet 3   empagliflozin (JARDIANCE) 25 MG TABS tablet Take 1 tablet (25 mg total) by mouth daily before breakfast. 90 tablet 3   ezetimibe (ZETIA) 10 MG tablet TAKE 1 TABLET DAILY 90 tablet 1   fluticasone (FLONASE) 50 MCG/ACT nasal spray Place 2 sprays into both nostrils daily as needed. Allergies 48 g 3   hydrochlorothiazide (HYDRODIURIL) 25 MG tablet Take 1 tablet (25 mg total) by mouth daily. 90 tablet 3   levothyroxine (SYNTHROID) 50 MCG tablet TAKE 1 TABLET DAILY BEFORE BREAKFAST 90 tablet 3   losartan (COZAAR) 100 MG tablet Take 1 tablet (100 mg total) by mouth daily. 90 tablet 3   Multiple Vitamin (MULTIVITAMIN WITH MINERALS) TABS tablet Take 1 tablet by mouth daily.     naproxen sodium (ALEVE) 220 MG tablet Take 220 mg by mouth daily as needed (pain).     omeprazole (PRILOSEC) 20 MG capsule Take 1 capsule (20 mg total) by mouth daily. 90 capsule 3   repaglinide (PRANDIN) 1 MG tablet Take 1 tablet (1 mg total) by mouth daily before supper. 90 tablet 3   rOPINIRole (REQUIP) 1 MG tablet TAKE 1 TABLET AT BEDTIME 90 tablet 3   rosuvastatin (CRESTOR) 10 MG tablet TAKE 1 TABLET TWICE A WEEK 24 tablet 3   sertraline (ZOLOFT) 100 MG tablet Take 1 tablet (100 mg total) by mouth at bedtime. 90 tablet 3   tiZANidine (ZANAFLEX) 4 MG tablet Take 0.5-1 tablets (2-4 mg total) by mouth at bedtime as needed for muscle spasms. 30  tablet 0   TRULICITY 3 HQ/4.6NG SOPN INJECT 0.5 ML (3 MG) UNDER THE SKIN ONCE A WEEK 6 mL 3   [DISCONTINUED] cetirizine (ZYRTEC) 10 MG tablet Take 10 mg by mouth daily as needed. For allergy symptoms     No current facility-administered medications on file prior to visit.    Review of Systems  Constitutional:  Positive for fatigue. Negative for activity change, appetite change, fever and unexpected weight change.  HENT:  Positive for ear pain and postnasal drip. Negative for congestion, facial swelling, rhinorrhea, sinus pressure and sore throat.        Ears feel full  Eyes:  Negative for pain, redness and visual disturbance.  Respiratory:  Positive for cough. Negative for shortness of breath, wheezing and stridor.   Cardiovascular:  Negative for chest pain and palpitations.  Gastrointestinal:  Negative for abdominal pain, blood in stool, constipation and diarrhea.  Endocrine: Negative for polydipsia and polyuria.  Genitourinary:  Negative for dysuria, frequency and urgency.  Musculoskeletal:  Negative for arthralgias, back pain and myalgias.  Skin:  Negative for pallor and rash.  Allergic/Immunologic: Negative for environmental allergies.  Neurological:  Negative for dizziness, syncope and headaches.  Hematological:  Negative for adenopathy. Does not bruise/bleed easily.  Psychiatric/Behavioral:  Negative for decreased concentration and dysphoric mood. The patient is not nervous/anxious.       Objective:   Physical Exam Constitutional:      General: She is not in acute distress.    Appearance: Normal appearance. She is well-developed and normal weight. She is not ill-appearing or diaphoretic.  HENT:     Head: Normocephalic and atraumatic.     Comments: No facial tenderness    Right Ear: Tympanic membrane, ear canal and external ear normal.     Left Ear: Tympanic membrane, ear canal and external ear normal.     Nose: Nose normal.     Mouth/Throat:  Mouth: Mucous membranes are  moist.     Pharynx: Oropharynx is clear. No oropharyngeal exudate or posterior oropharyngeal erythema.  Eyes:     General: No scleral icterus.       Right eye: No discharge.        Left eye: No discharge.     Conjunctiva/sclera: Conjunctivae normal.     Pupils: Pupils are equal, round, and reactive to light.  Neck:     Thyroid: No thyromegaly.     Vascular: No carotid bruit or JVD.  Cardiovascular:     Rate and Rhythm: Normal rate and regular rhythm.     Heart sounds: Normal heart sounds.    No gallop.  Pulmonary:     Effort: Pulmonary effort is normal. No respiratory distress.     Breath sounds: Normal breath sounds. No wheezing or rales.  Abdominal:     General: Bowel sounds are normal. There is no distension or abdominal bruit.     Palpations: Abdomen is soft. There is no mass.     Tenderness: There is no abdominal tenderness.  Musculoskeletal:     Cervical back: Normal range of motion and neck supple.     Right lower leg: No edema.     Left lower leg: No edema.  Lymphadenopathy:     Cervical: No cervical adenopathy.  Skin:    General: Skin is warm and dry.     Coloration: Skin is not pale.     Findings: No erythema or rash.  Neurological:     Mental Status: She is alert.     Coordination: Coordination normal.     Deep Tendon Reflexes: Reflexes are normal and symmetric. Reflexes normal.  Psychiatric:        Mood and Affect: Mood normal.        Cognition and Memory: Cognition and memory normal.          Assessment & Plan:   Problem List Items Addressed This Visit       Other   Fatigue    Post covid fatigue and also low grade temp persists  Reassuring exam cxr and labs today with ua Disc post covid syndrome  Enc rest and fluids, gradually inc activity as tolerated         Relevant Orders   DG Chest 2 View (Completed)   CBC with Differential/Platelet (Completed)   POCT Urinalysis Dipstick (Automated) (Completed)   History of COVID-19    Now some post  covid symptoms with fatigue and low grade temp  Lab, ua, cxr today       Low grade fever - Primary    S/p covid with fatigue but not a lot of other symptoms  Reassuring exam   Ua, lab and CXR today  Disc tx fever with nsaid as needed Keep up fluids an drest       Relevant Orders   DG Chest 2 View (Completed)   CBC with Differential/Platelet (Completed)   POCT Urinalysis Dipstick (Automated) (Completed)

## 2020-09-24 NOTE — Patient Instructions (Addendum)
Keep drinking fluids  Keep resting   Naproxen prn  Continue watching temp   Cxr and urinalysis and labs now   We will contact you with a result

## 2020-09-25 NOTE — Assessment & Plan Note (Signed)
Post covid fatigue and also low grade temp persists  Reassuring exam cxr and labs today with ua Disc post covid syndrome  Enc rest and fluids, gradually inc activity as tolerated

## 2020-09-25 NOTE — Assessment & Plan Note (Signed)
Now some post covid symptoms with fatigue and low grade temp  Lab, ua, cxr today

## 2020-09-25 NOTE — Assessment & Plan Note (Signed)
S/p covid with fatigue but not a lot of other symptoms  Reassuring exam   Ua, lab and CXR today  Disc tx fever with nsaid as needed Keep up fluids an drest

## 2020-09-27 ENCOUNTER — Other Ambulatory Visit: Payer: Self-pay | Admitting: *Deleted

## 2020-09-27 DIAGNOSIS — M5416 Radiculopathy, lumbar region: Secondary | ICD-10-CM

## 2020-09-27 MED ORDER — TIZANIDINE HCL 4 MG PO TABS: 2.0000 mg | ORAL_TABLET | Freq: Every evening | ORAL | 0 refills | Status: DC | PRN

## 2020-09-27 NOTE — Telephone Encounter (Signed)
Last office visit 09/24/2020 for low grade fever with Dr. Glori Bickers.  Last refilled 08/18/2020 for #30 with no refills by Dr. Lorelei Pont.  Will send refill to Dr. Lorelei Pont since Dr. Glori Bickers is out of office this week.  Pharmacy is requesting 90 day supply.

## 2020-09-28 ENCOUNTER — Encounter: Payer: Self-pay | Admitting: Family Medicine

## 2020-09-28 NOTE — Telephone Encounter (Signed)
Message sent letting PCP know pt's comments

## 2020-09-29 ENCOUNTER — Ambulatory Visit (INDEPENDENT_AMBULATORY_CARE_PROVIDER_SITE_OTHER): Payer: Medicare Other | Admitting: Nurse Practitioner

## 2020-09-29 VITALS — BP 98/64 | HR 91 | Temp 98.8°F | Resp 16

## 2020-09-29 DIAGNOSIS — G933 Postviral fatigue syndrome: Secondary | ICD-10-CM

## 2020-09-29 DIAGNOSIS — G9331 Postviral fatigue syndrome: Secondary | ICD-10-CM | POA: Insufficient documentation

## 2020-09-29 DIAGNOSIS — Z8616 Personal history of COVID-19: Secondary | ICD-10-CM

## 2020-09-29 MED ORDER — AZITHROMYCIN 250 MG PO TABS: ORAL_TABLET | ORAL | 0 refills | Status: AC

## 2020-09-29 NOTE — Progress Notes (Signed)
@Patient  ID: Valerie Ball, female    DOB: 1946-02-28, 75 y.o.   MRN: [id number]  Chief Complaint  Patient presents with   history of covid     Referring provider: Tower, Wynelle Fanny, MD   HPI  Patient presents today for post-COVID care clinic visit.  Patient states that she tested positive for COVID in the beginning of June 2022.  She also had COVID in December 2021.  She states that she continues to have weakness, fatigue, low-grade fever of 99 F at night, and feels unsteady.  She states that she has been trying to eat and stay well-hydrated.  She was prescribed 2 rounds of prednisone for an orthopedic condition at the beginning of June.  Recent lab work and chest x-ray were normal.  Patient states that she is diabetic. Denies f/c/s, n/v/d, hemoptysis, PND, chest pain or edema.    Allergies  Allergen Reactions   Arimidex [Anastrozole] Other (See Comments)    Joint pain   Glipizide Other (See Comments)    Hypoglycemia    Metformin And Related Other (See Comments)    diarrhea   Penicillins Hives and Rash    Immunization History  Administered Date(s) Administered   Fluad Quad(high Dose 65+) 11/19/2018   Influenza Split 01/17/2011   Influenza Whole 04/04/2005, 01/22/2009   Influenza, High Dose Seasonal PF 01/10/2018, 01/02/2020   Influenza,inj,Quad PF,6+ Mos 12/06/2012, 11/25/2013, 01/26/2015, 11/12/2015, 03/29/2017   Influenza-Unspecified 12/19/2019   PFIZER(Purple Top)SARS-COV-2 Vaccination 04/21/2019, 05/19/2019, 12/19/2019   Pneumococcal Conjugate-13 01/26/2015   Pneumococcal Polysaccharide-23 02/10/2008, 01/23/2014   Tdap 07/19/2011   Zoster Recombinat (Shingrix) 01/10/2018, 04/21/2018    Past Medical History:  Diagnosis Date   Allergy    allegic rhinitis   Anemia    Angiodysplasia of colon    Anxiety    Arthritis    Blood transfusion    Borderline diabetic    per patient medical history form   Breast cancer ([place]) 02/2009   breast CA L invasive ductal  CA(hormone receptor positive.)   Cataract    Colon polyp    hyperplastic   Colon stricture ([place]) 01/14/2003   Diabetes mellitus    pt's medical doctor took her off diabetic meds-blood sugars have not been a problem--and medications were causing pt's sugars to be too low   Diverticulosis    Esophageal stricture 02/04/2008   GERD (gastroesophageal reflux disease)    GI bleed    Hepatic steatosis    Hiatal hernia    Hyperlipidemia    Hypertension    Hypothyroidism    IBS (irritable bowel syndrome)    Melena 01/2008   anemia transfusion   Menopause    per patient medical history form   Osteoporosis    Postmenopausal hormone therapy    per patient medical history form.   RLS (restless legs syndrome)    Salmonella 05/2000   renal effects secondary to dehydration   Seizure disorder ([facility])    [place]izures [place])    one seizure several yrs ago --etiology unknown-no problem since    Tobacco History: Social History   Tobacco Use  Smoking Status Never  Smokeless Tobacco Never   Counseling given: Yes   Outpatient Encounter Medications as of 09/29/2020  Medication Sig   azithromycin (ZITHROMAX) 250 MG tablet Take 2 tablets on day 1, then 1 tablet daily on days 2 through 5   Alpha-Lipoic Acid 600 MG TABS Take 600 mg by mouth 2 (two) times daily. (Patient taking differently: Take 600 mg by  mouth 2 (two) times daily. Pt taking as needed.)   amLODipine (NORVASC) 5 MG tablet Take 1 tablet (5 mg total) by mouth daily.   empagliflozin (JARDIANCE) 25 MG TABS tablet Take 1 tablet (25 mg total) by mouth daily before breakfast.   ezetimibe (ZETIA) 10 MG tablet TAKE 1 TABLET DAILY   fluticasone (FLONASE) 50 MCG/ACT nasal spray Place 2 sprays into both nostrils daily as needed. Allergies   hydrochlorothiazide (HYDRODIURIL) 25 MG tablet Take 1 tablet (25 mg total) by mouth daily.   levothyroxine (SYNTHROID) 50 MCG tablet TAKE 1 TABLET DAILY BEFORE BREAKFAST   losartan (COZAAR) 100 MG tablet Take 1  tablet (100 mg total) by mouth daily.   Multiple Vitamin (MULTIVITAMIN WITH MINERALS) TABS tablet Take 1 tablet by mouth daily.   naproxen sodium (ALEVE) 220 MG tablet Take 220 mg by mouth daily as needed (pain).   omeprazole (PRILOSEC) 20 MG capsule Take 1 capsule (20 mg total) by mouth daily.   repaglinide (PRANDIN) 1 MG tablet Take 1 tablet (1 mg total) by mouth daily before supper.   rOPINIRole (REQUIP) 1 MG tablet TAKE 1 TABLET AT BEDTIME   rosuvastatin (CRESTOR) 10 MG tablet TAKE 1 TABLET TWICE A WEEK   sertraline (ZOLOFT) 100 MG tablet Take 1 tablet (100 mg total) by mouth at bedtime.   tiZANidine (ZANAFLEX) 4 MG tablet Take 0.5-1 tablets (2-4 mg total) by mouth at bedtime as needed for muscle spasms.   TRULICITY 3 MG/8.6PY SOPN INJECT 0.5 ML (3 MG) UNDER THE SKIN ONCE A WEEK   [DISCONTINUED] cetirizine (ZYRTEC) 10 MG tablet Take 10 mg by mouth daily as needed. For allergy symptoms   No facility-administered encounter medications on file as of 09/29/2020.     Review of Systems  Review of Systems  Constitutional:  Positive for fatigue and fever.  HENT: Negative.    Respiratory:  Negative for cough and shortness of breath.   Cardiovascular: Negative.  Negative for chest pain, palpitations and leg swelling.  Gastrointestinal: Negative.   Musculoskeletal:  Positive for gait problem.  Allergic/Immunologic: Negative.   Psychiatric/Behavioral: Negative.        Physical Exam  BP 98/64   Pulse 91   Temp 98.8 F (37.1 C)   Resp 16   LMP 03/20/1981   SpO2 96%   Wt Readings from Last 5 Encounters:  09/24/20 142 lb 7 oz (64.6 kg)  08/18/20 144 lb (65.3 kg)  07/01/20 137 lb 8 oz (62.4 kg)  04/15/20 136 lb 9.6 oz (62 kg)  04/01/20 139 lb 7 oz (63.2 kg)     Physical Exam Vitals and nursing note reviewed.  Constitutional:      General: She is not in acute distress.    Appearance: She is well-developed.  Cardiovascular:     Rate and Rhythm: Normal rate and regular rhythm.   Pulmonary:     Effort: Pulmonary effort is normal.     Breath sounds: Normal breath sounds.  Neurological:     Mental Status: She is alert and oriented to person, place, and time.        Assessment & Plan:   History of COVID-19 Fatigue:   Stay well hydrated  Stay active  Deep breathing exercises  May start vitamin C mg daily, vitamin D3 daily, Zinc daily  May take tylenol for fever or pain  Will trial azithromycin  6 small meals each day     Follow up:  Follow up in 2 weeks or sooner if  needed - may consider PT     Fenton Foy, NP 09/29/2020

## 2020-09-29 NOTE — Assessment & Plan Note (Signed)
Fatigue:   Stay well hydrated  Stay active  Deep breathing exercises  May start vitamin C mg daily, vitamin D3 daily, Zinc daily  May take tylenol for fever or pain  Will trial azithromycin  6 small meals each day     Follow up:  Follow up in 2 weeks or sooner if needed - may consider PT

## 2020-09-29 NOTE — Patient Instructions (Addendum)
Covid 19 Fatigue:   Stay well hydrated  Stay active  Deep breathing exercises  May start vitamin C mg daily, vitamin D3 daily, Zinc daily  May take tylenol for fever or pain  Will trial azithromycin  6 small meals each day     Follow up:  Follow up in 2 weeks or sooner if needed - may consider PT

## 2020-09-30 ENCOUNTER — Other Ambulatory Visit (INDEPENDENT_AMBULATORY_CARE_PROVIDER_SITE_OTHER): Payer: Medicare Other

## 2020-09-30 ENCOUNTER — Other Ambulatory Visit: Payer: Self-pay

## 2020-09-30 DIAGNOSIS — D696 Thrombocytopenia, unspecified: Secondary | ICD-10-CM

## 2020-09-30 NOTE — Addendum Note (Signed)
Addended by: Cloyd Stagers on: 09/30/2020 08:15 AM   Modules accepted: Orders

## 2020-10-01 LAB — CBC WITH DIFFERENTIAL/PLATELET
Absolute Monocytes: 377 cells/uL (ref 200–950)
Basophils Absolute: 20 cells/uL (ref 0–200)
Basophils Relative: 0.4 %
Eosinophils Absolute: 69 cells/uL (ref 15–500)
Eosinophils Relative: 1.4 %
HCT: 42.7 % (ref 35.0–45.0)
Hemoglobin: 13.5 g/dL (ref 11.7–15.5)
Lymphs Abs: 1627 cells/uL (ref 850–3900)
MCH: 27 pg (ref 27.0–33.0)
MCHC: 31.6 g/dL — ABNORMAL LOW (ref 32.0–36.0)
MCV: 85.4 fL (ref 80.0–100.0)
MPV: 11.1 fL (ref 7.5–12.5)
Monocytes Relative: 7.7 %
Neutro Abs: 2808 cells/uL (ref 1500–7800)
Neutrophils Relative %: 57.3 %
Platelets: 104 10*3/uL — ABNORMAL LOW (ref 140–400)
RBC: 5 10*6/uL (ref 3.80–5.10)
RDW: 16.2 % — ABNORMAL HIGH (ref 11.0–15.0)
Total Lymphocyte: 33.2 %
WBC: 4.9 10*3/uL (ref 3.8–10.8)

## 2020-10-01 LAB — PATHOLOGIST SMEAR REVIEW

## 2020-10-05 ENCOUNTER — Telehealth: Payer: Self-pay | Admitting: Family Medicine

## 2020-10-05 DIAGNOSIS — D696 Thrombocytopenia, unspecified: Secondary | ICD-10-CM

## 2020-10-05 NOTE — Telephone Encounter (Signed)
-----   Message from Tammi Sou, [place] sent at 10/05/2020  4:37 PM EDT ----- Pt notified of lab results and Dr. Marliss Coots comments. Pt hasn't had any abnormal bruising or bleeding. Pt agrees with hematologist referral she would like to see someone in [place] if possible. I advise pt PCP will put referral and and she will get a f/u call

## 2020-10-07 ENCOUNTER — Encounter: Payer: Self-pay | Admitting: Family Medicine

## 2020-10-08 ENCOUNTER — Other Ambulatory Visit: Payer: Self-pay | Admitting: Family Medicine

## 2020-10-13 ENCOUNTER — Ambulatory Visit: Payer: TRICARE For Life (TFL)

## 2020-10-14 NOTE — Telephone Encounter (Signed)
Good Evening,   I see that you are scheduled with Dr Astrid Divine on 10/19/20 at [facility] ( they follow Hematologist patients as well)  Have they called you regarding this being scheduled?   --Varney Daily, [place] Referral Coordinator

## 2020-10-19 ENCOUNTER — Inpatient Hospital Stay: Payer: Medicare Other | Attending: Oncology | Admitting: Oncology

## 2020-10-19 ENCOUNTER — Inpatient Hospital Stay: Payer: Medicare Other

## 2020-10-19 VITALS — BP 116/68 | HR 92 | Temp 98.0°F | Resp 20 | Wt 142.3 lb

## 2020-10-19 DIAGNOSIS — D693 Immune thrombocytopenic purpura: Secondary | ICD-10-CM | POA: Diagnosis not present

## 2020-10-19 DIAGNOSIS — Z880 Allergy status to penicillin: Secondary | ICD-10-CM | POA: Diagnosis not present

## 2020-10-19 DIAGNOSIS — G933 Postviral fatigue syndrome: Secondary | ICD-10-CM | POA: Diagnosis not present

## 2020-10-19 DIAGNOSIS — H43811 Vitreous degeneration, right eye: Secondary | ICD-10-CM | POA: Diagnosis not present

## 2020-10-19 DIAGNOSIS — R5383 Other fatigue: Secondary | ICD-10-CM

## 2020-10-19 DIAGNOSIS — E119 Type 2 diabetes mellitus without complications: Secondary | ICD-10-CM

## 2020-10-19 DIAGNOSIS — E785 Hyperlipidemia, unspecified: Secondary | ICD-10-CM | POA: Diagnosis not present

## 2020-10-19 DIAGNOSIS — I10 Essential (primary) hypertension: Secondary | ICD-10-CM

## 2020-10-19 DIAGNOSIS — Z8249 Family history of ischemic heart disease and other diseases of the circulatory system: Secondary | ICD-10-CM

## 2020-10-19 DIAGNOSIS — H43392 Other vitreous opacities, left eye: Secondary | ICD-10-CM | POA: Diagnosis not present

## 2020-10-19 DIAGNOSIS — Z823 Family history of stroke: Secondary | ICD-10-CM

## 2020-10-19 DIAGNOSIS — Z803 Family history of malignant neoplasm of breast: Secondary | ICD-10-CM | POA: Diagnosis not present

## 2020-10-19 DIAGNOSIS — E039 Hypothyroidism, unspecified: Secondary | ICD-10-CM | POA: Diagnosis not present

## 2020-10-19 DIAGNOSIS — Z79899 Other long term (current) drug therapy: Secondary | ICD-10-CM | POA: Diagnosis not present

## 2020-10-19 DIAGNOSIS — Z9013 Acquired absence of bilateral breasts and nipples: Secondary | ICD-10-CM

## 2020-10-19 DIAGNOSIS — Z8719 Personal history of other diseases of the digestive system: Secondary | ICD-10-CM | POA: Diagnosis not present

## 2020-10-19 DIAGNOSIS — Z853 Personal history of malignant neoplasm of breast: Secondary | ICD-10-CM

## 2020-10-19 DIAGNOSIS — D696 Thrombocytopenia, unspecified: Secondary | ICD-10-CM

## 2020-10-19 DIAGNOSIS — Z808 Family history of malignant neoplasm of other organs or systems: Secondary | ICD-10-CM | POA: Diagnosis not present

## 2020-10-19 DIAGNOSIS — Z833 Family history of diabetes mellitus: Secondary | ICD-10-CM | POA: Diagnosis not present

## 2020-10-19 LAB — COMPREHENSIVE METABOLIC PANEL
ALT: 22 U/L (ref 0–44)
AST: 34 U/L (ref 15–41)
Albumin: 4.7 g/dL (ref 3.5–5.0)
Alkaline Phosphatase: 85 U/L (ref 38–126)
Anion gap: 10 (ref 5–15)
BUN: 26 mg/dL — ABNORMAL HIGH (ref 8–23)
CO2: 29 mmol/L (ref 22–32)
Calcium: 9.4 mg/dL (ref 8.9–10.3)
Chloride: 100 mmol/L (ref 98–111)
Creatinine, Ser: 0.95 mg/dL (ref 0.44–1.00)
GFR, Estimated: >60 mL/min (ref >60)
Glucose, Bld: 148 mg/dL — ABNORMAL HIGH (ref 70–99)
Potassium: 3.9 mmol/L (ref 3.5–5.1)
Sodium: 139 mmol/L (ref 135–145)
Total Bilirubin: 0.9 mg/dL (ref 0.3–1.2)
Total Protein: 8.4 g/dL — ABNORMAL HIGH (ref 6.5–8.1)

## 2020-10-19 LAB — CBC WITH DIFFERENTIAL/PLATELET
Abs Immature Granulocytes: 0.01 10*3/uL (ref 0.00–0.07)
Basophils Absolute: 0 10*3/uL (ref 0.0–0.1)
Basophils Relative: 0 %
Eosinophils Absolute: 0.1 10*3/uL (ref 0.0–0.5)
Eosinophils Relative: 1 %
HCT: 43.6 % (ref 36.0–46.0)
Hemoglobin: 13.9 g/dL (ref 12.0–15.0)
Immature Granulocytes: 0 %
Lymphocytes Relative: 22 %
Lymphs Abs: 1 10*3/uL (ref 0.7–4.0)
MCH: 27.6 pg (ref 26.0–34.0)
MCHC: 31.9 g/dL (ref 30.0–36.0)
MCV: 86.7 fL (ref 80.0–100.0)
Monocytes Absolute: 0.3 10*3/uL (ref 0.1–1.0)
Monocytes Relative: 6 %
Neutro Abs: 3.2 10*3/uL (ref 1.7–7.7)
Neutrophils Relative %: 71 %
Platelets: 100 10*3/uL — ABNORMAL LOW (ref 150–400)
RBC: 5.03 MIL/uL (ref 3.87–5.11)
RDW: 15.4 % (ref 11.5–15.5)
WBC: 4.6 10*3/uL (ref 4.0–10.5)
nRBC: 0 % (ref 0.0–0.2)

## 2020-10-19 LAB — IRON AND TIBC
Iron: 80 ug/dL (ref 28–170)
Saturation Ratios: 15 % (ref 10.4–31.8)
TIBC: 529 ug/dL — ABNORMAL HIGH (ref 250–450)
UIBC: 449 ug/dL

## 2020-10-19 LAB — FOLATE: Folate: 26 ng/mL (ref >5.9)

## 2020-10-19 LAB — VITAMIN B12: Vitamin B-12: 398 pg/mL (ref 180–914)

## 2020-10-19 LAB — FERRITIN: Ferritin: 20 ng/mL (ref 11–307)

## 2020-10-19 NOTE — Progress Notes (Signed)
Hematology/Oncology Consult note [facility] [phone] [phone]  Patient Care Team: Abner Greenspan, MD as PCP - General   Name of the patient: Valerie Ball  [id number]  [birth year]    Reason for referral: Thrombocytopenia   Referring physician-Dr. Glori Bickers  Date of visit: 10/23/20   History of presenting illness- patient is a 75 year old female with a past medical history significant for Hypertension hyperlipidemia hypothyroidism type 2 diabetes among other medical problems.  She has been referred to [place] for thrombocytopenia.  Her most recent CBC from 09/30/2020 showed white count of 4.9, H&H of 13.5/42.7 and a platelet count of 104.  Looking back at her prior CBCs patient has always had mild isolated thrombocytopenia at least dating back to 2016.  Back in 2016 her platelet counts were more in the 130s and then slowly started drifting down to the 120s and presently between 100s to 110s.  She denies any significant bleeding or bruising.  Appetite and weight have remained stable.  ECOG PS- 1  Pain scale- 0   Review of systems- Review of Systems  Constitutional:  Positive for malaise/fatigue. Negative for chills, fever and weight loss.  HENT:  Negative for congestion, ear discharge and nosebleeds.   Eyes:  Negative for blurred vision.  Respiratory:  Negative for cough, hemoptysis, sputum production, shortness of breath and wheezing.   Cardiovascular:  Negative for chest pain, palpitations, orthopnea and claudication.  Gastrointestinal:  Negative for abdominal pain, blood in stool, constipation, diarrhea, heartburn, melena, nausea and vomiting.  Genitourinary:  Negative for dysuria, flank pain, frequency, hematuria and urgency.  Musculoskeletal:  Negative for back pain, joint pain and myalgias.  Skin:  Negative for rash.  Neurological:  Negative for dizziness, tingling, focal weakness, seizures, weakness and headaches.  Endo/Heme/Allergies:   Does not bruise/bleed easily.  Psychiatric/Behavioral:  Negative for depression and suicidal ideas. The patient does not have insomnia.    Allergies  Allergen Reactions   Arimidex [Anastrozole] Other (See Comments)    Joint pain   Glipizide Other (See Comments)    Hypoglycemia    Metformin And Related Other (See Comments)    diarrhea   Penicillins Hives and Rash    Patient Active Problem List   Diagnosis Date Noted   Postviral fatigue syndrome 09/29/2020   History of COVID-19 09/24/2020   Low grade fever 09/24/2020   Posterior vitreous detachment of right eye 11/06/2019   Cystoid macular edema of left eye 07/14/2019   Branch retinal vein occlusion with macular edema of left eye 07/14/2019   Hypertensive retinopathy of left eye 07/14/2019   Vitreous floaters of left eye 07/14/2019   Right foot pain 04/09/2019   Rectal bleeding 05/20/2018   Fatigue 05/20/2018   Hip pain, bilateral 08/31/2017   Vitamin B12 deficiency 09/21/2016   Benign neoplasm of tongue 04/11/2016   Diabetic gastroparesis ([place]) 03/27/2016   Retinal vein occlusion, branch 01/03/2016   Alternating constipation and diarrhea 08/12/2015   AVM (arteriovenous malformation) of colon 05/18/2015   IBS (irritable bowel syndrome) 02/28/2015   Diverticulitis of colon 10/14/2014   Gout 07/22/2014   Hypothyroidism 04/23/2014   Nausea without vomiting 02/18/2014   Encounter for Medicare annual wellness exam 01/23/2014   Estrogen deficiency 01/23/2014   Colon cancer screening 01/23/2014   Tremor 01/20/2014   Allergic rhinitis 01/07/2014   Thrombocytopenia ([place]) 11/25/2013   Diabetic neuropathy ([place]) 11/25/2013   Lap Nissen October 2013 01/19/2012   Large type III mixed hiatus hernia  12/06/2011  Stress reaction 10/25/2011   Routine general medical examination at a health care facility 07/11/2011   TRANSAMINASES, SERUM, ELEVATED 11/11/2009   History of breast cancer 08/02/2009   DIVERTICULOSIS-COLON 03/03/2008    ESOPHAGEAL STRICTURE 03/02/2008   HIATAL HERNIA 03/02/2008   PERITONEAL ADHESIONS 03/02/2008   Anemia 02/07/2008   Poorly controlled type 2 diabetes mellitus with neuropathy ([place]) 08/19/2007   GAD (generalized anxiety disorder) 04/15/2007   DISORDERS, ORGANIC SLEEP NEC 08/23/2006   SYNDROME, RESTLESS LEGS 08/23/2006   SYNDROME, CARPAL TUNNEL 08/23/2006   Hyperlipidemia associated with type 2 diabetes mellitus ([place]) 08/22/2006   Essential hypertension 08/22/2006   ALLERGIC RHINITIS 08/22/2006   GERD 08/22/2006   Fatty liver 08/22/2006   History of seizure 08/22/2006     Past Medical History:  Diagnosis Date   Allergy    allegic rhinitis   Anemia    Angiodysplasia of colon    Anxiety    Arthritis    Blood transfusion    Borderline diabetic    per patient medical history form   Breast cancer ([place]) 02/2009   breast CA L invasive ductal CA(hormone receptor positive.)   Cataract    Colon polyp    hyperplastic   Colon stricture ([place]) 01/14/2003   Diabetes mellitus    pt's medical doctor took her off diabetic meds-blood sugars have not been a problem--and medications were causing pt's sugars to be too low   Diverticulosis    Esophageal stricture 02/04/2008   GERD (gastroesophageal reflux disease)    GI bleed    Hepatic steatosis    Hiatal hernia    Hyperlipidemia    Hypertension    Hypothyroidism    IBS (irritable bowel syndrome)    Melena 01/2008   anemia transfusion   Menopause    per patient medical history form   Osteoporosis    Postmenopausal hormone therapy    per patient medical history form.   RLS (restless legs syndrome)    Salmonella 05/2000   renal effects secondary to dehydration   Seizure disorder ([place])    Seizures ([place])    one seizure several yrs ago --etiology unknown-no problem since     Past Surgical History:  Procedure Laterality Date   ABDOMINAL HYSTERECTOMY  1983   BREAST SURGERY  02/2009   breast biopsy invasive ductal CA-bilateral  mastectomies   EPIGASTRIC HERNIA REPAIR  01/02/2012   Procedure: HERNIA REPAIR EPIGASTRIC ADULT;  Surgeon: Pedro Earls, MD;  Location: [facility];  Service: General;  Laterality: N/A;  Laparoscopic Repair Paraesophageal Hernia, Nissen   EYE SURGERY Left 07/2017   Dr. Zadie Rhine   LAPAROSCOPIC NISSEN FUNDOPLICATION  [id number]   Procedure: LAPAROSCOPIC NISSEN FUNDOPLICATION;  Surgeon: Pedro Earls, MD;  Location: [facility];  Service: General;  Laterality: N/A;   MASTECTOMY  04/2009   Bilateral for ductal carcinoma on L   OVARY SURGERY  1986   ovarian tumor removed    Social History   Socioeconomic History   Marital status: Married    Spouse name: Not on file   Number of children: 3   Years of education: Not on file   Highest education level: Not on file  Occupational History   Occupation: retired    Fish farm manager: UNEMPLOYED  Tobacco Use   Smoking status: Never   Smokeless tobacco: Never  Vaping Use   Vaping Use: Never used  Substance and Sexual Activity   Alcohol use: No    Alcohol/week: 0.0 standard drinks   Drug use: No  Sexual activity: Not Currently  Other Topics Concern   Not on file  Social History Narrative   ** Merged History Encounter **       1 cup of coffee every morning   Social Determinants of Health   Financial Resource Strain: Low Risk    Difficulty of Paying Living Expenses: Not hard at all  Food Insecurity: No Food Insecurity   Worried About Charity fundraiser in the Last Year: Never true   [place] in the Last Year: Never true  Transportation Needs: No Transportation Needs   Lack of Transportation (Medical): No   Lack of Transportation (Non-Medical): No  Physical Activity: Inactive   Days of Exercise per Week: 0 days   Minutes of Exercise per Session: 0 min  Stress: No Stress Concern Present   Feeling of Stress : Only a little  Social Connections: Not on file  Intimate Partner Violence: Not At Risk   Fear of Current or Ex-Partner: No    Emotionally Abused: No   Physically Abused: No   Sexually Abused: No     Family History  Problem Relation Age of Onset   Heart attack Father    Diabetes Father    Esophageal cancer Father    Breast cancer Sister        x 2 sisters   Diabetes Sister    Hypertension Brother    Diabetes Brother    Hypertension Mother    Stroke Mother    Breast cancer Mother    Hypertension Sister      Current Outpatient Medications:    Alpha-Lipoic Acid 600 MG TABS, Take 600 mg by mouth 2 (two) times daily. (Patient taking differently: Take 600 mg by mouth 2 (two) times daily. Pt taking as needed.), Disp: 180 tablet, Rfl: 3   amLODipine (NORVASC) 5 MG tablet, Take 1 tablet (5 mg total) by mouth daily., Disp: 90 tablet, Rfl: 3   empagliflozin (JARDIANCE) 25 MG TABS tablet, Take 1 tablet (25 mg total) by mouth daily before breakfast., Disp: 90 tablet, Rfl: 3   ezetimibe (ZETIA) 10 MG tablet, TAKE 1 TABLET DAILY, Disp: 90 tablet, Rfl: 0   fluticasone (FLONASE) 50 MCG/ACT nasal spray, Place 2 sprays into both nostrils daily as needed. Allergies, Disp: 48 g, Rfl: 3   hydrochlorothiazide (HYDRODIURIL) 25 MG tablet, Take 1 tablet (25 mg total) by mouth daily., Disp: 90 tablet, Rfl: 3   levothyroxine (SYNTHROID) 50 MCG tablet, TAKE 1 TABLET DAILY BEFORE BREAKFAST, Disp: 90 tablet, Rfl: 3   losartan (COZAAR) 100 MG tablet, Take 1 tablet (100 mg total) by mouth daily., Disp: 90 tablet, Rfl: 3   Multiple Vitamin (MULTIVITAMIN WITH MINERALS) TABS tablet, Take 1 tablet by mouth daily., Disp: , Rfl:    naproxen sodium (ALEVE) 220 MG tablet, Take 220 mg by mouth daily as needed (pain)., Disp: , Rfl:    omeprazole (PRILOSEC) 20 MG capsule, Take 1 capsule (20 mg total) by mouth daily., Disp: 90 capsule, Rfl: 3   repaglinide (PRANDIN) 1 MG tablet, Take 1 tablet (1 mg total) by mouth daily before supper., Disp: 90 tablet, Rfl: 3   rOPINIRole (REQUIP) 1 MG tablet, TAKE 1 TABLET AT BEDTIME, Disp: 90 tablet, Rfl: 3    rosuvastatin (CRESTOR) 10 MG tablet, TAKE 1 TABLET TWICE A WEEK, Disp: 24 tablet, Rfl: 3   sertraline (ZOLOFT) 100 MG tablet, Take 1 tablet (100 mg total) by mouth at bedtime., Disp: 90 tablet, Rfl: 3  tiZANidine (ZANAFLEX) 4 MG tablet, Take 0.5-1 tablets (2-4 mg total) by mouth at bedtime as needed for muscle spasms., Disp: 90 tablet, Rfl: 0   TRULICITY 3 0000000 SOPN, INJECT 0.5 ML (3 MG) UNDER THE SKIN ONCE A WEEK, Disp: 6 mL, Rfl: 3   Physical exam:  Vitals:   10/19/20 1340  BP: 116/68  Pulse: 92  Resp: 20  Temp: 98 F (36.7 C)  TempSrc: Tympanic  SpO2: 97%  Weight: 142 lb 4.8 oz (64.5 kg)   Physical Exam Cardiovascular:     Rate and Rhythm: Normal rate and regular rhythm.     Heart sounds: Normal heart sounds.  Pulmonary:     Effort: Pulmonary effort is normal.     Breath sounds: Normal breath sounds.  Abdominal:     General: Bowel sounds are normal.     Palpations: Abdomen is soft.     Comments: No palpable hepatosplenomegaly  Lymphadenopathy:     Comments: No palpable cervical, supraclavicular, axillary or inguinal adenopathy    Skin:    General: Skin is warm and dry.  Neurological:     Mental Status: She is alert and oriented to person, place, and time.       CMP Latest Ref Rng & Units 10/19/2020  Glucose 70 - 99 mg/dL 148(H)  BUN 8 - 23 mg/dL 26(H)  Creatinine 0.44 - 1.00 mg/dL 0.95  Sodium 135 - 145 mmol/L 139  Potassium 3.5 - 5.1 mmol/L 3.9  Chloride 98 - 111 mmol/L 100  CO2 22 - 32 mmol/L 29  Calcium 8.9 - 10.3 mg/dL 9.4  Total Protein 6.5 - 8.1 g/dL 8.4(H)  Total Bilirubin 0.3 - 1.2 mg/dL 0.9  Alkaline Phos 38 - 126 U/L 85  AST 15 - 41 U/L 34  ALT 0 - 44 U/L 22   CBC Latest Ref Rng & Units 10/19/2020  WBC 4.0 - 10.5 K/uL 4.6  Hemoglobin 12.0 - 15.0 g/dL 13.9  Hematocrit 36.0 - 46.0 % 43.6  Platelets 150 - 400 K/uL 100(L)    No images are attached to the encounter.  DG Chest 2 View  Result Date: 09/24/2020 CLINICAL DATA:  Low-grade fever and  fatigue. COVID-19 infection approximately 1 month ago. EXAM: CHEST - 2 VIEW COMPARISON:  03/12/2020 FINDINGS: The heart size and mediastinal contours are within normal limits. There is no evidence of pulmonary edema, consolidation, pneumothorax, nodule or pleural fluid. The thoracic spine demonstrates degenerative disc disease. IMPRESSION: No active cardiopulmonary disease. Electronically Signed   By: Aletta Edouard M.D.   On: 09/24/2020 16:43    Assessment and plan- Patient is a 75 y.o. female referred for thrombocytopenia  Patient has mild isolated thrombocytopenia with a platelet count is presently between 100s to 110s.  She has had some ongoing thrombocytopenia in the absence of other cytopenias at least dating back to 2016.  I suspect she has a component of ITP.Today I will check HIV, hepatitis C, B12 folate as well as a repeat CBC.  She had a pathologic smear review last month which showed possible evidence of microcytosis and hypochromia.  I will therefore check ferritin and iron studies as well today.  I will see the patient back in 1 week's time to discuss the results of blood work for a video visit   Thank you for this kind referral and the opportunity to participate in the care of this  Patient   Visit Diagnosis 1. Thrombocytopenia ([place])     Dr. Randa Evens, MD, MPH  [place] at [facility] [id number] 10/23/2020

## 2020-10-20 LAB — HEPATITIS C ANTIBODY: HCV Ab: NONREACTIVE

## 2020-10-20 LAB — HIV ANTIBODY (ROUTINE TESTING W REFLEX): HIV Screen 4th Generation wRfx: NONREACTIVE

## 2020-10-23 ENCOUNTER — Encounter: Payer: Self-pay | Admitting: Oncology

## 2020-10-28 ENCOUNTER — Inpatient Hospital Stay (HOSPITAL_BASED_OUTPATIENT_CLINIC_OR_DEPARTMENT_OTHER): Payer: Medicare Other | Admitting: Oncology

## 2020-10-28 ENCOUNTER — Encounter: Payer: Self-pay | Admitting: Oncology

## 2020-10-28 DIAGNOSIS — D696 Thrombocytopenia, unspecified: Secondary | ICD-10-CM | POA: Diagnosis not present

## 2020-10-28 NOTE — Progress Notes (Signed)
Pt has no concerns

## 2020-10-28 NOTE — Progress Notes (Signed)
I connected with Valerie Ball on 10/28/20 at 10:00 AM EDT by video enabled telemedicine visit and verified that I am speaking with the correct person using two identifiers.   I discussed the limitations, risks, security and privacy concerns of performing an evaluation and management service by telemedicine and the availability of in-person appointments. I also discussed with the patient that there may be a patient responsible charge related to this service. The patient expressed understanding and agreed to proceed.  Other persons participating in the visit and their role in the encounter:  none  Patient's location:  home Provider's location:  home  Chief Complaint:  discuss results of bloodwork  Diagnosis: mild isolated thrombocytopenia likely secondary to ITP  History of present illness: patient is a 75 year old female with a past medical history significant for Hypertension hyperlipidemia hypothyroidism type 2 diabetes among other medical problems.  She has been referred to [place] for thrombocytopenia.  Her most recent CBC from 09/30/2020 showed white count of 4.9, H&H of 13.5/42.7 and a platelet count of 104.  Looking back at her prior CBCs patient has always had mild isolated thrombocytopenia at least dating back to 2016.  Back in 2016 her platelet counts were more in the 130s and then slowly started drifting down to the 120s and presently between 100s to 110s.  She denies any significant bleeding or bruising.  Appetite and weight have remained stable  Interval history patient reports doing well overall and denies any specific complaints at this time   Review of Systems  Constitutional:  Negative for chills, fever, malaise/fatigue and weight loss.  HENT:  Negative for congestion, ear discharge and nosebleeds.   Eyes:  Negative for blurred vision.  Respiratory:  Negative for cough, hemoptysis, sputum production, shortness of breath and wheezing.   Cardiovascular:  Negative for chest pain,  palpitations, orthopnea and claudication.  Gastrointestinal:  Negative for abdominal pain, blood in stool, constipation, diarrhea, heartburn, melena, nausea and vomiting.  Genitourinary:  Negative for dysuria, flank pain, frequency, hematuria and urgency.  Musculoskeletal:  Negative for back pain, joint pain and myalgias.  Skin:  Negative for rash.  Neurological:  Negative for dizziness, tingling, focal weakness, seizures, weakness and headaches.  Endo/Heme/Allergies:  Does not bruise/bleed easily.  Psychiatric/Behavioral:  Negative for depression and suicidal ideas. The patient does not have insomnia.    Allergies  Allergen Reactions   Arimidex [Anastrozole] Other (See Comments)    Joint pain   Glipizide Other (See Comments)    Hypoglycemia    Metformin And Related Other (See Comments)    diarrhea   Penicillins Hives and Rash    Past Medical History:  Diagnosis Date   Allergy    allegic rhinitis   Anemia    Angiodysplasia of colon    Anxiety    Arthritis    Blood transfusion    Borderline diabetic    per patient medical history form   Breast cancer ([place]) 02/2009   breast CA L invasive ductal CA(hormone receptor positive.)   Cataract    Colon polyp    hyperplastic   Colon stricture ([place]) 01/14/2003   Diabetes mellitus    pt's medical doctor took her off diabetic meds-blood sugars have not been a problem--and medications were causing pt's sugars to be too low   Diverticulosis    Esophageal stricture 02/04/2008   GERD (gastroesophageal reflux disease)    GI bleed    Hepatic steatosis    Hiatal hernia    Hyperlipidemia  Hypertension    Hypothyroidism    IBS (irritable bowel syndrome)    Melena 01/2008   anemia transfusion   Menopause    per patient medical history form   Osteoporosis    Postmenopausal hormone therapy    per patient medical history form.   RLS (restless legs syndrome)    Salmonella 05/2000   renal effects secondary to dehydration   Seizure  disorder ([place])    Seizures ([place])    one seizure several yrs ago --etiology unknown-no problem since    Past Surgical History:  Procedure Laterality Date   ABDOMINAL HYSTERECTOMY  1983   BREAST SURGERY  02/2009   breast biopsy invasive ductal CA-bilateral mastectomies   EPIGASTRIC HERNIA REPAIR  01/02/2012   Procedure: HERNIA REPAIR EPIGASTRIC ADULT;  Surgeon: Pedro Earls, MD;  Location: [facility];  Service: General;  Laterality: N/A;  Laparoscopic Repair Paraesophageal Hernia, Nissen   EYE SURGERY Left 07/2017   Dr. Zadie Rhine   LAPAROSCOPIC NISSEN FUNDOPLICATION  16/12/9602   Procedure: LAPAROSCOPIC NISSEN FUNDOPLICATION;  Surgeon: Pedro Earls, MD;  Location: [facility];  Service: General;  Laterality: N/A;   MASTECTOMY  04/2009   Bilateral for ductal carcinoma on L   OVARY SURGERY  1986   ovarian tumor removed    Social History   Socioeconomic History   Marital status: Married    Spouse name: Not on file   Number of children: 3   Years of education: Not on file   Highest education level: Not on file  Occupational History   Occupation: retired    Fish farm manager: UNEMPLOYED  Tobacco Use   Smoking status: Never   Smokeless tobacco: Never  Vaping Use   Vaping Use: Never used  Substance and Sexual Activity   Alcohol use: No    Alcohol/week: 0.0 standard drinks   Drug use: No   Sexual activity: Not Currently  Other Topics Concern   Not on file  Social History Narrative   ** Merged History Encounter **       1 cup of coffee every morning   Social Determinants of Health   Financial Resource Strain: Low Risk    Difficulty of Paying Living Expenses: Not hard at all  Food Insecurity: No Food Insecurity   Worried About Charity fundraiser in the Last Year: Never true   [place] in the Last Year: Never true  Transportation Needs: No Transportation Needs   Lack of Transportation (Medical): No   Lack of Transportation (Non-Medical): No  Physical Activity: Inactive    Days of Exercise per Week: 0 days   Minutes of Exercise per Session: 0 min  Stress: No Stress Concern Present   Feeling of Stress : Only a little  Social Connections: Not on file  Intimate Partner Violence: Not At Risk   Fear of Current or Ex-Partner: No   Emotionally Abused: No   Physically Abused: No   Sexually Abused: No    Family History  Problem Relation Age of Onset   Heart attack Father    Diabetes Father    Esophageal cancer Father    Breast cancer Sister        x 2 sisters   Diabetes Sister    Hypertension Brother    Diabetes Brother    Hypertension Mother    Stroke Mother    Breast cancer Mother    Hypertension Sister      Current Outpatient Medications:    amLODipine (NORVASC) 5  MG tablet, Take 1 tablet (5 mg total) by mouth daily., Disp: 90 tablet, Rfl: 3   empagliflozin (JARDIANCE) 25 MG TABS tablet, Take 1 tablet (25 mg total) by mouth daily before breakfast., Disp: 90 tablet, Rfl: 3   ezetimibe (ZETIA) 10 MG tablet, TAKE 1 TABLET DAILY, Disp: 90 tablet, Rfl: 0   fluticasone (FLONASE) 50 MCG/ACT nasal spray, Place 2 sprays into both nostrils daily as needed. Allergies, Disp: 48 g, Rfl: 3   hydrochlorothiazide (HYDRODIURIL) 25 MG tablet, Take 1 tablet (25 mg total) by mouth daily., Disp: 90 tablet, Rfl: 3   levothyroxine (SYNTHROID) 50 MCG tablet, TAKE 1 TABLET DAILY BEFORE BREAKFAST, Disp: 90 tablet, Rfl: 3   losartan (COZAAR) 100 MG tablet, Take 1 tablet (100 mg total) by mouth daily., Disp: 90 tablet, Rfl: 3   Multiple Vitamin (MULTIVITAMIN WITH MINERALS) TABS tablet, Take 1 tablet by mouth daily., Disp: , Rfl:    naproxen sodium (ALEVE) 220 MG tablet, Take 220 mg by mouth daily as needed (pain)., Disp: , Rfl:    omeprazole (PRILOSEC) 20 MG capsule, Take 1 capsule (20 mg total) by mouth daily., Disp: 90 capsule, Rfl: 3   repaglinide (PRANDIN) 1 MG tablet, Take 1 tablet (1 mg total) by mouth daily before supper., Disp: 90 tablet, Rfl: 3   rOPINIRole (REQUIP) 1  MG tablet, TAKE 1 TABLET AT BEDTIME, Disp: 90 tablet, Rfl: 3   rosuvastatin (CRESTOR) 10 MG tablet, TAKE 1 TABLET TWICE A WEEK, Disp: 24 tablet, Rfl: 3   sertraline (ZOLOFT) 100 MG tablet, Take 1 tablet (100 mg total) by mouth at bedtime., Disp: 90 tablet, Rfl: 3   TRULICITY 3 BO/1.7PZ SOPN, INJECT 0.5 ML (3 MG) UNDER THE SKIN ONCE A WEEK, Disp: 6 mL, Rfl: 3   Alpha-Lipoic Acid 600 MG TABS, Take 600 mg by mouth 2 (two) times daily. (Patient not taking: Reported on 10/28/2020), Disp: 180 tablet, Rfl: 3  No results found.  No images are attached to the encounter.   CMP Latest Ref Rng & Units 10/19/2020  Glucose 70 - 99 mg/dL 148(H)  BUN 8 - 23 mg/dL 26(H)  Creatinine 0.44 - 1.00 mg/dL 0.95  Sodium 135 - 145 mmol/L 139  Potassium 3.5 - 5.1 mmol/L 3.9  Chloride 98 - 111 mmol/L 100  CO2 22 - 32 mmol/L 29  Calcium 8.9 - 10.3 mg/dL 9.4  Total Protein 6.5 - 8.1 g/dL 8.4(H)  Total Bilirubin 0.3 - 1.2 mg/dL 0.9  Alkaline Phos 38 - 126 U/L 85  AST 15 - 41 U/L 34  ALT 0 - 44 U/L 22   CBC Latest Ref Rng & Units 10/19/2020  WBC 4.0 - 10.5 K/uL 4.6  Hemoglobin 12.0 - 15.0 g/dL 13.9  Hematocrit 36.0 - 46.0 % 43.6  Platelets 150 - 400 K/uL 100(L)     Observation/objective: Appears in no acute distress over video visit today.  Breathing is nonlabored  Assessment and plan: Patient is a 75 year old female referred for mild isolated thrombocytopenia likely secondary to ITP  Patient has had isolated thrombocytopenia with a platelet count that has been in the 130s dating back to 2016.  In 2018 and drifted down to 120 and presently has been in the 90s to 110s over the last 2 years.  White count and hemoglobin are normal.  Suspect her mild isolated thrombocytopenia secondary to ITP.  HIV hepatitis C B12 and folate levels were normal.  She does not require a bone marrow biopsy at this time.  Repeat  CBC with differential in 6 months in 1 year and I will see her back in 1 year  Follow-up instructions:as  above  I discussed the assessment and treatment plan with the patient. The patient was provided an opportunity to ask questions and all were answered. The patient agreed with the plan and demonstrated an understanding of the instructions.   The patient was advised to call back or seek an in-person evaluation if the symptoms worsen or if the condition fails to improve as anticipated.  Visit Diagnosis: 1. Thrombocytopenia ([place])     Dr. Randa Evens, MD, MPH [facility] at [facility] Tel- [phone] 10/28/2020 10:02 AM

## 2020-11-01 ENCOUNTER — Ambulatory Visit (INDEPENDENT_AMBULATORY_CARE_PROVIDER_SITE_OTHER): Payer: Medicare Other | Admitting: Internal Medicine

## 2020-11-01 ENCOUNTER — Encounter: Payer: Self-pay | Admitting: Internal Medicine

## 2020-11-01 ENCOUNTER — Other Ambulatory Visit: Payer: Self-pay

## 2020-11-01 VITALS — BP 120/78 | HR 83 | Ht 63.0 in | Wt 142.6 lb

## 2020-11-01 DIAGNOSIS — E1140 Type 2 diabetes mellitus with diabetic neuropathy, unspecified: Secondary | ICD-10-CM

## 2020-11-01 DIAGNOSIS — E1142 Type 2 diabetes mellitus with diabetic polyneuropathy: Secondary | ICD-10-CM | POA: Diagnosis not present

## 2020-11-01 DIAGNOSIS — E785 Hyperlipidemia, unspecified: Secondary | ICD-10-CM | POA: Diagnosis not present

## 2020-11-01 DIAGNOSIS — E1169 Type 2 diabetes mellitus with other specified complication: Secondary | ICD-10-CM | POA: Diagnosis not present

## 2020-11-01 DIAGNOSIS — E1165 Type 2 diabetes mellitus with hyperglycemia: Secondary | ICD-10-CM

## 2020-11-01 LAB — POCT GLYCOSYLATED HEMOGLOBIN (HGB A1C): Hemoglobin A1C: 6.1 % — AB (ref 4.0–5.6)

## 2020-11-01 MED ORDER — REPAGLINIDE 1 MG PO TABS: 1.0000 mg | ORAL_TABLET | Freq: Every day | ORAL | 3 refills | Status: DC

## 2020-11-01 NOTE — Patient Instructions (Addendum)
Please continue: - Jardiance 25 mg before b'fast - Prandin 1 mg before dinner - Trulicity 3 mg weekly  Also, for neuropathy, continue: - alpha-lipoic acid 600 mg 2x a day  Please return in 4-6 months with your sugar log.
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
## 2020-11-13 DIAGNOSIS — G2581 Restless legs syndrome: Secondary | ICD-10-CM | POA: Diagnosis not present

## 2020-12-20 ENCOUNTER — Encounter (INDEPENDENT_AMBULATORY_CARE_PROVIDER_SITE_OTHER): Payer: TRICARE For Life (TFL) | Admitting: Ophthalmology

## 2020-12-27 ENCOUNTER — Encounter (INDEPENDENT_AMBULATORY_CARE_PROVIDER_SITE_OTHER): Payer: Self-pay | Admitting: Ophthalmology

## 2020-12-27 ENCOUNTER — Ambulatory Visit (INDEPENDENT_AMBULATORY_CARE_PROVIDER_SITE_OTHER): Payer: Medicare Other | Admitting: Ophthalmology

## 2020-12-27 ENCOUNTER — Other Ambulatory Visit: Payer: Self-pay

## 2020-12-27 DIAGNOSIS — H43811 Vitreous degeneration, right eye: Secondary | ICD-10-CM | POA: Diagnosis not present

## 2020-12-27 DIAGNOSIS — H34832 Tributary (branch) retinal vein occlusion, left eye, with macular edema: Secondary | ICD-10-CM

## 2020-12-27 DIAGNOSIS — E1143 Type 2 diabetes mellitus with diabetic autonomic (poly)neuropathy: Secondary | ICD-10-CM | POA: Diagnosis not present

## 2020-12-27 DIAGNOSIS — K3184 Gastroparesis: Secondary | ICD-10-CM

## 2020-12-27 MED ORDER — BEVACIZUMAB 2.5 MG/0.1ML IZ SOSY
2.5000 mg | PREFILLED_SYRINGE | INTRAVITREAL | Status: AC | PRN
  Administered 2020-12-27: 2.5 mg via INTRAVITREAL

## 2020-12-27 NOTE — Assessment & Plan Note (Signed)

## 2020-12-27 NOTE — Assessment & Plan Note (Signed)
Branch retinal vein occlusion left eye now at 27-month follow-up post most recent injection anti vegF.  Overall condition improved from beginning and baseline yet small recurrence superior to the fovea post antivegF therapy, will need to resume antivegF, Avastin delivered OS today

## 2020-12-27 NOTE — Assessment & Plan Note (Signed)
No detectable diabetic retinopathy 

## 2020-12-27 NOTE — Progress Notes (Signed)
12/27/2020     CHIEF COMPLAINT Patient presents for  Chief Complaint  Patient presents with   Retina Follow Up      HISTORY OF PRESENT ILLNESS: Valerie Ball is a 75 y.o. female who presents to the clinic today for:   HPI     Retina Follow Up   Patient presents with  CRVO/BRVO.  In left eye.  This started 3 months ago.  Severity is mild.  Duration of 3 months.  Since onset it is stable.        Comments   3 month fu OU and OCT Pt states VA OU stable since last visit. Pt denies FOL, floaters, or ocular pain OU.  Pt states, "I do not know that my vision has changed any but it still is not good." A1C: 6.2 LBS: 118       Last edited by Kendra Opitz, COA on 12/27/2020  8:10 AM.      Referring physician: Tower, Wynelle Fanny, MD [place],  [place] [address]  HISTORICAL INFORMATION:   Selected notes from the MEDICAL RECORD NUMBER    Lab Results  Component Value Date   HGBA1C 6.1 (A) 11/01/2020     CURRENT MEDICATIONS: No current outpatient medications on file. (Ophthalmic Drugs)   No current facility-administered medications for this visit. (Ophthalmic Drugs)   Current Outpatient Medications (Other)  Medication Sig   Alpha-Lipoic Acid 600 MG TABS Take 600 mg by mouth 2 (two) times daily.   amLODipine (NORVASC) 5 MG tablet Take 1 tablet (5 mg total) by mouth daily.   empagliflozin (JARDIANCE) 25 MG TABS tablet Take 1 tablet (25 mg total) by mouth daily before breakfast.   ezetimibe (ZETIA) 10 MG tablet TAKE 1 TABLET DAILY   fluticasone (FLONASE) 50 MCG/ACT nasal spray Place 2 sprays into both nostrils daily as needed. Allergies   hydrochlorothiazide (HYDRODIURIL) 25 MG tablet Take 1 tablet (25 mg total) by mouth daily.   levothyroxine (SYNTHROID) 50 MCG tablet TAKE 1 TABLET DAILY BEFORE BREAKFAST   losartan (COZAAR) 100 MG tablet Take 1 tablet (100 mg total) by mouth daily.   Multiple Vitamin (MULTIVITAMIN WITH MINERALS) TABS tablet Take 1  tablet by mouth daily.   naproxen sodium (ALEVE) 220 MG tablet Take 220 mg by mouth daily as needed (pain).   omeprazole (PRILOSEC) 20 MG capsule Take 1 capsule (20 mg total) by mouth daily.   repaglinide (PRANDIN) 1 MG tablet Take 1 tablet (1 mg total) by mouth daily before supper.   rOPINIRole (REQUIP) 1 MG tablet TAKE 1 TABLET AT BEDTIME   rosuvastatin (CRESTOR) 10 MG tablet TAKE 1 TABLET TWICE A WEEK   sertraline (ZOLOFT) 100 MG tablet Take 1 tablet (100 mg total) by mouth at bedtime.   TRULICITY 3 LS/9.3TD SOPN INJECT 0.5 ML (3 MG) UNDER THE SKIN ONCE A WEEK   No current facility-administered medications for this visit. (Other)      REVIEW OF SYSTEMS:    ALLERGIES Allergies  Allergen Reactions   Arimidex [Anastrozole] Other (See Comments)    Joint pain   Glipizide Other (See Comments)    Hypoglycemia    Metformin And Related Other (See Comments)    diarrhea   Penicillins Hives and Rash    PAST MEDICAL HISTORY Past Medical History:  Diagnosis Date   Allergy    allegic rhinitis   Anemia    Angiodysplasia of colon    Anxiety    Arthritis  Blood transfusion    Borderline diabetic    per patient medical history form   Breast cancer ([place]) 02/2009   breast CA L invasive ductal CA(hormone receptor positive.)   Cataract    Colon polyp    hyperplastic   Colon stricture ([place]) 01/14/2003   Diabetes mellitus    pt's medical doctor took her off diabetic meds-blood sugars have not been a problem--and medications were causing pt's sugars to be too low   Diverticulosis    Esophageal stricture 02/04/2008   GERD (gastroesophageal reflux disease)    GI bleed    Hepatic steatosis    Hiatal hernia    Hyperlipidemia    Hypertension    Hypothyroidism    IBS (irritable bowel syndrome)    Melena 01/2008   anemia transfusion   Menopause    per patient medical history form   Osteoporosis    Postmenopausal hormone therapy    per patient medical history form.   RLS  (restless legs syndrome)    Salmonella 05/2000   renal effects secondary to dehydration   Seizure disorder ([place])    Seizures ([place])    one seizure several yrs ago --etiology unknown-no problem since   Past Surgical History:  Procedure Laterality Date   ABDOMINAL HYSTERECTOMY  1983   BREAST SURGERY  02/2009   breast biopsy invasive ductal CA-bilateral mastectomies   EPIGASTRIC HERNIA REPAIR  01/02/2012   Procedure: HERNIA REPAIR EPIGASTRIC ADULT;  Surgeon: Pedro Earls, MD;  Location: [facility];  Service: General;  Laterality: N/A;  Laparoscopic Repair Paraesophageal Hernia, [place]   EYE SURGERY Left 07/2017   Dr. Zadie Rhine   LAPAROSCOPIC NISSEN FUNDOPLICATION  40/97/3532   Procedure: LAPAROSCOPIC NISSEN FUNDOPLICATION;  Surgeon: Pedro Earls, MD;  Location: [facility];  Service: General;  Laterality: N/A;   MASTECTOMY  04/2009   Bilateral for ductal carcinoma on L   OVARY SURGERY  1986   ovarian tumor removed    FAMILY HISTORY Family History  Problem Relation Age of Onset   Heart attack Father    Diabetes Father    Esophageal cancer Father    Breast cancer Sister        x 2 sisters   Diabetes Sister    Hypertension Brother    Diabetes Brother    Hypertension Mother    Stroke Mother    Breast cancer Mother    Hypertension Sister     SOCIAL HISTORY Social History   Tobacco Use   Smoking status: Never   Smokeless tobacco: Never  Vaping Use   Vaping Use: Never used  Substance Use Topics   Alcohol use: No    Alcohol/week: 0.0 standard drinks   Drug use: No         OPHTHALMIC EXAM:  Base Eye Exam     Visual Acuity (ETDRS)       Right Left   Dist [place] 20/50 20/200 +1   Dist ph [place] 20/30 -2 NI         Tonometry (Tonopen, 8:14 AM)       Right Left   Pressure 11 10         Pupils       Pupils Dark Light Shape React APD   Right PERRL 3 2 Round Brisk None   Left PERRL 3 3 Round Minimal None         Visual Fields (Counting fingers)       Left  Right    Full Full  Extraocular Movement       Right Left    Full Full         Neuro/Psych     Oriented x3: Yes   Mood/Affect: Normal         Dilation     Both eyes: 1.0% Mydriacyl, 2.5% Phenylephrine @ 8:14 AM           Slit Lamp and Fundus Exam     External Exam       Right Left   External Normal Normal         Slit Lamp Exam       Right Left   Lids/Lashes Normal Normal   Conjunctiva/Sclera White and quiet White and quiet   Cornea Clear Clear   Anterior Chamber Deep and quiet Deep and quiet   Iris Round and reactive Round and reactive   Lens Posterior chamber intraocular lens Posterior chamber intraocular lens   Anterior Vitreous Normal Normal         Fundus Exam       Right Left   Posterior Vitreous Posterior vitreous detachment Central vitreous floaters   Disc Normal Normal, collaterals on nerve   C/D Ratio 0.4 0.50   Macula Normal Good Focal laser, Microaneurysms, no macular thickening, no cystoid macular edema   Vessels Normal, , no DR Macular branch retinal vein occlusion superotemporal.  No CME recurrence   Periphery Normal Normal            IMAGING AND PROCEDURES  Imaging and Procedures for 12/27/20  OCT, Retina - OU - Both Eyes       Right Eye Quality was good. Scan locations included subfoveal. Central Foveal Thickness: 260. Progression has been stable. Findings include abnormal foveal contour, no IRF, no SRF, retinal drusen .   Left Eye Quality was good. Scan locations included subfoveal. Central Foveal Thickness: 262. Progression has been stable. Findings include abnormal foveal contour, myopic contour, no SRF, no IRF.   Notes OD, retinal drusen but no active CNVM  OS with history of BRVO with CME yet stable at this time with new recurrence  currently 12-week follow-up post antivegF, will need repeat injection OS today to control condition   OS will continue to monitor follow     Intravitreal Injection,  Pharmacologic Agent - OS - Left Eye       Time Out 12/27/2020. 9:11 AM. Confirmed correct patient, procedure, site, and patient consented.   Anesthesia Topical anesthesia was used. Anesthetic medications included [place] 3.5%.   Procedure Preparation included Ofloxacin , 10% betadine to eyelids, 5% betadine to ocular surface, Tobramycin 0.3%. A 30 gauge needle was used.   Injection: 2.5 mg bevacizumab 2.5 MG/0.1ML   Route: Intravitreal, Site: Left Eye   NDC: [phone], Lot: [id number]   Post-op Post injection exam found visual acuity of at least counting fingers. The patient tolerated the procedure well. There were no complications. The patient received written and verbal post procedure care education. Post injection medications included ocuflox.              ASSESSMENT/PLAN:  Branch retinal vein occlusion with macular edema of left eye Branch retinal vein occlusion left eye now at 10-month follow-up post most recent injection anti vegF.  Overall condition improved from beginning and baseline yet small recurrence superior to the fovea post antivegF therapy, will need to resume antivegF, Avastin delivered OS today  Posterior vitreous detachment of right eye  The nature of posterior vitreous detachment was discussed with  the patient as well as its physiology, its age prevalence, and its possible implication regarding retinal breaks and detachment.  An informational brochure was offered to the patient.  All the patient's questions were answered.  The patient was asked to return if new or different flashes or floaters develops.   Patient was instructed to contact office immediately if any new changes were noticed. I explained to the patient that vitreous inside the eye is similar to jello inside a bowl. As the jello melts it can start to pull away from the bowl, similarly the vitreous throughout our lives can begin to pull away from the retina. That process is called a posterior vitreous  detachment. In some cases, the vitreous can tug hard enough on the retina to form a retinal tear. I discussed with the patient the signs and symptoms of a retinal detachment.  Do not rub the eye.    Diabetic gastroparesis ([place]) No detectable diabetic retinopathy      ICD-10-CM   1. Branch retinal vein occlusion with macular edema of left eye  H34.8320 OCT, Retina - OU - Both Eyes    Intravitreal Injection, Pharmacologic Agent - OS - Left Eye    bevacizumab (AVASTIN) SOSY 2.5 mg    2. Posterior vitreous detachment of right eye  H43.811     3. Diabetic gastroparesis ([facility])  E11.43    K31.84       1.  2.  3.  Ophthalmic Meds Ordered this visit:  Meds ordered this encounter  Medications   bevacizumab (AVASTIN) SOSY 2.5 mg       Return in about 4 months (around 04/29/2021) for DILATE OU, AVASTIN OCT, OS.  There are no Patient Instructions on file for this visit.   Explained the diagnoses, plan, and follow up with the patient and they expressed understanding.  Patient expressed understanding of the importance of proper follow up care.   Clent Demark Lynnex Fulp M.D. Diseases & Surgery of the Retina and Vitreous [facility] [place] 12/27/20     Abbreviations: M myopia (nearsighted); A astigmatism; H hyperopia (farsighted); P presbyopia; Mrx spectacle prescription;  CTL contact lenses; OD right eye; OS left eye; OU both eyes  XT exotropia; ET esotropia; PEK punctate epithelial keratitis; PEE punctate epithelial erosions; DES dry eye syndrome; MGD meibomian gland dysfunction; ATs artificial tears; PFAT's preservative free artificial tears; [place] nuclear sclerotic cataract; PSC posterior subcapsular cataract; ERM epi-retinal membrane; PVD posterior vitreous detachment; RD retinal detachment; DM diabetes mellitus; DR diabetic retinopathy; NPDR non-proliferative diabetic retinopathy; PDR proliferative diabetic retinopathy; CSME clinically significant macular edema; DME diabetic  macular edema; dbh dot blot hemorrhages; CWS cotton wool spot; POAG primary open angle glaucoma; C/D cup-to-disc ratio; HVF humphrey visual field; GVF goldmann visual field; OCT optical coherence tomography; IOP intraocular pressure; BRVO Branch retinal vein occlusion; CRVO central retinal vein occlusion; CRAO central retinal artery occlusion; BRAO branch retinal artery occlusion; RT retinal tear; SB scleral buckle; PPV pars plana vitrectomy; VH Vitreous hemorrhage; PRP panretinal laser photocoagulation; IVK intravitreal kenalog; VMT vitreomacular traction; MH Macular hole;  NVD neovascularization of the disc; NVE neovascularization elsewhere; AREDS age related eye disease study; ARMD age related macular degeneration; POAG primary open angle glaucoma; EBMD epithelial/anterior basement membrane dystrophy; ACIOL anterior chamber intraocular lens; IOL intraocular lens; PCIOL posterior chamber intraocular lens; Phaco/IOL phacoemulsification with intraocular lens placement; [place] photorefractive keratectomy; LASIK laser assisted in situ keratomileusis; HTN hypertension; DM diabetes mellitus; COPD chronic obstructive pulmonary disease

## 2021-01-06 ENCOUNTER — Other Ambulatory Visit: Payer: Self-pay | Admitting: Family Medicine

## 2021-03-03 ENCOUNTER — Ambulatory Visit: Payer: Medicare Other | Admitting: Internal Medicine

## 2021-03-03 ENCOUNTER — Other Ambulatory Visit: Payer: Self-pay

## 2021-03-03 ENCOUNTER — Encounter: Payer: Self-pay | Admitting: Internal Medicine

## 2021-03-03 VITALS — BP 110/68 | HR 86 | Ht 63.0 in | Wt 146.8 lb

## 2021-03-03 DIAGNOSIS — E1165 Type 2 diabetes mellitus with hyperglycemia: Secondary | ICD-10-CM | POA: Diagnosis not present

## 2021-03-03 DIAGNOSIS — E1142 Type 2 diabetes mellitus with diabetic polyneuropathy: Secondary | ICD-10-CM | POA: Diagnosis not present

## 2021-03-03 DIAGNOSIS — E1140 Type 2 diabetes mellitus with diabetic neuropathy, unspecified: Secondary | ICD-10-CM

## 2021-03-03 DIAGNOSIS — E1169 Type 2 diabetes mellitus with other specified complication: Secondary | ICD-10-CM | POA: Diagnosis not present

## 2021-03-03 DIAGNOSIS — E785 Hyperlipidemia, unspecified: Secondary | ICD-10-CM

## 2021-03-03 LAB — POCT GLYCOSYLATED HEMOGLOBIN (HGB A1C): Hemoglobin A1C: 6.1 % — AB (ref 4.0–5.6)

## 2021-03-03 MED ORDER — EMPAGLIFLOZIN 25 MG PO TABS: 25.0000 mg | ORAL_TABLET | Freq: Every day | ORAL | 3 refills | Status: DC
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
## 2021-03-03 NOTE — Patient Instructions (Addendum)
Please continue: - Jardiance 25 mg before b'fast - Trulicity 3 mg weekly  Change: - Prandin 1 mg before a larger dinner  Also, for neuropathy, continue: - alpha-lipoic acid 600 mg 2x a day  Please return in 6 months with your sugar log.

## 2021-03-07 NOTE — Progress Notes (Signed)
Subjective:   Valerie Ball is a 75 y.o. female who presents for Medicare Annual (Subsequent) preventive examination.  I connected with Alvester Chou today by telephone and verified that I am speaking with the correct person using two identifiers. Location patient: home Location provider: work Persons participating in the virtual visit: patient, Marine scientist.    I discussed the limitations, risks, security and privacy concerns of performing an evaluation and management service by telephone and the availability of in person appointments. I also discussed with the patient that there may be a patient responsible charge related to this service. The patient expressed understanding and verbally consented to this telephonic visit.    Interactive audio and video telecommunications were attempted between this provider and patient, however failed, due to patient having technical difficulties OR patient did not have access to video capability.  We continued and completed visit with audio only.  Some vital signs may be absent or patient reported.   Time Spent with patient on telephone encounter: 20 minutes  Review of Systems     Cardiac Risk Factors include: advanced age (>26men, >51 women);diabetes mellitus;hypertension;dyslipidemia     Objective:    Today's Vitals   03/08/21 1321  Weight: 142 lb (64.4 kg)  Height: 5\' 3"  (1.6 m)   Body mass index is 25.15 kg/m.  Advanced Directives 03/08/2021 10/28/2020 03/05/2020 09/06/2018 08/31/2017 08/22/2016 06/30/2015  Does Patient Have a Medical Advance Directive? No No No No No No No  Would patient like information on creating a medical advance directive? Yes (MAU/Ambulatory/Procedural Areas - Information given) No - Patient declined Yes (MAU/Ambulatory/Procedural Areas - Information given) No - Patient declined No - Patient declined - No - patient declined information  Pre-existing out of facility DNR order (yellow form or pink MOST form) - - - - - - -     Current Medications (verified) Outpatient Encounter Medications as of 03/08/2021  Medication Sig   Alpha-Lipoic Acid 600 MG TABS Take 600 mg by mouth 2 (two) times daily.   amLODipine (NORVASC) 5 MG tablet Take 1 tablet (5 mg total) by mouth daily.   empagliflozin (JARDIANCE) 25 MG TABS tablet Take 1 tablet (25 mg total) by mouth daily before breakfast.   ezetimibe (ZETIA) 10 MG tablet TAKE 1 TABLET DAILY   fluticasone (FLONASE) 50 MCG/ACT nasal spray Place 2 sprays into both nostrils daily as needed. Allergies   hydrochlorothiazide (HYDRODIURIL) 25 MG tablet Take 1 tablet (25 mg total) by mouth daily.   levothyroxine (SYNTHROID) 50 MCG tablet TAKE 1 TABLET DAILY BEFORE BREAKFAST   losartan (COZAAR) 100 MG tablet Take 1 tablet (100 mg total) by mouth daily.   Multiple Vitamin (MULTIVITAMIN WITH MINERALS) TABS tablet Take 1 tablet by mouth daily.   naproxen sodium (ALEVE) 220 MG tablet Take 220 mg by mouth daily as needed (pain).   omeprazole (PRILOSEC) 20 MG capsule Take 1 capsule (20 mg total) by mouth daily.   repaglinide (PRANDIN) 1 MG tablet Take 1 tablet (1 mg total) by mouth daily before supper.   rOPINIRole (REQUIP) 1 MG tablet TAKE 1 TABLET AT BEDTIME   rosuvastatin (CRESTOR) 10 MG tablet TAKE 1 TABLET TWICE A WEEK   sertraline (ZOLOFT) 100 MG tablet Take 1 tablet (100 mg total) by mouth at bedtime.   TRULICITY 3 VE/7.2CN SOPN INJECT 0.5 ML (3 MG) UNDER THE SKIN ONCE A WEEK   [DISCONTINUED] cetirizine (ZYRTEC) 10 MG tablet Take 10 mg by mouth daily as needed. For allergy symptoms  No facility-administered encounter medications on file as of 03/08/2021.    Allergies (verified) Arimidex [anastrozole], Glipizide, Metformin and related, and Penicillins   History: Past Medical History:  Diagnosis Date   Allergy    allegic rhinitis   Anemia    Angiodysplasia of colon    Anxiety    Arthritis    Blood transfusion    Borderline diabetic    per patient medical history form    Breast cancer ([place]) 02/2009   breast CA L invasive ductal CA(hormone receptor positive.)   Cataract    Colon polyp    hyperplastic   Colon stricture ([place]) 01/14/2003   Diabetes mellitus    pt's medical doctor took her off diabetic meds-blood sugars have not been a problem--and medications were causing pt's sugars to be too low   Diverticulosis    Esophageal stricture 02/04/2008   GERD (gastroesophageal reflux disease)    GI bleed    Hepatic steatosis    Hiatal hernia    Hyperlipidemia    Hypertension    Hypothyroidism    IBS (irritable bowel syndrome)    Melena 01/2008   anemia transfusion   Menopause    per patient medical history form   Osteoporosis    Postmenopausal hormone therapy    per patient medical history form.   RLS (restless legs syndrome)    Salmonella 05/2000   renal effects secondary to dehydration   Seizure disorder ([place])    Seizures ([place])    one seizure several yrs ago --etiology unknown-no problem since   Past Surgical History:  Procedure Laterality Date   ABDOMINAL HYSTERECTOMY  1983   BREAST SURGERY  02/2009   breast biopsy invasive ductal CA-bilateral mastectomies   EPIGASTRIC HERNIA REPAIR  01/02/2012   Procedure: HERNIA REPAIR EPIGASTRIC ADULT;  Surgeon: Pedro Earls, MD;  Location: [facility];  Service: General;  Laterality: N/A;  Laparoscopic Repair Paraesophageal Hernia, [place]   EYE SURGERY Left 07/2017   Dr. Zadie Rhine   LAPAROSCOPIC NISSEN FUNDOPLICATION  02/54/2706   Procedure: LAPAROSCOPIC NISSEN FUNDOPLICATION;  Surgeon: Pedro Earls, MD;  Location: [facility];  Service: General;  Laterality: N/A;   MASTECTOMY  04/2009   Bilateral for ductal carcinoma on L   OVARY SURGERY  1986   ovarian tumor removed   Family History  Problem Relation Age of Onset   Heart attack Father    Diabetes Father    Esophageal cancer Father    Breast cancer Sister        x 2 sisters   Diabetes Sister    Hypertension Brother    Diabetes Brother     Hypertension Mother    Stroke Mother    Breast cancer Mother    Hypertension Sister    Social History   Socioeconomic History   Marital status: Married    Spouse name: Not on file   Number of children: 3   Years of education: Not on file   Highest education level: Not on file  Occupational History   Occupation: retired    Fish farm manager: UNEMPLOYED  Tobacco Use   Smoking status: Never   Smokeless tobacco: Never  Vaping Use   Vaping Use: Never used  Substance and Sexual Activity   Alcohol use: No    Alcohol/week: 0.0 standard drinks   Drug use: No   Sexual activity: Not Currently  Other Topics Concern   Not on file  Social History Narrative   ** Merged History Encounter **  1 cup of coffee every morning   Social Determinants of Health   Financial Resource Strain: Low Risk    Difficulty of Paying Living Expenses: Not very hard  Food Insecurity: No Food Insecurity   Worried About Charity fundraiser in the Last Year: Never true   Ran Out of Food in the Last Year: Never true  Transportation Needs: No Transportation Needs   Lack of Transportation (Medical): No   Lack of Transportation (Non-Medical): No  Physical Activity: Sufficiently Active   Days of Exercise per Week: 7 days   Minutes of Exercise per Session: 30 min  Stress: No Stress Concern Present   Feeling of Stress : Only a little  Social Connections: Engineer, building services of Communication with Friends and Family: Three times a week   Frequency of Social Gatherings with Friends and Family: Once a week   Attends Religious Services: More than 4 times per year   Active Member of [employer] or Organizations: Yes   Attends Music therapist: More than 4 times per year   Marital Status: Married    Tobacco Counseling Counseling given: Not Answered   Clinical Intake:  Pre-visit preparation completed: Yes  Pain : No/denies pain     BMI - recorded: 25.27 Nutritional Status: BMI 25 -29  Overweight Nutritional Risks: None Diabetes: Yes CBG done?: No Did pt. bring in CBG monitor from home?: No  How often do you need to have someone help you when you read instructions, pamphlets, or other written materials from your doctor or pharmacy?: 1 - Never  Diabetes:  Is the patient diabetic?  Yes  If diabetic, was a CBG obtained today?  No , visit completed over the phone. Did the patient bring in their glucometer from home?  No , visit completed over the phone How often do you monitor your CBG's? Daily .   Financial Strains and Diabetes Management:  Are you having any financial strains with the device, your supplies or your medication? No .  Does the patient want to be seen by Chronic Care Management for management of their diabetes?  No  Would the patient like to be referred to a Nutritionist or for Diabetic Management?  No   Diabetic Exams:  Diabetic Eye Exam: Completed 05/10/20.   Diabetic Foot Exam:  Due,  Pt has been advised about the importance in completing this exam.    Interpreter Needed?: No  Information entered by :: Orrin Brigham LPN   Activities of Daily Living In your present state of health, do you have any difficulty performing the following activities: 03/08/2021  Hearing? Y  Vision? N  Difficulty concentrating or making decisions? N  Walking or climbing stairs? N  Dressing or bathing? N  Doing errands, shopping? N  Preparing Food and eating ? N  Using the Toilet? N  In the past six months, have you accidently leaked urine? N  Do you have problems with loss of bowel control? Y  Comment rectal discharge  Managing your Medications? N  Managing your Finances? N  Housekeeping or managing your Housekeeping? N  Some recent data might be hidden    Patient Care Team: Tower, Wynelle Fanny, MD as PCP - General  Indicate any recent Medical Services you may have received from other than [facility] providers in the past year (date may be approximate).      Assessment:   This is a routine wellness examination for Naamah.  Hearing/Vision screen Hearing Screening - Comments::  Patient states decrease in hearing Vision Screening - Comments:: Last exam 10/2020, Dr. Zadie Rhine   Dietary issues and exercise activities discussed: Current Exercise Habits: Home exercise routine, Type of exercise: walking, Time (Minutes): 30, Frequency (Times/Week): 7, Weekly Exercise (Minutes/Week): 210, Intensity: Moderate   Goals Addressed             This Visit's Progress    Patient Stated       Would like to drink more water.        Depression Screen PHQ 2/9 Scores 03/08/2021 03/05/2020 09/06/2018 08/31/2017 08/22/2016 01/26/2015 01/07/2014  PHQ - 2 Score 1 0 0 4 0 0 0  PHQ- 9 Score - 0 0 15 - - -    Fall Risk Fall Risk  03/08/2021 03/05/2020 09/06/2018 08/31/2017 08/22/2016  Falls in the past year? 0 0 0 Yes No  Comment - - - pt lost balance and fell off bed -  Number falls in past yr: 0 0 - 1 -  Injury with Fall? 0 0 - Yes -  Comment - - - bruise to right knee -  Risk for fall due to : - Medication side effect - - -  Follow up Falls prevention discussed Falls evaluation completed;Falls prevention discussed - - -    FALL RISK PREVENTION PERTAINING TO THE HOME:  Any stairs in or around the home? No  If so, are there any without handrails? Yes  Home free of loose throw rugs in walkways, pet beds, electrical cords, etc? Yes  Adequate lighting in your home to reduce risk of falls? Yes   ASSISTIVE DEVICES UTILIZED TO PREVENT FALLS:  Life alert? No  Use of a cane, walker or w/c? No  Grab bars in the bathroom? No  Shower chair or bench in shower? Yes  Elevated toilet seat or a handicapped toilet? No   TIMED UP AND GO:  Was the test performed? No , visit completed over the phone.    Cognitive Function: Normal cognitive status assessed by this Nurse Health Advisor. No abnormalities found.   MMSE - Mini Mental State Exam 03/05/2020 09/06/2018  08/31/2017 08/22/2016  Orientation to time 5 5 5 5   Orientation to Place 5 5 5 5   Registration 3 3 3 3   Attention/ Calculation 5 0 0 0  Recall 3 3 3 3   Language- name 2 objects - 0 0 0  Language- repeat 1 1 1 1   Language- follow 3 step command - 0 3 3  Language- read & follow direction - 0 0 0  Write a sentence - 0 0 0  Copy design - 0 0 0  Total score - 17 20 20         Immunizations Immunization History  Administered Date(s) Administered   Fluad Quad(high Dose 65+) 11/19/2018   Influenza Split 01/17/2011   Influenza Whole 04/04/2005, 01/22/2009   Influenza, High Dose Seasonal PF 01/10/2018, 01/02/2020   Influenza,inj,Quad PF,6+ Mos 12/06/2012, 11/25/2013, 01/26/2015, 11/12/2015, 03/29/2017   Influenza-Unspecified 12/19/2019   PFIZER(Purple Top)SARS-COV-2 Vaccination 04/21/2019, 05/19/2019, 12/19/2019   Pneumococcal Conjugate-13 01/26/2015   Pneumococcal Polysaccharide-23 02/10/2008, 01/23/2014   Tdap 07/19/2011   Zoster Recombinat (Shingrix) 01/10/2018, 04/21/2018    TDAP status: Up to date  Flu Vaccine status: Up to date  Pneumococcal vaccine status: Up to date  Covid-19 vaccine status: Information provided on how to obtain vaccines.   Qualifies for Shingles Vaccine? Yes   Zostavax completed No   Shingrix Completed?: Yes  Screening Tests Health Maintenance  Topic  Date Due   MAMMOGRAM  03/24/2011   FOOT EXAM  10/02/2019   COVID-19 Vaccine (4 - Booster for Pfizer series) 02/13/2020   INFLUENZA VACCINE  10/18/2020   OPHTHALMOLOGY EXAM  05/10/2021   TETANUS/TDAP  07/18/2021   HEMOGLOBIN A1C  09/01/2021   COLONOSCOPY (Pts 45-62yrs Insurance coverage will need to be confirmed)  03/24/2024   Pneumonia Vaccine 50+ Years old  Completed   DEXA SCAN  Completed   Hepatitis C Screening  Completed   Zoster Vaccines- Shingrix  Completed   HPV VACCINES  Aged Out    Health Maintenance  Health Maintenance Due  Topic Date Due   MAMMOGRAM  03/24/2011   FOOT EXAM   10/02/2019   COVID-19 Vaccine (4 - Booster for Pfizer series) 02/13/2020   INFLUENZA VACCINE  10/18/2020    Colorectal cancer screening: Type of screening: Colonoscopy. Completed 03/24/14. Repeat every 10 years  Mammogram status: No longer required due to double mastectomy.  Bone Density status: Ordered 03/08/21. Pt provided with contact info and advised to call to schedule appt.  Lung Cancer Screening: (Low Dose CT Chest recommended if Age 32-80 years, 30 pack-year currently smoking OR have quit w/in 15years.) does not qualify.    Additional Screening:  Hepatitis C Screening: does qualify; Completed 10/19/20  Vision Screening: Recommended annual ophthalmology exams for early detection of glaucoma and other disorders of the eye. Is the patient up to date with their annual eye exam?  Yes  Who is the provider or what is the name of the office in which the patient attends annual eye exams? Dr. Zadie Rhine   Dental Screening: Recommended annual dental exams for proper oral hygiene  Community Resource Referral / Chronic Care Management: CRR required this visit?  No   CCM required this visit?  No      Plan:     I have personally reviewed and noted the following in the patients chart:   Medical and social history Use of alcohol, tobacco or illicit drugs  Current medications and supplements including opioid prescriptions.  Functional ability and status Nutritional status Physical activity Advanced directives List of other physicians Hospitalizations, surgeries, and ER visits in previous 12 months Vitals Screenings to include cognitive, depression, and falls Referrals and appointments  In addition, I have reviewed and discussed with patient certain preventive protocols, quality metrics, and best practice recommendations. A written personalized care plan for preventive services as well as general preventive health recommendations were provided to patient.   Due to this being a  telephonic visit, the after visit summary with patients personalized plan was offered to patient via mail or my-chart. Patient would like to access on my-chart.    Loma Messing, LPN   [id number]   Nurse Health Advisor  Nurse Notes: none

## 2021-03-08 ENCOUNTER — Ambulatory Visit (INDEPENDENT_AMBULATORY_CARE_PROVIDER_SITE_OTHER): Payer: Medicare Other

## 2021-03-08 VITALS — Ht 63.0 in | Wt 142.0 lb

## 2021-03-08 DIAGNOSIS — Z0000 Encounter for general adult medical examination without abnormal findings: Secondary | ICD-10-CM | POA: Diagnosis not present

## 2021-03-08 DIAGNOSIS — Z780 Asymptomatic menopausal state: Secondary | ICD-10-CM | POA: Diagnosis not present

## 2021-03-08 NOTE — Patient Instructions (Signed)
Valerie Ball , Thank you for taking time to complete your Medicare Wellness Visit. I appreciate your ongoing commitment to your health goals. Please review the following plan we discussed and let me know if I can assist you in the future.   Screening recommendations/referrals: Colonoscopy: up to date, completed 03/24/14, due 04/03/24 Mammogram: no longer required , due to mastectomy Bone Density: due, last completed 02/20/14, ordered today someone will call to schedule appointment Recommended yearly ophthalmology/optometry visit for glaucoma screening and checkup Recommended yearly dental visit for hygiene and checkup  Vaccinations: Influenza vaccine: up to date, please bring vaccine information to your next appointment Pneumococcal vaccine: up to date Tdap vaccine: up to date, completed 07/19/11, due 07/18/21 Shingles vaccine: up to date   Covid-19: newest booster available at your local pharmacy  Advanced directives: Please bring a copy of Living Will and/or [place] for your chart.   Conditions/risks identified: see problem list  Next appointment: Follow up in one year for your annual wellness visit 03/10/22 @ 9:45am, this will be a telephone visit.    Preventive Care 27 Years and Older, Female Preventive care refers to lifestyle choices and visits with your health care provider that can promote health and wellness. What does preventive care include? A yearly physical exam. This is also called an annual well check. Dental exams once or twice a year. Routine eye exams. Ask your health care provider how often you should have your eyes checked. Personal lifestyle choices, including: Daily care of your teeth and gums. Regular physical activity. Eating a healthy diet. Avoiding tobacco and drug use. Limiting alcohol use. Practicing safe sex. Taking low-dose aspirin every day. Taking vitamin and mineral supplements as recommended by your health care provider. What happens  during an annual well check? The services and screenings done by your health care provider during your annual well check will depend on your age, overall health, lifestyle risk factors, and family history of disease. Counseling  Your health care provider may ask you questions about your: Alcohol use. Tobacco use. Drug use. Emotional well-being. Home and relationship well-being. Sexual activity. Eating habits. History of falls. Memory and ability to understand (cognition). Work and work Statistician. Reproductive health. Screening  You may have the following tests or measurements: Height, weight, and BMI. Blood pressure. Lipid and cholesterol levels. These may be checked every 5 years, or more frequently if you are over 94 years old. Skin check. Lung cancer screening. You may have this screening every year starting at age 37 if you have a 30-pack-year history of smoking and currently smoke or have quit within the past 15 years. Fecal occult blood test (FOBT) of the stool. You may have this test every year starting at age 66. Flexible sigmoidoscopy or colonoscopy. You may have a sigmoidoscopy every 5 years or a colonoscopy every 10 years starting at age 39. Hepatitis C blood test. Hepatitis B blood test. Sexually transmitted disease (STD) testing. Diabetes screening. This is done by checking your blood sugar (glucose) after you have not eaten for a while (fasting). You may have this done every 1-3 years. Bone density scan. This is done to screen for osteoporosis. You may have this done starting at age 85. Mammogram. This may be done every 1-2 years. Talk to your health care provider about how often you should have regular mammograms. Talk with your health care provider about your test results, treatment options, and if necessary, the need for more tests. Vaccines  Your health care  provider may recommend certain vaccines, such as: Influenza vaccine. This is recommended every  year. Tetanus, diphtheria, and acellular pertussis (Tdap, Td) vaccine. You may need a Td booster every 10 years. Zoster vaccine. You may need this after age 49. Pneumococcal 13-valent conjugate (PCV13) vaccine. One dose is recommended after age 68. Pneumococcal polysaccharide (PPSV23) vaccine. One dose is recommended after age 35. Talk to your health care provider about which screenings and vaccines you need and how often you need them. This information is not intended to replace advice given to you by your health care provider. Make sure you discuss any questions you have with your health care provider. Document Released: 04/02/2015 Document Revised: 11/24/2015 Document Reviewed: 01/05/2015 Elsevier Interactive Patient Education  2017 [place] Prevention in the Home Falls can cause injuries. They can happen to people of all ages. There are many things you can do to make your home safe and to help prevent falls. What can I do on the outside of my home? Regularly fix the edges of walkways and driveways and fix any cracks. Remove anything that might make you trip as you walk through a door, such as a raised step or threshold. Trim any bushes or trees on the path to your home. Use bright outdoor lighting. Clear any walking paths of anything that might make someone trip, such as rocks or tools. Regularly check to see if handrails are loose or broken. Make sure that both sides of any steps have handrails. Any raised decks and porches should have guardrails on the edges. Have any leaves, snow, or ice cleared regularly. Use sand or salt on walking paths during winter. Clean up any spills in your garage right away. This includes oil or grease spills. What can I do in the bathroom? Use night lights. Install grab bars by the toilet and in the tub and shower. Do not use towel bars as grab bars. Use non-skid mats or decals in the tub or shower. If you need to sit down in the shower, use a  plastic, non-slip stool. Keep the floor dry. Clean up any water that spills on the floor as soon as it happens. Remove soap buildup in the tub or shower regularly. Attach bath mats securely with double-sided non-slip rug tape. Do not have throw rugs and other things on the floor that can make you trip. What can I do in the bedroom? Use night lights. Make sure that you have a light by your bed that is easy to reach. Do not use any sheets or blankets that are too big for your bed. They should not hang down onto the floor. Have a firm chair that has side arms. You can use this for support while you get dressed. Do not have throw rugs and other things on the floor that can make you trip. What can I do in the kitchen? Clean up any spills right away. Avoid walking on wet floors. Keep items that you use a lot in easy-to-reach places. If you need to reach something above you, use a strong step stool that has a grab bar. Keep electrical cords out of the way. Do not use floor polish or wax that makes floors slippery. If you must use wax, use non-skid floor wax. Do not have throw rugs and other things on the floor that can make you trip. What can I do with my stairs? Do not leave any items on the stairs. Make sure that there are handrails on both  sides of the stairs and use them. Fix handrails that are broken or loose. Make sure that handrails are as long as the stairways. Check any carpeting to make sure that it is firmly attached to the stairs. Fix any carpet that is loose or worn. Avoid having throw rugs at the top or bottom of the stairs. If you do have throw rugs, attach them to the floor with carpet tape. Make sure that you have a light switch at the top of the stairs and the bottom of the stairs. If you do not have them, ask someone to add them for you. What else can I do to help prevent falls? Wear shoes that: Do not have high heels. Have rubber bottoms. Are comfortable and fit you  well. Are closed at the toe. Do not wear sandals. If you use a stepladder: Make sure that it is fully opened. Do not climb a closed stepladder. Make sure that both sides of the stepladder are locked into place. Ask someone to hold it for you, if possible. Clearly mark and make sure that you can see: Any grab bars or handrails. First and last steps. Where the edge of each step is. Use tools that help you move around (mobility aids) if they are needed. These include: Canes. Walkers. Scooters. Crutches. Turn on the lights when you go into a dark area. Replace any light bulbs as soon as they burn out. Set up your furniture so you have a clear path. Avoid moving your furniture around. If any of your floors are uneven, fix them. If there are any pets around you, be aware of where they are. Review your medicines with your doctor. Some medicines can make you feel dizzy. This can increase your chance of falling. Ask your doctor what other things that you can do to help prevent falls. This information is not intended to replace advice given to you by your health care provider. Make sure you discuss any questions you have with your health care provider. Document Released: 12/31/2008 Document Revised: 08/12/2015 Document Reviewed: 04/10/2014 Elsevier Interactive Patient Education  2017 [employer].

## 2021-03-09 ENCOUNTER — Other Ambulatory Visit: Payer: Self-pay | Admitting: Family Medicine

## 2021-03-09 ENCOUNTER — Telehealth: Payer: Self-pay | Admitting: Family Medicine

## 2021-03-09 NOTE — Telephone Encounter (Signed)
Sent mychaert letter and also lvm for pt to call the office to schedule LAB/CPE

## 2021-03-09 NOTE — Telephone Encounter (Signed)
Last CPE was 04/01/20, please scheduled her part 2 CPE with PCP after 04/02/21 and then route back to me to refill meds

## 2021-03-23 ENCOUNTER — Other Ambulatory Visit: Payer: Self-pay | Admitting: Internal Medicine

## 2021-03-28 ENCOUNTER — Other Ambulatory Visit: Payer: Self-pay | Admitting: Family Medicine

## 2021-04-04 ENCOUNTER — Other Ambulatory Visit: Payer: Self-pay | Admitting: Family Medicine

## 2021-04-06 ENCOUNTER — Other Ambulatory Visit: Payer: Self-pay | Admitting: Family Medicine

## 2021-04-13 ENCOUNTER — Encounter: Payer: Self-pay | Admitting: Internal Medicine

## 2021-04-13 DIAGNOSIS — E1140 Type 2 diabetes mellitus with diabetic neuropathy, unspecified: Secondary | ICD-10-CM

## 2021-04-14 MED ORDER — TRULICITY 1.5 MG/0.5ML ~~LOC~~ SOAJ: 1.5000 mg | SUBCUTANEOUS | 0 refills | Status: DC

## 2021-04-18 ENCOUNTER — Other Ambulatory Visit: Payer: Self-pay | Admitting: Family Medicine

## 2021-05-02 ENCOUNTER — Other Ambulatory Visit: Payer: Self-pay

## 2021-05-02 ENCOUNTER — Inpatient Hospital Stay: Payer: Medicare Other | Attending: Oncology

## 2021-05-02 DIAGNOSIS — D696 Thrombocytopenia, unspecified: Secondary | ICD-10-CM | POA: Diagnosis not present

## 2021-05-02 DIAGNOSIS — R450 Nervousness: Secondary | ICD-10-CM | POA: Diagnosis not present

## 2021-05-02 LAB — CBC WITH DIFFERENTIAL/PLATELET
Abs Immature Granulocytes: 0.03 10*3/uL (ref 0.00–0.07)
Basophils Absolute: 0 10*3/uL (ref 0.0–0.1)
Basophils Relative: 1 %
Eosinophils Absolute: 0.1 10*3/uL (ref 0.0–0.5)
Eosinophils Relative: 2 %
HCT: 42.4 % (ref 36.0–46.0)
Hemoglobin: 13.5 g/dL (ref 12.0–15.0)
Immature Granulocytes: 1 %
Lymphocytes Relative: 27 %
Lymphs Abs: 1.1 10*3/uL (ref 0.7–4.0)
MCH: 27.1 pg (ref 26.0–34.0)
MCHC: 31.8 g/dL (ref 30.0–36.0)
MCV: 85 fL (ref 80.0–100.0)
Monocytes Absolute: 0.3 10*3/uL (ref 0.1–1.0)
Monocytes Relative: 7 %
Neutro Abs: 2.5 10*3/uL (ref 1.7–7.7)
Neutrophils Relative %: 62 %
Platelets: 94 10*3/uL — ABNORMAL LOW (ref 150–400)
RBC: 4.99 MIL/uL (ref 3.87–5.11)
RDW: 14.5 % (ref 11.5–15.5)
WBC: 3.9 10*3/uL — ABNORMAL LOW (ref 4.0–10.5)
nRBC: 0 % (ref 0.0–0.2)

## 2021-05-03 ENCOUNTER — Encounter (INDEPENDENT_AMBULATORY_CARE_PROVIDER_SITE_OTHER): Payer: Self-pay | Admitting: Ophthalmology

## 2021-05-03 ENCOUNTER — Other Ambulatory Visit: Payer: Medicare Other

## 2021-05-03 ENCOUNTER — Telehealth: Payer: Self-pay | Admitting: Family Medicine

## 2021-05-03 ENCOUNTER — Ambulatory Visit (INDEPENDENT_AMBULATORY_CARE_PROVIDER_SITE_OTHER): Payer: Medicare Other | Admitting: Ophthalmology

## 2021-05-03 DIAGNOSIS — E1169 Type 2 diabetes mellitus with other specified complication: Secondary | ICD-10-CM

## 2021-05-03 DIAGNOSIS — E1165 Type 2 diabetes mellitus with hyperglycemia: Secondary | ICD-10-CM

## 2021-05-03 DIAGNOSIS — E039 Hypothyroidism, unspecified: Secondary | ICD-10-CM

## 2021-05-03 DIAGNOSIS — H34832 Tributary (branch) retinal vein occlusion, left eye, with macular edema: Secondary | ICD-10-CM | POA: Diagnosis not present

## 2021-05-03 DIAGNOSIS — E785 Hyperlipidemia, unspecified: Secondary | ICD-10-CM

## 2021-05-03 DIAGNOSIS — E538 Deficiency of other specified B group vitamins: Secondary | ICD-10-CM

## 2021-05-03 DIAGNOSIS — D696 Thrombocytopenia, unspecified: Secondary | ICD-10-CM

## 2021-05-03 DIAGNOSIS — H43392 Other vitreous opacities, left eye: Secondary | ICD-10-CM

## 2021-05-03 DIAGNOSIS — E1140 Type 2 diabetes mellitus with diabetic neuropathy, unspecified: Secondary | ICD-10-CM

## 2021-05-03 DIAGNOSIS — I10 Essential (primary) hypertension: Secondary | ICD-10-CM

## 2021-05-03 MED ORDER — BEVACIZUMAB 2.5 MG/0.1ML IZ SOSY
2.5000 mg | PREFILLED_SYRINGE | INTRAVITREAL | Status: AC | PRN
  Administered 2021-05-03: 2.5 mg via INTRAVITREAL

## 2021-05-03 NOTE — Assessment & Plan Note (Signed)
No detectable diabetic retinopathy right eye. 

## 2021-05-03 NOTE — Assessment & Plan Note (Signed)
OS, with minor recurrence of CME from BRVO adjacent to FAZ superiorly.  At 43-month follow-up today, will repeat injection to resolve the CME and shorten examination interval to 14 weeks

## 2021-05-03 NOTE — Assessment & Plan Note (Signed)
Stable OS 

## 2021-05-03 NOTE — Progress Notes (Signed)
05/03/2021     CHIEF COMPLAINT Patient presents for  Chief Complaint  Patient presents with   Branch Retinal Vein Occlusion      HISTORY OF PRESENT ILLNESS: Valerie Ball is a 76 y.o. female who presents to the clinic today for:   HPI   4 mos fu OU oct Avastin OS. BRVO w/ Macular Edema OS. Pt states "I think my vision is getting worse up close. I can't see the computer, I have to get it close to me. In the grocery store I can't read prices. I went to Dr. Gershon Crane and got glasses but I can't tell if they help a lot. I think they are just for distance and I need reading. It has probably been 6-8 months since then." Pt denies new FOL or floaters, current floaters are longstanding and stable.  Last edited by Laurin Coder on 05/03/2021  8:27 AM.      Referring physician: Rutherford Guys, [place],  [place] [address]  HISTORICAL INFORMATION:   Selected notes from the MEDICAL RECORD NUMBER    Lab Results  Component Value Date   HGBA1C 6.1 (A) 03/03/2021     CURRENT MEDICATIONS: No current outpatient medications on file. (Ophthalmic Drugs)   No current facility-administered medications for this visit. (Ophthalmic Drugs)   Current Outpatient Medications (Other)  Medication Sig   Alpha-Lipoic Acid 600 MG TABS Take 600 mg by mouth 2 (two) times daily.   amLODipine (NORVASC) 5 MG tablet TAKE 1 TABLET DAILY   Dulaglutide (TRULICITY) 1.5 JK/9.3OI SOPN Inject 1.5 mg into the skin once a week.   ezetimibe (ZETIA) 10 MG tablet TAKE 1 TABLET DAILY   fluticasone (FLONASE) 50 MCG/ACT nasal spray USE 2 SPRAYS IN EACH NOSTRIL DAILY AS NEEDED FOR ALLERGIES   hydrochlorothiazide (HYDRODIURIL) 25 MG tablet TAKE 1 TABLET DAILY   JARDIANCE 25 MG TABS tablet TAKE 1 TABLET DAILY BEFORE BREAKFAST   levothyroxine (SYNTHROID) 50 MCG tablet TAKE 1 TABLET DAILY BEFORE BREAKFAST   losartan (COZAAR) 100 MG tablet TAKE 1 TABLET DAILY   Multiple Vitamin (MULTIVITAMIN WITH  MINERALS) TABS tablet Take 1 tablet by mouth daily.   naproxen sodium (ALEVE) 220 MG tablet Take 220 mg by mouth daily as needed (pain).   omeprazole (PRILOSEC) 20 MG capsule Take 1 capsule (20 mg total) by mouth daily.   repaglinide (PRANDIN) 1 MG tablet Take 1 tablet (1 mg total) by mouth daily before supper.   rOPINIRole (REQUIP) 1 MG tablet TAKE 1 TABLET AT BEDTIME   rosuvastatin (CRESTOR) 10 MG tablet TAKE 1 TABLET TWICE A WEEK   sertraline (ZOLOFT) 100 MG tablet TAKE 1 TABLET AT BEDTIME   TRULICITY 3 ZT/2.4PY SOPN INJECT 0.5 ML (3 MG) UNDER THE SKIN ONCE A WEEK   No current facility-administered medications for this visit. (Other)      REVIEW OF SYSTEMS: ROS   Negative for: Constitutional, Gastrointestinal, Neurological, Skin, Genitourinary, Musculoskeletal, HENT, Endocrine, Cardiovascular, Eyes, Respiratory, Psychiatric, Allergic/Imm, Heme/Lymph Last edited by Hurman Horn, MD on 05/03/2021  8:42 AM.       ALLERGIES Allergies  Allergen Reactions   Arimidex [Anastrozole] Other (See Comments)    Joint pain   Glipizide Other (See Comments)    Hypoglycemia    Metformin And Related Other (See Comments)    diarrhea   Penicillins Hives and Rash    PAST MEDICAL HISTORY Past Medical History:  Diagnosis Date   Allergy    allegic rhinitis  Anemia    Angiodysplasia of colon    Anxiety    Arthritis    Blood transfusion    Borderline diabetic    per patient medical history form   Breast cancer ([place]) 02/2009   breast CA L invasive ductal CA(hormone receptor positive.)   Cataract    Colon polyp    hyperplastic   Colon stricture ([place]) 01/14/2003   Diabetes mellitus    pt's medical doctor took her off diabetic meds-blood sugars have not been a problem--and medications were causing pt's sugars to be too low   Diverticulosis    Esophageal stricture 02/04/2008   GERD (gastroesophageal reflux disease)    GI bleed    Hepatic steatosis    Hiatal hernia    Hyperlipidemia     Hypertension    Hypothyroidism    IBS (irritable bowel syndrome)    Melena 01/2008   anemia transfusion   Menopause    per patient medical history form   Osteoporosis    Postmenopausal hormone therapy    per patient medical history form.   RLS (restless legs syndrome)    Salmonella 05/2000   renal effects secondary to dehydration   Seizure disorder ([place])    Seizures ([place])    one seizure several yrs ago --etiology unknown-no problem since   Past Surgical History:  Procedure Laterality Date   ABDOMINAL HYSTERECTOMY  1983   BREAST SURGERY  02/2009   breast biopsy invasive ductal CA-bilateral mastectomies   EPIGASTRIC HERNIA REPAIR  01/02/2012   Procedure: HERNIA REPAIR EPIGASTRIC ADULT;  Surgeon: Pedro Earls, MD;  Location: [facility];  Service: General;  Laterality: N/A;  Laparoscopic Repair Paraesophageal Hernia, [place]   EYE SURGERY Left 07/2017   Dr. Zadie Rhine   LAPAROSCOPIC NISSEN FUNDOPLICATION  16/96/7893   Procedure: LAPAROSCOPIC NISSEN FUNDOPLICATION;  Surgeon: Pedro Earls, MD;  Location: [facility];  Service: General;  Laterality: N/A;   MASTECTOMY  04/2009   Bilateral for ductal carcinoma on L   OVARY SURGERY  1986   ovarian tumor removed    FAMILY HISTORY Family History  Problem Relation Age of Onset   Heart attack Father    Diabetes Father    Esophageal cancer Father    Breast cancer Sister        x 2 sisters   Diabetes Sister    Hypertension Brother    Diabetes Brother    Hypertension Mother    Stroke Mother    Breast cancer Mother    Hypertension Sister     SOCIAL HISTORY Social History   Tobacco Use   Smoking status: Never   Smokeless tobacco: Never  Vaping Use   Vaping Use: Never used  Substance Use Topics   Alcohol use: No    Alcohol/week: 0.0 standard drinks   Drug use: No         OPHTHALMIC EXAM:  Base Eye Exam     Visual Acuity (ETDRS)       Right Left   Dist [place] 20/30 20/125 -1   Dist ph [place]  20/60 -2          Tonometry (Tonopen, 8:28 AM)       Right Left   Pressure 13 13         Pupils       Pupils Dark Light Shape React APD   Right PERRL 3 2 Round Brisk None   Left PERRL 3 2 Round Minimal None  Extraocular Movement       Right Left    Full Full         Neuro/Psych     Oriented x3: Yes   Mood/Affect: Normal         Dilation     Both eyes: 1.0% Mydriacyl, 2.5% Phenylephrine @ 8:28 AM           Slit Lamp and Fundus Exam     External Exam       Right Left   External Normal Normal         Slit Lamp Exam       Right Left   Lids/Lashes Normal Normal   Conjunctiva/Sclera White and quiet White and quiet   Cornea Clear Clear   Anterior Chamber Deep and quiet Deep and quiet   Iris Round and reactive Round and reactive   Lens Posterior chamber intraocular lens Posterior chamber intraocular lens   Anterior Vitreous Normal Normal         Fundus Exam       Right Left   Posterior Vitreous Posterior vitreous detachment Central vitreous floaters   Disc Normal Normal, collaterals on nerve   C/D Ratio 0.4 0.50   Macula Normal Good Focal laser, Microaneurysms, no macular thickening, no cystoid macular edema   Vessels Normal, , no DR Macular branch retinal vein occlusion superotemporal.  No CME recurrence centrally, walked superiorly, good focal superiorly   Periphery Normal Normal            IMAGING AND PROCEDURES  Imaging and Procedures for 05/03/21  Intravitreal Injection, Pharmacologic Agent - OS - Left Eye       Time Out 05/03/2021. 8:44 AM. Confirmed correct patient, procedure, site, and patient consented.   Anesthesia Topical anesthesia was used. Anesthetic medications included Lidocaine 4%.   Procedure Preparation included Ofloxacin , 10% betadine to eyelids, 5% betadine to ocular surface, Tobramycin 0.3%. A 30 gauge needle was used.   Injection: 2.5 mg bevacizumab 2.5 MG/0.1ML   Route: Intravitreal, Site: Left Eye   NDC:  [phone], Lot: [id number]   Post-op Post injection exam found visual acuity of at least counting fingers. The patient tolerated the procedure well. There were no complications. The patient received written and verbal post procedure care education. Post injection medications included ocuflox.      OCT, Retina - OU - Both Eyes       Right Eye Quality was good. Scan locations included subfoveal. Central Foveal Thickness: 252. Progression has been stable. Findings include abnormal foveal contour, no IRF, no SRF, retinal drusen .   Left Eye Quality was good. Scan locations included subfoveal. Central Foveal Thickness: 253. Progression has been stable. Findings include abnormal foveal contour, myopic contour, no SRF, no IRF.   Notes OD, retinal drusen but no active CNVM  OS with history of BRVO with CME yet stable at this time with new recurrence, small and superior, currently 69-month follow-up post antivegF, will need repeat injection OS today to control condition and shorten interval of follow-up next   OS will continue to monitor follow             ASSESSMENT/PLAN:  Branch retinal vein occlusion with macular edema of left eye OS, with minor recurrence of CME from BRVO adjacent to FAZ superiorly.  At 60-month follow-up today, will repeat injection to resolve the CME and shorten examination interval to 14 weeks  Poorly controlled type 2 diabetes mellitus with neuropathy ([place]) No detectable diabetic retinopathy right  eye  Vitreous floaters of left eye Stable OS     ICD-10-CM   1. Branch retinal vein occlusion with macular edema of left eye  H34.8320 Intravitreal Injection, Pharmacologic Agent - OS - Left Eye    OCT, Retina - OU - Both Eyes    bevacizumab (AVASTIN) SOSY 2.5 mg    2. Poorly controlled type 2 diabetes mellitus with neuropathy ([facility])  E11.40    E11.65     3. Vitreous floaters of left eye  H43.392       1.  OS overall vastly improved less CME center involved  yet recurrent superior to [place].  Currently at 15-month follow-up.  Repeat injection OS today to maintain and examination again in 14 weeks  2.  3.  Ophthalmic Meds Ordered this visit:  Meds ordered this encounter  Medications   bevacizumab (AVASTIN) SOSY 2.5 mg       Return in about 15 weeks (around 08/16/2021) for DILATE OU, AVASTIN OCT, OS.  There are no Patient Instructions on file for this visit.   Explained the diagnoses, plan, and follow up with the patient and they expressed understanding.  Patient expressed understanding of the importance of proper follow up care.   Clent Demark Rainey Rodger M.D. Diseases & Surgery of the Retina and Vitreous Retina & Diabetic [place] 05/03/21     Abbreviations: M myopia (nearsighted); A astigmatism; H hyperopia (farsighted); P presbyopia; Mrx spectacle prescription;  CTL contact lenses; OD right eye; OS left eye; OU both eyes  XT exotropia; ET esotropia; PEK punctate epithelial keratitis; PEE punctate epithelial erosions; DES dry eye syndrome; MGD meibomian gland dysfunction; ATs artificial tears; PFAT's preservative free artificial tears; [place] nuclear sclerotic cataract; PSC posterior subcapsular cataract; ERM epi-retinal membrane; PVD posterior vitreous detachment; RD retinal detachment; DM diabetes mellitus; DR diabetic retinopathy; NPDR non-proliferative diabetic retinopathy; PDR proliferative diabetic retinopathy; CSME clinically significant macular edema; DME diabetic macular edema; dbh dot blot hemorrhages; CWS cotton wool spot; POAG primary open angle glaucoma; C/D cup-to-disc ratio; HVF humphrey visual field; GVF goldmann visual field; OCT optical coherence tomography; IOP intraocular pressure; BRVO Branch retinal vein occlusion; CRVO central retinal vein occlusion; CRAO central retinal artery occlusion; BRAO branch retinal artery occlusion; RT retinal tear; SB scleral buckle; PPV pars plana vitrectomy; VH Vitreous hemorrhage; PRP panretinal laser  photocoagulation; IVK intravitreal kenalog; VMT vitreomacular traction; MH Macular hole;  NVD neovascularization of the disc; NVE neovascularization elsewhere; AREDS age related eye disease study; ARMD age related macular degeneration; POAG primary open angle glaucoma; EBMD epithelial/anterior basement membrane dystrophy; ACIOL anterior chamber intraocular lens; IOL intraocular lens; PCIOL posterior chamber intraocular lens; Phaco/IOL phacoemulsification with intraocular lens placement; [place] photorefractive keratectomy; LASIK laser assisted in situ keratomileusis; HTN hypertension; DM diabetes mellitus; COPD chronic obstructive pulmonary disease

## 2021-05-03 NOTE — Telephone Encounter (Signed)
-----   Message from Ellamae Sia sent at 04/21/2021 11:56 AM EST ----- Regarding: Lab orders for Wednesday, 2.15.23 Patient is scheduled for CPX labs, please order future labs, Thanks , Karna Christmas

## 2021-05-04 ENCOUNTER — Other Ambulatory Visit (INDEPENDENT_AMBULATORY_CARE_PROVIDER_SITE_OTHER): Payer: Medicare Other

## 2021-05-04 ENCOUNTER — Other Ambulatory Visit: Payer: Self-pay

## 2021-05-04 DIAGNOSIS — E1169 Type 2 diabetes mellitus with other specified complication: Secondary | ICD-10-CM | POA: Diagnosis not present

## 2021-05-04 DIAGNOSIS — E785 Hyperlipidemia, unspecified: Secondary | ICD-10-CM

## 2021-05-04 DIAGNOSIS — D696 Thrombocytopenia, unspecified: Secondary | ICD-10-CM

## 2021-05-04 DIAGNOSIS — I10 Essential (primary) hypertension: Secondary | ICD-10-CM

## 2021-05-04 DIAGNOSIS — E039 Hypothyroidism, unspecified: Secondary | ICD-10-CM | POA: Diagnosis not present

## 2021-05-04 DIAGNOSIS — E538 Deficiency of other specified B group vitamins: Secondary | ICD-10-CM | POA: Diagnosis not present

## 2021-05-04 LAB — LIPID PANEL
Cholesterol: 129 mg/dL (ref 0–200)
HDL: 39.6 mg/dL (ref >39.00)
LDL Cholesterol: 52 mg/dL (ref 0–99)
NonHDL: 89.55
Total CHOL/HDL Ratio: 3
Triglycerides: 188 mg/dL — ABNORMAL HIGH (ref 0.0–149.0)
VLDL: 37.6 mg/dL (ref 0.0–40.0)

## 2021-05-04 LAB — CBC WITH DIFFERENTIAL/PLATELET
Basophils Absolute: 0 10*3/uL (ref 0.0–0.1)
Basophils Relative: 0.5 % (ref 0.0–3.0)
Eosinophils Absolute: 0 10*3/uL (ref 0.0–0.7)
Eosinophils Relative: 1.3 % (ref 0.0–5.0)
HCT: 38.1 % (ref 36.0–46.0)
Hemoglobin: 12.2 g/dL (ref 12.0–15.0)
Lymphocytes Relative: 28.4 % (ref 12.0–46.0)
Lymphs Abs: 1 10*3/uL (ref 0.7–4.0)
MCHC: 32.1 g/dL (ref 30.0–36.0)
MCV: 81.3 fl (ref 78.0–100.0)
Monocytes Absolute: 0.3 10*3/uL (ref 0.1–1.0)
Monocytes Relative: 8.6 % (ref 3.0–12.0)
Neutro Abs: 2.1 10*3/uL (ref 1.4–7.7)
Neutrophils Relative %: 61.2 % (ref 43.0–77.0)
Platelets: 83 10*3/uL — ABNORMAL LOW (ref 150.0–400.0)
RBC: 4.68 Mil/uL (ref 3.87–5.11)
RDW: 15.5 % (ref 11.5–15.5)
WBC: 3.4 10*3/uL — ABNORMAL LOW (ref 4.0–10.5)

## 2021-05-04 LAB — COMPREHENSIVE METABOLIC PANEL
ALT: 20 U/L (ref 0–35)
AST: 34 U/L (ref 0–37)
Albumin: 4.4 g/dL (ref 3.5–5.2)
Alkaline Phosphatase: 80 U/L (ref 39–117)
BUN: 28 mg/dL — ABNORMAL HIGH (ref 6–23)
CO2: 26 mEq/L (ref 19–32)
Calcium: 9.2 mg/dL (ref 8.4–10.5)
Chloride: 103 mEq/L (ref 96–112)
Creatinine, Ser: 1.18 mg/dL (ref 0.40–1.20)
GFR: 45.76 mL/min — ABNORMAL LOW (ref >60.00)
Glucose, Bld: 118 mg/dL — ABNORMAL HIGH (ref 70–99)
Potassium: 4.1 mEq/L (ref 3.5–5.1)
Sodium: 142 mEq/L (ref 135–145)
Total Bilirubin: 0.3 mg/dL (ref 0.2–1.2)
Total Protein: 7.8 g/dL (ref 6.0–8.3)

## 2021-05-04 LAB — VITAMIN B12: Vitamin B-12: 275 pg/mL (ref 211–911)

## 2021-05-04 LAB — TSH: TSH: 5.5 u[IU]/mL (ref 0.35–5.50)

## 2021-05-09 ENCOUNTER — Encounter: Payer: Self-pay | Admitting: Family Medicine

## 2021-05-09 ENCOUNTER — Other Ambulatory Visit: Payer: Self-pay

## 2021-05-09 ENCOUNTER — Ambulatory Visit (INDEPENDENT_AMBULATORY_CARE_PROVIDER_SITE_OTHER): Payer: Medicare Other | Admitting: Family Medicine

## 2021-05-09 ENCOUNTER — Ambulatory Visit (INDEPENDENT_AMBULATORY_CARE_PROVIDER_SITE_OTHER)
Admission: RE | Admit: 2021-05-09 | Discharge: 2021-05-09 | Disposition: A | Discharge status: Home / Self Care | Payer: Medicare Other | Source: Ambulatory Visit | Attending: Family Medicine | Admitting: Family Medicine

## 2021-05-09 VITALS — BP 128/70 | HR 90 | Temp 98.0°F | Ht 63.25 in | Wt 150.0 lb

## 2021-05-09 DIAGNOSIS — E1143 Type 2 diabetes mellitus with diabetic autonomic (poly)neuropathy: Secondary | ICD-10-CM

## 2021-05-09 DIAGNOSIS — Z0000 Encounter for general adult medical examination without abnormal findings: Secondary | ICD-10-CM

## 2021-05-09 DIAGNOSIS — K760 Fatty (change of) liver, not elsewhere classified: Secondary | ICD-10-CM

## 2021-05-09 DIAGNOSIS — K3184 Gastroparesis: Secondary | ICD-10-CM

## 2021-05-09 DIAGNOSIS — I10 Essential (primary) hypertension: Secondary | ICD-10-CM | POA: Diagnosis not present

## 2021-05-09 DIAGNOSIS — Z853 Personal history of malignant neoplasm of breast: Secondary | ICD-10-CM

## 2021-05-09 DIAGNOSIS — D649 Anemia, unspecified: Secondary | ICD-10-CM | POA: Diagnosis not present

## 2021-05-09 DIAGNOSIS — D696 Thrombocytopenia, unspecified: Secondary | ICD-10-CM

## 2021-05-09 DIAGNOSIS — R079 Chest pain, unspecified: Secondary | ICD-10-CM | POA: Diagnosis not present

## 2021-05-09 DIAGNOSIS — R509 Fever, unspecified: Secondary | ICD-10-CM

## 2021-05-09 DIAGNOSIS — E785 Hyperlipidemia, unspecified: Secondary | ICD-10-CM

## 2021-05-09 DIAGNOSIS — E1165 Type 2 diabetes mellitus with hyperglycemia: Secondary | ICD-10-CM

## 2021-05-09 DIAGNOSIS — E039 Hypothyroidism, unspecified: Secondary | ICD-10-CM

## 2021-05-09 DIAGNOSIS — E1169 Type 2 diabetes mellitus with other specified complication: Secondary | ICD-10-CM | POA: Diagnosis not present

## 2021-05-09 DIAGNOSIS — R42 Dizziness and giddiness: Secondary | ICD-10-CM

## 2021-05-09 DIAGNOSIS — E1140 Type 2 diabetes mellitus with diabetic neuropathy, unspecified: Secondary | ICD-10-CM

## 2021-05-09 DIAGNOSIS — E1142 Type 2 diabetes mellitus with diabetic polyneuropathy: Secondary | ICD-10-CM

## 2021-05-09 DIAGNOSIS — E2839 Other primary ovarian failure: Secondary | ICD-10-CM

## 2021-05-09 DIAGNOSIS — E538 Deficiency of other specified B group vitamins: Secondary | ICD-10-CM

## 2021-05-09 NOTE — Assessment & Plan Note (Signed)
Stable  DM control is improved

## 2021-05-09 NOTE — Progress Notes (Signed)
Subjective:    Patient ID: Valerie Ball, female    DOB: 24-Oct-1945, 76 y.o.   MRN: [id number]  This visit occurred during the SARS-CoV-2 public health emergency.  Safety protocols were in place, including screening questions prior to the visit, additional usage of staff PPE, and extensive cleaning of exam room while observing appropriate contact time as indicated for disinfecting solutions.   HPI Here for health maintenance exam and to review chronic medical problems    Wt Readings from Last 3 Encounters:  05/09/21 150 lb (68 kg)  03/08/21 142 lb (64.4 kg)  03/03/21 146 lb 12.8 oz (66.6 kg)   26.36 kg/m  Eats regularly unless she sleeps through breakfast   Had amw on 12/20, reviewed   Mammogram  , no due to bilat mastectomy for breast cancer Self exam-having soreness under L arm and around side /shoulder blade/ is tender and sore spots  Sister had breast cancer as well  Also had a hysterectomy   Covid immunized Flu shot-in the fall  Tdap 07/2011  Colonoscopy 03/2014 Has h/o avm of colon   Dexa 02/2014-bmd in normal range  - will call to schedule [place]- none  Fractures - none   HTN (with eye complications) bp is stable today  No cp or palpitations or headaches or edema  No side effects to medicines  BP Readings from Last 3 Encounters:  05/09/21 128/70  03/03/21 110/68  11/01/20 120/78      Hctz 25 mg daily  Losartan 100 mg daily  Amlodipine 5 mg daily   Occ gets dizzy  Sometimes when she changes position but not always (when sitting and gets up) At time the room feels spinny when lying down   No ear trouble    Hypothyroidism  Pt has no clinical changes No change in energy level/ hair or skin/ edema and no tremor Lab Results  Component Value Date   TSH 5.50 05/04/2021   Levothyroxine 50 mcg daily   DM2 Lab Results  Component Value Date   HGBA1C 6.1 (A) 03/03/2021  Sees Dr Winnifred Friar control   Statin and arb Eye exam utd -bi retinopathy   Exam 05/03/21 Dr Zadie Rhine  Has neuropathy    Hyperlipidemia Lab Results  Component Value Date   CHOL 129 05/04/2021   CHOL 96 03/05/2020   CHOL 102 05/21/2019   Lab Results  Component Value Date   HDL 39.60 05/04/2021   HDL 31.00 (L) 03/05/2020   HDL 27.00 (L) 05/21/2019   Lab Results  Component Value Date   LDLCALC 52 05/04/2021   [place] 31 03/05/2020   Lab Results  Component Value Date   TRIG 188.0 (H) 05/04/2021   TRIG 166.0 (H) 03/05/2020   TRIG 246.0 (H) 05/21/2019   Lab Results  Component Value Date   CHOLHDL 3 05/04/2021   CHOLHDL 3 03/05/2020   CHOLHDL 4 05/21/2019   Lab Results  Component Value Date   LDLDIRECT 48.0 05/21/2019   LDLDIRECT 83.0 03/31/2019   LDLDIRECT 105.0 09/27/2018  Triglycerides vary but not too bad  Zetia 10 mg daily  Crestor 10 mg twice weekly  Some red meat  Occ bacon   No fried food    History of anemia and low platelet ct and iron   Lab Results  Component Value Date   WBC 3.4 (L) 05/04/2021   HGB 12.2 05/04/2021   HCT 38.1 05/04/2021   MCV 81.3 05/04/2021   PLT 83.0 (L) 05/04/2021    Lab  Results  Component Value Date   IRON 80 10/19/2020   TIBC 529 (H) 10/19/2020   FERRITIN 20 10/19/2020   Some bruises when she gets blood drawn , otherwise none   Runs some low grade temp -at night  Is dizzy at times   No urinary symptoms   Vit B12 def Lab Results  Component Value Date   VITAMINB12 275 05/04/2021   Oral supplementation  Not enough fluids   Fatty liver Lab Results  Component Value Date   ALT 20 05/04/2021   AST 34 05/04/2021   ALKPHOS 80 05/04/2021   BILITOT 0.3 05/04/2021    Patient Active Problem List   Diagnosis Date Noted   Postviral fatigue syndrome 09/29/2020   History of COVID-19 09/24/2020   Fever, low grade 09/24/2020   Posterior vitreous detachment of right eye 11/06/2019   Cystoid macular edema of left eye 07/14/2019   Branch retinal vein occlusion with macular edema of left eye  07/14/2019   Hypertensive retinopathy of left eye 07/14/2019   Vitreous floaters of left eye 07/14/2019   Right foot pain 04/09/2019   Fatigue 05/20/2018   Hip pain, bilateral 08/31/2017   Vitamin B12 deficiency 09/21/2016   Benign neoplasm of tongue 04/11/2016   Diabetic gastroparesis ([place]) 03/27/2016   Retinal vein occlusion, branch 01/03/2016   Alternating constipation and diarrhea 08/12/2015   AVM (arteriovenous malformation) of colon 05/18/2015   IBS (irritable bowel syndrome) 02/28/2015   Diverticulitis of colon 10/14/2014   Gout 07/22/2014   Hypothyroidism 04/23/2014   Nausea without vomiting 02/18/2014   Encounter for Medicare annual wellness exam 01/23/2014   Estrogen deficiency 01/23/2014   Colon cancer screening 01/23/2014   Tremor 01/20/2014   Dizziness 01/07/2014   Allergic rhinitis 01/07/2014   Thrombocytopenia ([place]) 11/25/2013   Diabetic neuropathy ([place]) 11/25/2013   Lap Nissen October 2013 01/19/2012   Large type III mixed hiatus hernia  12/06/2011   Stress reaction 10/25/2011   Routine general medical examination at a health care facility 07/11/2011   TRANSAMINASES, SERUM, ELEVATED 11/11/2009   History of breast cancer 08/02/2009   ESOPHAGEAL STRICTURE 03/02/2008   HIATAL HERNIA 03/02/2008   PERITONEAL ADHESIONS 03/02/2008   Anemia 02/07/2008   Poorly controlled type 2 diabetes mellitus with neuropathy ([place]) 08/19/2007   GAD (generalized anxiety disorder) 04/15/2007   DISORDERS, ORGANIC SLEEP NEC 08/23/2006   SYNDROME, RESTLESS LEGS 08/23/2006   SYNDROME, CARPAL TUNNEL 08/23/2006   Hyperlipidemia associated with type 2 diabetes mellitus ([place]) 08/22/2006   Essential hypertension 08/22/2006   ALLERGIC RHINITIS 08/22/2006   GERD 08/22/2006   Fatty liver 08/22/2006   History of seizure 08/22/2006   Past Medical History:  Diagnosis Date   Allergy    allegic rhinitis   Anemia    Angiodysplasia of colon    Anxiety    Arthritis    Blood transfusion     Borderline diabetic    per patient medical history form   Breast cancer ([place]) 02/2009   breast CA L invasive ductal CA(hormone receptor positive.)   Cataract    Colon polyp    hyperplastic   Colon stricture ([place]) 01/14/2003   Diabetes mellitus    pt's medical doctor took her off diabetic meds-blood sugars have not been a problem--and medications were causing pt's sugars to be too low   Diverticulosis    Esophageal stricture 02/04/2008   GERD (gastroesophageal reflux disease)    GI bleed    Hepatic steatosis    Hiatal hernia  Hyperlipidemia    Hypertension    Hypothyroidism    IBS (irritable bowel syndrome)    Melena 01/2008   anemia transfusion   Menopause    per patient medical history form   Osteoporosis    Postmenopausal hormone therapy    per patient medical history form.   RLS (restless legs syndrome)    Salmonella 05/2000   renal effects secondary to dehydration   Seizure disorder ([place])    Seizures ([place])    one seizure several yrs ago --etiology unknown-no problem since   Past Surgical History:  Procedure Laterality Date   ABDOMINAL HYSTERECTOMY  1983   BREAST SURGERY  02/2009   breast biopsy invasive ductal CA-bilateral mastectomies   EPIGASTRIC HERNIA REPAIR  01/02/2012   Procedure: HERNIA REPAIR EPIGASTRIC ADULT;  Surgeon: Pedro Earls, MD;  Location: [facility];  Service: General;  Laterality: N/A;  Laparoscopic Repair Paraesophageal Hernia, Nissen   EYE SURGERY Left 07/2017   Dr. Zadie Rhine   LAPAROSCOPIC NISSEN FUNDOPLICATION  85/27/7824   Procedure: LAPAROSCOPIC NISSEN FUNDOPLICATION;  Surgeon: Pedro Earls, MD;  Location: [facility];  Service: General;  Laterality: N/A;   MASTECTOMY  04/2009   Bilateral for ductal carcinoma on L   OVARY SURGERY  1986   ovarian tumor removed   Social History   Tobacco Use   Smoking status: Never   Smokeless tobacco: Never  Vaping Use   Vaping Use: Never used  Substance Use Topics   Alcohol use: No    Alcohol/week:  0.0 standard drinks   Drug use: No   Family History  Problem Relation Age of Onset   Heart attack Father    Diabetes Father    Esophageal cancer Father    Breast cancer Sister        x 2 sisters   Diabetes Sister    Hypertension Brother    Diabetes Brother    Hypertension Mother    Stroke Mother    Breast cancer Mother    Hypertension Sister    Allergies  Allergen Reactions   Arimidex [Anastrozole] Other (See Comments)    Joint pain   Glipizide Other (See Comments)    Hypoglycemia    Metformin And Related Other (See Comments)    diarrhea   Penicillins Hives and Rash   Current Outpatient Medications on File Prior to Visit  Medication Sig Dispense Refill   Alpha-Lipoic Acid 600 MG TABS Take 600 mg by mouth 2 (two) times daily. 180 tablet 3   amLODipine (NORVASC) 5 MG tablet TAKE 1 TABLET DAILY 90 tablet 1   Dulaglutide (TRULICITY) 1.5 MP/5.3IR SOPN Inject 1.5 mg into the skin once a week. 6 mL 0   ezetimibe (ZETIA) 10 MG tablet TAKE 1 TABLET DAILY 90 tablet 0   fluticasone (FLONASE) 50 MCG/ACT nasal spray USE 2 SPRAYS IN EACH NOSTRIL DAILY AS NEEDED FOR ALLERGIES 48 g 0   hydrochlorothiazide (HYDRODIURIL) 25 MG tablet TAKE 1 TABLET DAILY 90 tablet 1   JARDIANCE 25 MG TABS tablet TAKE 1 TABLET DAILY BEFORE BREAKFAST 90 tablet 3   levothyroxine (SYNTHROID) 50 MCG tablet TAKE 1 TABLET DAILY BEFORE BREAKFAST 90 tablet 1   losartan (COZAAR) 100 MG tablet TAKE 1 TABLET DAILY 90 tablet 0   Multiple Vitamin (MULTIVITAMIN WITH MINERALS) TABS tablet Take 1 tablet by mouth daily.     naproxen sodium (ALEVE) 220 MG tablet Take 220 mg by mouth daily as needed (pain).     omeprazole (PRILOSEC) 20 MG  capsule Take 1 capsule (20 mg total) by mouth daily. 90 capsule 3   repaglinide (PRANDIN) 1 MG tablet Take 1 tablet (1 mg total) by mouth daily before supper. 90 tablet 3   rOPINIRole (REQUIP) 1 MG tablet TAKE 1 TABLET AT BEDTIME 90 tablet 3   rosuvastatin (CRESTOR) 10 MG tablet TAKE 1 TABLET  TWICE A WEEK 24 tablet 3   sertraline (ZOLOFT) 100 MG tablet TAKE 1 TABLET AT BEDTIME 90 tablet 1   TRULICITY 3 RK/2.7CW SOPN INJECT 0.5 ML (3 MG) UNDER THE SKIN ONCE A WEEK 6 mL 3   [DISCONTINUED] cetirizine (ZYRTEC) 10 MG tablet Take 10 mg by mouth daily as needed. For allergy symptoms     No current facility-administered medications on file prior to visit.     Review of Systems  Constitutional:  Positive for fatigue. Negative for activity change, appetite change, fever and unexpected weight change.  HENT:  Negative for congestion, ear pain, rhinorrhea, sinus pressure and sore throat.   Eyes:  Negative for pain, redness and visual disturbance.  Respiratory:  Negative for cough, shortness of breath and wheezing.   Cardiovascular:  Negative for chest pain and palpitations.  Gastrointestinal:  Negative for abdominal pain, blood in stool, constipation and diarrhea.  Endocrine: Negative for polydipsia and polyuria.  Genitourinary:  Negative for dysuria, frequency and urgency.       Pain of L mastectomy scar area   Musculoskeletal:  Positive for arthralgias. Negative for back pain and myalgias.  Skin:  Negative for pallor and rash.  Allergic/Immunologic: Negative for environmental allergies.  Neurological:  Positive for dizziness and light-headedness. Negative for syncope and headaches.  Hematological:  Negative for adenopathy. Does not bruise/bleed easily.  Psychiatric/Behavioral:  Negative for decreased concentration and dysphoric mood. The patient is not nervous/anxious.       Objective:   Physical Exam Constitutional:      General: She is not in acute distress.    Appearance: Normal appearance. She is well-developed and normal weight. She is not ill-appearing or diaphoretic.  HENT:     Head: Normocephalic and atraumatic.     Right Ear: Tympanic membrane, ear canal and external ear normal.     Left Ear: Tympanic membrane, ear canal and external ear normal.     Nose: Nose normal. No  congestion.     Mouth/Throat:     Mouth: Mucous membranes are moist.     Pharynx: Oropharynx is clear. No posterior oropharyngeal erythema.  Eyes:     General: No scleral icterus.    Extraocular Movements: Extraocular movements intact.     Conjunctiva/sclera: Conjunctivae normal.     Pupils: Pupils are equal, round, and reactive to light.  Neck:     Thyroid: No thyromegaly.     Vascular: No carotid bruit or JVD.  Cardiovascular:     Rate and Rhythm: Normal rate and regular rhythm.     Pulses: Normal pulses.     Heart sounds: Normal heart sounds.    No gallop.  Pulmonary:     Effort: Pulmonary effort is normal. No respiratory distress.     Breath sounds: Normal breath sounds. No wheezing.     Comments: Good air exch Chest:     Chest wall: No tenderness.  Abdominal:     General: Bowel sounds are normal. There is no distension or abdominal bruit.     Palpations: Abdomen is soft. There is no mass.     Tenderness: There is no abdominal tenderness.  Hernia: No hernia is present.  Genitourinary:    Comments: S/p mastectomy  No change in surgical sites, but some tenderness over L cw  Musculoskeletal:        General: No tenderness. Normal range of motion.     Cervical back: Normal range of motion and neck supple. No rigidity. No muscular tenderness.     Right lower leg: No edema.     Left lower leg: No edema.  Lymphadenopathy:     Cervical: No cervical adenopathy.  Skin:    General: Skin is warm and dry.     Coloration: Skin is not pale.     Findings: No erythema or rash.     Comments: Solar lentigines diffusely   Neurological:     Mental Status: She is alert. Mental status is at baseline.     Cranial Nerves: No cranial nerve deficit.     Motor: No abnormal muscle tone.     Coordination: Coordination normal.     Gait: Gait normal.     Deep Tendon Reflexes: Reflexes are normal and symmetric. Reflexes normal.  Psychiatric:        Mood and Affect: Mood normal.         Cognition and Memory: Cognition and memory normal.          Assessment & Plan:   Problem List Items Addressed This Visit       Cardiovascular and Mediastinum   Essential hypertension    bp in fair control at this time  BP Readings from Last 1 Encounters:  05/09/21 128/70  No changes needed Most recent labs reviewed  Disc lifstyle change with low sodium diet and exercise  Hctz 25 mg daily  Losartan 100 mg daily  Amlodipine 5 mg daily         Digestive   Diabetic gastroparesis (HCC)    Fairly stable, tolerable      Fatty liver    Nl LFTs        Endocrine   Diabetic neuropathy ([place])    Stable  DM control is improved      Hyperlipidemia associated with type 2 diabetes mellitus ([place])    Disc goals for lipids and reasons to control them Rev last labs with pt Rev low sat fat diet in detail Taking zetia 10 mg daily  crestor 10 mg twice weekly  LDL of 52 Disc avoiding red meat      Hypothyroidism    Hypothyroidism  Pt has no clinical changes No change in energy level/ hair or skin/ edema and no tremor Lab Results  Component Value Date   TSH 5.50 05/04/2021   plan to continue levothyroxine 50 mcg daily        Poorly controlled type 2 diabetes mellitus with neuropathy (HCC)    Lab Results  Component Value Date   HGBA1C 6.1 (A) 03/03/2021  Stable Under care of endocrinology  Has freq eye exams Takes statin and arb Nl foot exam        Hematopoietic and Hemostatic   Thrombocytopenia ([place])    Platelet ct of 83 now  More bruising Also fatigue and some low grade temps  Wbc 3.4   Ref made to hematology       Relevant Orders   Ambulatory referral to Hematology / Oncology     Other   Anemia    Stable but plt lower  Ref made to hematology      Dizziness    Vague/ at times light  headed and other time spinning F/u for further eval      Estrogen deficiency   Fever, low grade    This continues  Ref made to hematology      Relevant  Orders   Ambulatory referral to Hematology / Oncology   DG Chest 2 View (Completed)   History of breast cancer    Some discomfort L chest wall cxr ordered today Reassuring exam      Relevant Orders   DG Chest 2 View (Completed)   Routine general medical examination at a health care facility - Primary    Reviewed health habits including diet and exercise and skin cancer prevention Reviewed appropriate screening tests for age  Also reviewed health mt list, fam hx and immunization status , as well as social and family history   See HPI Labs reviewed amw reviewed  H/o mastectomy/no mammogram Colonoscopy utd dexa ordered-pt will schedule      Vitamin B12 deficiency    Lab Results  Component Value Date   VITAMINB12 275 05/04/2021  Out of her supplement inst to get back on 1000 mcg daily

## 2021-05-09 NOTE — Assessment & Plan Note (Signed)
Lab Results  Component Value Date   HGBA1C 6.1 (A) 03/03/2021   Stable Under care of endocrinology  Has freq eye exams Takes statin and arb Nl foot exam

## 2021-05-09 NOTE — Assessment & Plan Note (Signed)
Disc goals for lipids and reasons to control them Rev last labs with pt Rev low sat fat diet in detail Taking zetia 10 mg daily  crestor 10 mg twice weekly  LDL of 52 Disc avoiding red meat

## 2021-05-09 NOTE — Assessment & Plan Note (Signed)
This continues  Ref made to hematology

## 2021-05-09 NOTE — Assessment & Plan Note (Signed)
bp in fair control at this time  BP Readings from Last 1 Encounters:  05/09/21 128/70   No changes needed Most recent labs reviewed  Disc lifstyle change with low sodium diet and exercise  Hctz 25 mg daily  Losartan 100 mg daily  Amlodipine 5 mg daily

## 2021-05-09 NOTE — Assessment & Plan Note (Signed)
Stable but plt lower  Ref made to hematology

## 2021-05-09 NOTE — Assessment & Plan Note (Signed)
Fairly stable, tolerable

## 2021-05-09 NOTE — Assessment & Plan Note (Signed)
Hypothyroidism  Pt has no clinical changes No change in energy level/ hair or skin/ edema and no tremor Lab Results  Component Value Date   TSH 5.50 05/04/2021   plan to continue levothyroxine 50 mcg daily

## 2021-05-09 NOTE — Assessment & Plan Note (Signed)
Nl LFTs

## 2021-05-09 NOTE — Patient Instructions (Addendum)
Get back on B12  1000 mcg daily   Schedule your bone density test   Work on fluid intake 64 oz daily-mostly water  More water/less soda   If you do not hear about the hematology referral in 1-2 weeks please call us   Follow up to discuss dizziness when convenient for you (sooner if worse)  Be careful not to fall

## 2021-05-09 NOTE — Assessment & Plan Note (Signed)
Platelet ct of 83 now  More bruising Also fatigue and some low grade temps  Wbc 3.4   Ref made to hematology

## 2021-05-09 NOTE — Assessment & Plan Note (Signed)
Vague/ at times light headed and other time spinning F/u for further eval

## 2021-05-09 NOTE — Assessment & Plan Note (Signed)
Lab Results  Component Value Date   VITAMINB12 275 05/04/2021   Out of her supplement inst to get back on 1000 mcg daily

## 2021-05-09 NOTE — Assessment & Plan Note (Signed)
Reviewed health habits including diet and exercise and skin cancer prevention Reviewed appropriate screening tests for age  Also reviewed health mt list, fam hx and immunization status , as well as social and family history   See HPI Labs reviewed amw reviewed  H/o mastectomy/no mammogram Colonoscopy utd dexa ordered-pt will schedule

## 2021-05-09 NOTE — Assessment & Plan Note (Addendum)
Some discomfort L chest wall cxr ordered today Reassuring exam

## 2021-05-16 ENCOUNTER — Inpatient Hospital Stay: Payer: Medicare Other

## 2021-05-16 ENCOUNTER — Encounter: Payer: Self-pay | Admitting: Hematology and Oncology

## 2021-05-16 ENCOUNTER — Inpatient Hospital Stay (INDEPENDENT_AMBULATORY_CARE_PROVIDER_SITE_OTHER): Payer: Medicare Other | Admitting: Hematology and Oncology

## 2021-05-16 ENCOUNTER — Other Ambulatory Visit: Payer: Self-pay | Admitting: Hematology and Oncology

## 2021-05-16 ENCOUNTER — Other Ambulatory Visit: Payer: Self-pay

## 2021-05-16 VITALS — BP 137/69 | HR 90 | Temp 98.7°F | Resp 18 | Ht 63.0 in | Wt 150.9 lb

## 2021-05-16 DIAGNOSIS — R509 Fever, unspecified: Secondary | ICD-10-CM | POA: Diagnosis not present

## 2021-05-16 DIAGNOSIS — Z808 Family history of malignant neoplasm of other organs or systems: Secondary | ICD-10-CM

## 2021-05-16 DIAGNOSIS — D696 Thrombocytopenia, unspecified: Secondary | ICD-10-CM

## 2021-05-16 DIAGNOSIS — Z8249 Family history of ischemic heart disease and other diseases of the circulatory system: Secondary | ICD-10-CM

## 2021-05-16 DIAGNOSIS — Z823 Family history of stroke: Secondary | ICD-10-CM | POA: Diagnosis not present

## 2021-05-16 DIAGNOSIS — R450 Nervousness: Secondary | ICD-10-CM | POA: Diagnosis not present

## 2021-05-16 DIAGNOSIS — R42 Dizziness and giddiness: Secondary | ICD-10-CM

## 2021-05-16 DIAGNOSIS — Z803 Family history of malignant neoplasm of breast: Secondary | ICD-10-CM | POA: Diagnosis not present

## 2021-05-16 DIAGNOSIS — Z79899 Other long term (current) drug therapy: Secondary | ICD-10-CM | POA: Diagnosis not present

## 2021-05-16 DIAGNOSIS — Z833 Family history of diabetes mellitus: Secondary | ICD-10-CM

## 2021-05-16 LAB — VITAMIN B12: Vitamin B-12: 245 pg/mL (ref 180–914)

## 2021-05-16 LAB — CBC AND DIFFERENTIAL
HCT: 43 (ref 36–46)
Hemoglobin: 13.9 (ref 12.0–16.0)
Neutrophils Absolute: 3.84
Platelets: 123 — AB (ref 150–399)
WBC: 6

## 2021-05-16 LAB — BASIC METABOLIC PANEL
BUN: 22 — AB (ref 4–21)
CO2: 25 — AB (ref 13–22)
Chloride: 106 (ref 99–108)
Creatinine: 1 (ref 0.5–1.1)
Glucose: 139
Potassium: 3.8 (ref 3.4–5.3)
Sodium: 141 (ref 137–147)

## 2021-05-16 LAB — HEPATIC FUNCTION PANEL
ALT: 33 (ref 7–35)
AST: 51 — AB (ref 13–35)
Alkaline Phosphatase: 96 (ref 25–125)
Bilirubin, Total: 0.6

## 2021-05-16 LAB — COMPREHENSIVE METABOLIC PANEL
Albumin: 4.6 (ref 3.5–5.0)
Calcium: 8.8 (ref 8.7–10.7)

## 2021-05-16 LAB — TSH: TSH: 2.788 u[IU]/mL (ref 0.350–4.500)

## 2021-05-16 LAB — FOLATE: Folate: 13.6 ng/mL (ref >5.9)

## 2021-05-16 LAB — CBC: RBC: 5.21 — AB (ref 3.87–5.11)

## 2021-05-16 NOTE — Progress Notes (Cosign Needed)
[facility]  [address] [place],  [place]  [address] [phone]  Clinic Day:  05/16/2021  Referring physician: Tower, Wynelle Fanny, MD   REASON FOR CONSULTATION:  Thrombocytopenia with low grade fevers  HISTORY OF PRESENT ILLNESS:  Valerie Ball is a 76 y.o. female with a history of thrombocytopenia and low grade fevers who is referred in consultation by Loura Pardon, MD for assessment and management. She has a history of thrombocytopenia over several years with the highest platelet count noted in 2017 at 137 and lowest at 83 this month. She has not had extensive workup related to this and does not note bleeding. She does note easier bruising when levels are lower. In regards to her fevers, she notes over the last month, occasional fevers as high as 100.2. She reports no signs or symptoms of infection in relation to these fevers. She was given a course of antibiotics without improvement. She also notes instances of dizziness which was contributed to dehydration by her PCP.  Today she denies fever, chills, nausea or vomiting. She denies shortness of breath, chest pain or cough. She denies issue with bowel or bladder. Her appetite is good and her weight is stable. She does have a personal history of breast cancer over 10 years ago that was treated with mastectomy and tamoxifen for five years. She denies any signs of recurrence. Her medical history is also significant for anxiety, diabetes, hypothyroidism, fatty liver, hypertension, hyperlipidemia, anxiety and IBS. Surgical history is significant for hysterectomy, mastectomy, tubal ligation, hernia repair and ganglion cyst. Family history is significant for esophageal cancer, breast cancer, diabetes and chronic kidney disease. She is a retired [employer] officer, married with 3 children and lives with her husband. She denies the use of tobacco or other substances with occasional alcohol use.    REVIEW OF SYSTEMS:   Review of Systems  Constitutional:  Positive for fever. Negative for appetite change, chills, diaphoresis, fatigue and unexpected weight change.  HENT:   Negative for hearing loss, lump/mass, mouth sores, nosebleeds, sore throat, tinnitus, trouble swallowing and voice change.   Eyes:  Negative for eye problems and icterus.  Respiratory:  Negative for chest tightness, cough, hemoptysis, shortness of breath and wheezing.   Cardiovascular:  Negative for chest pain, leg swelling and palpitations.  Gastrointestinal:  Negative for abdominal distention, abdominal pain, blood in stool, constipation, diarrhea, nausea, rectal pain and vomiting.  Endocrine: Negative for hot flashes.  Genitourinary:  Negative for bladder incontinence, difficulty urinating, dyspareunia, dysuria, frequency, hematuria and nocturia.   Musculoskeletal:  Negative for arthralgias, back pain, flank pain, gait problem, myalgias, neck pain and neck stiffness.  Skin:  Negative for itching, rash and wound.  Neurological:  Positive for dizziness. Negative for extremity weakness, gait problem, headaches, light-headedness, numbness, seizures and speech difficulty.  Hematological:  Negative for adenopathy. Does not bruise/bleed easily.  Psychiatric/Behavioral:  Negative for confusion, decreased concentration, depression, sleep disturbance and suicidal ideas. The patient is nervous/anxious.     VITALS:  Blood pressure 137/69, pulse 90, temperature 98.7 F (37.1 C), temperature source Oral, resp. rate 18, height 5\' 3"  (1.6 m), weight 150 lb 14.4 oz (68.4 kg), last menstrual period 03/20/1981, SpO2 94 %.  Wt Readings from Last 3 Encounters:  05/16/21 150 lb 14.4 oz (68.4 kg)  05/09/21 150 lb (68 kg)  03/08/21 142 lb (64.4 kg)    Body mass index is 26.73 kg/m.  Performance status (ECOG): 1 - Symptomatic but completely ambulatory  PHYSICAL EXAM:  Physical Exam Constitutional:      General: She is not in acute distress.     Appearance: Normal appearance. She is normal weight. She is not ill-appearing, toxic-appearing or diaphoretic.  HENT:     Head: Normocephalic and atraumatic.     Right Ear: Tympanic membrane normal.     Left Ear: Tympanic membrane normal.     Nose: Nose normal. No congestion or rhinorrhea.     Mouth/Throat:     Mouth: Mucous membranes are moist.     Pharynx: Oropharynx is clear. No oropharyngeal exudate or posterior oropharyngeal erythema.  Eyes:     General: No scleral icterus.       Right eye: No discharge.        Left eye: No discharge.     Extraocular Movements: Extraocular movements intact.     Conjunctiva/sclera: Conjunctivae normal.     Pupils: Pupils are equal, round, and reactive to light.  Neck:     Vascular: No carotid bruit.  Cardiovascular:     Rate and Rhythm: Normal rate and regular rhythm.     Heart sounds: No murmur heard.   No friction rub. No gallop.  Pulmonary:     Effort: Pulmonary effort is normal. No respiratory distress.     Breath sounds: Normal breath sounds. No stridor. No wheezing, rhonchi or rales.  Chest:     Chest wall: No tenderness.  Abdominal:     General: Abdomen is flat. Bowel sounds are normal. There is no distension.     Palpations: There is no mass.     Tenderness: There is no abdominal tenderness. There is no right CVA tenderness, left CVA tenderness, guarding or rebound.     Hernia: No hernia is present.  Musculoskeletal:        General: No swelling, tenderness, deformity or signs of injury. Normal range of motion.     Cervical back: Normal range of motion and neck supple. No rigidity or tenderness.     Right lower leg: No edema.     Left lower leg: No edema.  Lymphadenopathy:     Cervical: No cervical adenopathy.  Skin:    General: Skin is warm and dry.     Capillary Refill: Capillary refill takes less than 2 seconds.     Coloration: Skin is not jaundiced or pale.     Findings: No bruising, erythema, lesion or rash.  Neurological:      General: No focal deficit present.     Mental Status: She is alert and oriented to person, place, and time. Mental status is at baseline.     Cranial Nerves: No cranial nerve deficit.     Sensory: No sensory deficit.     Motor: No weakness.     Coordination: Coordination normal.     Gait: Gait normal.     Deep Tendon Reflexes: Reflexes normal.  Psychiatric:        Mood and Affect: Mood normal.        Behavior: Behavior normal.        Thought Content: Thought content normal.        Judgment: Judgment normal.     LABS:   CBC Latest Ref Rng & Units 05/16/2021 05/04/2021 05/02/2021  WBC - 6.0 3.4(L) 3.9(L)  Hemoglobin 12.0 - 16.0 13.9 12.2 13.5  Hematocrit 36 - 46 43 38.1 42.4  Platelets 150 - 399 123(A) 83.0(L) 94(L)   CMP Latest Ref Rng & Units 05/16/2021 05/04/2021 10/19/2020  Glucose 70 - 99 mg/dL - 118(H) 148(H)  BUN 4 - 21 22(A) 28(H) 26(H)  Creatinine 0.5 - 1.1 1.0 1.18 0.95  Sodium 137 - 147 141 142 139  Potassium 3.4 - 5.3 3.8 4.1 3.9  Chloride 99 - 108 106 103 100  CO2 13 - 22 25(A) 26 29  Calcium 8.7 - 10.7 8.8 9.2 9.4  Total Protein 6.0 - 8.3 g/dL - 7.8 8.4(H)  Total Bilirubin 0.2 - 1.2 mg/dL - 0.3 0.9  Alkaline Phos 25 - 125 96 80 85  AST 13 - 35 51(A) 34 34  ALT 7 - 35 33 20 22     No results found for: CEA1 / No results found for: CEA1 No results found for: PSA1 No results found for: CAN199 No results found for: PPJ093  Lab Results  Component Value Date   TOTALPROTELP 7.6 02/04/2014   ALBUMINELP 52.9 (L) 02/04/2014   A1GS 5.0 (H) 02/04/2014   A2GS 10.2 02/04/2014   BETS 9.2 (H) 02/04/2014   BETA2SER 6.4 02/04/2014   GAMS 16.3 02/04/2014   MSPIKE NOT DET 02/04/2014   SPEI SEE NOTE 02/04/2014   Lab Results  Component Value Date   TIBC 529 (H) 10/19/2020   TIBC 508 (H) 11/07/2016   TIBC 417 08/21/2012   FERRITIN 20 10/19/2020   FERRITIN 20.5 03/05/2020   FERRITIN 21.0 05/20/2018   IRONPCTSAT 15 10/19/2020   IRONPCTSAT 11 11/07/2016   IRONPCTSAT  11.9 (L) 04/06/2015   No results found for: LDH  STUDIES:  DG Chest 2 View  Result Date: 05/09/2021 CLINICAL DATA:  76 year old female with chest pain EXAM: CHEST - 2 VIEW COMPARISON:  09/24/2020 FINDINGS: Cardiomediastinal silhouette unchanged in size and contour. No evidence of central vascular congestion. No interlobular septal thickening. Similar appearance of asymmetric elevation the right hemidiaphragm No pneumothorax or pleural effusion. Coarsened interstitial markings, with no confluent airspace disease. No acute displaced fracture. Degenerative changes of the spine. Bilateral mastectomy IMPRESSION: Negative for acute cardiopulmonary disease Electronically Signed   By: Corrie Mckusick D.O.   On: 05/09/2021 15:56   Intravitreal Injection, Pharmacologic Agent - OS - Left Eye  Result Date: 05/03/2021 Time Out 05/03/2021. 8:44 AM. Confirmed correct patient, procedure, site, and patient consented. Anesthesia Topical anesthesia was used. Anesthetic medications included Lidocaine 4%. Procedure Preparation included Ofloxacin , 10% betadine to eyelids, 5% betadine to ocular surface, Tobramycin 0.3%. A 30 gauge needle was used. Injection: 2.5 mg bevacizumab 2.5 MG/0.1ML   Route: Intravitreal, Site: Left Eye   NDC: [phone], Lot: [id number] Post-op Post injection exam found visual acuity of at least counting fingers. The patient tolerated the procedure well. There were no complications. The patient received written and verbal post procedure care education. Post injection medications included ocuflox.   OCT, Retina - OU - Both Eyes  Result Date: 05/03/2021 Right Eye Quality was good. Scan locations included subfoveal. Central Foveal Thickness: 252. Progression has been stable. Findings include abnormal foveal contour, no IRF, no SRF, retinal drusen . Left Eye Quality was good. Scan locations included subfoveal. Central Foveal Thickness: 253. Progression has been stable. Findings include abnormal foveal  contour, myopic contour, no SRF, no IRF. Notes OD, retinal drusen but no active CNVM OS with history of BRVO with CME yet stable at this time with new recurrence, small and superior, currently 25-month follow-up post antivegF, will need repeat injection OS today to control condition and shorten interval of follow-up next OS will continue to monitor follow  HISTORY:   Past Medical History:  Diagnosis Date   Allergy    allegic rhinitis   Anemia    Angiodysplasia of colon    Anxiety    Arthritis    AVM (arteriovenous malformation) of colon    Blood transfusion    Borderline diabetic    per patient medical history form   Breast cancer ([facility]) 02/2009   breast CA L invasive ductal CA(hormone receptor positive.)   Cataract    Colon polyp    hyperplastic   Colon stricture ([facility]) 01/14/2003   Diabetes mellitus    pt's medical doctor took her off diabetic meds-blood sugars have not been a problem--and medications were causing pt's sugars to be too low   Diverticulosis    Esophageal stricture 02/04/2008   Fatty liver    GERD (gastroesophageal reflux disease)    GI bleed    Gout    Hepatic steatosis    Hiatal hernia    Hyperlipidemia    Hypertension    Hypothyroidism    IBS (irritable bowel syndrome)    Macular edema    left eye   Melena 01/2008   anemia transfusion   Menopause    per patient medical history form   Osteoporosis    Postmenopausal hormone therapy    per patient medical history form.   RLS (restless legs syndrome)    Salmonella 05/2000   renal effects secondary to dehydration   Seizure disorder ([facility])    Seizures ([facility])    one seizure several yrs ago --etiology unknown-no problem since   Vitamin B 12 deficiency     Past Surgical History:  Procedure Laterality Date   ABDOMINAL HYSTERECTOMY  1983   BREAST SURGERY  02/2009   breast biopsy invasive ductal CA-bilateral mastectomies   EPIGASTRIC HERNIA REPAIR  01/02/2012   Procedure: HERNIA REPAIR EPIGASTRIC  ADULT;  Surgeon: Pedro Earls, MD;  Location: [facility];  Service: General;  Laterality: N/A;  Laparoscopic Repair Paraesophageal Hernia, [place]   EYE SURGERY Left 07/2017   Dr. Donny Pique CYST EXCISION     LAPAROSCOPIC NISSEN FUNDOPLICATION  33/35/4562   Procedure: LAPAROSCOPIC NISSEN FUNDOPLICATION;  Surgeon: Pedro Earls, MD;  Location: [facility];  Service: General;  Laterality: N/A;   MASTECTOMY  04/2009   Bilateral for ductal carcinoma on L   OVARY SURGERY  1986   ovarian tumor removed   TUBAL LIGATION      Family History  Problem Relation Age of Onset   Hypertension Mother    Stroke Mother    Breast cancer Mother    Heart attack Father    Diabetes Father    Esophageal cancer Father    Breast cancer Sister        x 2 sisters   Diabetes Sister    Hypertension Sister    Breast cancer Sister    Hypertension Brother    Diabetes Brother     Social History:  reports that she has never smoked. She has never used smokeless tobacco. She reports current alcohol use. She reports that she does not use drugs.The patient is accompanied by husband today.  Allergies:  Allergies  Allergen Reactions   Arimidex [Anastrozole] Other (See Comments)    Joint pain   Glipizide Other (See Comments)    Hypoglycemia    Metformin And Related Other (See Comments)    diarrhea   Penicillins Hives and Rash    Current Medications: Current Outpatient Medications  Medication Sig Dispense  Refill   Alpha-Lipoic Acid 600 MG TABS Take 600 mg by mouth 2 (two) times daily. 180 tablet 3   amLODipine (NORVASC) 5 MG tablet TAKE 1 TABLET DAILY 90 tablet 1   Dulaglutide (TRULICITY) 1.5 KD/3.2IZ SOPN Inject 1.5 mg into the skin once a week. 6 mL 0   ezetimibe (ZETIA) 10 MG tablet TAKE 1 TABLET DAILY 90 tablet 0   fluticasone (FLONASE) 50 MCG/ACT nasal spray USE 2 SPRAYS IN EACH NOSTRIL DAILY AS NEEDED FOR ALLERGIES 48 g 0   hydrochlorothiazide (HYDRODIURIL) 25 MG tablet TAKE 1 TABLET DAILY 90  tablet 1   JARDIANCE 25 MG TABS tablet TAKE 1 TABLET DAILY BEFORE BREAKFAST 90 tablet 3   levothyroxine (SYNTHROID) 50 MCG tablet TAKE 1 TABLET DAILY BEFORE BREAKFAST 90 tablet 1   losartan (COZAAR) 100 MG tablet TAKE 1 TABLET DAILY 90 tablet 0   Multiple Vitamin (MULTIVITAMIN WITH MINERALS) TABS tablet Take 1 tablet by mouth daily.     naproxen sodium (ALEVE) 220 MG tablet Take 220 mg by mouth daily as needed (pain).     omeprazole (PRILOSEC) 20 MG capsule Take 1 capsule (20 mg total) by mouth daily. 90 capsule 3   repaglinide (PRANDIN) 1 MG tablet Take 1 tablet (1 mg total) by mouth daily before supper. 90 tablet 3   rOPINIRole (REQUIP) 1 MG tablet TAKE 1 TABLET AT BEDTIME 90 tablet 3   rosuvastatin (CRESTOR) 10 MG tablet TAKE 1 TABLET TWICE A WEEK 24 tablet 3   sertraline (ZOLOFT) 100 MG tablet TAKE 1 TABLET AT BEDTIME 90 tablet 1   TRULICITY 3 TI/4.5YK SOPN INJECT 0.5 ML (3 MG) UNDER THE SKIN ONCE A WEEK 6 mL 3   No current facility-administered medications for this visit.     ASSESSMENT & PLAN:   Assessment:  NAKSHATRA KLOSE is a 76 y.o. female who was sent for further evaluation regarding thrombocytopenia and fevers. She is noted to have lower platelet counts over the past several years as high as 137 in 2017 and as low as 83 this month. She denies bleeding, but does note bruising occasionally. Today, CBC reveals platelet count of 123 with normal white count and hemoglobin. CMP reveals clinical dehydration which is most likely contributing to her dizziness. We discussed increasing her fluid intake to 8 cups/ day and discussed avoiding large intake of caffeine. We will review pending B12, folate and TSH from today. She is afebrile today. She notes her last fever of 100.2 was within the last week. I do not feel like she needs intervention at this time; however, we will continue to follow. We discussed signs and symptoms of low platelets including bleeding and bruising. Both she and her  husband understand that should she notice these symptoms to contact our office.    Plan: 1.  Return to clinic in 3 months for evaluation, sooner if needed.   I discussed the assessment and treatment plan with the patient.  The patient was provided an opportunity to ask questions and all were answered.  The patient agreed with the plan and demonstrated an understanding of the instructions.  The patient was advised to call back if the symptoms worsen or if the condition fails to improve as anticipated.  Thank you for the opportunity     Melodye Ped, NP

## 2021-05-17 ENCOUNTER — Telehealth: Payer: Self-pay | Admitting: Hematology and Oncology

## 2021-05-17 NOTE — Telephone Encounter (Signed)
Patient has been scheduled for a 6m follow-up per LOS. Left voicemail notifying patient of appt date and time; requested she call us back if this appt date and time did not work for her.

## 2021-06-20 ENCOUNTER — Other Ambulatory Visit: Payer: Self-pay | Admitting: Internal Medicine

## 2021-06-20 DIAGNOSIS — E1140 Type 2 diabetes mellitus with diabetic neuropathy, unspecified: Secondary | ICD-10-CM

## 2021-06-24 ENCOUNTER — Other Ambulatory Visit: Payer: Self-pay | Admitting: Family Medicine

## 2021-07-05 ENCOUNTER — Other Ambulatory Visit: Payer: Self-pay | Admitting: Family Medicine

## 2021-08-11 ENCOUNTER — Encounter (INDEPENDENT_AMBULATORY_CARE_PROVIDER_SITE_OTHER): Payer: Self-pay | Admitting: Ophthalmology

## 2021-08-11 ENCOUNTER — Ambulatory Visit (INDEPENDENT_AMBULATORY_CARE_PROVIDER_SITE_OTHER): Payer: Medicare Other | Admitting: Ophthalmology

## 2021-08-11 ENCOUNTER — Encounter (INDEPENDENT_AMBULATORY_CARE_PROVIDER_SITE_OTHER): Payer: Medicare Other | Admitting: Ophthalmology

## 2021-08-11 DIAGNOSIS — H538 Other visual disturbances: Secondary | ICD-10-CM

## 2021-08-11 DIAGNOSIS — H34832 Tributary (branch) retinal vein occlusion, left eye, with macular edema: Secondary | ICD-10-CM | POA: Diagnosis not present

## 2021-08-11 HISTORY — DX: Other visual disturbances: H53.8

## 2021-08-11 MED ORDER — BEVACIZUMAB 2.5 MG/0.1ML IZ SOSY
2.5000 mg | PREFILLED_SYRINGE | INTRAVITREAL | Status: AC | PRN
  Administered 2021-08-11: 2.5 mg via INTRAVITREAL

## 2021-08-11 NOTE — Assessment & Plan Note (Signed)
BRVO OS with CME, slightly recurrent still again at 15-week follow-up today, with this type of recurrence and persistence of poor vision, will need to repeat injection today and reevaluate in in 3 months.

## 2021-08-11 NOTE — Assessment & Plan Note (Signed)
Multifocal lens in place, media not opacified, no PC opacity.  Clinical evaluation of the macula by OCT and clinically is normal right eye

## 2021-08-11 NOTE — Progress Notes (Signed)
[facility]  [address] [place],  [place]  [address] [phone]  Clinic Day:  08/12/2021  Referring physician: Tower, Valerie Fanny, MD   HISTORY OF PRESENT ILLNESS:  Valerie Ball is a 76 y.o. female who our office recently began seeing for mildly progressive thrombocytopenia. She comes in today to reassess her thrombocytopenia.  Since her last visit, the patient has been doing well.  She denies having any spontaneous bleeding/bruising issues which concern her for severe thrombocytopenia being present.  VITALS:  Blood pressure 135/62, pulse 86, temperature 98.3 F (36.8 C), resp. rate 16, height '5\' 3"'$  (1.6 m), weight 152 lb 3.2 oz (69 kg), last menstrual period 03/20/1981, SpO2 95 %.  Wt Readings from Last 3 Encounters:  08/12/21 152 lb 3.2 oz (69 kg)  05/16/21 150 lb 14.4 oz (68.4 kg)  05/09/21 150 lb (68 kg)    Body mass index is 26.96 kg/m.  Performance status (ECOG): 1 - Symptomatic but completely ambulatory  PHYSICAL EXAM:  Physical Exam Constitutional:      General: She is not in acute distress.    Appearance: Normal appearance. She is normal weight. She is not ill-appearing, toxic-appearing or diaphoretic.  HENT:     Head: Normocephalic and atraumatic.     Right Ear: Tympanic membrane normal.     Left Ear: Tympanic membrane normal.     Nose: Nose normal. No congestion or rhinorrhea.     Mouth/Throat:     Mouth: Mucous membranes are moist.     Pharynx: Oropharynx is clear. No oropharyngeal exudate or posterior oropharyngeal erythema.  Eyes:     General: No scleral icterus.       Right eye: No discharge.        Left eye: No discharge.     Extraocular Movements: Extraocular movements intact.     Conjunctiva/sclera: Conjunctivae normal.     Pupils: Pupils are equal, round, and reactive to light.  Neck:     Vascular: No carotid bruit.  Cardiovascular:     Rate and Rhythm: Normal rate and regular rhythm.     Heart sounds: No  murmur heard.   No friction rub. No gallop.  Pulmonary:     Effort: Pulmonary effort is normal. No respiratory distress.     Breath sounds: Normal breath sounds. No stridor. No wheezing, rhonchi or rales.  Chest:     Chest wall: No tenderness.  Abdominal:     General: Abdomen is flat. Bowel sounds are normal. There is no distension.     Palpations: There is no mass.     Tenderness: There is no abdominal tenderness. There is no right CVA tenderness, left CVA tenderness, guarding or rebound.     Hernia: No hernia is present.  Musculoskeletal:        General: No swelling, tenderness, deformity or signs of injury. Normal range of motion.     Cervical back: Normal range of motion and neck supple. No rigidity or tenderness.     Right lower leg: No edema.     Left lower leg: No edema.  Lymphadenopathy:     Cervical: No cervical adenopathy.  Skin:    General: Skin is warm and dry.     Capillary Refill: Capillary refill takes less than 2 seconds.     Coloration: Skin is not jaundiced or pale.     Findings: No bruising, erythema, lesion or rash.  Neurological:     General: No focal deficit present.  Mental Status: She is alert and oriented to person, place, and time. Mental status is at baseline.     Cranial Nerves: No cranial nerve deficit.     Sensory: No sensory deficit.     Motor: No weakness.     Coordination: Coordination normal.     Gait: Gait normal.     Deep Tendon Reflexes: Reflexes normal.  Psychiatric:        Mood and Affect: Mood normal.        Behavior: Behavior normal.        Thought Content: Thought content normal.        Judgment: Judgment normal.     LABS:      Latest Ref Rng & Units 08/12/2021   12:00 AM 05/16/2021   12:00 AM 05/04/2021    9:01 AM  CBC  WBC  4.7      6.0   3.4    Hemoglobin 12.0 - 16.0 12.7      13.9   12.2    Hematocrit 36 - 46 40      43   38.1    Platelets 150 - 400 K/uL 92      123   83.0       This result is from an external source.       Latest Ref Rng & Units 05/16/2021   12:00 AM 05/04/2021    9:01 AM 10/19/2020    2:40 PM  CMP  Glucose 70 - 99 mg/dL  118   148    BUN 4 - '21 22   28   26    '$ Creatinine 0.5 - 1.1 1.0   1.18   0.95    Sodium 137 - 147 141   142   139    Potassium 3.4 - 5.3 3.8   4.1   3.9    Chloride 99 - 108 106   103   100    CO2 13 - '22 25   26   29    '$ Calcium 8.7 - 10.7 8.8   9.2   9.4    Total Protein 6.0 - 8.3 g/dL  7.8   8.4    Total Bilirubin 0.2 - 1.2 mg/dL  0.3   0.9    Alkaline Phos 25 - 125 96   80   85    AST 13 - 35 51   34   34    ALT 7 - 35 33   20   22      Latest Reference Range & Units 05/16/21 15:22  Folate >5.9 ng/mL 13.6  Vitamin B12 180 - 914 pg/mL 245   ASSESSMENT & PLAN:  A 76 y.o. female with mild thrombocytopenia.  Her platelet count of 93 is slightly better than what it was at her last visit.  Overall, it is not much different than what it has been in the past.  When evaluating her recent labs, she does not have any nutritional deficiencies factoring into her thrombocytopenia.  Of note, she did have an elevated liver enzyme with recent labs.  This, in combination with her increased weight, has me concerned about her thrombocytopenia potentially being related to fatty liver disease.  Clinically, the patient is doing well.  I will see her back in 6 months for repeat clinical assessment.  Before that visit, a CT scan of her abdomen/pelvis will be done to ensure there is no underlying fatty liver disease/cirrhosis that could be factoring into  her thrombocytopenia.  The patient understands all the plans discussed today and is in agreement with them.  Ziere Docken Macarthur Critchley, MD

## 2021-08-11 NOTE — Progress Notes (Signed)
08/11/2021     CHIEF COMPLAINT Patient presents for  Chief Complaint  Patient presents with   Branch Retinal Vein Occlusion      HISTORY OF PRESENT ILLNESS: Valerie Ball is a 76 y.o. female who presents to the clinic today for:   HPI   15 weeks dilate OU, Avastin OCT OS. Patient reports "it seems to be getting worse to me. I can't see. The computer screen is hard for me to see. Normal activities like in the grocery store, I have to pick things up and get them close to my face before I can read them. Road signs when I am driving."  Last edited by Laurin Coder on 08/11/2021  8:53 AM.      Referring physician: Rutherford Guys, [place],  [place] [address]  HISTORICAL INFORMATION:   Selected notes from the MEDICAL RECORD NUMBER    Lab Results  Component Value Date   HGBA1C 6.1 (A) 03/03/2021     CURRENT MEDICATIONS: No current outpatient medications on file. (Ophthalmic Drugs)   No current facility-administered medications for this visit. (Ophthalmic Drugs)   Current Outpatient Medications (Other)  Medication Sig   Alpha-Lipoic Acid 600 MG TABS Take 600 mg by mouth 2 (two) times daily.   amLODipine (NORVASC) 5 MG tablet TAKE 1 TABLET DAILY   ezetimibe (ZETIA) 10 MG tablet TAKE 1 TABLET DAILY   fluticasone (FLONASE) 50 MCG/ACT nasal spray USE 2 SPRAYS IN EACH NOSTRIL DAILY AS NEEDED FOR ALLERGIES   hydrochlorothiazide (HYDRODIURIL) 25 MG tablet TAKE 1 TABLET DAILY   JARDIANCE 25 MG TABS tablet TAKE 1 TABLET DAILY BEFORE BREAKFAST   levothyroxine (SYNTHROID) 50 MCG tablet TAKE 1 TABLET DAILY BEFORE BREAKFAST   losartan (COZAAR) 100 MG tablet TAKE 1 TABLET DAILY   Multiple Vitamin (MULTIVITAMIN WITH MINERALS) TABS tablet Take 1 tablet by mouth daily.   naproxen sodium (ALEVE) 220 MG tablet Take 220 mg by mouth daily as needed (pain).   omeprazole (PRILOSEC) 20 MG capsule TAKE 1 CAPSULE DAILY   repaglinide (PRANDIN) 1 MG tablet Take 1 tablet (1  mg total) by mouth daily before supper.   rOPINIRole (REQUIP) 1 MG tablet TAKE 1 TABLET AT BEDTIME   rosuvastatin (CRESTOR) 10 MG tablet TAKE 1 TABLET TWICE A WEEK   sertraline (ZOLOFT) 100 MG tablet TAKE 1 TABLET AT BEDTIME   TRULICITY 1.5 OY/7.7AJ SOPN INJECT 1.5 MG UNDER THE SKIN ONCE A WEEK   TRULICITY 3 OI/7.8MV SOPN INJECT 0.5 ML (3 MG) UNDER THE SKIN ONCE A WEEK   No current facility-administered medications for this visit. (Other)      REVIEW OF SYSTEMS: ROS   Negative for: Constitutional, Gastrointestinal, Neurological, Skin, Genitourinary, Musculoskeletal, HENT, Endocrine, Cardiovascular, Eyes, Respiratory, Psychiatric, Allergic/Imm, Heme/Lymph Last edited by Hurman Horn, MD on 08/11/2021  9:19 AM.       ALLERGIES Allergies  Allergen Reactions   Arimidex [Anastrozole] Other (See Comments)    Joint pain   Glipizide Other (See Comments)    Hypoglycemia    Metformin And Related Other (See Comments)    diarrhea   Penicillins Hives and Rash    PAST MEDICAL HISTORY Past Medical History:  Diagnosis Date   Allergy    allegic rhinitis   Anemia    Angiodysplasia of colon    Anxiety    Arthritis    AVM (arteriovenous malformation) of colon    Blood transfusion    Borderline diabetic  per patient medical history form   Breast cancer ([place]) 02/2009   breast CA L invasive ductal CA(hormone receptor positive.)   Cataract    Colon polyp    hyperplastic   Colon stricture ([place]) 01/14/2003   Diabetes mellitus    pt's medical doctor took her off diabetic meds-blood sugars have not been a problem--and medications were causing pt's sugars to be too low   Diverticulosis    Esophageal stricture 02/04/2008   Fatty liver    GERD (gastroesophageal reflux disease)    GI bleed    Gout    Hepatic steatosis    Hiatal hernia    Hyperlipidemia    Hypertension    Hypothyroidism    IBS (irritable bowel syndrome)    Macular edema    left eye   Melena 01/2008   anemia  transfusion   Menopause    per patient medical history form   Osteoporosis    Postmenopausal hormone therapy    per patient medical history form.   RLS (restless legs syndrome)    Salmonella 05/2000   renal effects secondary to dehydration   Seizure disorder ([place])    Seizures ([place])    one seizure several yrs ago --etiology unknown-no problem since   Vitamin B 12 deficiency    Past Surgical History:  Procedure Laterality Date   ABDOMINAL HYSTERECTOMY  1983   BREAST SURGERY  02/2009   breast biopsy invasive ductal CA-bilateral mastectomies   EPIGASTRIC HERNIA REPAIR  01/02/2012   Procedure: HERNIA REPAIR EPIGASTRIC ADULT;  Surgeon: Pedro Earls, MD;  Location: [facility];  Service: General;  Laterality: N/A;  Laparoscopic Repair Paraesophageal Hernia, [place]   EYE SURGERY Left 07/2017   Dr. Donny Pique CYST EXCISION     LAPAROSCOPIC NISSEN FUNDOPLICATION  62/70/3500   Procedure: LAPAROSCOPIC NISSEN FUNDOPLICATION;  Surgeon: Pedro Earls, MD;  Location: [facility];  Service: General;  Laterality: N/A;   MASTECTOMY  04/2009   Bilateral for ductal carcinoma on L   OVARY SURGERY  1986   ovarian tumor removed   TUBAL LIGATION      FAMILY HISTORY Family History  Problem Relation Age of Onset   Hypertension Mother    Stroke Mother    Breast cancer Mother    Heart attack Father    Diabetes Father    Esophageal cancer Father    Breast cancer Sister        x 2 sisters   Diabetes Sister    Hypertension Sister    Breast cancer Sister    Hypertension Brother    Diabetes Brother     SOCIAL HISTORY Social History   Tobacco Use   Smoking status: Never   Smokeless tobacco: Never  Vaping Use   Vaping Use: Never used  Substance Use Topics   Alcohol use: Yes    Comment: occasional   Drug use: Never         OPHTHALMIC EXAM:  Base Eye Exam     Visual Acuity (ETDRS)       Right Left   Dist [place] 20/30 +2 20/125 -1   Dist ph [place] 20/25 -2 20/100 -2          Tonometry (Tonopen, 8:56 AM)       Right Left   Pressure 9 9         Pupils       Pupils Dark Light React APD   Right PERRL 3 2 Brisk None  Left PERRL 3 2 8:56 AM None         Extraocular Movement       Right Left    Full Full         Neuro/Psych     Oriented x3: Yes   Mood/Affect: Normal         Dilation     Both eyes: 1.0% Mydriacyl, 2.5% Phenylephrine @ 8:56 AM           Slit Lamp and Fundus Exam     External Exam       Right Left   External Normal Normal         Slit Lamp Exam       Right Left   Lids/Lashes Normal Normal   Conjunctiva/Sclera White and quiet White and quiet   Cornea Clear Clear   Anterior Chamber Deep and quiet Deep and quiet   Iris Round and reactive Round and reactive   Lens Posterior chamber intraocular lens Posterior chamber intraocular lens   Anterior Vitreous Normal Normal         Fundus Exam       Right Left   Posterior Vitreous Posterior vitreous detachment Central vitreous floaters   Disc Normal Normal, collaterals on nerve   C/D Ratio 0.4 0.50   Macula Normal Good Focal laser, Microaneurysms, no macular thickening, no cystoid macular edema   Vessels Normal, , no DR Macular branch retinal vein occlusion superotemporal.  No CME recurrence centrally, walked superiorly, good focal superiorly   Periphery Normal Normal            IMAGING AND PROCEDURES  Imaging and Procedures for 08/11/21  Intravitreal Injection, Pharmacologic Agent - OS - Left Eye       Time Out 08/11/2021. 9:23 AM. Confirmed correct patient, procedure, site, and patient consented.   Anesthesia Topical anesthesia was used. Anesthetic medications included Lidocaine 4%.   Procedure Preparation included Ofloxacin , 10% betadine to eyelids, 5% betadine to ocular surface, Tobramycin 0.3%. A 30 gauge needle was used.   Injection: 2.5 mg bevacizumab 2.5 MG/0.1ML   Route: Intravitreal, Site: Left Eye   NDC: [phone], Lot: [id number],  Expiration date: 10/16/2021   Post-op Post injection exam found visual acuity of at least counting fingers. The patient tolerated the procedure well. There were no complications. The patient received written and verbal post procedure care education. Post injection medications included ocuflox.      OCT, Retina - OU - Both Eyes       Right Eye Quality was good. Scan locations included subfoveal. Central Foveal Thickness: 255. Progression has been stable. Findings include abnormal foveal contour, no IRF, no SRF, retinal drusen .   Left Eye Quality was good. Scan locations included subfoveal. Central Foveal Thickness: 292. Progression has worsened. Findings include abnormal foveal contour, myopic contour, no SRF, no IRF.   Notes OD, retinal drusen but no active CNVM  OS with history of BRVO with CME yet stable at this time with new recurrence, small and superior, currently 22-monthfollow-up post antivegF, will need repeat injection OS today to control condition and shorten interval of follow-up next   OS will continue to monitor follow             ASSESSMENT/PLAN:  Branch retinal vein occlusion with macular edema of left eye BRVO OS with CME, slightly recurrent still again at 15-week follow-up today, with this type of recurrence and persistence of poor vision, will need to repeat injection today and  reevaluate in in 3 months.  Blurred vision, right eye Multifocal lens in place, media not opacified, no PC opacity.  Clinical evaluation of the macula by OCT and clinically is normal right eye     ICD-10-CM   1. Branch retinal vein occlusion with macular edema of left eye  H34.8320 Intravitreal Injection, Pharmacologic Agent - OS - Left Eye    OCT, Retina - OU - Both Eyes    bevacizumab (AVASTIN) SOSY 2.5 mg    2. Blurred vision, right eye  H53.8       1.  Blurred vision seeing computer, the left eye does not really contribute to that type of visual acuity given the underlying  damage from the BRVO and CME  Hence the blurred vision must reside mostly in the right eye.  She does report however that if she covers the left eye that the vision does improve.  Thus this blurred vision may in fact be confusion of the brain and the visual perception to the computer.  This is not a refractive issue but simply the left eye providing a visual picture not as clear as the right eye and the brain trying to make them to work together.  2.  OS however with recurrence of CME at 4 months follow-up.  Repeat injection today and will reevaluate in 3 months    3.  Ophthalmic Meds Ordered this visit:  Meds ordered this encounter  Medications   bevacizumab (AVASTIN) SOSY 2.5 mg       Return in about 3 months (around 11/11/2021) for dilate, OPTOS FFA L/R, COLOR FP, AVASTIN OCT, OS.  There are no Patient Instructions on file for this visit.   Explained the diagnoses, plan, and follow up with the patient and they expressed understanding.  Patient expressed understanding of the importance of proper follow up care.   Clent Demark Briyan Kleven M.D. Diseases & Surgery of the Retina and Vitreous [facility] [place] 08/11/21     Abbreviations: M myopia (nearsighted); A astigmatism; H hyperopia (farsighted); P presbyopia; Mrx spectacle prescription;  CTL contact lenses; OD right eye; OS left eye; OU both eyes  XT exotropia; ET esotropia; PEK punctate epithelial keratitis; PEE punctate epithelial erosions; DES dry eye syndrome; MGD meibomian gland dysfunction; ATs artificial tears; PFAT's preservative free artificial tears; [place] nuclear sclerotic cataract; PSC posterior subcapsular cataract; ERM epi-retinal membrane; PVD posterior vitreous detachment; RD retinal detachment; DM diabetes mellitus; DR diabetic retinopathy; NPDR non-proliferative diabetic retinopathy; PDR proliferative diabetic retinopathy; CSME clinically significant macular edema; DME diabetic macular edema; dbh dot blot  hemorrhages; CWS cotton wool spot; POAG primary open angle glaucoma; C/D cup-to-disc ratio; HVF humphrey visual field; GVF goldmann visual field; OCT optical coherence tomography; IOP intraocular pressure; BRVO Branch retinal vein occlusion; CRVO central retinal vein occlusion; CRAO central retinal artery occlusion; BRAO branch retinal artery occlusion; RT retinal tear; SB scleral buckle; PPV pars plana vitrectomy; VH Vitreous hemorrhage; PRP panretinal laser photocoagulation; IVK intravitreal kenalog; VMT vitreomacular traction; MH Macular hole;  NVD neovascularization of the disc; NVE neovascularization elsewhere; AREDS age related eye disease study; ARMD age related macular degeneration; POAG primary open angle glaucoma; EBMD epithelial/anterior basement membrane dystrophy; ACIOL anterior chamber intraocular lens; IOL intraocular lens; PCIOL posterior chamber intraocular lens; Phaco/IOL phacoemulsification with intraocular lens placement; [place] photorefractive keratectomy; LASIK laser assisted in situ keratomileusis; HTN hypertension; DM diabetes mellitus; COPD chronic obstructive pulmonary disease

## 2021-08-12 ENCOUNTER — Inpatient Hospital Stay: Payer: Medicare Other

## 2021-08-12 ENCOUNTER — Telehealth: Payer: Self-pay | Admitting: Oncology

## 2021-08-12 ENCOUNTER — Inpatient Hospital Stay: Payer: Medicare Other | Attending: Oncology | Admitting: Oncology

## 2021-08-12 VITALS — BP 135/62 | HR 86 | Temp 98.3°F | Resp 16 | Ht 63.0 in | Wt 152.2 lb

## 2021-08-12 DIAGNOSIS — D696 Thrombocytopenia, unspecified: Secondary | ICD-10-CM

## 2021-08-12 LAB — CBC: RBC: 4.8 (ref 3.87–5.11)

## 2021-08-12 LAB — CBC AND DIFFERENTIAL
HCT: 40 (ref 36–46)
Hemoglobin: 12.7 (ref 12.0–16.0)
Neutrophils Absolute: 3.24
Platelets: 92 10*3/uL — AB (ref 150–400)
WBC: 4.7

## 2021-08-12 NOTE — Telephone Encounter (Signed)
Per 08/12/21 los next appt scheduled and confirmed with patient 

## 2021-08-16 ENCOUNTER — Encounter (INDEPENDENT_AMBULATORY_CARE_PROVIDER_SITE_OTHER): Payer: Medicare Other | Admitting: Ophthalmology

## 2021-09-18 ENCOUNTER — Other Ambulatory Visit: Payer: Self-pay | Admitting: Internal Medicine

## 2021-09-22 ENCOUNTER — Other Ambulatory Visit: Payer: Self-pay | Admitting: Family Medicine

## 2021-09-23 ENCOUNTER — Encounter: Payer: Self-pay | Admitting: Internal Medicine

## 2021-09-23 ENCOUNTER — Telehealth: Payer: Self-pay

## 2021-09-23 ENCOUNTER — Ambulatory Visit (INDEPENDENT_AMBULATORY_CARE_PROVIDER_SITE_OTHER): Payer: Medicare Other | Admitting: Internal Medicine

## 2021-09-23 ENCOUNTER — Other Ambulatory Visit: Payer: Self-pay | Admitting: Family Medicine

## 2021-09-23 VITALS — BP 110/70 | HR 77 | Ht 63.0 in | Wt 152.0 lb

## 2021-09-23 DIAGNOSIS — E785 Hyperlipidemia, unspecified: Secondary | ICD-10-CM | POA: Diagnosis not present

## 2021-09-23 DIAGNOSIS — E1140 Type 2 diabetes mellitus with diabetic neuropathy, unspecified: Secondary | ICD-10-CM | POA: Diagnosis not present

## 2021-09-23 DIAGNOSIS — E1169 Type 2 diabetes mellitus with other specified complication: Secondary | ICD-10-CM

## 2021-09-23 DIAGNOSIS — E1165 Type 2 diabetes mellitus with hyperglycemia: Secondary | ICD-10-CM | POA: Diagnosis not present

## 2021-09-23 DIAGNOSIS — E1142 Type 2 diabetes mellitus with diabetic polyneuropathy: Secondary | ICD-10-CM | POA: Diagnosis not present

## 2021-09-23 LAB — POCT GLYCOSYLATED HEMOGLOBIN (HGB A1C): Hemoglobin A1C: 6.5 % — AB (ref 4.0–5.6)

## 2021-09-23 MED ORDER — REPAGLINIDE 1 MG PO TABS: 1.0000 mg | ORAL_TABLET | Freq: Every day | ORAL | 3 refills | Status: DC

## 2021-09-23 MED ORDER — EMPAGLIFLOZIN 25 MG PO TABS: 25.0000 mg | ORAL_TABLET | Freq: Every day | ORAL | 3 refills | Status: DC

## 2021-09-23 NOTE — Telephone Encounter (Signed)
Note not needed 

## 2021-09-23 NOTE — Patient Instructions (Signed)
Please continue: - Jardiance 25 mg before b'fast - Prandin 1 mg before a larger dinner - Trulicity 3 mg weekly  Please return in 6 months with your sugar log.

## 2021-09-23 NOTE — Telephone Encounter (Signed)
Last filled 07/06/21 Last ov 05/09/21
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
## 2021-09-30 NOTE — Telephone Encounter (Signed)
error 

## 2021-10-28 ENCOUNTER — Ambulatory Visit: Payer: Medicare Other | Admitting: Nurse Practitioner

## 2021-10-28 ENCOUNTER — Other Ambulatory Visit: Payer: Medicare Other

## 2021-11-14 ENCOUNTER — Encounter (INDEPENDENT_AMBULATORY_CARE_PROVIDER_SITE_OTHER): Payer: Self-pay | Admitting: Ophthalmology

## 2021-11-14 ENCOUNTER — Ambulatory Visit (INDEPENDENT_AMBULATORY_CARE_PROVIDER_SITE_OTHER): Payer: Medicare Other | Admitting: Ophthalmology

## 2021-11-14 DIAGNOSIS — H34832 Tributary (branch) retinal vein occlusion, left eye, with macular edema: Secondary | ICD-10-CM

## 2021-11-14 MED ORDER — FLUORESCEIN SODIUM 10 % IV SOLN
500.0000 mg | INTRAVENOUS | Status: AC | PRN
  Administered 2021-11-14: 500 mg via INTRAVENOUS

## 2021-11-14 MED ORDER — BEVACIZUMAB CHEMO INJECTION 1.25MG/0.05ML SYRINGE FOR KALEIDOSCOPE
1.2500 mg | INTRAVITREAL | Status: AC | PRN
  Administered 2021-11-14: 1.25 mg via INTRAVITREAL

## 2021-11-14 NOTE — Progress Notes (Signed)
11/14/2021     CHIEF COMPLAINT Patient presents for  Chief Complaint  Patient presents with   Branch Retinal Vein Occlusion      HISTORY OF PRESENT ILLNESS: Valerie Ball is a 76 y.o. female who presents to the clinic today for:   HPI   3 MOS for DILATE OPTOS FFA L/R, COLOR FP, OCT, AVASTIN OS, OCT. Pt stated vision has not changed since last visit.   Last edited by Silvestre Moment on 11/14/2021 11:06 AM.      Referring physician: Tower, Wynelle Fanny, MD [place],  [place] [address]  HISTORICAL INFORMATION:   Selected notes from the MEDICAL RECORD NUMBER    Lab Results  Component Value Date   HGBA1C 6.5 (A) 09/23/2021     CURRENT MEDICATIONS: No current outpatient medications on file. (Ophthalmic Drugs)   No current facility-administered medications for this visit. (Ophthalmic Drugs)   Current Outpatient Medications (Other)  Medication Sig   Alpha-Lipoic Acid 600 MG TABS Take 600 mg by mouth 2 (two) times daily.   amLODipine (NORVASC) 5 MG tablet TAKE 1 TABLET DAILY   empagliflozin (JARDIANCE) 25 MG TABS tablet Take 1 tablet (25 mg total) by mouth daily before breakfast.   ezetimibe (ZETIA) 10 MG tablet TAKE 1 TABLET DAILY   fluticasone (FLONASE) 50 MCG/ACT nasal spray USE 2 SPRAYS IN EACH NOSTRIL DAILY AS NEEDED FOR ALLERGIES   hydrochlorothiazide (HYDRODIURIL) 25 MG tablet TAKE 1 TABLET DAILY   levothyroxine (SYNTHROID) 50 MCG tablet TAKE 1 TABLET DAILY BEFORE BREAKFAST   losartan (COZAAR) 100 MG tablet TAKE 1 TABLET DAILY   Multiple Vitamin (MULTIVITAMIN WITH MINERALS) TABS tablet Take 1 tablet by mouth daily.   naproxen sodium (ALEVE) 220 MG tablet Take 220 mg by mouth daily as needed (pain).   omeprazole (PRILOSEC) 20 MG capsule TAKE 1 CAPSULE DAILY   repaglinide (PRANDIN) 1 MG tablet Take 1 tablet (1 mg total) by mouth daily before supper.   rOPINIRole (REQUIP) 1 MG tablet TAKE 1 TABLET AT BEDTIME   rosuvastatin (CRESTOR) 10 MG tablet TAKE 1  TABLET TWICE A WEEK   sertraline (ZOLOFT) 100 MG tablet TAKE 1 TABLET AT BEDTIME   TRULICITY 3 HY/8.6VH SOPN INJECT 0.5 ML (3 MG) UNDER THE SKIN ONCE A WEEK   No current facility-administered medications for this visit. (Other)      REVIEW OF SYSTEMS: ROS   Negative for: Constitutional, Gastrointestinal, Neurological, Skin, Genitourinary, Musculoskeletal, HENT, Endocrine, Cardiovascular, Eyes, Respiratory, Psychiatric, Allergic/Imm, Heme/Lymph Last edited by Silvestre Moment on 11/14/2021 11:06 AM.       ALLERGIES Allergies  Allergen Reactions   Arimidex [Anastrozole] Other (See Comments)    Joint pain   Glipizide Other (See Comments)    Hypoglycemia    Metformin And Related Other (See Comments)    diarrhea   Penicillins Hives and Rash    PAST MEDICAL HISTORY Past Medical History:  Diagnosis Date   Allergy    allegic rhinitis   Anemia    Angiodysplasia of colon    Anxiety    Arthritis    AVM (arteriovenous malformation) of colon    Blood transfusion    Borderline diabetic    per patient medical history form   Breast cancer ([place]) 02/2009   breast CA L invasive ductal CA(hormone receptor positive.)   Cataract    Colon polyp    hyperplastic   Colon stricture ([place]) 01/14/2003   Diabetes mellitus    pt's medical doctor  took her off diabetic meds-blood sugars have not been a problem--and medications were causing pt's sugars to be too low   Diverticulosis    Esophageal stricture 02/04/2008   Fatty liver    GERD (gastroesophageal reflux disease)    GI bleed    Gout    Hepatic steatosis    Hiatal hernia    Hyperlipidemia    Hypertension    Hypothyroidism    IBS (irritable bowel syndrome)    Macular edema    left eye   Melena 01/2008   anemia transfusion   Menopause    per patient medical history form   Osteoporosis    Postmenopausal hormone therapy    per patient medical history form.   RLS (restless legs syndrome)    Salmonella 05/2000   renal effects secondary  to dehydration   Seizure disorder ([place])    Seizures ([place])    one seizure several yrs ago --etiology unknown-no problem since   Vitamin B 12 deficiency    Past Surgical History:  Procedure Laterality Date   ABDOMINAL HYSTERECTOMY  1983   BREAST SURGERY  02/2009   breast biopsy invasive ductal CA-bilateral mastectomies   EPIGASTRIC HERNIA REPAIR  01/02/2012   Procedure: HERNIA REPAIR EPIGASTRIC ADULT;  Surgeon: Pedro Earls, MD;  Location: [facility];  Service: General;  Laterality: N/A;  Laparoscopic Repair Paraesophageal Hernia, [place]   EYE SURGERY Left 07/2017   Dr. Donny Pique CYST EXCISION     LAPAROSCOPIC NISSEN FUNDOPLICATION  43/32/9518   Procedure: LAPAROSCOPIC NISSEN FUNDOPLICATION;  Surgeon: Pedro Earls, MD;  Location: [facility];  Service: General;  Laterality: N/A;   MASTECTOMY  04/2009   Bilateral for ductal carcinoma on L   OVARY SURGERY  1986   ovarian tumor removed   TUBAL LIGATION      FAMILY HISTORY Family History  Problem Relation Age of Onset   Hypertension Mother    Stroke Mother    Breast cancer Mother    Heart attack Father    Diabetes Father    Esophageal cancer Father    Breast cancer Sister        x 2 sisters   Diabetes Sister    Hypertension Sister    Breast cancer Sister    Hypertension Brother    Diabetes Brother     SOCIAL HISTORY Social History   Tobacco Use   Smoking status: Never   Smokeless tobacco: Never  Vaping Use   Vaping Use: Never used  Substance Use Topics   Alcohol use: Yes    Comment: occasional   Drug use: Never         OPHTHALMIC EXAM:  Base Eye Exam     Visual Acuity (ETDRS)       Right Left   Dist [place] 20/30 20/100 -1   Dist ph [place] 20/25 -2 20/70 -2         Tonometry (Tonopen, 11:11 AM)       Right Left   Pressure 14 14         Pupils       Pupils APD   Right PERRL None   Left PERRL None         Visual Fields       Left Right    Full Full         Extraocular Movement        Right Left    Full, Ortho Full, Ortho  Neuro/Psych     Oriented x3: Yes   Mood/Affect: Normal         Dilation     Both eyes: 1.0% Mydriacyl, 2.5% Phenylephrine @ 11:11 AM           Slit Lamp and Fundus Exam     External Exam       Right Left   External Normal Normal         Slit Lamp Exam       Right Left   Lids/Lashes Normal Normal   Conjunctiva/Sclera White and quiet White and quiet   Cornea Clear Clear   Anterior Chamber Deep and quiet Deep and quiet   Iris Round and reactive Round and reactive   Lens Posterior chamber intraocular lens Posterior chamber intraocular lens   Anterior Vitreous Normal Normal         Fundus Exam       Right Left   Posterior Vitreous Posterior vitreous detachment Central vitreous floaters   Disc Normal Normal, collaterals on nerve   C/D Ratio 0.4 0.50   Macula Normal Good Focal laser, Microaneurysms, no macular thickening, no cystoid macular edema   Vessels Normal, , no DR Macular branch retinal vein occlusion superotemporal.  No CME recurrence centrally, walked superiorly, good focal superiorly   Periphery Normal Normal            IMAGING AND PROCEDURES  Imaging and Procedures for 11/14/21  OCT, Retina - OU - Both Eyes       Right Eye Quality was good. Scan locations included subfoveal. Central Foveal Thickness: 253. Progression has been stable. Findings include no IRF, no SRF, abnormal foveal contour, retinal drusen .   Left Eye Quality was good. Scan locations included subfoveal. Central Foveal Thickness: 313. Progression has worsened. Findings include no IRF, no SRF, abnormal foveal contour, myopic contour.   Notes OD, retinal drusen but no active CNVM  OS with history of BRVO with CME yet stable at this time with stable CME small and superior, currently 63-monthfollow-up post antivegF, will need repeat injection OS today to control condition and shorten interval of follow-up next   OS  will continue to monitor follow      Color Fundus Photography Optos - OU - Both Eyes       Right Eye Progression has been stable. Disc findings include normal observations. Macula : normal observations. Vessels : normal observations. Periphery : normal observations.   Left Eye Disc findings include normal observations. Periphery : normal observations.   Notes Old BRVO superiorly left eye, stable now CME resolved .  With central vitreous floaters  OD with incidental cilioretinal artery From the nerve        Fluorescein Angiography Optos (Transit OS)       Injection: 500 mg Fluorescein Sodium 10 %   Route: Intravenous, Site: Right Arm   NDC: 0970-009-1157  Right Eye   Progression has no prior data.   Left Eye   Progression has no prior data. Choroidal neovascularization is not present.   Notes Poor dispersion thru IV,, limited views      Intravitreal Injection, Pharmacologic Agent - OS - Left Eye       Time Out 11/14/2021. 11:51 AM. Confirmed correct patient, procedure, site, and patient consented.   Anesthesia Topical anesthesia was used. Anesthetic medications included Lidocaine 4%.   Procedure Preparation included 5% betadine to ocular surface, 10% betadine to eyelids, Tobramycin 0.3%, Ofloxacin . A 30 gauge needle was  used.   Injection: 1.25 mg Bevacizumab 1.'25mg'$ /0.110m   Route: Intravitreal, Site: Left Eye   NDC: [id number] Lot:: [id number] Expiration date: 02/01/2022   Post-op Post injection exam found visual acuity of at least counting fingers. The patient tolerated the procedure well. There were no complications. The patient received written and verbal post procedure care education. Post injection medications included ocuflox.              ASSESSMENT/PLAN:  No problem-specific Assessment & Plan notes found for this encounter.      ICD-10-CM   1. Branch retinal vein occlusion with macular edema of left eye  H34.8320 OCT, Retina - OU -  Both Eyes    Color Fundus Photography Optos - OU - Both Eyes    Fluorescein Angiography Optos (Transit OS)    Intravitreal Injection, Pharmacologic Agent - OS - Left Eye    Fluorescein Sodium 10 % injection 500 mg    Bevacizumab (AVASTIN) SOLN 1.25 mg      Chronic CME with BRVO OS, stable at this visit 326-monthnterval.  We will repeat injection today and reevaluate next in 3 months.  2.  Angiography today with poor dispersion through the intravascular volume, but no active leakage.   3.  OCTremained stable.  Ophthalmic Meds Ordered this visit:  Meds ordered this encounter  Medications   Fluorescein Sodium 10 % injection 500 mg   Bevacizumab (AVASTIN) SOLN 1.25 mg       Return in about 3 months (around 02/14/2022) for DILATE OU, AVASTIN OCT, OS.  There are no Patient Instructions on file for this visit.   Explained the diagnoses, plan, and follow up with the patient and they expressed understanding.  Patient expressed understanding of the importance of proper follow up care.   GaClent Demarkankin M.D. Diseases & Surgery of the Retina and Vitreous Retina & Diabetic EySheatown8/28/23     Abbreviations: M myopia (nearsighted); A astigmatism; H hyperopia (farsighted); P presbyopia; Mrx spectacle prescription;  CTL contact lenses; OD right eye; OS left eye; OU both eyes  XT exotropia; ET esotropia; PEK punctate epithelial keratitis; PEE punctate epithelial erosions; DES dry eye syndrome; MGD meibomian gland dysfunction; ATs artificial tears; PFAT's preservative free artificial tears; NSAndersonuclear sclerotic cataract; PSC posterior subcapsular cataract; ERM epi-retinal membrane; PVD posterior vitreous detachment; RD retinal detachment; DM diabetes mellitus; DR diabetic retinopathy; NPDR non-proliferative diabetic retinopathy; PDR proliferative diabetic retinopathy; CSME clinically significant macular edema; DME diabetic macular edema; dbh dot blot hemorrhages; CWS cotton wool spot; POAG  primary open angle glaucoma; C/D cup-to-disc ratio; HVF humphrey visual field; GVF goldmann visual field; OCT optical coherence tomography; IOP intraocular pressure; BRVO Branch retinal vein occlusion; CRVO central retinal vein occlusion; CRAO central retinal artery occlusion; BRAO branch retinal artery occlusion; RT retinal tear; SB scleral buckle; PPV pars plana vitrectomy; VH Vitreous hemorrhage; PRP panretinal laser photocoagulation; IVK intravitreal kenalog; VMT vitreomacular traction; MH Macular hole;  NVD neovascularization of the disc; NVE neovascularization elsewhere; AREDS age related eye disease study; ARMD age related macular degeneration; POAG primary open angle glaucoma; EBMD epithelial/anterior basement membrane dystrophy; ACIOL anterior chamber intraocular lens; IOL intraocular lens; PCIOL posterior chamber intraocular lens; Phaco/IOL phacoemulsification with intraocular lens placement; PRGarcenohotorefractive keratectomy; LASIK laser assisted in situ keratomileusis; HTN hypertension; DM diabetes mellitus; COPD chronic obstructive pulmonary disease

## 2021-12-28 ENCOUNTER — Encounter: Payer: Self-pay | Admitting: Internal Medicine

## 2021-12-28 DIAGNOSIS — E1165 Type 2 diabetes mellitus with hyperglycemia: Secondary | ICD-10-CM

## 2021-12-28 MED ORDER — TRULICITY 3 MG/0.5ML ~~LOC~~ SOAJ: SUBCUTANEOUS | 3 refills | Status: DC

## 2022-02-14 ENCOUNTER — Encounter (INDEPENDENT_AMBULATORY_CARE_PROVIDER_SITE_OTHER): Payer: Self-pay

## 2022-02-14 ENCOUNTER — Encounter (INDEPENDENT_AMBULATORY_CARE_PROVIDER_SITE_OTHER): Payer: Medicare Other | Admitting: Ophthalmology

## 2022-02-14 DIAGNOSIS — H34832 Tributary (branch) retinal vein occlusion, left eye, with macular edema: Secondary | ICD-10-CM | POA: Diagnosis not present

## 2022-02-14 DIAGNOSIS — H35352 Cystoid macular degeneration, left eye: Secondary | ICD-10-CM | POA: Diagnosis not present

## 2022-02-14 DIAGNOSIS — H43392 Other vitreous opacities, left eye: Secondary | ICD-10-CM | POA: Diagnosis not present

## 2022-02-14 DIAGNOSIS — H35032 Hypertensive retinopathy, left eye: Secondary | ICD-10-CM | POA: Diagnosis not present

## 2022-02-14 DIAGNOSIS — H43811 Vitreous degeneration, right eye: Secondary | ICD-10-CM | POA: Diagnosis not present

## 2022-03-10 ENCOUNTER — Ambulatory Visit (INDEPENDENT_AMBULATORY_CARE_PROVIDER_SITE_OTHER): Payer: Medicare Other

## 2022-03-10 VITALS — Ht 63.0 in | Wt 152.0 lb

## 2022-03-10 DIAGNOSIS — Z0000 Encounter for general adult medical examination without abnormal findings: Secondary | ICD-10-CM

## 2022-03-10 NOTE — Patient Instructions (Addendum)
[name] , Thank you for taking time to come for your Medicare Wellness Visit. I appreciate your ongoing commitment to your health goals. Please review the following plan we discussed and let me know if I can assist you in the future.   These are the goals we discussed:  Goals Addressed             This Visit's Progress    Increase physical activity           This is a list of the screening recommended for you and due dates:  Health Maintenance  Topic Date Due   Yearly kidney health urinalysis for diabetes  07/24/2010   Mammogram  03/24/2011   DTaP/Tdap/Td vaccine (2 - Td or Tdap) 07/18/2021   COVID-19 Vaccine (4 - 2023-24 season) 03/26/2022*   Flu Shot  06/18/2022*   Hemoglobin A1C  03/26/2022   Complete foot exam   05/09/2022   Yearly kidney function blood test for diabetes  05/16/2022   Eye exam for diabetics  02/15/2023   Medicare Annual Wellness Visit  03/11/2023   Colon Cancer Screening  03/24/2024   Pneumonia Vaccine  Completed   DEXA scan (bone density measurement)  Completed   Hepatitis C Screening: USPSTF Recommendation to screen - Ages 101-79 yo.  Completed   Zoster (Shingles) Vaccine  Completed   HPV Vaccine  Aged Out  *Topic was postponed. The date shown is not the original due date.   Conditions/risks identified: none new  Next appointment: Follow up in one year for your annual wellness visit    Preventive Care 65 Years and Older, Female Preventive care refers to lifestyle choices and visits with your health care provider that can promote health and wellness. What does preventive care include? A yearly physical exam. This is also called an annual well check. Dental exams once or twice a year. Routine eye exams. Ask your health care provider how often you should have your eyes checked. Personal lifestyle choices, including: Daily care of your teeth and gums. Regular physical activity. Eating a healthy diet. Avoiding tobacco and drug use. Limiting  alcohol use. Practicing safe sex. Taking low-dose aspirin every day. Taking vitamin and mineral supplements as recommended by your health care provider. What happens during an annual well check? The services and screenings done by your health care provider during your annual well check will depend on your age, overall health, lifestyle risk factors, and family history of disease. Counseling  Your health care provider may ask you questions about your: Alcohol use. Tobacco use. Drug use. Emotional well-being. Home and relationship well-being. Sexual activity. Eating habits. History of falls. Memory and ability to understand (cognition). Work and work Statistician. Reproductive health. Screening  You may have the following tests or measurements: Height, weight, and BMI. Blood pressure. Lipid and cholesterol levels. These may be checked every 5 years, or more frequently if you are over 21 years old. Skin check. Lung cancer screening. You may have this screening every year starting at age 86 if you have a 30-pack-year history of smoking and currently smoke or have quit within the past 15 years. Fecal occult blood test (FOBT) of the stool. You may have this test every year starting at age 75. Flexible sigmoidoscopy or colonoscopy. You may have a sigmoidoscopy every 5 years or a colonoscopy every 10 years starting at age 108. Hepatitis C blood test. Hepatitis B blood test. Sexually transmitted disease (STD) testing. Diabetes screening. This is done by checking your blood  sugar (glucose) after you have not eaten for a while (fasting). You may have this done every 1-3 years. Bone density scan. This is done to screen for osteoporosis. You may have this done starting at age 3. Mammogram. This may be done every 1-2 years. Talk to your health care provider about how often you should have regular mammograms. Talk with your health care provider about your test results, treatment options, and if  necessary, the need for more tests. Vaccines  Your health care provider may recommend certain vaccines, such as: Influenza vaccine. This is recommended every year. Tetanus, diphtheria, and acellular pertussis (Tdap, Td) vaccine. You may need a Td booster every 10 years. Zoster vaccine. You may need this after age 58. Pneumococcal 13-valent conjugate (PCV13) vaccine. One dose is recommended after age 56. Pneumococcal polysaccharide (PPSV23) vaccine. One dose is recommended after age 26. Talk to your health care provider about which screenings and vaccines you need and how often you need them. This information is not intended to replace advice given to you by your health care provider. Make sure you discuss any questions you have with your health care provider. Document Released: 04/02/2015 Document Revised: 11/24/2015 Document Reviewed: 01/05/2015 Elsevier Interactive Patient Education  2017 [place] Prevention in the Home Falls can cause injuries. They can happen to people of all ages. There are many things you can do to make your home safe and to help prevent falls. What can I do on the outside of my home? Regularly fix the edges of walkways and driveways and fix any cracks. Remove anything that might make you trip as you walk through a door, such as a raised step or threshold. Trim any bushes or trees on the path to your home. Use bright outdoor lighting. Clear any walking paths of anything that might make someone trip, such as rocks or tools. Regularly check to see if handrails are loose or broken. Make sure that both sides of any steps have handrails. Any raised decks and porches should have guardrails on the edges. Have any leaves, snow, or ice cleared regularly. Use sand or salt on walking paths during winter. Clean up any spills in your garage right away. This includes oil or grease spills. What can I do in the bathroom? Use night lights. Install grab bars by the toilet  and in the tub and shower. Do not use towel bars as grab bars. Use non-skid mats or decals in the tub or shower. If you need to sit down in the shower, use a plastic, non-slip stool. Keep the floor dry. Clean up any water that spills on the floor as soon as it happens. Remove soap buildup in the tub or shower regularly. Attach bath mats securely with double-sided non-slip rug tape. Do not have throw rugs and other things on the floor that can make you trip. What can I do in the bedroom? Use night lights. Make sure that you have a light by your bed that is easy to reach. Do not use any sheets or blankets that are too big for your bed. They should not hang down onto the floor. Have a firm chair that has side arms. You can use this for support while you get dressed. Do not have throw rugs and other things on the floor that can make you trip. What can I do in the kitchen? Clean up any spills right away. Avoid walking on wet floors. Keep items that you use a lot in easy-to-reach  places. If you need to reach something above you, use a strong step stool that has a grab bar. Keep electrical cords out of the way. Do not use floor polish or wax that makes floors slippery. If you must use wax, use non-skid floor wax. Do not have throw rugs and other things on the floor that can make you trip. What can I do with my stairs? Do not leave any items on the stairs. Make sure that there are handrails on both sides of the stairs and use them. Fix handrails that are broken or loose. Make sure that handrails are as long as the stairways. Check any carpeting to make sure that it is firmly attached to the stairs. Fix any carpet that is loose or worn. Avoid having throw rugs at the top or bottom of the stairs. If you do have throw rugs, attach them to the floor with carpet tape. Make sure that you have a light switch at the top of the stairs and the bottom of the stairs. If you do not have them, ask someone to add  them for you. What else can I do to help prevent falls? Wear shoes that: Do not have high heels. Have rubber bottoms. Are comfortable and fit you well. Are closed at the toe. Do not wear sandals. If you use a stepladder: Make sure that it is fully opened. Do not climb a closed stepladder. Make sure that both sides of the stepladder are locked into place. Ask someone to hold it for you, if possible. Clearly mark and make sure that you can see: Any grab bars or handrails. First and last steps. Where the edge of each step is. Use tools that help you move around (mobility aids) if they are needed. These include: Canes. Walkers. Scooters. Crutches. Turn on the lights when you go into a dark area. Replace any light bulbs as soon as they burn out. Set up your furniture so you have a clear path. Avoid moving your furniture around. If any of your floors are uneven, fix them. If there are any pets around you, be aware of where they are. Review your medicines with your doctor. Some medicines can make you feel dizzy. This can increase your chance of falling. Ask your doctor what other things that you can do to help prevent falls. This information is not intended to replace advice given to you by your health care provider. Make sure you discuss any questions you have with your health care provider. Document Released: 12/31/2008 Document Revised: 08/12/2015 Document Reviewed: 04/10/2014 Elsevier Interactive Patient Education  2017 [employer].

## 2022-03-10 NOTE — Progress Notes (Signed)
Subjective:   Valerie Ball is a 76 y.o. female who presents for Medicare Annual (Subsequent) preventive examination.  Review of Systems    No ROS.  Medicare Wellness Virtual Visit.  Visual/audio telehealth visit, UTA vital signs.   See social history for additional risk factors.   Cardiac Risk Factors include: advanced age (>12mn, >>61women);hypertension;diabetes mellitus     Objective:    Today's Vitals   03/10/22 0920  Weight: 152 lb (68.9 kg)  Height: _0  (1.6 m)   Body mass index is 26.93 kg/m.     03/10/2022    9:44 AM 08/12/2021   10:41 AM 05/16/2021    2:56 PM 03/08/2021    1:24 PM 10/28/2020    9:41 AM 03/05/2020    1:18 PM 09/06/2018   11:00 AM  Advanced Directives  Does Patient Have a Medical Advance Directive? _1  No No  Would patient like information on creating a medical advance directive? No - Patient declined   Yes (MAU/Ambulatory/Procedural Areas - Information given) No - Patient declined Yes (MAU/Ambulatory/Procedural Areas - Information given) No - Patient declined    Current Medications (verified) Outpatient Encounter Medications as of 03/10/2022  Medication Sig   Alpha-Lipoic Acid 600 MG TABS Take 600 mg by mouth 2 (two) times daily.   amLODipine (NORVASC) 5 MG tablet TAKE 1 TABLET DAILY   Dulaglutide (TRULICITY) 3 MXB/3.5HGSOPN INJECT 0.5 ML (3 MG) UNDER THE SKIN ONCE A WEEK   empagliflozin (JARDIANCE) 25 MG TABS tablet Take 1 tablet (25 mg total) by mouth daily before breakfast.   ezetimibe (ZETIA) 10 MG tablet TAKE 1 TABLET DAILY   fluticasone (FLONASE) 50 MCG/ACT nasal spray USE 2 SPRAYS IN EACH NOSTRIL DAILY AS NEEDED FOR ALLERGIES   hydrochlorothiazide (HYDRODIURIL) 25 MG tablet TAKE 1 TABLET DAILY   levothyroxine (SYNTHROID) 50 MCG tablet TAKE 1 TABLET DAILY BEFORE BREAKFAST   losartan (COZAAR) 100 MG tablet TAKE 1 TABLET DAILY   Multiple Vitamin (MULTIVITAMIN WITH MINERALS) TABS tablet Take 1 tablet by mouth daily.    naproxen sodium (ALEVE) 220 MG tablet Take 220 mg by mouth daily as needed (pain).   omeprazole (PRILOSEC) 20 MG capsule TAKE 1 CAPSULE DAILY   repaglinide (PRANDIN) 1 MG tablet Take 1 tablet (1 mg total) by mouth daily before supper.   rOPINIRole (REQUIP) 1 MG tablet TAKE 1 TABLET AT BEDTIME   rosuvastatin (CRESTOR) 10 MG tablet TAKE 1 TABLET TWICE A WEEK   sertraline (ZOLOFT) 100 MG tablet TAKE 1 TABLET AT BEDTIME   [DISCONTINUED] cetirizine (ZYRTEC) 10 MG tablet Take 10 mg by mouth daily as needed. For allergy symptoms   No facility-administered encounter medications on file as of 03/10/2022.    Allergies (verified) Arimidex [anastrozole], Glipizide, Metformin and related, and Penicillins   History: Past Medical History:  Diagnosis Date   Allergy    allegic rhinitis   Anemia    Angiodysplasia of colon    Anxiety    Arthritis    AVM (arteriovenous malformation) of colon    Blood transfusion    Borderline diabetic    per patient medical history form   Breast cancer ([facility] 02/2009   breast CA L invasive ductal CA(hormone receptor positive.)   Cataract    Colon polyp    hyperplastic   [place] [place]   Diabetes mellitus    pt's medical doctor took her off diabetic meds-blood sugars have not been a problem--and medications were causing pt's  sugars to be too low   Diverticulosis    Esophageal stricture 02/04/2008   Fatty liver    GERD (gastroesophageal reflux disease)    GI bleed    Gout    Hepatic steatosis    Hiatal hernia    Hyperlipidemia    Hypertension    Hypothyroidism    IBS (irritable bowel syndrome)    Macular edema    left eye   Melena 01/2008   anemia transfusion   Menopause    per patient medical history form   Osteoporosis    Postmenopausal hormone therapy    per patient medical history form.   RLS (restless legs syndrome)    Salmonella 05/2000   renal effects secondary to dehydration   Seizure disorder ([place])    Seizures ([place])     one seizure several yrs ago --etiology unknown-no problem since   Vitamin B 12 deficiency    Past Surgical History:  Procedure Laterality Date   ABDOMINAL HYSTERECTOMY  1983   BREAST SURGERY  02/2009   breast biopsy invasive ductal CA-bilateral mastectomies   EPIGASTRIC HERNIA REPAIR  01/02/2012   Procedure: HERNIA REPAIR EPIGASTRIC ADULT;  Surgeon: Pedro Earls, MD;  Location: [facility];  Service: General;  Laterality: N/A;  Laparoscopic Repair Paraesophageal Hernia, [place]   EYE SURGERY Left 07/2017   Dr. Donny Pique CYST EXCISION     LAPAROSCOPIC NISSEN FUNDOPLICATION  [id number]   Procedure: LAPAROSCOPIC NISSEN FUNDOPLICATION;  Surgeon: Pedro Earls, MD;  Location: [facility];  Service: General;  Laterality: N/A;   MASTECTOMY  04/2009   Bilateral for ductal carcinoma on L   OVARY SURGERY  1986   ovarian tumor removed   TUBAL LIGATION     Family History  Problem Relation Age of Onset   Hypertension Mother    Stroke Mother    Breast cancer Mother    Heart attack Father    Diabetes Father    Esophageal cancer Father    Breast cancer Sister        x 2 sisters   Diabetes Sister    Hypertension Sister    Breast cancer Sister    Hypertension Brother    Diabetes Brother    Social History   Socioeconomic History   Marital status: Married    Spouse name: Not on file   Number of children: 3   Years of education: Not on file   Highest education level: Not on file  Occupational History   Occupation: retired    Fish farm manager: UNEMPLOYED  Tobacco Use   Smoking status: Never   Smokeless tobacco: Never  Vaping Use   Vaping Use: Never used  Substance and Sexual Activity   Alcohol use: Yes    Comment: occasional   Drug use: Never   Sexual activity: Not Currently  Other Topics Concern   Not on file  Social History Narrative   ** Merged History Encounter **       1 cup of coffee every morning   Social Determinants of Health   Financial Resource Strain: Medium Risk  (03/10/2022)   Overall Financial Resource Strain (CARDIA)    Difficulty of Paying Living Expenses: Somewhat hard  Food Insecurity: No Food Insecurity (03/10/2022)   Hunger Vital Sign    Worried About Running Out of Food in the Last Year: Never true    Ran Out of Food in the Last Year: Never true  Transportation Needs: No Transportation Needs (03/10/2022)   PRAPARE -  Hydrologist (Medical): No    Lack of Transportation (Non-Medical): No  Physical Activity: Inactive (03/10/2022)   Exercise Vital Sign    Days of Exercise per Week: 0 days    Minutes of Exercise per Session: 0 min  Stress: Stress Concern Present (03/10/2022)   [place]    Feeling of Stress : Rather much  Social Connections: Unknown (03/10/2022)   Social Connection and Isolation Panel [NHANES]    Frequency of Communication with Friends and Family: Not on file    Frequency of Social Gatherings with Friends and Family: Once a week    Attends Religious Services: Not on Advertising copywriter or Organizations: Yes    Attends Archivist Meetings: 1 to 4 times per year    Marital Status: Married    Tobacco Counseling Counseling given: Not Answered   Clinical Intake:  Pre-visit preparation completed: Yes        Diabetes: Yes (Followed by Endocrinology, Dr. Renne Crigler)  How often do you need to have someone help you when you read instructions, pamphlets, or other written materials from your doctor or pharmacy?: 1 - Never  Interpreter Needed?: No    Activities of Daily Living    03/10/2022    9:25 AM 03/09/2022    9:58 AM  In your present state of health, do you have any difficulty performing the following activities:  Hearing? 0 0  Vision? 1 1  Difficulty concentrating or making decisions? 1 1  Walking or climbing stairs? 1 1  Dressing or bathing? 0 0  Doing errands, shopping? 0 0  Preparing Food  and eating ? N N  Using the Toilet? N N  In the past six months, have you accidently leaked urine? Y Y  Do you have problems with loss of bowel control? N N  Managing your Medications? N N  Managing your Finances? N N  Housekeeping or managing your Housekeeping? Tempie Donning    Patient Care Team: Tower, Wynelle Fanny, MD as PCP - General (Family Medicine)  Indicate any recent Medical Services you may have received from other than [facility] providers in the past year (date may be approximate).     Assessment:   This is a routine wellness examination for Desarai.  I connected with  [name] on 03/10/22 by a audio enabled telemedicine application and verified that I am speaking with the correct person using two identifiers.  Patient Location: Home  Provider Location: Office/Clinic  I discussed the limitations of evaluation and management by telemedicine. The patient expressed understanding and agreed to proceed.   Hearing/Vision screen Hearing Screening - Comments:: Patient is able to hear conversational tones without difficulty.  No issues reported.   Vision Screening - Comments:: Wears glasses  Dietary issues and exercise activities discussed: Current Exercise Habits: Home exercise routine, Intensity: Mild Regular diet   Goals Addressed             This Visit's Progress    Increase physical activity         Depression Screen    03/10/2022    9:38 AM 03/08/2021    1:27 PM 03/05/2020    1:19 PM 09/06/2018   11:04 AM 08/31/2017   10:50 AM 08/22/2016    2:03 PM 01/26/2015   10:04 AM  PHQ 2/9 Scores  PHQ - 2 Score 0 1 0 0 4 0 0  PHQ-  9 Score   0 0 15      Fall Risk    03/10/2022    9:24 AM 03/09/2022    9:58 AM 03/08/2021    1:26 PM 03/05/2020    1:18 PM 09/06/2018   11:04 AM  [place] in the past year? 0 0 0 0 0  Number falls in past yr: 0  0 0   Injury with Fall? 0  0 0   Risk for fall due to :    Medication side effect   Follow up Falls evaluation  completed;Falls prevention discussed  Falls prevention discussed Falls evaluation completed;Falls prevention discussed     FALL RISK PREVENTION PERTAINING TO THE HOME: Home free of loose throw rugs in walkways, pet beds, electrical cords, etc? Yes  Adequate lighting in your home to reduce risk of falls? Yes   ASSISTIVE DEVICES UTILIZED TO PREVENT FALLS: Life alert? No  Use of a cane, walker or w/c? No  Grab bars in the bathroom? No  Shower chair or bench in shower? Yes  Elevated toilet seat or a handicapped toilet? No   TIMED UP AND GO: Was the test performed? No .   Cognitive Function:    03/05/2020    1:26 PM 09/06/2018   11:05 AM 08/31/2017   10:49 AM 08/22/2016    2:04 PM  MMSE - Mini Mental State Exam  Orientation to time _0 Orientation to Place _1 Registration _2 Attention/ Calculation 5 0 0 0  Recall _3 Language- name 2 objects  0 0 0  Language- repeat _4 Language- follow 3 step command  0 3 3  Language- read & follow direction  0 0 0  Write a sentence  0 0 0  Copy design  0 0 0  Total score  _5 03/10/2022    9:26 AM  6CIT Screen  What Year? 0 points  What month? 0 points  What time? 0 points  Count back from 20 0 points  Months in reverse 0 points  Repeat phrase 0 points  Total Score 0 points    Immunizations Immunization History  Administered Date(s) Administered   Fluad Quad(high Dose 65+) 11/19/2018   Influenza Split 01/17/2011   Influenza Whole 04/04/2005, 01/22/2009   Influenza, High Dose Seasonal PF 01/10/2018, 01/02/2020   Influenza,inj,Quad PF,6+ Mos 12/06/2012, 11/25/2013, 01/26/2015, 11/12/2015, 03/29/2017   Influenza-Unspecified 12/19/2019, 01/01/2021   PFIZER(Purple Top)SARS-COV-2 Vaccination 04/21/2019, 05/19/2019, 12/19/2019   Pneumococcal Conjugate-13 01/26/2015   Pneumococcal Polysaccharide-23 02/10/2008, 01/23/2014   Tdap 07/19/2011   Zoster Recombinat (Shingrix) 01/10/2018, 04/21/2018     TDAP status: Due, Education has been provided regarding the importance of this vaccine. Advised may receive this vaccine at local pharmacy or Health Dept. Aware to provide a copy of the vaccination record if obtained from local pharmacy or Health Dept. Verbalized acceptance and understanding.  Flu Vaccine status: Due, Education has been provided regarding the importance of this vaccine. Advised may receive this vaccine at local pharmacy or Health Dept. Aware to provide a copy of the vaccination record if obtained from local pharmacy or Health Dept. Verbalized acceptance and understanding.  Screening Tests Health Maintenance  Topic Date Due   Diabetic kidney evaluation - Urine ACR  07/24/2010   MAMMOGRAM  03/24/2011   DTaP/Tdap/Td (2 - Td or Tdap) 07/18/2021  COVID-19 Vaccine (4 - 2023-24 season) 03/26/2022 (Originally 11/18/2021)   INFLUENZA VACCINE  06/18/2022 (Originally 10/18/2021)   HEMOGLOBIN A1C  03/26/2022   FOOT EXAM  05/09/2022   Diabetic kidney evaluation - eGFR measurement  05/16/2022   OPHTHALMOLOGY EXAM  02/15/2023   Medicare Annual Wellness (AWV)  03/11/2023   COLONOSCOPY (Pts 45-14yr Insurance coverage will need to be confirmed)  03/24/2024   Pneumonia Vaccine 76 Years old  Completed   DEXA SCAN  Completed   Hepatitis C Screening  Completed   Zoster Vaccines- Shingrix  Completed   HPV VACCINES  Aged Out   Health Maintenance Health Maintenance Due  Topic Date Due   Diabetic kidney evaluation - Urine ACR  07/24/2010   MAMMOGRAM  03/24/2011   DTaP/Tdap/Td (2 - Td or Tdap) 07/18/2021   Mammogram- discontinue   Lung Cancer Screening: (Low Dose CT Chest recommended if Age 74-80years, 30 pack-year currently smoking OR have quit w/in 15years.) does not qualify.   Hepatitis C Screening: Completed 10/2020.  Vision Screening: Recommended annual ophthalmology exams for early detection of glaucoma and other disorders of the eye.  Dental Screening: Recommended annual  dental exams for proper oral hygiene  Community Resource Referral / Chronic Care Management: CRR required this visit?  No   CCM required this visit?  No      Plan:     I have personally reviewed and noted the following in the patient's chart:   Medical and social history Use of alcohol, tobacco or illicit drugs  Current medications and supplements including opioid prescriptions. Patient is not currently taking opioid prescriptions. Functional ability and status Nutritional status Physical activity Advanced directives List of other physicians Hospitalizations, surgeries, and ER visits in previous 12 months Vitals Screenings to include cognitive, depression, and falls Referrals and appointments  In addition, I have reviewed and discussed with patient certain preventive protocols, quality metrics, and best practice recommendations. A written personalized care plan for preventive services as well as general preventive health recommendations were provided to patient.     DLeta Jungling LPN   [id number]

## 2022-03-25 ENCOUNTER — Other Ambulatory Visit: Payer: Self-pay | Admitting: Family Medicine

## 2022-03-27 NOTE — Telephone Encounter (Signed)
Please schedule PE with labs prior after 2/20

## 2022-03-27 NOTE — Telephone Encounter (Signed)
Last lipid lab was 05/04/21, and CPE was 05/09/21, no future appts., please advise

## 2022-03-28 ENCOUNTER — Ambulatory Visit: Payer: Medicare Other | Admitting: Internal Medicine

## 2022-03-28 NOTE — Telephone Encounter (Signed)
Patient scheduled.

## 2022-04-03 ENCOUNTER — Other Ambulatory Visit: Payer: Self-pay | Admitting: Family Medicine

## 2022-04-03 NOTE — Telephone Encounter (Signed)
CPE on 05/11/22,  Requip filled last on 04/05/21 #90 tabs/ 3 refill, Zetia also due

## 2022-04-13 DIAGNOSIS — H35352 Cystoid macular degeneration, left eye: Secondary | ICD-10-CM | POA: Diagnosis not present

## 2022-04-13 DIAGNOSIS — H43392 Other vitreous opacities, left eye: Secondary | ICD-10-CM | POA: Diagnosis not present

## 2022-04-13 DIAGNOSIS — H34832 Tributary (branch) retinal vein occlusion, left eye, with macular edema: Secondary | ICD-10-CM | POA: Diagnosis not present

## 2022-04-13 DIAGNOSIS — H35032 Hypertensive retinopathy, left eye: Secondary | ICD-10-CM | POA: Diagnosis not present

## 2022-05-02 ENCOUNTER — Ambulatory Visit (INDEPENDENT_AMBULATORY_CARE_PROVIDER_SITE_OTHER): Payer: Medicare HMO | Admitting: Internal Medicine

## 2022-05-02 ENCOUNTER — Encounter: Payer: Self-pay | Admitting: Internal Medicine

## 2022-05-02 VITALS — BP 110/72 | HR 92 | Ht 63.0 in | Wt 148.4 lb

## 2022-05-02 DIAGNOSIS — E1140 Type 2 diabetes mellitus with diabetic neuropathy, unspecified: Secondary | ICD-10-CM | POA: Diagnosis not present

## 2022-05-02 DIAGNOSIS — E1142 Type 2 diabetes mellitus with diabetic polyneuropathy: Secondary | ICD-10-CM

## 2022-05-02 DIAGNOSIS — E785 Hyperlipidemia, unspecified: Secondary | ICD-10-CM | POA: Diagnosis not present

## 2022-05-02 DIAGNOSIS — E1169 Type 2 diabetes mellitus with other specified complication: Secondary | ICD-10-CM

## 2022-05-02 DIAGNOSIS — E1165 Type 2 diabetes mellitus with hyperglycemia: Secondary | ICD-10-CM | POA: Diagnosis not present

## 2022-05-02 LAB — POCT GLYCOSYLATED HEMOGLOBIN (HGB A1C): Hemoglobin A1C: 6.6 % — AB (ref 4.0–5.6)

## 2022-05-02 NOTE — Patient Instructions (Addendum)
Please continue: - Jardiance 25 mg before b'fast - Prandin 1 mg before a larger dinner - Trulicity 3 mg weekly  Check some more sugar at bedtime and in am. If the sugars after dinner are high, you may need to take Prandin more frequently.  Please return in 6 months with your sugar log.
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
## 2022-05-03 ENCOUNTER — Telehealth: Payer: Self-pay | Admitting: Family Medicine

## 2022-05-03 DIAGNOSIS — E039 Hypothyroidism, unspecified: Secondary | ICD-10-CM

## 2022-05-03 DIAGNOSIS — E538 Deficiency of other specified B group vitamins: Secondary | ICD-10-CM

## 2022-05-03 DIAGNOSIS — D696 Thrombocytopenia, unspecified: Secondary | ICD-10-CM

## 2022-05-03 DIAGNOSIS — I10 Essential (primary) hypertension: Secondary | ICD-10-CM

## 2022-05-03 DIAGNOSIS — E1169 Type 2 diabetes mellitus with other specified complication: Secondary | ICD-10-CM

## 2022-05-03 NOTE — Telephone Encounter (Signed)
-----   Message from Velna Hatchet, RT sent at 04/17/2022 10:32 AM EST ----- Regarding: Thu 2/15 lab Future lab orders needed for [facility] visit on 05/04/22, please.  Thanks, Anda Kraft

## 2022-05-04 ENCOUNTER — Other Ambulatory Visit (INDEPENDENT_AMBULATORY_CARE_PROVIDER_SITE_OTHER): Payer: Medicare HMO

## 2022-05-04 DIAGNOSIS — E785 Hyperlipidemia, unspecified: Secondary | ICD-10-CM

## 2022-05-04 DIAGNOSIS — E039 Hypothyroidism, unspecified: Secondary | ICD-10-CM | POA: Diagnosis not present

## 2022-05-04 DIAGNOSIS — I10 Essential (primary) hypertension: Secondary | ICD-10-CM

## 2022-05-04 DIAGNOSIS — E1169 Type 2 diabetes mellitus with other specified complication: Secondary | ICD-10-CM

## 2022-05-04 DIAGNOSIS — E538 Deficiency of other specified B group vitamins: Secondary | ICD-10-CM

## 2022-05-04 DIAGNOSIS — D696 Thrombocytopenia, unspecified: Secondary | ICD-10-CM

## 2022-05-04 LAB — COMPREHENSIVE METABOLIC PANEL
ALT: 28 U/L (ref 0–35)
AST: 43 U/L — ABNORMAL HIGH (ref 0–37)
Albumin: 4.4 g/dL (ref 3.5–5.2)
Alkaline Phosphatase: 84 U/L (ref 39–117)
BUN: 21 mg/dL (ref 6–23)
CO2: 28 mEq/L (ref 19–32)
Calcium: 9.6 mg/dL (ref 8.4–10.5)
Chloride: 102 mEq/L (ref 96–112)
Creatinine, Ser: 1.16 mg/dL (ref 0.40–1.20)
GFR: 46.38 mL/min — ABNORMAL LOW (ref >60.00)
Glucose, Bld: 136 mg/dL — ABNORMAL HIGH (ref 70–99)
Potassium: 3.7 mEq/L (ref 3.5–5.1)
Sodium: 143 mEq/L (ref 135–145)
Total Bilirubin: 0.6 mg/dL (ref 0.2–1.2)
Total Protein: 7.4 g/dL (ref 6.0–8.3)

## 2022-05-04 LAB — CBC WITH DIFFERENTIAL/PLATELET
Basophils Absolute: 0 10*3/uL (ref 0.0–0.1)
Basophils Relative: 0.7 % (ref 0.0–3.0)
Eosinophils Absolute: 0.1 10*3/uL (ref 0.0–0.7)
Eosinophils Relative: 1.4 % (ref 0.0–5.0)
HCT: 41.9 % (ref 36.0–46.0)
Hemoglobin: 13.8 g/dL (ref 12.0–15.0)
Lymphocytes Relative: 23.5 % (ref 12.0–46.0)
Lymphs Abs: 1 10*3/uL (ref 0.7–4.0)
MCHC: 32.9 g/dL (ref 30.0–36.0)
MCV: 83.8 fl (ref 78.0–100.0)
Monocytes Absolute: 0.3 10*3/uL (ref 0.1–1.0)
Monocytes Relative: 8.4 % (ref 3.0–12.0)
Neutro Abs: 2.7 10*3/uL (ref 1.4–7.7)
Neutrophils Relative %: 66 % (ref 43.0–77.0)
Platelets: 99 10*3/uL — ABNORMAL LOW (ref 150.0–400.0)
RBC: 5 Mil/uL (ref 3.87–5.11)
RDW: 16.3 % — ABNORMAL HIGH (ref 11.5–15.5)
WBC: 4.1 10*3/uL (ref 4.0–10.5)

## 2022-05-04 LAB — LIPID PANEL
Cholesterol: 112 mg/dL (ref 0–200)
HDL: 33.1 mg/dL — ABNORMAL LOW (ref >39.00)
NonHDL: 78.78
Total CHOL/HDL Ratio: 3
Triglycerides: 209 mg/dL — ABNORMAL HIGH (ref 0.0–149.0)
VLDL: 41.8 mg/dL — ABNORMAL HIGH (ref 0.0–40.0)

## 2022-05-04 LAB — LDL CHOLESTEROL, DIRECT: Direct LDL: 55 mg/dL

## 2022-05-04 LAB — VITAMIN B12: Vitamin B-12: 243 pg/mL (ref 211–911)

## 2022-05-04 LAB — TSH: TSH: 4.03 u[IU]/mL (ref 0.35–5.50)

## 2022-05-11 ENCOUNTER — Ambulatory Visit (INDEPENDENT_AMBULATORY_CARE_PROVIDER_SITE_OTHER): Payer: Medicare HMO | Admitting: Family Medicine

## 2022-05-11 ENCOUNTER — Encounter: Payer: Self-pay | Admitting: Family Medicine

## 2022-05-11 VITALS — BP 122/70 | HR 85 | Temp 97.4°F | Ht 62.75 in | Wt 150.1 lb

## 2022-05-11 DIAGNOSIS — I10 Essential (primary) hypertension: Secondary | ICD-10-CM | POA: Diagnosis not present

## 2022-05-11 DIAGNOSIS — K760 Fatty (change of) liver, not elsewhere classified: Secondary | ICD-10-CM | POA: Diagnosis not present

## 2022-05-11 DIAGNOSIS — E1142 Type 2 diabetes mellitus with diabetic polyneuropathy: Secondary | ICD-10-CM | POA: Diagnosis not present

## 2022-05-11 DIAGNOSIS — D696 Thrombocytopenia, unspecified: Secondary | ICD-10-CM | POA: Diagnosis not present

## 2022-05-11 DIAGNOSIS — M25562 Pain in left knee: Secondary | ICD-10-CM

## 2022-05-11 DIAGNOSIS — E2839 Other primary ovarian failure: Secondary | ICD-10-CM | POA: Diagnosis not present

## 2022-05-11 DIAGNOSIS — E1169 Type 2 diabetes mellitus with other specified complication: Secondary | ICD-10-CM

## 2022-05-11 DIAGNOSIS — E1140 Type 2 diabetes mellitus with diabetic neuropathy, unspecified: Secondary | ICD-10-CM

## 2022-05-11 DIAGNOSIS — R944 Abnormal results of kidney function studies: Secondary | ICD-10-CM

## 2022-05-11 DIAGNOSIS — E538 Deficiency of other specified B group vitamins: Secondary | ICD-10-CM | POA: Diagnosis not present

## 2022-05-11 DIAGNOSIS — M25561 Pain in right knee: Secondary | ICD-10-CM

## 2022-05-11 DIAGNOSIS — E1143 Type 2 diabetes mellitus with diabetic autonomic (poly)neuropathy: Secondary | ICD-10-CM | POA: Diagnosis not present

## 2022-05-11 DIAGNOSIS — Z0000 Encounter for general adult medical examination without abnormal findings: Secondary | ICD-10-CM | POA: Diagnosis not present

## 2022-05-11 DIAGNOSIS — Z1211 Encounter for screening for malignant neoplasm of colon: Secondary | ICD-10-CM | POA: Diagnosis not present

## 2022-05-11 DIAGNOSIS — E1165 Type 2 diabetes mellitus with hyperglycemia: Secondary | ICD-10-CM

## 2022-05-11 DIAGNOSIS — E039 Hypothyroidism, unspecified: Secondary | ICD-10-CM

## 2022-05-11 DIAGNOSIS — K3184 Gastroparesis: Secondary | ICD-10-CM

## 2022-05-11 DIAGNOSIS — E785 Hyperlipidemia, unspecified: Secondary | ICD-10-CM

## 2022-05-11 DIAGNOSIS — F430 Acute stress reaction: Secondary | ICD-10-CM

## 2022-05-11 DIAGNOSIS — G8929 Other chronic pain: Secondary | ICD-10-CM

## 2022-05-11 HISTORY — DX: Abnormal results of kidney function studies: R94.4

## 2022-05-11 HISTORY — DX: Pain in right knee: M25.561

## 2022-05-11 HISTORY — DX: Pain in right knee: M25.562

## 2022-05-11 NOTE — Assessment & Plan Note (Signed)
Lab Results  Component Value Date   [id number] 05/04/2022   Has not been taking Urged to get back on it and this may help energy level

## 2022-05-11 NOTE — Assessment & Plan Note (Signed)
Dexa ordered She will call to schedule Disc ca and D for bone health along with exercise

## 2022-05-11 NOTE — Patient Instructions (Addendum)
Think about the silver sneakers exercise option  This would help your mood and balance    You are due for a tetanus shot- can get at pharmacy   Call [facility] on [address] to schedule your bone density test  [phone]   For bone health  Try to get 1200-1500 mg of calcium per day with at least 2000 iu of vitamin D - for bone health If calcium constipates then just take the vitamin D alone   Push fluids for kidney health  Stop taking naproxen -this is too hard on kidney  Try voltaren gel (diclofenac) as needed instead  Aim for 64 oz of fluids per day -mostly water    For liver health  Avoid tylenol unless abs necessary  Try to avoid alcohol as much as you can   Get back on B12 and it will help your energy level   Stop at check out to make an appt with Dr Lorelei Pont for your knees

## 2022-05-11 NOTE — Assessment & Plan Note (Signed)
Some improvement with better blood sugar control

## 2022-05-11 NOTE — Assessment & Plan Note (Signed)
Disc goals for lipids and reasons to control them Rev last labs with pt Rev low sat fat diet in detail Taking zetia 10 mg daily  crestor 10 mg twice weekly  LDL of 52 Disc avoiding red meat  Trig may be up due to red meat  HDL down from less exercise

## 2022-05-11 NOTE — Assessment & Plan Note (Signed)
Doing better Under care of endocrinology

## 2022-05-11 NOTE — Assessment & Plan Note (Signed)
Colonoscopy was 03/2014 with 10 y recall - will see if she wants to do this then  Continues IBS symptoms
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
## 2022-05-11 NOTE — Assessment & Plan Note (Signed)
Hypothyroidism  Pt has no clinical changes No change in energy level/ hair or skin/ edema and no tremor  (her fatigue is baseline) Lab Results  Component Value Date   TSH 4.03 05/04/2022    Plan to continue levothyroxine 50 mcg daily

## 2022-05-11 NOTE — Assessment & Plan Note (Signed)
?   Arthritis Making her sleep restless and limits activities  Will plan appt with sport med for eval/tx

## 2022-05-11 NOTE — Assessment & Plan Note (Signed)
Conitnues hematology f/u  May be linked to fatty liver  Pl ct of 99 No new bleeding or bruising

## 2022-05-11 NOTE — Assessment & Plan Note (Signed)
AST of 43  This is improved Enc her to avoid etoh, also tylenol  Eat low fat diet and avoid wt gain Continue to watch

## 2022-05-11 NOTE — Assessment & Plan Note (Signed)
Reviewed health habits including diet and exercise and skin cancer prevention Reviewed appropriate screening tests for age  Also reviewed health mt list, fam hx and immunization status , as well as social and family history   See HPI Labs reviewed  Disc mental health issues in light of husband's problems  No mammogram due to mastectomy  Tetanus due-she will check at pharmacy  Colonoscopy utd Dexa ordered / no falls ro fx/ counseled on ca and D and exercise

## 2022-05-11 NOTE — Assessment & Plan Note (Signed)
46.3 May be multifactorial with dm and HTN Also takes naproxen-inst to stop that and use voltaren gel instead Disc inc fluids  Microalb done by diabetes specialist Will watch

## 2022-05-11 NOTE — Assessment & Plan Note (Signed)
Struggling with return of husband's depression  Reviewed stressors/ coping techniques/symptoms/ support sources/ tx options and side effects in detail today  Declines change or inc in sertraline  Enc self care and counseling if needed

## 2022-05-11 NOTE — Assessment & Plan Note (Signed)
bp in fair control at this time  BP Readings from Last 1 Encounters:  05/11/22 122/70   No changes needed Most recent labs reviewed  Disc lifstyle change with low sodium diet and exercise  Hctz 25 mg daily  Losartan 100 mg daily  Amlodipine 5 mg daily

## 2022-05-11 NOTE — Assessment & Plan Note (Signed)
Tolerates trulicity Is eating normally  Has good and bad days

## 2022-05-14 NOTE — Progress Notes (Signed)
Valerie Harris T. Itsel Opfer, MD, CAQ Sports Medicine [facility] [address] [place], [address]  Phone: [phone]  FAX: [phone]  Valerie Ball - 77 y.o. female  MRN [id number]  Date of Birth: 1946/01/30  Date: 05/15/2022  PCP: [name], MD  Referral: [name], MD  Chief Complaint  Patient presents with   Knee Pain    Bilateral   Subjective:   KIRRAH Ball is a 77 y.o. very pleasant female patient with Body mass index is 26.96 kg/m. who presents with the following:  Very pleasant 77 year old patient who presents with bilateral knee pain.  She has no history of prior knee surgery.   Knees have been bothering her for the last year or so, and they have been hurting a lot recently.  When she goes to sit down.  She has trouble rising from a seated position and sitting down.  She also has problems getting in and out of a car.  She also has problems with rotational maneuvers of the knee.  She is not having any locking up of the knee or any functional giving way.  She has been advised by primary care to avoid NSAIDs secondary to chronic kidney disease, and additionally avoid Tylenol due to some intermittently elevated LFTs.  Inj L knee  Review of Systems is noted in the HPI, as appropriate  Objective:   BP 120/70   Pulse 89   Temp 98.2 F (36.8 C) (Temporal)   Ht 5' 2.75" (1.594 m)   Wt 151 lb (68.5 kg)   LMP 03/20/1981   SpO2 94%   BMI 26.96 kg/m   GEN: No acute distress; alert,appropriate. PULM: Breathing comfortably in no respiratory distress PSYCH: Normally interactive.   Bilateral knee exam: Full extension bilaterally.  The right knee flexes to 125, and left knee flexes to 118. Left greater than right tenderness on the medial and lateral joint lines. Stable to varus and valgus stress.  Lachman and anterior and posterior drawer testing are all normal. More on the left side, she does have some pain with  flexion pinch, as well as some pain with McMurray's without mechanical symptoms. No effusion  Laboratory and Imaging Data:  Assessment and Plan:     ICD-10-CM   1. Chronic pain of both knees  M25.561 DG Knee 4 Views W/Patella Left   M25.562 triamcinolone acetonide (KENALOG-40) injection 40 mg   G89.29     2. Stage 3a chronic kidney disease (HCC)  N18.31     3. TRANSAMINASES, SERUM, ELEVATED  R74.01    R74.02      Modest osteoarthritic change in the left knee.  Joint spaces relatively well-preserved.  There is also probable bone island in the distal femur on the right.  Acute on chronic knee pain with mild osteoarthritis with exacerbation.  With her chronic kidney disease as well as elevated transaminases, she will need to continue to avoid NSAIDs, and while I think Tylenol is relatively benign, it would probably be reasonable to follow primary care suggested to limit Tylenol use, as well.  Her left knee is particularly symptomatic today, so we are going to do an intra-articular injection for symptomatic relief.  Social: Right now her knee pain is limiting her ability to walk for exercise  Aspiration/Injection Procedure Note SABRINIA RUMMEL 1945-11-09 Date of procedure: 05/15/2022  Procedure: Large Joint Aspiration / Injection of Knee, L Indications: Pain  Procedure Details Patient verbally  consented to procedure. Risks, benefits, and alternatives explained. Sterilely prepped with Chloraprep. Ethyl cholride used for anesthesia. 9 cc Lidocaine 1% mixed with 1 mL of Kenalog 40 mg injected using the anteromedial approach without difficulty. No complications with procedure and tolerated well. Patient had decreased pain post-injection. Medication: 1 mL of Kenalog 40 mg   Medication Management during today's office visit: Meds ordered this encounter  Medications   triamcinolone acetonide (KENALOG-40) injection 40 mg   There are no discontinued medications.  Orders placed today  for conditions managed today: Orders Placed This Encounter  Procedures   DG Knee 4 Views W/Patella Left    Disposition: No follow-ups on file.  Dragon [employer] One speech-to-text software was used for transcription in this dictation.  Possible transcriptional errors can occur using Animal nutritionist.   Signed,  Elpidio Galea. Martie Muhlbauer, MD   Outpatient Encounter Medications as of 05/15/2022  Medication Sig   Alpha-Lipoic Acid 600 MG TABS Take 600 mg by mouth 2 (two) times daily.   amLODipine (NORVASC) 5 MG tablet TAKE 1 TABLET DAILY   Dulaglutide (TRULICITY) 3 MG/0.5ML SOPN INJECT 0.5 ML (3 MG) UNDER THE SKIN ONCE A WEEK   empagliflozin (JARDIANCE) 25 MG TABS tablet Take 1 tablet (25 mg total) by mouth daily before breakfast.   ezetimibe (ZETIA) 10 MG tablet TAKE 1 TABLET DAILY   fluticasone (FLONASE) 50 MCG/ACT nasal spray USE 2 SPRAYS IN EACH NOSTRIL DAILY AS NEEDED FOR ALLERGIES   hydrochlorothiazide (HYDRODIURIL) 25 MG tablet TAKE 1 TABLET DAILY   levothyroxine (SYNTHROID) 50 MCG tablet TAKE 1 TABLET DAILY BEFORE BREAKFAST   losartan (COZAAR) 100 MG tablet TAKE 1 TABLET DAILY   Multiple Vitamin (MULTIVITAMIN WITH MINERALS) TABS tablet Take 1 tablet by mouth daily.   omeprazole (PRILOSEC) 20 MG capsule TAKE 1 CAPSULE DAILY   repaglinide (PRANDIN) 1 MG tablet Take 1 tablet (1 mg total) by mouth daily before supper.   rOPINIRole (REQUIP) 1 MG tablet TAKE 1 TABLET AT BEDTIME   rosuvastatin (CRESTOR) 10 MG tablet TAKE 1 TABLET TWICE A WEEK   sertraline (ZOLOFT) 100 MG tablet TAKE 1 TABLET AT BEDTIME   [DISCONTINUED] cetirizine (ZYRTEC) 10 MG tablet Take 10 mg by mouth daily as needed. For allergy symptoms   [EXPIRED] triamcinolone acetonide (KENALOG-40) injection 40 mg    No facility-administered encounter medications on file as of 05/15/2022.

## 2022-05-15 ENCOUNTER — Ambulatory Visit (INDEPENDENT_AMBULATORY_CARE_PROVIDER_SITE_OTHER): Payer: Medicare HMO | Admitting: Family Medicine

## 2022-05-15 ENCOUNTER — Encounter: Payer: Self-pay | Admitting: Family Medicine

## 2022-05-15 ENCOUNTER — Ambulatory Visit (INDEPENDENT_AMBULATORY_CARE_PROVIDER_SITE_OTHER)
Admission: RE | Admit: 2022-05-15 | Discharge: 2022-05-15 | Disposition: A | Discharge status: Home / Self Care | Payer: Medicare HMO | Source: Ambulatory Visit | Attending: Family Medicine | Admitting: Family Medicine

## 2022-05-15 VITALS — BP 120/70 | HR 89 | Temp 98.2°F | Ht 62.75 in | Wt 151.0 lb

## 2022-05-15 DIAGNOSIS — M25561 Pain in right knee: Secondary | ICD-10-CM | POA: Diagnosis not present

## 2022-05-15 DIAGNOSIS — R7402 Elevation of levels of lactic acid dehydrogenase (LDH): Secondary | ICD-10-CM | POA: Diagnosis not present

## 2022-05-15 DIAGNOSIS — N1831 Chronic kidney disease, stage 3a: Secondary | ICD-10-CM | POA: Diagnosis not present

## 2022-05-15 DIAGNOSIS — G8929 Other chronic pain: Secondary | ICD-10-CM | POA: Diagnosis not present

## 2022-05-15 DIAGNOSIS — M25562 Pain in left knee: Secondary | ICD-10-CM | POA: Diagnosis not present

## 2022-05-15 DIAGNOSIS — R7401 Elevation of levels of liver transaminase levels: Secondary | ICD-10-CM

## 2022-05-15 MED ORDER — TRIAMCINOLONE ACETONIDE 40 MG/ML IJ SUSP
40.0000 mg | Freq: Once | INTRAMUSCULAR | Status: AC
  Administered 2022-05-15: 40 mg via INTRA_ARTICULAR

## 2022-05-16 ENCOUNTER — Encounter: Payer: Self-pay | Admitting: Family Medicine

## 2022-05-25 DIAGNOSIS — H43392 Other vitreous opacities, left eye: Secondary | ICD-10-CM | POA: Diagnosis not present

## 2022-05-25 DIAGNOSIS — H34832 Tributary (branch) retinal vein occlusion, left eye, with macular edema: Secondary | ICD-10-CM | POA: Diagnosis not present

## 2022-05-25 DIAGNOSIS — H43811 Vitreous degeneration, right eye: Secondary | ICD-10-CM | POA: Diagnosis not present

## 2022-05-25 DIAGNOSIS — H35352 Cystoid macular degeneration, left eye: Secondary | ICD-10-CM | POA: Diagnosis not present

## 2022-05-25 DIAGNOSIS — H35032 Hypertensive retinopathy, left eye: Secondary | ICD-10-CM | POA: Diagnosis not present

## 2022-05-28 ENCOUNTER — Other Ambulatory Visit: Payer: Self-pay | Admitting: Family Medicine

## 2022-06-07 ENCOUNTER — Other Ambulatory Visit: Payer: Self-pay | Admitting: Family Medicine

## 2022-06-16 ENCOUNTER — Other Ambulatory Visit: Payer: Self-pay | Admitting: Family Medicine

## 2022-06-19 ENCOUNTER — Other Ambulatory Visit: Payer: Self-pay | Admitting: Family Medicine

## 2022-06-30 ENCOUNTER — Other Ambulatory Visit: Payer: Self-pay | Admitting: Family Medicine

## 2022-06-30 NOTE — Telephone Encounter (Signed)
Both last filled on 04/03/22 #90 tabs/ 0 refills, CPE was on 05/11/22

## 2022-07-24 DIAGNOSIS — H43811 Vitreous degeneration, right eye: Secondary | ICD-10-CM | POA: Diagnosis not present

## 2022-07-24 DIAGNOSIS — H43392 Other vitreous opacities, left eye: Secondary | ICD-10-CM | POA: Diagnosis not present

## 2022-07-24 DIAGNOSIS — H35032 Hypertensive retinopathy, left eye: Secondary | ICD-10-CM | POA: Diagnosis not present

## 2022-07-24 DIAGNOSIS — H35352 Cystoid macular degeneration, left eye: Secondary | ICD-10-CM | POA: Diagnosis not present

## 2022-07-24 DIAGNOSIS — H34832 Tributary (branch) retinal vein occlusion, left eye, with macular edema: Secondary | ICD-10-CM | POA: Diagnosis not present

## 2022-07-24 LAB — HM DIABETES EYE EXAM

## 2022-08-08 ENCOUNTER — Encounter: Payer: Self-pay | Admitting: Family Medicine

## 2022-08-14 NOTE — Progress Notes (Unsigned)
[facility]  [address] [place],  [place]  [address] [phone]  Clinic Day:  08/15/2022  Referring physician: Tower, Audrie Gallus, MD   HISTORY OF PRESENT ILLNESS:  Valerie Ball is a 77 y.o. female with mild thrombocytopenia. She comes in today to reassess her thrombocytopenia.  Since her last visit, the patient has been doing well.  She denies having any spontaneous bleeding/bruising issues which concern her for severe thrombocytopenia being present.  However, she has been told that her liver enzymes have gotten worse.  VITALS:  Blood pressure 125/70, pulse 83, temperature 98.2 F (36.8 C), resp. rate 14, height 5' 2.75" (1.594 m), weight 148 lb 8 oz (67.4 kg), last menstrual period 03/20/1981, SpO2 97 %.  Wt Readings from Last 3 Encounters:  08/15/22 148 lb 8 oz (67.4 kg)  05/15/22 151 lb (68.5 kg)  05/11/22 150 lb 2 oz (68.1 kg)    Body mass index is 26.52 kg/m.  Performance status (ECOG): 1 - Symptomatic but completely ambulatory  PHYSICAL EXAM:  Physical Exam Constitutional:      General: She is not in acute distress.    Appearance: Normal appearance. She is normal weight. She is not ill-appearing, toxic-appearing or diaphoretic.  HENT:     Head: Normocephalic and atraumatic.     Right Ear: Tympanic membrane normal.     Left Ear: Tympanic membrane normal.     Nose: Nose normal. No congestion or rhinorrhea.     Mouth/Throat:     Mouth: Mucous membranes are moist.     Pharynx: Oropharynx is clear. No oropharyngeal exudate or posterior oropharyngeal erythema.  Eyes:     General: No scleral icterus.       Right eye: No discharge.        Left eye: No discharge.     Extraocular Movements: Extraocular movements intact.     Conjunctiva/sclera: Conjunctivae normal.     Pupils: Pupils are equal, round, and reactive to light.  Neck:     Vascular: No carotid bruit.  Cardiovascular:     Rate and Rhythm: Normal rate and regular rhythm.      Heart sounds: No murmur heard.    No friction rub. No gallop.  Pulmonary:     Effort: Pulmonary effort is normal. No respiratory distress.     Breath sounds: Normal breath sounds. No stridor. No wheezing, rhonchi or rales.  Chest:     Chest wall: No tenderness.  Abdominal:     General: Abdomen is flat. Bowel sounds are normal. There is no distension.     Palpations: There is no mass.     Tenderness: There is no abdominal tenderness. There is no right CVA tenderness, left CVA tenderness, guarding or rebound.     Hernia: No hernia is present.  Musculoskeletal:        General: No swelling, tenderness, deformity or signs of injury. Normal range of motion.     Cervical back: Normal range of motion and neck supple. No rigidity or tenderness.     Right lower leg: No edema.     Left lower leg: No edema.  Lymphadenopathy:     Cervical: No cervical adenopathy.  Skin:    General: Skin is warm and dry.     Capillary Refill: Capillary refill takes less than 2 seconds.     Coloration: Skin is not jaundiced or pale.     Findings: No bruising, erythema, lesion or rash.  Neurological:  General: No focal deficit present.     Mental Status: She is alert and oriented to person, place, and time. Mental status is at baseline.     Cranial Nerves: No cranial nerve deficit.     Sensory: No sensory deficit.     Motor: No weakness.     Coordination: Coordination normal.     Gait: Gait normal.     Deep Tendon Reflexes: Reflexes normal.  Psychiatric:        Mood and Affect: Mood normal.        Behavior: Behavior normal.        Thought Content: Thought content normal.        Judgment: Judgment normal.      LABS:       Latest Ref Rng & Units 05/04/2022    9:09 AM 08/12/2021   12:00 AM 05/16/2021   12:00 AM  CBC  WBC 4.0 - 10.5 K/uL 4.1  4.7     6.0   Hemoglobin 12.0 - 15.0 g/dL 16.1  09.6     04.5   Hematocrit 36.0 - 46.0 % 41.9  40     43   Platelets 150.0 - 400.0 K/uL 99.0  92     123       This result is from an external source.      Latest Ref Rng & Units 05/04/2022    9:09 AM 05/16/2021   12:00 AM 05/04/2021    9:01 AM  CMP  Glucose 70 - 99 mg/dL 409   811   BUN 6 - 23 mg/dL 21  22  28    Creatinine 0.40 - 1.20 mg/dL 9.14  1.0  7.82   Sodium 135 - 145 mEq/L 143  141  142   Potassium 3.5 - 5.1 mEq/L 3.7  3.8  4.1   Chloride 96 - 112 mEq/L 102  106  103   CO2 19 - 32 mEq/L 28  25  26    Calcium 8.4 - 10.5 mg/dL 9.6  8.8  9.2   Total Protein 6.0 - 8.3 g/dL 7.4   7.8   Total Bilirubin 0.2 - 1.2 mg/dL 0.6   0.3   Alkaline Phos 39 - 117 U/L 84  96  80   AST 0 - 37 U/L 43  51  34   ALT 0 - 35 U/L 28  33  20    ASSESSMENT & PLAN:  A 77 y.o. female with mild thrombocytopenia.  Her platelet count of 93 today is not much different than what it has been in the past.  Hematologically, the patient has no pressing issues.  Clinically, the patient is doing well.  As that is the case, I will see her back in 1 year for repeat clinical assessment.  The patient understands all the plans discussed today and is in agreement with them.  Katie Moch Kirby Funk, MD

## 2022-08-15 ENCOUNTER — Inpatient Hospital Stay: Payer: Medicare HMO

## 2022-08-15 ENCOUNTER — Inpatient Hospital Stay: Payer: Medicare HMO | Attending: Oncology | Admitting: Oncology

## 2022-08-15 ENCOUNTER — Other Ambulatory Visit: Payer: Self-pay | Admitting: Oncology

## 2022-08-15 VITALS — BP 125/70 | HR 83 | Temp 98.2°F | Resp 14 | Ht 62.75 in | Wt 148.5 lb

## 2022-08-15 DIAGNOSIS — D696 Thrombocytopenia, unspecified: Secondary | ICD-10-CM | POA: Diagnosis not present

## 2022-08-15 DIAGNOSIS — C50919 Malignant neoplasm of unspecified site of unspecified female breast: Secondary | ICD-10-CM | POA: Diagnosis not present

## 2022-08-15 DIAGNOSIS — D649 Anemia, unspecified: Secondary | ICD-10-CM | POA: Diagnosis not present

## 2022-08-15 LAB — CBC: RBC: 4.89 (ref 3.87–5.11)

## 2022-08-15 LAB — CBC AND DIFFERENTIAL
HCT: 41 (ref 36–46)
Hemoglobin: 13.3 (ref 12.0–16.0)
Neutrophils Absolute: 3.04
Platelets: 93 10*3/uL — AB (ref 150–400)
WBC: 4.4

## 2022-08-21 ENCOUNTER — Telehealth: Payer: Self-pay | Admitting: Oncology

## 2022-08-21 NOTE — Telephone Encounter (Signed)
08/21/22 Left msg to confirm next appt

## 2022-09-03 DIAGNOSIS — J029 Acute pharyngitis, unspecified: Secondary | ICD-10-CM | POA: Diagnosis not present

## 2022-09-07 ENCOUNTER — Telehealth: Payer: Self-pay

## 2022-09-07 NOTE — Telephone Encounter (Signed)
Transition Care Management Unsuccessful Follow-up Telephone Call  Date of discharge and from where:  [facility] 6/16  Attempts:  1st Attempt  Reason for unsuccessful TCM follow-up call:  Left voice message   Lenard Forth [facility] Guide, [facility] [phone] [address] [place], [place], [place] [address] Phone: [phone] Email: [email or web address]

## 2022-09-08 ENCOUNTER — Telehealth: Payer: Self-pay

## 2022-09-08 NOTE — Telephone Encounter (Signed)
Transition Care Management Unsuccessful Follow-up Telephone Call  Date of discharge and from where:  [facility] 6/16  Attempts:  2nd Attempt  Reason for unsuccessful TCM follow-up call:  Left voice message   Lenard Forth [facility] Guide, [facility] [phone] [address] [place], [place], [place] [address] Phone: [phone] Email: [email or web address]

## 2022-09-12 DIAGNOSIS — H34832 Tributary (branch) retinal vein occlusion, left eye, with macular edema: Secondary | ICD-10-CM | POA: Diagnosis not present

## 2022-09-12 DIAGNOSIS — H35352 Cystoid macular degeneration, left eye: Secondary | ICD-10-CM | POA: Diagnosis not present

## 2022-09-12 DIAGNOSIS — H43392 Other vitreous opacities, left eye: Secondary | ICD-10-CM | POA: Diagnosis not present

## 2022-09-12 DIAGNOSIS — H43811 Vitreous degeneration, right eye: Secondary | ICD-10-CM | POA: Diagnosis not present

## 2022-09-12 DIAGNOSIS — H35032 Hypertensive retinopathy, left eye: Secondary | ICD-10-CM | POA: Diagnosis not present

## 2022-09-16 ENCOUNTER — Encounter: Payer: Self-pay | Admitting: Family Medicine

## 2022-09-18 ENCOUNTER — Other Ambulatory Visit: Payer: Self-pay | Admitting: Family Medicine

## 2022-09-18 NOTE — Telephone Encounter (Signed)
Converted to a phone note under pt's chart

## 2022-09-25 ENCOUNTER — Other Ambulatory Visit: Payer: Self-pay | Admitting: Family Medicine

## 2022-10-20 ENCOUNTER — Other Ambulatory Visit: Payer: Self-pay | Admitting: Internal Medicine

## 2022-10-22 ENCOUNTER — Encounter: Payer: Self-pay | Admitting: Internal Medicine

## 2022-10-23 MED ORDER — SEMAGLUTIDE (1 MG/DOSE) 4 MG/3ML ~~LOC~~ SOPN: 1.0000 mg | PEN_INJECTOR | SUBCUTANEOUS | 6 refills | Status: DC

## 2022-10-23 NOTE — Telephone Encounter (Signed)
Trulicity 3 mg is on back order please advise on Alternative in Dr. Elvera Lennox absence.

## 2022-10-31 DIAGNOSIS — H35352 Cystoid macular degeneration, left eye: Secondary | ICD-10-CM | POA: Diagnosis not present

## 2022-10-31 DIAGNOSIS — H35032 Hypertensive retinopathy, left eye: Secondary | ICD-10-CM | POA: Diagnosis not present

## 2022-10-31 DIAGNOSIS — H02836 Dermatochalasis of left eye, unspecified eyelid: Secondary | ICD-10-CM | POA: Diagnosis not present

## 2022-10-31 DIAGNOSIS — H43392 Other vitreous opacities, left eye: Secondary | ICD-10-CM | POA: Diagnosis not present

## 2022-10-31 DIAGNOSIS — H43811 Vitreous degeneration, right eye: Secondary | ICD-10-CM | POA: Diagnosis not present

## 2022-10-31 DIAGNOSIS — H02833 Dermatochalasis of right eye, unspecified eyelid: Secondary | ICD-10-CM | POA: Diagnosis not present

## 2022-10-31 DIAGNOSIS — H34832 Tributary (branch) retinal vein occlusion, left eye, with macular edema: Secondary | ICD-10-CM | POA: Diagnosis not present

## 2022-10-31 LAB — HM DIABETES EYE EXAM

## 2022-11-02 ENCOUNTER — Encounter: Payer: Self-pay | Admitting: Internal Medicine

## 2022-11-02 ENCOUNTER — Ambulatory Visit (INDEPENDENT_AMBULATORY_CARE_PROVIDER_SITE_OTHER): Payer: Medicare HMO | Admitting: Internal Medicine

## 2022-11-02 VITALS — BP 122/70 | HR 83 | Ht 62.75 in | Wt 149.0 lb

## 2022-11-02 DIAGNOSIS — E1142 Type 2 diabetes mellitus with diabetic polyneuropathy: Secondary | ICD-10-CM | POA: Diagnosis not present

## 2022-11-02 DIAGNOSIS — E1165 Type 2 diabetes mellitus with hyperglycemia: Secondary | ICD-10-CM | POA: Diagnosis not present

## 2022-11-02 DIAGNOSIS — E1169 Type 2 diabetes mellitus with other specified complication: Secondary | ICD-10-CM

## 2022-11-02 DIAGNOSIS — E1140 Type 2 diabetes mellitus with diabetic neuropathy, unspecified: Secondary | ICD-10-CM | POA: Diagnosis not present

## 2022-11-02 DIAGNOSIS — E119 Type 2 diabetes mellitus without complications: Secondary | ICD-10-CM

## 2022-11-02 DIAGNOSIS — Z7984 Long term (current) use of oral hypoglycemic drugs: Secondary | ICD-10-CM | POA: Diagnosis not present

## 2022-11-02 DIAGNOSIS — Z7985 Long-term (current) use of injectable non-insulin antidiabetic drugs: Secondary | ICD-10-CM

## 2022-11-02 DIAGNOSIS — E785 Hyperlipidemia, unspecified: Secondary | ICD-10-CM

## 2022-11-02 LAB — HEMOGLOBIN A1C: Hemoglobin A1C: 6.4

## 2022-11-02 MED ORDER — EMPAGLIFLOZIN 25 MG PO TABS: 25.0000 mg | ORAL_TABLET | Freq: Every day | ORAL | 3 refills | Status: DC
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
## 2022-11-02 NOTE — Patient Instructions (Addendum)
Please continue: - Jardiance 25 mg before b'fast - Prandin 1 mg before a larger dinner - Ozempic 1 mg weekly (if you still have diarrhea, may need to reduce the dose to 0.5 mg weekly (36 clicks)  Please return in 6 months with your sugar log.

## 2022-12-01 ENCOUNTER — Encounter: Payer: Self-pay | Admitting: Internal Medicine

## 2022-12-01 ENCOUNTER — Other Ambulatory Visit: Payer: Self-pay

## 2022-12-01 DIAGNOSIS — E1140 Type 2 diabetes mellitus with diabetic neuropathy, unspecified: Secondary | ICD-10-CM

## 2022-12-01 MED ORDER — TRULICITY 3 MG/0.5ML ~~LOC~~ SOAJ
SUBCUTANEOUS | 3 refills | Status: DC
Start: 2022-12-01 — End: 2023-10-01

## 2022-12-07 ENCOUNTER — Encounter: Payer: Self-pay | Admitting: Internal Medicine

## 2022-12-08 ENCOUNTER — Encounter: Payer: Self-pay | Admitting: Internal Medicine

## 2022-12-11 ENCOUNTER — Emergency Department (HOSPITAL_COMMUNITY): Payer: Medicare HMO

## 2022-12-11 ENCOUNTER — Encounter (HOSPITAL_COMMUNITY): Payer: Self-pay | Admitting: Emergency Medicine

## 2022-12-11 ENCOUNTER — Emergency Department (HOSPITAL_COMMUNITY)
Admission: EM | Admit: 2022-12-11 | Discharge: 2022-12-12 | Disposition: A | Discharge status: Home / Self Care | Payer: Medicare HMO | Attending: Emergency Medicine | Admitting: Emergency Medicine

## 2022-12-11 ENCOUNTER — Telehealth: Payer: Self-pay | Admitting: Family Medicine

## 2022-12-11 DIAGNOSIS — R0602 Shortness of breath: Secondary | ICD-10-CM | POA: Diagnosis not present

## 2022-12-11 DIAGNOSIS — I6782 Cerebral ischemia: Secondary | ICD-10-CM | POA: Diagnosis not present

## 2022-12-11 DIAGNOSIS — R9389 Abnormal findings on diagnostic imaging of other specified body structures: Secondary | ICD-10-CM | POA: Diagnosis not present

## 2022-12-11 DIAGNOSIS — I10 Essential (primary) hypertension: Secondary | ICD-10-CM | POA: Diagnosis not present

## 2022-12-11 DIAGNOSIS — E119 Type 2 diabetes mellitus without complications: Secondary | ICD-10-CM | POA: Diagnosis not present

## 2022-12-11 DIAGNOSIS — R9089 Other abnormal findings on diagnostic imaging of central nervous system: Secondary | ICD-10-CM | POA: Diagnosis not present

## 2022-12-11 DIAGNOSIS — R079 Chest pain, unspecified: Secondary | ICD-10-CM | POA: Insufficient documentation

## 2022-12-11 DIAGNOSIS — Z7984 Long term (current) use of oral hypoglycemic drugs: Secondary | ICD-10-CM | POA: Insufficient documentation

## 2022-12-11 DIAGNOSIS — R0789 Other chest pain: Secondary | ICD-10-CM

## 2022-12-11 DIAGNOSIS — R42 Dizziness and giddiness: Secondary | ICD-10-CM | POA: Diagnosis not present

## 2022-12-11 DIAGNOSIS — Z79899 Other long term (current) drug therapy: Secondary | ICD-10-CM | POA: Insufficient documentation

## 2022-12-11 LAB — CBC WITH DIFFERENTIAL/PLATELET
Abs Immature Granulocytes: 0.02 10*3/uL (ref 0.00–0.07)
Basophils Absolute: 0 10*3/uL (ref 0.0–0.1)
Basophils Relative: 1 %
Eosinophils Absolute: 0.1 10*3/uL (ref 0.0–0.5)
Eosinophils Relative: 1 %
HCT: 46 % (ref 36.0–46.0)
Hemoglobin: 14.5 g/dL (ref 12.0–15.0)
Immature Granulocytes: 0 %
Lymphocytes Relative: 28 %
Lymphs Abs: 1.7 10*3/uL (ref 0.7–4.0)
MCH: 26.8 pg (ref 26.0–34.0)
MCHC: 31.5 g/dL (ref 30.0–36.0)
MCV: 85 fL (ref 80.0–100.0)
Monocytes Absolute: 0.5 10*3/uL (ref 0.1–1.0)
Monocytes Relative: 9 %
Neutro Abs: 3.8 10*3/uL (ref 1.7–7.7)
Neutrophils Relative %: 61 %
Platelets: 138 10*3/uL — ABNORMAL LOW (ref 150–400)
RBC: 5.41 MIL/uL — ABNORMAL HIGH (ref 3.87–5.11)
RDW: 14.6 % (ref 11.5–15.5)
WBC: 6.1 10*3/uL (ref 4.0–10.5)
nRBC: 0 % (ref 0.0–0.2)

## 2022-12-11 LAB — TROPONIN I (HIGH SENSITIVITY): Troponin I (High Sensitivity): 3 ng/L (ref <18)

## 2022-12-11 LAB — COMPREHENSIVE METABOLIC PANEL
ALT: 31 U/L (ref 0–44)
AST: 38 U/L (ref 15–41)
Albumin: 4.2 g/dL (ref 3.5–5.0)
Alkaline Phosphatase: 76 U/L (ref 38–126)
Anion gap: 12 (ref 5–15)
BUN: 18 mg/dL (ref 8–23)
CO2: 23 mmol/L (ref 22–32)
Calcium: 9.4 mg/dL (ref 8.9–10.3)
Chloride: 104 mmol/L (ref 98–111)
Creatinine, Ser: 1.2 mg/dL — ABNORMAL HIGH (ref 0.44–1.00)
GFR, Estimated: 47 mL/min — ABNORMAL LOW (ref >60)
Glucose, Bld: 153 mg/dL — ABNORMAL HIGH (ref 70–99)
Potassium: 3.6 mmol/L (ref 3.5–5.1)
Sodium: 139 mmol/L (ref 135–145)
Total Bilirubin: 0.5 mg/dL (ref 0.3–1.2)
Total Protein: 7.8 g/dL (ref 6.5–8.1)

## 2022-12-11 LAB — D-DIMER, QUANTITATIVE: D-Dimer, Quant: <0.27 ug/mL-FEU (ref 0.00–0.50)

## 2022-12-11 LAB — LIPASE, BLOOD: Lipase: 38 U/L (ref 11–51)

## 2022-12-11 NOTE — ED Triage Notes (Signed)
Pt reports weakness and malaise for weeks. She reports OB and dizziness as well. Called PCP and triage nurse told her to come to ED. No falls recently She feels as though she is spinning not the room. This is intermittent but getting more frequent. She reports SOB with any exertion and that is new for her. Denies cough fevers but having little bit of chest pain- chalked up to hiatal hernia. Deep inspiration causes back and chest and shoulder blade pain.

## 2022-12-11 NOTE — Telephone Encounter (Signed)
Aware, will watch for correspondence Agree with advisement

## 2022-12-11 NOTE — Telephone Encounter (Signed)
FYI: This call has been transferred to Access Nurse. Once the result note has been entered staff can address the message at that time.  Patient called in with the following symptoms:  Red Word:dizziness & chest pressure    Please advise at [phone]  Message is routed to Provider Pool and [facility] Triage    Pt's husband, Onalee Hua, called stating the pt has been experiencing some dizziness, chest pressure & has a upset stomach. No other symptoms. Scheduled pt with Dr. Reece Agar for tomorrow, 9/24 @ 12pm. Transferred pt to access nurse.

## 2022-12-11 NOTE — Telephone Encounter (Signed)
Per triage patient needed to be seen in 4 hours. Received call from triage verified we do not have openings. Patient has agreed to go to urgent care for evaluation.

## 2022-12-12 ENCOUNTER — Emergency Department (HOSPITAL_COMMUNITY): Payer: Medicare HMO

## 2022-12-12 ENCOUNTER — Ambulatory Visit: Payer: Medicare HMO | Admitting: Family Medicine

## 2022-12-12 DIAGNOSIS — R42 Dizziness and giddiness: Secondary | ICD-10-CM | POA: Diagnosis not present

## 2022-12-12 DIAGNOSIS — R9089 Other abnormal findings on diagnostic imaging of central nervous system: Secondary | ICD-10-CM | POA: Diagnosis not present

## 2022-12-12 DIAGNOSIS — I6782 Cerebral ischemia: Secondary | ICD-10-CM | POA: Diagnosis not present

## 2022-12-12 LAB — TROPONIN I (HIGH SENSITIVITY): Troponin I (High Sensitivity): 4 ng/L (ref <18)

## 2022-12-12 MED ORDER — MIDAZOLAM HCL 2 MG/2ML IJ SOLN: 2.0000 mg | Freq: Once | INTRAMUSCULAR | Status: DC

## 2022-12-12 MED ORDER — MIDAZOLAM HCL 2 MG/2ML IJ SOLN
2.0000 mg | Freq: Once | INTRAMUSCULAR | Status: AC
  Administered 2022-12-12: 2 mg via INTRAVENOUS
  Filled 2022-12-12: qty 2

## 2022-12-12 MED ORDER — ONDANSETRON HCL 4 MG/2ML IJ SOLN
4.0000 mg | Freq: Four times a day (QID) | INTRAMUSCULAR | Status: DC | PRN
  Administered 2022-12-12: 4 mg via INTRAVENOUS
  Filled 2022-12-12 (×2): qty 2

## 2022-12-12 MED ORDER — LORAZEPAM 1 MG PO TABS
0.5000 mg | ORAL_TABLET | Freq: Once | ORAL | Status: AC | PRN
  Administered 2022-12-12: 0.5 mg via ORAL
  Filled 2022-12-12: qty 1
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
## 2022-12-12 NOTE — ED Provider Notes (Signed)
[facility] AT [facility] Provider Note   CSN: [id number] Arrival date & time: 12/11/22  1848     History  Chief Complaint  Patient presents with   Dizziness   Shortness of Breath    Valerie Ball is a 77 y.o. female.  Patient presents to the emergency department complaining of dizziness, shortness of breath, and intermittently sharp chest pains which been ongoing for approximately 2 weeks.  The patient describes her dizziness as a loss of balance.  She denies feeling like the room is spinning around her.  She states it is much worse when she stands that she feels she is off balance when walking.  She denies any unilateral weakness, any speech changes, any decrease sensation.  The patient also complains of a vague worsening shortness of breath over the past few weeks and an intermittent chest pain which seems to go from under the left side of her chest back towards the shoulder blade.  She does not take blood thinners.  She is currently not experiencing chest pain and has room air saturations of 96+ percent.  The patient denies abdominal pain, nausea, vomiting.  Past medical history significant for type II DM with neuropathy, anxiety, hypertension, hiatal hernia, elevated transaminases, thrombocytopenia, dizziness, seizures   Dizziness Associated symptoms: shortness of breath   Shortness of Breath      Home Medications Prior to Admission medications   Medication Sig Start Date End Date Taking? Authorizing Provider  Alpha-Lipoic Acid 600 MG TABS Take 600 mg by mouth 2 (two) times daily. 05/09/19   Carlus Pavlov, MD  amLODipine (NORVASC) 5 MG tablet TAKE 1 TABLET DAILY 06/20/22   Tower, Audrie Gallus, MD  Dulaglutide (TRULICITY) 3 MG/0.5ML SOPN INJECT 0.5 ML (3 MG) UNDER THE SKIN ONCE A WEEK 12/01/22   Carlus Pavlov, MD  empagliflozin (JARDIANCE) 25 MG TABS tablet Take 1 tablet (25 mg total) by mouth daily before breakfast. 11/02/22   Carlus Pavlov, MD   ezetimibe (ZETIA) 10 MG tablet TAKE 1 TABLET DAILY 06/30/22   Tower, Audrie Gallus, MD  fluticasone (FLONASE) 50 MCG/ACT nasal spray USE 2 SPRAYS IN EACH NOSTRIL DAILY AS NEEDED FOR ALLERGIES 03/10/21   Tower, Audrie Gallus, MD  hydrochlorothiazide (HYDRODIURIL) 25 MG tablet TAKE 1 TABLET DAILY 09/25/22   Tower, Audrie Gallus, MD  levothyroxine (SYNTHROID) 50 MCG tablet TAKE 1 TABLET DAILY BEFORE BREAKFAST 09/19/22   Tower, Audrie Gallus, MD  losartan (COZAAR) 100 MG tablet TAKE 1 TABLET DAILY 06/19/22   Tower, Audrie Gallus, MD  Multiple Vitamin (MULTIVITAMIN WITH MINERALS) TABS tablet Take 1 tablet by mouth daily.    [provider]  omeprazole (PRILOSEC) 20 MG capsule TAKE 1 CAPSULE DAILY 06/07/22   Tower, Audrie Gallus, MD  repaglinide (PRANDIN) 1 MG tablet TAKE 1 TABLET DAILY BEFORE SUPPER 10/20/22   Carlus Pavlov, MD  rOPINIRole (REQUIP) 1 MG tablet TAKE 1 TABLET AT BEDTIME 06/30/22   Tower, Audrie Gallus, MD  rosuvastatin (CRESTOR) 10 MG tablet TAKE 1 TABLET TWICE A WEEK 05/29/22   Tower, Audrie Gallus, MD  Semaglutide, 1 MG/DOSE, 4 MG/3ML SOPN Inject 1 mg as directed once a week. 10/23/22   Carlus Pavlov, MD  sertraline (ZOLOFT) 100 MG tablet TAKE 1 TABLET AT BEDTIME 09/19/22   Tower, Audrie Gallus, MD  cetirizine (ZYRTEC) 10 MG tablet Take 10 mg by mouth daily as needed. For allergy symptoms  06/08/11  [provider]      Allergies    Arimidex [  anastrozole], Glipizide, Metformin and related, and Penicillins    Review of Systems   Review of Systems  Respiratory:  Positive for shortness of breath.   Neurological:  Positive for dizziness.    Physical Exam Updated Vital Signs BP 117/71   Pulse 87   Temp 99 F (37.2 C) (Oral)   Resp 14   LMP 03/20/1981   SpO2 96%  Physical Exam Vitals and nursing note reviewed.  Constitutional:      General: She is not in acute distress.    Appearance: She is well-developed.  HENT:     Head: Normocephalic and atraumatic.  Eyes:     Conjunctiva/sclera: Conjunctivae normal.   Cardiovascular:     Rate and Rhythm: Normal rate and regular rhythm.     Heart sounds: No murmur heard. Pulmonary:     Effort: Pulmonary effort is normal. No respiratory distress.     Breath sounds: Normal breath sounds.  Abdominal:     Palpations: Abdomen is soft.     Tenderness: There is no abdominal tenderness.  Musculoskeletal:        General: No swelling.     Cervical back: Neck supple.  Skin:    General: Skin is warm and dry.     Capillary Refill: Capillary refill takes less than 2 seconds.  Neurological:     General: No focal deficit present.     Mental Status: She is alert.     Comments: Cranial nerves II through VII, XI, XII grossly intact  Psychiatric:        Mood and Affect: Mood normal.     ED Results / Procedures / Treatments   Labs (all labs ordered are listed, but only abnormal results are displayed) Labs Reviewed  COMPREHENSIVE METABOLIC PANEL - Abnormal; Notable for the following components:      Result Value   Glucose, Bld 153 (*)    Creatinine, Ser 1.20 (*)    GFR, Estimated 47 (*)    All other components within normal limits  CBC WITH DIFFERENTIAL/PLATELET - Abnormal; Notable for the following components:   RBC 5.41 (*)    Platelets 138 (*)    All other components within normal limits  D-DIMER, QUANTITATIVE  LIPASE, BLOOD  TROPONIN I (HIGH SENSITIVITY)  TROPONIN I (HIGH SENSITIVITY)    EKG None  Radiology DG Chest 2 View  Result Date: 12/11/2022 CLINICAL DATA:  Chest pain, shortness of breath, and dizziness. EXAM: CHEST - 2 VIEW COMPARISON:  05/09/2021. FINDINGS: The heart size and mediastinal contours are within normal limits. There is atherosclerotic calcification of the aorta. Chronic elevation of the right diaphragm is noted. No consolidation, effusion, or pneumothorax. Surgical clips are present in the left axilla. There are degenerative changes in the thoracic spine. No acute osseous abnormality. IMPRESSION: No active cardiopulmonary  disease. Electronically Signed   By: Thornell Sartorius M.D.   On: 12/11/2022 21:52   CT Head Wo Contrast  Result Date: 12/11/2022 CLINICAL DATA:  Vertigo, central. EXAM: CT HEAD WITHOUT CONTRAST TECHNIQUE: Contiguous axial images were obtained from the base of the skull through the vertex without intravenous contrast. RADIATION DOSE REDUCTION: This exam was performed according to the departmental dose-optimization program which includes automated exposure control, adjustment of the mA and/or kV according to patient size and/or use of iterative reconstruction technique. COMPARISON:  07/01/2015. FINDINGS: Brain: No acute intracranial hemorrhage, midline shift or mass effect. No extra-axial fluid collection. Extensive scattered subcortical and periventricular white matter hypodensities are present bilaterally. No hydrocephalus.  A stable hypodensity is noted in the basal ganglia on the right. Senescent calcifications are noted in the basal ganglia bilaterally. Vascular: No hyperdense vessel or unexpected calcification. Skull: Normal. Negative for fracture or focal lesion. Sinuses/Orbits: No acute finding. Other: None. IMPRESSION: 1. No acute intracranial process. 2. Extensive chronic microvascular ischemic changes. If there is clinical concern for acute infarct, MRI may be beneficial for further evaluation. Electronically Signed   By: Thornell Sartorius M.D.   On: 12/11/2022 21:50    Procedures Procedures    Medications Ordered in ED Medications  LORazepam (ATIVAN) tablet 0.5 mg (has no administration in time range)    ED Course/ Medical Decision Making/ A&P                                 Medical Decision Making Amount and/or Complexity of Data Reviewed Labs: ordered. Radiology: ordered.  Risk Prescription drug management.   This patient presents to the ED for concern of dizziness, shortness of breath, chest pains, this involves an extensive number of treatment options, and is a complaint that  carries with it a high risk of complications and morbidity.  The differential diagnosis includes orthostatic changes, dysrhythmia, vertigo, CVA.  Differential for shortness of breath includes COPD, CHF, pneumonia, deconditioning, others.  Differential for the chest pains includes musculoskeletal pain, ACS, pneumonia, PE, others   Co morbidities that complicate the patient evaluation  Type II DM, hiatal hernia   Additional history obtained:  Additional history obtained from family at bedside External records from outside source obtained and reviewed including primary care notes   Lab Tests:  I Ordered, and personally interpreted labs.  The pertinent results include: Negative D-dimer, creatinine at baseline, lipase 38, initial troponin 3, repeat 4   Imaging Studies ordered:  I ordered imaging studies including chest x-ray and CT head without contrast I independently visualized and interpreted imaging which showed no active disease on chest x-ray. 1. No acute intracranial process.  2. Extensive chronic microvascular ischemic changes. If there is  clinical concern for acute infarct, MRI may be beneficial for  further evaluation.   I agree with the radiologist interpretation I ordered MR brain which is pending at shift handoff  Cardiac Monitoring: / EKG:  The patient was maintained on a cardiac monitor.  I personally viewed and interpreted the cardiac monitored which showed an underlying rhythm of: Sinus rhythm   Problem List / ED Course / Critical interventions / Medication management  I ordered medication including Ativan for pre-MRI anxiety Reevaluation of the patient after these medicines showed that the patient improved I have reviewed the patients home medicines and have made adjustments as needed    Test / Admission - Considered:  Patient care being transferred to [facility], PA-C at shift handoff. No concern of ACS with negative troponins x 2, nonischemic EKG.  No pneumonia on chest x-ray. Negative d-dimer.  Disposition pending results of MRI. If negative can likely discharge home and follow up with primary care.          Final Clinical Impression(s) / ED Diagnoses Final diagnoses:  None    Rx / DC Orders ED Discharge Orders     None         Pamala Duffel 12/12/22 1610    Ernie Avena, MD 12/12/22 2202

## 2022-12-12 NOTE — ED Provider Notes (Signed)
Accepted handoff at shift change from [facility]. Please see prior provider note for more detail.   Briefly: Patient is 77 y.o. "complaining of dizziness, shortness of breath, and intermittently sharp chest pains which been ongoing for approximately 2 weeks. The patient describes her dizziness as a loss of balance. She denies feeling like the room is spinning around her. She states it is much worse when she stands that she feels she is off balance when walking. She denies any unilateral weakness, any speech changes, any decrease sensation. The patient also complains of a vague worsening shortness of breath over the past few weeks and an intermittent chest pain which seems to go from under the left side of her chest back towards the shoulder blade. She does not take blood thinners. She is currently not experiencing chest pain and has room air saturations of 96+ percent. The patient denies abdominal pain, nausea, vomiting. Past medical history significant for type II DM with neuropathy, anxiety, hypertension, hiatal hernia, elevated transaminases, thrombocytopenia, dizziness, seizures"  DDX: concern for includes orthostatic changes, dysrhythmia, vertigo, CVA.  Differential for shortness of breath includes COPD, CHF, pneumonia, deconditioning, others.  Differential for the chest pains includes musculoskeletal pain, ACS, pneumonia, PE, others   Plan:  - Dispo pending MR Brain - Patient went to MRI earlier this morning, but had severe anxiety despite the oral ativan she had received earlier. Patient started vomiting with anxiety. Patient brought back to her room and I provided her with some Zofran. Patient stating that she feels better. MRI was able to schedule patient again at 1pm. Patient given versed this time and obtained MRI without complication. - MR brain without acute abnormality. - repeat neuro exam: GCS 15. Speech is goal oriented. No deficits appreciated to CN III-XII; symmetric eyebrow  raise, no facial drooping, tongue midline. Patient has equal grip strength bilaterally with 5/5 strength against resistance in all major muscle groups bilaterally. Sensation to light touch intact. Patient moves extremities without ataxia.  - Patient has been ambulated to bathroom in ED without difficulty.  - Talked with patient who states she started ozympic earlier this month.  Blood sugar reassuring today. Recommended patient follow-up with primary care provider to ensure that she is not having ADRs from medications. Patient verbalized understanding of plan. Patient also stating that she believes her chest pain is coming from her hiatal hernia. Patient closely follows with GI and states that she can meet with them later this week.  -Patient afebrile with stable vitals.  Provided with return precautions.  Discharged in good condition. Patient's husband will drive her home.      Dorthy Cooler, [place] 12/12/22 1414    Terald Sleeper, MD 12/12/22 [phone]

## 2022-12-12 NOTE — Discharge Instructions (Addendum)
It was a pleasure caring for you today.  Lab workup and imaging was reassuring.  Please follow-up with your primary care provider and GI specialist.  Seek emergency care if experiencing any new or worsening symptoms.

## 2022-12-15 ENCOUNTER — Inpatient Hospital Stay: Payer: Medicare HMO | Admitting: Family Medicine

## 2022-12-26 ENCOUNTER — Inpatient Hospital Stay: Payer: Medicare HMO | Admitting: Family Medicine

## 2022-12-26 DIAGNOSIS — H34832 Tributary (branch) retinal vein occlusion, left eye, with macular edema: Secondary | ICD-10-CM | POA: Diagnosis not present

## 2022-12-26 DIAGNOSIS — H43811 Vitreous degeneration, right eye: Secondary | ICD-10-CM | POA: Diagnosis not present

## 2022-12-26 DIAGNOSIS — H35352 Cystoid macular degeneration, left eye: Secondary | ICD-10-CM | POA: Diagnosis not present

## 2022-12-26 DIAGNOSIS — H35032 Hypertensive retinopathy, left eye: Secondary | ICD-10-CM | POA: Diagnosis not present

## 2022-12-26 DIAGNOSIS — H43392 Other vitreous opacities, left eye: Secondary | ICD-10-CM | POA: Diagnosis not present

## 2022-12-29 ENCOUNTER — Encounter: Payer: Self-pay | Admitting: Family Medicine

## 2022-12-29 ENCOUNTER — Ambulatory Visit: Payer: Medicare HMO | Admitting: Family Medicine

## 2022-12-29 VITALS — BP 120/62 | HR 87 | Temp 98.4°F | Ht 62.75 in | Wt 145.5 lb

## 2022-12-29 DIAGNOSIS — I10 Essential (primary) hypertension: Secondary | ICD-10-CM | POA: Diagnosis not present

## 2022-12-29 DIAGNOSIS — E1143 Type 2 diabetes mellitus with diabetic autonomic (poly)neuropathy: Secondary | ICD-10-CM

## 2022-12-29 DIAGNOSIS — K3184 Gastroparesis: Secondary | ICD-10-CM

## 2022-12-29 DIAGNOSIS — K449 Diaphragmatic hernia without obstruction or gangrene: Secondary | ICD-10-CM

## 2022-12-29 DIAGNOSIS — Z7985 Long-term (current) use of injectable non-insulin antidiabetic drugs: Secondary | ICD-10-CM

## 2022-12-29 DIAGNOSIS — Z23 Encounter for immunization: Secondary | ICD-10-CM

## 2022-12-29 DIAGNOSIS — F430 Acute stress reaction: Secondary | ICD-10-CM | POA: Diagnosis not present

## 2022-12-29 DIAGNOSIS — R42 Dizziness and giddiness: Secondary | ICD-10-CM | POA: Diagnosis not present

## 2022-12-29 DIAGNOSIS — R079 Chest pain, unspecified: Secondary | ICD-10-CM

## 2022-12-29 HISTORY — DX: Chest pain, unspecified: R07.9

## 2022-12-29 MED ORDER — FAMOTIDINE 20 MG PO TABS: 20.0000 mg | ORAL_TABLET | Freq: Two times a day (BID) | ORAL | 0 refills | Status: DC

## 2022-12-29 NOTE — Patient Instructions (Addendum)
Skip the trulicity 1-2 weeks  Touch base with Dr Lafe Garin also   I put the referral in for cardiology  Please let us know if you don't hear in 1-2 weeks   Continue the prilosec  Add generic pepcid 20 mg twice daily for a month to calm down acid more     Follow up here in about a month

## 2022-12-29 NOTE — Assessment & Plan Note (Signed)
Husband with mental health issues   Reviewed stressors/ coping techniques/symptoms/ support sources/ tx options and side effects in detail today Encouraged good self care  May be exac her physical symptoms  Taking sertraline Will discussed further at f/u

## 2022-12-29 NOTE — Assessment & Plan Note (Signed)
With loss of balance Reviewed hospital records, lab results and studies in detail  MRI brain is reassuring  Unsure if associated with feeling of weakness/less po intake / gastroparesis  Plan follow up 1 month  Encouraged hydratoin if able  Reassuring exam today

## 2022-12-29 NOTE — Assessment & Plan Note (Signed)
May be adding to cp  On ppi Will add pepcid 20 mg bid for a month and follow up

## 2022-12-29 NOTE — Assessment & Plan Note (Signed)
I do suspect HH and gastroparesis play a role  Also would like her to see cardiology to discuss vascular risk factors   Reviewed hospital records, lab results and studies in detail  Reassuring exam today   Instructed to hold trulicity to see if this helps Also sent in pepcid  Continue ppi   Call back and Er precautions noted in detail today

## 2022-12-29 NOTE — Assessment & Plan Note (Signed)
bp in fair control at this time  BP Readings from Last 1 Encounters:  12/29/22 120/62   No changes needed Most recent labs reviewed  Disc lifstyle change with low sodium diet and exercise  Hctz 25 mg daily  Losartan 100 mg daily  Amlodipine 5 mg daily

## 2022-12-29 NOTE — Assessment & Plan Note (Signed)
Trulicity may be worsening this- nausea/abd and chest pain  Will hold it 1-2 weeks to see if improvement  Encouraged to touch base with endocrinology

## 2022-12-29 NOTE — Progress Notes (Signed)
Subjective:    Patient ID: Valerie Ball, female    DOB: 03-15-1946, 77 y.o.   MRN: [id number]  HPI  Wt Readings from Last 3 Encounters:  12/29/22 145 lb 8 oz (66 kg)  11/02/22 149 lb (67.6 kg)  08/15/22 148 lb 8 oz (67.4 kg)   25.98 kg/m  Vitals:   12/29/22 1043  BP: 120/62  Pulse: 87  Temp: 98.4 F (36.9 C)  SpO2: 97%    Pt presents for follow up of ER visit for dizziness on 9/23 Also flu shot   Presented with dizziness and poor balance , intermittent sharp cp and shortness of breath  Had MR brain- reassuring  Normal neuro exam Did improve in ER Was treatment with zofran   Discussed possible that is reaction to GLP medicine  Thought cp was from [facility]  Also has history of dm gastroparesis   Lab Results  Component Value Date   NA 139 12/11/2022   K 3.6 12/11/2022   CO2 23 12/11/2022   GLUCOSE 153 (H) 12/11/2022   BUN 18 12/11/2022   CREATININE 1.20 (H) 12/11/2022   CALCIUM 9.4 12/11/2022   GFR 46.38 (L) 05/04/2022   EGFR 61 (L) 05/28/2014   GFRNONAA 47 (L) 12/11/2022   Lab Results  Component Value Date   ALT 31 12/11/2022   AST 38 12/11/2022   ALKPHOS 76 12/11/2022   BILITOT 0.5 12/11/2022   Lab Results  Component Value Date   WBC 6.1 12/11/2022   HGB 14.5 12/11/2022   HCT 46.0 12/11/2022   MCV 85.0 12/11/2022   PLT 138 (L) 12/11/2022   Lab Results  Component Value Date   LIPASE 38 12/11/2022   Trop 3  Cxr clear    MR report IMPRESSION: 1. No acute intracranial abnormality. 2. Advanced chronic microvascular ischemic changes of the white matter, progressed when compared to prior MRI. 3. Mild parenchymal volume loss, also progressed.    Now:  Improved but not 100%   Still some dizziness Some nausea  More shortness of breath on exertion      (does use cardio machine at least 30 min sitting) and that does not bother her  Still has some chest pains / occational a heavy feeling   Prilosec 20 mg daily  Seldom heartburn   Has endo  appointment in jan   Stress- dealing with husband    Also  Pain in upper abdomen and back taking deep breath   Involuntary gasp for breath   Thinks head is shaking  Some knots in neck   Joint pain- knees/ hand/ shoulders   Hemorrhoids that bleed   Patient Active Problem List   Diagnosis Date Noted   Chest pain 12/29/2022   Bilateral knee pain 05/11/2022   Decreased calculated GFR 05/11/2022   Blurred vision, right eye 08/11/2021   History of COVID-19 09/24/2020   Fever, low grade 09/24/2020   Posterior vitreous detachment of right eye 11/06/2019   Cystoid macular edema of left eye 07/14/2019   Branch retinal vein occlusion with macular edema of left eye 07/14/2019   Hypertensive retinopathy of left eye 07/14/2019   Vitreous floaters of left eye 07/14/2019   Fatigue 05/20/2018   Hip pain, bilateral 08/31/2017   Vitamin B12 deficiency 09/21/2016   Diabetic gastroparesis (HCC) 03/27/2016   Retinal vein occlusion, branch 01/03/2016   Alternating constipation and diarrhea 08/12/2015   AVM (arteriovenous malformation) of colon 05/18/2015   IBS (irritable bowel syndrome) 02/28/2015   Diverticulitis of  colon 10/14/2014   Gout 07/22/2014   Hypothyroidism 04/23/2014   Encounter for Medicare annual wellness exam 01/23/2014   Estrogen deficiency 01/23/2014   Colon cancer screening 01/23/2014   Tremor 01/20/2014   Dizziness 01/07/2014   Allergic rhinitis 01/07/2014   Thrombocytopenia (HCC) 11/25/2013   Diabetic neuropathy (HCC) 11/25/2013   Lap Nissen October 2013 01/19/2012   Large type III mixed hiatus hernia  12/06/2011   Stress reaction 10/25/2011   Routine general medical examination at a health care facility 07/11/2011   TRANSAMINASES, SERUM, ELEVATED 11/11/2009   History of breast cancer 08/02/2009   ESOPHAGEAL STRICTURE 03/02/2008   Diaphragmatic hernia 03/02/2008   Peritoneal adhesions 03/02/2008   Anemia 02/07/2008   Poorly controlled type 2 diabetes mellitus  with neuropathy (HCC) 08/19/2007   GAD (generalized anxiety disorder) 04/15/2007   DISORDERS, ORGANIC SLEEP NEC 08/23/2006   SYNDROME, RESTLESS LEGS 08/23/2006   SYNDROME, CARPAL TUNNEL 08/23/2006   Hyperlipidemia associated with type 2 diabetes mellitus (HCC) 08/22/2006   Essential hypertension 08/22/2006   Allergic rhinitis 08/22/2006   Fatty liver 08/22/2006   History of seizure 08/22/2006   Past Medical History:  Diagnosis Date   Allergy    allegic rhinitis   Anemia    Angiodysplasia of colon    Anxiety    Arthritis    AVM (arteriovenous malformation) of colon    Blood transfusion    Borderline diabetic    per patient medical history form   Breast cancer (HCC) 02/2009   breast CA L invasive ductal CA(hormone receptor positive.)   Cataract    Colon polyp    hyperplastic   Colon stricture (HCC) 01/14/2003   Diabetes mellitus    pt's medical doctor took her off diabetic meds-blood sugars have not been a problem--and medications were causing pt's sugars to be too low   Diverticulosis    Esophageal stricture 02/04/2008   Fatty liver    GERD (gastroesophageal reflux disease)    GI bleed    Gout    Hepatic steatosis    Hiatal hernia    Hyperlipidemia    Hypertension    Hypothyroidism    IBS (irritable bowel syndrome)    Macular edema    left eye   Melena 01/2008   anemia transfusion   Menopause    per patient medical history form   Osteoporosis    Postmenopausal hormone therapy    per patient medical history form.   RLS (restless legs syndrome)    Salmonella 05/2000   renal effects secondary to dehydration   Seizure disorder (HCC)    Seizures (HCC)    one seizure several yrs ago --etiology unknown-no problem since   Vitamin B 12 deficiency    Past Surgical History:  Procedure Laterality Date   ABDOMINAL HYSTERECTOMY  1983   BREAST SURGERY  02/2009   breast biopsy invasive ductal CA-bilateral mastectomies   EPIGASTRIC HERNIA REPAIR  01/02/2012    Procedure: HERNIA REPAIR EPIGASTRIC ADULT;  Surgeon: [name], MD;  Location: [facility];  Service: General;  Laterality: N/A;  Laparoscopic Repair Paraesophageal Hernia, Nissen   EYE SURGERY Left 07/2017   Dr. Elwyn Lade CYST EXCISION     LAPAROSCOPIC NISSEN FUNDOPLICATION  01/02/2012   Procedure: LAPAROSCOPIC NISSEN FUNDOPLICATION;  Surgeon: [name], MD;  Location: [facility];  Service: General;  Laterality: N/A;   MASTECTOMY  04/2009   Bilateral for ductal carcinoma on L   OVARY SURGERY  1986   ovarian tumor  removed   TUBAL LIGATION     Social History   Tobacco Use   Smoking status: Never   Smokeless tobacco: Never  Vaping Use   Vaping status: Never Used  Substance Use Topics   Alcohol use: Yes    Comment: occasional   Drug use: Never   Family History  Problem Relation Age of Onset   Hypertension Mother    Stroke Mother    Breast cancer Mother    Heart attack Father    Diabetes Father    Esophageal cancer Father    Breast cancer Sister        x 2 sisters   Diabetes Sister    Hypertension Sister    Breast cancer Sister    Hypertension Brother    Diabetes Brother    Allergies  Allergen Reactions   Arimidex [Anastrozole] Other (See Comments)    Joint pain   Glipizide Other (See Comments)    Hypoglycemia    Metformin And Related Other (See Comments)    diarrhea   Penicillins Hives and Rash   Current Outpatient Medications on File Prior to Visit  Medication Sig Dispense Refill   Alpha-Lipoic Acid 600 MG TABS Take 600 mg by mouth 2 (two) times daily. 180 tablet 3   amLODipine (NORVASC) 5 MG tablet TAKE 1 TABLET DAILY 90 tablet 2   Calcium Carbonate (CALCIUM 500 PO) Take 1 capsule by mouth daily.     Cholecalciferol (VITAMIN D3) 1000 units CAPS Take 1 capsule by mouth daily.     Cyanocobalamin (B-12) 1000 MCG TABS Take 1 tablet by mouth daily.     Dulaglutide (TRULICITY) 3 MG/0.5ML SOPN INJECT 0.5 ML (3 MG) UNDER THE SKIN ONCE A WEEK 6 mL 3    empagliflozin (JARDIANCE) 25 MG TABS tablet Take 1 tablet (25 mg total) by mouth daily before breakfast. 90 tablet 3   ezetimibe (ZETIA) 10 MG tablet TAKE 1 TABLET DAILY 90 tablet 3   fluticasone (FLONASE) 50 MCG/ACT nasal spray USE 2 SPRAYS IN EACH NOSTRIL DAILY AS NEEDED FOR ALLERGIES 48 g 0   hydrochlorothiazide (HYDRODIURIL) 25 MG tablet TAKE 1 TABLET DAILY 90 tablet 2   levothyroxine (SYNTHROID) 50 MCG tablet TAKE 1 TABLET DAILY BEFORE BREAKFAST 90 tablet 1   losartan (COZAAR) 100 MG tablet TAKE 1 TABLET DAILY 90 tablet 2   Multiple Vitamin (MULTIVITAMIN WITH MINERALS) TABS tablet Take 1 tablet by mouth daily.     omeprazole (PRILOSEC) 20 MG capsule TAKE 1 CAPSULE DAILY 90 capsule 2   repaglinide (PRANDIN) 1 MG tablet TAKE 1 TABLET DAILY BEFORE SUPPER 90 tablet 3   rOPINIRole (REQUIP) 1 MG tablet TAKE 1 TABLET AT BEDTIME 90 tablet 3   rosuvastatin (CRESTOR) 10 MG tablet TAKE 1 TABLET TWICE A WEEK 24 tablet 2   sertraline (ZOLOFT) 100 MG tablet TAKE 1 TABLET AT BEDTIME 90 tablet 1   No current facility-administered medications on file prior to visit.    Review of Systems  Constitutional:  Positive for fatigue. Negative for activity change, appetite change, fever and unexpected weight change.  HENT:  Negative for congestion, ear pain, postnasal drip, rhinorrhea, sinus pressure, sneezing and sore throat.   Eyes:  Negative for pain, redness and visual disturbance.  Respiratory:  Positive for shortness of breath. Negative for cough and wheezing.   Cardiovascular:  Positive for chest pain. Negative for palpitations and leg swelling.  Gastrointestinal:  Negative for abdominal pain, blood in stool, constipation and diarrhea.  Endocrine: Negative  for polydipsia and polyuria.  Genitourinary:  Negative for dysuria, frequency and urgency.  Musculoskeletal:  Negative for arthralgias, back pain and myalgias.  Skin:  Negative for pallor and rash.  Allergic/Immunologic: Negative for environmental  allergies.  Neurological:  Positive for light-headedness. Negative for dizziness, syncope, numbness and headaches.       Generalized weakness/not focal    Hematological:  Negative for adenopathy. Does not bruise/bleed easily.  Psychiatric/Behavioral:  Negative for decreased concentration and dysphoric mood. The patient is not nervous/anxious.        Objective:   Physical Exam Constitutional:      General: She is not in acute distress.    Appearance: Normal appearance. She is well-developed and normal weight. She is not ill-appearing or diaphoretic.     Comments: Mildly frail appearing   HENT:     Head: Normocephalic and atraumatic.     Right Ear: Tympanic membrane and ear canal normal.     Left Ear: Tympanic membrane and ear canal normal.     Mouth/Throat:     Mouth: Mucous membranes are moist.  Eyes:     General: No scleral icterus.       Right eye: No discharge.        Left eye: No discharge.     Conjunctiva/sclera: Conjunctivae normal.     Pupils: Pupils are equal, round, and reactive to light.  Neck:     Thyroid: No thyromegaly.     Vascular: No carotid bruit or JVD.  Cardiovascular:     Rate and Rhythm: Normal rate and regular rhythm.     Heart sounds: Normal heart sounds.     No gallop.  Pulmonary:     Effort: Pulmonary effort is normal. No respiratory distress.     Breath sounds: Normal breath sounds. No wheezing or rales.  Abdominal:     General: There is no distension or abdominal bruit.     Palpations: Abdomen is soft.  Musculoskeletal:     Cervical back: Normal range of motion and neck supple.     Right lower leg: No edema.     Left lower leg: No edema.  Lymphadenopathy:     Cervical: No cervical adenopathy.  Skin:    General: Skin is warm and dry.     Coloration: Skin is not pale.     Findings: No rash.  Neurological:     Mental Status: She is alert.     Cranial Nerves: Cranial nerves 2-12 are intact. No cranial nerve deficit, dysarthria or facial  asymmetry.     Sensory: Sensation is intact.     Motor: No tremor, atrophy, abnormal muscle tone or seizure activity.     Coordination: Coordination normal. Finger-Nose-Finger Test normal.     Deep Tendon Reflexes: Reflexes are normal and symmetric. Reflexes normal.     Comments: Balance is generally poor   Gait is slow and steady after standing for a moment   Psychiatric:        Mood and Affect: Mood normal.           Assessment & Plan:   Problem List Items Addressed This Visit       Cardiovascular and Mediastinum   Essential hypertension    bp in fair control at this time  BP Readings from Last 1 Encounters:  12/29/22 120/62   No changes needed Most recent labs reviewed  Disc lifstyle change with low sodium diet and exercise  Hctz 25 mg daily  Losartan 100 mg  daily  Amlodipine 5 mg daily       Relevant Orders   Ambulatory referral to Cardiology     Respiratory   Diaphragmatic hernia    May be adding to cp  On ppi Will add pepcid 20 mg bid for a month and follow up           Digestive   Diabetic gastroparesis (HCC)    Trulicity may be worsening this- nausea/abd and chest pain  Will hold it 1-2 weeks to see if improvement  Encouraged to touch base with endocrinology        Other   Chest pain - Primary    I do suspect HH and gastroparesis play a role  Also would like her to see cardiology to discuss vascular risk factors   Reviewed hospital records, lab results and studies in detail  Reassuring exam today   Instructed to hold trulicity to see if this helps Also sent in pepcid  Continue ppi   Call back and Er precautions noted in detail today        Relevant Orders   Ambulatory referral to Cardiology   Dizziness    With loss of balance Reviewed hospital records, lab results and studies in detail  MRI brain is reassuring  Unsure if associated with feeling of weakness/less po intake / gastroparesis  Plan follow up 1 month  Encouraged  hydratoin if able  Reassuring exam today       Relevant Orders   Ambulatory referral to Cardiology   Stress reaction    Husband with mental health issues   Reviewed stressors/ coping techniques/symptoms/ support sources/ tx options and side effects in detail today Encouraged good self care  May be exac her physical symptoms  Taking sertraline Will discussed further at f/u      Other Visit Diagnoses     Need for influenza vaccination       Relevant Orders   Flu Vaccine Trivalent High Dose (Fluad) (Completed)

## 2023-01-02 DIAGNOSIS — M1990 Unspecified osteoarthritis, unspecified site: Secondary | ICD-10-CM | POA: Insufficient documentation

## 2023-01-02 DIAGNOSIS — Z79890 Hormone replacement therapy (postmenopausal): Secondary | ICD-10-CM | POA: Insufficient documentation

## 2023-01-02 DIAGNOSIS — K5790 Diverticulosis of intestine, part unspecified, without perforation or abscess without bleeding: Secondary | ICD-10-CM | POA: Insufficient documentation

## 2023-01-02 DIAGNOSIS — I10 Essential (primary) hypertension: Secondary | ICD-10-CM | POA: Insufficient documentation

## 2023-01-02 DIAGNOSIS — E785 Hyperlipidemia, unspecified: Secondary | ICD-10-CM | POA: Insufficient documentation

## 2023-01-02 DIAGNOSIS — R7303 Prediabetes: Secondary | ICD-10-CM | POA: Insufficient documentation

## 2023-01-02 DIAGNOSIS — K635 Polyp of colon: Secondary | ICD-10-CM | POA: Insufficient documentation

## 2023-01-02 DIAGNOSIS — M170 Bilateral primary osteoarthritis of knee: Secondary | ICD-10-CM | POA: Insufficient documentation

## 2023-01-02 DIAGNOSIS — G40909 Epilepsy, unspecified, not intractable, without status epilepticus: Secondary | ICD-10-CM | POA: Insufficient documentation

## 2023-01-02 DIAGNOSIS — K449 Diaphragmatic hernia without obstruction or gangrene: Secondary | ICD-10-CM | POA: Insufficient documentation

## 2023-01-02 DIAGNOSIS — K5520 Angiodysplasia of colon without hemorrhage: Secondary | ICD-10-CM | POA: Insufficient documentation

## 2023-01-02 DIAGNOSIS — K760 Fatty (change of) liver, not elsewhere classified: Secondary | ICD-10-CM | POA: Insufficient documentation

## 2023-01-02 DIAGNOSIS — Z780 Asymptomatic menopausal state: Secondary | ICD-10-CM | POA: Insufficient documentation

## 2023-01-02 DIAGNOSIS — R569 Unspecified convulsions: Secondary | ICD-10-CM | POA: Insufficient documentation

## 2023-01-02 DIAGNOSIS — F419 Anxiety disorder, unspecified: Secondary | ICD-10-CM | POA: Insufficient documentation

## 2023-01-02 DIAGNOSIS — T7840XA Allergy, unspecified, initial encounter: Secondary | ICD-10-CM | POA: Insufficient documentation

## 2023-01-02 DIAGNOSIS — H3581 Retinal edema: Secondary | ICD-10-CM | POA: Insufficient documentation

## 2023-01-02 DIAGNOSIS — K922 Gastrointestinal hemorrhage, unspecified: Secondary | ICD-10-CM | POA: Insufficient documentation

## 2023-01-04 ENCOUNTER — Ambulatory Visit: Payer: Medicare HMO

## 2023-01-04 VITALS — BP 108/68 | HR 92 | Ht 62.75 in | Wt 145.2 lb

## 2023-01-04 DIAGNOSIS — R0609 Other forms of dyspnea: Secondary | ICD-10-CM

## 2023-01-04 DIAGNOSIS — E785 Hyperlipidemia, unspecified: Secondary | ICD-10-CM

## 2023-01-04 DIAGNOSIS — R072 Precordial pain: Secondary | ICD-10-CM | POA: Diagnosis not present

## 2023-01-04 DIAGNOSIS — I10 Essential (primary) hypertension: Secondary | ICD-10-CM

## 2023-01-04 DIAGNOSIS — R002 Palpitations: Secondary | ICD-10-CM

## 2023-01-04 HISTORY — DX: Palpitations: R00.2

## 2023-01-04 HISTORY — DX: Other forms of dyspnea: R06.09

## 2023-01-04 MED ORDER — METOPROLOL TARTRATE 50 MG PO TABS: ORAL_TABLET | ORAL | 0 refills | Status: DC

## 2023-01-04 MED ORDER — AMLODIPINE BESYLATE 2.5 MG PO TABS: 2.5000 mg | ORAL_TABLET | Freq: Every day | ORAL | 3 refills | Status: DC

## 2023-01-04 NOTE — Assessment & Plan Note (Signed)
Last lipid panel from February 2024 with direct LDL 55, total cholesterol 161, HDL 33 Continue rosuvastatin 10 mg once daily and Zetia 10 mg once daily.

## 2023-01-04 NOTE — Assessment & Plan Note (Signed)
Atypical symptoms of chest pain, appears noncardiac and sounds secondary to hiatal hernia with symptoms notably on deep inspiration.  She does have progressive symptoms of dyspnea on exertion which could be related to cardiovascular factors and will workup as above.  Agree with PCPs plan for optimizing GERD treatment with PPIs and Pepcid.

## 2023-01-04 NOTE — Assessment & Plan Note (Signed)
Progressive symptoms, concerning for angina given her cardiovascular risk factors.  Will obtain transthoracic echocardiogram for cardiac structure and function assessment.  Reviewed further assessment for underlying coronary artery disease.  In the setting of limited functional capacity and baseline EKG changes and hiatal hernia, poor candidate for assessment Treadmill EKG on pharmacological stress test with imaging studies. Will proceed with CTA coronary angiogram. She denies any allergies to IV dye Metoprolol tartrate 50 mg dose on the night before and on the morning of the test to optimize heart rates. Hold amlodipine dose on the day before and on the morning of the test, to avoid hypotension.

## 2023-01-04 NOTE — Patient Instructions (Signed)
Medication Instructions:  Your physician has recommended you make the following change in your medication:   START: Amlodipine 2.5 mg daily  *If you need a refill on your cardiac medications before your next appointment, please call your pharmacy*   Lab Work: Your physician recommends that you return for lab work in:   Labs today: BMP  If you have labs (blood work) drawn today and your tests are completely normal, you will receive your results only by: MyChart Message (if you have MyChart) OR A paper copy in the mail If you have any lab test that is abnormal or we need to change your treatment, we will call you to review the results.   Testing/Procedures: Your physician has requested that you have an echocardiogram. Echocardiography is a painless test that uses sound waves to create images of your heart. It provides your doctor with information about the size and shape of your heart and how well your heart's chambers and valves are working. This procedure takes approximately one hour. There are no restrictions for this procedure. Please do NOT wear cologne, perfume, aftershave, or lotions (deodorant is allowed). Please arrive 15 minutes prior to your appointment time.  A zio monitor was ordered today. It will remain on for 7 days. You will then return monitor and event diary in provided box. It takes 1-2 weeks for report to be downloaded and returned to [place]. We will call you with the results. If monitor falls off or has orange flashing light, please call [employer] for further instructions.     Your cardiac CT will be scheduled at one of the below locations:   [facility] [address] [place], [place] [address] [phone]  OR  [facility] [address] [place], [place] [address] [phone]  OR   [facility] [address] [place], [place] [address] [phone]  If scheduled at [facility], please arrive at the [facility] and Children's Entrance (Entrance C2) of [facility] 30 minutes prior to test start time. You can use the FREE valet parking offered at entrance C (encouraged to control the heart rate for the test)  Proceed to the [facility] Radiology Department (first floor) to check-in and test prep.  All radiology patients and guests should use entrance C2 at [place], accessed from [facility], even though the hospital's physical address listed is [address].    If scheduled at [facility] or [facility], please arrive 15 mins early for check-in and test prep.  There is spacious parking and easy access to the radiology department from the [facility] Heart and Vascular entrance. Please enter here and check-in with the desk attendant.   Please follow these instructions carefully (unless otherwise directed):  An IV will be required for this test and Nitroglycerin will be given.  Hold all erectile dysfunction medications at least 3 days (72 hrs) prior to test. (Ie viagra, cialis, sildenafil, tadalafil, etc)   On the Night Before the Test: Be sure to Drink plenty of water. Do not consume any caffeinated/decaffeinated beverages or chocolate 12 hours prior to your test. Do not take any antihistamines 12 hours prior to your test.  On the Day of the Test: Drink plenty of water until 1 hour prior to the test. Do not eat any food 1 hour prior to test. You may take your regular medications prior to the test.  Take metoprolol (Lopressor)  50 mg the night before your CT and 50 mg the morning of your CT Hold Amlodipine the day before  and the morning of your CT If you take Hydrochlorothiazide please HOLD on the morning of the test. FEMALES- please wear underwire-free bra if available, avoid dresses & tight clothing      After the Test: Drink plenty of water. After receiving IV  contrast, you may experience a mild flushed feeling. This is normal. On occasion, you may experience a mild rash up to 24 hours after the test. This is not dangerous. If this occurs, you can take Benadryl 25 mg and increase your fluid intake. If you experience trouble breathing, this can be serious. If it is severe call 911 IMMEDIATELY. If it is mild, please call our office. If you take any of these medications: Glipizide/Metformin, Avandament, Glucavance, please do not take 48 hours after completing test unless otherwise instructed.  We will call to schedule your test 2-4 weeks out understanding that some insurance companies will need an authorization prior to the service being performed.   For more information and frequently asked questions, please visit our website : [email or web address]  For non-scheduling related questions, please contact the cardiac imaging nurse navigator should you have any questions/concerns: Cardiac Imaging Nurse Navigators Direct Office Dial: [phone]   For scheduling needs, including cancellations and rescheduling, please call [place], [phone].   Follow-Up: At [facility], you and your health needs are our priority.  As part of our continuing mission to provide you with exceptional heart care, we have created designated Provider Care Teams.  These Care Teams include your primary Cardiologist (physician) and Advanced Practice Providers (APPs -  Physician Assistants and Nurse Practitioners) who all work together to provide you with the care you need, when you need it.  We recommend signing up for the patient portal called "MyChart".  Sign up information is provided on this After Visit Summary.  MyChart is used to connect with patients for Virtual Visits (Telemedicine).  Patients are able to view lab/test results, encounter notes, upcoming appointments, etc.  Non-urgent messages can be sent to your provider as well.   To learn more about  what you can do with MyChart, go to [email or web address].    Your next appointment:   1 month(s)  Provider:   Huntley Dec, MD    Other Instructions None

## 2023-01-04 NOTE — Progress Notes (Signed)
Cardiology Consultation:    Date:  01/04/2023   ID:  Valerie Ball, DOB February 17, 1946, MRN [id number]  PCP:  Valerie Pimple, MD  Cardiologist:  Valerie Corporal Mareo Portilla, MD   Referring MD: Valerie Pimple, MD   Dyspnea on exertion, dizziness and atypical chest pain   ASSESSMENT AND PLAN:   Valerie Ball a 77 year old woman with history of diabetes mellitus, hypertension, hyperlipidemia, hypothyroidism, irritable bowel syndrome, hiatal hernia s/p repair that was unsuccessful 10 years ago, seizure disorder, chronic thrombocytopenia, gastroparesis, nonalcoholic steatohepatitis, with chronic dizziness symptoms and atypical chest pain now with progressive dyspnea on exertion symptoms  Problem List Items Addressed This Visit     Chest pain    Atypical symptoms of chest pain, appears noncardiac and sounds secondary to hiatal hernia with symptoms notably on deep inspiration.  She does have progressive symptoms of dyspnea on exertion which could be related to cardiovascular factors and will workup as above.  Agree with PCPs plan for optimizing GERD treatment with PPIs and Pepcid.       Relevant Orders   LONG TERM MONITOR (3-14 DAYS)   CT CORONARY MORPH W/CTA COR W/SCORE W/CA W/CM &/OR WO/CM   ECHOCARDIOGRAM COMPLETE   Basic Metabolic Panel (BMET)   Hyperlipidemia - Primary    Last lipid panel from February 2024 with direct LDL 55, total cholesterol 956, HDL 33 Continue rosuvastatin 10 mg once daily and Zetia 10 mg once daily.      Relevant Medications   amLODipine (NORVASC) 2.5 MG tablet   metoprolol tartrate (LOPRESSOR) 50 MG tablet   Other Relevant Orders   EKG 12-Lead (Completed)   LONG TERM MONITOR (3-14 DAYS)   ECHOCARDIOGRAM COMPLETE   Basic Metabolic Panel (BMET)   Hypertension    Blood pressure well-controlled, in fact runs on the lower end. Target below 130 over 80 mmHg. Given her age she is already on multiple medications for blood pressure control, will titrate down the  dose of amlodipine to 2.5 mg once daily. If blood pressures remain well-controlled amlodipine can be discontinued entirely.       Relevant Medications   amLODipine (NORVASC) 2.5 MG tablet   metoprolol tartrate (LOPRESSOR) 50 MG tablet   Dyspnea on exertion    Progressive symptoms, concerning for angina given her cardiovascular risk factors.  Will obtain transthoracic echocardiogram for cardiac structure and function assessment.  Reviewed further assessment for underlying coronary artery disease.  In the setting of limited functional capacity and baseline EKG changes and hiatal hernia, poor candidate for assessment Treadmill EKG on pharmacological stress test with imaging studies. Will proceed with CTA coronary angiogram. She denies any allergies to IV dye Metoprolol tartrate 50 mg dose on the night before and on the morning of the test to optimize heart rates. Hold amlodipine dose on the day before and on the morning of the test, to avoid hypotension.       Palpitations    Infrequent.  Will evaluate with Zio patch for 7 days.      Return to clinic in 1 month.   History of Present Illness:    Valerie Ball is a 77 y.o. female who is being seen today for the evaluation of intermittent chest pain, shortness of breath and dizziness at the request of Tower, Audrie Gallus, MD.  H/o diabetes mellitus, hypertension, hyperlipidemia, hypothyroidism, irritable bowel syndrome, gastroparesis, nonalcoholic steatohepatitis, hiatal hernia s/p surgery over 10 years ago, seizure disorder, chronic thrombocytopenia.  Was an occasional alcohol drinker that  she quit a year ago after finding out about fatty liver. Denies any prior known cardiac history.  Was recently at emergency room 12-11-2022 for symptoms of dizziness and intermittent chest pain associated with shortness of breath.  Blood work was unremarkable.  CT of the head was significant for extensive chronic microvascular ischemic changes.  MRI  of the brain shows advanced chronic microvascular ischemic changes of the white matter, no acute stroke.  Mentions of dizziness symptoms are chronic, going on for years, and her chest discomfort is typically described as a sensation of sharp ache in the lower part of the chest substernal and upper epigastric region mostly on deep inspiration.  Otherwise no exertional symptoms of dizziness or chest discomfort.  With these symptoms when she called triage, she was asked to go to the ER.  The workup at the ER was as above.  She mentions overall shortness of breath with minimal exertion at home has been slowly progressing over the years.  The most she can do is walk 20 feet before she has to sit down.  Even washing utensils in her kitchen she gets tired and has to go sit down.  Denies any symptoms at rest.  She does report exercising using under the desk pedal called Ellipse and able to do so without significant symptoms but this appears to be a low exertional activity.  Mentions blood pressures at home well-controlled typically on the lower side of normal.  Reports occasional palpitations associated with dizziness symptoms, sensation of an extra beat and a skipped beat.   No lightheadedness, syncopal episodes.  Denies any falls. No blood in urine.   Does have hemorrhoids which at times causes bleeding.  Quit drinking alcohol about a year ago. Does not smoke.  EKG in the clinic today shows sinus rhythm heart rate 92/min, inferior deep narrow Q waves, likely nonpathological.  Anteroseptal Q waves.  Normal QRS duration and QTc.  No significant change compared to prior EKG from 07-01-2015.  High-sensitivity troponin I unremarkable 43 4 D-dimer, unremarkable less than 0.27 Sodium 139, potassium 3.6, BUN 18, creatinine 1.2, EGFR 47 Normal transaminases and alkaline phosphatase Lipase 38 Hemoglobin 14.5, hematocrit 46, WBC 6.1, platelets 138  Last lipid panel from May 04, 2022 shows direct LDL  55 Triglycerides 209, HDL 33, total cholesterol 161 TSH 4.03  Past Medical History:  Diagnosis Date   Allergic rhinitis 08/22/2006   Qualifier: Diagnosis of   By: Lindwood Qua CMA, Jerl Santos      IMO SNOMED Dx Update Oct 2024     Allergy    allegic rhinitis   Alternating constipation and diarrhea 08/12/2015   Anemia    Angiodysplasia of colon    Anxiety    Arthritis    AVM (arteriovenous malformation) of colon    Bilateral knee pain 05/11/2022   Blurred vision, right eye 08/11/2021   Patient does have blurred vision right eye,     Borderline diabetic    per patient medical history form   Branch retinal vein occlusion with macular edema of left eye 07/14/2019   CME superior to the fovea OS has worsened at 7-week interval from macular branch retinal vein occlusion, repeat Avastin today and evaluate exam in 6 weeks     Breast cancer (HCC) 02/2009   breast CA L invasive ductal CA(hormone receptor positive.)   Chest pain 12/29/2022   Colon cancer screening 01/23/2014   Colon polyp    hyperplastic   Colon stricture (HCC) 01/14/2003   Cystoid  macular edema of left eye 07/14/2019   Decreased calculated GFR 05/11/2022   Diabetic gastroparesis (HCC) 03/27/2016   Typical symptoms -suspect      No detectable diabetic retinopathy 12-27-2020     Diabetic neuropathy (HCC) 11/25/2013   Diaphragmatic hernia 03/02/2008   Qualifier: Diagnosis of   By: Koleen Distance CMA Duncan Dull), Hulan Saas      IMO SNOMED Dx Update Oct 2024     Diverticulitis of colon 10/14/2014   Diverticulosis    Dizziness 01/07/2014   Encounter for Medicare annual wellness exam 01/23/2014   ESOPHAGEAL STRICTURE 03/02/2008   Qualifier: Diagnosis of   By: Koleen Distance CMA (AAMA), Hulan Saas         Essential hypertension 08/22/2006   Qualifier: Diagnosis of   By: Lindwood Qua CMA, Jerl Santos         Estrogen deficiency 01/23/2014   Fatigue 05/20/2018   Fatty liver    Fever, low grade 09/24/2020   GAD (generalized anxiety disorder) 04/15/2007    Qualifier: Diagnosis of   By: Jillyn Hidden FNP, Mcarthur Rossetti        GI bleed    Gout    Hepatic steatosis    Hiatal hernia    Hip pain, bilateral 08/31/2017   History of breast cancer 08/02/2009   Qualifier: Diagnosis of   By: [name] MD, Colon Flattery        History of COVID-19 09/24/2020   History of removal of implants of both breasts 07/01/2013   06/25/13 elected to have bilateral breast implants removed.     History of seizure 08/22/2006   Qualifier: Diagnosis of   By: Lindwood Qua CMA, Jerl Santos         Hyperlipidemia    Hyperlipidemia associated with type 2 diabetes mellitus (HCC) 08/22/2006   Qualifier: Diagnosis of   By: Lindwood Qua CMA, Jerl Santos         Hypertension    Hypertensive retinopathy of left eye 07/14/2019   Hypothyroidism    IBS (irritable bowel syndrome)    Lap Nissen October 2013 01/19/2012   Laparoscopic takedown of large type III mixed hiatus hernia with primary closure of the diaphragm with 3 sutures posteriorally (post 2 with pledgets) and Nissen fundoplication over a #56 lighted bougie        Large type III mixed hiatus hernia  12/06/2011   Plan surgery to repair diaphragm and do Nissen fundoplication     Macular edema    left eye   Melena 01/2008   anemia transfusion   Menopause    per patient medical history form   Peritoneal adhesions 03/02/2008   Qualifier: Diagnosis of   By: Koleen Distance CMA Duncan Dull), Leisha      IMO SNOMED Dx Update Oct 2024     Poorly controlled type 2 diabetes mellitus with neuropathy (HCC) 08/19/2007   Qualifier: Diagnosis of   By: [name] MD, Colon Flattery        Posterior vitreous detachment of right eye 11/06/2019   Postmenopausal hormone therapy    per patient medical history form.   Retinal vein occlusion, branch 01/03/2016   Left with hemorrhage and edema (Dr Luciana Axe) 2017     Routine general medical examination at a health care facility 07/11/2011   Salmonella 05/2000   renal effects secondary to dehydration   Seizure disorder (HCC)     Seizures (HCC)    one seizure several yrs ago --etiology unknown-no problem since   Stress reaction 10/25/2011   SYNDROME, CARPAL TUNNEL 08/23/2006   Qualifier: Diagnosis  of   By: [name] MD, Colon Flattery        SYNDROME, RESTLESS LEGS 08/23/2006   Qualifier: Diagnosis of   By: [name] MD, Colon Flattery        Thrombocytopenia (HCC) 11/25/2013   TRANSAMINASES, SERUM, ELEVATED 11/11/2009   Qualifier: Diagnosis of   By: [name] MD, Colon Flattery        Tremor 01/20/2014   Head and right hand      Vitamin B12 deficiency 09/21/2016   Vitreous floaters of left eye 07/14/2019   The nature of vitreous floaters was discussed with the patient as well as their frequent occurrence with development of posterior vitreous detachment. The significance of flashes and field cuts was discussed with the patient. The need for a dilated fundus examination was discussed and advice given to return immediately for new or different floaters .  More uncommonly, vitreous floaters, debris, or    Past Surgical History:  Procedure Laterality Date   ABDOMINAL HYSTERECTOMY  1983   BREAST SURGERY  02/2009   breast biopsy invasive ductal CA-bilateral mastectomies   EPIGASTRIC HERNIA REPAIR  01/02/2012   Procedure: HERNIA REPAIR EPIGASTRIC ADULT;  Surgeon: [name], MD;  Location: [facility];  Service: General;  Laterality: N/A;  Laparoscopic Repair Paraesophageal Hernia, Nissen   EYE SURGERY Left 07/2017   Dr. Elwyn Lade CYST EXCISION     LAPAROSCOPIC NISSEN FUNDOPLICATION  01/02/2012   Procedure: LAPAROSCOPIC NISSEN FUNDOPLICATION;  Surgeon: [name], MD;  Location: [facility];  Service: General;  Laterality: N/A;   MASTECTOMY  04/2009   Bilateral for ductal carcinoma on L   OVARY SURGERY  1986   ovarian tumor removed   TUBAL LIGATION      Current Medications: Current Meds  Medication Sig   Alpha-Lipoic Acid 600 MG TABS Take 600 mg by mouth 2 (two) times daily.   amLODipine (NORVASC) 2.5 MG tablet Take 1 tablet  (2.5 mg total) by mouth daily.   Calcium Carbonate (CALCIUM 500 PO) Take 1 capsule by mouth daily.   Cholecalciferol (VITAMIN D3) 1000 units CAPS Take 1 capsule by mouth daily.   Cyanocobalamin (B-12) 1000 MCG TABS Take 1 tablet by mouth daily.   Dulaglutide (TRULICITY) 3 MG/0.5ML SOPN INJECT 0.5 ML (3 MG) UNDER THE SKIN ONCE A WEEK   empagliflozin (JARDIANCE) 25 MG TABS tablet Take 1 tablet (25 mg total) by mouth daily before breakfast.   ezetimibe (ZETIA) 10 MG tablet TAKE 1 TABLET DAILY   famotidine (PEPCID) 20 MG tablet Take 1 tablet (20 mg total) by mouth 2 (two) times daily.   fluticasone (FLONASE) 50 MCG/ACT nasal spray USE 2 SPRAYS IN EACH NOSTRIL DAILY AS NEEDED FOR ALLERGIES   hydrochlorothiazide (HYDRODIURIL) 25 MG tablet TAKE 1 TABLET DAILY   levothyroxine (SYNTHROID) 50 MCG tablet TAKE 1 TABLET DAILY BEFORE BREAKFAST   losartan (COZAAR) 100 MG tablet TAKE 1 TABLET DAILY   metoprolol tartrate (LOPRESSOR) 50 MG tablet Please take 50 mg ( 1 tablet) the night before and 50 mg ( 1 tablet) the morning of your CT   Multiple Vitamin (MULTIVITAMIN WITH MINERALS) TABS tablet Take 1 tablet by mouth daily.   omeprazole (PRILOSEC) 20 MG capsule TAKE 1 CAPSULE DAILY   repaglinide (PRANDIN) 1 MG tablet TAKE 1 TABLET DAILY BEFORE SUPPER   rOPINIRole (REQUIP) 1 MG tablet TAKE 1 TABLET AT BEDTIME   rosuvastatin (CRESTOR) 10 MG tablet TAKE 1 TABLET TWICE A WEEK   sertraline (ZOLOFT) 100 MG  tablet TAKE 1 TABLET AT BEDTIME   [DISCONTINUED] amLODipine (NORVASC) 5 MG tablet TAKE 1 TABLET DAILY     Allergies:   Arimidex [anastrozole], Glipizide, Metformin and related, and Penicillins   Social History   Socioeconomic History   Marital status: Married    Spouse name: Not on file   Number of children: 3   Years of education: Not on file   Highest education level: Not on file  Occupational History   Occupation: retired    Associate Professor: UNEMPLOYED  Tobacco Use   Smoking status: Never   Smokeless  tobacco: Never  Vaping Use   Vaping status: Never Used  Substance and Sexual Activity   Alcohol use: Yes    Comment: occasional   Drug use: Never   Sexual activity: Not Currently  Other Topics Concern   Not on file  Social History Narrative   ** Merged History Encounter **       1 cup of coffee every morning   Social Determinants of Health   Financial Resource Strain: Medium Risk (03/10/2022)   Overall Financial Resource Strain (CARDIA)    Difficulty of Paying Living Expenses: Somewhat hard  Food Insecurity: No Food Insecurity (03/10/2022)   Hunger Vital Sign    Worried About Running Out of Food in the Last Year: Never true    Ran Out of Food in the Last Year: Never true  Transportation Needs: No Transportation Needs (03/10/2022)   PRAPARE - Administrator, Civil Service (Medical): No    Lack of Transportation (Non-Medical): No  Physical Activity: Inactive (03/10/2022)   Exercise Vital Sign    Days of Exercise per Week: 0 days    Minutes of Exercise per Session: 0 min  Stress: Stress Concern Present (03/10/2022)   [employer] of Occupational Health - Occupational Stress Questionnaire    Feeling of Stress : Rather much  Social Connections: Unknown (03/10/2022)   Social Connection and Isolation Panel [NHANES]    Frequency of Communication with Friends and Family: Not on file    Frequency of Social Gatherings with Friends and Family: Once a week    Attends Religious Services: Not on Insurance claims handler of Clubs or Organizations: Yes    Attends Banker Meetings: 1 to 4 times per year    Marital Status: Married     Family History: The patient's family history includes Breast cancer in her mother, sister, and sister; Diabetes in her brother, father, and sister; Esophageal cancer in her father; Heart attack in her father; Hypertension in her brother, mother, and sister; Stroke in her mother. ROS:   Please see the history of present illness.     All 14 point review of systems negative except as described per history of present illness.  EKGs/Labs/Other Studies Reviewed:    The following studies were reviewed today: EKG:  EKG Interpretation Date/Time:  Thursday January 04 2023 15:03:57 EDT Ventricular Rate:  92 PR Interval:  144 QRS Duration:  66 QT Interval:  388 QTC Calculation: 479 R Axis:   -8  Text Interpretation: Normal sinus rhythm Possible Inferior infarct (cited on or before 11-Dec-2022) Anterior infarct (cited on or before 11-Dec-2022) Abnormal ECG When compared with ECG of 11-Dec-2022 19:51, Nonspecific T wave abnormality no longer evident in Lateral leads Confirmed by Huntley Dec reddy [id number]) on 01/04/2023 3:31:01 PM    Recent Labs: 05/04/2022: TSH 4.03 12/11/2022: ALT 31; BUN 18; Creatinine, Ser 1.20; Hemoglobin 14.5; Platelets 138; Potassium 3.6;  Sodium 139  Recent Lipid Panel    Component Value Date/Time   CHOL 112 05/04/2022 0909   TRIG 209.0 (H) 05/04/2022 0909   HDL 33.10 (L) 05/04/2022 0909   CHOLHDL 3 05/04/2022 0909   VLDL 41.8 (H) 05/04/2022 0909   LDLCALC 52 05/04/2021 0901   LDLDIRECT 55.0 05/04/2022 0909    Physical Exam:    VS:  BP 108/68   Pulse 92   Ht 5' 2.75" (1.594 m)   Wt 145 lb 3.2 oz (65.9 kg)   LMP 03/20/1981   SpO2 94%   BMI 25.93 kg/m     Wt Readings from Last 3 Encounters:  01/04/23 145 lb 3.2 oz (65.9 kg)  12/29/22 145 lb 8 oz (66 kg)  11/02/22 149 lb (67.6 kg)     GENERAL:  Well nourished, well developed in no acute distress NECK: No JVD; No carotid bruits CARDIAC: RRR, S1 and S2 present, no murmurs, no rubs, no gallops CHEST:  Clear to auscultation without rales, wheezing or rhonchi  Extremities: No pitting pedal edema. Pulses bilaterally symmetric with radial 2+ and dorsalis pedis 2+ NEUROLOGIC:  Alert and oriented x 3  Medication Adjustments/Labs and Tests Ordered: Current medicines are reviewed at length with the patient today.  Concerns regarding  medicines are outlined above.  Orders Placed This Encounter  Procedures   CT CORONARY MORPH W/CTA COR W/SCORE W/CA W/CM &/OR WO/CM   Basic Metabolic Panel (BMET)   LONG TERM MONITOR (3-14 DAYS)   EKG 12-Lead   ECHOCARDIOGRAM COMPLETE   Meds ordered this encounter  Medications   amLODipine (NORVASC) 2.5 MG tablet    Sig: Take 1 tablet (2.5 mg total) by mouth daily.    Dispense:  90 tablet    Refill:  3   metoprolol tartrate (LOPRESSOR) 50 MG tablet    Sig: Please take 50 mg ( 1 tablet) the night before and 50 mg ( 1 tablet) the morning of your CT    Dispense:  2 tablet    Refill:  0    Signed, Roben Schliep reddy Aren Pryde, MD, MPH, [facility]. 01/04/2023 4:51 PM    [facility]

## 2023-01-04 NOTE — Assessment & Plan Note (Signed)
Infrequent.  Will evaluate with Zio patch for 7 days.

## 2023-01-04 NOTE — Assessment & Plan Note (Addendum)
Blood pressure well-controlled, in fact runs on the lower end. Target below 130 over 80 mmHg. Given her age she is already on multiple medications for blood pressure control, will titrate down the dose of amlodipine to 2.5 mg once daily. If blood pressures remain well-controlled amlodipine can be discontinued entirely.

## 2023-01-05 LAB — BASIC METABOLIC PANEL
BUN/Creatinine Ratio: 18 (ref 12–28)
BUN: 26 mg/dL (ref 8–27)
CO2: 20 mmol/L (ref 20–29)
Calcium: 9.4 mg/dL (ref 8.7–10.3)
Chloride: 112 mmol/L — ABNORMAL HIGH (ref 96–106)
Creatinine, Ser: 1.48 mg/dL — ABNORMAL HIGH (ref 0.57–1.00)
Glucose: 125 mg/dL — ABNORMAL HIGH (ref 70–99)
Potassium: 4.4 mmol/L (ref 3.5–5.2)
Sodium: 148 mmol/L — ABNORMAL HIGH (ref 134–144)
eGFR: 37 mL/min/{1.73_m2} — ABNORMAL LOW (ref >59)

## 2023-01-08 ENCOUNTER — Other Ambulatory Visit: Payer: Self-pay

## 2023-01-08 DIAGNOSIS — I10 Essential (primary) hypertension: Secondary | ICD-10-CM

## 2023-01-12 DIAGNOSIS — I10 Essential (primary) hypertension: Secondary | ICD-10-CM | POA: Diagnosis not present

## 2023-01-13 LAB — BASIC METABOLIC PANEL
BUN/Creatinine Ratio: 20 (ref 12–28)
BUN: 22 mg/dL (ref 8–27)
CO2: 24 mmol/L (ref 20–29)
Calcium: 9.5 mg/dL (ref 8.7–10.3)
Chloride: 106 mmol/L (ref 96–106)
Creatinine, Ser: 1.08 mg/dL — ABNORMAL HIGH (ref 0.57–1.00)
Glucose: 182 mg/dL — ABNORMAL HIGH (ref 70–99)
Potassium: 4.2 mmol/L (ref 3.5–5.2)
Sodium: 146 mmol/L — ABNORMAL HIGH (ref 134–144)
eGFR: 54 mL/min/{1.73_m2} — ABNORMAL LOW (ref >59)

## 2023-01-16 ENCOUNTER — Telehealth: Payer: Self-pay

## 2023-01-16 NOTE — Telephone Encounter (Signed)
Transition Care Management Unsuccessful Follow-up Telephone Call  Date of discharge and from where:  [facility]  Attempts:  1st Attempt  Reason for unsuccessful TCM follow-up call:  No answer/busy   Lenard Forth [place]  [facility], [facility] Guide, Phone: [phone] Website: [email or web address]

## 2023-01-17 ENCOUNTER — Telehealth: Payer: Self-pay

## 2023-01-17 MED ORDER — METOPROLOL TARTRATE 50 MG PO TABS: ORAL_TABLET | ORAL | 0 refills | Status: DC

## 2023-01-17 NOTE — Telephone Encounter (Signed)
RX resent

## 2023-01-17 NOTE — Telephone Encounter (Signed)
*  STAT* If patient is at the pharmacy, call can be transferred to refill team.   1. Which medications need to be refilled? (please list name of each medication and dose if known) metoprolol tartrate (LOPRESSOR) 50 MG tablet   2. Which pharmacy/location (including street and city if local pharmacy) is medication to be sent to? [id number] - [place], [place] - [address] Phone: [phone]  Fax: [phone]      3. Do they need a 30 day or 90 day supply? 2 Tablets Sent to wrong pharmacy the first time

## 2023-01-17 NOTE — Telephone Encounter (Signed)
Transition Care Management Unsuccessful Follow-up Telephone Call  Date of discharge and from where:  [facility]  Attempts:  2nd Attempt  Reason for unsuccessful TCM follow-up call:  No answer/busy   Lenard Forth [place]  [facility], [facility] Guide, Phone: [phone] Website: [email or web address]

## 2023-01-18 ENCOUNTER — Telehealth (HOSPITAL_COMMUNITY): Payer: Self-pay | Admitting: *Deleted

## 2023-01-18 DIAGNOSIS — R072 Precordial pain: Secondary | ICD-10-CM | POA: Diagnosis not present

## 2023-01-18 DIAGNOSIS — E785 Hyperlipidemia, unspecified: Secondary | ICD-10-CM | POA: Diagnosis not present

## 2023-01-18 NOTE — Telephone Encounter (Signed)
Attempted to call patient regarding upcoming cardiac CT appointment. Left message on voicemail with name and callback number Hayley Sharpe RN Navigator Cardiac Imaging [facility] [phone] Office   

## 2023-01-19 ENCOUNTER — Ambulatory Visit (HOSPITAL_COMMUNITY)
Admission: RE | Admit: 2023-01-19 | Discharge: 2023-01-19 | Disposition: A | Discharge status: Home / Self Care | Payer: Medicare HMO | Source: Ambulatory Visit | Attending: Cardiology | Admitting: Cardiology

## 2023-01-19 ENCOUNTER — Ambulatory Visit (HOSPITAL_COMMUNITY)
Admission: RE | Admit: 2023-01-19 | Discharge: 2023-01-19 | Disposition: A | Discharge status: Home / Self Care | Payer: Medicare HMO | Source: Ambulatory Visit

## 2023-01-19 ENCOUNTER — Other Ambulatory Visit: Payer: Self-pay | Admitting: Cardiology

## 2023-01-19 DIAGNOSIS — I2510 Atherosclerotic heart disease of native coronary artery without angina pectoris: Secondary | ICD-10-CM | POA: Diagnosis not present

## 2023-01-19 DIAGNOSIS — R931 Abnormal findings on diagnostic imaging of heart and coronary circulation: Secondary | ICD-10-CM | POA: Diagnosis not present

## 2023-01-19 DIAGNOSIS — R072 Precordial pain: Secondary | ICD-10-CM | POA: Insufficient documentation

## 2023-01-19 MED ORDER — IOHEXOL 350 MG/ML SOLN
100.0000 mL | Freq: Once | INTRAVENOUS | Status: AC | PRN
  Administered 2023-01-19: 100 mL via INTRAVENOUS

## 2023-01-19 MED ORDER — NITROGLYCERIN 0.4 MG SL SUBL
SUBLINGUAL_TABLET | SUBLINGUAL | Status: AC
  Filled 2023-01-19: qty 2

## 2023-01-19 MED ORDER — NITROGLYCERIN 0.4 MG SL SUBL
0.8000 mg | SUBLINGUAL_TABLET | Freq: Once | SUBLINGUAL | Status: AC
  Administered 2023-01-19: 0.8 mg via SUBLINGUAL

## 2023-01-20 DIAGNOSIS — R072 Precordial pain: Secondary | ICD-10-CM

## 2023-01-20 DIAGNOSIS — R0609 Other forms of dyspnea: Secondary | ICD-10-CM

## 2023-01-20 DIAGNOSIS — R931 Abnormal findings on diagnostic imaging of heart and coronary circulation: Secondary | ICD-10-CM

## 2023-01-22 NOTE — Addendum Note (Signed)
Addended by: Eleonore Chiquito on: 01/22/2023 09:25 AM   Modules accepted: Orders

## 2023-01-24 ENCOUNTER — Other Ambulatory Visit: Payer: Self-pay | Admitting: *Deleted

## 2023-01-24 MED ORDER — FAMOTIDINE 20 MG PO TABS: 20.0000 mg | ORAL_TABLET | Freq: Two times a day (BID) | ORAL | 1 refills | Status: DC

## 2023-01-25 ENCOUNTER — Ambulatory Visit: Payer: Medicare HMO

## 2023-01-25 ENCOUNTER — Other Ambulatory Visit: Payer: Self-pay | Admitting: Family Medicine

## 2023-01-25 DIAGNOSIS — E785 Hyperlipidemia, unspecified: Secondary | ICD-10-CM | POA: Diagnosis not present

## 2023-01-25 DIAGNOSIS — R072 Precordial pain: Secondary | ICD-10-CM | POA: Diagnosis not present

## 2023-01-25 LAB — ECHOCARDIOGRAM COMPLETE
Area-P 1/2: 2.18 cm2
MV VTI: 1.38 cm2
P 1/2 time: 519 ms
S' Lateral: 2.2 cm

## 2023-01-26 ENCOUNTER — Other Ambulatory Visit: Payer: Self-pay | Admitting: Family Medicine

## 2023-01-29 ENCOUNTER — Ambulatory Visit: Payer: Medicare HMO | Admitting: Family Medicine

## 2023-01-30 ENCOUNTER — Ambulatory Visit (INDEPENDENT_AMBULATORY_CARE_PROVIDER_SITE_OTHER): Payer: Medicare HMO | Admitting: Family Medicine

## 2023-01-30 ENCOUNTER — Encounter: Payer: Self-pay | Admitting: Family Medicine

## 2023-01-30 VITALS — BP 106/62 | HR 90 | Temp 98.4°F | Ht 62.75 in | Wt 146.0 lb

## 2023-01-30 DIAGNOSIS — R2689 Other abnormalities of gait and mobility: Secondary | ICD-10-CM

## 2023-01-30 DIAGNOSIS — K3184 Gastroparesis: Secondary | ICD-10-CM | POA: Diagnosis not present

## 2023-01-30 DIAGNOSIS — R079 Chest pain, unspecified: Secondary | ICD-10-CM | POA: Diagnosis not present

## 2023-01-30 DIAGNOSIS — K449 Diaphragmatic hernia without obstruction or gangrene: Secondary | ICD-10-CM

## 2023-01-30 DIAGNOSIS — E1143 Type 2 diabetes mellitus with diabetic autonomic (poly)neuropathy: Secondary | ICD-10-CM | POA: Diagnosis not present

## 2023-01-30 DIAGNOSIS — F430 Acute stress reaction: Secondary | ICD-10-CM | POA: Diagnosis not present

## 2023-01-30 HISTORY — DX: Other abnormalities of gait and mobility: R26.89

## 2023-01-30 MED ORDER — FLUOXETINE HCL 40 MG PO CAPS: 40.0000 mg | ORAL_CAPSULE | Freq: Every day | ORAL | 1 refills | Status: DC

## 2023-01-30 NOTE — Assessment & Plan Note (Addendum)
Some improvement in chest discomfort with addn of famotidine 20 mg bid  Aloreasy on ppi omeprazole 20 mg daily  Encouraged not to eat before lying down

## 2023-01-30 NOTE — Assessment & Plan Note (Signed)
Pt did not note much difference holding trulicity after 2 weeks so she resumed it  Appetite is improved Chest discomfort improved with addn of famotidine

## 2023-01-30 NOTE — Assessment & Plan Note (Addendum)
Some improvement with addition of famotidine but not resolved In midst of cardiology follow up - stress testing and CTA upcomiing Echo reassuring   Will not recommend additional exercise until this is completed  Reviewed cardiology notes and echo today

## 2023-01-30 NOTE — Progress Notes (Signed)
Subjective:    Patient ID: Valerie Ball, female    DOB: December 06, 1945, 77 y.o.   MRN: [id number]  HPI  Wt Readings from Last 3 Encounters:  01/30/23 146 lb (66.2 kg)  01/04/23 145 lb 3.2 oz (65.9 kg)  12/29/22 145 lb 8 oz (66 kg)   26.07 kg/m  Vitals:   01/30/23 1057  BP: 106/62  Pulse: 90  Temp: 98.4 F (36.9 C)  SpO2: 95%   Pt presents for follow up of chronic health problems including  Diaphragmatic hernia  Gastroparesis (on trulicity)  Loss of balance/generalized weakness  Last visit added famotidine for HH and reflux (was having chest pain)  Instructed to hold glp-1 med and discuss with her endocrinologist  She held it for 2 weeks  No difference so went back to it    Encouraged better hydration and po intake  Eating more now  Appetite is back / stable  Trying hard to drink fluids  Using some crystal lite to improve water flavor    Was also ref to cardiology  Ordered monitor and echo (reassuring with some calcification of aortic valve) Scheduled PET CT cardiac  Planning stress test   Still does not feel stronger than a month ago  Feels weak all over  This is why she is doing the stress testing  Exercise intolerance   She is more aware of chest pain  Is improved with the addition of H2 blocker    Stress reaction  Husband- depression/change in personality   Her has good and bad days  Sometimes irritable  She is also   She is not in counseling  May consider that later    She takes zoloft 100 mg daily  Is open to change      01/30/2023   11:05 AM 12/29/2022   11:13 AM 05/11/2022   11:38 AM 03/10/2022    9:38 AM 03/08/2021    1:27 PM  Depression screen PHQ 2/9  Decreased Interest 3 3 0 0 0  Down, Depressed, Hopeless 3 1 1  0 1  PHQ - 2 Score 6 4 1  0 1  Altered sleeping 2 3 2     Tired, decreased energy 3 3 3     Change in appetite 0 2 1    Feeling bad or failure about yourself  0 0 0    Trouble concentrating 3 3 3     Moving slowly or  fidgety/restless 0 1 3    Suicidal thoughts 0 0 0    PHQ-9 Score 14 16 13     Difficult doing work/chores Very difficult Very difficult Somewhat difficult    Feels down and tired  Low motivation      01/30/2023   11:05 AM 12/29/2022   11:13 AM 05/11/2022   11:38 AM  GAD 7 : Generalized Anxiety Score  Nervous, Anxious, on Edge 1 1 0  Control/stop worrying 1 1 2   Worry too much - different things 1 2 2   Trouble relaxing 1 2 2   Restless 1 0 2  Easily annoyed or irritable 2 1 1   Afraid - awful might happen 0 1 0  Total GAD 7 Score 7 8 9   Anxiety Difficulty Very difficult Somewhat difficult Somewhat difficult         Patient Active Problem List   Diagnosis Date Noted   Poor balance 01/30/2023   Dyspnea on exertion 01/04/2023   Palpitations 01/04/2023   Allergy    Angiodysplasia of colon    Anxiety  Arthritis    Borderline diabetic    Colon polyp    Diverticulosis    GI bleed    Hepatic steatosis    Hiatal hernia    Hyperlipidemia    Hypertension    Macular edema    Menopause    Postmenopausal hormone therapy    Seizure disorder (HCC)    Seizures (HCC)    Chest pain 12/29/2022   Bilateral knee pain 05/11/2022   Decreased calculated GFR 05/11/2022   Blurred vision, right eye 08/11/2021   History of COVID-19 09/24/2020   Fever, low grade 09/24/2020   Posterior vitreous detachment of right eye 11/06/2019   Cystoid macular edema of left eye 07/14/2019   Branch retinal vein occlusion with macular edema of left eye 07/14/2019   Hypertensive retinopathy of left eye 07/14/2019   Vitreous floaters of left eye 07/14/2019   Fatigue 05/20/2018   Hip pain, bilateral 08/31/2017   Vitamin B12 deficiency 09/21/2016   Diabetic gastroparesis (HCC) 03/27/2016   Retinal vein occlusion, branch 01/03/2016   Alternating constipation and diarrhea 08/12/2015   AVM (arteriovenous malformation) of colon 05/18/2015   IBS (irritable bowel syndrome) 02/28/2015   Diverticulitis of  colon 10/14/2014   Gout 07/22/2014   Hypothyroidism 04/23/2014   Encounter for Medicare annual wellness exam 01/23/2014   Estrogen deficiency 01/23/2014   Colon cancer screening 01/23/2014   Tremor 01/20/2014   Dizziness 01/07/2014   Allergic rhinitis 01/07/2014   Thrombocytopenia (HCC) 11/25/2013   Diabetic neuropathy (HCC) 11/25/2013   History of removal of implants of both breasts 07/01/2013   Lap Nissen October 2013 01/19/2012   Large type III mixed hiatus hernia  12/06/2011   Stress reaction 10/25/2011   Routine general medical examination at a health care facility 07/11/2011   TRANSAMINASES, SERUM, ELEVATED 11/11/2009   History of breast cancer 08/02/2009   Breast cancer (HCC) 02/2009   ESOPHAGEAL STRICTURE 03/02/2008   Diaphragmatic hernia 03/02/2008   Peritoneal adhesions 03/02/2008   Anemia 02/07/2008   Melena 01/2008   Poorly controlled type 2 diabetes mellitus with neuropathy (HCC) 08/19/2007   GAD (generalized anxiety disorder) 04/15/2007   DISORDERS, ORGANIC SLEEP NEC 08/23/2006   SYNDROME, RESTLESS LEGS 08/23/2006   SYNDROME, CARPAL TUNNEL 08/23/2006   Hyperlipidemia associated with type 2 diabetes mellitus (HCC) 08/22/2006   Essential hypertension 08/22/2006   Allergic rhinitis 08/22/2006   Fatty liver 08/22/2006   History of seizure 08/22/2006   Colon stricture (HCC) 01/14/2003   Salmonella 05/2000   Past Medical History:  Diagnosis Date   Allergic rhinitis 08/22/2006   Qualifier: Diagnosis of   By: Lindwood Qua CMA, Jerl Santos      IMO SNOMED Dx Update Oct 2024     Allergy    allegic rhinitis   Alternating constipation and diarrhea 08/12/2015   Anemia    Angiodysplasia of colon    Anxiety    Arthritis    AVM (arteriovenous malformation) of colon    Bilateral knee pain 05/11/2022   Blurred vision, right eye 08/11/2021   Patient does have blurred vision right eye,     Borderline diabetic    per patient medical history form   Branch retinal vein  occlusion with macular edema of left eye 07/14/2019   CME superior to the fovea OS has worsened at 7-week interval from macular branch retinal vein occlusion, repeat Avastin today and evaluate exam in 6 weeks     Breast cancer (HCC) 02/2009   breast CA L invasive ductal CA(hormone receptor positive.)  Chest pain 12/29/2022   Colon cancer screening 01/23/2014   Colon polyp    hyperplastic   Colon stricture (HCC) 01/14/2003   Cystoid macular edema of left eye 07/14/2019   Decreased calculated GFR 05/11/2022   Diabetic gastroparesis (HCC) 03/27/2016   Typical symptoms -suspect      No detectable diabetic retinopathy 12-27-2020     Diabetic neuropathy (HCC) 11/25/2013   Diaphragmatic hernia 03/02/2008   Qualifier: Diagnosis of   By: Koleen Distance CMA Duncan Dull), Hulan Saas      IMO SNOMED Dx Update Oct 2024     Diverticulitis of colon 10/14/2014   Diverticulosis    Dizziness 01/07/2014   Encounter for Medicare annual wellness exam 01/23/2014   ESOPHAGEAL STRICTURE 03/02/2008   Qualifier: Diagnosis of   By: Koleen Distance CMA (AAMA), Hulan Saas         Essential hypertension 08/22/2006   Qualifier: Diagnosis of   By: Lindwood Qua CMA, Jerl Santos         Estrogen deficiency 01/23/2014   Fatigue 05/20/2018   Fatty liver    Fever, low grade 09/24/2020   GAD (generalized anxiety disorder) 04/15/2007   Qualifier: Diagnosis of   By: Jillyn Hidden FNP, Mcarthur Rossetti        GI bleed    Gout    Hepatic steatosis    Hiatal hernia    Hip pain, bilateral 08/31/2017   History of breast cancer 08/02/2009   Qualifier: Diagnosis of   By: [name] MD, Colon Flattery        History of COVID-19 09/24/2020   History of removal of implants of both breasts 07/01/2013   06/25/13 elected to have bilateral breast implants removed.     History of seizure 08/22/2006   Qualifier: Diagnosis of   By: Lindwood Qua CMA, Jerl Santos         Hyperlipidemia    Hyperlipidemia associated with type 2 diabetes mellitus (HCC) 08/22/2006   Qualifier: Diagnosis of    By: Lindwood Qua CMA, Jerl Santos         Hypertension    Hypertensive retinopathy of left eye 07/14/2019   Hypothyroidism    IBS (irritable bowel syndrome)    Lap Nissen October 2013 01/19/2012   Laparoscopic takedown of large type III mixed hiatus hernia with primary closure of the diaphragm with 3 sutures posteriorally (post 2 with pledgets) and Nissen fundoplication over a #56 lighted bougie        Large type III mixed hiatus hernia  12/06/2011   Plan surgery to repair diaphragm and do Nissen fundoplication     Macular edema    left eye   Melena 01/2008   anemia transfusion   Menopause    per patient medical history form   Peritoneal adhesions 03/02/2008   Qualifier: Diagnosis of   By: Koleen Distance CMA Duncan Dull), Leisha      IMO SNOMED Dx Update Oct 2024     Poorly controlled type 2 diabetes mellitus with neuropathy (HCC) 08/19/2007   Qualifier: Diagnosis of   By: [name] MD, Colon Flattery        Posterior vitreous detachment of right eye 11/06/2019   Postmenopausal hormone therapy    per patient medical history form.   Retinal vein occlusion, branch 01/03/2016   Left with hemorrhage and edema (Dr Luciana Axe) 2017     Routine general medical examination at a health care facility 07/11/2011   Salmonella 05/2000   renal effects secondary to dehydration   Seizure disorder (HCC)    Seizures (HCC)  one seizure several yrs ago --etiology unknown-no problem since   Stress reaction 10/25/2011   SYNDROME, CARPAL TUNNEL 08/23/2006   Qualifier: Diagnosis of   By: [name] MD, Colon Flattery        SYNDROME, RESTLESS LEGS 08/23/2006   Qualifier: Diagnosis of   By: [name] MD, Colon Flattery        Thrombocytopenia (HCC) 11/25/2013   TRANSAMINASES, SERUM, ELEVATED 11/11/2009   Qualifier: Diagnosis of   By: [name] MD, Colon Flattery        Tremor 01/20/2014   Head and right hand      Vitamin B12 deficiency 09/21/2016   Vitreous floaters of left eye 07/14/2019   The nature of vitreous floaters was discussed with the patient  as well as their frequent occurrence with development of posterior vitreous detachment. The significance of flashes and field cuts was discussed with the patient. The need for a dilated fundus examination was discussed and advice given to return immediately for new or different floaters .  More uncommonly, vitreous floaters, debris, or   Past Surgical History:  Procedure Laterality Date   ABDOMINAL HYSTERECTOMY  1983   BREAST SURGERY  02/2009   breast biopsy invasive ductal CA-bilateral mastectomies   EPIGASTRIC HERNIA REPAIR  01/02/2012   Procedure: HERNIA REPAIR EPIGASTRIC ADULT;  Surgeon: [name], MD;  Location: [facility];  Service: General;  Laterality: N/A;  Laparoscopic Repair Paraesophageal Hernia, Nissen   EYE SURGERY Left 07/2017   Dr. Elwyn Lade CYST EXCISION     LAPAROSCOPIC NISSEN FUNDOPLICATION  01/02/2012   Procedure: LAPAROSCOPIC NISSEN FUNDOPLICATION;  Surgeon: [name], MD;  Location: [facility];  Service: General;  Laterality: N/A;   MASTECTOMY  04/2009   Bilateral for ductal carcinoma on L   OVARY SURGERY  1986   ovarian tumor removed   TUBAL LIGATION     Social History   Tobacco Use   Smoking status: Never   Smokeless tobacco: Never  Vaping Use   Vaping status: Never Used  Substance Use Topics   Alcohol use: Yes    Comment: occasional   Drug use: Never   Family History  Problem Relation Age of Onset   Hypertension Mother    Stroke Mother    Breast cancer Mother    Heart attack Father    Diabetes Father    Esophageal cancer Father    Breast cancer Sister        x 2 sisters   Diabetes Sister    Hypertension Sister    Breast cancer Sister    Hypertension Brother    Diabetes Brother    Allergies  Allergen Reactions   Arimidex [Anastrozole] Other (See Comments)    Joint pain   Glipizide Other (See Comments)    Hypoglycemia    Metformin And Related Other (See Comments)    diarrhea   Penicillins Hives and Rash   Current  Outpatient Medications on File Prior to Visit  Medication Sig Dispense Refill   Alpha-Lipoic Acid 600 MG TABS Take 600 mg by mouth 2 (two) times daily. 180 tablet 3   amLODipine (NORVASC) 2.5 MG tablet Take 1 tablet (2.5 mg total) by mouth daily. 90 tablet 3   Calcium Carbonate (CALCIUM 500 PO) Take 1 capsule by mouth daily.     Cholecalciferol (VITAMIN D3) 1000 units CAPS Take 1 capsule by mouth daily.     Cyanocobalamin (B-12) 1000 MCG TABS Take 1 tablet by mouth daily.  Dulaglutide (TRULICITY) 3 MG/0.5ML SOPN INJECT 0.5 ML (3 MG) UNDER THE SKIN ONCE A WEEK 6 mL 3   empagliflozin (JARDIANCE) 25 MG TABS tablet Take 1 tablet (25 mg total) by mouth daily before breakfast. 90 tablet 3   ezetimibe (ZETIA) 10 MG tablet TAKE 1 TABLET DAILY 90 tablet 3   famotidine (PEPCID) 20 MG tablet Take 1 tablet (20 mg total) by mouth 2 (two) times daily. 180 tablet 1   fluticasone (FLONASE) 50 MCG/ACT nasal spray USE 2 SPRAYS IN EACH NOSTRIL DAILY AS NEEDED FOR ALLERGIES 48 g 0   hydrochlorothiazide (HYDRODIURIL) 25 MG tablet TAKE 1 TABLET DAILY 90 tablet 2   levothyroxine (SYNTHROID) 50 MCG tablet TAKE 1 TABLET DAILY BEFORE BREAKFAST 90 tablet 1   losartan (COZAAR) 100 MG tablet TAKE 1 TABLET DAILY 90 tablet 2   metoprolol tartrate (LOPRESSOR) 50 MG tablet Please take 50 mg ( 1 tablet) the night before and 50 mg ( 1 tablet) the morning of your CT 2 tablet 0   Multiple Vitamin (MULTIVITAMIN WITH MINERALS) TABS tablet Take 1 tablet by mouth daily.     omeprazole (PRILOSEC) 20 MG capsule TAKE 1 CAPSULE DAILY 90 capsule 2   repaglinide (PRANDIN) 1 MG tablet TAKE 1 TABLET DAILY BEFORE SUPPER 90 tablet 3   rOPINIRole (REQUIP) 1 MG tablet TAKE 1 TABLET AT BEDTIME 90 tablet 3   rosuvastatin (CRESTOR) 10 MG tablet TAKE 1 TABLET TWICE A WEEK 24 tablet 1   No current facility-administered medications on file prior to visit.    Review of Systems  Constitutional:  Positive for fatigue. Negative for activity change,  appetite change, fever and unexpected weight change.  HENT:  Negative for congestion, ear pain, rhinorrhea, sinus pressure and sore throat.   Eyes:  Negative for pain, redness and visual disturbance.  Respiratory:  Negative for cough, shortness of breath and wheezing.   Cardiovascular:  Negative for chest pain and palpitations.  Gastrointestinal:  Negative for abdominal pain, blood in stool, constipation and diarrhea.  Endocrine: Negative for polydipsia and polyuria.  Genitourinary:  Negative for dysuria, frequency and urgency.  Musculoskeletal:  Negative for arthralgias, back pain and myalgias.  Skin:  Negative for pallor and rash.  Allergic/Immunologic: Negative for environmental allergies.  Neurological:  Positive for weakness. Negative for dizziness, syncope and headaches.       Poor balance   Hematological:  Negative for adenopathy. Does not bruise/bleed easily.  Psychiatric/Behavioral:  Positive for decreased concentration, dysphoric mood and sleep disturbance. Negative for self-injury and suicidal ideas. The patient is nervous/anxious.        Objective:   Physical Exam Constitutional:      General: She is not in acute distress.    Appearance: Normal appearance. She is well-developed and normal weight. She is not ill-appearing or diaphoretic.     Comments: Frail appearing   HENT:     Head: Normocephalic and atraumatic.  Eyes:     Conjunctiva/sclera: Conjunctivae normal.     Pupils: Pupils are equal, round, and reactive to light.  Neck:     Thyroid: No thyromegaly.     Vascular: No carotid bruit or JVD.  Cardiovascular:     Rate and Rhythm: Normal rate and regular rhythm.     Heart sounds: Normal heart sounds.     No gallop.  Pulmonary:     Effort: Pulmonary effort is normal. No respiratory distress.     Breath sounds: Normal breath sounds. No wheezing or rales.  Abdominal:  General: There is no distension or abdominal bruit.     Palpations: Abdomen is soft.      Tenderness: There is no abdominal tenderness. There is no guarding or rebound.  Musculoskeletal:     Cervical back: Normal range of motion and neck supple.     Right lower leg: No edema.     Left lower leg: No edema.  Lymphadenopathy:     Cervical: No cervical adenopathy.  Skin:    General: Skin is warm and dry.     Coloration: Skin is not pale.     Findings: No rash.  Neurological:     Mental Status: She is alert.     Coordination: Coordination normal.     Deep Tendon Reflexes: Reflexes are normal and symmetric. Reflexes normal.     Comments: Slow intentional gait  Wide based but not shuffling   Psychiatric:        Attention and Perception: Attention normal.        Mood and Affect: Mood is depressed. Affect is blunt.        Speech: Speech normal.        Behavior: Behavior normal.     Comments: Candidly discusses symptoms and stressors             Assessment & Plan:   Problem List Items Addressed This Visit       Respiratory   Diaphragmatic hernia    Some improvement in chest discomfort with addn of famotidine 20 mg bid  Aloreasy on ppi omeprazole 20 mg daily  Encouraged not to eat before lying down        Digestive   Diabetic gastroparesis (HCC)    Pt did not note much difference holding trulicity after 2 weeks so she resumed it  Appetite is improved Chest discomfort improved with addn of famotidine         Other   Chest pain    Some improvement with addition of famotidine but not resolved In midst of cardiology follow up - stress testing and CTA upcomiing Echo reassuring   Will not recommend additional exercise until this is completed  Reviewed cardiology notes and echo today      Poor balance    May be due to deconditioning from less activity over the past year  Gait is unsteady Discussed fall prevention   Would benefit from safe exercise once cardiac work up is complete       Stress reaction - Primary    This has been worse in setting of  husband with mental health issues and her personal medical ones   Has been on sertraline long term More tired/less motivated Will try switch to fluoxetine 40 mg daily  Discussed expectations of SSRI medication including time to effectiveness and mechanism of action, also poss of side effects (early and late)- including mental fuzziness, weight or appetite change, nausea and poss of worse dep or anxiety (even suicidal thoughts)  Pt voiced understanding and will stop med and update if this occurs   Will update  Follow up planned Strongly encouraged self care  Encouraged to consider counseling also      Relevant Medications   FLUoxetine (PROZAC) 40 MG capsule

## 2023-01-30 NOTE — Assessment & Plan Note (Signed)
May be due to deconditioning from less activity over the past year  Gait is unsteady Discussed fall prevention   Would benefit from safe exercise once cardiac work up is complete

## 2023-01-30 NOTE — Assessment & Plan Note (Signed)
This has been worse in setting of husband with mental health issues and her personal medical ones   Has been on sertraline long term More tired/less motivated Will try switch to fluoxetine 40 mg daily  Discussed expectations of SSRI medication including time to effectiveness and mechanism of action, also poss of side effects (early and late)- including mental fuzziness, weight or appetite change, nausea and poss of worse dep or anxiety (even suicidal thoughts)  Pt voiced understanding and will stop med and update if this occurs   Will update  Follow up planned Strongly encouraged self care  Encouraged to consider counseling also

## 2023-01-30 NOTE — Patient Instructions (Addendum)
I want to change from sertraline (zoloft) to fluoxetine (prozac) to see if this would be more stimulating and help with motivation   Stop the zoloft  Start prozac 40 mg daily   In the future let's think about a counselof   If any side effects or you feel worse - let us know   Continue the pepcid (famotidine) for extra acid control  Proceed with cardiac testing   You can continue current exercise if it does not cause chest pain  Don't add anything until we get heart testing donw   Work on good nutrition and fluid intake   Follow up here in 6-8 weeks

## 2023-02-01 DIAGNOSIS — I080 Rheumatic disorders of both mitral and aortic valves: Secondary | ICD-10-CM | POA: Insufficient documentation

## 2023-02-01 DIAGNOSIS — I2510 Atherosclerotic heart disease of native coronary artery without angina pectoris: Secondary | ICD-10-CM

## 2023-02-01 HISTORY — DX: Atherosclerotic heart disease of native coronary artery without angina pectoris: I25.10

## 2023-02-01 HISTORY — DX: Rheumatic disorders of both mitral and aortic valves: I08.0

## 2023-02-05 ENCOUNTER — Ambulatory Visit: Payer: Medicare HMO

## 2023-02-05 VITALS — BP 128/74 | HR 90 | Ht 62.0 in | Wt 147.0 lb

## 2023-02-05 DIAGNOSIS — I25119 Atherosclerotic heart disease of native coronary artery with unspecified angina pectoris: Secondary | ICD-10-CM | POA: Diagnosis not present

## 2023-02-05 DIAGNOSIS — I080 Rheumatic disorders of both mitral and aortic valves: Secondary | ICD-10-CM | POA: Diagnosis not present

## 2023-02-05 DIAGNOSIS — I10 Essential (primary) hypertension: Secondary | ICD-10-CM | POA: Diagnosis not present

## 2023-02-05 MED ORDER — ASPIRIN 81 MG PO TBEC: 81.0000 mg | DELAYED_RELEASE_TABLET | Freq: Every day | ORAL | Status: DC

## 2023-02-05 NOTE — Assessment & Plan Note (Signed)
Well-controlled. Target below 130 over 80 mmHg. She has been doing well with reduced dose of amlodipine at 2.5 mg once daily. She is also currently on hydrochlorothiazide 25 mg once daily and losartan 100 mg once daily. If she is having any significant increase in her  symptoms of lightheadedness with positional change, will cut back losartan dose.

## 2023-02-05 NOTE — Assessment & Plan Note (Signed)
With ongoing symptoms of dyspnea on exertion which have been slowly progressive and LAD anatomy and distal vessel could not be modeled on CT FFR, cardiac PET/CT stress test was recommended. This has been ordered and is currently pending for December 4. In the context of coronary artery atherosclerosis identified, recommended to start aspirin 81 mg once daily. She is currently on statins rosuvastatin 10 mg daily.  Advised to avoid moderate to heavy exertion until test is completed. We reviewed if the test comes back abnormal showing ischemia burden into the LAD territory, will recommend cardiac catheterization to assess candidacy for revascularization.  They are agreeable with this.  Currently on antianginal regimen with amlodipine 2.5 mg once daily.

## 2023-02-05 NOTE — Progress Notes (Signed)
Cardiology Consultation:    Date:  02/05/2023   ID:  [name], DOB [birth year], MRN [id number]  PCP:  Valerie Pimple, MD  Cardiologist:  Valerie Corporal [name], MD   Referring MD: Valerie Pimple, MD   No chief complaint on file.    ASSESSMENT AND PLAN:   Valerie Ball 77 year old with a history of moderate atherosclerosis of coronaries with moderate mid LAD stenosis, pending cardiac PET/CT stress test for further evaluation, mild mitral stenosis, mild aortic insufficiency, diabetes mellitus, hypertension, hyperlipidemia, hypothyroidism, irritable bowel syndrome, hiatal hernia s/p unsuccessful repair, seizure disorder, chronic thrombocytopenia, gastroparesis, nonalcoholic steatohepatitis, chronic dizziness, chronic microvascular ischemic changes on CT head September 2024, hemorrhoids, palpitations without significant abnormalities on event monitor October 24 other than short 8 beat runs of SVT consistent with ectopic atrial beats, presents for follow-up visit.  Problem List Items Addressed This Visit     Hypertension    Well-controlled. Target below 130 over 80 mmHg. She has been doing well with reduced dose of amlodipine at 2.5 mg once daily. She is also currently on hydrochlorothiazide 25 mg once daily and losartan 100 mg once daily. If she is having any significant increase in her  symptoms of lightheadedness with positional change, will cut back losartan dose.        Relevant Medications   aspirin EC 81 MG tablet   Mitral stenosis and aortic incompetence, by TTE 01/25/2023    Mildly increased mean gradient 4.5 mmHg, with valve area calculated by pressure half-time 2.18 cm on echocardiogram.  Will follow-up with repeat echocardiogram tentatively in 1 year.       Relevant Medications   aspirin EC 81 MG tablet   CAD (coronary artery disease)  CT Cardiac 01/19/2023 calcium score 411, plaque volume 366, moderate stenosis LAD, mild stenosis D1, RCA, LCx/OM 3 - Primary    With  ongoing symptoms of dyspnea on exertion which have been slowly progressive and LAD anatomy and distal vessel could not be modeled on CT FFR, cardiac PET/CT stress test was recommended. This has been ordered and is currently pending for December 4. In the context of coronary artery atherosclerosis identified, recommended to start aspirin 81 mg once daily. She is currently on statins rosuvastatin 10 mg daily.  Advised to avoid moderate to heavy exertion until test is completed. We reviewed if the test comes back abnormal showing ischemia burden into the LAD territory, will recommend cardiac catheterization to assess candidacy for revascularization.  They are agreeable with this.  Currently on antianginal regimen with amlodipine 2.5 mg once daily.       Relevant Medications   aspirin EC 81 MG tablet   Will review results of cardiac PET stress test once available and will decide on next follow-up.   History of Present Illness:    Valerie Ball is a 77 y.o. female who is being seen today for follow-up visit, last visit with [place] in the office was 01-04-2023. PCP is [facility], Audrie Gallus, MD.  History of coronary artery disease with moderate atherosclerosis calcium score 411, moderate stenosis of mid LAD, with distal vessel not moderate on CT FFR for imaging and currently pending further evaluation with cardiac PET stress test. Also has history of mild mitral stenosis, mild aortic insufficiency, diabetes mellitus, hypertension, hyperlipidemia, hypothyroidism, irritable bowel syndrome, hiatal hernia s/p repair unsuccessful about 10 years ago, seizure disorder, chronic thrombocytopenia, gastroparesis, nonalcoholic steatohepatitis, chronic dizziness, palpitations, chronic microvascular ischemic changes on CT head September 2024 hemorrhoids with occasional  blood observed with bowel movement.  Pleasant woman here for the visit accompanied by her husband. Felt an episode of fluttering in the chest, lasted  for few minutes last night when she laid down to sleep.  Described it as a sensation of fast heartbeat.  No further recurrence.  Did not have any such episodes while she had the event monitor on.  Continues to have symptoms shortness of breath with walking halfway down her driveway.  No further worsening since her last visit.  Occasionally feels lightheaded bending down and standing up.  No falls or syncopal episodes.  No blood in urine.  Blood pressures at home well-controlled. Taking her medications regularly. Feels Prilosec and Pepcid have helped improve her reflux gastritis symptoms.  Past Medical History:  Diagnosis Date   Allergic rhinitis 08/22/2006   Qualifier: Diagnosis of   By: Lindwood Qua CMA, Jerl Santos      IMO SNOMED Dx Update Oct 2024     Allergy    allegic rhinitis   Alternating constipation and diarrhea 08/12/2015   Anemia    Angiodysplasia of colon    Anxiety    Arthritis    AVM (arteriovenous malformation) of colon    Bilateral knee pain 05/11/2022   Blurred vision, right eye 08/11/2021   Patient does have blurred vision right eye,     Borderline diabetic    per patient medical history form   Branch retinal vein occlusion with macular edema of left eye 07/14/2019   CME superior to the fovea OS has worsened at 7-week interval from macular branch retinal vein occlusion, repeat Avastin today and evaluate exam in 6 weeks     Breast cancer (HCC) 02/2009   breast CA L invasive ductal CA(hormone receptor positive.)   Chest pain 12/29/2022   Colon cancer screening 01/23/2014   Colon polyp    hyperplastic   Colon stricture (HCC) 01/14/2003   Cystoid macular edema of left eye 07/14/2019   Decreased calculated GFR 05/11/2022   Diabetic gastroparesis (HCC) 03/27/2016   Typical symptoms -suspect      No detectable diabetic retinopathy 12-27-2020     Diabetic neuropathy (HCC) 11/25/2013   Diaphragmatic hernia 03/02/2008   Qualifier: Diagnosis of   By: Koleen Distance CMA Duncan Dull),  Leisha      IMO SNOMED Dx Update Oct 2024     Diverticulitis of colon 10/14/2014   Diverticulosis    Dizziness 01/07/2014   Dyspnea on exertion 01/04/2023   Encounter for Medicare annual wellness exam 01/23/2014   ESOPHAGEAL STRICTURE 03/02/2008   Qualifier: Diagnosis of   By: Koleen Distance CMA (AAMA), Hulan Saas         Essential hypertension 08/22/2006   Qualifier: Diagnosis of   By: Lindwood Qua CMA, Jerl Santos         Estrogen deficiency 01/23/2014   Fatigue 05/20/2018   Fatty liver    Fever, low grade 09/24/2020   GAD (generalized anxiety disorder) 04/15/2007   Qualifier: Diagnosis of   By: Jillyn Hidden FNP, Mcarthur Rossetti        GI bleed    Gout    Hepatic steatosis    Hiatal hernia    Hip pain, bilateral 08/31/2017   History of breast cancer 08/02/2009   Qualifier: Diagnosis of   By: [name] MD, Colon Flattery        History of COVID-19 09/24/2020   History of removal of implants of both breasts 07/01/2013   06/25/13 elected to have bilateral breast implants removed.     History  of seizure 08/22/2006   Qualifier: Diagnosis of   By: Lindwood Qua CMA, Jerl Santos         Hyperlipidemia    Hyperlipidemia associated with type 2 diabetes mellitus (HCC) 08/22/2006   Qualifier: Diagnosis of   By: Lindwood Qua CMA, Jerl Santos         Hypertension    Hypertensive retinopathy of left eye 07/14/2019   Hypothyroidism    IBS (irritable bowel syndrome)    Lap Nissen October 2013 01/19/2012   Laparoscopic takedown of large type III mixed hiatus hernia with primary closure of the diaphragm with 3 sutures posteriorally (post 2 with pledgets) and Nissen fundoplication over a #56 lighted bougie        Large type III mixed hiatus hernia  12/06/2011   Plan surgery to repair diaphragm and do Nissen fundoplication     Macular edema    left eye   Melena 01/2008   anemia transfusion   Menopause    per patient medical history form   Palpitations 01/04/2023   Peritoneal adhesions 03/02/2008   Qualifier: Diagnosis of   By:  Koleen Distance CMA Duncan Dull), Leisha      IMO SNOMED Dx Update Oct 2024     Poor balance 01/30/2023   Poorly controlled type 2 diabetes mellitus with neuropathy (HCC) 08/19/2007   Qualifier: Diagnosis of   By: [name] MD, Colon Flattery        Posterior vitreous detachment of right eye 11/06/2019   Postmenopausal hormone therapy    per patient medical history form.   Retinal vein occlusion, branch 01/03/2016   Left with hemorrhage and edema (Dr Luciana Axe) 2017     Routine general medical examination at a health care facility 07/11/2011   Salmonella 05/2000   renal effects secondary to dehydration   Seizure disorder (HCC)    Seizures (HCC)    one seizure several yrs ago --etiology unknown-no problem since   Stress reaction 10/25/2011   SYNDROME, CARPAL TUNNEL 08/23/2006   Qualifier: Diagnosis of   By: [name] MD, Colon Flattery        SYNDROME, RESTLESS LEGS 08/23/2006   Qualifier: Diagnosis of   By: [name] MD, Colon Flattery        Thrombocytopenia (HCC) 11/25/2013   TRANSAMINASES, SERUM, ELEVATED 11/11/2009   Qualifier: Diagnosis of   By: [name] MD, Colon Flattery        Tremor 01/20/2014   Head and right hand      Vitamin B12 deficiency 09/21/2016   Vitreous floaters of left eye 07/14/2019   The nature of vitreous floaters was discussed with the patient as well as their frequent occurrence with development of posterior vitreous detachment. The significance of flashes and field cuts was discussed with the patient. The need for a dilated fundus examination was discussed and advice given to return immediately for new or different floaters .  More uncommonly, vitreous floaters, debris, or    Past Surgical History:  Procedure Laterality Date   ABDOMINAL HYSTERECTOMY  1983   BREAST SURGERY  02/2009   breast biopsy invasive ductal CA-bilateral mastectomies   EPIGASTRIC HERNIA REPAIR  01/02/2012   Procedure: HERNIA REPAIR EPIGASTRIC ADULT;  Surgeon: [name], MD;  Location: [facility];  Service: General;  Laterality: N/A;   Laparoscopic Repair Paraesophageal Hernia, Nissen   EYE SURGERY Left 07/2017   Dr. Elwyn Lade CYST EXCISION     LAPAROSCOPIC NISSEN FUNDOPLICATION  01/02/2012   Procedure: LAPAROSCOPIC NISSEN FUNDOPLICATION;  Surgeon: [name],  MD;  Location: [facility];  Service: General;  Laterality: N/A;   MASTECTOMY  04/2009   Bilateral for ductal carcinoma on L   OVARY SURGERY  1986   ovarian tumor removed   TUBAL LIGATION      Current Medications: Current Meds  Medication Sig   Alpha-Lipoic Acid 600 MG TABS Take 600 mg by mouth 2 (two) times daily.   amLODipine (NORVASC) 2.5 MG tablet Take 1 tablet (2.5 mg total) by mouth daily.   aspirin EC 81 MG tablet Take 1 tablet (81 mg total) by mouth daily. Swallow whole.   Calcium Carbonate (CALCIUM 500 PO) Take 1 capsule by mouth daily.   Cholecalciferol (VITAMIN D3) 1000 units CAPS Take 1 capsule by mouth daily.   Cyanocobalamin (B-12) 1000 MCG TABS Take 1 tablet by mouth daily.   Dulaglutide (TRULICITY) 3 MG/0.5ML SOPN INJECT 0.5 ML (3 MG) UNDER THE SKIN ONCE A WEEK   empagliflozin (JARDIANCE) 25 MG TABS tablet Take 1 tablet (25 mg total) by mouth daily before breakfast.   ezetimibe (ZETIA) 10 MG tablet TAKE 1 TABLET DAILY   famotidine (PEPCID) 20 MG tablet Take 1 tablet (20 mg total) by mouth 2 (two) times daily.   FLUoxetine (PROZAC) 40 MG capsule Take 1 capsule (40 mg total) by mouth daily.   fluticasone (FLONASE) 50 MCG/ACT nasal spray USE 2 SPRAYS IN EACH NOSTRIL DAILY AS NEEDED FOR ALLERGIES   hydrochlorothiazide (HYDRODIURIL) 25 MG tablet TAKE 1 TABLET DAILY   levothyroxine (SYNTHROID) 50 MCG tablet TAKE 1 TABLET DAILY BEFORE BREAKFAST   losartan (COZAAR) 100 MG tablet TAKE 1 TABLET DAILY   metoprolol tartrate (LOPRESSOR) 50 MG tablet Please take 50 mg ( 1 tablet) the night before and 50 mg ( 1 tablet) the morning of your CT   Multiple Vitamin (MULTIVITAMIN WITH MINERALS) TABS tablet Take 1 tablet by mouth daily.   omeprazole  (PRILOSEC) 20 MG capsule TAKE 1 CAPSULE DAILY   repaglinide (PRANDIN) 1 MG tablet TAKE 1 TABLET DAILY BEFORE SUPPER   rOPINIRole (REQUIP) 1 MG tablet TAKE 1 TABLET AT BEDTIME   rosuvastatin (CRESTOR) 10 MG tablet TAKE 1 TABLET TWICE A WEEK     Allergies:   Arimidex [anastrozole], Glipizide, Metformin and related, and Penicillins   Social History   Socioeconomic History   Marital status: Married    Spouse name: Not on file   Number of children: 3   Years of education: Not on file   Highest education level: Not on file  Occupational History   Occupation: retired    Associate Professor: UNEMPLOYED  Tobacco Use   Smoking status: Never   Smokeless tobacco: Never  Vaping Use   Vaping status: Never Used  Substance and Sexual Activity   Alcohol use: Yes    Comment: occasional   Drug use: Never   Sexual activity: Not Currently  Other Topics Concern   Not on file  Social History Narrative   ** Merged History Encounter **       1 cup of coffee every morning   Social Determinants of Health   Financial Resource Strain: Medium Risk (03/10/2022)   Overall Financial Resource Strain (CARDIA)    Difficulty of Paying Living Expenses: Somewhat hard  Food Insecurity: No Food Insecurity (03/10/2022)   Hunger Vital Sign    Worried About Running Out of Food in the Last Year: Never true    Ran Out of Food in the Last Year: Never true  Transportation Needs: No Transportation Needs (03/10/2022)  PRAPARE - Administrator, Civil Service (Medical): No    Lack of Transportation (Non-Medical): No  Physical Activity: Inactive (03/10/2022)   Exercise Vital Sign    Days of Exercise per Week: 0 days    Minutes of Exercise per Session: 0 min  Stress: Stress Concern Present (03/10/2022)   [employer] of Occupational Health - Occupational Stress Questionnaire    Feeling of Stress : Rather much  Social Connections: Unknown (03/10/2022)   Social Connection and Isolation Panel [NHANES]     Frequency of Communication with Friends and Family: Not on file    Frequency of Social Gatherings with Friends and Family: Once a week    Attends Religious Services: Not on Insurance claims handler of Clubs or Organizations: Yes    Attends Banker Meetings: 1 to 4 times per year    Marital Status: Married     Family History: The patient's family history includes Breast cancer in her mother, sister, and sister; Diabetes in her brother, father, and sister; Esophageal cancer in her father; Heart attack in her father; Hypertension in her brother, mother, and sister; Stroke in her mother. ROS:   Please see the history of present illness.    All 14 point review of systems negative except as described per history of present illness.  EKGs/Labs/Other Studies Reviewed:    The following studies were reviewed today: Cardiac Studies & Procedures       ECHOCARDIOGRAM  ECHOCARDIOGRAM COMPLETE 01/25/2023  Narrative ECHOCARDIOGRAM REPORT    Patient Name:   Valerie Ball Date of Exam: 01/25/2023 Medical Rec #:  [id number]        Height:       62.8 in Accession #:    [id number]       Weight:       145.2 lb Date of Birth:  12-Apr-1945         BSA:          1.683 m Patient Age:    77 years         BP:           108/73 mmHg Patient Gender: F                HR:           79 bpm. Exam Location:  [facility]  Procedure: 2D Echo, Cardiac Doppler, Color Doppler and Strain Analysis  Indications:    Hyperlipidemia, unspecified hyperlipidemia type [E78.5 (ICD-10-CM)]; Precordial pain [R07.2 (ICD-10-CM)]  History:        Patient has no prior history of Echocardiogram examinations. Signs/Symptoms:Dizziness/Lightheadedness.  Sonographer:    Louie Boston RDCS Referring Phys: [id number] Valerie Corporal Jahvon Gosline  IMPRESSIONS   1. Left ventricular ejection fraction, by estimation, is 60 to 65%. The left ventricle has normal function. The left ventricle has no regional wall motion abnormalities. There is  mild left ventricular hypertrophy. Left ventricular diastolic parameters are consistent with Grade I diastolic dysfunction (impaired relaxation). The average left ventricular global longitudinal strain is -14.2 %. The global longitudinal strain is abnormal. 2. Right ventricular systolic function is normal. The right ventricular size is normal. 3. MVA P1/2t 2.18 cm2. The mitral valve is normal in structure. No evidence of mitral valve regurgitation. Mild to moderate mitral stenosis. The mean mitral valve gradient is 4.5 mmHg. 4. The aortic valve is calcified. There is mild calcification of the aortic valve. There is mild thickening of the aortic valve. Aortic valve regurgitation is  mild. No aortic stenosis is present. 5. The inferior vena cava is normal in size with greater than 50% respiratory variability, suggesting right atrial pressure of 3 mmHg.  FINDINGS Left Ventricle: Left ventricular ejection fraction, by estimation, is 60 to 65%. The left ventricle has normal function. The left ventricle has no regional wall motion abnormalities. The average left ventricular global longitudinal strain is -14.2 %. The global longitudinal strain is abnormal. The left ventricular internal cavity size was normal in size. There is mild left ventricular hypertrophy. Left ventricular diastolic parameters are consistent with Grade I diastolic dysfunction (impaired relaxation).  Right Ventricle: The right ventricular size is normal. No increase in right ventricular wall thickness. Right ventricular systolic function is normal.  Left Atrium: Left atrial size was normal in size.  Right Atrium: Right atrial size was normal in size.  Pericardium: There is no evidence of pericardial effusion.  Mitral Valve: MVA P1/2t 2.18 cm2. The mitral valve is normal in structure. There is mild thickening of the mitral valve leaflet(s). There is mild calcification of the mitral valve leaflet(s). No evidence of mitral valve  regurgitation. Mild to moderate mitral valve stenosis. MV peak gradient, 13.9 mmHg. The mean mitral valve gradient is 4.5 mmHg.  Tricuspid Valve: The tricuspid valve is normal in structure. Tricuspid valve regurgitation is trivial. No evidence of tricuspid stenosis.  Aortic Valve: The aortic valve is calcified. There is mild calcification of the aortic valve. There is mild thickening of the aortic valve. Aortic valve regurgitation is mild. Aortic regurgitation PHT measures 519 msec. No aortic stenosis is present.  Pulmonic Valve: The pulmonic valve was normal in structure. Pulmonic valve regurgitation is not visualized. No evidence of pulmonic stenosis.  Aorta: The aortic root is normal in size and structure.  Venous: The inferior vena cava is normal in size with greater than 50% respiratory variability, suggesting right atrial pressure of 3 mmHg.  IAS/Shunts: No atrial level shunt detected by color flow Doppler.   LEFT VENTRICLE PLAX 2D LVIDd:         3.10 cm   Diastology LVIDs:         2.20 cm   LV e' medial:    3.70 cm/s LV PW:         1.10 cm   LV E/e' medial:  23.4 LV IVS:        1.10 cm   LV e' lateral:   5.44 cm/s LVOT diam:     1.80 cm   LV E/e' lateral: 15.9 LV SV:         55 LV SV Index:   33        2D Longitudinal Strain LVOT Area:     2.54 cm  2D Strain GLS Avg:     -14.2 %   RIGHT VENTRICLE RV Basal diam:  2.30 cm RV S prime:     14.70 cm/s TAPSE (M-mode): 1.4 cm  LEFT ATRIUM             Index        RIGHT ATRIUM           Index LA diam:        3.20 cm 1.90 cm/m   RA Area:     10.60 cm LA Vol (A2C):   38.2 ml 22.70 ml/m  RA Volume:   21.60 ml  12.83 ml/m LA Vol (A4C):   31.4 ml 18.66 ml/m LA Biplane Vol: 35.6 ml 21.15 ml/m AORTIC VALVE LVOT Vmax:  120.50 cm/s LVOT Vmean:  80.050 cm/s LVOT VTI:    0.218 m AI PHT:      519 msec  AORTA Ao Root diam: 2.70 cm Ao Desc diam: 3.40 cm  MITRAL VALVE                TRICUSPID VALVE MV Area (PHT): 2.18 cm      TR Peak grad:   9.6 mmHg MV Area VTI:   1.38 cm     TR Vmax:        155.00 cm/s MV Peak grad:  13.9 mmHg MV Mean grad:  4.5 mmHg     SHUNTS MV Vmax:       1.87 m/s     Systemic VTI:  0.22 m MV Vmean:      95.2 cm/s    Systemic Diam: 1.80 cm MV E velocity: 86.60 cm/s MV A velocity: 164.00 cm/s MV E/A ratio:  0.53  Gypsy Balsam MD Electronically signed by Gypsy Balsam MD Signature Date/Time: 01/25/2023/5:16:35 PM    Final    MONITORS  LONG TERM MONITOR (3-14 DAYS) 01/26/2023  Narrative   Sinus rhythm predominantly with average heart rate 80/min, rare supraventricular and ventricular ectopy burden.   Short runs of supraventricular tachycardia appear consistent with ectopic atrial runs, longest episode lasted 8 beats.   Two patient triggered events in total, correlated with normal sinus rhythm and rate.   Overall benign study.   Patch Wear Time:  7 days and 16 hours (2024-10-17T16:34:29-0400 to 2024-10-25T08:46:09-398)  Predominantly sinus rhythm. Average heart rate 80/min [ranging from 60 bpm to 143 bpm]. Rare supraventricular ectopy, less than 1% burden Rare ventricular ectopy, less than 1% burden. Short runs of supraventricular tachycardia occurred with the longest episode lasting 8 beats with an average rate of 113 bpm, these appear consistent with ectopic atrial tachycardia. Total patient triggers were two and reported without any reported symptoms, correlated with sinus rhythm with normal heart rate ranging from 71 bpm to 81 bpm Overall benign study.   CT SCANS  CT CORONARY MORPH W/CTA COR W/SCORE 01/19/2023  Narrative CLINICAL DATA:  Chest pain  EXAM: Cardiac/Coronary CTA  TECHNIQUE: A non-contrast, gated CT scan was obtained with axial slices of 3 mm through the heart for calcium scoring. Calcium scoring was performed using the Agatston method. A 120 kV prospective, gated, contrast cardiac scan was obtained. Gantry rotation speed was 250 msecs  and collimation was 0.6 mm. Two sublingual nitroglycerin tablets (0.8 mg) were given. The 3D data set was reconstructed in 5% intervals of the 35-75% of the R-R cycle. Diastolic phases were analyzed on a dedicated workstation using MPR, MIP, and VRT modes. The patient received 95 cc of contrast.  FINDINGS: Image quality: Good.  Noise artifact is: Limited.  Coronary Arteries:  Normal coronary origin.  Right dominance.  Left main: The left main is a large caliber vessel with a normal take off from the left coronary cusp that bifurcates to form a left anterior descending artery and a left circumflex artery. There is no plaque or stenosis.  Left anterior descending artery: The LAD gives off 2 patent diagonal branches. There is mild calcified plaque in the proximal LAD with associated stenosis of 25-49%. There is mild calcified plaque in a large D1 with associated stenosis of 25-49%. There is moderate mixed plaque in the mid LAD with associated stenosis of 50-69%.  Left circumflex artery: The LCX is non-dominant and gives off 3 patent obtuse marginal branches. There is mild mixed  plaque in the proximal and mid LCx and OM3 with associated stenosis of 25-49%.  Right coronary artery: The RCA is dominant with normal take off from the right coronary cusp. The RCA terminates as a PDA and right posterolateral branch. There is mild soft plaque in the ostium of the RCA with associated stenosis of at least 25-49% but could be >50%. There is minimal calcified plaque in the distal RCA with associated stenosis of < 25%.  Right Atrium: Right atrial size is within normal limits.  Right Ventricle: The right ventricular cavity is within normal limits.  Left Atrium: Left atrial size is normal in size with no left atrial appendage filling defect.  Left Ventricle: The ventricular cavity size is within normal limits.  Pulmonary arteries: Normal in size.  Pulmonary veins: Normal pulmonary venous  drainage.  Pericardium: Normal thickness without significant effusion or calcium present.  Cardiac valves: The aortic valve is trileaflet with calcifications and with AV calcium score of 174. The mitral valve is normal with mitral annular calcification.  Aorta: Normal caliber with scattered calcifications.  Extra-cardiac findings: See attached radiology report for non-cardiac structures.  IMPRESSION: 1. Coronary calcium score of 411. This was 83rd percentile for age-, sex, and race-matched controls.  2. Total plaque volume 366 mm3 which is 63rd percentile for age- and sex-matched controls (calcified plaque 81mm3; non-calcified plaque 310mm3). TPV is moderate.  3. Normal coronary origin with right dominance.  4. Moderate atherosclerosis. 25-49% prox LAD/D1; 50-69% mid LAD; 25-49% ostial RCA; 25-49% prox LCx/OM3.  5. Consider symptom-guided anti-ischemic and preventive pharmacotherapy as well as risk factor modification per guideline-directed care.  6. This study has been submitted for FFR analysis of the ostial RCA.  RECOMMENDATIONS: 1. CAD-RADS 0: No evidence of CAD (0%). Consider non-atherosclerotic causes of chest pain.  2. CAD-RADS 1: Minimal non-obstructive CAD (0-24%). Consider non-atherosclerotic causes of chest pain. Consider preventive therapy and risk factor modification.  3. CAD-RADS 2: Mild non-obstructive CAD (25-49%). Consider non-atherosclerotic causes of chest pain. Consider preventive therapy and risk factor modification.  4. CAD-RADS 3: Moderate stenosis. Consider symptom-guided anti-ischemic pharmacotherapy as well as risk factor modification per guideline directed care. Additional analysis with CT FFR will be submitted.  5. CAD-RADS 4: Severe stenosis. (70-99% or > 50% left main). Cardiac catheterization or CT FFR is recommended. Consider symptom-guided anti-ischemic pharmacotherapy as well as risk factor modification per guideline directed  care. Invasive coronary angiography recommended with revascularization per published guideline statements.  6. CAD-RADS 5: Total coronary occlusion (100%). Consider cardiac catheterization or viability assessment. Consider symptom-guided anti-ischemic pharmacotherapy as well as risk factor modification per guideline directed care.  7. CAD-RADS N: Non-diagnostic study. Obstructive CAD can't be excluded. Alternative evaluation is recommended.  Armanda Magic, MD   Electronically Signed By: Armanda Magic M.D. On: 01/19/2023 11:02          EKG:       Recent Labs: 05/04/2022: TSH 4.03 12/11/2022: ALT 31; Hemoglobin 14.5; Platelets 138 01/12/2023: BUN 22; Creatinine, Ser 1.08; Potassium 4.2; Sodium 146  Recent Lipid Panel    Component Value Date/Time   CHOL 112 05/04/2022 0909   TRIG 209.0 (H) 05/04/2022 0909   HDL 33.10 (L) 05/04/2022 0909   CHOLHDL 3 05/04/2022 0909   VLDL 41.8 (H) 05/04/2022 0909   LDLCALC 52 05/04/2021 0901   LDLDIRECT 55.0 05/04/2022 0909    Physical Exam:    VS:  BP 128/74   Pulse 90   Ht 5\' 2"  (1.575 m)   Wt  147 lb (66.7 kg)   LMP 03/20/1981   SpO2 93%   BMI 26.89 kg/m     Wt Readings from Last 3 Encounters:  02/05/23 147 lb (66.7 kg)  01/30/23 146 lb (66.2 kg)  01/04/23 145 lb 3.2 oz (65.9 kg)     GENERAL:  Well nourished, well developed in no acute distress NECK: No JVD; No carotid bruits CARDIAC: RRR, S1 and S2 present, no murmurs, no rubs, no gallops Extremities: No pitting pedal edema. Pulses bilaterally symmetric with radial 2+ and dorsalis pedis 2+ NEUROLOGIC:  Alert and oriented x 3  Medication Adjustments/Labs and Tests Ordered: Current medicines are reviewed at length with the patient today.  Concerns regarding medicines are outlined above.  No orders of the defined types were placed in this encounter.  Meds ordered this encounter  Medications   aspirin EC 81 MG tablet    Sig: Take 1 tablet (81 mg total) by mouth daily.  Swallow whole.    Signed, Cecille Amsterdam, MD, MPH, [facility]. 02/05/2023 2:58 PM    [facility]

## 2023-02-05 NOTE — Assessment & Plan Note (Signed)
Mildly increased mean gradient 4.5 mmHg, with valve area calculated by pressure half-time 2.18 cm on echocardiogram.  Will follow-up with repeat echocardiogram tentatively in 1 year.

## 2023-02-05 NOTE — Patient Instructions (Signed)
Medication Instructions:  Your physician has recommended you make the following change in your medication:   Start 81 mg coated aspirin daily.  *If you need a refill on your cardiac medications before your next appointment, please call your pharmacy*   Lab Work: None ordered If you have labs (blood work) drawn today and your tests are completely normal, you will receive your results only by: MyChart Message (if you have MyChart) OR A paper copy in the mail If you have any lab test that is abnormal or we need to change your treatment, we will call you to review the results.   Testing/Procedures: None ordered   Follow-Up: At [facility], you and your health needs are our priority.  As part of our continuing mission to provide you with exceptional heart care, we have created designated Provider Care Teams.  These Care Teams include your primary Cardiologist (physician) and Advanced Practice Providers (APPs -  Physician Assistants and Nurse Practitioners) who all work together to provide you with the care you need, when you need it.  We recommend signing up for the patient portal called "MyChart".  Sign up information is provided on this After Visit Summary.  MyChart is used to connect with patients for Virtual Visits (Telemedicine).  Patients are able to view lab/test results, encounter notes, upcoming appointments, etc.  Non-urgent messages can be sent to your provider as well.   To learn more about what you can do with MyChart, go to [email or web address].    Your next appointment:   Based on results  The format for your next appointment:   In Person  Provider:   Huntley Dec, MD    Other Instructions none  Important Information About Sugar

## 2023-02-12 NOTE — Addendum Note (Signed)
Addended by: Eleonore Chiquito on: 02/12/2023 05:00 PM   Modules accepted: Orders

## 2023-02-13 ENCOUNTER — Encounter: Payer: Self-pay | Admitting: Family Medicine

## 2023-02-13 DIAGNOSIS — H35352 Cystoid macular degeneration, left eye: Secondary | ICD-10-CM | POA: Diagnosis not present

## 2023-02-13 DIAGNOSIS — H35032 Hypertensive retinopathy, left eye: Secondary | ICD-10-CM | POA: Diagnosis not present

## 2023-02-13 DIAGNOSIS — H43392 Other vitreous opacities, left eye: Secondary | ICD-10-CM | POA: Diagnosis not present

## 2023-02-13 DIAGNOSIS — H43811 Vitreous degeneration, right eye: Secondary | ICD-10-CM | POA: Diagnosis not present

## 2023-02-13 DIAGNOSIS — H34832 Tributary (branch) retinal vein occlusion, left eye, with macular edema: Secondary | ICD-10-CM | POA: Diagnosis not present

## 2023-02-13 NOTE — Addendum Note (Signed)
Addended by: Huntley Dec REDDY on: 02/13/2023 03:37 PM   Modules accepted: Orders

## 2023-02-21 ENCOUNTER — Inpatient Hospital Stay (HOSPITAL_COMMUNITY): Admission: RE | Admit: 2023-02-21 | Payer: Medicare HMO | Source: Ambulatory Visit

## 2023-02-28 ENCOUNTER — Ambulatory Visit: Payer: Medicare HMO

## 2023-02-28 VITALS — Ht 62.0 in | Wt 147.0 lb

## 2023-02-28 DIAGNOSIS — E1165 Type 2 diabetes mellitus with hyperglycemia: Secondary | ICD-10-CM | POA: Diagnosis not present

## 2023-02-28 DIAGNOSIS — Z0000 Encounter for general adult medical examination without abnormal findings: Secondary | ICD-10-CM | POA: Diagnosis not present

## 2023-02-28 DIAGNOSIS — E1140 Type 2 diabetes mellitus with diabetic neuropathy, unspecified: Secondary | ICD-10-CM

## 2023-02-28 NOTE — Progress Notes (Signed)
Subjective:   Valerie Ball is a 77 y.o. female who presents for Medicare Annual (Subsequent) preventive examination.  Visit Complete: Virtual I connected with  [name] on 02/28/23 by a audio enabled telemedicine application and verified that I am speaking with the correct person using two identifiers.  Patient Location: Home  Provider Location: Home Office  I discussed the limitations of evaluation and management by telemedicine. The patient expressed understanding and agreed to proceed.  Vital Signs: Because this visit was a virtual/telehealth visit, some criteria may be missing or patient reported. Any vitals not documented were not able to be obtained and vitals that have been documented are patient reported.  Patient Medicare AWV questionnaire was completed by the patient on (not done); I have confirmed that all information answered by patient is correct and no changes since this date.  Cardiac Risk Factors include: advanced age (>27men, >16 women);diabetes mellitus;dyslipidemia;hypertension;sedentary lifestyle     Objective:    Today's Vitals   02/28/23 1506  Weight: 147 lb (66.7 kg)  Height: 5\' 2"  (1.575 m)   Body mass index is 26.89 kg/m.     02/28/2023    3:19 PM 08/15/2022   10:58 AM 03/10/2022    9:44 AM 08/12/2021   10:41 AM 05/16/2021    2:56 PM 03/08/2021    1:24 PM 10/28/2020    9:41 AM  Advanced Directives  Does Patient Have a Medical Advance Directive? No No No No No No No  Would patient like information on creating a medical advance directive?   No - Patient declined   Yes (MAU/Ambulatory/Procedural Areas - Information given) No - Patient declined    Current Medications (verified) Outpatient Encounter Medications as of 02/28/2023  Medication Sig   Alpha-Lipoic Acid 600 MG TABS Take 600 mg by mouth 2 (two) times daily.   amLODipine (NORVASC) 2.5 MG tablet Take 1 tablet (2.5 mg total) by mouth daily.   aspirin EC 81 MG tablet Take 1 tablet  (81 mg total) by mouth daily. Swallow whole.   Calcium Carbonate (CALCIUM 500 PO) Take 1 capsule by mouth daily.   Cholecalciferol (VITAMIN D3) 1000 units CAPS Take 1 capsule by mouth daily.   Cyanocobalamin (B-12) 1000 MCG TABS Take 1 tablet by mouth daily.   Dulaglutide (TRULICITY) 3 MG/0.5ML SOPN INJECT 0.5 ML (3 MG) UNDER THE SKIN ONCE A WEEK   empagliflozin (JARDIANCE) 25 MG TABS tablet Take 1 tablet (25 mg total) by mouth daily before breakfast.   ezetimibe (ZETIA) 10 MG tablet TAKE 1 TABLET DAILY   famotidine (PEPCID) 20 MG tablet Take 1 tablet (20 mg total) by mouth 2 (two) times daily.   FLUoxetine (PROZAC) 40 MG capsule Take 1 capsule (40 mg total) by mouth daily.   fluticasone (FLONASE) 50 MCG/ACT nasal spray USE 2 SPRAYS IN EACH NOSTRIL DAILY AS NEEDED FOR ALLERGIES   hydrochlorothiazide (HYDRODIURIL) 25 MG tablet TAKE 1 TABLET DAILY   levothyroxine (SYNTHROID) 50 MCG tablet TAKE 1 TABLET DAILY BEFORE BREAKFAST   losartan (COZAAR) 100 MG tablet TAKE 1 TABLET DAILY   metoprolol tartrate (LOPRESSOR) 50 MG tablet Please take 50 mg ( 1 tablet) the night before and 50 mg ( 1 tablet) the morning of your CT   Multiple Vitamin (MULTIVITAMIN WITH MINERALS) TABS tablet Take 1 tablet by mouth daily.   omeprazole (PRILOSEC) 20 MG capsule TAKE 1 CAPSULE DAILY   repaglinide (PRANDIN) 1 MG tablet TAKE 1 TABLET DAILY BEFORE SUPPER   rOPINIRole (REQUIP) 1  MG tablet TAKE 1 TABLET AT BEDTIME   rosuvastatin (CRESTOR) 10 MG tablet TAKE 1 TABLET TWICE A WEEK   No facility-administered encounter medications on file as of 02/28/2023.   Allergies (verified) Arimidex [anastrozole], Glipizide, Metformin and related, and Penicillins   History: Past Medical History:  Diagnosis Date   Allergic rhinitis 08/22/2006   Qualifier: Diagnosis of   By: Lindwood Qua CMA, Nicanor Bake SNOMED Dx Update Oct 2024     Allergy    allegic rhinitis   Alternating constipation and diarrhea 08/12/2015   Anemia     Angiodysplasia of colon    Anxiety    Arthritis    AVM (arteriovenous malformation) of colon    Bilateral knee pain 05/11/2022   Blurred vision, right eye 08/11/2021   Patient does have blurred vision right eye,     Borderline diabetic    per patient medical history form   Branch retinal vein occlusion with macular edema of left eye 07/14/2019   CME superior to the fovea OS has worsened at 7-week interval from macular branch retinal vein occlusion, repeat Avastin today and evaluate exam in 6 weeks     Breast cancer (HCC) 02/2009   breast CA L invasive ductal CA(hormone receptor positive.)   Chest pain 12/29/2022   Colon cancer screening 01/23/2014   Colon polyp    hyperplastic   Colon stricture (HCC) 01/14/2003   Cystoid macular edema of left eye 07/14/2019   Decreased calculated GFR 05/11/2022   Diabetic gastroparesis (HCC) 03/27/2016   Typical symptoms -suspect      No detectable diabetic retinopathy 12-27-2020     Diabetic neuropathy (HCC) 11/25/2013   Diaphragmatic hernia 03/02/2008   Qualifier: Diagnosis of   By: Koleen Distance CMA Duncan Dull), Leisha      IMO SNOMED Dx Update Oct 2024     Diverticulitis of colon 10/14/2014   Diverticulosis    Dizziness 01/07/2014   Dyspnea on exertion 01/04/2023   Encounter for Medicare annual wellness exam 01/23/2014   ESOPHAGEAL STRICTURE 03/02/2008   Qualifier: Diagnosis of   By: Koleen Distance CMA (AAMA), Hulan Saas         Essential hypertension 08/22/2006   Qualifier: Diagnosis of   By: Lindwood Qua CMA, Jerl Santos         Estrogen deficiency 01/23/2014   Fatigue 05/20/2018   Fatty liver    Fever, low grade 09/24/2020   GAD (generalized anxiety disorder) 04/15/2007   Qualifier: Diagnosis of   By: Jillyn Hidden FNP, Mcarthur Rossetti        GI bleed    Gout    Hepatic steatosis    Hiatal hernia    Hip pain, bilateral 08/31/2017   History of breast cancer 08/02/2009   Qualifier: Diagnosis of   By: [name] MD, Colon Flattery        History of COVID-19 09/24/2020    History of removal of implants of both breasts 07/01/2013   06/25/13 elected to have bilateral breast implants removed.     History of seizure 08/22/2006   Qualifier: Diagnosis of   By: Lindwood Qua CMA, Jerl Santos         Hyperlipidemia    Hyperlipidemia associated with type 2 diabetes mellitus (HCC) 08/22/2006   Qualifier: Diagnosis of   By: Lindwood Qua CMA, Jerl Santos         Hypertension    Hypertensive retinopathy of left eye 07/14/2019   Hypothyroidism    IBS (irritable bowel syndrome)    Lap Nissen  October 2013 01/19/2012   Laparoscopic takedown of large type III mixed hiatus hernia with primary closure of the diaphragm with 3 sutures posteriorally (post 2 with pledgets) and Nissen fundoplication over a #56 lighted bougie        Large type III mixed hiatus hernia  12/06/2011   Plan surgery to repair diaphragm and do Nissen fundoplication     Macular edema    left eye   Melena 01/2008   anemia transfusion   Menopause    per patient medical history form   Palpitations 01/04/2023   Peritoneal adhesions 03/02/2008   Qualifier: Diagnosis of   By: Koleen Distance CMA Duncan Dull), Leisha      IMO SNOMED Dx Update Oct 2024     Poor balance 01/30/2023   Poorly controlled type 2 diabetes mellitus with neuropathy (HCC) 08/19/2007   Qualifier: Diagnosis of   By: [name] MD, Colon Flattery        Posterior vitreous detachment of right eye 11/06/2019   Postmenopausal hormone therapy    per patient medical history form.   Retinal vein occlusion, branch 01/03/2016   Left with hemorrhage and edema (Dr Luciana Axe) 2017     Routine general medical examination at a health care facility 07/11/2011   Salmonella 05/2000   renal effects secondary to dehydration   Seizure disorder (HCC)    Seizures (HCC)    one seizure several yrs ago --etiology unknown-no problem since   Stress reaction 10/25/2011   SYNDROME, CARPAL TUNNEL 08/23/2006   Qualifier: Diagnosis of   By: [name] MD, Colon Flattery        SYNDROME, RESTLESS LEGS  08/23/2006   Qualifier: Diagnosis of   By: [name] MD, Colon Flattery        Thrombocytopenia (HCC) 11/25/2013   TRANSAMINASES, SERUM, ELEVATED 11/11/2009   Qualifier: Diagnosis of   By: [name] MD, Colon Flattery        Tremor 01/20/2014   Head and right hand      Vitamin B12 deficiency 09/21/2016   Vitreous floaters of left eye 07/14/2019   The nature of vitreous floaters was discussed with the patient as well as their frequent occurrence with development of posterior vitreous detachment. The significance of flashes and field cuts was discussed with the patient. The need for a dilated fundus examination was discussed and advice given to return immediately for new or different floaters .  More uncommonly, vitreous floaters, debris, or   Past Surgical History:  Procedure Laterality Date   ABDOMINAL HYSTERECTOMY  1983   BREAST SURGERY  02/2009   breast biopsy invasive ductal CA-bilateral mastectomies   EPIGASTRIC HERNIA REPAIR  01/02/2012   Procedure: HERNIA REPAIR EPIGASTRIC ADULT;  Surgeon: [name], MD;  Location: [facility];  Service: General;  Laterality: N/A;  Laparoscopic Repair Paraesophageal Hernia, Nissen   EYE SURGERY Left 07/2017   Dr. Elwyn Lade CYST EXCISION     LAPAROSCOPIC NISSEN FUNDOPLICATION  01/02/2012   Procedure: LAPAROSCOPIC NISSEN FUNDOPLICATION;  Surgeon: [name], MD;  Location: [facility];  Service: General;  Laterality: N/A;   MASTECTOMY  04/2009   Bilateral for ductal carcinoma on L   OVARY SURGERY  1986   ovarian tumor removed   TUBAL LIGATION     Family History  Problem Relation Age of Onset   Hypertension Mother    Stroke Mother    Breast cancer Mother    Heart attack Father    Diabetes Father    Esophageal cancer Father  Breast cancer Sister        x 2 sisters   Diabetes Sister    Hypertension Sister    Breast cancer Sister    Hypertension Brother    Diabetes Brother    Social History   Socioeconomic History   Marital status: Married     Spouse name: Not on file   Number of children: 3   Years of education: Not on file   Highest education level: Not on file  Occupational History   Occupation: retired    Associate Professor: UNEMPLOYED  Tobacco Use   Smoking status: Never   Smokeless tobacco: Never  Vaping Use   Vaping status: Never Used  Substance and Sexual Activity   Alcohol use: Yes    Comment: occasional   Drug use: Never   Sexual activity: Not Currently  Other Topics Concern   Not on file  Social History Narrative   ** Merged History Encounter **       1 cup of coffee every morning   Social Determinants of Health   Financial Resource Strain: Low Risk  (02/28/2023)   Overall Financial Resource Strain (CARDIA)    Difficulty of Paying Living Expenses: Not hard at all  Food Insecurity: No Food Insecurity (02/28/2023)   Hunger Vital Sign    Worried About Running Out of Food in the Last Year: Never true    Ran Out of Food in the Last Year: Never true  Transportation Needs: No Transportation Needs (02/28/2023)   PRAPARE - Administrator, Civil Service (Medical): No    Lack of Transportation (Non-Medical): No  Physical Activity: Inactive (02/28/2023)   Exercise Vital Sign    Days of Exercise per Week: 0 days    Minutes of Exercise per Session: 0 min  Stress: Stress Concern Present (02/28/2023)   [employer] of Occupational Health - Occupational Stress Questionnaire    Feeling of Stress : Rather much  Social Connections: Socially Integrated (02/28/2023)   Social Connection and Isolation Panel [NHANES]    Frequency of Communication with Friends and Family: Twice a week    Frequency of Social Gatherings with Friends and Family: Once a week    Attends Religious Services: More than 4 times per year    Active Member of [employer] or Organizations: Yes    Attends Banker Meetings: 1 to 4 times per year    Marital Status: Married    Tobacco Counseling Counseling given: Not  Answered  Clinical Intake:  Pre-visit preparation completed: Yes  Pain : No/denies pain   BMI - recorded: 26.89 Nutritional Status: BMI 25 -29 Overweight Nutritional Risks: None Diabetes: Yes CBG done?: No Did pt. bring in CBG monitor from home?: No  How often do you need to have someone help you when you read instructions, pamphlets, or other written materials from your doctor or pharmacy?: 1 - Never  Interpreter Needed?: No  Comments: lives w/husband Information entered by :: B.Jameek Bruntz,LPN  Activities of Daily Living    02/28/2023    3:18 PM 03/10/2022    9:25 AM  In your present state of health, do you have any difficulty performing the following activities:  Hearing? 0 0  Vision? 1 1  Difficulty concentrating or making decisions? 0 1  Walking or climbing stairs? 1 1  Dressing or bathing? 0 0  Doing errands, shopping? 0 0  Preparing Food and eating ? N N  Using the Toilet? N N  In the past six months,  have you accidently leaked urine? N Y  Do you have problems with loss of bowel control? Y N  Managing your Medications? N N  Managing your Finances? N N  Housekeeping or managing your Housekeeping? N Y    Patient Care Team: Tower, Audrie Gallus, MD as PCP - General (Family Medicine) Luciana Axe Alford Highland, MD as Consulting Physician (Ophthalmology)  Indicate any recent Medical Services you may have received from other than [facility] providers in the past year (date may be approximate).     Assessment:   This is a routine wellness examination for [place].  Hearing/Vision screen Hearing Screening - Comments:: Pt says her hearing good Vision Screening - Comments:: Pt says her vision is not very good:cannot see straight forward in lft eye Injections in lft eye every 6 weeks Dr Luciana Axe   Goals Addressed             This Visit's Progress    Increase physical activity   Not on track    Patient Stated   On track    Starting 09/09/18, I will continue to take medication as  prescribed.      Patient Stated   On track    03/05/2020, I will maintain and continue medications as prescribed.        Depression Screen    02/28/2023    3:15 PM 01/30/2023   11:05 AM 12/29/2022   11:13 AM 05/11/2022   11:38 AM 03/10/2022    9:38 AM 03/08/2021    1:27 PM 03/05/2020    1:19 PM  PHQ 2/9 Scores  PHQ - 2 Score 6 6 4 1  0 1 0  PHQ- 9 Score 12 14 16 13    0    Fall Risk    02/28/2023    3:11 PM 01/30/2023   11:05 AM 12/29/2022   11:13 AM 05/11/2022   11:38 AM 03/10/2022    9:24 AM  Fall Risk   Falls in the past year? 0 0 1 0 0  Number falls in past yr: 1 0 0 0 0  Injury with Fall? 0 0 0 0 0  Risk for fall due to : No Fall Risks No Fall Risks History of fall(s) No Fall Risks   Follow up Education provided;Falls prevention discussed Falls evaluation completed Falls evaluation completed Falls evaluation completed Falls evaluation completed;Falls prevention discussed    MEDICARE RISK AT HOME: Medicare Risk at Home Any stairs in or around the home?: Yes If so, are there any without handrails?: Yes Home free of loose throw rugs in walkways, pet beds, electrical cords, etc?: Yes Adequate lighting in your home to reduce risk of falls?: Yes Life alert?: No (smart watch) Use of a cane, walker or w/c?: No Grab bars in the bathroom?: Yes Shower chair or bench in shower?: Yes Elevated toilet seat or a handicapped toilet?: No  TIMED UP AND GO:  Was the test performed?  No    Cognitive Function:    03/05/2020    1:26 PM 09/06/2018   11:05 AM 08/31/2017   10:49 AM 08/22/2016    2:04 PM  MMSE - Mini Mental State Exam  Orientation to time 5 5 5 5   Orientation to Place 5 5 5 5   Registration 3 3 3 3   Attention/ Calculation 5 0 0 0  Recall 3 3 3 3   Language- name 2 objects  0 0 0  Language- repeat 1 1 1 1   Language- follow 3 step command  0 3  3  Language- read & follow direction  0 0 0  Write a sentence  0 0 0  Copy design  0 0 0  Total score  17 20 20          02/28/2023    3:20 PM 03/10/2022    9:26 AM  6CIT Screen  What Year? 0 points 0 points  What month? 0 points 0 points  What time? 0 points 0 points  Count back from 20 0 points 0 points  Months in reverse 0 points 0 points  Repeat phrase 0 points 0 points  Total Score 0 points 0 points    Immunizations Immunization History  Administered Date(s) Administered   Fluad Quad(high Dose 65+) 11/19/2018   Fluad Trivalent(High Dose 65+) 12/29/2022   Influenza Split 01/17/2011   Influenza Whole 04/04/2005, 01/22/2009   Influenza, High Dose Seasonal PF 01/10/2018, 01/02/2020   Influenza,inj,Quad PF,6+ Mos 12/06/2012, 11/25/2013, 01/26/2015, 11/12/2015, 03/29/2017   Influenza-Unspecified 12/19/2019, 01/01/2021   PFIZER(Purple Top)SARS-COV-2 Vaccination 04/21/2019, 05/19/2019, 12/19/2019   Pneumococcal Conjugate-13 01/26/2015   Pneumococcal Polysaccharide-23 02/10/2008, 01/23/2014   Tdap 07/19/2011   Zoster Recombinant(Shingrix) 01/10/2018, 04/21/2018    TDAP status: Up to date  Flu Vaccine status: Up to date  Pneumococcal vaccine status: Up to date  Covid-19 vaccine status: Completed vaccines  Qualifies for Shingles Vaccine? Yes   Zostavax completed Yes   Shingrix Completed?: Yes  Screening Tests Health Maintenance  Topic Date Due   HEMOGLOBIN A1C  10/31/2022   DTaP/Tdap/Td (2 - Td or Tdap) 12/29/2023 (Originally 07/18/2021)   COVID-19 Vaccine (4 - 2023-24 season) 01/14/2024 (Originally 11/19/2022)   Diabetic kidney evaluation - Urine ACR  05/12/2027 (Originally 07/24/2010)   FOOT EXAM  05/03/2023   OPHTHALMOLOGY EXAM  10/31/2023   Diabetic kidney evaluation - eGFR measurement  01/12/2024   Medicare Annual Wellness (AWV)  02/28/2024   Colonoscopy  03/24/2024   Pneumonia Vaccine 65+ Years old  Completed   INFLUENZA VACCINE  Completed   DEXA SCAN  Completed   Hepatitis C Screening  Completed   Zoster Vaccines- Shingrix  Completed   HPV VACCINES  Aged Out    Health  Maintenance  Health Maintenance Due  Topic Date Due   HEMOGLOBIN A1C  10/31/2022    Colorectal cancer screening: No longer required.   Mammogram status: No longer required due to age.  Bone Density status: Completed 05/11/22. Results reflect: Bone density results: NORMAL. Repeat every 5 years.  Lung Cancer Screening: (Low Dose CT Chest recommended if Age 86-80 years, 20 pack-year currently smoking OR have quit w/in 15years.) does not qualify.   Lung Cancer Screening Referral: no  Additional Screening:  Hepatitis C Screening: does not qualify; Completed no  Vision Screening: Recommended annual ophthalmology exams for early detection of glaucoma and other disorders of the eye. Is the patient up to date with their annual eye exam?  Yes  Who is the provider or what is the name of the office in which the patient attends annual eye exams? Dr Luciana Axe If pt is not established with a provider, would they like to be referred to a provider to establish care? No .   Dental Screening: Recommended annual dental exams for proper oral hygiene  Diabetic Foot Exam: Diabetic Foot Exam: Completed 05/02/22  Community Resource Referral / Chronic Care Management: CRR required this visit?  No   CCM required this visit?  No    Plan:    I have personally reviewed and noted the following in  the patient's chart:   Medical and social history Use of alcohol, tobacco or illicit drugs  Current medications and supplements including opioid prescriptions. Patient is not currently taking opioid prescriptions. Functional ability and status Nutritional status Physical activity Advanced directives List of other physicians Hospitalizations, surgeries, and ER visits in previous 12 months Vitals Screenings to include cognitive, depression, and falls Referrals and appointments  In addition, I have reviewed and discussed with patient certain preventive protocols, quality metrics, and best practice  recommendations. A written personalized care plan for preventive services as well as general preventive health recommendations were provided to patient.    Sue Lush, LPN   [id number]   After Visit Summary: (MyChart) Due to this being a telephonic visit, the after visit summary with patients personalized plan was offered to patient via MyChart   Nurse Notes: The patient states she is doing alright. She re[ports low energy due to "heart problems" . She has no concerns or questions at this time.

## 2023-02-28 NOTE — Patient Instructions (Signed)
Ms. Gropp , Thank you for taking time to come for your Medicare Wellness Visit. I appreciate your ongoing commitment to your health goals. Please review the following plan we discussed and let me know if I can assist you in the future.   Referrals/Orders/Follow-Ups/Clinician Recommendations: none  This is a list of the screening recommended for you and due dates:  Health Maintenance  Topic Date Due   Hemoglobin A1C  10/31/2022   DTaP/Tdap/Td vaccine (2 - Td or Tdap) 12/29/2023*   COVID-19 Vaccine (4 - 2023-24 season) 01/14/2024*   Yearly kidney health urinalysis for diabetes  05/12/2027*   Complete foot exam   05/03/2023   Eye exam for diabetics  10/31/2023   Yearly kidney function blood test for diabetes  01/12/2024   Medicare Annual Wellness Visit  02/28/2024   Colon Cancer Screening  03/24/2024   Pneumonia Vaccine  Completed   Flu Shot  Completed   DEXA scan (bone density measurement)  Completed   Hepatitis C Screening  Completed   Zoster (Shingles) Vaccine  Completed   HPV Vaccine  Aged Out  *Topic was postponed. The date shown is not the original due date.    Advanced directives: (Declined) Advance directive discussed with you today. Even though you declined this today, please call our office should you change your mind, and we can give you the proper paperwork for you to fill out.  Next Medicare Annual Wellness Visit scheduled for next year: Yes 02/29/2024 @ 3pm telephone

## 2023-03-04 NOTE — H&P (View-Only) (Signed)
Cardiology Consultation:    Date:  03/05/2023   ID:  [name], DOB 18-Jul-1945, MRN [id number]  PCP:  Valerie Pimple, MD  Cardiologist:  Valerie Corporal Ashanti Ratti, MD   Referring MD: Valerie Pimple, MD   No chief complaint on file.    ASSESSMENT AND PLAN:   Ms. Seyfried 77 year old woman with moderate coronary artery disease noted on CT coronary angiogram January 19, 2023 with poorly modeled LAD by FFR, mild mitral stenosis, mild aortic insufficiency, hypertension, hyperlipidemia, hypothyroidism, irritable bowel syndrome, hiatal hernia s/p unsuccessful repair, seizure disorder, chronic thrombocytopenia, gastroparesis, NASH, chronic dizziness, chronic microvascular ischemic changes on CT head September 2024, hemorrhoids, palpitations with no major abnormalities on event monitor October 2024, with progressive symptoms of dyspnea on exertion and delay in obtaining cardiac PET/CT now seen for follow-up visit.  Problem List Items Addressed This Visit     Hyperlipidemia associated with type 2 diabetes mellitus (HCC)   Tolerating rosuvastatin well, continue with rosuvastatin 10 mg once daily.   Continue Zetia 10 mg once a day.       Mitral stenosis and aortic incompetence, by TTE 01/25/2023 - Primary   Reviewed findings once again today.       CAD (coronary artery disease)  CT Cardiac 01/19/2023 calcium score 411, plaque volume 366, moderate stenosis LAD, mild stenosis D1, RCA, LCx/OM 3   Continue with aspirin 81 mg once daily. Also continues on rosuvastatin.  For antianginal regimen she is on amlodipine 2.5 mg once daily.  Further discussed invasive approach to assess for CAD, specifically her moderate obstructive disease of LAD noted on CT imaging and to evaluate if it is hemodynamically significant.  Shared Decision Making/Informed Consent{ The risks [stroke (1 in 1000), death (1 in 1000), kidney failure [usually temporary] (1 in 500), bleeding (1 in 200), allergic reaction  [possibly serious] (1 in 200)], benefits (diagnostic support and management of coronary artery disease) and alternatives of a cardiac catheterization were discussed in detail with her and her husband and she is willing to proceed.  Will schedule for cardiac catheterization at [facility].  Advised to avoid any moderate to heavy exertion. If symptoms are further worsening or occurring at rest should call 911 or have someone drive her to the ER.          Relevant Orders   EKG 12-Lead (Completed)   Basic metabolic panel   CBC   Further follow-up in the office based on test results from the cath.   History of Present Illness:    Valerie Ball is a 77 y.o. female who is being seen today for follow-up visit. Last visit with [place] in the office was 02/05/2023. PCP is [facility], Audrie Gallus, MD.   Pleasant woman here for the visit accompanied by her husband.  Has history of moderate coronary atherosclerosis with moderate mid LAD stenosis with mid to distal LAD not modeled by CT FFR 01/19/2023, mild mitral stenosis [mean gradient 4.5 mmHg, MVA 2.18 cm by TTE 01/25/2023], mild aortic insufficiency, diabetes mellitus, hypertension, hyperlipidemia, hypothyroidism, irritable bowel syndrome, hiatal hernia s/p unsuccessful repair, seizure disorder, chronic thrombocytopenia, gastroparesis, nonalcoholic steatohepatitis, chronic dizziness, chronic microvascular ischemic changes on CT head September 2024, hemorrhoids, palpitations with no major abnormalities on event monitor October 2024 [short 8 beat run of SVT consistent with ectopic atrial beats], with progressive symptoms of dyspnea on exertion, further ischemic evaluation with cardiac PET stress was requested, was started on antianginal regimen with amlodipine 2.5 mg once daily.  Her cardiac PET/CT stress test has not yet been done and currently scheduled for Wednesday this week.  She feels her situation is progressively worsening. She reports  shortness of breath and chest heaviness with minimal activities at home walking to the kitchen.  No symptoms at rest. Has been compliant with all her medications. Blood pressures have been well-controlled.  Scheduled this visit to further discuss her progression of symptoms and if continue to wait for cardiac PET/CT stress test to be completed or further evaluate with cardiac catheterization.   Past Medical History:  Diagnosis Date   Allergic rhinitis 08/22/2006   Qualifier: Diagnosis of   By: Lindwood Qua CMA, Jerl Santos      IMO SNOMED Dx Update Oct 2024     Allergy    allegic rhinitis   Alternating constipation and diarrhea 08/12/2015   Anemia    Angiodysplasia of colon    Anxiety    Arthritis    AVM (arteriovenous malformation) of colon    Bilateral knee pain 05/11/2022   Blurred vision, right eye 08/11/2021   Patient does have blurred vision right eye,     Borderline diabetic    per patient medical history form   Branch retinal vein occlusion with macular edema of left eye 07/14/2019   CME superior to the fovea OS has worsened at 7-week interval from macular branch retinal vein occlusion, repeat Avastin today and evaluate exam in 6 weeks     Breast cancer (HCC) 02/2009   breast CA L invasive ductal CA(hormone receptor positive.)   CAD (coronary artery disease)  CT Cardiac 01/19/2023 calcium score 411, plaque volume 366, moderate stenosis LAD, mild stenosis D1, RCA, LCx/OM 3 02/01/2023   Chest pain 12/29/2022   Colon cancer screening 01/23/2014   Colon polyp    hyperplastic   Colon stricture (HCC) 01/14/2003   Cystoid macular edema of left eye 07/14/2019   Decreased calculated GFR 05/11/2022   Diabetic gastroparesis (HCC) 03/27/2016   Typical symptoms -suspect      No detectable diabetic retinopathy 12-27-2020     Diabetic neuropathy (HCC) 11/25/2013   Diaphragmatic hernia 03/02/2008   Qualifier: Diagnosis of   By: Koleen Distance CMA Duncan Dull), Leisha      IMO SNOMED Dx Update Oct 2024      Diverticulitis of colon 10/14/2014   Diverticulosis    Dizziness 01/07/2014   Dyspnea on exertion 01/04/2023   Encounter for Medicare annual wellness exam 01/23/2014   ESOPHAGEAL STRICTURE 03/02/2008   Qualifier: Diagnosis of   By: Koleen Distance CMA (AAMA), Hulan Saas         Essential hypertension 08/22/2006   Qualifier: Diagnosis of   By: Lindwood Qua CMA, Jerl Santos         Estrogen deficiency 01/23/2014   Fatigue 05/20/2018   Fatty liver    Fever, low grade 09/24/2020   GAD (generalized anxiety disorder) 04/15/2007   Qualifier: Diagnosis of   By: Jillyn Hidden FNP, Mcarthur Rossetti        GI bleed    Gout    Hepatic steatosis    Hiatal hernia    Hip pain, bilateral 08/31/2017   History of breast cancer 08/02/2009   Qualifier: Diagnosis of   By: [name] MD, Colon Flattery        History of COVID-19 09/24/2020   History of removal of implants of both breasts 07/01/2013   06/25/13 elected to have bilateral breast implants removed.     History of seizure 08/22/2006   Qualifier: Diagnosis of  By: Lindwood Qua CMA, Jerl Santos         Hyperlipidemia    Hyperlipidemia associated with type 2 diabetes mellitus (HCC) 08/22/2006   Qualifier: Diagnosis of   By: Lindwood Qua CMA, Jerl Santos         Hypertension    Hypertensive retinopathy of left eye 07/14/2019   Hypothyroidism    IBS (irritable bowel syndrome)    Lap Nissen October 2013 01/19/2012   Laparoscopic takedown of large type III mixed hiatus hernia with primary closure of the diaphragm with 3 sutures posteriorally (post 2 with pledgets) and Nissen fundoplication over a #56 lighted bougie        Large type III mixed hiatus hernia  12/06/2011   Plan surgery to repair diaphragm and do Nissen fundoplication     Macular edema    left eye   Melena 01/2008   anemia transfusion   Menopause    per patient medical history form   Mitral stenosis and aortic incompetence, by TTE 01/25/2023 02/01/2023   Palpitations 01/04/2023   Peritoneal adhesions 03/02/2008    Qualifier: Diagnosis of   By: Koleen Distance CMA Duncan Dull), Leisha      IMO SNOMED Dx Update Oct 2024     Poor balance 01/30/2023   Poorly controlled type 2 diabetes mellitus with neuropathy (HCC) 08/19/2007   Qualifier: Diagnosis of   By: [name] MD, Colon Flattery        Posterior vitreous detachment of right eye 11/06/2019   Postmenopausal hormone therapy    per patient medical history form.   Retinal vein occlusion, branch 01/03/2016   Left with hemorrhage and edema (Dr Luciana Axe) 2017     Routine general medical examination at a health care facility 07/11/2011   Salmonella 05/2000   renal effects secondary to dehydration   Seizure disorder (HCC)    Seizures (HCC)    one seizure several yrs ago --etiology unknown-no problem since   Stress reaction 10/25/2011   SYNDROME, CARPAL TUNNEL 08/23/2006   Qualifier: Diagnosis of   By: [name] MD, Colon Flattery        SYNDROME, RESTLESS LEGS 08/23/2006   Qualifier: Diagnosis of   By: [name] MD, Colon Flattery        Thrombocytopenia (HCC) 11/25/2013   TRANSAMINASES, SERUM, ELEVATED 11/11/2009   Qualifier: Diagnosis of   By: [name] MD, Colon Flattery        Tremor 01/20/2014   Head and right hand      Vitamin B12 deficiency 09/21/2016   Vitreous floaters of left eye 07/14/2019   The nature of vitreous floaters was discussed with the patient as well as their frequent occurrence with development of posterior vitreous detachment. The significance of flashes and field cuts was discussed with the patient. The need for a dilated fundus examination was discussed and advice given to return immediately for new or different floaters .  More uncommonly, vitreous floaters, debris, or    Past Surgical History:  Procedure Laterality Date   ABDOMINAL HYSTERECTOMY  1983   BREAST SURGERY  02/2009   breast biopsy invasive ductal CA-bilateral mastectomies   EPIGASTRIC HERNIA REPAIR  01/02/2012   Procedure: HERNIA REPAIR EPIGASTRIC ADULT;  Surgeon: [name], MD;  Location: [facility];   Service: General;  Laterality: N/A;  Laparoscopic Repair Paraesophageal Hernia, Nissen   EYE SURGERY Left 07/2017   Dr. Elwyn Lade CYST EXCISION     LAPAROSCOPIC NISSEN FUNDOPLICATION  01/02/2012   Procedure: LAPAROSCOPIC NISSEN FUNDOPLICATION;  Surgeon: Thornton Park  Daphine Deutscher, MD;  Location: [facility];  Service: General;  Laterality: N/A;   MASTECTOMY  04/2009   Bilateral for ductal carcinoma on L   OVARY SURGERY  1986   ovarian tumor removed   TUBAL LIGATION      Current Medications: Current Meds  Medication Sig   Alpha-Lipoic Acid 600 MG TABS Take 600 mg by mouth 2 (two) times daily.   amLODipine (NORVASC) 2.5 MG tablet Take 1 tablet (2.5 mg total) by mouth daily.   aspirin EC 81 MG tablet Take 1 tablet (81 mg total) by mouth daily. Swallow whole.   Calcium Carbonate (CALCIUM 500 PO) Take 1 capsule by mouth daily.   Cholecalciferol (VITAMIN D3) 1000 units CAPS Take 1 capsule by mouth daily.   Cyanocobalamin (B-12) 1000 MCG TABS Take 1 tablet by mouth daily.   Dulaglutide (TRULICITY) 3 MG/0.5ML SOPN INJECT 0.5 ML (3 MG) UNDER THE SKIN ONCE A WEEK   empagliflozin (JARDIANCE) 25 MG TABS tablet Take 1 tablet (25 mg total) by mouth daily before breakfast.   ezetimibe (ZETIA) 10 MG tablet TAKE 1 TABLET DAILY   famotidine (PEPCID) 20 MG tablet Take 1 tablet (20 mg total) by mouth 2 (two) times daily.   FLUoxetine (PROZAC) 40 MG capsule Take 1 capsule (40 mg total) by mouth daily.   fluticasone (FLONASE) 50 MCG/ACT nasal spray USE 2 SPRAYS IN EACH NOSTRIL DAILY AS NEEDED FOR ALLERGIES   hydrochlorothiazide (HYDRODIURIL) 25 MG tablet TAKE 1 TABLET DAILY   levothyroxine (SYNTHROID) 50 MCG tablet TAKE 1 TABLET DAILY BEFORE BREAKFAST   losartan (COZAAR) 100 MG tablet TAKE 1 TABLET DAILY   metoprolol tartrate (LOPRESSOR) 50 MG tablet Please take 50 mg ( 1 tablet) the night before and 50 mg ( 1 tablet) the morning of your CT   Multiple Vitamin (MULTIVITAMIN WITH MINERALS) TABS tablet Take 1 tablet  by mouth daily.   omeprazole (PRILOSEC) 20 MG capsule TAKE 1 CAPSULE DAILY   repaglinide (PRANDIN) 1 MG tablet TAKE 1 TABLET DAILY BEFORE SUPPER   rOPINIRole (REQUIP) 1 MG tablet TAKE 1 TABLET AT BEDTIME   rosuvastatin (CRESTOR) 10 MG tablet TAKE 1 TABLET TWICE A WEEK     Allergies:   Arimidex [anastrozole], Glipizide, Metformin and related, and Penicillins   Social History   Socioeconomic History   Marital status: Married    Spouse name: Not on file   Number of children: 3   Years of education: Not on file   Highest education level: Not on file  Occupational History   Occupation: retired    Associate Professor: UNEMPLOYED  Tobacco Use   Smoking status: Never   Smokeless tobacco: Never  Vaping Use   Vaping status: Never Used  Substance and Sexual Activity   Alcohol use: Yes    Comment: occasional   Drug use: Never   Sexual activity: Not Currently  Other Topics Concern   Not on file  Social History Narrative   ** Merged History Encounter **       1 cup of coffee every morning   Social Drivers of Health   Financial Resource Strain: Low Risk  (02/28/2023)   Overall Financial Resource Strain (CARDIA)    Difficulty of Paying Living Expenses: Not hard at all  Food Insecurity: No Food Insecurity (02/28/2023)   Hunger Vital Sign    Worried About Running Out of Food in the Last Year: Never true    Ran Out of Food in the Last Year: Never true  Transportation Needs:  No Transportation Needs (02/28/2023)   PRAPARE - Administrator, Civil Service (Medical): No    Lack of Transportation (Non-Medical): No  Physical Activity: Inactive (02/28/2023)   Exercise Vital Sign    Days of Exercise per Week: 0 days    Minutes of Exercise per Session: 0 min  Stress: Stress Concern Present (02/28/2023)   [employer] of Occupational Health - Occupational Stress Questionnaire    Feeling of Stress : Rather much  Social Connections: Socially Integrated (02/28/2023)   Social Connection  and Isolation Panel [NHANES]    Frequency of Communication with Friends and Family: Twice a week    Frequency of Social Gatherings with Friends and Family: Once a week    Attends Religious Services: More than 4 times per year    Active Member of [employer] or Organizations: Yes    Attends Engineer, structural: 1 to 4 times per year    Marital Status: Married     Family History: The patient's family history includes Breast cancer in her mother, sister, and sister; Diabetes in her brother, father, and sister; Esophageal cancer in her father; Heart attack in her father; Hypertension in her brother, mother, and sister; Stroke in her mother. ROS:   Please see the history of present illness.    All 14 point review of systems negative except as described per history of present illness.  EKGs/Labs/Other Studies Reviewed:    The following studies were reviewed today: Cardiac Studies & Procedures      ECHOCARDIOGRAM  ECHOCARDIOGRAM COMPLETE 01/25/2023  Narrative ECHOCARDIOGRAM REPORT    Patient Name:   Valerie Ball Date of Exam: 01/25/2023 Medical Rec #:  [id number]        Height:       62.8 in Accession #:    [id number]       Weight:       145.2 lb Date of Birth:  1946/02/09         BSA:          1.683 m Patient Age:    77 years         BP:           108/73 mmHg Patient Gender: F                HR:           79 bpm. Exam Location:  [facility]  Procedure: 2D Echo, Cardiac Doppler, Color Doppler and Strain Analysis  Indications:    Hyperlipidemia, unspecified hyperlipidemia type [E78.5 (ICD-10-CM)]; Precordial pain [R07.2 (ICD-10-CM)]  History:        Patient has no prior history of Echocardiogram examinations. Signs/Symptoms:Dizziness/Lightheadedness.  Sonographer:    Louie Boston RDCS Referring Phys: [id number] Valerie Corporal Skye Rodarte  IMPRESSIONS   1. Left ventricular ejection fraction, by estimation, is 60 to 65%. The left ventricle has normal function. The left ventricle has no  regional wall motion abnormalities. There is mild left ventricular hypertrophy. Left ventricular diastolic parameters are consistent with Grade I diastolic dysfunction (impaired relaxation). The average left ventricular global longitudinal strain is -14.2 %. The global longitudinal strain is abnormal. 2. Right ventricular systolic function is normal. The right ventricular size is normal. 3. MVA P1/2t 2.18 cm2. The mitral valve is normal in structure. No evidence of mitral valve regurgitation. Mild to moderate mitral stenosis. The mean mitral valve gradient is 4.5 mmHg. 4. The aortic valve is calcified. There is mild calcification of the aortic valve. There is mild  thickening of the aortic valve. Aortic valve regurgitation is mild. No aortic stenosis is present. 5. The inferior vena cava is normal in size with greater than 50% respiratory variability, suggesting right atrial pressure of 3 mmHg.  FINDINGS Left Ventricle: Left ventricular ejection fraction, by estimation, is 60 to 65%. The left ventricle has normal function. The left ventricle has no regional wall motion abnormalities. The average left ventricular global longitudinal strain is -14.2 %. The global longitudinal strain is abnormal. The left ventricular internal cavity size was normal in size. There is mild left ventricular hypertrophy. Left ventricular diastolic parameters are consistent with Grade I diastolic dysfunction (impaired relaxation).  Right Ventricle: The right ventricular size is normal. No increase in right ventricular wall thickness. Right ventricular systolic function is normal.  Left Atrium: Left atrial size was normal in size.  Right Atrium: Right atrial size was normal in size.  Pericardium: There is no evidence of pericardial effusion.  Mitral Valve: MVA P1/2t 2.18 cm2. The mitral valve is normal in structure. There is mild thickening of the mitral valve leaflet(s). There is mild calcification of the mitral valve  leaflet(s). No evidence of mitral valve regurgitation. Mild to moderate mitral valve stenosis. MV peak gradient, 13.9 mmHg. The mean mitral valve gradient is 4.5 mmHg.  Tricuspid Valve: The tricuspid valve is normal in structure. Tricuspid valve regurgitation is trivial. No evidence of tricuspid stenosis.  Aortic Valve: The aortic valve is calcified. There is mild calcification of the aortic valve. There is mild thickening of the aortic valve. Aortic valve regurgitation is mild. Aortic regurgitation PHT measures 519 msec. No aortic stenosis is present.  Pulmonic Valve: The pulmonic valve was normal in structure. Pulmonic valve regurgitation is not visualized. No evidence of pulmonic stenosis.  Aorta: The aortic root is normal in size and structure.  Venous: The inferior vena cava is normal in size with greater than 50% respiratory variability, suggesting right atrial pressure of 3 mmHg.  IAS/Shunts: No atrial level shunt detected by color flow Doppler.   LEFT VENTRICLE PLAX 2D LVIDd:         3.10 cm   Diastology LVIDs:         2.20 cm   LV e' medial:    3.70 cm/s LV PW:         1.10 cm   LV E/e' medial:  23.4 LV IVS:        1.10 cm   LV e' lateral:   5.44 cm/s LVOT diam:     1.80 cm   LV E/e' lateral: 15.9 LV SV:         55 LV SV Index:   33        2D Longitudinal Strain LVOT Area:     2.54 cm  2D Strain GLS Avg:     -14.2 %   RIGHT VENTRICLE RV Basal diam:  2.30 cm RV S prime:     14.70 cm/s TAPSE (M-mode): 1.4 cm  LEFT ATRIUM             Index        RIGHT ATRIUM           Index LA diam:        3.20 cm 1.90 cm/m   RA Area:     10.60 cm LA Vol (A2C):   38.2 ml 22.70 ml/m  RA Volume:   21.60 ml  12.83 ml/m LA Vol (A4C):   31.4 ml 18.66 ml/m LA Biplane Vol: 35.6  ml 21.15 ml/m AORTIC VALVE LVOT Vmax:   120.50 cm/s LVOT Vmean:  80.050 cm/s LVOT VTI:    0.218 m AI PHT:      519 msec  AORTA Ao Root diam: 2.70 cm Ao Desc diam: 3.40 cm  MITRAL VALVE                 TRICUSPID VALVE MV Area (PHT): 2.18 cm     TR Peak grad:   9.6 mmHg MV Area VTI:   1.38 cm     TR Vmax:        155.00 cm/s MV Peak grad:  13.9 mmHg MV Mean grad:  4.5 mmHg     SHUNTS MV Vmax:       1.87 m/s     Systemic VTI:  0.22 m MV Vmean:      95.2 cm/s    Systemic Diam: 1.80 cm MV E velocity: 86.60 cm/s MV A velocity: 164.00 cm/s MV E/A ratio:  0.53  Gypsy Balsam MD Electronically signed by Gypsy Balsam MD Signature Date/Time: 01/25/2023/5:16:35 PM    Final   MONITORS  LONG TERM MONITOR (3-14 DAYS) 01/26/2023  Narrative   Sinus rhythm predominantly with average heart rate 80/min, rare supraventricular and ventricular ectopy burden.   Short runs of supraventricular tachycardia appear consistent with ectopic atrial runs, longest episode lasted 8 beats.   Two patient triggered events in total, correlated with normal sinus rhythm and rate.   Overall benign study.   Patch Wear Time:  7 days and 16 hours (2024-10-17T16:34:29-0400 to 2024-10-25T08:46:09-398)  Predominantly sinus rhythm. Average heart rate 80/min [ranging from 60 bpm to 143 bpm]. Rare supraventricular ectopy, less than 1% burden Rare ventricular ectopy, less than 1% burden. Short runs of supraventricular tachycardia occurred with the longest episode lasting 8 beats with an average rate of 113 bpm, these appear consistent with ectopic atrial tachycardia. Total patient triggers were two and reported without any reported symptoms, correlated with sinus rhythm with normal heart rate ranging from 71 bpm to 81 bpm Overall benign study.  CT SCANS  CT CORONARY MORPH W/CTA COR W/SCORE 01/19/2023  Addendum 02/10/2023 10:06 PM ADDENDUM REPORT: 02/10/2023 22:04  EXAM: OVER-READ INTERPRETATION  CT CHEST  The following report is an over-read performed by radiologist Dr. Curly Shores [facility] on 02/10/2023. This over-read does not include interpretation of cardiac or coronary anatomy or  pathology. The coronary CTA interpretation by the cardiologist is attached.  COMPARISON:  07/01/2015.  FINDINGS: Cardiovascular:  See findings discussed in the body of the report.  Mediastinum/Nodes: No suspicious adenopathy identified. Imaged mediastinal structures are unremarkable.  Lungs/Pleura: Basilar interstitial changes consistent with scarring or subsegmental atelectasis. No pleural effusion or pneumothorax.  Upper Abdomen: Small hiatal hernia.  Musculoskeletal: Bilateral mastectomy. No acute osseous findings. There are thoracic degenerative changes.  IMPRESSION: 1. Basilar scarring or subsegmental atelectasis. 2. Small hiatal hernia.   Electronically Signed By: Layla Maw M.D. On: 02/10/2023 22:04  Narrative CLINICAL DATA:  Chest pain  EXAM: Cardiac/Coronary CTA  TECHNIQUE: A non-contrast, gated CT scan was obtained with axial slices of 3 mm through the heart for calcium scoring. Calcium scoring was performed using the Agatston method. A 120 kV prospective, gated, contrast cardiac scan was obtained. Gantry rotation speed was 250 msecs and collimation was 0.6 mm. Two sublingual nitroglycerin tablets (0.8 mg) were given. The 3D data set was reconstructed in 5% intervals of the 35-75% of the R-R cycle. Diastolic phases were analyzed on a dedicated  workstation using MPR, MIP, and VRT modes. The patient received 95 cc of contrast.  FINDINGS: Image quality: Good.  Noise artifact is: Limited.  Coronary Arteries:  Normal coronary origin.  Right dominance.  Left main: The left main is a large caliber vessel with a normal take off from the left coronary cusp that bifurcates to form a left anterior descending artery and a left circumflex artery. There is no plaque or stenosis.  Left anterior descending artery: The LAD gives off 2 patent diagonal branches. There is mild calcified plaque in the proximal LAD with associated stenosis of 25-49%. There is mild  calcified plaque in a large D1 with associated stenosis of 25-49%. There is moderate mixed plaque in the mid LAD with associated stenosis of 50-69%.  Left circumflex artery: The LCX is non-dominant and gives off 3 patent obtuse marginal branches. There is mild mixed plaque in the proximal and mid LCx and OM3 with associated stenosis of 25-49%.  Right coronary artery: The RCA is dominant with normal take off from the right coronary cusp. The RCA terminates as a PDA and right posterolateral branch. There is mild soft plaque in the ostium of the RCA with associated stenosis of at least 25-49% but could be >50%. There is minimal calcified plaque in the distal RCA with associated stenosis of < 25%.  Right Atrium: Right atrial size is within normal limits.  Right Ventricle: The right ventricular cavity is within normal limits.  Left Atrium: Left atrial size is normal in size with no left atrial appendage filling defect.  Left Ventricle: The ventricular cavity size is within normal limits.  Pulmonary arteries: Normal in size.  Pulmonary veins: Normal pulmonary venous drainage.  Pericardium: Normal thickness without significant effusion or calcium present.  Cardiac valves: The aortic valve is trileaflet with calcifications and with AV calcium score of 174. The mitral valve is normal with mitral annular calcification.  Aorta: Normal caliber with scattered calcifications.  Extra-cardiac findings: See attached radiology report for non-cardiac structures.  IMPRESSION: 1. Coronary calcium score of 411. This was 83rd percentile for age-, sex, and race-matched controls.  2. Total plaque volume 366 mm3 which is 63rd percentile for age- and sex-matched controls (calcified plaque 43mm3; non-calcified plaque 368mm3). TPV is moderate.  3. Normal coronary origin with right dominance.  4. Moderate atherosclerosis. 25-49% prox LAD/D1; 50-69% mid LAD; 25-49% ostial RCA; 25-49% prox  LCx/OM3.  5. Consider symptom-guided anti-ischemic and preventive pharmacotherapy as well as risk factor modification per guideline-directed care.  6. This study has been submitted for FFR analysis of the ostial RCA.  RECOMMENDATIONS: 1. CAD-RADS 0: No evidence of CAD (0%). Consider non-atherosclerotic causes of chest pain.  2. CAD-RADS 1: Minimal non-obstructive CAD (0-24%). Consider non-atherosclerotic causes of chest pain. Consider preventive therapy and risk factor modification.  3. CAD-RADS 2: Mild non-obstructive CAD (25-49%). Consider non-atherosclerotic causes of chest pain. Consider preventive therapy and risk factor modification.  4. CAD-RADS 3: Moderate stenosis. Consider symptom-guided anti-ischemic pharmacotherapy as well as risk factor modification per guideline directed care. Additional analysis with CT FFR will be submitted.  5. CAD-RADS 4: Severe stenosis. (70-99% or > 50% left main). Cardiac catheterization or CT FFR is recommended. Consider symptom-guided anti-ischemic pharmacotherapy as well as risk factor modification per guideline directed care. Invasive coronary angiography recommended with revascularization per published guideline statements.  6. CAD-RADS 5: Total coronary occlusion (100%). Consider cardiac catheterization or viability assessment. Consider symptom-guided anti-ischemic pharmacotherapy as well as risk factor modification per guideline directed care.  7. CAD-RADS N: Non-diagnostic study. Obstructive CAD can't be excluded. Alternative evaluation is recommended.  Armanda Magic, MD  Electronically Signed: By: Armanda Magic M.D. On: 01/19/2023 11:02          EKG:  EKG Interpretation Date/Time:  Monday March 05 2023 16:11:37 EST Ventricular Rate:  87 PR Interval:  148 QRS Duration:  68 QT Interval:  414 QTC Calculation: 498 R Axis:   -17  Text Interpretation: Normal sinus rhythm Possible Left atrial enlargement Possible  Inferior infarct (cited on or before 11-Dec-2022) Anterolateral infarct (cited on or before 11-Dec-2022) Abnormal ECG When compared with ECG of 04-Jan-2023 15:03, Questionable change in initial forces of Lateral leads Confirmed by Huntley Dec reddy [phone]) on 03/05/2023 4:59:32 PM    Recent Labs: 05/04/2022: TSH 4.03 12/11/2022: ALT 31; Hemoglobin 14.5; Platelets 138 01/12/2023: BUN 22; Creatinine, Ser 1.08; Potassium 4.2; Sodium 146  Recent Lipid Panel    Component Value Date/Time   CHOL 112 05/04/2022 0909   TRIG 209.0 (H) 05/04/2022 0909   HDL 33.10 (L) 05/04/2022 0909   CHOLHDL 3 05/04/2022 0909   VLDL 41.8 (H) 05/04/2022 0909   LDLCALC 52 05/04/2021 0901   LDLDIRECT 55.0 05/04/2022 0909    Physical Exam:    VS:  BP 128/74   Pulse 84   Ht 5\' 2"  (1.575 m)   Wt 145 lb 3.2 oz (65.9 kg)   LMP 03/20/1981   SpO2 94%   BMI 26.56 kg/m     Wt Readings from Last 3 Encounters:  03/05/23 145 lb 3.2 oz (65.9 kg)  02/28/23 147 lb (66.7 kg)  02/05/23 147 lb (66.7 kg)     GENERAL:  Well nourished, well developed in no acute distress NECK: No JVD; No carotid bruits CARDIAC: RRR, S1 and S2 present, mild 3/6 systolic murmur heard likely a flow murmur.  Do not appreciate any diastolic murmur at this time. CHEST:  Clear to auscultation without rales, wheezing or rhonchi  Extremities: No pitting pedal edema. Pulses bilaterally symmetric with radial 2+ and dorsalis pedis 2+ NEUROLOGIC:  Alert and oriented x 3  Medication Adjustments/Labs and Tests Ordered: Current medicines are reviewed at length with the patient today.  Concerns regarding medicines are outlined above.  Orders Placed This Encounter  Procedures   Basic metabolic panel   CBC   EKG 12-Lead   No orders of the defined types were placed in this encounter.   Signed, Cecille Amsterdam, MD, MPH, [facility]. 03/05/2023 4:59 PM    [facility]

## 2023-03-04 NOTE — Assessment & Plan Note (Signed)
Continue with aspirin 81 mg once daily. Also continues on rosuvastatin.  For antianginal regimen she is on amlodipine 2.5 mg once daily.  Further discussed invasive approach to assess for CAD, specifically her moderate obstructive disease of LAD noted on CT imaging and to evaluate if it is hemodynamically significant.  Shared Decision Making/Informed Consent{ The risks [stroke (1 in 1000), death (1 in 1000), kidney failure [usually temporary] (1 in 500), bleeding (1 in 200), allergic reaction [possibly serious] (1 in 200)], benefits (diagnostic support and management of coronary artery disease) and alternatives of a cardiac catheterization were discussed in detail with her and her husband and she is willing to proceed.  Will schedule for cardiac catheterization at [facility].  Advised to avoid any moderate to heavy exertion. If symptoms are further worsening or occurring at rest should call 911 or have someone drive her to the ER.

## 2023-03-04 NOTE — Progress Notes (Unsigned)
Cardiology Consultation:    Date:  03/05/2023   ID:  Valerie Ball, DOB October 27, 1945, MRN [id number]  PCP:  Valerie Pimple, MD  Cardiologist:  Valerie Corporal Eddy Liszewski, MD   Referring MD: Valerie Pimple, MD   No chief complaint on file.    ASSESSMENT AND PLAN:   [name] 77 year old woman with moderate coronary artery disease noted on CT coronary angiogram January 19, 2023 with poorly modeled LAD by FFR, mild mitral stenosis, mild aortic insufficiency, hypertension, hyperlipidemia, hypothyroidism, irritable bowel syndrome, hiatal hernia s/p unsuccessful repair, seizure disorder, chronic thrombocytopenia, gastroparesis, NASH, chronic dizziness, chronic microvascular ischemic changes on CT head September 2024, hemorrhoids, palpitations with no major abnormalities on event monitor October 2024, with progressive symptoms of dyspnea on exertion and delay in obtaining cardiac PET/CT now seen for follow-up visit.  Problem List Items Addressed This Visit     Hyperlipidemia associated with type 2 diabetes mellitus (HCC)   Tolerating rosuvastatin well, continue with rosuvastatin 10 mg once daily.   Continue Zetia 10 mg once a day.       Mitral stenosis and aortic incompetence, by TTE 01/25/2023 - Primary   Reviewed findings once again today.       CAD (coronary artery disease)  CT Cardiac 01/19/2023 calcium score 411, plaque volume 366, moderate stenosis LAD, mild stenosis D1, RCA, LCx/OM 3   Continue with aspirin 81 mg once daily. Also continues on rosuvastatin.  For antianginal regimen she is on amlodipine 2.5 mg once daily.  Further discussed invasive approach to assess for CAD, specifically her moderate obstructive disease of LAD noted on CT imaging and to evaluate if it is hemodynamically significant.  Shared Decision Making/Informed Consent{ The risks [stroke (1 in 1000), death (1 in 1000), kidney failure [usually temporary] (1 in 500), bleeding (1 in 200), allergic reaction  [possibly serious] (1 in 200)], benefits (diagnostic support and management of coronary artery disease) and alternatives of a cardiac catheterization were discussed in detail with her and her husband and she is willing to proceed.  Will schedule for cardiac catheterization at [facility]  Advised to avoid any moderate to heavy exertion. If symptoms are further worsening or occurring at rest should call 911 or have someone drive her to the ER.          Relevant Orders   EKG 12-Lead (Completed)   Basic metabolic panel   CBC   Further follow-up in the office based on test results from the cath.   History of Present Illness:    Valerie Ball is a 77 y.o. female who is being seen today for follow-up visit. Last visit with [place] in the office was 02/05/2023. PCP is [facility], Audrie Gallus, MD.   Pleasant woman here for the visit accompanied by her husband.  Has history of moderate coronary atherosclerosis with moderate mid LAD stenosis with mid to distal LAD not modeled by CT FFR 01/19/2023, mild mitral stenosis [mean gradient 4.5 mmHg, MVA 2.18 cm by TTE 01/25/2023], mild aortic insufficiency, diabetes mellitus, hypertension, hyperlipidemia, hypothyroidism, irritable bowel syndrome, hiatal hernia s/p unsuccessful repair, seizure disorder, chronic thrombocytopenia, gastroparesis, nonalcoholic steatohepatitis, chronic dizziness, chronic microvascular ischemic changes on CT head September 2024, hemorrhoids, palpitations with no major abnormalities on event monitor October 2024 [short 8 beat run of SVT consistent with ectopic atrial beats], with progressive symptoms of dyspnea on exertion, further ischemic evaluation with cardiac PET stress was requested, was started on antianginal regimen with amlodipine 2.5 mg once daily.  Her cardiac PET/CT stress test has not yet been done and currently scheduled for Wednesday this week.  She feels her situation is progressively worsening. She reports  shortness of breath and chest heaviness with minimal activities at home walking to the kitchen.  No symptoms at rest. Has been compliant with all her medications. Blood pressures have been well-controlled.  Scheduled this visit to further discuss her progression of symptoms and if continue to wait for cardiac PET/CT stress test to be completed or further evaluate with cardiac catheterization.   Past Medical History:  Diagnosis Date   Allergic rhinitis 08/22/2006   Qualifier: Diagnosis of   By: Lindwood Qua CMA, Jerl Santos      IMO SNOMED Dx Update Oct 2024     Allergy    allegic rhinitis   Alternating constipation and diarrhea 08/12/2015   Anemia    Angiodysplasia of colon    Anxiety    Arthritis    AVM (arteriovenous malformation) of colon    Bilateral knee pain 05/11/2022   Blurred vision, right eye 08/11/2021   Patient does have blurred vision right eye,     Borderline diabetic    per patient medical history form   Branch retinal vein occlusion with macular edema of left eye 07/14/2019   CME superior to the fovea OS has worsened at 7-week interval from macular branch retinal vein occlusion, repeat Avastin today and evaluate exam in 6 weeks     Breast cancer (HCC) 02/2009   breast CA L invasive ductal CA(hormone receptor positive.)   CAD (coronary artery disease)  CT Cardiac 01/19/2023 calcium score 411, plaque volume 366, moderate stenosis LAD, mild stenosis D1, RCA, LCx/OM 3 02/01/2023   Chest pain 12/29/2022   Colon cancer screening 01/23/2014   Colon polyp    hyperplastic   Colon stricture (HCC) 01/14/2003   Cystoid macular edema of left eye 07/14/2019   Decreased calculated GFR 05/11/2022   Diabetic gastroparesis (HCC) 03/27/2016   Typical symptoms -suspect      No detectable diabetic retinopathy 12-27-2020     Diabetic neuropathy (HCC) 11/25/2013   Diaphragmatic hernia 03/02/2008   Qualifier: Diagnosis of   By: Koleen Distance CMA Duncan Dull), Leisha      IMO SNOMED Dx Update Oct 2024      Diverticulitis of colon 10/14/2014   Diverticulosis    Dizziness 01/07/2014   Dyspnea on exertion 01/04/2023   Encounter for Medicare annual wellness exam 01/23/2014   ESOPHAGEAL STRICTURE 03/02/2008   Qualifier: Diagnosis of   By: Koleen Distance CMA (AAMA), Hulan Saas         Essential hypertension 08/22/2006   Qualifier: Diagnosis of   By: Lindwood Qua CMA, Jerl Santos         Estrogen deficiency 01/23/2014   Fatigue 05/20/2018   Fatty liver    Fever, low grade 09/24/2020   GAD (generalized anxiety disorder) 04/15/2007   Qualifier: Diagnosis of   By: Jillyn Hidden FNP, Mcarthur Rossetti        GI bleed    Gout    Hepatic steatosis    Hiatal hernia    Hip pain, bilateral 08/31/2017   History of breast cancer 08/02/2009   Qualifier: Diagnosis of   By: [name] MD, Colon Flattery        History of COVID-19 09/24/2020   History of removal of implants of both breasts 07/01/2013   06/25/13 elected to have bilateral breast implants removed.     History of seizure 08/22/2006   Qualifier: Diagnosis of  By: Lindwood Qua CMA, Jerl Santos         Hyperlipidemia    Hyperlipidemia associated with type 2 diabetes mellitus (HCC) 08/22/2006   Qualifier: Diagnosis of   By: Lindwood Qua CMA, Jerl Santos         Hypertension    Hypertensive retinopathy of left eye 07/14/2019   Hypothyroidism    IBS (irritable bowel syndrome)    Lap Nissen October 2013 01/19/2012   Laparoscopic takedown of large type III mixed hiatus hernia with primary closure of the diaphragm with 3 sutures posteriorally (post 2 with pledgets) and Nissen fundoplication over a #56 lighted bougie        Large type III mixed hiatus hernia  12/06/2011   Plan surgery to repair diaphragm and do Nissen fundoplication     Macular edema    left eye   Melena 01/2008   anemia transfusion   Menopause    per patient medical history form   Mitral stenosis and aortic incompetence, by TTE 01/25/2023 02/01/2023   Palpitations 01/04/2023   Peritoneal adhesions 03/02/2008    Qualifier: Diagnosis of   By: Koleen Distance CMA Duncan Dull), Leisha      IMO SNOMED Dx Update Oct 2024     Poor balance 01/30/2023   Poorly controlled type 2 diabetes mellitus with neuropathy (HCC) 08/19/2007   Qualifier: Diagnosis of   By: [name] MD, Colon Flattery        Posterior vitreous detachment of right eye 11/06/2019   Postmenopausal hormone therapy    per patient medical history form.   Retinal vein occlusion, branch 01/03/2016   Left with hemorrhage and edema (Dr Luciana Axe) 2017     Routine general medical examination at a health care facility 07/11/2011   Salmonella 05/2000   renal effects secondary to dehydration   Seizure disorder (HCC)    Seizures (HCC)    one seizure several yrs ago --etiology unknown-no problem since   Stress reaction 10/25/2011   SYNDROME, CARPAL TUNNEL 08/23/2006   Qualifier: Diagnosis of   By: [name] MD, Colon Flattery        SYNDROME, RESTLESS LEGS 08/23/2006   Qualifier: Diagnosis of   By: [name] MD, Colon Flattery        Thrombocytopenia (HCC) 11/25/2013   TRANSAMINASES, SERUM, ELEVATED 11/11/2009   Qualifier: Diagnosis of   By: [name] MD, Colon Flattery        Tremor 01/20/2014   Head and right hand      Vitamin B12 deficiency 09/21/2016   Vitreous floaters of left eye 07/14/2019   The nature of vitreous floaters was discussed with the patient as well as their frequent occurrence with development of posterior vitreous detachment. The significance of flashes and field cuts was discussed with the patient. The need for a dilated fundus examination was discussed and advice given to return immediately for new or different floaters .  More uncommonly, vitreous floaters, debris, or    Past Surgical History:  Procedure Laterality Date   ABDOMINAL HYSTERECTOMY  1983   BREAST SURGERY  02/2009   breast biopsy invasive ductal CA-bilateral mastectomies   EPIGASTRIC HERNIA REPAIR  01/02/2012   Procedure: HERNIA REPAIR EPIGASTRIC ADULT;  Surgeon: [name], MD;  Location: [facility];   Service: General;  Laterality: N/A;  Laparoscopic Repair Paraesophageal Hernia, Nissen   EYE SURGERY Left 07/2017   Dr. Elwyn Lade CYST EXCISION     LAPAROSCOPIC NISSEN FUNDOPLICATION  01/02/2012   Procedure: LAPAROSCOPIC NISSEN FUNDOPLICATION;  Surgeon: Thornton Park  Daphine Deutscher, MD;  Location: [facility];  Service: General;  Laterality: N/A;   MASTECTOMY  04/2009   Bilateral for ductal carcinoma on L   OVARY SURGERY  1986   ovarian tumor removed   TUBAL LIGATION      Current Medications: Current Meds  Medication Sig   Alpha-Lipoic Acid 600 MG TABS Take 600 mg by mouth 2 (two) times daily.   amLODipine (NORVASC) 2.5 MG tablet Take 1 tablet (2.5 mg total) by mouth daily.   aspirin EC 81 MG tablet Take 1 tablet (81 mg total) by mouth daily. Swallow whole.   Calcium Carbonate (CALCIUM 500 PO) Take 1 capsule by mouth daily.   Cholecalciferol (VITAMIN D3) 1000 units CAPS Take 1 capsule by mouth daily.   Cyanocobalamin (B-12) 1000 MCG TABS Take 1 tablet by mouth daily.   Dulaglutide (TRULICITY) 3 MG/0.5ML SOPN INJECT 0.5 ML (3 MG) UNDER THE SKIN ONCE A WEEK   empagliflozin (JARDIANCE) 25 MG TABS tablet Take 1 tablet (25 mg total) by mouth daily before breakfast.   ezetimibe (ZETIA) 10 MG tablet TAKE 1 TABLET DAILY   famotidine (PEPCID) 20 MG tablet Take 1 tablet (20 mg total) by mouth 2 (two) times daily.   FLUoxetine (PROZAC) 40 MG capsule Take 1 capsule (40 mg total) by mouth daily.   fluticasone (FLONASE) 50 MCG/ACT nasal spray USE 2 SPRAYS IN EACH NOSTRIL DAILY AS NEEDED FOR ALLERGIES   hydrochlorothiazide (HYDRODIURIL) 25 MG tablet TAKE 1 TABLET DAILY   levothyroxine (SYNTHROID) 50 MCG tablet TAKE 1 TABLET DAILY BEFORE BREAKFAST   losartan (COZAAR) 100 MG tablet TAKE 1 TABLET DAILY   metoprolol tartrate (LOPRESSOR) 50 MG tablet Please take 50 mg ( 1 tablet) the night before and 50 mg ( 1 tablet) the morning of your CT   Multiple Vitamin (MULTIVITAMIN WITH MINERALS) TABS tablet Take 1 tablet  by mouth daily.   omeprazole (PRILOSEC) 20 MG capsule TAKE 1 CAPSULE DAILY   repaglinide (PRANDIN) 1 MG tablet TAKE 1 TABLET DAILY BEFORE SUPPER   rOPINIRole (REQUIP) 1 MG tablet TAKE 1 TABLET AT BEDTIME   rosuvastatin (CRESTOR) 10 MG tablet TAKE 1 TABLET TWICE A WEEK     Allergies:   Arimidex [anastrozole], Glipizide, Metformin and related, and Penicillins   Social History   Socioeconomic History   Marital status: Married    Spouse name: Not on file   Number of children: 3   Years of education: Not on file   Highest education level: Not on file  Occupational History   Occupation: retired    Associate Professor: UNEMPLOYED  Tobacco Use   Smoking status: Never   Smokeless tobacco: Never  Vaping Use   Vaping status: Never Used  Substance and Sexual Activity   Alcohol use: Yes    Comment: occasional   Drug use: Never   Sexual activity: Not Currently  Other Topics Concern   Not on file  Social History Narrative   ** Merged History Encounter **       1 cup of coffee every morning   Social Drivers of Health   Financial Resource Strain: Low Risk  (02/28/2023)   Overall Financial Resource Strain (CARDIA)    Difficulty of Paying Living Expenses: Not hard at all  Food Insecurity: No Food Insecurity (02/28/2023)   Hunger Vital Sign    Worried About Running Out of Food in the Last Year: Never true    Ran Out of Food in the Last Year: Never true  Transportation Needs:  No Transportation Needs (02/28/2023)   PRAPARE - Administrator, Civil Service (Medical): No    Lack of Transportation (Non-Medical): No  Physical Activity: Inactive (02/28/2023)   Exercise Vital Sign    Days of Exercise per Week: 0 days    Minutes of Exercise per Session: 0 min  Stress: Stress Concern Present (02/28/2023)   [employer] of Occupational Health - Occupational Stress Questionnaire    Feeling of Stress : Rather much  Social Connections: Socially Integrated (02/28/2023)   Social Connection  and Isolation Panel [NHANES]    Frequency of Communication with Friends and Family: Twice a week    Frequency of Social Gatherings with Friends and Family: Once a week    Attends Religious Services: More than 4 times per year    Active Member of [employer] or Organizations: Yes    Attends Engineer, structural: 1 to 4 times per year    Marital Status: Married     Family History: The patient's family history includes Breast cancer in her mother, sister, and sister; Diabetes in her brother, father, and sister; Esophageal cancer in her father; Heart attack in her father; Hypertension in her brother, mother, and sister; Stroke in her mother. ROS:   Please see the history of present illness.    All 14 point review of systems negative except as described per history of present illness.  EKGs/Labs/Other Studies Reviewed:    The following studies were reviewed today: Cardiac Studies & Procedures      ECHOCARDIOGRAM  ECHOCARDIOGRAM COMPLETE 01/25/2023  Narrative ECHOCARDIOGRAM REPORT    Patient Name:   Valerie Ball Date of Exam: 01/25/2023 Medical Rec #:  [id number]        Height:       62.8 in Accession #:    [id number]       Weight:       145.2 lb Date of Birth:  September 29, 1945         BSA:          1.683 m Patient Age:    77 years         BP:           108/73 mmHg Patient Gender: F                HR:           79 bpm. Exam Location:  [facility]  Procedure: 2D Echo, Cardiac Doppler, Color Doppler and Strain Analysis  Indications:    Hyperlipidemia, unspecified hyperlipidemia type [E78.5 (ICD-10-CM)]; Precordial pain [R07.2 (ICD-10-CM)]  History:        Patient has no prior history of Echocardiogram examinations. Signs/Symptoms:Dizziness/Lightheadedness.  Sonographer:    Louie Boston RDCS Referring Phys: [id number] Valerie Corporal Ena Demary  IMPRESSIONS   1. Left ventricular ejection fraction, by estimation, is 60 to 65%. The left ventricle has normal function. The left ventricle has no  regional wall motion abnormalities. There is mild left ventricular hypertrophy. Left ventricular diastolic parameters are consistent with Grade I diastolic dysfunction (impaired relaxation). The average left ventricular global longitudinal strain is -14.2 %. The global longitudinal strain is abnormal. 2. Right ventricular systolic function is normal. The right ventricular size is normal. 3. MVA P1/2t 2.18 cm2. The mitral valve is normal in structure. No evidence of mitral valve regurgitation. Mild to moderate mitral stenosis. The mean mitral valve gradient is 4.5 mmHg. 4. The aortic valve is calcified. There is mild calcification of the aortic valve. There is mild  thickening of the aortic valve. Aortic valve regurgitation is mild. No aortic stenosis is present. 5. The inferior vena cava is normal in size with greater than 50% respiratory variability, suggesting right atrial pressure of 3 mmHg.  FINDINGS Left Ventricle: Left ventricular ejection fraction, by estimation, is 60 to 65%. The left ventricle has normal function. The left ventricle has no regional wall motion abnormalities. The average left ventricular global longitudinal strain is -14.2 %. The global longitudinal strain is abnormal. The left ventricular internal cavity size was normal in size. There is mild left ventricular hypertrophy. Left ventricular diastolic parameters are consistent with Grade I diastolic dysfunction (impaired relaxation).  Right Ventricle: The right ventricular size is normal. No increase in right ventricular wall thickness. Right ventricular systolic function is normal.  Left Atrium: Left atrial size was normal in size.  Right Atrium: Right atrial size was normal in size.  Pericardium: There is no evidence of pericardial effusion.  Mitral Valve: MVA P1/2t 2.18 cm2. The mitral valve is normal in structure. There is mild thickening of the mitral valve leaflet(s). There is mild calcification of the mitral valve  leaflet(s). No evidence of mitral valve regurgitation. Mild to moderate mitral valve stenosis. MV peak gradient, 13.9 mmHg. The mean mitral valve gradient is 4.5 mmHg.  Tricuspid Valve: The tricuspid valve is normal in structure. Tricuspid valve regurgitation is trivial. No evidence of tricuspid stenosis.  Aortic Valve: The aortic valve is calcified. There is mild calcification of the aortic valve. There is mild thickening of the aortic valve. Aortic valve regurgitation is mild. Aortic regurgitation PHT measures 519 msec. No aortic stenosis is present.  Pulmonic Valve: The pulmonic valve was normal in structure. Pulmonic valve regurgitation is not visualized. No evidence of pulmonic stenosis.  Aorta: The aortic root is normal in size and structure.  Venous: The inferior vena cava is normal in size with greater than 50% respiratory variability, suggesting right atrial pressure of 3 mmHg.  IAS/Shunts: No atrial level shunt detected by color flow Doppler.   LEFT VENTRICLE PLAX 2D LVIDd:         3.10 cm   Diastology LVIDs:         2.20 cm   LV e' medial:    3.70 cm/s LV PW:         1.10 cm   LV E/e' medial:  23.4 LV IVS:        1.10 cm   LV e' lateral:   5.44 cm/s LVOT diam:     1.80 cm   LV E/e' lateral: 15.9 LV SV:         55 LV SV Index:   33        2D Longitudinal Strain LVOT Area:     2.54 cm  2D Strain GLS Avg:     -14.2 %   RIGHT VENTRICLE RV Basal diam:  2.30 cm RV S prime:     14.70 cm/s TAPSE (M-mode): 1.4 cm  LEFT ATRIUM             Index        RIGHT ATRIUM           Index LA diam:        3.20 cm 1.90 cm/m   RA Area:     10.60 cm LA Vol (A2C):   38.2 ml 22.70 ml/m  RA Volume:   21.60 ml  12.83 ml/m LA Vol (A4C):   31.4 ml 18.66 ml/m LA Biplane Vol: 35.6  ml 21.15 ml/m AORTIC VALVE LVOT Vmax:   120.50 cm/s LVOT Vmean:  80.050 cm/s LVOT VTI:    0.218 m AI PHT:      519 msec  AORTA Ao Root diam: 2.70 cm Ao Desc diam: 3.40 cm  MITRAL VALVE                 TRICUSPID VALVE MV Area (PHT): 2.18 cm     TR Peak grad:   9.6 mmHg MV Area VTI:   1.38 cm     TR Vmax:        155.00 cm/s MV Peak grad:  13.9 mmHg MV Mean grad:  4.5 mmHg     SHUNTS MV Vmax:       1.87 m/s     Systemic VTI:  0.22 m MV Vmean:      95.2 cm/s    Systemic Diam: 1.80 cm MV E velocity: 86.60 cm/s MV A velocity: 164.00 cm/s MV E/A ratio:  0.53  Gypsy Balsam MD Electronically signed by Gypsy Balsam MD Signature Date/Time: 01/25/2023/5:16:35 PM    Final   MONITORS  LONG TERM MONITOR (3-14 DAYS) 01/26/2023  Narrative   Sinus rhythm predominantly with average heart rate 80/min, rare supraventricular and ventricular ectopy burden.   Short runs of supraventricular tachycardia appear consistent with ectopic atrial runs, longest episode lasted 8 beats.   Two patient triggered events in total, correlated with normal sinus rhythm and rate.   Overall benign study.   Patch Wear Time:  7 days and 16 hours (2024-10-17T16:34:29-0400 to 2024-10-25T08:46:09-398)  Predominantly sinus rhythm. Average heart rate 80/min [ranging from 60 bpm to 143 bpm]. Rare supraventricular ectopy, less than 1% burden Rare ventricular ectopy, less than 1% burden. Short runs of supraventricular tachycardia occurred with the longest episode lasting 8 beats with an average rate of 113 bpm, these appear consistent with ectopic atrial tachycardia. Total patient triggers were two and reported without any reported symptoms, correlated with sinus rhythm with normal heart rate ranging from 71 bpm to 81 bpm Overall benign study.  CT SCANS  CT CORONARY MORPH W/CTA COR W/SCORE 01/19/2023  Addendum 02/10/2023 10:06 PM ADDENDUM REPORT: 02/10/2023 22:04  EXAM: OVER-READ INTERPRETATION  CT CHEST  The following report is an over-read performed by radiologist Dr. Curly Shores [facility] on 02/10/2023. This over-read does not include interpretation of cardiac or coronary anatomy or  pathology. The coronary CTA interpretation by the cardiologist is attached.  COMPARISON:  07/01/2015.  FINDINGS: Cardiovascular:  See findings discussed in the body of the report.  Mediastinum/Nodes: No suspicious adenopathy identified. Imaged mediastinal structures are unremarkable.  Lungs/Pleura: Basilar interstitial changes consistent with scarring or subsegmental atelectasis. No pleural effusion or pneumothorax.  Upper Abdomen: Small hiatal hernia.  Musculoskeletal: Bilateral mastectomy. No acute osseous findings. There are thoracic degenerative changes.  IMPRESSION: 1. Basilar scarring or subsegmental atelectasis. 2. Small hiatal hernia.   Electronically Signed By: Layla Maw M.D. On: 02/10/2023 22:04  Narrative CLINICAL DATA:  Chest pain  EXAM: Cardiac/Coronary CTA  TECHNIQUE: A non-contrast, gated CT scan was obtained with axial slices of 3 mm through the heart for calcium scoring. Calcium scoring was performed using the Agatston method. A 120 kV prospective, gated, contrast cardiac scan was obtained. Gantry rotation speed was 250 msecs and collimation was 0.6 mm. Two sublingual nitroglycerin tablets (0.8 mg) were given. The 3D data set was reconstructed in 5% intervals of the 35-75% of the R-R cycle. Diastolic phases were analyzed on a dedicated  workstation using MPR, MIP, and VRT modes. The patient received 95 cc of contrast.  FINDINGS: Image quality: Good.  Noise artifact is: Limited.  Coronary Arteries:  Normal coronary origin.  Right dominance.  Left main: The left main is a large caliber vessel with a normal take off from the left coronary cusp that bifurcates to form a left anterior descending artery and a left circumflex artery. There is no plaque or stenosis.  Left anterior descending artery: The LAD gives off 2 patent diagonal branches. There is mild calcified plaque in the proximal LAD with associated stenosis of 25-49%. There is mild  calcified plaque in a large D1 with associated stenosis of 25-49%. There is moderate mixed plaque in the mid LAD with associated stenosis of 50-69%.  Left circumflex artery: The LCX is non-dominant and gives off 3 patent obtuse marginal branches. There is mild mixed plaque in the proximal and mid LCx and OM3 with associated stenosis of 25-49%.  Right coronary artery: The RCA is dominant with normal take off from the right coronary cusp. The RCA terminates as a PDA and right posterolateral branch. There is mild soft plaque in the ostium of the RCA with associated stenosis of at least 25-49% but could be >50%. There is minimal calcified plaque in the distal RCA with associated stenosis of < 25%.  Right Atrium: Right atrial size is within normal limits.  Right Ventricle: The right ventricular cavity is within normal limits.  Left Atrium: Left atrial size is normal in size with no left atrial appendage filling defect.  Left Ventricle: The ventricular cavity size is within normal limits.  Pulmonary arteries: Normal in size.  Pulmonary veins: Normal pulmonary venous drainage.  Pericardium: Normal thickness without significant effusion or calcium present.  Cardiac valves: The aortic valve is trileaflet with calcifications and with AV calcium score of 174. The mitral valve is normal with mitral annular calcification.  Aorta: Normal caliber with scattered calcifications.  Extra-cardiac findings: See attached radiology report for non-cardiac structures.  IMPRESSION: 1. Coronary calcium score of 411. This was 83rd percentile for age-, sex, and race-matched controls.  2. Total plaque volume 366 mm3 which is 63rd percentile for age- and sex-matched controls (calcified plaque 45mm3; non-calcified plaque 362mm3). TPV is moderate.  3. Normal coronary origin with right dominance.  4. Moderate atherosclerosis. 25-49% prox LAD/D1; 50-69% mid LAD; 25-49% ostial RCA; 25-49% prox  LCx/OM3.  5. Consider symptom-guided anti-ischemic and preventive pharmacotherapy as well as risk factor modification per guideline-directed care.  6. This study has been submitted for FFR analysis of the ostial RCA.  RECOMMENDATIONS: 1. CAD-RADS 0: No evidence of CAD (0%). Consider non-atherosclerotic causes of chest pain.  2. CAD-RADS 1: Minimal non-obstructive CAD (0-24%). Consider non-atherosclerotic causes of chest pain. Consider preventive therapy and risk factor modification.  3. CAD-RADS 2: Mild non-obstructive CAD (25-49%). Consider non-atherosclerotic causes of chest pain. Consider preventive therapy and risk factor modification.  4. CAD-RADS 3: Moderate stenosis. Consider symptom-guided anti-ischemic pharmacotherapy as well as risk factor modification per guideline directed care. Additional analysis with CT FFR will be submitted.  5. CAD-RADS 4: Severe stenosis. (70-99% or > 50% left main). Cardiac catheterization or CT FFR is recommended. Consider symptom-guided anti-ischemic pharmacotherapy as well as risk factor modification per guideline directed care. Invasive coronary angiography recommended with revascularization per published guideline statements.  6. CAD-RADS 5: Total coronary occlusion (100%). Consider cardiac catheterization or viability assessment. Consider symptom-guided anti-ischemic pharmacotherapy as well as risk factor modification per guideline directed care.  7. CAD-RADS N: Non-diagnostic study. Obstructive CAD can't be excluded. Alternative evaluation is recommended.  Armanda Magic, MD  Electronically Signed: By: Armanda Magic M.D. On: 01/19/2023 11:02          EKG:  EKG Interpretation Date/Time:  Monday March 05 2023 16:11:37 EST Ventricular Rate:  87 PR Interval:  148 QRS Duration:  68 QT Interval:  414 QTC Calculation: 498 R Axis:   -17  Text Interpretation: Normal sinus rhythm Possible Left atrial enlargement Possible  Inferior infarct (cited on or before 11-Dec-2022) Anterolateral infarct (cited on or before 11-Dec-2022) Abnormal ECG When compared with ECG of 04-Jan-2023 15:03, Questionable change in initial forces of Lateral leads Confirmed by Huntley Dec reddy [phone]) on 03/05/2023 4:59:32 PM    Recent Labs: 05/04/2022: TSH 4.03 12/11/2022: ALT 31; Hemoglobin 14.5; Platelets 138 01/12/2023: BUN 22; Creatinine, Ser 1.08; Potassium 4.2; Sodium 146  Recent Lipid Panel    Component Value Date/Time   CHOL 112 05/04/2022 0909   TRIG 209.0 (H) 05/04/2022 0909   HDL 33.10 (L) 05/04/2022 0909   CHOLHDL 3 05/04/2022 0909   VLDL 41.8 (H) 05/04/2022 0909   LDLCALC 52 05/04/2021 0901   LDLDIRECT 55.0 05/04/2022 0909    Physical Exam:    VS:  BP 128/74   Pulse 84   Ht 5\' 2"  (1.575 m)   Wt 145 lb 3.2 oz (65.9 kg)   LMP 03/20/1981   SpO2 94%   BMI 26.56 kg/m     Wt Readings from Last 3 Encounters:  03/05/23 145 lb 3.2 oz (65.9 kg)  02/28/23 147 lb (66.7 kg)  02/05/23 147 lb (66.7 kg)     GENERAL:  Well nourished, well developed in no acute distress NECK: No JVD; No carotid bruits CARDIAC: RRR, S1 and S2 present, mild 3/6 systolic murmur heard likely a flow murmur.  Do not appreciate any diastolic murmur at this time. CHEST:  Clear to auscultation without rales, wheezing or rhonchi  Extremities: No pitting pedal edema. Pulses bilaterally symmetric with radial 2+ and dorsalis pedis 2+ NEUROLOGIC:  Alert and oriented x 3  Medication Adjustments/Labs and Tests Ordered: Current medicines are reviewed at length with the patient today.  Concerns regarding medicines are outlined above.  Orders Placed This Encounter  Procedures   Basic metabolic panel   CBC   EKG 12-Lead   No orders of the defined types were placed in this encounter.   Signed, Cecille Amsterdam, MD, MPH, [facility] 03/05/2023 4:59 PM    [facility]

## 2023-03-05 ENCOUNTER — Encounter (HOSPITAL_COMMUNITY): Payer: Self-pay

## 2023-03-05 ENCOUNTER — Ambulatory Visit: Payer: TRICARE For Life (TFL)

## 2023-03-05 VITALS — BP 128/74 | HR 84 | Ht 62.0 in | Wt 145.2 lb

## 2023-03-05 DIAGNOSIS — I080 Rheumatic disorders of both mitral and aortic valves: Secondary | ICD-10-CM | POA: Diagnosis not present

## 2023-03-05 DIAGNOSIS — E785 Hyperlipidemia, unspecified: Secondary | ICD-10-CM

## 2023-03-05 DIAGNOSIS — I25110 Atherosclerotic heart disease of native coronary artery with unstable angina pectoris: Secondary | ICD-10-CM

## 2023-03-05 DIAGNOSIS — E1169 Type 2 diabetes mellitus with other specified complication: Secondary | ICD-10-CM

## 2023-03-05 NOTE — Patient Instructions (Signed)
Medication Instructions:  Your physician recommends that you continue on your current medications as directed. Please refer to the Current Medication list given to you today.   *If you need a refill on your cardiac medications before your next appointment, please call your pharmacy*   Lab Work: Your physician recommends that you have a BMET and CBC today in the office for your upcoming procedure.  If you have labs (blood work) drawn today and your tests are completely normal, you will receive your results only by: MyChart Message (if you have MyChart) OR A paper copy in the mail If you have any lab test that is abnormal or we need to change your treatment, we will call you to review the results.   Testing/Procedures:  [place] [facility] DEPT OF [facility] [place] [address] [place] [address] Dept: [phone] Loc: [phone]  ABHA RACANELLI  03/05/2023  You are scheduled for a Cardiac Catheterization on Thursday, December 19 with Dr. Verne Carrow.  1. Please arrive at the [facility] (Main Entrance A) at [facility] [address] [place], [place] [address] at 7:00 AM (This time is 2 hour(s) before your procedure to ensure your preparation).   Free valet parking service is available. You will check in at ADMITTING. The support person will be asked to wait in the waiting room.  It is OK to have someone drop you off and come back when you are ready to be discharged.    Special note: Every effort is made to have your procedure done on time. Please understand that emergencies sometimes delay scheduled procedures.  2. Diet: Do not eat solid foods after midnight.  The patient may have clear liquids until 5am upon the day of the procedure.  3. Labs: You had your labs done in the office today.  4. Medication instructions in preparation for your procedure:   Contrast Allergy: No  Stop taking, Cozaar  (Losartan) Thursday, December 19,, Jardiance Monday, December 16,Trulicity Monday, December 16, Hydrochlorothiazide Monday, December 16   On the morning of your procedure, take your Aspirin 81 mg and any morning medicines NOT listed above.  You may use sips of water.  5. Plan to go home the same day, you will only stay overnight if medically necessary. 6. Bring a current list of your medications and current insurance cards. 7. You MUST have a responsible person to drive you home. 8. Someone MUST be with you the first 24 hours after you arrive home or your discharge will be delayed. 9. Please wear clothes that are easy to get on and off and wear slip-on shoes.  Thank you for allowing [place] to care for you!   -- [place] [facility]    Follow-Up: At [facility], you and your health needs are our priority.  As part of our continuing mission to provide you with exceptional heart care, we have created designated Provider Care Teams.  These Care Teams include your primary Cardiologist (physician) and Advanced Practice Providers (APPs -  Physician Assistants and Nurse Practitioners) who all work together to provide you with the care you need, when you need it.  We recommend signing up for the patient portal called "MyChart".  Sign up information is provided on this After Visit Summary.  MyChart is used to connect with patients for Virtual Visits (Telemedicine).  Patients are able to view lab/test results, encounter notes, upcoming appointments, etc.  Non-urgent  messages can be sent to your provider as well.   To learn more about what you can do with MyChart, go to [email or web address].    Your next appointment:   1 month(s)  The format for your next appointment:   In Person  Provider:   Huntley Dec, MD   Other Instructions  Coronary Angiogram With Stent Coronary angiogram with stent placement is a procedure to widen or open a narrow blood vessel of the  heart (coronary artery). Arteries may become blocked by cholesterol buildup (plaques) in the lining of the artery wall. When a coronary artery becomes partially blocked, blood flow to that area decreases. This may lead to chest pain or a heart attack (myocardial infarction). A stent is a small piece of metal that looks like mesh or spring. Stent placement may be done as treatment after a heart attack, or to prevent a heart attack if a blocked artery is found by a coronary angiogram. Let your health care provider know about: Any allergies you have, including allergies to medicines or contrast dye. All medicines you are taking, including vitamins, herbs, eye drops, creams, and over-the-counter medicines. Any problems you or family members have had with anesthetic medicines. Any blood disorders you have. Any surgeries you have had. Any medical conditions you have, including kidney problems or kidney failure. Whether you are pregnant or may be pregnant. Whether you are breastfeeding. What are the risks? Generally, this is a safe procedure. However, serious problems may occur, including: Damage to nearby structures or organs, such as the heart, blood vessels, or kidneys. A return of blockage. Bleeding, infection, or bruising at the insertion site. A collection of blood under the skin (hematoma) at the insertion site. A blood clot in another part of the body. Allergic reaction to medicines or dyes. Bleeding into the abdomen (retroperitoneal bleeding). Stroke (rare). Heart attack (rare). What happens before the procedure? Staying hydrated Follow instructions from your health care provider about hydration, which may include: Up to 2 hours before the procedure - you may continue to drink clear liquids, such as water, clear fruit juice, black coffee, and plain tea.    Eating and drinking restrictions Follow instructions from your health care provider about eating and drinking, which may include: 8  hours before the procedure - stop eating heavy meals or foods, such as meat, fried foods, or fatty foods. 6 hours before the procedure - stop eating light meals or foods, such as toast or cereal. 2 hours before the procedure - stop drinking clear liquids. Medicines Ask your health care provider about: Changing or stopping your regular medicines. This is especially important if you are taking diabetes medicines or blood thinners. Taking medicines such as aspirin and ibuprofen. These medicines can thin your blood. Do not take these medicines unless your health care provider tells you to take them. Generally, aspirin is recommended before a thin tube, called a catheter, is passed through a blood vessel and inserted into the heart (cardiac catheterization). Taking over-the-counter medicines, vitamins, herbs, and supplements. General instructions Do not use any products that contain nicotine or tobacco for at least 4 weeks before the procedure. These products include cigarettes, e-cigarettes, and chewing tobacco. If you need help quitting, ask your health care provider. Plan to have someone take you home from the hospital or clinic. If you will be going home right after the procedure, plan to have someone with you for 24 hours. You may have tests and imaging procedures. Ask your health care provider:  How your insertion site will be marked. Ask which artery will be used for the procedure. What steps will be taken to help prevent infection. These may include: Removing hair at the insertion site. Washing skin with a germ-killing soap. Taking antibiotic medicine. What happens during the procedure? An IV will be inserted into one of your veins. Electrodes may be placed on your chest to monitor your heart rate during the procedure. You will be given one or more of the following: A medicine to help you relax (sedative). A medicine to numb the area (local anesthetic) for catheter insertion. A small  incision will be made for catheter insertion. The catheter will be inserted into an artery using a guide wire. The location may be in your groin, your wrist, or the fold of your arm (near your elbow). An X-ray procedure (fluoroscopy) will be used to help guide the catheter to the opening of the heart arteries. A dye will be injected into the catheter. X-rays will be taken. The dye helps to show where any narrowing or blockages are located in the arteries. Tell your health care provider if you have chest pain or trouble breathing. A tiny wire will be guided to the blocked spot, and a balloon will be inflated to make the artery wider. The stent will be expanded to crush the plaques into the wall of the vessel. The stent will hold the area open and improve the blood flow. Most stents have a drug coating to reduce the risk of the stent narrowing over time. The artery may be made wider using a drill, laser, or other tools that remove plaques. The catheter will be removed when the blood flow improves. The stent will stay where it was placed, and the lining of the artery will grow over it. A bandage (dressing) will be placed on the insertion site. Pressure will be applied to stop bleeding. The IV will be removed. This procedure may vary among health care providers and hospitals.    What happens after the procedure? Your blood pressure, heart rate, breathing rate, and blood oxygen level will be monitored until you leave the hospital or clinic. If the procedure is done through the leg, you will lie flat in bed for a few hours or for as long as told by your health care provider. You will be instructed not to bend or cross your legs. The insertion site and the pulse in your foot or wrist will be checked often. You may have more blood tests, X-rays, and a test that records the electrical activity of your heart (electrocardiogram, or ECG). Do not drive for 24 hours if you were given a sedative during your  procedure. Summary Coronary angiogram with stent placement is a procedure to widen or open a narrowed coronary artery. This is done to treat heart problems. Before the procedure, let your health care provider know about all the medical conditions and surgeries you have or have had. This is a safe procedure. However, some problems may occur, including damage to nearby structures or organs, bleeding, blood clots, or allergies. Follow your health care provider's instructions about eating, drinking, medicines, and other lifestyle changes, such as quitting tobacco use before the procedure. This information is not intended to replace advice given to you by your health care provider. Make sure you discuss any questions you have with your health care provider. Document Revised: 09/25/2018 Document Reviewed: 09/25/2018 Elsevier Patient Education  2021 Elsevier Inc.  Aspirin and Your Heart Aspirin is a  medicine that prevents the platelets in your blood from sticking together. Platelets are the cells that your blood uses for clotting. Aspirin can be used to help reduce the risk of blood clots, heart attacks, and other heart-related problems. What are the risks? Daily use of aspirin can cause side effects. Some of these include: Bleeding. Bleeding can be minor or serious. An example of minor bleeding is bleeding from a cut, and the bleeding does not stop. An example of more serious bleeding is stomach bleeding or, rarely, bleeding into the brain. Your risk of bleeding increases if you are also taking NSAIDs, such as ibuprofen. Increased bruising. Upset stomach. An allergic reaction. People who have growths inside the nose (nasal polyps) have an increased risk of developing an aspirin allergy. How to use aspirin to care for your heart Take aspirin only as told by your health care provider. Make sure that you understand how much to take and what form to take. The two forms of aspirin  are: Non-enteric-coated.This type of aspirin does not have a coating and is absorbed quickly. This type of aspirin also comes in a chewable form. Enteric-coated. This type of aspirin has a coating that releases the medicine very slowly. Enteric-coated aspirin might cause less stomach upset than non-enteric-coated aspirin. This type of aspirin should not be chewed or crushed. Work with your health care provider to find out whether it is safe and beneficial for you to take aspirin daily. Taking aspirin daily may be helpful if: You have had a heart attack or chest pain, or you are at risk for a heart attack. You have a condition in which certain heart vessels are blocked (coronary artery disease), and you have had a procedure to treat it. Examples are: Open-heart surgery, such as coronary artery bypass surgery (CABG). Coronary angioplasty,which is done to widen a blood vessel of your heart. Having a small mesh tube, or stent, placed in your coronary artery. You have had certain types of stroke or a mini-stroke known as a transient ischemic attack (TIA). You have a narrowing of the arteries that supply the limbs (peripheral artery disease, or PAD). You have long-term (chronic) heart rhythm problems, such as atrial fibrillation, and your health care provider thinks aspirin may help. You have valve disease or have had surgery on a valve. You are considered at increased risk of developing coronary artery disease or PAD.    Follow these instructions at home Medicines Take over-the-counter and prescription medicines only as told by your health care provider. If you are taking blood thinners: Talk with your health care provider before you take any medicines that contain aspirin or NSAIDs, such as ibuprofen. These medicines increase your risk for dangerous bleeding. Take your medicine exactly as told, at the same time every day. Avoid activities that could cause injury or bruising, and follow instructions  about how to prevent falls. Wear a medical alert bracelet or carry a card that lists what medicines you take. General instructions Do not drink alcohol if: Your health care provider tells you not to drink. You are pregnant, may be pregnant, or are planning to become pregnant. If you drink alcohol: Limit how much you use to: 0-1 drink a day for women. 0-2 drinks a day for men. Be aware of how much alcohol is in your drink. In the [place] one drink equals one 12 oz bottle of beer (355 mL), one 5 oz glass of wine (148 mL), or one 1 oz glass of hard liquor (44  mL). Keep all follow-up visits as told by your health care provider. This is important. Where to find more information The American Heart Association: www.heart.org Contact a health care provider if you have: Unusual bleeding or bruising. Stomach pain or nausea. Ringing in your ears. An allergic reaction that causes hives, itchy skin, or swelling of the lips, tongue, or face. Get help right away if: You notice that your bowel movements are bloody, or dark red or black in color. You vomit or cough up blood. You have blood in your urine. You cough, breathe loudly (wheeze), or feel short of breath. You have chest pain, especially if the pain spreads to your arms, back, neck, or jaw. You have a headache with confusion. You have any symptoms of a stroke. "BE FAST" is an easy way to remember the main warning signs of a stroke: B - Balance. Signs are dizziness, sudden trouble walking, or loss of balance. E - Eyes. Signs are trouble seeing or a sudden change in vision. F - Face. Signs are sudden weakness or numbness of the face, or the face or eyelid drooping on one side. A - Arms. Signs are weakness or numbness in an arm. This happens suddenly and usually on one side of the body. S - Speech. Signs are sudden trouble speaking, slurred speech, or trouble understanding what people say. T - Time. Time to call emergency services. Write down what  time symptoms started. You have other signs of a stroke, such as: A sudden, severe headache with no known cause. Nausea or vomiting. Seizure. These symptoms may represent a serious problem that is an emergency. Do not wait to see if the symptoms will go away. Get medical help right away. Call your local emergency services (911 in the [place] Do not drive yourself to the hospital. Summary Aspirin use can help reduce the risk of blood clots, heart attacks, and other heart-related problems. Daily use of aspirin can cause side effects. Take aspirin only as told by your health care provider. Make sure that you understand how much to take and what form to take. Your health care provider will help you determine whether it is safe and beneficial for you to take aspirin daily. This information is not intended to replace advice given to you by your health care provider. Make sure you discuss any questions you have with your health care provider. Document Revised: 12/09/2018 Document Reviewed: 12/09/2018 Elsevier Patient Education  2021 Elsevier Inc. Nitroglycerin sublingual tablets What is this medicine? NITROGLYCERIN (nye troe GLI ser in) is a type of vasodilator. It relaxes blood vessels, increasing the blood and oxygen supply to your heart. This medicine is used to relieve chest pain caused by angina. It is also used to prevent chest pain before activities like climbing stairs, going outdoors in cold weather, or sexual activity. This medicine may be used for other purposes; ask your health care provider or pharmacist if you have questions. COMMON BRAND NAME(S): Nitroquick, Nitrostat, Nitrotab What should I tell my health care provider before I take this medicine? They need to know if you have any of these conditions: anemia head injury, recent stroke, or bleeding in the brain liver disease previous heart attack an unusual or allergic reaction to nitroglycerin, other medicines, foods, dyes, or  preservatives pregnant or trying to get pregnant breast-feeding How should I use this medicine? Take this medicine by mouth as needed. Use at the first sign of an angina attack (chest pain or tightness). You can also take  this medicine 5 to 10 minutes before an event likely to produce chest pain. Follow the directions exactly as written on the prescription label. Place one tablet under your tongue and let it dissolve. Do not swallow whole. Replace the dose if you accidentally swallow it. It will help if your mouth is not dry. Saliva around the tablet will help it to dissolve more quickly. Do not eat or drink, smoke or chew tobacco while a tablet is dissolving. Sit down when taking this medicine. In an angina attack, you should feel better within 5 minutes after your first dose. You can take a dose every 5 minutes up to a total of 3 doses. If you do not feel better or feel worse after 1 dose, call 9-1-1 at once. Do not take more than 3 doses in 15 minutes. Your health care provider might give you other directions. Follow those directions if he or she does. Do not take your medicine more often than directed. Talk to your health care provider about the use of this medicine in children. Special care may be needed. Overdosage: If you think you have taken too much of this medicine contact a poison control center or emergency room at once. NOTE: This medicine is only for you. Do not share this medicine with others. What if I miss a dose? This does not apply. This medicine is only used as needed. What may interact with this medicine? Do not take this medicine with any of the following medications: certain migraine medicines like ergotamine and dihydroergotamine (DHE) medicines used to treat erectile dysfunction like sildenafil, tadalafil, and vardenafil riociguat This medicine may also interact with the following medications: alteplase aspirin heparin medicines for high blood pressure medicines for  mental depression other medicines used to treat angina phenothiazines like chlorpromazine, mesoridazine, prochlorperazine, thioridazine This list may not describe all possible interactions. Give your health care provider a list of all the medicines, herbs, non-prescription drugs, or dietary supplements you use. Also tell them if you smoke, drink alcohol, or use illegal drugs. Some items may interact with your medicine. What should I watch for while using this medicine? Tell your doctor or health care professional if you feel your medicine is no longer working. Keep this medicine with you at all times. Sit or lie down when you take your medicine to prevent falling if you feel dizzy or faint after using it. Try to remain calm. This will help you to feel better faster. If you feel dizzy, take several deep breaths and lie down with your feet propped up, or bend forward with your head resting between your knees. You may get drowsy or dizzy. Do not drive, use machinery, or do anything that needs mental alertness until you know how this drug affects you. Do not stand or sit up quickly, especially if you are an older patient. This reduces the risk of dizzy or fainting spells. Alcohol can make you more drowsy and dizzy. Avoid alcoholic drinks. Do not treat yourself for coughs, colds, or pain while you are taking this medicine without asking your doctor or health care professional for advice. Some ingredients may increase your blood pressure. What side effects may I notice from receiving this medicine? Side effects that you should report to your doctor or health care professional as soon as possible: allergic reactions (skin rash, itching or hives; swelling of the face, lips, or tongue) low blood pressure (dizziness; feeling faint or lightheaded, falls; unusually weak or tired) low red blood cell counts (  trouble breathing; feeling faint; lightheaded, falls; unusually weak or tired) Side effects that usually do  not require medical attention (report to your doctor or health care professional if they continue or are bothersome): facial flushing (redness) headache nausea, vomiting This list may not describe all possible side effects. Call your doctor for medical advice about side effects. You may report side effects to FDA at 1-800-FDA-1088. Where should I keep my medicine? Keep out of the reach of children. Store at room temperature between 20 and 25 degrees C (68 and 77 degrees F). Store in Retail buyer. Protect from light and moisture. Keep tightly closed. Throw away any unused medicine after the expiration date. NOTE: This sheet is a summary. It may not cover all possible information. If you have questions about this medicine, talk to your doctor, pharmacist, or health care provider.  2021 Elsevier/Gold Standard (2017-12-05 16:46:32)

## 2023-03-05 NOTE — Assessment & Plan Note (Signed)
Tolerating rosuvastatin well, continue with rosuvastatin 10 mg once daily.   Continue Zetia 10 mg once a day.

## 2023-03-05 NOTE — Assessment & Plan Note (Signed)
Reviewed findings once again today.

## 2023-03-06 ENCOUNTER — Other Ambulatory Visit: Payer: Self-pay | Admitting: *Deleted

## 2023-03-06 ENCOUNTER — Telehealth: Payer: Self-pay | Admitting: *Deleted

## 2023-03-06 LAB — BASIC METABOLIC PANEL
BUN/Creatinine Ratio: 15 (ref 12–28)
BUN: 17 mg/dL (ref 8–27)
CO2: 25 mmol/L (ref 20–29)
Calcium: 10 mg/dL (ref 8.7–10.3)
Chloride: 102 mmol/L (ref 96–106)
Creatinine, Ser: 1.12 mg/dL — ABNORMAL HIGH (ref 0.57–1.00)
Glucose: 118 mg/dL — ABNORMAL HIGH (ref 70–99)
Potassium: 4.8 mmol/L (ref 3.5–5.2)
Sodium: 143 mmol/L (ref 134–144)
eGFR: 51 mL/min/{1.73_m2} — ABNORMAL LOW (ref >59)

## 2023-03-06 LAB — CBC
Hematocrit: 43.3 % (ref 34.0–46.6)
Hemoglobin: 14.2 g/dL (ref 11.1–15.9)
MCH: 27.9 pg (ref 26.6–33.0)
MCHC: 32.8 g/dL (ref 31.5–35.7)
MCV: 85 fL (ref 79–97)
Platelets: 142 10*3/uL — ABNORMAL LOW (ref 150–450)
RBC: 5.09 x10E6/uL (ref 3.77–5.28)
RDW: 14.3 % (ref 11.7–15.4)
WBC: 5.2 10*3/uL (ref 3.4–10.8)

## 2023-03-06 MED ORDER — FLUOXETINE HCL 40 MG PO CAPS: 40.0000 mg | ORAL_CAPSULE | Freq: Every day | ORAL | 0 refills | Status: DC

## 2023-03-06 NOTE — Telephone Encounter (Signed)
Cardiac Catheterization scheduled at [facility] for: Thursday March 08, 2023 9 AM Arrival time [facility] Main Entrance A at: 7 AM  Nothing to eat after midnight prior to procedure, clear liquids until 5 AM day of procedure.  Medication instructions: -Hold:  Losartan/hydrochlorothiazide-day before and day of procedure-per protocol GFR <60 (51)  Jardiance-AM of procedure  Prandin-pt takes HS-will hold HS prior to procedure -Other usual morning medications can be taken with sips of water including aspirin 81 mg.  Patient reports weekly Trulicity on Saturdays  Plan to go home the same day, you will only stay overnight if medically necessary.  You must have responsible adult to drive you home.  Someone must be with you the first 24 hours after you arrive home.  Reviewed procedure instructions with patient.

## 2023-03-07 ENCOUNTER — Ambulatory Visit (HOSPITAL_COMMUNITY): Admission: RE | Admit: 2023-03-07 | Payer: Medicare HMO | Source: Ambulatory Visit

## 2023-03-08 ENCOUNTER — Other Ambulatory Visit: Payer: Self-pay

## 2023-03-08 ENCOUNTER — Ambulatory Visit (HOSPITAL_COMMUNITY)
Admission: RE | Admit: 2023-03-08 | Discharge: 2023-03-08 | Disposition: A | Discharge status: Home / Self Care | Payer: Medicare HMO | Attending: Cardiovascular Disease | Admitting: Cardiovascular Disease

## 2023-03-08 ENCOUNTER — Other Ambulatory Visit (HOSPITAL_COMMUNITY): Payer: Self-pay

## 2023-03-08 ENCOUNTER — Ambulatory Visit (HOSPITAL_COMMUNITY)
Admission: RE | Disposition: A | Discharge status: Home / Self Care | Payer: Self-pay | Source: Home / Self Care | Attending: Cardiovascular Disease

## 2023-03-08 DIAGNOSIS — Z7982 Long term (current) use of aspirin: Secondary | ICD-10-CM | POA: Diagnosis not present

## 2023-03-08 DIAGNOSIS — K3184 Gastroparesis: Secondary | ICD-10-CM | POA: Diagnosis not present

## 2023-03-08 DIAGNOSIS — K449 Diaphragmatic hernia without obstruction or gangrene: Secondary | ICD-10-CM | POA: Diagnosis not present

## 2023-03-08 DIAGNOSIS — E039 Hypothyroidism, unspecified: Secondary | ICD-10-CM | POA: Diagnosis not present

## 2023-03-08 DIAGNOSIS — E785 Hyperlipidemia, unspecified: Secondary | ICD-10-CM | POA: Insufficient documentation

## 2023-03-08 DIAGNOSIS — K589 Irritable bowel syndrome without diarrhea: Secondary | ICD-10-CM | POA: Diagnosis not present

## 2023-03-08 DIAGNOSIS — I25110 Atherosclerotic heart disease of native coronary artery with unstable angina pectoris: Secondary | ICD-10-CM | POA: Diagnosis not present

## 2023-03-08 DIAGNOSIS — Z955 Presence of coronary angioplasty implant and graft: Secondary | ICD-10-CM | POA: Diagnosis not present

## 2023-03-08 DIAGNOSIS — G40909 Epilepsy, unspecified, not intractable, without status epilepticus: Secondary | ICD-10-CM | POA: Insufficient documentation

## 2023-03-08 DIAGNOSIS — Z79899 Other long term (current) drug therapy: Secondary | ICD-10-CM | POA: Insufficient documentation

## 2023-03-08 DIAGNOSIS — E1143 Type 2 diabetes mellitus with diabetic autonomic (poly)neuropathy: Secondary | ICD-10-CM | POA: Diagnosis not present

## 2023-03-08 DIAGNOSIS — Z7984 Long term (current) use of oral hypoglycemic drugs: Secondary | ICD-10-CM | POA: Diagnosis not present

## 2023-03-08 DIAGNOSIS — I10 Essential (primary) hypertension: Secondary | ICD-10-CM | POA: Insufficient documentation

## 2023-03-08 DIAGNOSIS — Z7985 Long-term (current) use of injectable non-insulin antidiabetic drugs: Secondary | ICD-10-CM | POA: Diagnosis not present

## 2023-03-08 DIAGNOSIS — I2510 Atherosclerotic heart disease of native coronary artery without angina pectoris: Secondary | ICD-10-CM | POA: Diagnosis present

## 2023-03-08 DIAGNOSIS — I080 Rheumatic disorders of both mitral and aortic valves: Secondary | ICD-10-CM | POA: Insufficient documentation

## 2023-03-08 HISTORY — PX: CORONARY STENT INTERVENTION: CATH118234

## 2023-03-08 HISTORY — PX: LEFT HEART CATH AND CORONARY ANGIOGRAPHY: CATH118249

## 2023-03-08 LAB — POCT ACTIVATED CLOTTING TIME: Activated Clotting Time: 337 s

## 2023-03-08 LAB — GLUCOSE, CAPILLARY: Glucose-Capillary: 111 mg/dL — ABNORMAL HIGH (ref 70–99)

## 2023-03-08 SURGERY — LEFT HEART CATH AND CORONARY ANGIOGRAPHY
Anesthesia: LOCAL

## 2023-03-08 MED ORDER — CLOPIDOGREL BISULFATE 75 MG PO TABS: 75.0000 mg | ORAL_TABLET | Freq: Every day | ORAL | Status: DC

## 2023-03-08 MED ORDER — CLOPIDOGREL BISULFATE 300 MG PO TABS
ORAL_TABLET | ORAL | Status: DC | PRN
  Administered 2023-03-08: 600 mg via ORAL

## 2023-03-08 MED ORDER — HEPARIN SODIUM (PORCINE) 1000 UNIT/ML IJ SOLN
INTRAMUSCULAR | Status: AC
  Filled 2023-03-08: qty 10

## 2023-03-08 MED ORDER — HEPARIN (PORCINE) IN NACL 1000-0.9 UT/500ML-% IV SOLN
INTRAVENOUS | Status: DC | PRN
  Administered 2023-03-08 (×2): 500 mL

## 2023-03-08 MED ORDER — HYDROCHLOROTHIAZIDE 25 MG PO TABS: 25.0000 mg | ORAL_TABLET | Freq: Every day | ORAL | Status: DC

## 2023-03-08 MED ORDER — NITROGLYCERIN 0.4 MG SL SUBL: 0.4000 mg | SUBLINGUAL_TABLET | SUBLINGUAL | Status: DC | PRN

## 2023-03-08 MED ORDER — VERAPAMIL HCL 2.5 MG/ML IV SOLN
INTRAVENOUS | Status: AC
  Filled 2023-03-08: qty 2

## 2023-03-08 MED ORDER — LEVOTHYROXINE SODIUM 50 MCG PO TABS: 50.0000 ug | ORAL_TABLET | Freq: Every day | ORAL | Status: DC

## 2023-03-08 MED ORDER — FAMOTIDINE IN NACL 20-0.9 MG/50ML-% IV SOLN
INTRAVENOUS | Status: AC
  Filled 2023-03-08: qty 50

## 2023-03-08 MED ORDER — FAMOTIDINE 20 MG PO TABS: 20.0000 mg | ORAL_TABLET | Freq: Two times a day (BID) | ORAL | Status: DC

## 2023-03-08 MED ORDER — NITROGLYCERIN 0.4 MG SL SUBL
SUBLINGUAL_TABLET | SUBLINGUAL | Status: AC
  Filled 2023-03-08: qty 3

## 2023-03-08 MED ORDER — SODIUM CHLORIDE 0.9 % WEIGHT BASED INFUSION: 1.0000 mL/kg/h | INTRAVENOUS | Status: DC

## 2023-03-08 MED ORDER — LOSARTAN POTASSIUM 50 MG PO TABS: 100.0000 mg | ORAL_TABLET | Freq: Every day | ORAL | Status: DC

## 2023-03-08 MED ORDER — ONDANSETRON HCL 4 MG/2ML IJ SOLN
4.0000 mg | Freq: Four times a day (QID) | INTRAMUSCULAR | Status: DC | PRN
Start: 2023-03-08 — End: 2023-03-08

## 2023-03-08 MED ORDER — EMPAGLIFLOZIN 25 MG PO TABS: 25.0000 mg | ORAL_TABLET | Freq: Every day | ORAL | Status: DC

## 2023-03-08 MED ORDER — ROSUVASTATIN CALCIUM 10 MG PO TABS: 10.0000 mg | ORAL_TABLET | Freq: Every day | ORAL | Status: DC

## 2023-03-08 MED ORDER — AMLODIPINE BESYLATE 5 MG PO TABS: 2.5000 mg | ORAL_TABLET | Freq: Every day | ORAL | Status: DC

## 2023-03-08 MED ORDER — FENTANYL CITRATE (PF) 100 MCG/2ML IJ SOLN
INTRAMUSCULAR | Status: DC | PRN
  Administered 2023-03-08 (×2): 25 ug via INTRAVENOUS

## 2023-03-08 MED ORDER — SODIUM CHLORIDE 0.9 % IV SOLN
INTRAVENOUS | Status: AC
Start: 2023-03-08 — End: 2023-03-08

## 2023-03-08 MED ORDER — VERAPAMIL HCL 2.5 MG/ML IV SOLN
INTRAVENOUS | Status: DC | PRN
  Administered 2023-03-08: 10 mL via INTRA_ARTERIAL

## 2023-03-08 MED ORDER — NITROGLYCERIN 0.4 MG SL SUBL
0.4000 mg | SUBLINGUAL_TABLET | SUBLINGUAL | 1 refills | Status: AC | PRN
  Filled 2023-03-08: qty 25, 8d supply, fill #0

## 2023-03-08 MED ORDER — CLOPIDOGREL BISULFATE 300 MG PO TABS
ORAL_TABLET | ORAL | Status: AC
  Filled 2023-03-08: qty 2

## 2023-03-08 MED ORDER — MIDAZOLAM HCL 2 MG/2ML IJ SOLN
INTRAMUSCULAR | Status: AC
  Filled 2023-03-08: qty 2

## 2023-03-08 MED ORDER — LIDOCAINE HCL (PF) 1 % IJ SOLN
INTRAMUSCULAR | Status: AC
  Filled 2023-03-08: qty 30

## 2023-03-08 MED ORDER — NITROGLYCERIN 1 MG/10 ML FOR IR/CATH LAB
INTRA_ARTERIAL | Status: AC
  Filled 2023-03-08: qty 10

## 2023-03-08 MED ORDER — FAMOTIDINE IN NACL 20-0.9 MG/50ML-% IV SOLN
INTRAVENOUS | Status: DC | PRN
  Administered 2023-03-08: 20 mg via INTRAVENOUS

## 2023-03-08 MED ORDER — FLUOXETINE HCL 40 MG PO CAPS: 40.0000 mg | ORAL_CAPSULE | Freq: Every day | ORAL | Status: DC

## 2023-03-08 MED ORDER — SODIUM CHLORIDE 0.9% FLUSH: 3.0000 mL | INTRAVENOUS | Status: DC | PRN

## 2023-03-08 MED ORDER — SODIUM CHLORIDE 0.9 % WEIGHT BASED INFUSION
3.0000 mL/kg/h | INTRAVENOUS | Status: AC
  Administered 2023-03-08: 3 mL/kg/h via INTRAVENOUS

## 2023-03-08 MED ORDER — HYDRALAZINE HCL 20 MG/ML IJ SOLN
10.0000 mg | INTRAMUSCULAR | Status: DC | PRN
Start: 2023-03-08 — End: 2023-03-08

## 2023-03-08 MED ORDER — CLOPIDOGREL BISULFATE 75 MG PO TABS
75.0000 mg | ORAL_TABLET | Freq: Every day | ORAL | 5 refills | Status: DC
  Filled 2023-03-08: qty 30, 30d supply, fill #0

## 2023-03-08 MED ORDER — CEFAZOLIN SODIUM-DEXTROSE 2-4 GM/100ML-% IV SOLN
INTRAVENOUS | Status: AC
  Filled 2023-03-08: qty 100

## 2023-03-08 MED ORDER — SODIUM CHLORIDE 0.9 % IV SOLN: 250.0000 mL | INTRAVENOUS | Status: DC | PRN

## 2023-03-08 MED ORDER — EZETIMIBE 10 MG PO TABS: 10.0000 mg | ORAL_TABLET | Freq: Every day | ORAL | Status: DC

## 2023-03-08 MED ORDER — ACETAMINOPHEN 325 MG PO TABS: 650.0000 mg | ORAL_TABLET | ORAL | Status: DC | PRN

## 2023-03-08 MED ORDER — SODIUM CHLORIDE 0.9% FLUSH: 3.0000 mL | Freq: Two times a day (BID) | INTRAVENOUS | Status: DC

## 2023-03-08 MED ORDER — HEPARIN SODIUM (PORCINE) 1000 UNIT/ML IJ SOLN
INTRAMUSCULAR | Status: DC | PRN
  Administered 2023-03-08: 3000 [IU] via INTRAVENOUS
  Administered 2023-03-08: 5000 [IU] via INTRAVENOUS

## 2023-03-08 MED ORDER — ASPIRIN 81 MG PO CHEW: 81.0000 mg | CHEWABLE_TABLET | Freq: Once | ORAL | Status: DC

## 2023-03-08 MED ORDER — MIDAZOLAM HCL 2 MG/2ML IJ SOLN
INTRAMUSCULAR | Status: DC | PRN
  Administered 2023-03-08 (×2): 1 mg via INTRAVENOUS

## 2023-03-08 MED ORDER — LIDOCAINE HCL (PF) 1 % IJ SOLN
INTRAMUSCULAR | Status: DC | PRN
  Administered 2023-03-08: 2 mL

## 2023-03-08 MED ORDER — PANTOPRAZOLE SODIUM 20 MG PO TBEC
20.0000 mg | DELAYED_RELEASE_TABLET | Freq: Every day | ORAL | 5 refills | Status: DC
  Filled 2023-03-08 – 2023-06-28 (×2): qty 30, 30d supply, fill #0

## 2023-03-08 MED ORDER — FENTANYL CITRATE (PF) 100 MCG/2ML IJ SOLN
INTRAMUSCULAR | Status: AC
  Filled 2023-03-08: qty 2

## 2023-03-08 MED ORDER — LABETALOL HCL 5 MG/ML IV SOLN: 10.0000 mg | INTRAVENOUS | Status: DC | PRN

## 2023-03-08 MED ORDER — ASPIRIN 81 MG PO TBEC: 81.0000 mg | DELAYED_RELEASE_TABLET | Freq: Every day | ORAL | Status: DC

## 2023-03-08 MED ORDER — ROSUVASTATIN CALCIUM 20 MG PO TABS
20.0000 mg | ORAL_TABLET | Freq: Every day | ORAL | 3 refills | Status: DC
  Filled 2023-03-08: qty 30, 30d supply, fill #0

## 2023-03-08 SURGICAL SUPPLY — 20 items
BALLN EMERGE MR 2.0X12 (BALLOONS) ×1
BALLN ~~LOC~~ EMERGE MR 2.5X12 (BALLOONS) ×1
BALLOON EMERGE MR 2.0X12 (BALLOONS) IMPLANT
BALLOON ~~LOC~~ EMERGE MR 2.5X12 (BALLOONS) IMPLANT
CATH 5FR JL3.5 JR4 ANG PIG MP (CATHETERS) IMPLANT
CATH VISTA GUIDE 6FR XBLAD3.5 (CATHETERS) IMPLANT
DEVICE RAD COMP TR BAND LRG (VASCULAR PRODUCTS) IMPLANT
GLIDESHEATH SLEND SS 6F .021 (SHEATH) IMPLANT
GUIDEWIRE INQWIRE 1.5J.035X260 (WIRE) IMPLANT
INQWIRE 1.5J .035X260CM (WIRE) ×1
KIT ENCORE 26 ADVANTAGE (KITS) IMPLANT
PACK CARDIAC CATHETERIZATION (CUSTOM PROCEDURE TRAY) ×1 IMPLANT
PROTECTION STATION PRESSURIZED (MISCELLANEOUS) ×1
SET ATX-X65L (MISCELLANEOUS) IMPLANT
SHEATH PROBE COVER 6X72 (BAG) IMPLANT
STATION PROTECTION PRESSURIZED (MISCELLANEOUS) IMPLANT
STENT SYNERGY XD 2.25X16 (Permanent Stent) IMPLANT
SYNERGY XD 2.25X16 (Permanent Stent) ×1 IMPLANT
WIRE ASAHI PROWATER 180CM (WIRE) IMPLANT
WIRE HI TORQ VERSACORE-J 145CM (WIRE) IMPLANT

## 2023-03-08 NOTE — Progress Notes (Signed)
CARDIAC REHAB PHASE I     Post stent education including site care, restrictions, risk factors, exercise guidelines, NTG use, antiplatelet therapy importance, heart healthy diabetic diet and CRP2 reviewed. All questions and concerns addressed. Will refer to [place] for CRP2. Plan for home later today, depending on CP symptoms.    [id number] Woodroe Chen, RN BSN 03/08/2023 11:56 AM

## 2023-03-08 NOTE — Progress Notes (Addendum)
Pt c/o of chest pain @1024 , 4/10 that was worsening. Protocol orders placed, EKG , [place] O2 , nitroglycerine SL given at 1026, 10;30 cp 0/10 10;38 pt resting comfortably,  / Even williams PA notified. 1120 pt has chest pain 2/10, left chest anterior to posterior ache feeling, pt states she feels weak. Valerie Echevaria NP/ Cards, was contacted, SL nitroglycerin given @1123 . 11;28 chest pain 0/10. O2 continued.

## 2023-03-08 NOTE — Discharge Summary (Signed)
Discharge Summary for Same Day PCI   Patient ID: [name] MRN: [id number]; DOB: 1945/10/11  Admit date: 03/08/2023 Discharge date: 03/08/2023  Primary Care Provider: [name], MD  Primary Cardiologist: Marlyn Corporal Madireddy, MD  Primary Electrophysiologist:  None   Discharge Diagnoses    Active Problems:   CAD (coronary artery disease)  CT Cardiac 01/19/2023 calcium score 411, plaque volume 366, moderate stenosis LAD, mild stenosis D1, RCA, LCx/OM 3    Diagnostic Studies/Procedures    Cardiac Catheterization 03/08/2023:    Ost RCA to Prox RCA lesion is 50% stenosed.   2nd Mrg lesion is 20% stenosed.   Mid Cx lesion is 20% stenosed.   Mid LAD-1 lesion is 30% stenosed.   Mid LAD-2 lesion is 50% stenosed.   Mid LAD-3 lesion is 99% stenosed.   A drug-eluting stent was successfully placed using a SYNERGY XD 2.25X16.   Post intervention, there is a 0% residual stenosis.   Severe mid LAD stenosis Successful PTCA/DES x 1 mid LAD Moderate stenosis ostial RCA. This does not appear to be flow limiting.    Recommendations: Continue DAPT with ASA and Plavix for at least six months. Same day post PCI discharge.   Diagnostic Dominance: Right  Intervention     _____________   History of Present Illness     Valerie Ball is a 77 y.o. female with moderate coronary artery disease noted on CT coronary angiogram January 19, 2023 with poorly modeled LAD by FFR, mild mitral stenosis, mild aortic insufficiency, hypertension, hyperlipidemia, hypothyroidism, irritable bowel syndrome, hiatal hernia s/p unsuccessful repair, seizure disorder, chronic thrombocytopenia, gastroparesis, NASH, chronic dizziness, chronic microvascular ischemic changes on CT head September 2024, hemorrhoids, palpitations with no major abnormalities on event monitor October 2024.  Patient saw Dr. Vincent Gros on 12/16 with worsening shortness of breath and chest heaviness with minimal activity at home.  Cardiac catheterization was arranged for further evaluation.  Hospital Course     The patient underwent cardiac cath as noted above with Dr. Clifton James. Plan for DAPT with ASA/Plavix for at least 6 months. The patient was seen by cardiac rehab while in short stay. There were no observed complications post cath. Radial cath site was re-evaluated prior to discharge and found to be stable without any complications. Instructions/precautions regarding cath site care were given prior to discharge.  [name] was seen by Dr. Clifton James and determined stable for discharge home. Follow up with our office has been arranged. Medications are listed below. Pertinent changes include new plavix therapy, switched to protonix. _____________  Cath/PCI Registry Performance & Quality Measures: Aspirin prescribed? - Yes ADP Receptor Inhibitor (Plavix/Clopidogrel, Brilinta/Ticagrelor or Effient/Prasugrel) prescribed (includes medically managed patients)? - Yes High Intensity Statin (Lipitor 40-80mg  or Crestor 20-40mg ) prescribed? - Yes For EF <40%, was ACEI/ARB prescribed? - Not Applicable (EF >/= 40%) For EF <40%, Aldosterone Antagonist (Spironolactone or Eplerenone) prescribed? - Not Applicable (EF >/= 40%) Cardiac Rehab Phase II ordered (Included Medically managed Patients)? - Yes  _____________   Discharge Vitals Blood pressure 111/73, pulse 72, temperature 98 F (36.7 C), temperature source Oral, resp. rate 14, height 5\' 2"  (1.575 m), weight 64.9 kg, last menstrual period 03/20/1981, SpO2 97%.  Filed Weights   03/08/23 0727  Weight: 64.9 kg    Last Labs & Radiologic Studies    CBC Recent Labs    03/05/23 1630  WBC 5.2  HGB 14.2  HCT 43.3  MCV 85  PLT 142*   Basic Metabolic Panel  Recent Labs    03/05/23 1630  NA 143  K 4.8  CL 102  CO2 25  GLUCOSE 118*  BUN 17  CREATININE 1.12*  CALCIUM 10.0   Liver Function Tests No results for input(s): "AST", "ALT", "ALKPHOS", "BILITOT",  "PROT", "ALBUMIN" in the last 72 hours. No results for input(s): "LIPASE", "AMYLASE" in the last 72 hours. High Sensitivity Troponin:   No results for input(s): "TROPONINIHS" in the last 720 hours.  BNP Invalid input(s): "POCBNP" D-Dimer No results for input(s): "DDIMER" in the last 72 hours. Hemoglobin A1C No results for input(s): "HGBA1C" in the last 72 hours. Fasting Lipid Panel No results for input(s): "CHOL", "HDL", "LDLCALC", "TRIG", "CHOLHDL", "LDLDIRECT" in the last 72 hours. Thyroid Function Tests No results for input(s): "TSH", "T4TOTAL", "T3FREE", "THYROIDAB" in the last 72 hours.  Invalid input(s): "FREET3" _____________  CARDIAC CATHETERIZATION Result Date: 03/08/2023   Ost RCA to Prox RCA lesion is 50% stenosed.   2nd Mrg lesion is 20% stenosed.   Mid Cx lesion is 20% stenosed.   Mid LAD-1 lesion is 30% stenosed.   Mid LAD-2 lesion is 50% stenosed.   Mid LAD-3 lesion is 99% stenosed.   A drug-eluting stent was successfully placed using a SYNERGY XD 2.25X16.   Post intervention, there is a 0% residual stenosis. Severe mid LAD stenosis Successful PTCA/DES x 1 mid LAD Moderate stenosis ostial RCA. This does not appear to be flow limiting. Recommendations: Continue DAPT with ASA and Plavix for at least six months. Same day post PCI discharge.    Disposition   Pt is being discharged home today in good condition.  Follow-up Plans & Appointments     Discharge Instructions     Amb Referral to Cardiac Rehabilitation   Complete by: As directed    Diagnosis: Coronary Stents   After initial evaluation and assessments completed: Virtual Based Care may be provided alone or in conjunction with Phase 2 Cardiac Rehab based on patient barriers.: Yes   Intensive Cardiac Rehabilitation (ICR) [facility] location only OR Traditional Cardiac Rehabilitation (TCR) *If criteria for ICR are not met will enroll in TCR [facility] only): Yes        Discharge Medications   Allergies as of 03/08/2023        Reactions   Arimidex [anastrozole] Other (See Comments)   Joint pain   Glipizide Other (See Comments)   Hypoglycemia   Metformin And Related Other (See Comments)   diarrhea   Penicillins Hives, Rash        Medication List     STOP taking these medications    omeprazole 20 MG capsule Commonly known as: PRILOSEC       TAKE these medications    amLODipine 2.5 MG tablet Commonly known as: NORVASC Take 1 tablet (2.5 mg total) by mouth daily.   aspirin EC 81 MG tablet Take 1 tablet (81 mg total) by mouth daily. Swallow whole.   B-12 1000 MCG Tabs Take 1,000 mcg by mouth daily.   CALCIUM 500 PO Take 500 mg by mouth daily.   clopidogrel 75 MG tablet Commonly known as: Plavix Take 1 tablet (75 mg total) by mouth daily.   empagliflozin 25 MG Tabs tablet Commonly known as: Jardiance Take 1 tablet (25 mg total) by mouth daily before breakfast.   ezetimibe 10 MG tablet Commonly known as: ZETIA TAKE 1 TABLET DAILY   famotidine 20 MG tablet Commonly known as: PEPCID Take 1 tablet (20 mg total) by mouth 2 (two) times  daily.   FLUoxetine 40 MG capsule Commonly known as: PROZAC Take 1 capsule (40 mg total) by mouth daily.   fluticasone 50 MCG/ACT nasal spray Commonly known as: FLONASE USE 2 SPRAYS IN EACH NOSTRIL DAILY AS NEEDED FOR ALLERGIES   hydrochlorothiazide 25 MG tablet Commonly known as: HYDRODIURIL TAKE 1 TABLET DAILY   levothyroxine 50 MCG tablet Commonly known as: SYNTHROID TAKE 1 TABLET DAILY BEFORE BREAKFAST   losartan 100 MG tablet Commonly known as: COZAAR TAKE 1 TABLET DAILY   multivitamin with minerals Tabs tablet Take 1 tablet by mouth daily.   nitroGLYCERIN 0.4 MG SL tablet Commonly known as: NITROSTAT Place 1 tablet (0.4 mg total) under the tongue every 5 (five) minutes as needed for chest pain.   pantoprazole 20 MG tablet Commonly known as: Protonix Take 1 tablet (20 mg total) by mouth daily.   repaglinide 1 MG  tablet Commonly known as: PRANDIN TAKE 1 TABLET DAILY BEFORE SUPPER   rOPINIRole 1 MG tablet Commonly known as: REQUIP TAKE 1 TABLET AT BEDTIME   rosuvastatin 20 MG tablet Commonly known as: Crestor Take 1 tablet (20 mg total) by mouth daily. What changed:  medication strength See the new instructions.   Trulicity 3 MG/0.5ML Soaj Generic drug: Dulaglutide INJECT 0.5 ML (3 MG) UNDER THE SKIN ONCE A WEEK   Vitamin D3 1000 units Caps Take 1,000 Units by mouth daily.           Allergies Allergies  Allergen Reactions   Arimidex [Anastrozole] Other (See Comments)    Joint pain   Glipizide Other (See Comments)    Hypoglycemia    Metformin And Related Other (See Comments)    diarrhea   Penicillins Hives and Rash    Outstanding Labs/Studies   FLP/LFTs in 8 weeks   Duration of Discharge Encounter   Greater than 30 minutes including physician time.  Signed, Laverda Page, NP 03/08/2023, 1:31 PM

## 2023-03-08 NOTE — Interval H&P Note (Signed)
History and Physical Interval Note:  03/08/2023 9:00 AM  Brandey Pesa  has presented today for surgery, with the diagnosis of abnormal ct.  The various methods of treatment have been discussed with the patient and family. After consideration of risks, benefits and other options for treatment, the patient has consented to  Procedure(s): LEFT HEART CATH AND CORONARY ANGIOGRAPHY (N/A) as a surgical intervention.  The patient's history has been reviewed, patient examined, no change in status, stable for surgery.  I have reviewed the patient's chart and labs.  Questions were answered to the patient's satisfaction.    Cath Lab Visit (complete for each Cath Lab visit)  Clinical Evaluation Leading to the Procedure:   ACS: No.  Non-ACS:    Anginal Classification: CCS II  Anti-ischemic medical therapy: Minimal Therapy (1 class of medications)  Non-Invasive Test Results: Intermediate-risk stress test findings: cardiac mortality 1-3%/year (Coronary cTA with possible flow limiting LAD stenosis)  Prior CABG: No previous CABG        Verne Carrow

## 2023-03-09 ENCOUNTER — Encounter (HOSPITAL_COMMUNITY): Payer: Self-pay | Admitting: Cardiovascular Disease

## 2023-03-09 MED FILL — Cefazolin Sodium-Dextrose IV Solution 2 GM/100ML-4%: INTRAVENOUS | Qty: 100 | Status: AC

## 2023-03-09 MED FILL — Nitroglycerin IV Soln 100 MCG/ML in D5W: INTRA_ARTERIAL | Qty: 10 | Status: AC

## 2023-03-20 ENCOUNTER — Telehealth (HOSPITAL_COMMUNITY): Payer: Self-pay

## 2023-03-20 NOTE — Telephone Encounter (Signed)
 Phase II referral for Cardiac Rehab faxed to [facility].

## 2023-03-23 ENCOUNTER — Ambulatory Visit (INDEPENDENT_AMBULATORY_CARE_PROVIDER_SITE_OTHER): Payer: Medicare HMO | Admitting: Family Medicine

## 2023-03-23 ENCOUNTER — Observation Stay (HOSPITAL_COMMUNITY)
Admission: EM | Admit: 2023-03-23 | Discharge: 2023-03-27 | Disposition: A | Discharge status: Home / Self Care | Payer: Medicare HMO | Attending: Internal Medicine | Admitting: Internal Medicine

## 2023-03-23 ENCOUNTER — Other Ambulatory Visit: Payer: Self-pay

## 2023-03-23 ENCOUNTER — Telehealth: Payer: Self-pay

## 2023-03-23 ENCOUNTER — Emergency Department (HOSPITAL_COMMUNITY): Payer: TRICARE For Life (TFL)

## 2023-03-23 ENCOUNTER — Encounter (HOSPITAL_COMMUNITY): Payer: Self-pay

## 2023-03-23 ENCOUNTER — Encounter: Payer: Self-pay | Admitting: Family Medicine

## 2023-03-23 VITALS — BP 96/50 | HR 86 | Temp 98.0°F | Ht 62.0 in | Wt 143.2 lb

## 2023-03-23 DIAGNOSIS — I129 Hypertensive chronic kidney disease with stage 1 through stage 4 chronic kidney disease, or unspecified chronic kidney disease: Secondary | ICD-10-CM | POA: Diagnosis not present

## 2023-03-23 DIAGNOSIS — E2839 Other primary ovarian failure: Secondary | ICD-10-CM

## 2023-03-23 DIAGNOSIS — Z8679 Personal history of other diseases of the circulatory system: Secondary | ICD-10-CM

## 2023-03-23 DIAGNOSIS — I25110 Atherosclerotic heart disease of native coronary artery with unstable angina pectoris: Principal | ICD-10-CM | POA: Insufficient documentation

## 2023-03-23 DIAGNOSIS — Z7902 Long term (current) use of antithrombotics/antiplatelets: Secondary | ICD-10-CM | POA: Diagnosis not present

## 2023-03-23 DIAGNOSIS — Z7982 Long term (current) use of aspirin: Secondary | ICD-10-CM | POA: Insufficient documentation

## 2023-03-23 DIAGNOSIS — K589 Irritable bowel syndrome without diarrhea: Secondary | ICD-10-CM | POA: Diagnosis not present

## 2023-03-23 DIAGNOSIS — E7849 Other hyperlipidemia: Secondary | ICD-10-CM | POA: Diagnosis not present

## 2023-03-23 DIAGNOSIS — E1165 Type 2 diabetes mellitus with hyperglycemia: Secondary | ICD-10-CM

## 2023-03-23 DIAGNOSIS — E119 Type 2 diabetes mellitus without complications: Secondary | ICD-10-CM

## 2023-03-23 DIAGNOSIS — D693 Immune thrombocytopenic purpura: Secondary | ICD-10-CM | POA: Insufficient documentation

## 2023-03-23 DIAGNOSIS — Z853 Personal history of malignant neoplasm of breast: Secondary | ICD-10-CM | POA: Insufficient documentation

## 2023-03-23 DIAGNOSIS — H5712 Ocular pain, left eye: Secondary | ICD-10-CM | POA: Diagnosis not present

## 2023-03-23 DIAGNOSIS — K449 Diaphragmatic hernia without obstruction or gangrene: Secondary | ICD-10-CM | POA: Diagnosis not present

## 2023-03-23 DIAGNOSIS — E039 Hypothyroidism, unspecified: Secondary | ICD-10-CM | POA: Insufficient documentation

## 2023-03-23 DIAGNOSIS — Z8616 Personal history of COVID-19: Secondary | ICD-10-CM | POA: Insufficient documentation

## 2023-03-23 DIAGNOSIS — H5710 Ocular pain, unspecified eye: Secondary | ICD-10-CM | POA: Insufficient documentation

## 2023-03-23 DIAGNOSIS — I10 Essential (primary) hypertension: Secondary | ICD-10-CM | POA: Diagnosis present

## 2023-03-23 DIAGNOSIS — E1122 Type 2 diabetes mellitus with diabetic chronic kidney disease: Secondary | ICD-10-CM | POA: Diagnosis not present

## 2023-03-23 DIAGNOSIS — N189 Chronic kidney disease, unspecified: Secondary | ICD-10-CM | POA: Insufficient documentation

## 2023-03-23 DIAGNOSIS — N179 Acute kidney failure, unspecified: Secondary | ICD-10-CM

## 2023-03-23 DIAGNOSIS — Z7985 Long-term (current) use of injectable non-insulin antidiabetic drugs: Secondary | ICD-10-CM | POA: Insufficient documentation

## 2023-03-23 DIAGNOSIS — F430 Acute stress reaction: Secondary | ICD-10-CM

## 2023-03-23 DIAGNOSIS — I200 Unstable angina: Secondary | ICD-10-CM

## 2023-03-23 DIAGNOSIS — E1142 Type 2 diabetes mellitus with diabetic polyneuropathy: Secondary | ICD-10-CM

## 2023-03-23 DIAGNOSIS — E538 Deficiency of other specified B group vitamins: Secondary | ICD-10-CM

## 2023-03-23 DIAGNOSIS — E1169 Type 2 diabetes mellitus with other specified complication: Secondary | ICD-10-CM

## 2023-03-23 DIAGNOSIS — E785 Hyperlipidemia, unspecified: Secondary | ICD-10-CM | POA: Diagnosis not present

## 2023-03-23 DIAGNOSIS — I700 Atherosclerosis of aorta: Secondary | ICD-10-CM | POA: Diagnosis not present

## 2023-03-23 DIAGNOSIS — Z955 Presence of coronary angioplasty implant and graft: Secondary | ICD-10-CM | POA: Diagnosis not present

## 2023-03-23 DIAGNOSIS — E1140 Type 2 diabetes mellitus with diabetic neuropathy, unspecified: Secondary | ICD-10-CM

## 2023-03-23 DIAGNOSIS — R0602 Shortness of breath: Secondary | ICD-10-CM | POA: Diagnosis not present

## 2023-03-23 DIAGNOSIS — N1831 Chronic kidney disease, stage 3a: Secondary | ICD-10-CM

## 2023-03-23 DIAGNOSIS — G40909 Epilepsy, unspecified, not intractable, without status epilepticus: Secondary | ICD-10-CM

## 2023-03-23 DIAGNOSIS — R079 Chest pain, unspecified: Secondary | ICD-10-CM

## 2023-03-23 DIAGNOSIS — R0789 Other chest pain: Secondary | ICD-10-CM | POA: Diagnosis not present

## 2023-03-23 DIAGNOSIS — D696 Thrombocytopenia, unspecified: Secondary | ICD-10-CM | POA: Diagnosis not present

## 2023-03-23 DIAGNOSIS — E1143 Type 2 diabetes mellitus with diabetic autonomic (poly)neuropathy: Secondary | ICD-10-CM

## 2023-03-23 HISTORY — DX: Personal history of other diseases of the circulatory system: Z86.79

## 2023-03-23 HISTORY — DX: Ocular pain, unspecified eye: H57.10

## 2023-03-23 HISTORY — DX: Immune thrombocytopenic purpura: D69.3

## 2023-03-23 LAB — CBC
HCT: 44.7 % (ref 36.0–46.0)
Hemoglobin: 14.6 g/dL (ref 12.0–15.0)
MCH: 27.9 pg (ref 26.0–34.0)
MCHC: 32.7 g/dL (ref 30.0–36.0)
MCV: 85.3 fL (ref 80.0–100.0)
Platelets: 166 10*3/uL (ref 150–400)
RBC: 5.24 MIL/uL — ABNORMAL HIGH (ref 3.87–5.11)
RDW: 15.2 % (ref 11.5–15.5)
WBC: 8.2 10*3/uL (ref 4.0–10.5)
nRBC: 0 % (ref 0.0–0.2)

## 2023-03-23 LAB — BASIC METABOLIC PANEL
Anion gap: 12 (ref 5–15)
BUN: 24 mg/dL — ABNORMAL HIGH (ref 8–23)
CO2: 25 mmol/L (ref 22–32)
Calcium: 9.7 mg/dL (ref 8.9–10.3)
Chloride: 103 mmol/L (ref 98–111)
Creatinine, Ser: 1.37 mg/dL — ABNORMAL HIGH (ref 0.44–1.00)
GFR, Estimated: 40 mL/min — ABNORMAL LOW (ref >60)
Glucose, Bld: 141 mg/dL — ABNORMAL HIGH (ref 70–99)
Potassium: 3.7 mmol/L (ref 3.5–5.1)
Sodium: 140 mmol/L (ref 135–145)

## 2023-03-23 LAB — TROPONIN I (HIGH SENSITIVITY)
Troponin I (High Sensitivity): 5 ng/L (ref <18)
Troponin I (High Sensitivity): 5 ng/L (ref <18)

## 2023-03-23 LAB — POCT GLUCOSE FINGERSTICK: Glucose: 233 — AB (ref 70–99)

## 2023-03-23 LAB — CBG MONITORING, ED: Glucose-Capillary: 100 mg/dL — ABNORMAL HIGH (ref 70–99)

## 2023-03-23 MED ORDER — CLOPIDOGREL BISULFATE 75 MG PO TABS
75.0000 mg | ORAL_TABLET | Freq: Every day | ORAL | Status: DC
  Administered 2023-03-23 – 2023-03-27 (×5): 75 mg via ORAL
  Filled 2023-03-23 (×6): qty 1

## 2023-03-23 MED ORDER — SODIUM CHLORIDE 0.9% FLUSH: 3.0000 mL | INTRAVENOUS | Status: DC | PRN

## 2023-03-23 MED ORDER — ONDANSETRON HCL 4 MG/2ML IJ SOLN: 4.0000 mg | Freq: Four times a day (QID) | INTRAMUSCULAR | Status: DC | PRN

## 2023-03-23 MED ORDER — LEVOTHYROXINE SODIUM 50 MCG PO TABS
50.0000 ug | ORAL_TABLET | Freq: Every day | ORAL | Status: DC
  Administered 2023-03-24 – 2023-03-27 (×4): 50 ug via ORAL
  Filled 2023-03-23 (×4): qty 1

## 2023-03-23 MED ORDER — ACETAMINOPHEN 325 MG PO TABS
650.0000 mg | ORAL_TABLET | Freq: Four times a day (QID) | ORAL | Status: DC | PRN
  Administered 2023-03-24 – 2023-03-26 (×3): 650 mg via ORAL
  Filled 2023-03-23 (×4): qty 2

## 2023-03-23 MED ORDER — NITROGLYCERIN 0.4 MG SL SUBL: 0.4000 mg | SUBLINGUAL_TABLET | SUBLINGUAL | Status: DC | PRN

## 2023-03-23 MED ORDER — HEPARIN SODIUM (PORCINE) 5000 UNIT/ML IJ SOLN
5000.0000 [IU] | Freq: Three times a day (TID) | INTRAMUSCULAR | Status: DC
  Administered 2023-03-23 – 2023-03-26 (×10): 5000 [IU] via SUBCUTANEOUS
  Filled 2023-03-23 (×11): qty 1

## 2023-03-23 MED ORDER — PANTOPRAZOLE SODIUM 20 MG PO TBEC
20.0000 mg | DELAYED_RELEASE_TABLET | Freq: Every day | ORAL | Status: DC
Start: 2023-03-23 — End: 2023-03-27
  Administered 2023-03-23 – 2023-03-27 (×5): 20 mg via ORAL
  Filled 2023-03-23 (×5): qty 1

## 2023-03-23 MED ORDER — ASPIRIN 81 MG PO TBEC
81.0000 mg | DELAYED_RELEASE_TABLET | Freq: Every day | ORAL | Status: DC
  Administered 2023-03-23 – 2023-03-27 (×5): 81 mg via ORAL
  Filled 2023-03-23 (×6): qty 1

## 2023-03-23 MED ORDER — ISOSORBIDE MONONITRATE ER 30 MG PO TB24
30.0000 mg | ORAL_TABLET | Freq: Every day | ORAL | Status: DC
  Administered 2023-03-23 – 2023-03-24 (×2): 30 mg via ORAL
  Filled 2023-03-23 (×2): qty 1

## 2023-03-23 MED ORDER — SODIUM CHLORIDE 0.9 % IV SOLN: 250.0000 mL | INTRAVENOUS | Status: AC | PRN

## 2023-03-23 MED ORDER — EZETIMIBE 10 MG PO TABS
10.0000 mg | ORAL_TABLET | Freq: Every day | ORAL | Status: DC
  Administered 2023-03-24 – 2023-03-27 (×4): 10 mg via ORAL
  Filled 2023-03-23 (×4): qty 1

## 2023-03-23 MED ORDER — INSULIN ASPART 100 UNIT/ML IJ SOLN
0.0000 [IU] | Freq: Three times a day (TID) | INTRAMUSCULAR | Status: DC
Start: 2023-03-24 — End: 2023-03-27

## 2023-03-23 MED ORDER — SODIUM CHLORIDE 0.9% FLUSH
3.0000 mL | Freq: Two times a day (BID) | INTRAVENOUS | Status: DC
  Administered 2023-03-23 – 2023-03-26 (×6): 3 mL via INTRAVENOUS

## 2023-03-23 MED ORDER — SODIUM CHLORIDE 0.9 % IV SOLN: INTRAVENOUS | Status: DC

## 2023-03-23 MED ORDER — ONDANSETRON HCL 4 MG PO TABS: 4.0000 mg | ORAL_TABLET | Freq: Four times a day (QID) | ORAL | Status: DC | PRN

## 2023-03-23 MED ORDER — MORPHINE SULFATE (PF) 4 MG/ML IV SOLN
4.0000 mg | Freq: Once | INTRAVENOUS | Status: AC
Start: 2023-03-23 — End: 2023-03-23
  Administered 2023-03-23: 4 mg via INTRAVENOUS
  Filled 2023-03-23: qty 1

## 2023-03-23 MED ORDER — CLOPIDOGREL BISULFATE 75 MG PO TABS: 75.0000 mg | ORAL_TABLET | Freq: Every day | ORAL | Status: DC

## 2023-03-23 MED ORDER — MORPHINE SULFATE (PF) 2 MG/ML IV SOLN
2.0000 mg | Freq: Once | INTRAVENOUS | Status: AC
  Administered 2023-03-23: 2 mg via INTRAVENOUS
  Filled 2023-03-23: qty 1

## 2023-03-23 MED ORDER — FLUOXETINE HCL 20 MG PO CAPS
40.0000 mg | ORAL_CAPSULE | Freq: Every day | ORAL | Status: DC
Start: 2023-03-23 — End: 2023-03-27
  Administered 2023-03-23 – 2023-03-27 (×5): 40 mg via ORAL
  Filled 2023-03-23 (×5): qty 2

## 2023-03-23 MED ORDER — ROPINIROLE HCL 1 MG PO TABS
1.0000 mg | ORAL_TABLET | Freq: Every day | ORAL | Status: DC
  Administered 2023-03-23 – 2023-03-26 (×4): 1 mg via ORAL
  Filled 2023-03-23 (×4): qty 1

## 2023-03-23 MED ORDER — ASPIRIN 81 MG PO TBEC: 81.0000 mg | DELAYED_RELEASE_TABLET | Freq: Every day | ORAL | Status: DC

## 2023-03-23 MED ORDER — ROSUVASTATIN CALCIUM 20 MG PO TABS
20.0000 mg | ORAL_TABLET | Freq: Every day | ORAL | Status: DC
Start: 2023-03-23 — End: 2023-03-27
  Administered 2023-03-23 – 2023-03-27 (×5): 20 mg via ORAL
  Filled 2023-03-23 (×5): qty 1

## 2023-03-23 MED ORDER — INSULIN ASPART 100 UNIT/ML IJ SOLN
0.0000 [IU] | Freq: Every day | INTRAMUSCULAR | Status: DC
  Administered 2023-03-24: 2 [IU] via SUBCUTANEOUS

## 2023-03-23 MED ORDER — ACETAMINOPHEN 650 MG RE SUPP: 650.0000 mg | Freq: Four times a day (QID) | RECTAL | Status: DC | PRN

## 2023-03-23 MED ORDER — DOCUSATE SODIUM 100 MG PO CAPS
100.0000 mg | ORAL_CAPSULE | Freq: Two times a day (BID) | ORAL | Status: DC
  Administered 2023-03-23 – 2023-03-27 (×5): 100 mg via ORAL
  Filled 2023-03-23 (×7): qty 1

## 2023-03-23 NOTE — ED Notes (Signed)
 Attempted x 4 to call pt to be roomed with no answer in lobby

## 2023-03-23 NOTE — Patient Instructions (Signed)
 Head over to [facility] ER for evaluation now  We will let them know you are coming

## 2023-03-23 NOTE — Assessment & Plan Note (Signed)
 Results for orders placed or performed in visit on 03/23/23  POCT Glucose Fingerstick   Collection Time: 03/23/23  3:01 PM  Result Value Ref Range   Glucose 233 (A) 70 - 99

## 2023-03-23 NOTE — Progress Notes (Signed)
 Subjective:    Patient ID: Valerie Ball, female    DOB: 1945/04/08, 78 y.o.   MRN: [id number]  HPI  Wt Readings from Last 3 Encounters:  03/23/23 143 lb 4 oz (65 kg)  03/08/23 143 lb (64.9 kg)  03/05/23 145 lb 3.2 oz (65.9 kg)   26.20 kg/m  Vitals:   03/23/23 1433  BP: (!) 96/50  Pulse: 86  Temp: 98 F (36.7 C)  SpO2: 96%    Pt presents for follow up of stress reaction /mood  Upon getting here admits to having cp since 3 d post cath / feeling very tired and having eye pain on left this am    Last visit noted increased stress from husband's mental health issues and some of her own health problems   Had been on sertraline  long term  We changed to fluoxetine  40 mg daily   Was planning some cardiac testing before recommending any exercise  She did end up needing a cardiac cath  Noted mid LAD 3 lesion was 99% stenosed so a drug eluding stent was placed   Per pt she felt really good for a few days then badly again    Had a pain in left eye today  Vision is unchanged - has baseline vision loss on that side     Blood pressure is low today also  Feels light headed  Staggering  Still has some chest pain -since 3 d post stent  Now chest pressure    BP Readings from Last 3 Encounters:  03/23/23 (!) 96/50  03/08/23 98/62  03/05/23 128/74   Amlodipine  2.5 mg daily for anti anginal regimen  Hydrochlorothiazide  25 mg daily  Losartan  100 mg daily   Plavix  75 mg daily  Pulse Readings from Last 3 Encounters:  03/23/23 86  03/08/23 70  03/05/23 84   EKG today-no acute change from last (post cath) EKG  Old inf infarct QT is not prolonged     Lab Results  Component Value Date   WBC 5.2 03/05/2023   HGB 14.2 03/05/2023   HCT 43.3 03/05/2023   MCV 85 03/05/2023   PLT 142 (L) 03/05/2023   Lab Results  Component Value Date   NA 143 03/05/2023   K 4.8 03/05/2023   CO2 25 03/05/2023   GLUCOSE 233 (A) 03/23/2023   BUN 17 03/05/2023   CREATININE 1.12 (H)  03/05/2023   CALCIUM  10.0 03/05/2023   GFR 46.38 (L) 05/04/2022   EGFR 51 (L) 03/05/2023   GFRNONAA 47 (L) 12/11/2022   Lab Results  Component Value Date   ALT 31 12/11/2022   AST 38 12/11/2022   ALKPHOS 76 12/11/2022   BILITOT 0.5 12/11/2022    Glucose today 233 Patient Active Problem List   Diagnosis Date Noted   Eye pain 03/23/2023   Mitral stenosis and aortic incompetence, by TTE 01/25/2023 02/01/2023   CAD (coronary artery disease)  CT Cardiac 01/19/2023 calcium  score 411, plaque volume 366, moderate stenosis LAD, mild stenosis D1, RCA, LCx/OM 3; S/p PCI of Mid LAD 03/08/2023 02/01/2023   Poor balance 01/30/2023   Dyspnea on exertion 01/04/2023   Palpitations 01/04/2023   Allergy    Angiodysplasia of colon    Anxiety    Arthritis    Borderline diabetic    Colon polyp    Diverticulosis    GI bleed    Hepatic steatosis    Hiatal hernia    Hyperlipidemia    Hypertension    Macular  edema    Menopause    Postmenopausal hormone therapy    Seizure disorder (HCC)    Seizures (HCC)    Chest pain 12/29/2022   Bilateral knee pain 05/11/2022   Decreased calculated GFR 05/11/2022   Blurred vision, right eye 08/11/2021   History of COVID-19 09/24/2020   Fever, low grade 09/24/2020   Posterior vitreous detachment of right eye 11/06/2019   Cystoid macular edema of left eye 07/14/2019   Branch retinal vein occlusion with macular edema of left eye 07/14/2019   Hypertensive retinopathy of left eye 07/14/2019   Vitreous floaters of left eye 07/14/2019   Fatigue 05/20/2018   Hip pain, bilateral 08/31/2017   Vitamin B12 deficiency 09/21/2016   Diabetic gastroparesis (HCC) 03/27/2016   Retinal vein occlusion, branch 01/03/2016   Alternating constipation and diarrhea 08/12/2015   AVM (arteriovenous malformation) of colon 05/18/2015   IBS (irritable bowel syndrome) 02/28/2015   Diverticulitis of colon 10/14/2014   Gout 07/22/2014   Hypothyroidism 04/23/2014   Encounter for  Medicare annual wellness exam 01/23/2014   Estrogen deficiency 01/23/2014   Colon cancer screening 01/23/2014   Tremor 01/20/2014   Dizziness 01/07/2014   Allergic rhinitis 01/07/2014   Thrombocytopenia (HCC) 11/25/2013   Diabetic neuropathy (HCC) 11/25/2013   History of removal of implants of both breasts 07/01/2013   Lap Nissen October 2013 01/19/2012   Large type III mixed hiatus hernia  12/06/2011   Stress reaction 10/25/2011   Routine general medical examination at a health care facility 07/11/2011   TRANSAMINASES, SERUM, ELEVATED 11/11/2009   History of breast cancer 08/02/2009   Breast cancer (HCC) 02/2009   ESOPHAGEAL STRICTURE 03/02/2008   Diaphragmatic hernia 03/02/2008   Peritoneal adhesions 03/02/2008   Anemia 02/07/2008   Melena 01/2008   Poorly controlled type 2 diabetes mellitus with neuropathy (HCC) 08/19/2007   GAD (generalized anxiety disorder) 04/15/2007   DISORDERS, ORGANIC SLEEP NEC 08/23/2006   SYNDROME, RESTLESS LEGS 08/23/2006   SYNDROME, CARPAL TUNNEL 08/23/2006   Hyperlipidemia associated with type 2 diabetes mellitus (HCC) 08/22/2006   Essential hypertension 08/22/2006   Allergic rhinitis 08/22/2006   Fatty liver 08/22/2006   History of seizure 08/22/2006   Colon stricture (HCC) 01/14/2003   Salmonella 05/2000   Past Medical History:  Diagnosis Date   Allergic rhinitis 08/22/2006   Qualifier: Diagnosis of   By: Cecilie CMA, NANNIE Ivy      IMO SNOMED Dx Update Oct 2024     Allergy    allegic rhinitis   Alternating constipation and diarrhea 08/12/2015   Anemia    Angiodysplasia of colon    Anxiety    Arthritis    AVM (arteriovenous malformation) of colon    Bilateral knee pain 05/11/2022   Blurred vision, right eye 08/11/2021   Patient does have blurred vision right eye,     Borderline diabetic    per patient medical history form   Branch retinal vein occlusion with macular edema of left eye 07/14/2019   CME superior to the fovea OS has  worsened at 7-week interval from macular branch retinal vein occlusion, repeat Avastin  today and evaluate exam in 6 weeks     Breast cancer (HCC) 02/2009   breast CA L invasive ductal CA(hormone receptor positive.)   CAD (coronary artery disease)  CT Cardiac 01/19/2023 calcium  score 411, plaque volume 366, moderate stenosis LAD, mild stenosis D1, RCA, LCx/OM 3 02/01/2023   Chest pain 12/29/2022   Colon cancer screening 01/23/2014   Colon polyp  hyperplastic   Colon stricture (HCC) 01/14/2003   Cystoid macular edema of left eye 07/14/2019   Decreased calculated GFR 05/11/2022   Diabetic gastroparesis (HCC) 03/27/2016   Typical symptoms -suspect      No detectable diabetic retinopathy 12-27-2020     Diabetic neuropathy (HCC) 11/25/2013   Diaphragmatic hernia 03/02/2008   Qualifier: Diagnosis of   By: Genie CMA LEODIS), Chick      IMO SNOMED Dx Update Oct 2024     Diverticulitis of colon 10/14/2014   Diverticulosis    Dizziness 01/07/2014   Dyspnea on exertion 01/04/2023   Encounter for Medicare annual wellness exam 01/23/2014   ESOPHAGEAL STRICTURE 03/02/2008   Qualifier: Diagnosis of   By: Genie CMA (AAMA), Chick         Essential hypertension 08/22/2006   Qualifier: Diagnosis of   By: Cecilie CMA, NANNIE Ivy         Estrogen deficiency 01/23/2014   Fatigue 05/20/2018   Fatty liver    Fever, low grade 09/24/2020   GAD (generalized anxiety disorder) 04/15/2007   Qualifier: Diagnosis of   By: Baird FNP, Frieda Kipper        GI bleed    Gout    Hepatic steatosis    Hiatal hernia    Hip pain, bilateral 08/31/2017   History of breast cancer 08/02/2009   Qualifier: Diagnosis of   By: Randeen MD, Laine Caldron        History of COVID-19 09/24/2020   History of removal of implants of both breasts 07/01/2013   06/25/13 elected to have bilateral breast implants removed.     History of seizure 08/22/2006   Qualifier: Diagnosis of   By: Cecilie CMA, NANNIE Ivy          Hyperlipidemia    Hyperlipidemia associated with type 2 diabetes mellitus (HCC) 08/22/2006   Qualifier: Diagnosis of   By: Cecilie CMA, NANNIE Ivy         Hypertension    Hypertensive retinopathy of left eye 07/14/2019   Hypothyroidism    IBS (irritable bowel syndrome)    Lap Nissen October 2013 01/19/2012   Laparoscopic takedown of large type III mixed hiatus hernia with primary closure of the diaphragm with 3 sutures posteriorally (post 2 with pledgets) and Nissen fundoplication over a #56 lighted bougie        Large type III mixed hiatus hernia  12/06/2011   Plan surgery to repair diaphragm and do Nissen fundoplication     Macular edema    left eye   Melena 01/2008   anemia transfusion   Menopause    per patient medical history form   Mitral stenosis and aortic incompetence, by TTE 01/25/2023 02/01/2023   Palpitations 01/04/2023   Peritoneal adhesions 03/02/2008   Qualifier: Diagnosis of   By: Genie CMA LEODIS), Leisha      IMO SNOMED Dx Update Oct 2024     Poor balance 01/30/2023   Poorly controlled type 2 diabetes mellitus with neuropathy (HCC) 08/19/2007   Qualifier: Diagnosis of   By: Randeen MD, Laine Caldron        Posterior vitreous detachment of right eye 11/06/2019   Postmenopausal hormone therapy    per patient medical history form.   Retinal vein occlusion, branch 01/03/2016   Left with hemorrhage and edema (Dr Elner) 2017     Routine general medical examination at a health care facility 07/11/2011   Salmonella 05/2000   renal effects secondary to dehydration  Seizure disorder (HCC)    Seizures (HCC)    one seizure several yrs ago --etiology unknown-no problem since   Stress reaction 10/25/2011   SYNDROME, CARPAL TUNNEL 08/23/2006   Qualifier: Diagnosis of   By: Randeen MD, Laine Caldron        SYNDROME, RESTLESS LEGS 08/23/2006   Qualifier: Diagnosis of   By: Randeen MD, Laine Caldron        Thrombocytopenia (HCC) 11/25/2013   TRANSAMINASES, SERUM, ELEVATED 11/11/2009    Qualifier: Diagnosis of   By: Randeen MD, Laine Caldron        Tremor 01/20/2014   Head and right hand      Vitamin B12 deficiency 09/21/2016   Vitreous floaters of left eye 07/14/2019   The nature of vitreous floaters was discussed with the patient as well as their frequent occurrence with development of posterior vitreous detachment. The significance of flashes and field cuts was discussed with the patient. The need for a dilated fundus examination was discussed and advice given to return immediately for new or different floaters .  More uncommonly, vitreous floaters, debris, or   Past Surgical History:  Procedure Laterality Date   ABDOMINAL HYSTERECTOMY  1983   BREAST SURGERY  02/2009   breast biopsy invasive ductal CA-bilateral mastectomies   CORONARY STENT INTERVENTION N/A 03/08/2023   Procedure: CORONARY STENT INTERVENTION;  Surgeon: Verlin Lonni BIRCH, MD;  Location: [facility];  Service: Cardiovascular;  Laterality: N/A;   EPIGASTRIC HERNIA REPAIR  01/02/2012   Procedure: HERNIA REPAIR EPIGASTRIC ADULT;  Surgeon: Donnice KATHEE Lunger, MD;  Location: [facility];  Service: General;  Laterality: N/A;  Laparoscopic Repair Paraesophageal Hernia, Nissen   EYE SURGERY Left 07/2017   Dr. Elner GULA CYST EXCISION     LAPAROSCOPIC NISSEN FUNDOPLICATION  01/02/2012   Procedure: LAPAROSCOPIC NISSEN FUNDOPLICATION;  Surgeon: Donnice KATHEE Lunger, MD;  Location: [facility];  Service: General;  Laterality: N/A;   LEFT HEART CATH AND CORONARY ANGIOGRAPHY N/A 03/08/2023   Procedure: LEFT HEART CATH AND CORONARY ANGIOGRAPHY;  Surgeon: Verlin Lonni BIRCH, MD;  Location: [facility];  Service: Cardiovascular;  Laterality: N/A;   MASTECTOMY  04/2009   Bilateral for ductal carcinoma on L   OVARY SURGERY  1986   ovarian tumor removed   TUBAL LIGATION     Social History   Tobacco Use   Smoking status: Never   Smokeless tobacco: Never  Vaping Use   Vaping status: Never Used  Substance  Use Topics   Alcohol use: Yes    Comment: occasional   Drug use: Never   Family History  Problem Relation Age of Onset   Hypertension Mother    Stroke Mother    Breast cancer Mother    Heart attack Father    Diabetes Father    Esophageal cancer Father    Breast cancer Sister        x 2 sisters   Diabetes Sister    Hypertension Sister    Breast cancer Sister    Hypertension Brother    Diabetes Brother    Allergies  Allergen Reactions   Arimidex [Anastrozole] Other (See Comments)    Joint pain   Glipizide  Other (See Comments)    Hypoglycemia    Metformin  And Related Other (See Comments)    diarrhea   Penicillins Hives and Rash   Current Outpatient Medications on File Prior to Visit  Medication Sig Dispense Refill   amLODipine  (NORVASC ) 2.5 MG tablet  Take 1 tablet (2.5 mg total) by mouth daily. 90 tablet 3   aspirin  EC 81 MG tablet Take 1 tablet (81 mg total) by mouth daily. Swallow whole.     Calcium  Carbonate (CALCIUM  500 PO) Take 500 mg by mouth daily.     Cholecalciferol (VITAMIN D3) 1000 units CAPS Take 1,000 Units by mouth daily.     clopidogrel  (PLAVIX ) 75 MG tablet Take 1 tablet (75 mg total) by mouth daily. 30 tablet 5   Cyanocobalamin  (B-12) 1000 MCG TABS Take 1,000 mcg by mouth daily.     Dulaglutide  (TRULICITY ) 3 MG/0.5ML SOPN INJECT 0.5 ML (3 MG) UNDER THE SKIN ONCE A WEEK 6 mL 3   empagliflozin  (JARDIANCE ) 25 MG TABS tablet Take 1 tablet (25 mg total) by mouth daily before breakfast. 90 tablet 3   ezetimibe  (ZETIA ) 10 MG tablet TAKE 1 TABLET DAILY 90 tablet 3   famotidine  (PEPCID ) 20 MG tablet Take 1 tablet (20 mg total) by mouth 2 (two) times daily. 180 tablet 1   FLUoxetine  (PROZAC ) 40 MG capsule Take 1 capsule (40 mg total) by mouth daily. 90 capsule 0   fluticasone  (FLONASE ) 50 MCG/ACT nasal spray USE 2 SPRAYS IN EACH NOSTRIL DAILY AS NEEDED FOR ALLERGIES 48 g 0   hydrochlorothiazide  (HYDRODIURIL ) 25 MG tablet TAKE 1 TABLET DAILY 90 tablet 2    levothyroxine  (SYNTHROID ) 50 MCG tablet TAKE 1 TABLET DAILY BEFORE BREAKFAST 90 tablet 1   losartan  (COZAAR ) 100 MG tablet TAKE 1 TABLET DAILY 90 tablet 2   Multiple Vitamin (MULTIVITAMIN WITH MINERALS) TABS tablet Take 1 tablet by mouth daily.     nitroGLYCERIN  (NITROSTAT ) 0.4 MG SL tablet Place 1 tablet (0.4 mg total) under the tongue every 5 (five) minutes as needed for chest pain. 25 tablet 1   pantoprazole  (PROTONIX ) 20 MG tablet Take 1 tablet (20 mg total) by mouth daily. 30 tablet 5   repaglinide  (PRANDIN ) 1 MG tablet TAKE 1 TABLET DAILY BEFORE SUPPER 90 tablet 3   rOPINIRole  (REQUIP ) 1 MG tablet TAKE 1 TABLET AT BEDTIME 90 tablet 3   rosuvastatin  (CRESTOR ) 20 MG tablet Take 1 tablet (20 mg total) by mouth daily. 30 tablet 3   No current facility-administered medications on file prior to visit.    Review of Systems  Constitutional:  Positive for fatigue. Negative for activity change, appetite change, fever and unexpected weight change.  HENT:  Negative for congestion, ear pain, rhinorrhea, sinus pressure and sore throat.   Eyes:  Negative for pain, redness and visual disturbance.  Respiratory:  Negative for cough, shortness of breath and wheezing.   Cardiovascular:  Positive for chest pain. Negative for palpitations and leg swelling.  Gastrointestinal:  Negative for abdominal pain, blood in stool, constipation and diarrhea.  Endocrine: Negative for polydipsia and polyuria.  Genitourinary:  Negative for dysuria, frequency and urgency.  Musculoskeletal:  Negative for arthralgias, back pain and myalgias.  Skin:  Negative for pallor and rash.  Allergic/Immunologic: Negative for environmental allergies.  Neurological:  Positive for light-headedness and headaches. Negative for dizziness and syncope.  Hematological:  Negative for adenopathy. Does not bruise/bleed easily.  Psychiatric/Behavioral:  Positive for dysphoric mood. Negative for decreased concentration. The patient is  nervous/anxious.        Mood overall improved from last time Sleeping better         Objective:   Physical Exam Constitutional:      General: She is not in acute distress.    Appearance: Normal appearance.  She is well-developed and normal weight. She is ill-appearing. She is not toxic-appearing or diaphoretic.  HENT:     Head: Normocephalic and atraumatic.  Eyes:     Conjunctiva/sclera: Conjunctivae normal.     Pupils: Pupils are equal, round, and reactive to light.  Neck:     Thyroid : No thyromegaly.     Vascular: No carotid bruit or JVD.  Cardiovascular:     Rate and Rhythm: Normal rate and regular rhythm.     Heart sounds: Normal heart sounds.     No gallop.  Pulmonary:     Effort: Pulmonary effort is normal. No respiratory distress.     Breath sounds: Normal breath sounds. No wheezing or rales.  Abdominal:     General: There is no distension or abdominal bruit.     Palpations: Abdomen is soft.  Musculoskeletal:     Cervical back: Normal range of motion and neck supple.     Right lower leg: No edema.     Left lower leg: No edema.  Lymphadenopathy:     Cervical: No cervical adenopathy.  Skin:    General: Skin is warm and dry.     Coloration: Skin is not pale.     Findings: No rash.  Neurological:     Mental Status: She is alert.     Cranial Nerves: No cranial nerve deficit.     Motor: No weakness.     Coordination: Coordination normal.     Deep Tendon Reflexes: Reflexes are normal and symmetric. Reflexes normal.     Comments: Off balance Ambulates holding on to husband   Psychiatric:        Mood and Affect: Mood normal.     Comments: Seems fatigued  Less down and less anxious            Assessment & Plan:   Problem List Items Addressed This Visit       Cardiovascular and Mediastinum   Essential hypertension   Blood pressure low today  BP: (!) 96/50  Going to ER for cp  Currently  Amlodipine  2.5 mg daily (anti anginal) Hydrochlorothiazide  25 mg  daily Losartan  100 mg       CAD (coronary artery disease)  CT Cardiac 01/19/2023 calcium  score 411, plaque volume 366, moderate stenosis LAD, mild stenosis D1, RCA, LCx/OM 3; S/p PCI of Mid LAD 03/08/2023   Pt had cath and stent on 12/19 Today feels poorly complaining of cp and pressure (since 3 d post cath)  EKG-no acute changes Glucose ove 200 Wants to go to ER for eval Is stable and able to ambulate (however blood pressure is low)  and request her husband drive her instead of calling 911 Heading to [place]         Relevant Orders   EKG 12-Lead (Completed)     Endocrine   Poorly controlled type 2 diabetes mellitus with neuropathy (HCC) - Primary   Results for orders placed or performed in visit on 03/23/23  POCT Glucose Fingerstick   Collection Time: 03/23/23  3:01 PM  Result Value Ref Range   Glucose 233 (A) 70 - 99         Relevant Orders   POCT Glucose Fingerstick (Completed)     Other   Stress reaction   Doing better on fluoxetine  than sertraline  Sleeping better  Has had heart problems since last visit which is stressful       Eye pain   Left Occurred this am Sharp in  nature  Retinal vein occlusion on this side in past and baseline vision deficit  No facial droop or speech change here today  Also has cp and is headed to [place] now       Chest pain   Chest pain and pressure since 3 days after cath and LAD stent (12/19) Taking low dose amlodipine   Blood pressure is low  EKG -no acute changes   Desires ER - declines 911, pt's husband is taking her now to [place]

## 2023-03-23 NOTE — ED Provider Triage Note (Signed)
 Emergency Medicine Provider Triage Evaluation Note  Valerie Ball , a 78 y.o. female  was evaluated in triage.  Pt complains of CP.  Review of Systems  Positive: Pain, SOB, nausea Negative: Fever, congestion  Physical Exam  BP 105/63 (BP Location: Right Arm)   Pulse 88   Temp 98.2 F (36.8 C) (Oral)   Resp 15   Ht 5' 2 (1.575 m)   Wt 64.9 kg   LMP 03/20/1981   SpO2 97%   BMI 26.16 kg/m  Gen:   Awake, no distress   Resp:  Normal effort  MSK:   Moves extremities without difficulty  Other:    Medical Decision Making  Medically screening exam initiated at 4:19 PM.  Appropriate orders placed.  Avelina MARLA Justin was informed that the remainder of the evaluation will be completed by another provider, this initial triage assessment does not replace that evaluation, and the importance of remaining in the ED until their evaluation is complete.  Patient with chest pain x 2 1/2 weeks since cardiac cath done with stent placement:  Cardiac Catheterization 03/08/2023:     Ost RCA to Prox RCA lesion is 50% stenosed.   2nd Mrg lesion is 20% stenosed.   Mid Cx lesion is 20% stenosed.   Mid LAD-1 lesion is 30% stenosed.   Mid LAD-2 lesion is 50% stenosed.   Mid LAD-3 lesion is 99% stenosed.   A drug-eluting stent was successfully placed using a SYNERGY XD 2.25X16.   Post intervention, there is a 0% residual stenosis.   Severe mid LAD stenosis Successful PTCA/DES x 1 mid LAD Moderate stenosis ostial RCA. This does not appear to be flow limiting.    Recommendations: Continue DAPT with ASA and Plavix  for at least six months. Same day post PCI discharge.    Odell Balls, PA-C 03/23/23 1622

## 2023-03-23 NOTE — Assessment & Plan Note (Signed)
 Doing better on fluoxetine than sertraline Sleeping better  Has had heart problems since last visit which is stressful

## 2023-03-23 NOTE — H&P (Addendum)
 History and Physical    Valerie Ball FMW:[id number] DOB: 1945/10/15 DOA: 03/23/2023  PCP: Randeen Laine LABOR, MD   Patient coming from: Home   Chief Complaint:  Chief Complaint  Patient presents with   Chest Pain   ED TRIAGE note:Pt reports tightness in her chest and chest pain, states it feels like something is sitting on my chest onset months ago. She a stent placed about 3 weeks ago and then her CP returned 3 days later. She does report pain with breathing.   HPI:  Valerie Ball is a 78 y.o. female with medical history significant history of multivessel CAD with recent heart cath 12/19 with drug-eluting stent placement currently on aspirin  and Plavix , mitral stenosis, aortic insufficiency, essential hypertension, hyperlipidemia, hypothyroidm, irritable bowel syndrome, hiatal hernia status post unsuccessful repair in the, chronic thrombocytopenia, gastroparesis, NASH, chronic dizziness, chronic microvascular ischemic change of the CT head, chronic hemorrhoid, chronic palpitation and hyperlipidemia presented to emergency department been referred by PCP clinic as patient is complaining about chest pain that is started months ago.  In the PCP clinic patient also found hypotensive.  Patient reported she is using sublingual nitroglycerin  which does some relief of the chest pain.  Reported also associated shortness of breath and nausea.  During my evaluation patient reported that she has been having this ongoing chest pain for last few months and chest pain exacerbated with activity and she also noticed associated shortness of breath.  Since patient has been heart cath chest pain has been improved but reported that he still experiencing chest pain with exertion and sometimes at rest as well.  Chest pain located on the left side of the chest that radiates to the left-sided shoulder and back with associated shortness of breath.  Patient also reported associated nausea.  Denies any diaphoresis and  palpitation.  Patient reported chest pain limiting her physical activities at home.   ED Course:  At presentation to ED patient is hemodynamically stable except blood pressure is borderline soft. Troponin x 2 unremarkable. EKG showed normal sinus rhythm, heart rate 91, left axis deviation.  BMP showed elevated creatinine 1.37 and elevated BUN 24 otherwise unremarkable. CBC showing platelet count 166 which has been improved as compared to her baseline.  Even though normal troponin and EKG finding patient continues to have ongoing chest pain and she is very concerned. ED provider has been spoken with on-call cardiology who recommended to start Imdur  and follow-up with cardiology outpatient. ED provider reported that if anything has been changed from cardiology standpoint they will update us . Hospitalist has been contacted for further management of ongoing chest pain and it is reasonable for admit patient for observation in the setting of ongoing chest pain in the context of recent heart cath 2 weeks ago.   Significant labs in the ED: Lab Orders         Basic metabolic panel         CBC         Comprehensive metabolic panel         CBC         Hemoglobin A1c         Creatinine, urine, random         Sodium, urine, random         Urinalysis, Routine w reflex microscopic -Urine, Clean Catch       Review of Systems:  Review of Systems  Constitutional:  Negative for chills, fever and malaise/fatigue.  Respiratory:  Positive  for shortness of breath. Negative for cough and wheezing.   Cardiovascular:  Positive for chest pain. Negative for orthopnea and leg swelling.  Gastrointestinal:  Positive for nausea. Negative for heartburn and vomiting.  Musculoskeletal:  Negative for back pain.  Neurological:  Negative for dizziness and headaches.  Psychiatric/Behavioral:  The patient is not nervous/anxious.   All other systems reviewed and are negative.   Past Medical History:  Diagnosis  Date   Allergic rhinitis 08/22/2006   Qualifier: Diagnosis of   By: Cecilie CMA, NANNIE Ivy      IMO SNOMED Dx Update Oct 2024     Allergy    allegic rhinitis   Alternating constipation and diarrhea 08/12/2015   Anemia    Angiodysplasia of colon    Anxiety    Arthritis    AVM (arteriovenous malformation) of colon    Bilateral knee pain 05/11/2022   Blurred vision, right eye 08/11/2021   Patient does have blurred vision right eye,     Borderline diabetic    per patient medical history form   Branch retinal vein occlusion with macular edema of left eye 07/14/2019   CME superior to the fovea OS has worsened at 7-week interval from macular branch retinal vein occlusion, repeat Avastin  today and evaluate exam in 6 weeks     Breast cancer (HCC) 02/2009   breast CA L invasive ductal CA(hormone receptor positive.)   CAD (coronary artery disease)  CT Cardiac 01/19/2023 calcium  score 411, plaque volume 366, moderate stenosis LAD, mild stenosis D1, RCA, LCx/OM 3 02/01/2023   Chest pain 12/29/2022   Colon cancer screening 01/23/2014   Colon polyp    hyperplastic   Colon stricture (HCC) 01/14/2003   Cystoid macular edema of left eye 07/14/2019   Decreased calculated GFR 05/11/2022   Diabetic gastroparesis (HCC) 03/27/2016   Typical symptoms -suspect      No detectable diabetic retinopathy 12-27-2020     Diabetic neuropathy (HCC) 11/25/2013   Diaphragmatic hernia 03/02/2008   Qualifier: Diagnosis of   By: Genie CMA LEODIS), Leisha      IMO SNOMED Dx Update Oct 2024     Diverticulitis of colon 10/14/2014   Diverticulosis    Dizziness 01/07/2014   Dyspnea on exertion 01/04/2023   Encounter for Medicare annual wellness exam 01/23/2014   ESOPHAGEAL STRICTURE 03/02/2008   Qualifier: Diagnosis of   By: Genie CMA (AAMA), Chick         Essential hypertension 08/22/2006   Qualifier: Diagnosis of   By: Cecilie CMA, NANNIE Ivy         Estrogen deficiency 01/23/2014   Fatigue 05/20/2018    Fatty liver    Fever, low grade 09/24/2020   GAD (generalized anxiety disorder) 04/15/2007   Qualifier: Diagnosis of   By: Baird FNP, Frieda Kipper        GI bleed    Gout    Hepatic steatosis    Hiatal hernia    Hip pain, bilateral 08/31/2017   History of breast cancer 08/02/2009   Qualifier: Diagnosis of   By: Randeen MD, Laine Caldron        History of COVID-19 09/24/2020   History of removal of implants of both breasts 07/01/2013   06/25/13 elected to have bilateral breast implants removed.     History of seizure 08/22/2006   Qualifier: Diagnosis of   By: Cecilie CMA, NANNIE Ivy         Hyperlipidemia    Hyperlipidemia associated with type 2 diabetes  mellitus (HCC) 08/22/2006   Qualifier: Diagnosis of   By: Cecilie CMA, NANNIE Ivy         Hypertension    Hypertensive retinopathy of left eye 07/14/2019   Hypothyroidism    IBS (irritable bowel syndrome)    Lap Nissen October 2013 01/19/2012   Laparoscopic takedown of large type III mixed hiatus hernia with primary closure of the diaphragm with 3 sutures posteriorally (post 2 with pledgets) and Nissen fundoplication over a #56 lighted bougie        Large type III mixed hiatus hernia  12/06/2011   Plan surgery to repair diaphragm and do Nissen fundoplication     Macular edema    left eye   Melena 01/2008   anemia transfusion   Menopause    per patient medical history form   Mitral stenosis and aortic incompetence, by TTE 01/25/2023 02/01/2023   Palpitations 01/04/2023   Peritoneal adhesions 03/02/2008   Qualifier: Diagnosis of   By: Genie CMA LEODIS), Leisha      IMO SNOMED Dx Update Oct 2024     Poor balance 01/30/2023   Poorly controlled type 2 diabetes mellitus with neuropathy (HCC) 08/19/2007   Qualifier: Diagnosis of   By: Randeen MD, Laine Caldron        Posterior vitreous detachment of right eye 11/06/2019   Postmenopausal hormone therapy    per patient medical history form.   Retinal vein occlusion, branch 01/03/2016    Left with hemorrhage and edema (Dr Elner) 2017     Routine general medical examination at a health care facility 07/11/2011   Salmonella 05/2000   renal effects secondary to dehydration   Seizure disorder (HCC)    Seizures (HCC)    one seizure several yrs ago --etiology unknown-no problem since   Stress reaction 10/25/2011   SYNDROME, CARPAL TUNNEL 08/23/2006   Qualifier: Diagnosis of   By: Randeen MD, Laine Caldron        SYNDROME, RESTLESS LEGS 08/23/2006   Qualifier: Diagnosis of   By: Randeen MD, Laine Caldron        Thrombocytopenia (HCC) 11/25/2013   TRANSAMINASES, SERUM, ELEVATED 11/11/2009   Qualifier: Diagnosis of   By: Randeen MD, Laine Caldron        Tremor 01/20/2014   Head and right hand      Vitamin B12 deficiency 09/21/2016   Vitreous floaters of left eye 07/14/2019   The nature of vitreous floaters was discussed with the patient as well as their frequent occurrence with development of posterior vitreous detachment. The significance of flashes and field cuts was discussed with the patient. The need for a dilated fundus examination was discussed and advice given to return immediately for new or different floaters .  More uncommonly, vitreous floaters, debris, or    Past Surgical History:  Procedure Laterality Date   ABDOMINAL HYSTERECTOMY  1983   BREAST SURGERY  02/2009   breast biopsy invasive ductal CA-bilateral mastectomies   CORONARY STENT INTERVENTION N/A 03/08/2023   Procedure: CORONARY STENT INTERVENTION;  Surgeon: Verlin Lonni BIRCH, MD;  Location: [facility];  Service: Cardiovascular;  Laterality: N/A;   EPIGASTRIC HERNIA REPAIR  01/02/2012   Procedure: HERNIA REPAIR EPIGASTRIC ADULT;  Surgeon: Donnice KATHEE Lunger, MD;  Location: [facility];  Service: General;  Laterality: N/A;  Laparoscopic Repair Paraesophageal Hernia, Nissen   EYE SURGERY Left 07/2017   Dr. Elner GULA CYST EXCISION     LAPAROSCOPIC NISSEN FUNDOPLICATION  01/02/2012   Procedure:  LAPAROSCOPIC  NISSEN FUNDOPLICATION;  Surgeon: Donnice KATHEE Lunger, MD;  Location: [facility];  Service: General;  Laterality: N/A;   LEFT HEART CATH AND CORONARY ANGIOGRAPHY N/A 03/08/2023   Procedure: LEFT HEART CATH AND CORONARY ANGIOGRAPHY;  Surgeon: Verlin Lonni BIRCH, MD;  Location: [facility];  Service: Cardiovascular;  Laterality: N/A;   MASTECTOMY  04/2009   Bilateral for ductal carcinoma on L   OVARY SURGERY  1986   ovarian tumor removed   TUBAL LIGATION       reports that she has never smoked. She has never used smokeless tobacco. She reports current alcohol use. She reports that she does not use drugs.  Allergies  Allergen Reactions   Arimidex [Anastrozole] Other (See Comments)    Joint pain   Glipizide  Other (See Comments)    Hypoglycemia    Metformin  And Related Other (See Comments)    diarrhea   Penicillins Hives and Rash    Family History  Problem Relation Age of Onset   Hypertension Mother    Stroke Mother    Breast cancer Mother    Heart attack Father    Diabetes Father    Esophageal cancer Father    Breast cancer Sister        x 2 sisters   Diabetes Sister    Hypertension Sister    Breast cancer Sister    Hypertension Brother    Diabetes Brother     Prior to Admission medications   Medication Sig Start Date End Date Taking? Authorizing Provider  amLODipine  (NORVASC ) 2.5 MG tablet Take 1 tablet (2.5 mg total) by mouth daily. 01/04/23   Madireddy, Alean SAUNDERS, MD  aspirin  EC 81 MG tablet Take 1 tablet (81 mg total) by mouth daily. Swallow whole. 02/05/23   Madireddy, Alean SAUNDERS, MD  Calcium  Carbonate (CALCIUM  500 PO) Take 500 mg by mouth daily.    [provider]  Cholecalciferol (VITAMIN D3) 1000 units CAPS Take 1,000 Units by mouth daily.    [provider]  clopidogrel  (PLAVIX ) 75 MG tablet Take 1 tablet (75 mg total) by mouth daily. 03/08/23   Trudy Birmingham, PA-C  Cyanocobalamin  (B-12) 1000 MCG TABS Take 1,000 mcg by mouth daily.     [provider]  Dulaglutide  (TRULICITY ) 3 MG/0.5ML SOPN INJECT 0.5 ML (3 MG) UNDER THE SKIN ONCE A WEEK 12/01/22   Trixie File, MD  empagliflozin  (JARDIANCE ) 25 MG TABS tablet Take 1 tablet (25 mg total) by mouth daily before breakfast. 11/02/22   Trixie File, MD  ezetimibe  (ZETIA ) 10 MG tablet TAKE 1 TABLET DAILY 06/30/22   Tower, Laine LABOR, MD  famotidine  (PEPCID ) 20 MG tablet Take 1 tablet (20 mg total) by mouth 2 (two) times daily. 01/24/23   Tower, Laine LABOR, MD  FLUoxetine  (PROZAC ) 40 MG capsule Take 1 capsule (40 mg total) by mouth daily. 03/06/23   Tower, Laine LABOR, MD  fluticasone  (FLONASE ) 50 MCG/ACT nasal spray USE 2 SPRAYS IN EACH NOSTRIL DAILY AS NEEDED FOR ALLERGIES 03/10/21   Tower, Laine LABOR, MD  hydrochlorothiazide  (HYDRODIURIL ) 25 MG tablet TAKE 1 TABLET DAILY 09/25/22   Tower, Laine LABOR, MD  levothyroxine  (SYNTHROID ) 50 MCG tablet TAKE 1 TABLET DAILY BEFORE BREAKFAST 09/19/22   Tower, Laine LABOR, MD  losartan  (COZAAR ) 100 MG tablet TAKE 1 TABLET DAILY 06/19/22   Tower, Laine LABOR, MD  Multiple Vitamin (MULTIVITAMIN WITH MINERALS) TABS tablet Take 1 tablet by mouth daily.    [provider]  nitroGLYCERIN  (NITROSTAT )  0.4 MG SL tablet Place 1 tablet (0.4 mg total) under the tongue every 5 (five) minutes as needed for chest pain. 03/08/23   Williams, Evan, PA-C  pantoprazole  (PROTONIX ) 20 MG tablet Take 1 tablet (20 mg total) by mouth daily. 03/08/23   Williams, Evan, PA-C  repaglinide  (PRANDIN ) 1 MG tablet TAKE 1 TABLET DAILY BEFORE SUPPER 10/20/22   Trixie File, MD  rOPINIRole  (REQUIP ) 1 MG tablet TAKE 1 TABLET AT BEDTIME 06/30/22   Tower, Laine LABOR, MD  rosuvastatin  (CRESTOR ) 20 MG tablet Take 1 tablet (20 mg total) by mouth daily. 03/08/23 03/07/24  Trudy Birmingham, PA-C     Physical Exam: Vitals:   03/23/23 1745 03/23/23 2015 03/23/23 2020 03/23/23 2100  BP: 101/64 129/62  108/66  Pulse: 81 73  70  Resp: 13 16  12   Temp:   98.1 F (36.7 C)   TempSrc:   Oral    SpO2: 92% 96%  96%  Weight:      Height:        Physical Exam Constitutional:      General: She is not in acute distress.    Appearance: She is not ill-appearing.  Cardiovascular:     Rate and Rhythm: Normal rate and regular rhythm.     Heart sounds: Normal heart sounds. No murmur heard. Pulmonary:     Effort: Pulmonary effort is normal.     Breath sounds: Normal breath sounds.  Chest:     Chest wall: No tenderness.  Abdominal:     General: Bowel sounds are normal.  Musculoskeletal:     Cervical back: Neck supple.     Right lower leg: No edema.     Left lower leg: No edema.  Skin:    Capillary Refill: Capillary refill takes less than 2 seconds.  Neurological:     Mental Status: She is alert and oriented to person, place, and time.  Psychiatric:        Mood and Affect: Mood normal. Mood is not anxious.      Labs on Admission: I have personally reviewed following labs and imaging studies  CBC: Recent Labs  Lab 03/23/23 1718  WBC 8.2  HGB 14.6  HCT 44.7  MCV 85.3  PLT 166   Basic Metabolic Panel: Recent Labs  Lab 03/23/23 1501 03/23/23 1718  NA  --  140  K  --  3.7  CL  --  103  CO2  --  25  GLUCOSE 233* 141*  BUN  --  24*  CREATININE  --  1.37*  CALCIUM   --  9.7   GFR: Estimated Creatinine Clearance: 31.4 mL/min (A) (by C-G formula based on SCr of 1.37 mg/dL (H)). Liver Function Tests: No results for input(s): AST, ALT, ALKPHOS, BILITOT, PROT, ALBUMIN in the last 168 hours. No results for input(s): LIPASE, AMYLASE in the last 168 hours. No results for input(s): AMMONIA in the last 168 hours. Coagulation Profile: No results for input(s): INR, PROTIME in the last 168 hours. Cardiac Enzymes: Recent Labs  Lab 03/23/23 1718 03/23/23 1913  TROPONINIHS 5 5   BNP (last 3 results) No results for input(s): BNP in the last 8760 hours. HbA1C: No results for input(s): HGBA1C in the last 72 hours. CBG: No results for input(s):  GLUCAP in the last 168 hours. Lipid Profile: No results for input(s): CHOL, HDL, LDLCALC, TRIG, CHOLHDL, LDLDIRECT in the last 72 hours. Thyroid  Function Tests: No results for input(s): TSH, T4TOTAL, FREET4, T3FREE, THYROIDAB in the last  72 hours. Anemia Panel: No results for input(s): VITAMINB12, FOLATE, FERRITIN, TIBC, IRON, RETICCTPCT in the last 72 hours. Urine analysis:    Component Value Date/Time   COLORURINE ORANGE (A) 02/05/2014 0310   APPEARANCEUR CLOUDY (A) 02/05/2014 0310   LABSPEC 1.045 (H) 02/05/2014 0310   PHURINE 5.0 02/05/2014 0310   GLUCOSEU NEGATIVE 02/05/2014 0310   HGBUR NEGATIVE 02/05/2014 0310   BILIRUBINUR Negative 09/24/2020 1635   KETONESUR 15 (A) 02/05/2014 0310   PROTEINUR Positive (A) 09/24/2020 1635   PROTEINUR 30 (A) 02/05/2014 0310   UROBILINOGEN 0.2 09/24/2020 1635   UROBILINOGEN 0.2 02/05/2014 0310   NITRITE Negative 09/24/2020 1635   NITRITE NEGATIVE 02/05/2014 0310   LEUKOCYTESUR Negative 09/24/2020 1635    Radiological Exams on Admission: I have personally reviewed images DG Chest Portable 1 View Result Date: 03/23/2023 CLINICAL DATA:  Chest pain, shortness of breath EXAM: PORTABLE CHEST 1 VIEW COMPARISON:  12/11/2022 FINDINGS: Heart and mediastinal contours are within normal limits. No focal opacities or effusions. No acute bony abnormality. Aortic atherosclerosis. IMPRESSION: No active disease. Electronically Signed   By: Franky Crease M.D.   On: 03/23/2023 20:17     EKG: My personal interpretation of EKG shows: Normal sinus rhythm heart rate 91 and left axis deviation.    Assessment/Plan: Principal Problem:   Unstable angina (HCC) Active Problems:   History of CAD (coronary artery disease)   Acute kidney injury superimposed on chronic kidney disease (HCC)   Essential hypertension   Non-insulin  treated type 2 diabetes mellitus (HCC)   Hypothyroidism   IBS (irritable bowel syndrome)   Hiatal  hernia   Hyperlipidemia   Chronic idiopathic thrombocytopenia (HCC)    Assessment and Plan: Unstable angina History of CAD status post DES stent placement 12/19 on dual antiplatelet therapy -Patient recently underwent heart cath 12/29 which showed   Ost RCA to Prox RCA lesion is 50% stenosed.   2nd Mrg lesion is 20% stenosed.   Mid Cx lesion is 20% stenosed.   Mid LAD-1 lesion is 30% stenosed.   Mid LAD-2 lesion is 50% stenosed.   Mid LAD-3 lesion is 99% stenosed.   A drug-eluting stent was successfully placed using a SYNERGY XD [id number].   Post intervention, there is a 0% residual stenosis.  Severe mid LAD stenosis Successful PTCA/DES x 1 mid LAD Moderate stenosis ostial RCA. This does not appear to be flow limiting.   Recommendations: Continue DAPT with ASA and Plavix  for at least six months. Same day post PCI discharge.    > Patient presenting to ED for complaining of ongoing chest pain. - Troponin x 2 unremarkable. - EKG showed normal sinus rhythm heart rate 91 and left axis deviation.  There is no ST and T wave change. -ED physician has been consulted cardiology recommended to start Imdur  and outpatient follow-up given patient has flat troponin and without any EKG change. -Admitting patient for observation for ongoing chest pain/unstable angina. -Initiating Imdur  30 mg daily. - Continue aspirin  81 mg daily and Plavix  75 mg daily.  Continue ezetimibe  and Crestor . - Continue to trend troponin. -Obtaining limited echocardiogram to assess for any wall motion abnormality.  If echocardiogram shows any wall motion abnormality or troponin continues to trending up need to reach out to on-call cardiology immediately. -Repeat EKG in the a.m. - Continue cardiac monitoring for any arrhythmia. -On discharge patient need to follow-up with cardiac rehabilitation clinic.   Acute kidney injury on CKD stage II -Patient's blood pressure is borderline soft.  In the PCP clinic patient blood  pressure found also soft.  Holding amlodipine  and losartan  in setting of hypotension and AKI - Creatinine 1.35 baseline is around 1 and GFR above 60 - Prerenal acute kidney injury in the setting of hypotension. -Checking UA, urine sodium and creatinine - Starting NS 75 cc/h, continue to monitor renal function, urine output and avoid nephrotoxic agents.  Essential hypertension Transient hypotension - Holding blood pressure regimen.  Will resume as appropriate  Generalized anxiety disorder - Continue Prozac  40 mg daily  Restless leg syndrome -Continue ropinirole   Generalized anxiety disorder -Renew Prozac   Non-insulin -dependent DM type II -At home patient is on Trulicity , Jardiance  and repaglinide .  Holding Jardiance  in the setting of AKI.  Holding Trulicity  and repaglinide  in the setting of acute illness and currently hospitalized. - Continue low sliding scale SSI and at bedtime insulin  as needed coverage.  History of hiatal hernia History of gastroparesis -Continue to monitor  GERD -Continue Protonix   Hyperlipidemia -Continue Crestor  and ezetimibe   Hypothyroidism - Continue levothyroxine  50 mcg daily  Chronic thrombocytopenia - Stable platelet count.  Continue to monitor as currently on q. heparin  for DVT prophylaxis.   DVT prophylaxis:  SQ Heparin  Code Status:  Full Code Diet: Heart healthy carb modified diet Family Communication:   Family was present at bedside, at the time of interview. Opportunity was given to ask question and all questions were answered satisfactorily.  Disposition Plan: Continue to trend troponin and obtain echocardiogram.  If troponin echocardiogram within normal range tentative discharge to home next 1 to 2 days. Consults: Cardiology Admission status:   Observation, Telemetry bed  Severity of Illness: The appropriate patient status for this patient is OBSERVATION. Observation status is judged to be reasonable and necessary in order to provide  the required intensity of service to ensure the patient's safety. The patient's presenting symptoms, physical exam findings, and initial radiographic and laboratory data in the context of their medical condition is felt to place them at decreased risk for further clinical deterioration. Furthermore, it is anticipated that the patient will be medically stable for discharge from the hospital within 2 midnights of admission.     Celena Lanius, MD [facility]  How to contact the [facility] Attending or Consulting provider 7A - 7P or covering provider during after hours 7P -7A, for this patient.  Check the care team in [facility] and look for a) attending/consulting [facility] provider listed and b) the [facility] team listed Log into [email or web address] and use [place]'s universal password to access. If you do not have the password, please contact the hospital operator. Locate the [facility] provider you are looking for under [facility] and page to a number that you can be directly reached. If you still have difficulty reaching the provider, please page the [facility] (Director on Call) for the Hospitalists listed on amion for assistance.  03/23/2023, 10:11 PM

## 2023-03-23 NOTE — Assessment & Plan Note (Signed)
 Pt had cath and stent on 12/19 Today feels poorly complaining of cp and pressure (since 3 d post cath)  EKG-no acute changes Glucose ove 200 Wants to go to ER for eval Is stable and able to ambulate (however blood pressure is low)  and request her husband drive her instead of calling 911 Heading to [place]

## 2023-03-23 NOTE — Assessment & Plan Note (Addendum)
 Left Occurred this am Sharp in nature  Retinal vein occlusion on this side in past and baseline vision deficit  No facial droop or speech change here today  Also has cp and is headed to [place] now

## 2023-03-23 NOTE — Telephone Encounter (Signed)
 Pt calling to make provider aware that she is currently on her way to the ED. Please advise

## 2023-03-23 NOTE — Telephone Encounter (Signed)
 Pt at [facility]

## 2023-03-23 NOTE — Assessment & Plan Note (Signed)
 Blood pressure low today  BP: (!) 96/50  Going to ER for cp  Currently  Amlodipine 2.5 mg daily (anti anginal) Hydrochlorothiazide 25 mg daily Losartan 100 mg

## 2023-03-23 NOTE — Consult Note (Signed)
 Cardiology Consultation:   Patient ID: Valerie Ball MRN: [id number]; DOB: 06/19/1945  Admit date: 03/23/2023 Date of Consult: 03/23/2023  Primary Care Provider: Randeen Laine LABOR, MD Primary Cardiologist: Alean SAUNDERS Madireddy, MD  Primary Electrophysiologist:  None    Patient Profile:   Valerie Ball is a 78 y.o. female with a hx of  hypertension, hyperlipidemia, hypothyroidism, seizure disorder, chronic thrombocytopenia, gastroparesis, NASH, chronic dizziness, chronic microvascular ischemic changes on CT head September 2024, hemorrhoids with CAD s/p PCI to mid LAD who is being seen today for the evaluation of chest pain at the request of ED.    History of Present Illness:   Valerie Ball states since she had her PCI [name] a day after has been having episodes of chest discomfort.  They are intermittent, on and off and is similar to her symptoms prior to her stent placement.  Has all noticed shortness of breath and limited functional capacity.  Chest pain symptoms persisted however bothersome and she discussed it with her primary care who recommended further evaluation in the hospital.  Symptom had fully resolved 1 patient was evaluated and was in a good spirit.  Past Medical History:  Diagnosis Date   Allergic rhinitis 08/22/2006   Qualifier: Diagnosis of   By: Cecilie CMA, NANNIE Ivy      IMO SNOMED Dx Update Oct 2024     Allergy    allegic rhinitis   Alternating constipation and diarrhea 08/12/2015   Anemia    Angiodysplasia of colon    Anxiety    Arthritis    AVM (arteriovenous malformation) of colon    Bilateral knee pain 05/11/2022   Blurred vision, right eye 08/11/2021   Patient does have blurred vision right eye,     Borderline diabetic    per patient medical history form   Branch retinal vein occlusion with macular edema of left eye 07/14/2019   CME superior to the fovea OS has worsened at 7-week interval from macular branch retinal vein occlusion, repeat Avastin   today and evaluate exam in 6 weeks     Breast cancer (HCC) 02/2009   breast CA L invasive ductal CA(hormone receptor positive.)   CAD (coronary artery disease)  CT Cardiac 01/19/2023 calcium  score 411, plaque volume 366, moderate stenosis LAD, mild stenosis D1, RCA, LCx/OM 3 02/01/2023   Chest pain 12/29/2022   Colon cancer screening 01/23/2014   Colon polyp    hyperplastic   Colon stricture (HCC) 01/14/2003   Cystoid macular edema of left eye 07/14/2019   Decreased calculated GFR 05/11/2022   Diabetic gastroparesis (HCC) 03/27/2016   Typical symptoms -suspect      No detectable diabetic retinopathy 12-27-2020     Diabetic neuropathy (HCC) 11/25/2013   Diaphragmatic hernia 03/02/2008   Qualifier: Diagnosis of   By: Genie CMA LEODIS), Leisha      IMO SNOMED Dx Update Oct 2024     Diverticulitis of colon 10/14/2014   Diverticulosis    Dizziness 01/07/2014   Dyspnea on exertion 01/04/2023   Encounter for Medicare annual wellness exam 01/23/2014   ESOPHAGEAL STRICTURE 03/02/2008   Qualifier: Diagnosis of   By: Kowalk CMA (AAMA), Chick         Essential hypertension 08/22/2006   Qualifier: Diagnosis of   By: Cecilie CMA, NANNIE Ivy         Estrogen deficiency 01/23/2014   Fatigue 05/20/2018   Fatty liver    Fever, low grade 09/24/2020   GAD (generalized anxiety disorder) 04/15/2007  Qualifier: Diagnosis of   By: Baird FNP, Frieda Kipper        GI bleed    Gout    Hepatic steatosis    Hiatal hernia    Hip pain, bilateral 08/31/2017   History of breast cancer 08/02/2009   Qualifier: Diagnosis of   By: Randeen MD, Laine Caldron        History of COVID-19 09/24/2020   History of removal of implants of both breasts 07/01/2013   06/25/13 elected to have bilateral breast implants removed.     History of seizure 08/22/2006   Qualifier: Diagnosis of   By: Cecilie CMA, NANNIE Ivy         Hyperlipidemia    Hyperlipidemia associated with type 2 diabetes mellitus (HCC) 08/22/2006    Qualifier: Diagnosis of   By: Cecilie CMA, NANNIE Ivy         Hypertension    Hypertensive retinopathy of left eye 07/14/2019   Hypothyroidism    IBS (irritable bowel syndrome)    Lap Nissen October 2013 01/19/2012   Laparoscopic takedown of large type III mixed hiatus hernia with primary closure of the diaphragm with 3 sutures posteriorally (post 2 with pledgets) and Nissen fundoplication over a #56 lighted bougie        Large type III mixed hiatus hernia  12/06/2011   Plan surgery to repair diaphragm and do Nissen fundoplication     Macular edema    left eye   Melena 01/2008   anemia transfusion   Menopause    per patient medical history form   Mitral stenosis and aortic incompetence, by TTE 01/25/2023 02/01/2023   Palpitations 01/04/2023   Peritoneal adhesions 03/02/2008   Qualifier: Diagnosis of   By: Genie CMA LEODIS), Leisha      IMO SNOMED Dx Update Oct 2024     Poor balance 01/30/2023   Poorly controlled type 2 diabetes mellitus with neuropathy (HCC) 08/19/2007   Qualifier: Diagnosis of   By: Randeen MD, Laine Caldron        Posterior vitreous detachment of right eye 11/06/2019   Postmenopausal hormone therapy    per patient medical history form.   Retinal vein occlusion, branch 01/03/2016   Left with hemorrhage and edema (Dr Elner) 2017     Routine general medical examination at a health care facility 07/11/2011   Salmonella 05/2000   renal effects secondary to dehydration   Seizure disorder (HCC)    Seizures (HCC)    one seizure several yrs ago --etiology unknown-no problem since   Stress reaction 10/25/2011   SYNDROME, CARPAL TUNNEL 08/23/2006   Qualifier: Diagnosis of   By: Randeen MD, Laine Caldron        SYNDROME, RESTLESS LEGS 08/23/2006   Qualifier: Diagnosis of   By: Randeen MD, Laine Caldron        Thrombocytopenia (HCC) 11/25/2013   TRANSAMINASES, SERUM, ELEVATED 11/11/2009   Qualifier: Diagnosis of   By: Randeen MD, Laine Caldron        Tremor 01/20/2014   Head and right hand       Vitamin B12 deficiency 09/21/2016   Vitreous floaters of left eye 07/14/2019   The nature of vitreous floaters was discussed with the patient as well as their frequent occurrence with development of posterior vitreous detachment. The significance of flashes and field cuts was discussed with the patient. The need for a dilated fundus examination was discussed and advice given to return immediately for new or different floaters .  More uncommonly, vitreous floaters, debris, or    Past Surgical History:  Procedure Laterality Date   ABDOMINAL HYSTERECTOMY  1983   BREAST SURGERY  02/2009   breast biopsy invasive ductal CA-bilateral mastectomies   CORONARY STENT INTERVENTION N/A 03/08/2023   Procedure: CORONARY STENT INTERVENTION;  Surgeon: Verlin Lonni BIRCH, MD;  Location: [facility];  Service: Cardiovascular;  Laterality: N/A;   EPIGASTRIC HERNIA REPAIR  01/02/2012   Procedure: HERNIA REPAIR EPIGASTRIC ADULT;  Surgeon: Donnice KATHEE Lunger, MD;  Location: [facility];  Service: General;  Laterality: N/A;  Laparoscopic Repair Paraesophageal Hernia, Nissen   EYE SURGERY Left 07/2017   Dr. Elner GULA CYST EXCISION     LAPAROSCOPIC NISSEN FUNDOPLICATION  01/02/2012   Procedure: LAPAROSCOPIC NISSEN FUNDOPLICATION;  Surgeon: Donnice KATHEE Lunger, MD;  Location: [facility];  Service: General;  Laterality: N/A;   LEFT HEART CATH AND CORONARY ANGIOGRAPHY N/A 03/08/2023   Procedure: LEFT HEART CATH AND CORONARY ANGIOGRAPHY;  Surgeon: Verlin Lonni BIRCH, MD;  Location: [facility];  Service: Cardiovascular;  Laterality: N/A;   MASTECTOMY  04/2009   Bilateral for ductal carcinoma on L   OVARY SURGERY  1986   ovarian tumor removed   TUBAL LIGATION       Home Medications:  Prior to Admission medications   Medication Sig Start Date End Date Taking? Authorizing Provider  amLODipine  (NORVASC ) 2.5 MG tablet Take 1 tablet (2.5 mg total) by mouth daily. 01/04/23  Yes Madireddy, Alean SAUNDERS, MD  aspirin  EC 81 MG tablet Take 1 tablet (81 mg total) by mouth daily. Swallow whole. 02/05/23  Yes Madireddy, Alean SAUNDERS, MD  Calcium  Carbonate (CALCIUM  500 PO) Take 500 mg by mouth daily.   Yes [provider]  Cholecalciferol (VITAMIN D3) 1000 units CAPS Take 1,000 Units by mouth daily.   Yes [provider]  clopidogrel  (PLAVIX ) 75 MG tablet Take 1 tablet (75 mg total) by mouth daily. 03/08/23  Yes Trudy Birmingham, PA-C  Cyanocobalamin  (B-12) 1000 MCG TABS Take 1,000 mcg by mouth daily.   Yes [provider]  Dulaglutide  (TRULICITY ) 3 MG/0.5ML SOPN INJECT 0.5 ML (3 MG) UNDER THE SKIN ONCE A WEEK Patient taking differently: Inject 0.5 mLs into the skin once a week. On Saturdays 12/01/22  Yes Trixie File, MD  empagliflozin  (JARDIANCE ) 25 MG TABS tablet Take 1 tablet (25 mg total) by mouth daily before breakfast. 11/02/22  Yes Trixie File, MD  ezetimibe  (ZETIA ) 10 MG tablet TAKE 1 TABLET DAILY 06/30/22  Yes Tower, Laine LABOR, MD  famotidine  (PEPCID ) 20 MG tablet Take 1 tablet (20 mg total) by mouth 2 (two) times daily. 01/24/23  Yes Tower, Laine LABOR, MD  FLUoxetine  (PROZAC ) 40 MG capsule Take 1 capsule (40 mg total) by mouth daily. 03/06/23  Yes Tower, Laine LABOR, MD  fluticasone  (FLONASE ) 50 MCG/ACT nasal spray USE 2 SPRAYS IN EACH NOSTRIL DAILY AS NEEDED FOR ALLERGIES 03/10/21  Yes Tower, Laine LABOR, MD  hydrochlorothiazide  (HYDRODIURIL ) 25 MG tablet TAKE 1 TABLET DAILY 09/25/22  Yes Tower, Laine LABOR, MD  levothyroxine  (SYNTHROID ) 50 MCG tablet TAKE 1 TABLET DAILY BEFORE BREAKFAST 09/19/22  Yes Tower, Laine LABOR, MD  losartan  (COZAAR ) 100 MG tablet TAKE 1 TABLET DAILY 06/19/22  Yes Tower, Laine LABOR, MD  Multiple Vitamin (MULTIVITAMIN WITH MINERALS) TABS tablet Take 1 tablet by mouth daily.   Yes [provider]  nitroGLYCERIN  (NITROSTAT ) 0.4 MG SL tablet Place 1 tablet (0.4 mg total) under the tongue  every 5 (five) minutes as needed for chest pain. 03/08/23  Yes Trudy Birmingham, PA-C  pantoprazole  (PROTONIX ) 20 MG tablet Take 1 tablet (20 mg total) by mouth daily. 03/08/23  Yes Williams, Evan, PA-C  repaglinide  (PRANDIN ) 1 MG tablet TAKE 1 TABLET DAILY BEFORE SUPPER 10/20/22  Yes Trixie File, MD  rOPINIRole  (REQUIP ) 1 MG tablet TAKE 1 TABLET AT BEDTIME 06/30/22  Yes Tower, Laine LABOR, MD  rosuvastatin  (CRESTOR ) 20 MG tablet Take 1 tablet (20 mg total) by mouth daily. 03/08/23 03/07/24 Yes Trudy Birmingham, PA-C    Inpatient Medications: Scheduled Meds:  aspirin  EC  81 mg Oral Daily   clopidogrel   75 mg Oral Daily   docusate sodium   100 mg Oral BID   [START ON 03/24/2023] ezetimibe   10 mg Oral Daily   FLUoxetine   40 mg Oral Daily   heparin   5,000 Units Subcutaneous Q8H   insulin  aspart  0-5 Units Subcutaneous QHS   [START ON 03/24/2023] insulin  aspart  0-6 Units Subcutaneous TID WC   isosorbide  mononitrate  30 mg Oral Daily   [START ON 03/24/2023] levothyroxine   50 mcg Oral Q0600   pantoprazole   20 mg Oral Daily   rOPINIRole   1 mg Oral QHS   rosuvastatin   20 mg Oral Daily   sodium chloride  flush  3 mL Intravenous Q12H   Continuous Infusions:  sodium chloride      sodium chloride      PRN Meds: sodium chloride , acetaminophen  **OR** acetaminophen , ondansetron  **OR** ondansetron  (ZOFRAN ) IV, sodium chloride  flush  Allergies:    Allergies  Allergen Reactions   Arimidex [Anastrozole] Other (See Comments)    Joint pain   Glipizide  Other (See Comments)    Hypoglycemia    Metformin  And Related Other (See Comments)    diarrhea   Penicillins Hives and Rash    Social History:   Social History   Socioeconomic History   Marital status: Married    Spouse name: Not on file   Number of children: 3   Years of education: Not on file   Highest education level: Not on file  Occupational History   Occupation: retired    Associate Professor: UNEMPLOYED  Tobacco Use   Smoking status: Never   Smokeless tobacco: Never  Vaping Use   Vaping status: Never Used  Substance  and Sexual Activity   Alcohol use: Yes    Comment: occasional   Drug use: Never   Sexual activity: Not Currently  Other Topics Concern   Not on file  Social History Narrative   ** Merged History Encounter **       1 cup of coffee every morning   Social Drivers of Health   Financial Resource Strain: Low Risk  (02/28/2023)   Overall Financial Resource Strain (CARDIA)    Difficulty of Paying Living Expenses: Not hard at all  Food Insecurity: No Food Insecurity (02/28/2023)   Hunger Vital Sign    Worried About Running Out of Food in the Last Year: Never true    Ran Out of Food in the Last Year: Never true  Transportation Needs: No Transportation Needs (02/28/2023)   PRAPARE - Administrator, Civil Service (Medical): No    Lack of Transportation (Non-Medical): No  Physical Activity: Inactive (02/28/2023)   Exercise Vital Sign    Days of Exercise per Week: 0 days    Minutes of Exercise per Session: 0 min  Stress: Stress Concern Present (02/28/2023)   [employer] of Occupational Health - Occupational Stress  Questionnaire    Feeling of Stress : Rather much  Social Connections: Socially Integrated (02/28/2023)   Social Connection and Isolation Panel [NHANES]    Frequency of Communication with Friends and Family: Twice a week    Frequency of Social Gatherings with Friends and Family: Once a week    Attends Religious Services: More than 4 times per year    Active Member of [employer] or Organizations: Yes    Attends Banker Meetings: 1 to 4 times per year    Marital Status: Married  Catering Manager Violence: Not At Risk (02/28/2023)   Humiliation, Afraid, Rape, and Kick questionnaire    Fear of Current or Ex-Partner: No    Emotionally Abused: No    Physically Abused: No    Sexually Abused: No    Family History:    Family History  Problem Relation Age of Onset   Hypertension Mother    Stroke Mother    Breast cancer Mother    Heart attack Father     Diabetes Father    Esophageal cancer Father    Breast cancer Sister        x 2 sisters   Diabetes Sister    Hypertension Sister    Breast cancer Sister    Hypertension Brother    Diabetes Brother       Review of Systems: [y] = yes, [ ]  = no   General: Weight gain [ ] ; Weight loss [ ] ; Anorexia [ ] ; Fatigue [ ] ; Fever [ ] ; Chills [ ] ; Weakness [ ]   Cardiac: Chest pain/pressure [ x]; Resting SOB [ ] ; Exertional SOB [x ]; Orthopnea [ ] ; Pedal Edema [ ] ; Palpitations [ ] ; Syncope [ ] ; Presyncope [ ] ; Paroxysmal nocturnal dyspnea[ ]   Pulmonary: Cough [ ] ; Wheezing[ ] ; Hemoptysis[ ] ; Sputum [ ] ; Snoring [ ]   GI: Vomiting[ ] ; Dysphagia[ ] ; Melena[ ] ; Hematochezia [ ] ; Heartburn[ ] ; Abdominal pain [ ] ; Constipation [ ] ; Diarrhea [ ] ; BRBPR [ ]   GU: Hematuria[ ] ; Dysuria [ ] ; Nocturia[ ]   Vascular: Pain in legs with walking [ ] ; Pain in feet with lying flat [ ] ; Non-healing sores [ ] ; Stroke [ ] ; TIA [ ] ; Slurred speech [ ] ;  Neuro: Headaches[ ] ; Vertigo[ ] ; Seizures[ ] ; Paresthesias[ ] ;Blurred vision [ ] ; Diplopia [ ] ; Vision changes [ ]   Ortho/Skin: Arthritis [ ] ; Joint pain [ ] ; Muscle pain [ ] ; Joint swelling [ ] ; Back Pain [ ] ; Rash [ ]   Psych: Depression[ ] ; Anxiety[ ]   Heme: Bleeding problems [ ] ; Clotting disorders [ ] ; Anemia [ ]   Endocrine: Diabetes [ ] ; Thyroid  dysfunction[ ]   Physical Exam/Data:   Vitals:   03/23/23 1745 03/23/23 2015 03/23/23 2020 03/23/23 2100  BP: 101/64 129/62  108/66  Pulse: 81 73  70  Resp: 13 16  12   Temp:   98.1 F (36.7 C)   TempSrc:   Oral   SpO2: 92% 96%  96%  Weight:      Height:       No intake or output data in the 24 hours ending 03/23/23 2202 Filed Weights   03/23/23 1547  Weight: 64.9 kg   Body mass index is 26.16 kg/m.  General:  Well nourished, well developed, in no acute distress HEENT: normal Lymph: no adenopathy Neck: no JVD Endocrine:  No thryomegaly Vascular: No carotid bruits; FA pulses 2+ bilaterally without bruits   Cardiac:  normal S1, S2; RRR; no murmur  Lungs:  clear  to auscultation bilaterally, no wheezing, rhonchi or rales  Abd: soft, nontender, no hepatomegaly  Ext: no edema Musculoskeletal:  No deformities, BUE and BLE strength normal and equal Skin: warm and dry  Neuro:  CNs 2-12 intact, no focal abnormalities noted Psych:  Normal affect   EKG:  The EKG was personally reviewed and demonstrates:  normal sinus rhythm, no st changes Telemetry:  Telemetry was personally reviewed and demonstrates:  nsr  Relevant CV Studies: Cardiac Catheterization 2023-03-15:     Ost RCA to Prox RCA lesion is 50% stenosed.   2nd Mrg lesion is 20% stenosed.   Mid Cx lesion is 20% stenosed.   Mid LAD-1 lesion is 30% stenosed.   Mid LAD-2 lesion is 50% stenosed.   Mid LAD-3 lesion is 99% stenosed.   A drug-eluting stent was successfully placed using a SYNERGY XD 2.25X16.   Post intervention, there is a 0% residual stenosis.   Severe mid LAD stenosis Successful PTCA/DES x 1 mid LAD Moderate stenosis ostial RCA. This does not appear to be flow limiting.    Recommendations: Continue DAPT with ASA and Plavix  for at least six months. Same day post PCI discharge.   Laboratory Data:  Chemistry Recent Labs  Lab 03/23/23 1501 03/23/23 1718  NA  --  140  K  --  3.7  CL  --  103  CO2  --  25  GLUCOSE 233* 141*  BUN  --  24*  CREATININE  --  1.37*  CALCIUM   --  9.7  GFRNONAA  --  40*  ANIONGAP  --  12    No results for input(s): PROT, ALBUMIN, AST, ALT, ALKPHOS, BILITOT in the last 168 hours. Hematology Recent Labs  Lab 03/23/23 1718  WBC 8.2  RBC 5.24*  HGB 14.6  HCT 44.7  MCV 85.3  MCH 27.9  MCHC 32.7  RDW 15.2  PLT 166   Cardiac EnzymesNo results for input(s): TROPONINI in the last 168 hours. No results for input(s): TROPIPOC in the last 168 hours.  BNPNo results for input(s): BNP, PROBNP in the last 168 hours.  DDimer No results for input(s): DDIMER in the last 168  hours.  Radiology/Studies:  DG Chest Portable 1 View Result Date: 03/23/2023 CLINICAL DATA:  Chest pain, shortness of breath EXAM: PORTABLE CHEST 1 VIEW COMPARISON:  12/11/2022 FINDINGS: Heart and mediastinal contours are within normal limits. No focal opacities or effusions. No acute bony abnormality. Aortic atherosclerosis. IMPRESSION: No active disease. Electronically Signed   By: Franky Crease M.D.   On: 03/23/2023 20:17    Assessment and Plan:   Unstable angina Troponins negative thus far Dyspnea on exertion Coronary disease status post recent PCI Type 2 diabetes  Plan: -Agree with fully ruling out overnight. -Complete limited echo in the morning. -Agree with volume and would discontinue HCTZ and initiate Imdur  as planned.  Can consider adding beta-blocker and /or amlodipine . -Consider ischemic evaluation if symptoms recur. -Potential DC tomorrow.  For questions or updates, please contact [facility] Please consult [email or web address] for contact info under     Signed, Marwah Disbro A Latrece Nitta, MD  03/23/2023 10:02 PM

## 2023-03-23 NOTE — Assessment & Plan Note (Signed)
 Chest pain and pressure since 3 days after cath and LAD stent (12/19) Taking low dose amlodipine  Blood pressure is low  EKG -no acute changes   Desires ER - declines 911, pt's [facility] [place] [facility] now [facility]

## 2023-03-23 NOTE — ED Provider Notes (Signed)
 [place] EMERGENCY DEPARTMENT AT [facility] Provider Note   CSN: [id number] Arrival date & time: 03/23/23  1535     History  Chief Complaint  Patient presents with   Chest Pain    Valerie Ball is a 78 y.o. female.  Patient to ED with c/o chest pain, center of the chest radiates to left side. She reports pain everyday since her cardiac catheterization on 12/19. She states she has NTG and used in shortly after her procedure with some relief but none since. She reports associated SOB. She started having nausea yesterday that persists today.   The history is provided by the patient. No language interpreter was used.  Chest Pain      Home Medications Prior to Admission medications   Medication Sig Start Date End Date Taking? Authorizing Provider  amLODipine  (NORVASC ) 2.5 MG tablet Take 1 tablet (2.5 mg total) by mouth daily. 01/04/23  Yes Madireddy, Alean SAUNDERS, MD  aspirin  EC 81 MG tablet Take 1 tablet (81 mg total) by mouth daily. Swallow whole. 02/05/23  Yes Madireddy, Alean SAUNDERS, MD  Calcium  Carbonate (CALCIUM  500 PO) Take 500 mg by mouth daily.   Yes [provider]  Cholecalciferol (VITAMIN D3) 1000 units CAPS Take 1,000 Units by mouth daily.   Yes [provider]  clopidogrel  (PLAVIX ) 75 MG tablet Take 1 tablet (75 mg total) by mouth daily. 03/08/23  Yes Trudy Birmingham, PA-C  Cyanocobalamin  (B-12) 1000 MCG TABS Take 1,000 mcg by mouth daily.   Yes [provider]  Dulaglutide  (TRULICITY ) 3 MG/0.5ML SOPN INJECT 0.5 ML (3 MG) UNDER THE SKIN ONCE A WEEK Patient taking differently: Inject 0.5 mLs into the skin once a week. On Saturdays 12/01/22  Yes Trixie File, MD  empagliflozin  (JARDIANCE ) 25 MG TABS tablet Take 1 tablet (25 mg total) by mouth daily before breakfast. 11/02/22  Yes Trixie File, MD  ezetimibe  (ZETIA ) 10 MG tablet TAKE 1 TABLET DAILY 06/30/22  Yes Tower, Laine LABOR, MD  famotidine  (PEPCID ) 20 MG tablet Take 1 tablet  (20 mg total) by mouth 2 (two) times daily. 01/24/23  Yes Tower, Laine LABOR, MD  FLUoxetine  (PROZAC ) 40 MG capsule Take 1 capsule (40 mg total) by mouth daily. 03/06/23  Yes Tower, Laine LABOR, MD  fluticasone  (FLONASE ) 50 MCG/ACT nasal spray USE 2 SPRAYS IN EACH NOSTRIL DAILY AS NEEDED FOR ALLERGIES 03/10/21  Yes Tower, Laine LABOR, MD  hydrochlorothiazide  (HYDRODIURIL ) 25 MG tablet TAKE 1 TABLET DAILY 09/25/22  Yes Tower, Laine LABOR, MD  levothyroxine  (SYNTHROID ) 50 MCG tablet TAKE 1 TABLET DAILY BEFORE BREAKFAST 09/19/22  Yes Tower, Laine LABOR, MD  losartan  (COZAAR ) 100 MG tablet TAKE 1 TABLET DAILY 06/19/22  Yes Tower, Laine LABOR, MD  Multiple Vitamin (MULTIVITAMIN WITH MINERALS) TABS tablet Take 1 tablet by mouth daily.   Yes [provider]  nitroGLYCERIN  (NITROSTAT ) 0.4 MG SL tablet Place 1 tablet (0.4 mg total) under the tongue every 5 (five) minutes as needed for chest pain. 03/08/23  Yes Williams, Evan, PA-C  pantoprazole  (PROTONIX ) 20 MG tablet Take 1 tablet (20 mg total) by mouth daily. 03/08/23  Yes Williams, Evan, PA-C  repaglinide  (PRANDIN ) 1 MG tablet TAKE 1 TABLET DAILY BEFORE SUPPER 10/20/22  Yes Trixie File, MD  rOPINIRole  (REQUIP ) 1 MG tablet TAKE 1 TABLET AT BEDTIME 06/30/22  Yes Tower, Laine LABOR, MD  rosuvastatin  (CRESTOR ) 20 MG tablet Take 1 tablet (20 mg total) by mouth daily. 03/08/23 03/07/24 Yes Trudy Birmingham, PA-C  Allergies    Arimidex [anastrozole], Glipizide , Metformin  and related, and Penicillins    Review of Systems   Review of Systems  Cardiovascular:  Positive for chest pain.    Physical Exam Updated Vital Signs BP 116/69 (BP Location: Right Arm)   Pulse 72   Temp (!) 97.5 F (36.4 C) (Oral)   Resp 17   Ht 5' 2 (1.575 m)   Wt 64.9 kg   LMP 03/20/1981   SpO2 95%   BMI 26.16 kg/m  Physical Exam Vitals and nursing note reviewed.  Constitutional:      Appearance: She is well-developed.  HENT:     Head: Normocephalic.  Cardiovascular:     Rate and Rhythm:  Normal rate and regular rhythm.     Heart sounds: No murmur heard. Pulmonary:     Effort: Pulmonary effort is normal.     Breath sounds: Normal breath sounds. No wheezing, rhonchi or rales.  Abdominal:     General: Bowel sounds are normal.     Palpations: Abdomen is soft.     Tenderness: There is no abdominal tenderness. There is no guarding or rebound.  Musculoskeletal:        General: Normal range of motion.     Cervical back: Normal range of motion and neck supple.     Right lower leg: No edema.     Left lower leg: No edema.  Skin:    General: Skin is warm and dry.  Neurological:     General: No focal deficit present.     Mental Status: She is alert and oriented to person, place, and time.     ED Results / Procedures / Treatments   Labs (all labs ordered are listed, but only abnormal results are displayed) Labs Reviewed  BASIC METABOLIC PANEL - Abnormal; Notable for the following components:      Result Value   Glucose, Bld 141 (*)    BUN 24 (*)    Creatinine, Ser 1.37 (*)    GFR, Estimated 40 (*)    All other components within normal limits  CBC - Abnormal; Notable for the following components:   RBC 5.24 (*)    All other components within normal limits  COMPREHENSIVE METABOLIC PANEL - Abnormal; Notable for the following components:   Potassium 3.3 (*)    Glucose, Bld 129 (*)    BUN 24 (*)    Creatinine, Ser 1.43 (*)    GFR, Estimated 38 (*)    All other components within normal limits  CBC - Abnormal; Notable for the following components:   Platelets 128 (*)    All other components within normal limits  URINALYSIS, ROUTINE W REFLEX MICROSCOPIC - Abnormal; Notable for the following components:   APPearance HAZY (*)    Glucose, UA >=500 (*)    Leukocytes,Ua MODERATE (*)    All other components within normal limits  CBG MONITORING, ED - Abnormal; Notable for the following components:   Glucose-Capillary 100 (*)    All other components within normal limits  CBG  MONITORING, ED - Abnormal; Notable for the following components:   Glucose-Capillary 111 (*)    All other components within normal limits  CREATININE, URINE, RANDOM  SODIUM, URINE, RANDOM  BRAIN NATRIURETIC PEPTIDE  HEMOGLOBIN A1C  TROPONIN I (HIGH SENSITIVITY)  TROPONIN I (HIGH SENSITIVITY)  TROPONIN I (HIGH SENSITIVITY)  TROPONIN I (HIGH SENSITIVITY)  TROPONIN I (HIGH SENSITIVITY)   Results for orders placed or performed during the hospital encounter of  03/23/23  Basic metabolic panel   Collection Time: 03/23/23  5:18 PM  Result Value Ref Range   Sodium 140 135 - 145 mmol/L   Potassium 3.7 3.5 - 5.1 mmol/L   Chloride 103 98 - 111 mmol/L   CO2 25 22 - 32 mmol/L   Glucose, Bld 141 (H) 70 - 99 mg/dL   BUN 24 (H) 8 - 23 mg/dL   Creatinine, Ser 8.62 (H) 0.44 - 1.00 mg/dL   Calcium  9.7 8.9 - 10.3 mg/dL   GFR, Estimated 40 (L) >60 mL/min   Anion gap 12 5 - 15  CBC   Collection Time: 03/23/23  5:18 PM  Result Value Ref Range   WBC 8.2 4.0 - 10.5 K/uL   RBC 5.24 (H) 3.87 - 5.11 MIL/uL   Hemoglobin 14.6 12.0 - 15.0 g/dL   HCT 55.2 63.9 - 53.9 %   MCV 85.3 80.0 - 100.0 fL   MCH 27.9 26.0 - 34.0 pg   MCHC 32.7 30.0 - 36.0 g/dL   RDW 84.7 88.4 - 84.4 %   Platelets 166 150 - 400 K/uL   nRBC 0.0 0.0 - 0.2 %  Troponin I (High Sensitivity)   Collection Time: 03/23/23  5:18 PM  Result Value Ref Range   Troponin I (High Sensitivity) 5 <18 ng/L  Troponin I (High Sensitivity)   Collection Time: 03/23/23  7:13 PM  Result Value Ref Range   Troponin I (High Sensitivity) 5 <18 ng/L  CBG monitoring, ED   Collection Time: 03/23/23 10:27 PM  Result Value Ref Range   Glucose-Capillary 100 (H) 70 - 99 mg/dL  Brain natriuretic peptide   Collection Time: 03/24/23 12:34 AM  Result Value Ref Range   B Natriuretic Peptide 43.0 0.0 - 100.0 pg/mL  Troponin I (High Sensitivity)   Collection Time: 03/24/23 12:34 AM  Result Value Ref Range   Troponin I (High Sensitivity) 5 <18 ng/L   Comprehensive metabolic panel   Collection Time: 03/24/23  2:31 AM  Result Value Ref Range   Sodium 138 135 - 145 mmol/L   Potassium 3.3 (L) 3.5 - 5.1 mmol/L   Chloride 101 98 - 111 mmol/L   CO2 23 22 - 32 mmol/L   Glucose, Bld 129 (H) 70 - 99 mg/dL   BUN 24 (H) 8 - 23 mg/dL   Creatinine, Ser 8.56 (H) 0.44 - 1.00 mg/dL   Calcium  8.9 8.9 - 10.3 mg/dL   Total Protein 6.6 6.5 - 8.1 g/dL   Albumin 3.6 3.5 - 5.0 g/dL   AST 34 15 - 41 U/L   ALT 23 0 - 44 U/L   Alkaline Phosphatase 47 38 - 126 U/L   Total Bilirubin 1.2 0.0 - 1.2 mg/dL   GFR, Estimated 38 (L) >60 mL/min   Anion gap 14 5 - 15  CBC   Collection Time: 03/24/23  2:31 AM  Result Value Ref Range   WBC 7.6 4.0 - 10.5 K/uL   RBC 4.45 3.87 - 5.11 MIL/uL   Hemoglobin 12.2 12.0 - 15.0 g/dL   HCT 61.4 63.9 - 53.9 %   MCV 86.5 80.0 - 100.0 fL   MCH 27.4 26.0 - 34.0 pg   MCHC 31.7 30.0 - 36.0 g/dL   RDW 84.4 88.4 - 84.4 %   Platelets 128 (L) 150 - 400 K/uL   nRBC 0.0 0.0 - 0.2 %  Creatinine, urine, random   Collection Time: 03/24/23  2:31 AM  Result Value Ref Range  Creatinine, Urine 154 mg/dL  Sodium, urine, random   Collection Time: 03/24/23  2:31 AM  Result Value Ref Range   Sodium, Ur 47 mmol/L  Urinalysis, Routine w reflex microscopic -Urine, Clean Catch   Collection Time: 03/24/23  2:31 AM  Result Value Ref Range   Color, Urine YELLOW YELLOW   APPearance HAZY (A) CLEAR   Specific Gravity, Urine 1.016 1.005 - 1.030   pH 5.0 5.0 - 8.0   Glucose, UA >=500 (A) NEGATIVE mg/dL   Hgb urine dipstick NEGATIVE NEGATIVE   Bilirubin Urine NEGATIVE NEGATIVE   Ketones, ur NEGATIVE NEGATIVE mg/dL   Protein, ur NEGATIVE NEGATIVE mg/dL   Nitrite NEGATIVE NEGATIVE   Leukocytes,Ua MODERATE (A) NEGATIVE   RBC / HPF 0-5 0 - 5 RBC/hpf   WBC, UA 21-50 0 - 5 WBC/hpf   Bacteria, UA NONE SEEN NONE SEEN   Squamous Epithelial / HPF 0-5 0 - 5 /HPF   Mucus PRESENT   Troponin I (High Sensitivity)   Collection Time: 03/24/23  2:31 AM   Result Value Ref Range   Troponin I (High Sensitivity) 5 <18 ng/L  Troponin I (High Sensitivity)   Collection Time: 03/24/23  4:30 AM  Result Value Ref Range   Troponin I (High Sensitivity) 5 <18 ng/L  CBG monitoring, ED   Collection Time: 03/24/23  7:48 AM  Result Value Ref Range   Glucose-Capillary 111 (H) 70 - 99 mg/dL   Comment 1 Notify RN    Comment 2 Document in Chart   ECHOCARDIOGRAM LIMITED   Collection Time: 03/24/23  9:35 AM  Result Value Ref Range   Weight 2,288 oz   Height 62 in   BP 116/69 mmHg   Results for orders placed or performed during the hospital encounter of 03/23/23  Basic metabolic panel   Collection Time: 03/23/23  5:18 PM  Result Value Ref Range   Sodium 140 135 - 145 mmol/L   Potassium 3.7 3.5 - 5.1 mmol/L   Chloride 103 98 - 111 mmol/L   CO2 25 22 - 32 mmol/L   Glucose, Bld 141 (H) 70 - 99 mg/dL   BUN 24 (H) 8 - 23 mg/dL   Creatinine, Ser 8.62 (H) 0.44 - 1.00 mg/dL   Calcium  9.7 8.9 - 10.3 mg/dL   GFR, Estimated 40 (L) >60 mL/min   Anion gap 12 5 - 15  CBC   Collection Time: 03/23/23  5:18 PM  Result Value Ref Range   WBC 8.2 4.0 - 10.5 K/uL   RBC 5.24 (H) 3.87 - 5.11 MIL/uL   Hemoglobin 14.6 12.0 - 15.0 g/dL   HCT 55.2 63.9 - 53.9 %   MCV 85.3 80.0 - 100.0 fL   MCH 27.9 26.0 - 34.0 pg   MCHC 32.7 30.0 - 36.0 g/dL   RDW 84.7 88.4 - 84.4 %   Platelets 166 150 - 400 K/uL   nRBC 0.0 0.0 - 0.2 %  Troponin I (High Sensitivity)   Collection Time: 03/23/23  5:18 PM  Result Value Ref Range   Troponin I (High Sensitivity) 5 <18 ng/L  Troponin I (High Sensitivity)   Collection Time: 03/23/23  7:13 PM  Result Value Ref Range   Troponin I (High Sensitivity) 5 <18 ng/L  CBG monitoring, ED   Collection Time: 03/23/23 10:27 PM  Result Value Ref Range   Glucose-Capillary 100 (H) 70 - 99 mg/dL  Brain natriuretic peptide   Collection Time: 03/24/23 12:34 AM  Result Value Ref Range  B Natriuretic Peptide 43.0 0.0 - 100.0 pg/mL  Troponin I  (High Sensitivity)   Collection Time: 03/24/23 12:34 AM  Result Value Ref Range   Troponin I (High Sensitivity) 5 <18 ng/L  Comprehensive metabolic panel   Collection Time: 03/24/23  2:31 AM  Result Value Ref Range   Sodium 138 135 - 145 mmol/L   Potassium 3.3 (L) 3.5 - 5.1 mmol/L   Chloride 101 98 - 111 mmol/L   CO2 23 22 - 32 mmol/L   Glucose, Bld 129 (H) 70 - 99 mg/dL   BUN 24 (H) 8 - 23 mg/dL   Creatinine, Ser 8.56 (H) 0.44 - 1.00 mg/dL   Calcium  8.9 8.9 - 10.3 mg/dL   Total Protein 6.6 6.5 - 8.1 g/dL   Albumin 3.6 3.5 - 5.0 g/dL   AST 34 15 - 41 U/L   ALT 23 0 - 44 U/L   Alkaline Phosphatase 47 38 - 126 U/L   Total Bilirubin 1.2 0.0 - 1.2 mg/dL   GFR, Estimated 38 (L) >60 mL/min   Anion gap 14 5 - 15  CBC   Collection Time: 03/24/23  2:31 AM  Result Value Ref Range   WBC 7.6 4.0 - 10.5 K/uL   RBC 4.45 3.87 - 5.11 MIL/uL   Hemoglobin 12.2 12.0 - 15.0 g/dL   HCT 61.4 63.9 - 53.9 %   MCV 86.5 80.0 - 100.0 fL   MCH 27.4 26.0 - 34.0 pg   MCHC 31.7 30.0 - 36.0 g/dL   RDW 84.4 88.4 - 84.4 %   Platelets 128 (L) 150 - 400 K/uL   nRBC 0.0 0.0 - 0.2 %  Creatinine, urine, random   Collection Time: 03/24/23  2:31 AM  Result Value Ref Range   Creatinine, Urine 154 mg/dL  Sodium, urine, random   Collection Time: 03/24/23  2:31 AM  Result Value Ref Range   Sodium, Ur 47 mmol/L  Urinalysis, Routine w reflex microscopic -Urine, Clean Catch   Collection Time: 03/24/23  2:31 AM  Result Value Ref Range   Color, Urine YELLOW YELLOW   APPearance HAZY (A) CLEAR   Specific Gravity, Urine 1.016 1.005 - 1.030   pH 5.0 5.0 - 8.0   Glucose, UA >=500 (A) NEGATIVE mg/dL   Hgb urine dipstick NEGATIVE NEGATIVE   Bilirubin Urine NEGATIVE NEGATIVE   Ketones, ur NEGATIVE NEGATIVE mg/dL   Protein, ur NEGATIVE NEGATIVE mg/dL   Nitrite NEGATIVE NEGATIVE   Leukocytes,Ua MODERATE (A) NEGATIVE   RBC / HPF 0-5 0 - 5 RBC/hpf   WBC, UA 21-50 0 - 5 WBC/hpf   Bacteria, UA NONE SEEN NONE SEEN    Squamous Epithelial / HPF 0-5 0 - 5 /HPF   Mucus PRESENT   Troponin I (High Sensitivity)   Collection Time: 03/24/23  2:31 AM  Result Value Ref Range   Troponin I (High Sensitivity) 5 <18 ng/L  Troponin I (High Sensitivity)   Collection Time: 03/24/23  4:30 AM  Result Value Ref Range   Troponin I (High Sensitivity) 5 <18 ng/L  CBG monitoring, ED   Collection Time: 03/24/23  7:48 AM  Result Value Ref Range   Glucose-Capillary 111 (H) 70 - 99 mg/dL   Comment 1 Notify RN    Comment 2 Document in Chart   ECHOCARDIOGRAM LIMITED   Collection Time: 03/24/23  9:35 AM  Result Value Ref Range   Weight 2,288 oz   Height 62 in   BP 116/69 mmHg  EKG EKG Interpretation Date/Time:  Friday March 23 2023 15:54:59 EST Ventricular Rate:  91 PR Interval:  146 QRS Duration:  72 QT Interval:  430 QTC Calculation: 528 R Axis:   -32  Text Interpretation: Normal sinus rhythm Possible Left atrial enlargement Left axis deviation Inferior infarct , age undetermined Anterolateral infarct , age undetermined Prolonged QT Abnormal ECG When compared with ECG of 08-Mar-2023 10:22, PREVIOUS ECG IS PRESENT Confirmed by Pamella Sharper [phone]) on 03/23/2023 4:22:52 PM  Radiology DG Chest Portable 1 View Result Date: 03/23/2023 CLINICAL DATA:  Chest pain, shortness of breath EXAM: PORTABLE CHEST 1 VIEW COMPARISON:  12/11/2022 FINDINGS: Heart and mediastinal contours are within normal limits. No focal opacities or effusions. No acute bony abnormality. Aortic atherosclerosis. IMPRESSION: No active disease. Electronically Signed   By: Franky Crease M.D.   On: 03/23/2023 20:17    Procedures Procedures    Medications Ordered in ED Medications  isosorbide  mononitrate (IMDUR ) 24 hr tablet 30 mg (30 mg Oral Given 03/24/23 0912)  ezetimibe  (ZETIA ) tablet 10 mg (10 mg Oral Given 03/24/23 0912)  rosuvastatin  (CRESTOR ) tablet 20 mg (20 mg Oral Given 03/24/23 0912)  FLUoxetine  (PROZAC ) capsule 40 mg (40 mg Oral Given  03/24/23 0912)  levothyroxine  (SYNTHROID ) tablet 50 mcg (50 mcg Oral Given 03/24/23 0620)  pantoprazole  (PROTONIX ) EC tablet 20 mg (20 mg Oral Given 03/24/23 0911)  rOPINIRole  (REQUIP ) tablet 1 mg (1 mg Oral Given 03/23/23 2259)  heparin  injection 5,000 Units (5,000 Units Subcutaneous Given 03/24/23 0620)  0.9 %  sodium chloride  infusion (0 mLs Intravenous Paused 03/24/23 0915)  sodium chloride  flush (NS) 0.9 % injection 3 mL (3 mLs Intravenous Not Given 03/24/23 0923)  sodium chloride  flush (NS) 0.9 % injection 3 mL (has no administration in time range)  0.9 %  sodium chloride  infusion (has no administration in time range)  acetaminophen  (TYLENOL ) tablet 650 mg (has no administration in time range)    Or  acetaminophen  (TYLENOL ) suppository 650 mg (has no administration in time range)  ondansetron  (ZOFRAN ) tablet 4 mg (has no administration in time range)    Or  ondansetron  (ZOFRAN ) injection 4 mg (has no administration in time range)  docusate sodium  (COLACE) capsule 100 mg (100 mg Oral Given 03/24/23 0912)  insulin  aspart (novoLOG ) injection 0-6 Units ( Subcutaneous Not Given 03/24/23 0814)  insulin  aspart (novoLOG ) injection 0-5 Units ( Subcutaneous Not Given 03/23/23 2244)  clopidogrel  (PLAVIX ) tablet 75 mg (75 mg Oral Given 03/24/23 0912)  aspirin  EC tablet 81 mg (81 mg Oral Given 03/24/23 0912)  morphine  (PF) 2 MG/ML injection 2 mg (2 mg Intravenous Given 03/23/23 1733)  morphine  (PF) 4 MG/ML injection 4 mg (4 mg Intravenous Given 03/23/23 2040)  potassium chloride  SA (KLOR-CON  M) CR tablet 40 mEq (40 mEq Oral Given 03/24/23 0913)    ED Course/ Medical Decision Making/ A&P                                 Medical Decision Making This patient presents to the ED for concern of chest pain, this involves an extensive number of treatment options, and is a complaint that carries with it a high risk of complications and morbidity.  The differential diagnosis includes ACS, PNA, PTX, PE   Co morbidities that  complicate the patient evaluation  Recent cardiac cath with LAD stent   Additional history obtained:  Additional history and/or information obtained from chart review, notable  for REcent cartheterization: Cardiac Catheterization 03/08/2023:     Ost RCA to Prox RCA lesion is 50% stenosed.   2nd Mrg lesion is 20% stenosed.   Mid Cx lesion is 20% stenosed.   Mid LAD-1 lesion is 30% stenosed.   Mid LAD-2 lesion is 50% stenosed.   Mid LAD-3 lesion is 99% stenosed.   A drug-eluting stent was successfully placed using a SYNERGY XD [id number].   Post intervention, there is a 0% residual stenosis.   Severe mid LAD stenosis Successful PTCA/DES x 1 mid LAD Moderate stenosis ostial RCA. This does not appear to be flow limiting.    Recommendations: Continue DAPT with ASA and Plavix  for at least six months. Same day post PCI discharge.    Lab Tests:  I Ordered, and personally interpreted labs.  The pertinent results include:  Neg troponin x 2; normal CBC, K+ 3.3, UA negative    Imaging Studies ordered:  I ordered imaging studies including CXR I independently visualized and interpreted imaging which showed NAD I agree with the radiologist interpretation   Cardiac Monitoring:  The patient was maintained on a cardiac monitor.  I personally viewed and interpreted the cardiac monitored which showed an underlying rhythm of: NSR, 81   Medicines ordered and prescription drug management:  I ordered medication including Morphine   for pain Reevaluation of the patient after these medicines showed that the patient improved I have reviewed the patients home medicines and have made adjustments as needed    Consultations Obtained:  I requested consultation with the Cardiology,  and discussed lab and imaging findings as well as pertinent plan - they recommend: advises that, as patient is pain free, neg trops, she can be discharged to outpatient follow up. Start Imdur  30 mg.    Problem  List / ED Course:  Chest pain - x months, cath on 12/19 with significant obstructive disease requiring stents despite negative troponins in the past Pain has returned on my final evaluation causing concern for discharge home.  Imdur  started per cardiology recommendation, however, feel the patient would benefit from admission for observation, formal cardiology consultation and observation  Re-consulted cardiology, Dr. Imelda (sp), who will see the patient in consultation while inpatient.    Reevaluation:  After the interventions noted above, I reevaluated the patient and found that they have :improved   Social Determinants of Health:  Good family support   Disposition:  After consideration of the diagnostic results and the patients response to treatment, I feel that the patient would benefit from admit.   Amount and/or Complexity of Data Reviewed Labs: ordered. Radiology: ordered.  Risk Prescription drug management. Decision regarding hospitalization.           Final Clinical Impression(s) / ED Diagnoses Final diagnoses:  Other chest pain    Rx / DC Orders ED Discharge Orders     None         Odell Balls, PA-C 03/24/23 1039    Pamella Sharper A, DO 03/28/23 1402

## 2023-03-23 NOTE — ED Triage Notes (Signed)
 Pt reports tightness in her chest and chest pain, states "it feels like something is sitting on my chest" onset months ago. She a stent placed about 3 weeks ago and then her CP returned 3 days later. She does report pain with breathing.

## 2023-03-24 ENCOUNTER — Observation Stay (HOSPITAL_BASED_OUTPATIENT_CLINIC_OR_DEPARTMENT_OTHER): Payer: Medicare HMO

## 2023-03-24 DIAGNOSIS — R0789 Other chest pain: Secondary | ICD-10-CM | POA: Diagnosis not present

## 2023-03-24 DIAGNOSIS — I200 Unstable angina: Secondary | ICD-10-CM | POA: Diagnosis not present

## 2023-03-24 DIAGNOSIS — R079 Chest pain, unspecified: Secondary | ICD-10-CM | POA: Diagnosis not present

## 2023-03-24 DIAGNOSIS — I25110 Atherosclerotic heart disease of native coronary artery with unstable angina pectoris: Secondary | ICD-10-CM | POA: Diagnosis not present

## 2023-03-24 DIAGNOSIS — I25118 Atherosclerotic heart disease of native coronary artery with other forms of angina pectoris: Secondary | ICD-10-CM | POA: Diagnosis not present

## 2023-03-24 LAB — CBC
HCT: 38.5 % (ref 36.0–46.0)
Hemoglobin: 12.2 g/dL (ref 12.0–15.0)
MCH: 27.4 pg (ref 26.0–34.0)
MCHC: 31.7 g/dL (ref 30.0–36.0)
MCV: 86.5 fL (ref 80.0–100.0)
Platelets: 128 10*3/uL — ABNORMAL LOW (ref 150–400)
RBC: 4.45 MIL/uL (ref 3.87–5.11)
RDW: 15.5 % (ref 11.5–15.5)
WBC: 7.6 10*3/uL (ref 4.0–10.5)
nRBC: 0 % (ref 0.0–0.2)

## 2023-03-24 LAB — URINALYSIS, ROUTINE W REFLEX MICROSCOPIC
Bacteria, UA: NONE SEEN
Bilirubin Urine: NEGATIVE
Glucose, UA: >=500 mg/dL — AB
Hgb urine dipstick: NEGATIVE
Ketones, ur: NEGATIVE mg/dL
Nitrite: NEGATIVE
Protein, ur: NEGATIVE mg/dL
Specific Gravity, Urine: 1.016 (ref 1.005–1.030)
pH: 5 (ref 5.0–8.0)

## 2023-03-24 LAB — COMPREHENSIVE METABOLIC PANEL WITH GFR
ALT: 23 U/L (ref 0–44)
AST: 34 U/L (ref 15–41)
Albumin: 3.6 g/dL (ref 3.5–5.0)
Alkaline Phosphatase: 47 U/L (ref 38–126)
Anion gap: 14 (ref 5–15)
BUN: 24 mg/dL — ABNORMAL HIGH (ref 8–23)
CO2: 23 mmol/L (ref 22–32)
Calcium: 8.9 mg/dL (ref 8.9–10.3)
Chloride: 101 mmol/L (ref 98–111)
Creatinine, Ser: 1.43 mg/dL — ABNORMAL HIGH (ref 0.44–1.00)
GFR, Estimated: 38 mL/min — ABNORMAL LOW
Glucose, Bld: 129 mg/dL — ABNORMAL HIGH (ref 70–99)
Potassium: 3.3 mmol/L — ABNORMAL LOW (ref 3.5–5.1)
Sodium: 138 mmol/L (ref 135–145)
Total Bilirubin: 1.2 mg/dL (ref 0.0–1.2)
Total Protein: 6.6 g/dL (ref 6.5–8.1)

## 2023-03-24 LAB — GLUCOSE, CAPILLARY
Glucose-Capillary: 124 mg/dL — ABNORMAL HIGH (ref 70–99)
Glucose-Capillary: 202 mg/dL — ABNORMAL HIGH (ref 70–99)

## 2023-03-24 LAB — CBG MONITORING, ED
Glucose-Capillary: 111 mg/dL — ABNORMAL HIGH (ref 70–99)
Glucose-Capillary: 121 mg/dL — ABNORMAL HIGH (ref 70–99)

## 2023-03-24 LAB — SODIUM, URINE, RANDOM: Sodium, Ur: 47 mmol/L

## 2023-03-24 LAB — ECHOCARDIOGRAM LIMITED
Est EF: >75
Height: 62 in
S' Lateral: 1.9 cm
Weight: 2288 [oz_av]

## 2023-03-24 LAB — CREATININE, URINE, RANDOM: Creatinine, Urine: 154 mg/dL

## 2023-03-24 LAB — BRAIN NATRIURETIC PEPTIDE: B Natriuretic Peptide: 43 pg/mL (ref 0.0–100.0)

## 2023-03-24 LAB — TROPONIN I (HIGH SENSITIVITY)
Troponin I (High Sensitivity): 5 ng/L (ref <18)
Troponin I (High Sensitivity): 5 ng/L (ref <18)
Troponin I (High Sensitivity): 5 ng/L (ref <18)

## 2023-03-24 MED ORDER — POTASSIUM CHLORIDE CRYS ER 20 MEQ PO TBCR
40.0000 meq | EXTENDED_RELEASE_TABLET | Freq: Once | ORAL | Status: AC
  Administered 2023-03-24: 40 meq via ORAL
  Filled 2023-03-24: qty 2

## 2023-03-24 MED ORDER — SODIUM CHLORIDE 0.9 % IV SOLN: INTRAVENOUS | Status: DC

## 2023-03-24 NOTE — ED Notes (Signed)
Patient transported to ECHO.

## 2023-03-24 NOTE — Progress Notes (Signed)
 PROGRESS NOTE  Valerie Ball  FMW:[id number] DOB: Aug 31, 1945 DOA: 03/23/2023 PCP: Randeen Laine LABOR, MD   Brief Narrative: Patient is a 78 year old female with history of coronary artery disease, status post DES placement on last December, hypertension, hyperlipidemia, hypothyroidism, mitral stenosis, NASH who presented with chest pain.  She was using sublingual hydration without relief.  Chest pain was mainly located on the left side, radiating to the left shoulder and back with associated shortness of breath.  Cardiology consulted .  Troponin have been negative.  Echocardiogram pending.  Assessment & Plan:  Principal Problem:   Unstable angina (HCC) Active Problems:   History of CAD (coronary artery disease)   Acute kidney injury superimposed on chronic kidney disease (HCC)   Essential hypertension   Non-insulin  treated type 2 diabetes mellitus (HCC)   Hypothyroidism   IBS (irritable bowel syndrome)   Hiatal hernia   Hyperlipidemia   Chronic idiopathic thrombocytopenia (HCC)  Unstable angina/history of coronary artery disease: Status post DES placement on 12/19.  On dual antiplatelet therapy.  Compliant.  History of multivessel coronary artery disease.  Presented with chest pain not responding to nitroglycerin . Cardiology consulted.  Troponins have been cycled and negative.  EKG did not show any acute ischemic changes.  Started on Imdur .  Echocardiogram pending. Continues to complain of chest pain this morning, rates 5/10  Again CKD stage II: Baseline creatinine normal.  Presented with AKI with creatinine of 1.3, started on gentle IV fluids.  Hypokalemia: Supplemented.  With potassium  Hypertension: Currently antihypertensives on hold because blood pressure soft on presentation.  Currently blood pressure stable during my evaluation in the ED.  GAD: On Prozac   Restless leg syndrome : On ropinirole   Non-insulin -dependent diabetes type 2: On Trulicity , Jardiance , replacing at home.   Monitor blood sugars.  Continue sliding scale  History of hiatal hernia/gastroparesis/GERD: Continue PPI  History of hyperlipidemia: On Crestor , ezetimibe   History of hypothyroidism: On levothyroxine   Thrombocytopenia: Chronic, stable         DVT prophylaxis:heparin  injection 5,000 Units Start: 03/23/23 2200 SCDs Start: 03/23/23 2132 Place TED hose Start: 03/23/23 2132     Code Status: Full Code  Family Communication: Daughter at bedside  Patient status:Obs  Patient is from :home  Anticipated discharge un:ynfz  Estimated DC date: After full workup   Consultants: Cardiology  Procedures: None  Antimicrobials:  Anti-infectives (From admission, onward)    None       Subjective: Patient seen and examined at bedside today.  Hemodynamically stable lying in bed.  Still complaining of chest pain during evaluation, not in distress.  Rates the chest pain as 5/10.  Put on 2 L of oxygen for comfort  Objective: Vitals:   03/24/23 0500 03/24/23 0600 03/24/23 0621 03/24/23 0908  BP: (!) 99/57 (!) 93/51 104/65 116/69  Pulse: 71 66 69 72  Resp: 17 15 19 17   Temp:   (!) 97.1 F (36.2 C) (!) 97.5 F (36.4 C)  TempSrc:   Oral Oral  SpO2: 97% 98% 97% 95%  Weight:      Height:        Intake/Output Summary (Last 24 hours) at 03/24/2023 0934 Last data filed at 03/23/2023 2155 Gross per 24 hour  Intake 240 ml  Output --  Net 240 ml   Filed Weights   03/23/23 1547  Weight: 64.9 kg    Examination:  General exam: Overall comfortable, not in distress HEENT: PERRL Respiratory system:  no wheezes or crackles  Cardiovascular system: S1 &  S2 heard, RRR.  Gastrointestinal system: Abdomen is nondistended, soft and nontender. Central nervous system: Alert and oriented Extremities: No edema, no clubbing ,no cyanosis Skin: No rashes, no ulcers,no icterus     Data Reviewed: I have personally reviewed following labs and imaging studies  CBC: Recent Labs  Lab  03/23/23 1718 03/24/23 0231  WBC 8.2 7.6  HGB 14.6 12.2  HCT 44.7 38.5  MCV 85.3 86.5  PLT 166 128*   Basic Metabolic Panel: Recent Labs  Lab 03/23/23 1501 03/23/23 1718 03/24/23 0231  NA  --  140 138  K  --  3.7 3.3*  CL  --  103 101  CO2  --  25 23  GLUCOSE 233* 141* 129*  BUN  --  24* 24*  CREATININE  --  1.37* 1.43*  CALCIUM   --  9.7 8.9     No results found for this or any previous visit (from the past 240 hours).   Radiology Studies: DG Chest Portable 1 View Result Date: 03/23/2023 CLINICAL DATA:  Chest pain, shortness of breath EXAM: PORTABLE CHEST 1 VIEW COMPARISON:  12/11/2022 FINDINGS: Heart and mediastinal contours are within normal limits. No focal opacities or effusions. No acute bony abnormality. Aortic atherosclerosis. IMPRESSION: No active disease. Electronically Signed   By: Franky Crease M.D.   On: 03/23/2023 20:17    Scheduled Meds:  aspirin  EC  81 mg Oral Daily   clopidogrel   75 mg Oral Daily   docusate sodium   100 mg Oral BID   ezetimibe   10 mg Oral Daily   FLUoxetine   40 mg Oral Daily   heparin   5,000 Units Subcutaneous Q8H   insulin  aspart  0-5 Units Subcutaneous QHS   insulin  aspart  0-6 Units Subcutaneous TID WC   isosorbide  mononitrate  30 mg Oral Daily   levothyroxine   50 mcg Oral Q0600   pantoprazole   20 mg Oral Daily   rOPINIRole   1 mg Oral QHS   rosuvastatin   20 mg Oral Daily   sodium chloride  flush  3 mL Intravenous Q12H   Continuous Infusions:  sodium chloride  Stopped (03/24/23 0915)   sodium chloride        LOS: 0 days   Ivonne Mustache, MD [facility] P1/06/2023, 9:34 AM

## 2023-03-24 NOTE — Progress Notes (Signed)
 CSW met patient at bedside to deliver moon letter.    Valerie Yu Cragun LCSW-A   03/24/2023 12:34 PM

## 2023-03-24 NOTE — Plan of Care (Signed)
  Problem: Coping: Goal: Ability to adjust to condition or change in health will improve Outcome: Progressing   Problem: Fluid Volume: Goal: Ability to maintain a balanced intake and output will improve Outcome: Progressing   Problem: Skin Integrity: Goal: Risk for impaired skin integrity will decrease Outcome: Progressing   Problem: Education: Goal: Knowledge of General Education information will improve Description: Including pain rating scale, medication(s)/side effects and non-pharmacologic comfort measures Outcome: Progressing   Problem: Clinical Measurements: Goal: Will remain free from infection Outcome: Progressing

## 2023-03-24 NOTE — Progress Notes (Signed)
 Troponin level x 5 within normal range without any delta change.  Holding to check further troponin level.

## 2023-03-24 NOTE — Care Management CC44 (Signed)
 Condition Code 44 Documentation Completed  Patient Details  Name: Valerie Ball MRN: [id number] Date of Birth: 1945-12-08   Condition Code 44 given:    Patient signature on Condition Code 44 notice:    Documentation of 2 MD's agreement:    Code 44 added to claim:       Corean JAYSON Canary, RN 03/24/2023, 11:32 AM

## 2023-03-24 NOTE — Progress Notes (Signed)
 Rounding Note    Patient Name: CAOILAINN SACKS Date of Encounter: 03/24/2023  [facility] Cardiologist: Alean SAUNDERS Madireddy, MD   Subjective   Some recurrent chest pains overnight and this morning  Inpatient Medications    Scheduled Meds:  aspirin  EC  81 mg Oral Daily   clopidogrel   75 mg Oral Daily   docusate sodium   100 mg Oral BID   ezetimibe   10 mg Oral Daily   FLUoxetine   40 mg Oral Daily   heparin   5,000 Units Subcutaneous Q8H   insulin  aspart  0-5 Units Subcutaneous QHS   insulin  aspart  0-6 Units Subcutaneous TID WC   isosorbide  mononitrate  30 mg Oral Daily   levothyroxine   50 mcg Oral Q0600   pantoprazole   20 mg Oral Daily   rOPINIRole   1 mg Oral QHS   rosuvastatin   20 mg Oral Daily   sodium chloride  flush  3 mL Intravenous Q12H   Continuous Infusions:  sodium chloride      sodium chloride  75 mL/hr at 03/24/23 1122   PRN Meds: sodium chloride , acetaminophen  **OR** acetaminophen , ondansetron  **OR** ondansetron  (ZOFRAN ) IV, sodium chloride  flush   Vital Signs    Vitals:   03/24/23 0600 03/24/23 0621 03/24/23 0908 03/24/23 1218  BP: (!) 93/51 104/65 116/69 101/63  Pulse: 66 69 72 66  Resp: 15 19 17 17   Temp:  (!) 97.1 F (36.2 C) (!) 97.5 F (36.4 C) 98 F (36.7 C)  TempSrc:  Oral Oral Oral  SpO2: 98% 97% 95% 97%  Weight:      Height:        Intake/Output Summary (Last 24 hours) at 03/24/2023 1412 Last data filed at 03/23/2023 2155 Gross per 24 hour  Intake 240 ml  Output --  Net 240 ml      03/23/2023    3:47 PM 03/23/2023    2:33 PM 03/08/2023    7:27 AM  Last 3 Weights  Weight (lbs) 143 lb 143 lb 4 oz 143 lb  Weight (kg) 64.864 kg 64.978 kg 64.864 kg      Telemetry    NSR - Personally Reviewed  ECG    N/a - Personally Reviewed  Physical Exam   GEN: No acute distress.   Neck: No JVD Cardiac: RRR, no murmurs, rubs, or gallops.  Respiratory: Clear to auscultation bilaterally. GI: Soft, nontender, non-distended  MS: No  edema; No deformity. Neuro:  Nonfocal  Psych: Normal affect   Labs    High Sensitivity Troponin:   Recent Labs  Lab 03/23/23 1718 03/23/23 1913 03/24/23 0034 03/24/23 0231 03/24/23 0430  TROPONINIHS 5 5 5 5 5      Chemistry Recent Labs  Lab 03/23/23 1501 03/23/23 1718 03/24/23 0231  NA  --  140 138  K  --  3.7 3.3*  CL  --  103 101  CO2  --  25 23  GLUCOSE 233* 141* 129*  BUN  --  24* 24*  CREATININE  --  1.37* 1.43*  CALCIUM   --  9.7 8.9  PROT  --   --  6.6  ALBUMIN  --   --  3.6  AST  --   --  34  ALT  --   --  23  ALKPHOS  --   --  47  BILITOT  --   --  1.2  GFRNONAA  --  40* 38*  ANIONGAP  --  12 14    Lipids No results for input(s):  CHOL, TRIG, HDL, LABVLDL, LDLCALC, CHOLHDL in the last 168 hours.  Hematology Recent Labs  Lab 03/23/23 1718 03/24/23 0231  WBC 8.2 7.6  RBC 5.24* 4.45  HGB 14.6 12.2  HCT 44.7 38.5  MCV 85.3 86.5  MCH 27.9 27.4  MCHC 32.7 31.7  RDW 15.2 15.5  PLT 166 128*   Thyroid  No results for input(s): TSH, FREET4 in the last 168 hours.  BNP Recent Labs  Lab 03/24/23 0034  BNP 43.0    DDimer No results for input(s): DDIMER in the last 168 hours.   Radiology    DG Chest Portable 1 View Result Date: 03/23/2023 CLINICAL DATA:  Chest pain, shortness of breath EXAM: PORTABLE CHEST 1 VIEW COMPARISON:  12/11/2022 FINDINGS: Heart and mediastinal contours are within normal limits. No focal opacities or effusions. No acute bony abnormality. Aortic atherosclerosis. IMPRESSION: No active disease. Electronically Signed   By: Franky Crease M.D.   On: 03/23/2023 20:17    Cardiac Studies     Patient Profile     SARAI JANUARY is a 78 y.o. female with a hx of  hypertension, hyperlipidemia, hypothyroidism, seizure disorder, chronic thrombocytopenia, gastroparesis, NASH, chronic dizziness, chronic microvascular ischemic changes on CT head September 2024, hemorrhoids with CAD s/p PCI to mid LAD who is being seen today for  the evaluation of chest pain at the request of ED.   Assessment & Plan    1.Chest pain/History of CAD - prior history of PCI to LAD 02/2023.  - presented with chest pain - trops neg, EKG SR no ischemic changes - limited echo shows hyperdynamic LV without any WMAs  - no objective evidence of ischemia. Certaintly if acute issue with recent LAD stent would have been more significant findings. - perhaps vasospasm or micorvasc disease.  - imdur  30mg  daily has been added - some ongoing chest pains, follow on imdur  today and adjust antianginal as needed. No indication for repeat cath at this time   2. AKI - IVFs per primary team - poor oral hydration, small IVC on echo. Likely prerenal  For questions or updates, please contact [facility] Please consult [email or web address] for contact info under        Signed, Alvan Carrier, MD  03/24/2023, 2:12 PM

## 2023-03-24 NOTE — ED Notes (Signed)
 ED TO INPATIENT HANDOFF REPORT  ED Nurse Name and Phone #: Huan Pollok [phone]  S Name/Age/Gender Valerie Ball 78 y.o. female Room/Bed: 038C/038C  Code Status   Code Status: Full Code  Home/SNF/Other Home Patient oriented to: self, place, time, and situation Is this baseline? Yes   Triage Complete: Triage complete  Chief Complaint Unstable angina (HCC) [I20.0]  Triage Note Pt reports tightness in her chest and chest pain, states it feels like something is sitting on my chest onset months ago. She a stent placed about 3 weeks ago and then her CP returned 3 days later. She does report pain with breathing.   Allergies Allergies  Allergen Reactions   Arimidex [Anastrozole] Other (See Comments)    Joint pain   Glipizide  Other (See Comments)    Hypoglycemia    Metformin  And Related Other (See Comments)    diarrhea   Penicillins Hives and Rash    Level of Care/Admitting Diagnosis ED Disposition     ED Disposition  Admit   Condition  --   Comment  Hospital Area: [facility] [[id number]]  Level of Care: Telemetry Cardiac [[id number]]  May place patient in observation at [facility] or [facility] if equivalent level of care is available:: No  Covid Evaluation: Asymptomatic - no recent exposure (last 10 days) testing not required  Diagnosis: Unstable angina [facility]) [[id number]]  Admitting Physician: SUNDIL, SUBRINA [[id number]]  Attending Physician: SUNDIL, SUBRINA [[id number]]          B Medical/Surgery History Past Medical History:  Diagnosis Date   Allergic rhinitis 08/22/2006   Qualifier: Diagnosis of   By: Cecilie REYNOLDS NANNIE Mitzie      IMO SNOMED Dx Update Oct 2024     Allergy    allegic rhinitis   Alternating constipation and diarrhea 08/12/2015   Anemia    Angiodysplasia of colon    Anxiety    Arthritis    AVM (arteriovenous malformation) of colon    Bilateral knee pain 05/11/2022   Blurred vision, right eye 08/11/2021   Patient does have blurred  vision right eye,     Borderline diabetic    per patient medical history form   Branch retinal vein occlusion with macular edema of left eye 07/14/2019   CME superior to the fovea OS has worsened at 7-week interval from macular branch retinal vein occlusion, repeat Avastin  today and evaluate exam in 6 weeks     Breast cancer (HCC) 02/2009   breast CA L invasive ductal CA(hormone receptor positive.)   CAD (coronary artery disease)  CT Cardiac 01/19/2023 calcium  score 411, plaque volume 366, moderate stenosis LAD, mild stenosis D1, RCA, LCx/OM 3 02/01/2023   Chest pain 12/29/2022   Colon cancer screening 01/23/2014   Colon polyp    hyperplastic   Colon stricture (HCC) 01/14/2003   Cystoid macular edema of left eye 07/14/2019   Decreased calculated GFR 05/11/2022   Diabetic gastroparesis (HCC) 03/27/2016   Typical symptoms -suspect      No detectable diabetic retinopathy 12-27-2020     Diabetic neuropathy (HCC) 11/25/2013   Diaphragmatic hernia 03/02/2008   Qualifier: Diagnosis of   By: Genie CMA LEODIS), Leisha      IMO SNOMED Dx Update Oct 2024     Diverticulitis of colon 10/14/2014   Diverticulosis    Dizziness 01/07/2014   Dyspnea on exertion 01/04/2023   Encounter for Medicare annual wellness exam 01/23/2014   ESOPHAGEAL STRICTURE 03/02/2008   Qualifier: Diagnosis of  By: Kowalk CMA (AAMA), Chick         Essential hypertension 08/22/2006   Qualifier: Diagnosis of   By: Cecilie CMA, NANNIE Ivy         Estrogen deficiency 01/23/2014   Fatigue 05/20/2018   Fatty liver    Fever, low grade 09/24/2020   GAD (generalized anxiety disorder) 04/15/2007   Qualifier: Diagnosis of   By: Baird FNP, Frieda Kipper        GI bleed    Gout    Hepatic steatosis    Hiatal hernia    Hip pain, bilateral 08/31/2017   History of breast cancer 08/02/2009   Qualifier: Diagnosis of   By: Randeen MD, Laine Caldron        History of COVID-19 09/24/2020   History of removal of implants of both  breasts 07/01/2013   06/25/13 elected to have bilateral breast implants removed.     History of seizure 08/22/2006   Qualifier: Diagnosis of   By: Cecilie CMA, NANNIE Ivy         Hyperlipidemia    Hyperlipidemia associated with type 2 diabetes mellitus (HCC) 08/22/2006   Qualifier: Diagnosis of   By: Cecilie CMA, NANNIE Ivy         Hypertension    Hypertensive retinopathy of left eye 07/14/2019   Hypothyroidism    IBS (irritable bowel syndrome)    Lap Nissen October 2013 01/19/2012   Laparoscopic takedown of large type III mixed hiatus hernia with primary closure of the diaphragm with 3 sutures posteriorally (post 2 with pledgets) and Nissen fundoplication over a #56 lighted bougie        Large type III mixed hiatus hernia  12/06/2011   Plan surgery to repair diaphragm and do Nissen fundoplication     Macular edema    left eye   Melena 01/2008   anemia transfusion   Menopause    per patient medical history form   Mitral stenosis and aortic incompetence, by TTE 01/25/2023 02/01/2023   Palpitations 01/04/2023   Peritoneal adhesions 03/02/2008   Qualifier: Diagnosis of   By: Genie CMA LEODIS), Leisha      IMO SNOMED Dx Update Oct 2024     Poor balance 01/30/2023   Poorly controlled type 2 diabetes mellitus with neuropathy (HCC) 08/19/2007   Qualifier: Diagnosis of   By: Randeen MD, Laine Caldron        Posterior vitreous detachment of right eye 11/06/2019   Postmenopausal hormone therapy    per patient medical history form.   Retinal vein occlusion, branch 01/03/2016   Left with hemorrhage and edema (Dr Elner) 2017     Routine general medical examination at a health care facility 07/11/2011   Salmonella 05/2000   renal effects secondary to dehydration   Seizure disorder (HCC)    Seizures (HCC)    one seizure several yrs ago --etiology unknown-no problem since   Stress reaction 10/25/2011   SYNDROME, CARPAL TUNNEL 08/23/2006   Qualifier: Diagnosis of   By: Randeen MD, Laine Caldron         SYNDROME, RESTLESS LEGS 08/23/2006   Qualifier: Diagnosis of   By: Randeen MD, Laine Caldron        Thrombocytopenia (HCC) 11/25/2013   TRANSAMINASES, SERUM, ELEVATED 11/11/2009   Qualifier: Diagnosis of   By: Randeen MD, Laine Caldron        Tremor 01/20/2014   Head and right hand      Vitamin B12 deficiency 09/21/2016   Vitreous  floaters of left eye 07/14/2019   The nature of vitreous floaters was discussed with the patient as well as their frequent occurrence with development of posterior vitreous detachment. The significance of flashes and field cuts was discussed with the patient. The need for a dilated fundus examination was discussed and advice given to return immediately for new or different floaters .  More uncommonly, vitreous floaters, debris, or   Past Surgical History:  Procedure Laterality Date   ABDOMINAL HYSTERECTOMY  1983   BREAST SURGERY  02/2009   breast biopsy invasive ductal CA-bilateral mastectomies   CORONARY STENT INTERVENTION N/A 03/08/2023   Procedure: CORONARY STENT INTERVENTION;  Surgeon: Verlin Lonni BIRCH, MD;  Location: [facility]AS[facility];  Service: Cardiovascular;  Laterality: N/A;   EPIGASTRIC HERNIA REPAIR  01/02/2012   Procedure: HERNIA REPAIR EPIGASTRIC ADULT;  Surgeon: Donnice KATHEE Lunger, MD;  Location: [facility] OR[facility];  Service: General;  Laterality: N/A;  Laparoscopic Repair Paraesophageal Hernia, Nissen   EYE SURGERY Left 07/2017   Dr. Elner GULA CYST EXCISION     LAPAROSCOPIC NISSEN FUNDOPLICATION  01/02/2012   Procedure: LAPAROSCOPIC NISSEN FUNDOPLICATION;  Surgeon: Donnice KATHEE Lunger, MD;  Location: [facility] OR[facility];  Service: General;  Laterality: N/A;   LEFT HEART CATH AND CORONARY ANGIOGRAPHY N/A 03/08/2023   Procedure: LEFT HEART CATH AND CORONARY ANGIOGRAPHY;  Surgeon: Verlin Lonni BIRCH, MD;  Location: [facility];  Service: Cardiovascular;  Laterality: N/A;   MASTECTOMY  04/2009   Bilateral for ductal carcinoma on L   OVARY SURGERY  1986    ovarian tumor removed   TUBAL LIGATION       A IV Location/Drains/Wounds Patient Lines/Drains/Airways Status     Active Line/Drains/Airways     Name Placement date Placement time Site Days   Peripheral IV 03/23/23 20 G Right Antecubital 03/23/23  1717  Antecubital  1            Intake/Output Last 24 hours  Intake/Output Summary (Last 24 hours) at 03/24/2023 0955 Last data filed at 03/23/2023 2155 Gross per 24 hour  Intake 240 ml  Output --  Net 240 ml    Labs/Imaging Results for orders placed or performed during the hospital encounter of 03/23/23 (from the past 48 hours)  Basic metabolic panel     Status: Abnormal   Collection Time: 03/23/23  5:18 PM  Result Value Ref Range   Sodium 140 135 - 145 mmol/L   Potassium 3.7 3.5 - 5.1 mmol/L   Chloride 103 98 - 111 mmol/L   CO2 25 22 - 32 mmol/L   Glucose, Bld 141 (H) 70 - 99 mg/dL    Comment: Glucose reference range applies only to samples taken after fasting for at least 8 hours.   BUN 24 (H) 8 - 23 mg/dL   Creatinine, Ser 8.62 (H) 0.44 - 1.00 mg/dL   Calcium  9.7 8.9 - 10.3 mg/dL   GFR, Estimated 40 (L) >60 mL/min    Comment: (NOTE) Calculated using the CKD-EPI Creatinine Equation (2021)    Anion gap 12 5 - 15    Comment: Performed at [facility], [address], [place], [place] [address]  CBC     Status: Abnormal   Collection Time: 03/23/23  5:18 PM  Result Value Ref Range   WBC 8.2 4.0 - 10.5 K/uL   RBC 5.24 (H) 3.87 - 5.11 MIL/uL   Hemoglobin 14.6 12.0 - 15.0 g/dL   HCT 55.2 63.9 - 53.9 %  MCV 85.3 80.0 - 100.0 fL   MCH 27.9 26.0 - 34.0 pg   MCHC 32.7 30.0 - 36.0 g/dL   RDW 84.7 88.4 - 84.4 %   Platelets 166 150 - 400 K/uL   nRBC 0.0 0.0 - 0.2 %    Comment: Performed at [facility], [address] [place], [place] [address]  Troponin I (High Sensitivity)     Status: None   Collection Time: 03/23/23  5:18 PM  Result Value Ref Range   Troponin I (High Sensitivity) 5 <18 ng/L    Comment:  (NOTE) Elevated high sensitivity troponin I (hsTnI) values and significant  changes across serial measurements may suggest ACS but many other  chronic and acute conditions are known to elevate hsTnI results.  Refer to the Links section for chest pain algorithms and additional  guidance. Performed at [facility], [address], [place], [place] [address]   Troponin I (High Sensitivity)     Status: None   Collection Time: 03/23/23  7:13 PM  Result Value Ref Range   Troponin I (High Sensitivity) 5 <18 ng/L    Comment: (NOTE) Elevated high sensitivity troponin I (hsTnI) values and significant  changes across serial measurements may suggest ACS but many other  chronic and acute conditions are known to elevate hsTnI results.  Refer to the Links section for chest pain algorithms and additional  guidance. Performed at [facility], [address], [place], [place] [address]   CBG monitoring, ED     Status: Abnormal   Collection Time: 03/23/23 10:27 PM  Result Value Ref Range   Glucose-Capillary 100 (H) 70 - 99 mg/dL    Comment: Glucose reference range applies only to samples taken after fasting for at least 8 hours.  Brain natriuretic peptide     Status: None   Collection Time: 03/24/23 12:34 AM  Result Value Ref Range   B Natriuretic Peptide 43.0 0.0 - 100.0 pg/mL    Comment: Performed at [facility], [address], [place], [place] [address]  Troponin I (High Sensitivity)     Status: None   Collection Time: 03/24/23 12:34 AM  Result Value Ref Range   Troponin I (High Sensitivity) 5 <18 ng/L    Comment: (NOTE) Elevated high sensitivity troponin I (hsTnI) values and significant  changes across serial measurements may suggest ACS but many other  chronic and acute conditions are known to elevate hsTnI results.  Refer to the Links section for chest pain algorithms and additional  guidance. Performed at [facility], [address] [place],  [place] [address]   Comprehensive metabolic panel     Status: Abnormal   Collection Time: 03/24/23  2:31 AM  Result Value Ref Range   Sodium 138 135 - 145 mmol/L   Potassium 3.3 (L) 3.5 - 5.1 mmol/L   Chloride 101 98 - 111 mmol/L   CO2 23 22 - 32 mmol/L   Glucose, Bld 129 (H) 70 - 99 mg/dL    Comment: Glucose reference range applies only to samples taken after fasting for at least 8 hours.   BUN 24 (H) 8 - 23 mg/dL   Creatinine, Ser 8.56 (H) 0.44 - 1.00 mg/dL   Calcium  8.9 8.9 - 10.3 mg/dL   Total Protein 6.6 6.5 - 8.1 g/dL   Albumin 3.6 3.5 - 5.0 g/dL   AST 34 15 - 41 U/L   ALT 23 0 - 44 U/L  Alkaline Phosphatase 47 38 - 126 U/L   Total Bilirubin 1.2 0.0 - 1.2 mg/dL   GFR, Estimated 38 (L) >60 mL/min    Comment: (NOTE) Calculated using the CKD-EPI Creatinine Equation (2021)    Anion gap 14 5 - 15    Comment: Performed at [facility] Lab, [address], [place], [place] [address]  CBC     Status: Abnormal   Collection Time: 03/24/23  2:31 AM  Result Value Ref Range   WBC 7.6 4.0 - 10.5 K/uL   RBC 4.45 3.87 - 5.11 MIL/uL   Hemoglobin 12.2 12.0 - 15.0 g/dL   HCT 61.4 63.9 - 53.9 %   MCV 86.5 80.0 - 100.0 fL   MCH 27.4 26.0 - 34.0 pg   MCHC 31.7 30.0 - 36.0 g/dL   RDW 84.4 88.4 - 84.4 %   Platelets 128 (L) 150 - 400 K/uL   nRBC 0.0 0.0 - 0.2 %    Comment: Performed at [facility], [address], [place], [place] [address]  Creatinine, urine, random     Status: None   Collection Time: 03/24/23  2:31 AM  Result Value Ref Range   Creatinine, Urine 154 mg/dL    Comment: Performed at [facility], [address], [place], [place] [address]  Sodium, urine, random     Status: None   Collection Time: 03/24/23  2:31 AM  Result Value Ref Range   Sodium, Ur 47 mmol/L    Comment: Performed at [facility], [address] [place], [place] [address]  Urinalysis, Routine w reflex microscopic -Urine, Clean Catch     Status: Abnormal   Collection Time: 03/24/23  2:31  AM  Result Value Ref Range   Color, Urine YELLOW YELLOW   APPearance HAZY (A) CLEAR   Specific Gravity, Urine 1.016 1.005 - 1.030   pH 5.0 5.0 - 8.0   Glucose, UA >=500 (A) NEGATIVE mg/dL   Hgb urine dipstick NEGATIVE NEGATIVE   Bilirubin Urine NEGATIVE NEGATIVE   Ketones, ur NEGATIVE NEGATIVE mg/dL   Protein, ur NEGATIVE NEGATIVE mg/dL   Nitrite NEGATIVE NEGATIVE   Leukocytes,Ua MODERATE (A) NEGATIVE   RBC / HPF 0-5 0 - 5 RBC/hpf   WBC, UA 21-50 0 - 5 WBC/hpf   Bacteria, UA NONE SEEN NONE SEEN   Squamous Epithelial / HPF 0-5 0 - 5 /HPF   Mucus PRESENT     Comment: Performed at [facility], [address], [place], [place] [address]  Troponin I (High Sensitivity)     Status: None   Collection Time: 03/24/23  2:31 AM  Result Value Ref Range   Troponin I (High Sensitivity) 5 <18 ng/L    Comment: (NOTE) Elevated high sensitivity troponin I (hsTnI) values and significant  changes across serial measurements may suggest ACS but many other  chronic and acute conditions are known to elevate hsTnI results.  Refer to the Links section for chest pain algorithms and additional  guidance. Performed at [facility], [address], [place], [place] [address]   Troponin I (High Sensitivity)     Status: None   Collection Time: 03/24/23  4:30 AM  Result Value Ref Range   Troponin I (High Sensitivity) 5 <18 ng/L    Comment: (NOTE) Elevated high sensitivity troponin I (hsTnI) values and significant  changes across serial measurements may suggest ACS but many other  chronic and acute conditions are known to elevate  hsTnI results.  Refer to the Links section for chest pain algorithms and additional  guidance. Performed at [facility], [address] [place], [place] [address]   CBG monitoring, ED     Status: Abnormal   Collection Time: 03/24/23  7:48 AM  Result Value Ref Range   Glucose-Capillary 111 (H) 70 - 99 mg/dL    Comment: Glucose reference range applies  only to samples taken after fasting for at least 8 hours.   Comment 1 Notify RN    Comment 2 Document in Chart    DG Chest Portable 1 View Result Date: 03/23/2023 CLINICAL DATA:  Chest pain, shortness of breath EXAM: PORTABLE CHEST 1 VIEW COMPARISON:  12/11/2022 FINDINGS: Heart and mediastinal contours are within normal limits. No focal opacities or effusions. No acute bony abnormality. Aortic atherosclerosis. IMPRESSION: No active disease. Electronically Signed   By: Franky Crease M.D.   On: 03/23/2023 20:17    Pending Labs Unresulted Labs (From admission, onward)     Start     Ordered   03/25/23 0500  Basic metabolic panel  Tomorrow morning,   R        03/24/23 0934   03/23/23 2140  Hemoglobin A1c  Once,   R       Comments: To assess prior glycemic control    03/23/23 2139            Vitals/Pain Today's Vitals   03/24/23 0600 03/24/23 0621 03/24/23 0908 03/24/23 0911  BP: (!) 93/51 104/65 116/69   Pulse: 66 69 72   Resp: 15 19 17    Temp:  (!) 97.1 F (36.2 C) (!) 97.5 F (36.4 C)   TempSrc:  Oral Oral   SpO2: 98% 97% 95%   Weight:      Height:      PainSc:    3     Isolation Precautions No active isolations  Medications Medications  isosorbide  mononitrate (IMDUR ) 24 hr tablet 30 mg (30 mg Oral Given 03/24/23 0912)  ezetimibe  (ZETIA ) tablet 10 mg (10 mg Oral Given 03/24/23 0912)  rosuvastatin  (CRESTOR ) tablet 20 mg (20 mg Oral Given 03/24/23 0912)  FLUoxetine  (PROZAC ) capsule 40 mg (40 mg Oral Given 03/24/23 0912)  levothyroxine  (SYNTHROID ) tablet 50 mcg (50 mcg Oral Given 03/24/23 0620)  pantoprazole  (PROTONIX ) EC tablet 20 mg (20 mg Oral Given 03/24/23 0911)  rOPINIRole  (REQUIP ) tablet 1 mg (1 mg Oral Given 03/23/23 2259)  heparin  injection 5,000 Units (5,000 Units Subcutaneous Given 03/24/23 0620)  0.9 %  sodium chloride  infusion (0 mLs Intravenous Paused 03/24/23 0915)  sodium chloride  flush (NS) 0.9 % injection 3 mL (3 mLs Intravenous Not Given 03/24/23 0923)  sodium chloride   flush (NS) 0.9 % injection 3 mL (has no administration in time range)  0.9 %  sodium chloride  infusion (has no administration in time range)  acetaminophen  (TYLENOL ) tablet 650 mg (has no administration in time range)    Or  acetaminophen  (TYLENOL ) suppository 650 mg (has no administration in time range)  ondansetron  (ZOFRAN ) tablet 4 mg (has no administration in time range)    Or  ondansetron  (ZOFRAN ) injection 4 mg (has no administration in time range)  docusate sodium  (COLACE) capsule 100 mg (100 mg Oral Given 03/24/23 0912)  insulin  aspart (novoLOG ) injection 0-6 Units ( Subcutaneous Not Given 03/24/23 0814)  insulin  aspart (novoLOG ) injection 0-5 Units ( Subcutaneous Not Given 03/23/23 2244)  clopidogrel  (PLAVIX ) tablet 75 mg (75 mg Oral Given 03/24/23 0912)  aspirin   EC tablet 81 mg (81 mg Oral Given 03/24/23 0912)  morphine  (PF) 2 MG/ML injection 2 mg (2 mg Intravenous Given 03/23/23 1733)  morphine  (PF) 4 MG/ML injection 4 mg (4 mg Intravenous Given 03/23/23 2040)  potassium chloride  SA (KLOR-CON  M) CR tablet 40 mEq (40 mEq Oral Given 03/24/23 0913)    Mobility walks with person assist     Focused Assessments Cardiac Assessment Handoff:  Cardiac Rhythm: Normal sinus rhythm No results found for: CKTOTAL, CKMB, CKMBINDEX, TROPONINI Lab Results  Component Value Date   DDIMER <0.27 12/11/2022   Does the Patient currently have chest pain? Yes    R Recommendations: See Admitting Provider Note  Report given to:   Additional Notes: none

## 2023-03-24 NOTE — Plan of Care (Signed)

## 2023-03-25 DIAGNOSIS — I25118 Atherosclerotic heart disease of native coronary artery with other forms of angina pectoris: Secondary | ICD-10-CM | POA: Diagnosis not present

## 2023-03-25 DIAGNOSIS — I25110 Atherosclerotic heart disease of native coronary artery with unstable angina pectoris: Secondary | ICD-10-CM | POA: Diagnosis not present

## 2023-03-25 DIAGNOSIS — D696 Thrombocytopenia, unspecified: Secondary | ICD-10-CM | POA: Diagnosis not present

## 2023-03-25 DIAGNOSIS — R0789 Other chest pain: Secondary | ICD-10-CM | POA: Diagnosis not present

## 2023-03-25 DIAGNOSIS — N179 Acute kidney failure, unspecified: Secondary | ICD-10-CM | POA: Diagnosis not present

## 2023-03-25 DIAGNOSIS — E039 Hypothyroidism, unspecified: Secondary | ICD-10-CM | POA: Diagnosis not present

## 2023-03-25 DIAGNOSIS — E1122 Type 2 diabetes mellitus with diabetic chronic kidney disease: Secondary | ICD-10-CM | POA: Diagnosis not present

## 2023-03-25 DIAGNOSIS — N189 Chronic kidney disease, unspecified: Secondary | ICD-10-CM | POA: Diagnosis not present

## 2023-03-25 DIAGNOSIS — Z8616 Personal history of COVID-19: Secondary | ICD-10-CM | POA: Diagnosis not present

## 2023-03-25 DIAGNOSIS — I200 Unstable angina: Secondary | ICD-10-CM | POA: Diagnosis not present

## 2023-03-25 DIAGNOSIS — E785 Hyperlipidemia, unspecified: Secondary | ICD-10-CM | POA: Diagnosis not present

## 2023-03-25 DIAGNOSIS — Z853 Personal history of malignant neoplasm of breast: Secondary | ICD-10-CM | POA: Diagnosis not present

## 2023-03-25 DIAGNOSIS — I129 Hypertensive chronic kidney disease with stage 1 through stage 4 chronic kidney disease, or unspecified chronic kidney disease: Secondary | ICD-10-CM | POA: Diagnosis not present

## 2023-03-25 DIAGNOSIS — K449 Diaphragmatic hernia without obstruction or gangrene: Secondary | ICD-10-CM | POA: Diagnosis not present

## 2023-03-25 DIAGNOSIS — K589 Irritable bowel syndrome without diarrhea: Secondary | ICD-10-CM | POA: Diagnosis not present

## 2023-03-25 LAB — GLUCOSE, CAPILLARY
Glucose-Capillary: 97 mg/dL (ref 70–99)
Glucose-Capillary: 134 mg/dL — ABNORMAL HIGH (ref 70–99)
Glucose-Capillary: 113 mg/dL — ABNORMAL HIGH (ref 70–99)
Glucose-Capillary: 189 mg/dL — ABNORMAL HIGH (ref 70–99)

## 2023-03-25 LAB — BASIC METABOLIC PANEL
Anion gap: 9 (ref 5–15)
BUN: 24 mg/dL — ABNORMAL HIGH (ref 8–23)
CO2: 24 mmol/L (ref 22–32)
Calcium: 8.6 mg/dL — ABNORMAL LOW (ref 8.9–10.3)
Chloride: 108 mmol/L (ref 98–111)
Creatinine, Ser: 1.41 mg/dL — ABNORMAL HIGH (ref 0.44–1.00)
GFR, Estimated: 39 mL/min — ABNORMAL LOW (ref >60)
Glucose, Bld: 94 mg/dL (ref 70–99)
Potassium: 3.6 mmol/L (ref 3.5–5.1)
Sodium: 141 mmol/L (ref 135–145)

## 2023-03-25 MED ORDER — ISOSORBIDE MONONITRATE ER 60 MG PO TB24
60.0000 mg | ORAL_TABLET | Freq: Every day | ORAL | Status: DC
  Administered 2023-03-25 – 2023-03-26 (×2): 60 mg via ORAL
  Filled 2023-03-25 (×3): qty 1

## 2023-03-25 NOTE — Plan of Care (Signed)

## 2023-03-25 NOTE — Progress Notes (Addendum)
 PROGRESS NOTE  LABRINA LINES  FMW:[id number] DOB: 08/24/1945 DOA: 03/23/2023 PCP: Randeen Laine LABOR, MD   Brief Narrative: Patient is a 78 year old female with history of coronary artery disease, status post PCI to LAD on 03/08/2023, hypertension, hyperlipidemia, hypothyroidism, mitral stenosis, NASH who presented with chest pain.  She was using sublingual hydration without relief.  Chest pain was mainly located on the left side, radiating to the left shoulder and back with associated shortness of breath.  Cardiology consulted .  Troponin negative, echo with hyperdynamic LV, no wall motion abnormalities  Subjective: Continues to have substernal chest pain, radiating to the back and neck, similar to ongoing chest pain she has had for about a month, worse with exertion, along with dyspnea on exertion  Assessment & Plan:  CAD, ongoing chest pain  -Recent PCI, DES to LAD on 12/19.   -Had nonobstructive CAD in other vessels  -Troponins negative, echo with hyperdynamic LV, no valve motion abnormalities  -Cards following, continue aspirin  Plavix  metoprolol , Imdur , statin -Imdur  dose increased today -Monitor with activity  Again CKD stage II: Discontinue  Hypokalemia: Supplemented  Hypertension: Antihypertensives on hold  GAD: On Prozac   Restless leg syndrome : On ropinirole   Non-insulin -dependent diabetes type 2: On Trulicity , Jardiance , at baseline -continue sliding scale  History of hiatal hernia/gastroparesis/GERD: Continue PPI  History of hyperlipidemia: On Crestor , ezetimibe   History of hypothyroidism: On levothyroxine   Thrombocytopenia: Chronic, stable  DVT prophylaxis:heparin  injection 5,000 Units Start: 03/23/23 2200 SCDs Start: 03/23/23 2132 Place TED hose Start: 03/23/23 2132     Code Status: Full Code  Family Communication: None present Disposition: Home pending improvement in symptoms, possibly tomorrow   Consultants: Cardiology  Procedures:  None  Antimicrobials:  Anti-infectives (From admission, onward)    None       Objective: Vitals:   03/25/23 0011 03/25/23 0429 03/25/23 0510 03/25/23 0730  BP: 112/67 103/60  115/69  Pulse: 72 71  71  Resp: 20 18  16   Temp: 97.7 F (36.5 C) 97.8 F (36.6 C)  97.7 F (36.5 C)  TempSrc: Oral Oral  Oral  SpO2: 94% 93%  95%  Weight:   65.5 kg   Height:        Intake/Output Summary (Last 24 hours) at 03/25/2023 1042 Last data filed at 03/25/2023 0908 Gross per 24 hour  Intake 2166.27 ml  Output --  Net 2166.27 ml   Filed Weights   03/23/23 1547 03/25/23 0510  Weight: 64.9 kg 65.5 kg    Examination: Gen: Awake, Alert, Oriented X 3,  HEENT: no JVD Lungs: Good air movement bilaterally, CTAB CVS: S1S2/RRR Abd: soft, Non tender, non distended, BS present Extremities: No edema Skin: no new rashes on exposed skin    Data Reviewed: I have personally reviewed following labs and imaging studies  CBC: Recent Labs  Lab 03/23/23 1718 03/24/23 0231  WBC 8.2 7.6  HGB 14.6 12.2  HCT 44.7 38.5  MCV 85.3 86.5  PLT 166 128*   Basic Metabolic Panel: Recent Labs  Lab 03/23/23 1501 03/23/23 1718 03/24/23 0231 03/25/23 0247  NA  --  140 138 141  K  --  3.7 3.3* 3.6  CL  --  103 101 108  CO2  --  25 23 24   GLUCOSE 233* 141* 129* 94  BUN  --  24* 24* 24*  CREATININE  --  1.37* 1.43* 1.41*  CALCIUM   --  9.7 8.9 8.6*     No results found for this or any  previous visit (from the past 240 hours).   Radiology Studies: ECHOCARDIOGRAM LIMITED Result Date: 03/24/2023    ECHOCARDIOGRAM LIMITED REPORT   Patient Name:   MASHA ORBACH Date of Exam: 03/24/2023 Medical Rec #:  [id number]        Height:       62.0 in Accession #:    [id number]       Weight:       143.0 lb Date of Birth:  09-24-1945         BSA:          1.658 m Patient Age:    77 years         BP:           104/95 mmHg Patient Gender: F                HR:           71 bpm. Exam Location:  Inpatient Procedure: Limited  Echo Indications:     LVEF  History:         Patient has prior history of Echocardiogram examinations, most                  recent 01/25/2023. CAD; Risk Factors:Hypertension and Diabetes.  Sonographer:     Jayson Gaskins Referring Phys:  [id number] SUBRINA SUNDIL Diagnosing Phys: Dorn Ross MD IMPRESSIONS  1. Left ventricular ejection fraction, by estimation, is >75%. The left ventricle has hyperdynamic function. The left ventricle has no regional wall motion abnormalities.  2. The aortic valve is tricuspid. There is moderate calcification of the aortic valve. There is moderate thickening of the aortic valve.  3. IVC is small suggesting low RA pressure and hypovolemia  4. Limited echo to evaluate LV function FINDINGS  Left Ventricle: Left ventricular ejection fraction, by estimation, is >75%. The left ventricle has hyperdynamic function. The left ventricle has no regional wall motion abnormalities. Aortic Valve: The aortic valve is tricuspid. There is moderate calcification of the aortic valve. There is moderate thickening of the aortic valve. There is moderate aortic valve annular calcification. LEFT VENTRICLE PLAX 2D LVIDd:         4.00 cm LVIDs:         1.90 cm LV PW:         0.80 cm LV IVS:        0.90 cm LVOT diam:     1.80 cm LVOT Area:     2.54 cm  LEFT ATRIUM             Index LA Vol (A2C):   38.2 ml 23.04 ml/m LA Vol (A4C):   30.5 ml 18.40 ml/m LA Biplane Vol: 34.9 ml 21.05 ml/m   AORTA Ao Root diam: 2.90 cm Ao Asc diam:  3.50 cm  SHUNTS Systemic Diam: 1.80 cm Dorn Ross MD Electronically signed by Dorn Ross MD Signature Date/Time: 03/24/2023/2:22:13 PM    Final (Updated)    DG Chest Portable 1 View Result Date: 03/23/2023 CLINICAL DATA:  Chest pain, shortness of breath EXAM: PORTABLE CHEST 1 VIEW COMPARISON:  12/11/2022 FINDINGS: Heart and mediastinal contours are within normal limits. No focal opacities or effusions. No acute bony abnormality. Aortic atherosclerosis. IMPRESSION: No active  disease. Electronically Signed   By: Franky Crease M.D.   On: 03/23/2023 20:17    Scheduled Meds:  aspirin  EC  81 mg Oral Daily   clopidogrel   75 mg Oral Daily   docusate sodium   100 mg Oral  BID   ezetimibe   10 mg Oral Daily   FLUoxetine   40 mg Oral Daily   heparin   5,000 Units Subcutaneous Q8H   insulin  aspart  0-5 Units Subcutaneous QHS   insulin  aspart  0-6 Units Subcutaneous TID WC   isosorbide  mononitrate  60 mg Oral Daily   levothyroxine   50 mcg Oral Q0600   pantoprazole   20 mg Oral Daily   rOPINIRole   1 mg Oral QHS   rosuvastatin   20 mg Oral Daily   sodium chloride  flush  3 mL Intravenous Q12H   Continuous Infusions:     LOS: 0 days   Sigurd Pac, MD [facility] P1/07/2023, 10:42 AM

## 2023-03-25 NOTE — Progress Notes (Signed)
 Rounding Note    Patient Name: Valerie Ball Date of Encounter: 03/25/2023  [facility] Cardiologist: Alean SAUNDERS Madireddy, MD   Subjective   Chest pain improving but not resolved  Inpatient Medications    Scheduled Meds:  aspirin  EC  81 mg Oral Daily   clopidogrel   75 mg Oral Daily   docusate sodium   100 mg Oral BID   ezetimibe   10 mg Oral Daily   FLUoxetine   40 mg Oral Daily   heparin   5,000 Units Subcutaneous Q8H   insulin  aspart  0-5 Units Subcutaneous QHS   insulin  aspart  0-6 Units Subcutaneous TID WC   isosorbide  mononitrate  30 mg Oral Daily   levothyroxine   50 mcg Oral Q0600   pantoprazole   20 mg Oral Daily   rOPINIRole   1 mg Oral QHS   rosuvastatin   20 mg Oral Daily   sodium chloride  flush  3 mL Intravenous Q12H   Continuous Infusions:  PRN Meds: acetaminophen  **OR** acetaminophen , ondansetron  **OR** ondansetron  (ZOFRAN ) IV, sodium chloride  flush   Vital Signs    Vitals:   03/25/23 0011 03/25/23 0429 03/25/23 0510 03/25/23 0730  BP: 112/67 103/60  115/69  Pulse: 72 71  71  Resp: 20 18  16   Temp: 97.7 F (36.5 C) 97.8 F (36.6 C)  97.7 F (36.5 C)  TempSrc: Oral Oral  Oral  SpO2: 94% 93%  95%  Weight:   65.5 kg   Height:        Intake/Output Summary (Last 24 hours) at 03/25/2023 0757 Last data filed at 03/25/2023 9667 Gross per 24 hour  Intake 1926.27 ml  Output --  Net 1926.27 ml      03/25/2023    5:10 AM 03/23/2023    3:47 PM 03/23/2023    2:33 PM  Last 3 Weights  Weight (lbs) 144 lb 6.4 oz 143 lb 143 lb 4 oz  Weight (kg) 65.5 kg 64.864 kg 64.978 kg      Telemetry    NSR - Personally Reviewed  ECG    N/a - Personally Reviewed  Physical Exam   GEN: No acute distress.   Neck: No JVD Cardiac: RRR, no murmurs, rubs, or gallops.  Respiratory: Clear to auscultation bilaterally. GI: Soft, nontender, non-distended  MS: No edema; No deformity. Neuro:  Nonfocal  Psych: Normal affect   Labs    High Sensitivity Troponin:    Recent Labs  Lab 03/23/23 1718 03/23/23 1913 03/24/23 0034 03/24/23 0231 03/24/23 0430  TROPONINIHS 5 5 5 5 5      Chemistry Recent Labs  Lab 03/23/23 1718 03/24/23 0231 03/25/23 0247  NA 140 138 141  K 3.7 3.3* 3.6  CL 103 101 108  CO2 25 23 24   GLUCOSE 141* 129* 94  BUN 24* 24* 24*  CREATININE 1.37* 1.43* 1.41*  CALCIUM  9.7 8.9 8.6*  PROT  --  6.6  --   ALBUMIN  --  3.6  --   AST  --  34  --   ALT  --  23  --   ALKPHOS  --  47  --   BILITOT  --  1.2  --   GFRNONAA 40* 38* 39*  ANIONGAP 12 14 9     Lipids No results for input(s): CHOL, TRIG, HDL, LABVLDL, LDLCALC, CHOLHDL in the last 168 hours.  Hematology Recent Labs  Lab 03/23/23 1718 03/24/23 0231  WBC 8.2 7.6  RBC 5.24* 4.45  HGB 14.6 12.2  HCT 44.7 38.5  MCV 85.3 86.5  MCH 27.9 27.4  MCHC 32.7 31.7  RDW 15.2 15.5  PLT 166 128*   Thyroid  No results for input(s): TSH, FREET4 in the last 168 hours.  BNP Recent Labs  Lab 03/24/23 0034  BNP 43.0    DDimer No results for input(s): DDIMER in the last 168 hours.   Radiology    ECHOCARDIOGRAM LIMITED Result Date: 03/24/2023    ECHOCARDIOGRAM LIMITED REPORT   Patient Name:   VERNADINE COOMBS Date of Exam: 03/24/2023 Medical Rec #:  [id number]        Height:       62.0 in Accession #:    [id number]       Weight:       143.0 lb Date of Birth:  07-26-1945         BSA:          1.658 m Patient Age:    77 years         BP:           104/95 mmHg Patient Gender: F                HR:           71 bpm. Exam Location:  Inpatient Procedure: Limited Echo Indications:     LVEF  History:         Patient has prior history of Echocardiogram examinations, most                  recent 01/25/2023. CAD; Risk Factors:Hypertension and Diabetes.  Sonographer:     Jayson Gaskins Referring Phys:  [id number] SUBRINA SUNDIL Diagnosing Phys: Dorn Ross MD IMPRESSIONS  1. Left ventricular ejection fraction, by estimation, is >75%. The left ventricle has hyperdynamic function.  The left ventricle has no regional wall motion abnormalities.  2. The aortic valve is tricuspid. There is moderate calcification of the aortic valve. There is moderate thickening of the aortic valve.  3. IVC is small suggesting low RA pressure and hypovolemia  4. Limited echo to evaluate LV function FINDINGS  Left Ventricle: Left ventricular ejection fraction, by estimation, is >75%. The left ventricle has hyperdynamic function. The left ventricle has no regional wall motion abnormalities. Aortic Valve: The aortic valve is tricuspid. There is moderate calcification of the aortic valve. There is moderate thickening of the aortic valve. There is moderate aortic valve annular calcification. LEFT VENTRICLE PLAX 2D LVIDd:         4.00 cm LVIDs:         1.90 cm LV PW:         0.80 cm LV IVS:        0.90 cm LVOT diam:     1.80 cm LVOT Area:     2.54 cm  LEFT ATRIUM             Index LA Vol (A2C):   38.2 ml 23.04 ml/m LA Vol (A4C):   30.5 ml 18.40 ml/m LA Biplane Vol: 34.9 ml 21.05 ml/m   AORTA Ao Root diam: 2.90 cm Ao Asc diam:  3.50 cm  SHUNTS Systemic Diam: 1.80 cm Dorn Ross MD Electronically signed by Dorn Ross MD Signature Date/Time: 03/24/2023/2:22:13 PM    Final (Updated)    DG Chest Portable 1 View Result Date: 03/23/2023 CLINICAL DATA:  Chest pain, shortness of breath EXAM: PORTABLE CHEST 1 VIEW COMPARISON:  12/11/2022 FINDINGS: Heart and mediastinal contours are within normal limits. No focal opacities or effusions. No  acute bony abnormality. Aortic atherosclerosis. IMPRESSION: No active disease. Electronically Signed   By: Franky Crease M.D.   On: 03/23/2023 20:17    Cardiac Studies    Patient Profile       Assessment & Plan    1.Chest pain/History of CAD - prior history of PCI to LAD 03/08/2023.  - presented with chest pain - trops neg, EKG SR no ischemic changes - limited echo shows hyperdynamic LV without any WMAs   - no objective evidence of ischemia. Certaintly if acute issue  with recent LAD stent would have been more significant findings. - perhaps vasospasm or micorvasc disease.  - imdur  30mg  daily has been added - medical therapy with ASA, plavix  75, zetia  10, crestor  20  - chest pain improved to some degree with imdur . Resolved but reoccurred after walking back from bathroom. Increase imdur  to 60mg  daily and monitor symptoms     2. AKI - IVFs per primary team - poor oral hydration, small IVC on echo. Likely prerenal - home hydrochlorothiazide  and losartan  on hold.   3. HTN - some soft bp's on admission in setting of hypovolemia, home bp meds held  For questions or updates, please contact [facility] Please consult [email or web address] for contact info under        Signed, Alvan Carrier, MD  03/25/2023, 7:57 AM

## 2023-03-26 DIAGNOSIS — I25110 Atherosclerotic heart disease of native coronary artery with unstable angina pectoris: Secondary | ICD-10-CM | POA: Diagnosis not present

## 2023-03-26 DIAGNOSIS — I25118 Atherosclerotic heart disease of native coronary artery with other forms of angina pectoris: Secondary | ICD-10-CM | POA: Diagnosis not present

## 2023-03-26 DIAGNOSIS — I200 Unstable angina: Secondary | ICD-10-CM | POA: Diagnosis not present

## 2023-03-26 LAB — HEMOGLOBIN A1C
Hgb A1c MFr Bld: 6.2 % — ABNORMAL HIGH (ref 4.8–5.6)
Mean Plasma Glucose: 131 mg/dL

## 2023-03-26 LAB — BASIC METABOLIC PANEL
Anion gap: 11 (ref 5–15)
BUN: 23 mg/dL (ref 8–23)
CO2: 24 mmol/L (ref 22–32)
Calcium: 8.8 mg/dL — ABNORMAL LOW (ref 8.9–10.3)
Chloride: 105 mmol/L (ref 98–111)
Creatinine, Ser: 1.45 mg/dL — ABNORMAL HIGH (ref 0.44–1.00)
GFR, Estimated: 38 mL/min — ABNORMAL LOW (ref >60)
Glucose, Bld: 120 mg/dL — ABNORMAL HIGH (ref 70–99)
Potassium: 3.8 mmol/L (ref 3.5–5.1)
Sodium: 140 mmol/L (ref 135–145)

## 2023-03-26 LAB — GLUCOSE, CAPILLARY
Glucose-Capillary: 122 mg/dL — ABNORMAL HIGH (ref 70–99)
Glucose-Capillary: 141 mg/dL — ABNORMAL HIGH (ref 70–99)
Glucose-Capillary: 149 mg/dL — ABNORMAL HIGH (ref 70–99)
Glucose-Capillary: 127 mg/dL — ABNORMAL HIGH (ref 70–99)

## 2023-03-26 LAB — SEDIMENTATION RATE: Sed Rate: 15 mm/h (ref 0–22)

## 2023-03-26 LAB — C-REACTIVE PROTEIN: CRP: 1 mg/dL — ABNORMAL HIGH (ref <1.0)

## 2023-03-26 NOTE — Progress Notes (Signed)
 PROGRESS NOTE  Valerie Ball  FMW:[id number] DOB: [birth year] DOA: 03/23/2023 PCP: Randeen Laine LABOR, MD   Brief Narrative: Patient is a 78 year old female with history of coronary artery disease, status post PCI to LAD on 03/08/2023, hypertension, hyperlipidemia, hypothyroidism, mitral stenosis, NASH who presented with chest pain.  She was using sublingual hydration without relief.  Chest pain was mainly located on the left side, radiating to the left shoulder and back with associated shortness of breath.  Cardiology consulted .  Troponin negative, echo with hyperdynamic LV, no wall motion abnormalities  Subjective: -Continues to have intermittent chest discomfort, worse with activity  Assessment & Plan:  CAD, ongoing chest pain  -Recent PCI, DES to LAD on 12/19.   -Had nonobstructive CAD in other vessels  -Troponins negative, echo with hyperdynamic LV, no valve motion abnormalities  -Cards following, continue aspirin  Plavix  metoprolol , statin, Imdur  dose increased -Follow-up CRP, ESR, plan for left heart cath tomorrow if symptoms persist  AKI on  CKD stage II: ARB on hold  Hypokalemia: Supplemented  GAD: On Prozac   Restless leg syndrome : On ropinirole   Non-insulin -dependent diabetes type 2: On Trulicity , Jardiance , at baseline -continue sliding scale  History of hiatal hernia/gastroparesis/GERD: Continue PPI  History of hyperlipidemia: On Crestor , ezetimibe   History of hypothyroidism: On levothyroxine   Thrombocytopenia: Chronic, stable  DVT prophylaxis: hep SQ   Code Status: Full Code Family Communication: None present Disposition: Home pending improvement in symptoms, possibly tomorrow   Consultants: Cardiology  Procedures: None  Antimicrobials:  Anti-infectives (From admission, onward)    None       Objective: Vitals:   03/26/23 0438 03/26/23 0441 03/26/23 0719 03/26/23 1102  BP:   118/79 (!) 109/54  Pulse:   69 74  Resp:   20 20  Temp: 98.1 F (36.7 C)   97.8 F (36.6 C) 98.1 F (36.7 C)  TempSrc: Oral  Oral Oral  SpO2:   93% 96%  Weight:  64.6 kg    Height:        Intake/Output Summary (Last 24 hours) at 03/26/2023 1145 Last data filed at 03/26/2023 0902 Gross per 24 hour  Intake 477 ml  Output --  Net 477 ml   Filed Weights   03/23/23 1547 03/25/23 0510 03/26/23 0441  Weight: 64.9 kg 65.5 kg 64.6 kg    Examination: Gen: Awake, Alert, Oriented X 3,  HEENT: no JVD Lungs: Clear CVS: S1S2/RRR Abd: soft, Non tender, non distended, BS present Extremities: No edema Skin: no new rashes on exposed skin    Data Reviewed: I have personally reviewed following labs and imaging studies  CBC: Recent Labs  Lab 03/23/23 1718 03/24/23 0231  WBC 8.2 7.6  HGB 14.6 12.2  HCT 44.7 38.5  MCV 85.3 86.5  PLT 166 128*   Basic Metabolic Panel: Recent Labs  Lab 03/23/23 1501 03/23/23 1718 03/24/23 0231 03/25/23 0247 03/26/23 0219  NA  --  140 138 141 140  K  --  3.7 3.3* 3.6 3.8  CL  --  103 101 108 105  CO2  --  25 23 24 24   GLUCOSE 233* 141* 129* 94 120*  BUN  --  24* 24* 24* 23  CREATININE  --  1.37* 1.43* 1.41* 1.45*  CALCIUM   --  9.7 8.9 8.6* 8.8*     No results found for this or any previous visit (from the past 240 hours).   Radiology Studies: No results found.   Scheduled Meds:  aspirin  EC  81  mg Oral Daily   clopidogrel   75 mg Oral Daily   docusate sodium   100 mg Oral BID   ezetimibe   10 mg Oral Daily   FLUoxetine   40 mg Oral Daily   heparin   5,000 Units Subcutaneous Q8H   insulin  aspart  0-5 Units Subcutaneous QHS   insulin  aspart  0-6 Units Subcutaneous TID WC   isosorbide  mononitrate  60 mg Oral Daily   levothyroxine   50 mcg Oral Q0600   pantoprazole   20 mg Oral Daily   rOPINIRole   1 mg Oral QHS   rosuvastatin   20 mg Oral Daily   sodium chloride  flush  3 mL Intravenous Q12H   Continuous Infusions:     LOS: 0 days   Sigurd Pac, MD [facility] P1/08/2023, 11:45 AM

## 2023-03-26 NOTE — Progress Notes (Signed)
 Progress Note  Patient Name: Valerie Ball Date of Encounter: 03/26/2023 Primary Cardiologist: Alean SAUNDERS Madireddy, MD   Subjective   Overnight no events. Patient notes that she has a new HA Notes no real improvement in her sx on medical management. She feels this pain is similar to her prior angina.  Vital Signs    Vitals:   03/26/23 0039 03/26/23 0438 03/26/23 0441 03/26/23 0719  BP:    118/79  Pulse:    69  Resp:    20  Temp: 98.3 F (36.8 C) 98.1 F (36.7 C)  97.8 F (36.6 C)  TempSrc: Oral Oral  Oral  SpO2: 97%   93%  Weight:   64.6 kg   Height:        Intake/Output Summary (Last 24 hours) at 03/26/2023 0802 Last data filed at 03/25/2023 2100 Gross per 24 hour  Intake 480 ml  Output --  Net 480 ml   Filed Weights   03/23/23 1547 03/25/23 0510 03/26/23 0441  Weight: 64.9 kg 65.5 kg 64.6 kg    Physical Exam   GEN: No acute distress.   Neck: No JVD Cardiac: RRR, no murmurs, rubs, or gallops. R Radial +2  Respiratory: Clear to auscultation bilaterally. GI: Soft, nontender, non-distended  MS: No edema  Labs  EKG: SR inferior and anterior Q waves Telemetry: SR   Chemistry Recent Labs  Lab 03/24/23 0231 03/25/23 0247 03/26/23 0219  NA 138 141 140  K 3.3* 3.6 3.8  CL 101 108 105  CO2 23 24 24   GLUCOSE 129* 94 120*  BUN 24* 24* 23  CREATININE 1.43* 1.41* 1.45*  CALCIUM  8.9 8.6* 8.8*  PROT 6.6  --   --   ALBUMIN 3.6  --   --   AST 34  --   --   ALT 23  --   --   ALKPHOS 47  --   --   BILITOT 1.2  --   --   GFRNONAA 38* 39* 38*  ANIONGAP 14 9 11      Hematology Recent Labs  Lab 03/23/23 1718 03/24/23 0231  WBC 8.2 7.6  RBC 5.24* 4.45  HGB 14.6 12.2  HCT 44.7 38.5  MCV 85.3 86.5  MCH 27.9 27.4  MCHC 32.7 31.7  RDW 15.2 15.5  PLT 166 128*    Cardiac EnzymesNo results for input(s): TROPONINI in the last 168 hours. No results for input(s): TROPIPOC in the last 168 hours.   BNP Recent Labs  Lab 03/24/23 0034  BNP 43.0      DDimer No results for input(s): DDIMER in the last 168 hours.   Cardiac Studies   Cardiac Studies & Procedures   CARDIAC CATHETERIZATION  CARDIAC CATHETERIZATION 03/08/2023  Narrative   Ost RCA to Prox RCA lesion is 50% stenosed.   2nd Mrg lesion is 20% stenosed.   Mid Cx lesion is 20% stenosed.   Mid LAD-1 lesion is 30% stenosed.   Mid LAD-2 lesion is 50% stenosed.   Mid LAD-3 lesion is 99% stenosed.   A drug-eluting stent was successfully placed using a SYNERGY XD 2.25X16.   Post intervention, there is a 0% residual stenosis.  Severe mid LAD stenosis Successful PTCA/DES x 1 mid LAD Moderate stenosis ostial RCA. This does not appear to be flow limiting.  Recommendations: Continue DAPT with ASA and Plavix  for at least six months. Same day post PCI discharge.  Findings Coronary Findings Diagnostic  Dominance: Right  Left Anterior Descending Vessel  is large. Mid LAD-1 lesion is 30% stenosed. Mid LAD-2 lesion is 50% stenosed. Mid LAD-3 lesion is 99% stenosed.  Left Circumflex Vessel is large. Mid Cx lesion is 20% stenosed.  Second Obtuse Marginal Branch Vessel is moderate in size. 2nd Mrg lesion is 20% stenosed.  Right Coronary Artery Vessel is large. Ost RCA to Prox RCA lesion is 50% stenosed.  Intervention  Mid LAD-3 lesion Stent [place] GUIDE 6FR XBLAD3.5 guide catheter was inserted. Lesion crossed with guidewire using a WIRE ASAHI PROWATER 180CM. Pre-stent angioplasty was performed using a BALLN EMERGE MR 2.0X12. A drug-eluting stent was successfully placed using a SYNERGY XD 2.25X16. Stent strut is well apposed. Post-stent angioplasty was performed using a BALLN [place] EMERGE MR 2.5X12. Post-Intervention Lesion Assessment The intervention was successful. Pre-interventional TIMI flow is 3. Post-intervention TIMI flow is 3. No complications occurred at this lesion. There is a 0% residual stenosis post intervention.    ECHOCARDIOGRAM  ECHOCARDIOGRAM  LIMITED 03/24/2023  Narrative ECHOCARDIOGRAM LIMITED REPORT    Patient Name:   Valerie Ball Date of Exam: 03/24/2023 Medical Rec #:  [id number]        Height:       62.0 in Accession #:    [id number]       Weight:       143.0 lb Date of Birth:  [birth year]         BSA:          1.658 m Patient Age:    77 years         BP:           104/95 mmHg Patient Gender: F                HR:           71 bpm. Exam Location:  Inpatient  Procedure: Limited Echo  Indications:     LVEF  History:         Patient has prior history of Echocardiogram examinations, most recent 01/25/2023. CAD; Risk Factors:Hypertension and Diabetes.  Sonographer:     Jayson Gaskins Referring Phys:  [id number] SUBRINA SUNDIL Diagnosing Phys: Dorn Ross MD  IMPRESSIONS   1. Left ventricular ejection fraction, by estimation, is >75%. The left ventricle has hyperdynamic function. The left ventricle has no regional wall motion abnormalities. 2. The aortic valve is tricuspid. There is moderate calcification of the aortic valve. There is moderate thickening of the aortic valve. 3. IVC is small suggesting low RA pressure and hypovolemia 4. Limited echo to evaluate LV function  FINDINGS Left Ventricle: Left ventricular ejection fraction, by estimation, is >75%. The left ventricle has hyperdynamic function. The left ventricle has no regional wall motion abnormalities.  Aortic Valve: The aortic valve is tricuspid. There is moderate calcification of the aortic valve. There is moderate thickening of the aortic valve. There is moderate aortic valve annular calcification.  LEFT VENTRICLE PLAX 2D LVIDd:         4.00 cm LVIDs:         1.90 cm LV PW:         0.80 cm LV IVS:        0.90 cm LVOT diam:     1.80 cm LVOT Area:     2.54 cm   LEFT ATRIUM             Index LA Vol (A2C):   38.2 ml 23.04 ml/m LA Vol (A4C):   30.5 ml 18.40 ml/m LA Biplane Vol:  34.9 ml 21.05 ml/m  AORTA Ao Root diam: 2.90 cm Ao Asc diam:  3.50  cm   SHUNTS Systemic Diam: 1.80 cm  Dorn Ross MD Electronically signed by Dorn Ross MD Signature Date/Time: 03/24/2023/2:22:13 PM    Final (Updated)   MONITORS  LONG TERM MONITOR (3-14 DAYS) 01/26/2023  Narrative   Sinus rhythm predominantly with average heart rate 80/min, rare supraventricular and ventricular ectopy burden.   Short runs of supraventricular tachycardia appear consistent with ectopic atrial runs, longest episode lasted 8 beats.   Two patient triggered events in total, correlated with normal sinus rhythm and rate.   Overall benign study.   Patch Wear Time:  7 days and 16 hours (2024-10-17T16:34:29-0400 to 2024-10-25T08:46:09-398)  Predominantly sinus rhythm. Average heart rate 80/min [ranging from 60 bpm to 143 bpm]. Rare supraventricular ectopy, less than 1% burden Rare ventricular ectopy, less than 1% burden. Short runs of supraventricular tachycardia occurred with the longest episode lasting 8 beats with an average rate of 113 bpm, these appear consistent with ectopic atrial tachycardia. Total patient triggers were two and reported without any reported symptoms, correlated with sinus rhythm with normal heart rate ranging from 71 bpm to 81 bpm Overall benign study.  CT SCANS  CT CORONARY MORPH W/CTA COR W/SCORE 01/19/2023  Addendum 02/10/2023 10:06 PM ADDENDUM REPORT: 02/10/2023 22:04  EXAM: OVER-READ INTERPRETATION  CT CHEST  The following report is an over-read performed by radiologist Dr. Fonda Mom [facility] on 02/10/2023. This over-read does not include interpretation of cardiac or coronary anatomy or pathology. The coronary CTA interpretation by the cardiologist is attached.  COMPARISON:  07/01/2015.  FINDINGS: Cardiovascular:  See findings discussed in the body of the report.  Mediastinum/Nodes: No suspicious adenopathy identified. Imaged mediastinal structures are unremarkable.  Lungs/Pleura: Basilar  interstitial changes consistent with scarring or subsegmental atelectasis. No pleural effusion or pneumothorax.  Upper Abdomen: Small hiatal hernia.  Musculoskeletal: Bilateral mastectomy. No acute osseous findings. There are thoracic degenerative changes.  IMPRESSION: 1. Basilar scarring or subsegmental atelectasis. 2. Small hiatal hernia.   Electronically Signed By: Fonda Field M.D. On: 02/10/2023 22:04  Narrative CLINICAL DATA:  Chest pain  EXAM: Cardiac/Coronary CTA  TECHNIQUE: A non-contrast, gated CT scan was obtained with axial slices of 3 mm through the heart for calcium  scoring. Calcium  scoring was performed using the Agatston method. A 120 kV prospective, gated, contrast cardiac scan was obtained. Gantry rotation speed was 250 msecs and collimation was 0.6 mm. Two sublingual nitroglycerin  tablets (0.8 mg) were given. The 3D data set was reconstructed in 5% intervals of the 35-75% of the R-R cycle. Diastolic phases were analyzed on a dedicated workstation using MPR, MIP, and VRT modes. The patient received 95 cc of contrast.  FINDINGS: Image quality: Good.  Noise artifact is: Limited.  Coronary Arteries:  Normal coronary origin.  Right dominance.  Left main: The left main is a large caliber vessel with a normal take off from the left coronary cusp that bifurcates to form a left anterior descending artery and a left circumflex artery. There is no plaque or stenosis.  Left anterior descending artery: The LAD gives off 2 patent diagonal branches. There is mild calcified plaque in the proximal LAD with associated stenosis of 25-49%. There is mild calcified plaque in a large D1 with associated stenosis of 25-49%. There is moderate mixed plaque in the mid LAD with associated stenosis of 50-69%.  Left circumflex artery: The LCX is non-dominant and gives off 3 patent obtuse  marginal branches. There is mild mixed plaque in the proximal and mid LCx and OM3  with associated stenosis of 25-49%.  Right coronary artery: The RCA is dominant with normal take off from the right coronary cusp. The RCA terminates as a PDA and right posterolateral branch. There is mild soft plaque in the ostium of the RCA with associated stenosis of at least 25-49% but could be >50%. There is minimal calcified plaque in the distal RCA with associated stenosis of < 25%.  Right Atrium: Right atrial size is within normal limits.  Right Ventricle: The right ventricular cavity is within normal limits.  Left Atrium: Left atrial size is normal in size with no left atrial appendage filling defect.  Left Ventricle: The ventricular cavity size is within normal limits.  Pulmonary arteries: Normal in size.  Pulmonary veins: Normal pulmonary venous drainage.  Pericardium: Normal thickness without significant effusion or calcium  present.  Cardiac valves: The aortic valve is trileaflet with calcifications and with AV calcium  score of 174. The mitral valve is normal with mitral annular calcification.  Aorta: Normal caliber with scattered calcifications.  Extra-cardiac findings: See attached radiology report for non-cardiac structures.  IMPRESSION: 1. Coronary calcium  score of 411. This was 83rd percentile for age-, sex, and race-matched controls.  2. Total plaque volume 366 mm3 which is 63rd percentile for age- and sex-matched controls (calcified plaque 57mm3; non-calcified plaque 368mm3). TPV is moderate.  3. Normal coronary origin with right dominance.  4. Moderate atherosclerosis. 25-49% prox LAD/D1; 50-69% mid LAD; 25-49% ostial RCA; 25-49% prox LCx/OM3.  5. Consider symptom-guided anti-ischemic and preventive pharmacotherapy as well as risk factor modification per guideline-directed care.  6. This study has been submitted for FFR analysis of the ostial RCA.  RECOMMENDATIONS: 1. CAD-RADS 0: No evidence of CAD (0%). Consider non-atherosclerotic causes  of chest pain.  2. CAD-RADS 1: Minimal non-obstructive CAD (0-24%). Consider non-atherosclerotic causes of chest pain. Consider preventive therapy and risk factor modification.  3. CAD-RADS 2: Mild non-obstructive CAD (25-49%). Consider non-atherosclerotic causes of chest pain. Consider preventive therapy and risk factor modification.  4. CAD-RADS 3: Moderate stenosis. Consider symptom-guided anti-ischemic pharmacotherapy as well as risk factor modification per guideline directed care. Additional analysis with CT FFR will be submitted.  5. CAD-RADS 4: Severe stenosis. (70-99% or > 50% left main). Cardiac catheterization or CT FFR is recommended. Consider symptom-guided anti-ischemic pharmacotherapy as well as risk factor modification per guideline directed care. Invasive coronary angiography recommended with revascularization per published guideline statements.  6. CAD-RADS 5: Total coronary occlusion (100%). Consider cardiac catheterization or viability assessment. Consider symptom-guided anti-ischemic pharmacotherapy as well as risk factor modification per guideline directed care.  7. CAD-RADS N: Non-diagnostic study. Obstructive CAD can't be excluded. Alternative evaluation is recommended.  Wilbert Bihari, MD  Electronically Signed: By: Wilbert Bihari M.D. On: 01/19/2023 11:02           Assessment & Plan   Chest pain syndrome - prior history of PCI to LAD 02/2023, hx of GAD - with HLD on statin and zetia  - clinical syndrome not suggestive of stent thrombosis, continue DAPT - on Imdur  60 mg and heart rate ~ 70 - no evidence of MI  - we discussed that this pain may be non-cardiac; I will get a CRP and Sed rate today - if no evidence of post PCI pericarditis, will plan for [facility] tomorrow (unless worsening AKI) Risks and benefits of cardiac catheterization have been discussed with the patient.  These include bleeding, infection, kidney  damage, stroke, heart attack, death.   The patient understands these risks and is willing to proceed.  Discussed with [facility] team who is also amenable to plan.  Patient is now aware of her specific AKI risk.  Access recommendations: R radial Procedural considerations likely a diagnostic study    AKI vs CKD stage IIIb progression - with underlying DM and HTN - baseline GFR ~ 50, now has been ~ 38 - TRH leading care; I agree with holding home ARB and SGLT2i   For questions or updates, please contact [facility] Please consult [email or web address] for contact info under Cardiology/STEMI.      Stanly Leavens, MD FASE [facility] Cardiologist [facility]  [address] [place], [address] [place], [place] [address] [phone]  8:02 AM

## 2023-03-26 NOTE — TOC Initial Note (Signed)
 Transition of Care [facility]) - Initial/Assessment Note    Patient Details  Name: Valerie Ball MRN: [id number] Date of Birth: 16-Sep-1945  [facility]) CM/SW Contact:    Waddell Barnie Rama, RN Phone Number: 03/26/2023, 4:40 PM  Clinical Narrative:                 From home with spouse, has PCP and insurance on file, states has no HH services in place at this time or DME at home.  States family member will transport them home at [employer] and family is support system, [facility] [address] [facility] [place] Pta self ambulatory.   Expected Discharge Plan: Home/Self Care Barriers to Discharge: Continued Medical Work up   Patient Goals and CMS Choice Patient states their goals for this hospitalization and ongoing recovery are:: return home   Choice offered to / list presented to : NA      Expected Discharge Plan and Services In-house Referral: NA Discharge Planning Services: CM Consult Post Acute Care Choice: NA Living arrangements for the past 2 months: Single Family Home                 DME Arranged: N/A DME Agency: NA       HH Arranged: NA          Prior Living Arrangements/Services Living arrangements for the past 2 months: Single Family Home Lives with:: Spouse Patient language and need for interpreter reviewed:: Yes Do you feel safe going back to the place where you live?: Yes      Need for Family Participation in Patient Care: Yes (Comment) Care giver support system in place?: Yes (comment)   Criminal Activity/Legal Involvement Pertinent to Current Situation/Hospitalization: No - Comment as needed  Activities of Daily Living   ADL Screening (condition at time of admission) Independently performs ADLs?: Yes (appropriate for developmental age) Is the patient deaf or have difficulty hearing?: No Does the patient have difficulty seeing, even when wearing glasses/contacts?: No Does the patient have difficulty  concentrating, remembering, or making decisions?: Yes  Permission Sought/Granted Permission sought to share information with : Case Manager Permission granted to share information with : Yes, Verbal Permission Granted              Emotional Assessment Appearance:: Appears stated age Attitude/Demeanor/Rapport: Engaged Affect (typically observed): Appropriate Orientation: : Oriented to Self, Oriented to Place, Oriented to  Time, Oriented to Situation Alcohol / Substance Use: Not Applicable Psych Involvement: No (comment)  Admission diagnosis:  Other chest pain [R07.89] Unstable angina (HCC) [I20.0] Patient Active Problem List   Diagnosis Date Noted   Eye pain 03/23/2023   Unstable angina (HCC) 03/23/2023   History of CAD (coronary artery disease) 03/23/2023   Acute kidney injury superimposed on chronic kidney disease (HCC) 03/23/2023   Chronic idiopathic thrombocytopenia (HCC) 03/23/2023   Mitral stenosis and aortic incompetence, by TTE 01/25/2023 02/01/2023   CAD (coronary artery disease)  CT Cardiac 01/19/2023 calcium  score 411, plaque volume 366, moderate stenosis LAD, mild stenosis D1, RCA, LCx/OM 3; S/p PCI of Mid LAD 03/08/2023 02/01/2023   Poor balance 01/30/2023   Dyspnea on exertion 01/04/2023   Palpitations 01/04/2023   Allergy    Angiodysplasia of colon    Anxiety    Arthritis    Borderline diabetic    Colon polyp    Diverticulosis    GI bleed    Hepatic steatosis    Hiatal hernia  Hyperlipidemia    Hypertension    Macular edema    Menopause    Postmenopausal hormone therapy    Seizures (HCC)    Chest pain 12/29/2022   Bilateral knee pain 05/11/2022   Decreased calculated GFR 05/11/2022   Blurred vision, right eye 08/11/2021   History of COVID-19 09/24/2020   Fever, low grade 09/24/2020   Posterior vitreous detachment of right eye 11/06/2019   Cystoid macular edema of left eye 07/14/2019   Branch retinal vein occlusion with macular edema of left eye  07/14/2019   Hypertensive retinopathy of left eye 07/14/2019   Vitreous floaters of left eye 07/14/2019   Fatigue 05/20/2018   Hip pain, bilateral 08/31/2017   Vitamin B12 deficiency 09/21/2016   Diabetic gastroparesis (HCC) 03/27/2016   Retinal vein occlusion, branch 01/03/2016   Alternating constipation and diarrhea 08/12/2015   AVM (arteriovenous malformation) of colon 05/18/2015   IBS (irritable bowel syndrome) 02/28/2015   Diverticulitis of colon 10/14/2014   Gout 07/22/2014   Hypothyroidism 04/23/2014   Encounter for Medicare annual wellness exam 01/23/2014   Estrogen deficiency 01/23/2014   Colon cancer screening 01/23/2014   Tremor 01/20/2014   Dizziness 01/07/2014   Allergic rhinitis 01/07/2014   Thrombocytopenia (HCC) 11/25/2013   Diabetic neuropathy (HCC) 11/25/2013   History of removal of implants of both breasts 07/01/2013   Lap Nissen October 2013 01/19/2012   Large type III mixed hiatus hernia  12/06/2011   Stress reaction 10/25/2011   Routine general medical examination at a health care facility 07/11/2011   Non-insulin  treated type 2 diabetes mellitus (HCC) 07/25/2010   TRANSAMINASES, SERUM, ELEVATED 11/11/2009   History of breast cancer 08/02/2009   Breast cancer (HCC) 02/2009   ESOPHAGEAL STRICTURE 03/02/2008   Diaphragmatic hernia 03/02/2008   Peritoneal adhesions 03/02/2008   Anemia 02/07/2008   Melena 01/2008   Poorly controlled type 2 diabetes mellitus with neuropathy (HCC) 08/19/2007   GAD (generalized anxiety disorder) 04/15/2007   DISORDERS, ORGANIC SLEEP NEC 08/23/2006   SYNDROME, RESTLESS LEGS 08/23/2006   SYNDROME, CARPAL TUNNEL 08/23/2006   Hyperlipidemia associated with type 2 diabetes mellitus (HCC) 08/22/2006   Essential hypertension 08/22/2006   Allergic rhinitis 08/22/2006   Fatty liver 08/22/2006   History of seizure 08/22/2006   Colon stricture (HCC) 01/14/2003   Salmonella 05/2000   PCP:  Randeen Laine LABOR, MD Pharmacy:    [id number] [place] - [address] [place] [address] Phone: [phone] Fax: [phone]  [facility] [address] [place] [address] Phone: [phone] Fax: [phone]     Social Drivers of Health (SDOH) Social History: SDOH Screenings   Food Insecurity: No Food Insecurity (03/24/2023)  Housing: Low Risk  (03/24/2023)  Transportation Needs: No Transportation Needs (03/24/2023)  Utilities: Not At Risk (03/24/2023)  Alcohol Screen: Low Risk  (02/28/2023)  Depression (PHQ2-9): High Risk (02/28/2023)  Financial Resource Strain: Low Risk  (02/28/2023)  Physical Activity: Inactive (02/28/2023)  Social Connections: Socially Integrated (03/24/2023)  Stress: Stress Concern Present (02/28/2023)  Tobacco Use: Low Risk  (03/23/2023)  Health Literacy: Adequate Health Literacy (02/28/2023)   SDOH Interventions:     Readmission Risk Interventions     No data to display

## 2023-03-26 NOTE — Plan of Care (Signed)
 Care plan reviewed.

## 2023-03-26 NOTE — Interval H&P Note (Signed)
 History and Physical Interval Note:  03/26/2023 5:41 PM  Valerie Ball  has presented today for surgery, with the diagnosis of unstable angina.  The various methods of treatment have been discussed with the patient and family. After consideration of risks, benefits and other options for treatment, the patient has consented to  Procedure(s): LEFT HEART CATH AND CORONARY ANGIOGRAPHY (N/A) as a surgical intervention.  The patient's history has been reviewed, patient examined, no change in status, stable for surgery.  I have reviewed the patient's chart and labs.  Questions were answered to the patient's satisfaction.     Shyne Lehrke K Orhan Mayorga

## 2023-03-26 NOTE — Plan of Care (Signed)

## 2023-03-26 NOTE — H&P (View-Only) (Signed)
 Progress Note  Patient Name: Valerie Ball Date of Encounter: 03/26/2023 Primary Cardiologist: Alean SAUNDERS Madireddy, MD   Subjective   Overnight no events. Patient notes that she has a new HA Notes no real improvement in her sx on medical management. She feels this pain is similar to her prior angina.  Vital Signs    Vitals:   03/26/23 0039 03/26/23 0438 03/26/23 0441 03/26/23 0719  BP:    118/79  Pulse:    69  Resp:    20  Temp: 98.3 F (36.8 C) 98.1 F (36.7 C)  97.8 F (36.6 C)  TempSrc: Oral Oral  Oral  SpO2: 97%   93%  Weight:   64.6 kg   Height:        Intake/Output Summary (Last 24 hours) at 03/26/2023 0802 Last data filed at 03/25/2023 2100 Gross per 24 hour  Intake 480 ml  Output --  Net 480 ml   Filed Weights   03/23/23 1547 03/25/23 0510 03/26/23 0441  Weight: 64.9 kg 65.5 kg 64.6 kg    Physical Exam   GEN: No acute distress.   Neck: No JVD Cardiac: RRR, no murmurs, rubs, or gallops. R Radial +2  Respiratory: Clear to auscultation bilaterally. GI: Soft, nontender, non-distended  MS: No edema  Labs  EKG: SR inferior and anterior Q waves Telemetry: SR   Chemistry Recent Labs  Lab 03/24/23 0231 03/25/23 0247 03/26/23 0219  NA 138 141 140  K 3.3* 3.6 3.8  CL 101 108 105  CO2 23 24 24   GLUCOSE 129* 94 120*  BUN 24* 24* 23  CREATININE 1.43* 1.41* 1.45*  CALCIUM  8.9 8.6* 8.8*  PROT 6.6  --   --   ALBUMIN 3.6  --   --   AST 34  --   --   ALT 23  --   --   ALKPHOS 47  --   --   BILITOT 1.2  --   --   GFRNONAA 38* 39* 38*  ANIONGAP 14 9 11      Hematology Recent Labs  Lab 03/23/23 1718 03/24/23 0231  WBC 8.2 7.6  RBC 5.24* 4.45  HGB 14.6 12.2  HCT 44.7 38.5  MCV 85.3 86.5  MCH 27.9 27.4  MCHC 32.7 31.7  RDW 15.2 15.5  PLT 166 128*    Cardiac EnzymesNo results for input(s): TROPONINI in the last 168 hours. No results for input(s): TROPIPOC in the last 168 hours.   BNP Recent Labs  Lab 03/24/23 0034  BNP 43.0      DDimer No results for input(s): DDIMER in the last 168 hours.   Cardiac Studies   Cardiac Studies & Procedures   CARDIAC CATHETERIZATION  CARDIAC CATHETERIZATION 03/08/2023  Narrative   Ost RCA to Prox RCA lesion is 50% stenosed.   2nd Mrg lesion is 20% stenosed.   Mid Cx lesion is 20% stenosed.   Mid LAD-1 lesion is 30% stenosed.   Mid LAD-2 lesion is 50% stenosed.   Mid LAD-3 lesion is 99% stenosed.   A drug-eluting stent was successfully placed using a SYNERGY XD 2.25X16.   Post intervention, there is a 0% residual stenosis.  Severe mid LAD stenosis Successful PTCA/DES x 1 mid LAD Moderate stenosis ostial RCA. This does not appear to be flow limiting.  Recommendations: Continue DAPT with ASA and Plavix  for at least six months. Same day post PCI discharge.  Findings Coronary Findings Diagnostic  Dominance: Right  Left Anterior Descending Vessel  is large. Mid LAD-1 lesion is 30% stenosed. Mid LAD-2 lesion is 50% stenosed. Mid LAD-3 lesion is 99% stenosed.  Left Circumflex Vessel is large. Mid Cx lesion is 20% stenosed.  Second Obtuse Marginal Branch Vessel is moderate in size. 2nd Mrg lesion is 20% stenosed.  Right Coronary Artery Vessel is large. Ost RCA to Prox RCA lesion is 50% stenosed.  Intervention  Mid LAD-3 lesion Stent [place] GUIDE 6FR XBLAD3.5 guide catheter was inserted. Lesion crossed with guidewire using a WIRE ASAHI PROWATER 180CM. Pre-stent angioplasty was performed using a BALLN EMERGE MR 2.0X12. A drug-eluting stent was successfully placed using a SYNERGY XD 2.25X16. Stent strut is well apposed. Post-stent angioplasty was performed using a BALLN [place] EMERGE MR 2.5X12. Post-Intervention Lesion Assessment The intervention was successful. Pre-interventional TIMI flow is 3. Post-intervention TIMI flow is 3. No complications occurred at this lesion. There is a 0% residual stenosis post intervention.    ECHOCARDIOGRAM  ECHOCARDIOGRAM  LIMITED 03/24/2023  Narrative ECHOCARDIOGRAM LIMITED REPORT    Patient Name:   Valerie Ball Date of Exam: 03/24/2023 Medical Rec #:  [id number]        Height:       62.0 in Accession #:    [id number]       Weight:       143.0 lb Date of Birth:  [birth year]         BSA:          1.658 m Patient Age:    77 years         BP:           104/95 mmHg Patient Gender: F                HR:           71 bpm. Exam Location:  Inpatient  Procedure: Limited Echo  Indications:     LVEF  History:         Patient has prior history of Echocardiogram examinations, most recent 01/25/2023. CAD; Risk Factors:Hypertension and Diabetes.  Sonographer:     Jayson Gaskins Referring Phys:  [id number] SUBRINA SUNDIL Diagnosing Phys: Dorn Ross MD  IMPRESSIONS   1. Left ventricular ejection fraction, by estimation, is >75%. The left ventricle has hyperdynamic function. The left ventricle has no regional wall motion abnormalities. 2. The aortic valve is tricuspid. There is moderate calcification of the aortic valve. There is moderate thickening of the aortic valve. 3. IVC is small suggesting low RA pressure and hypovolemia 4. Limited echo to evaluate LV function  FINDINGS Left Ventricle: Left ventricular ejection fraction, by estimation, is >75%. The left ventricle has hyperdynamic function. The left ventricle has no regional wall motion abnormalities.  Aortic Valve: The aortic valve is tricuspid. There is moderate calcification of the aortic valve. There is moderate thickening of the aortic valve. There is moderate aortic valve annular calcification.  LEFT VENTRICLE PLAX 2D LVIDd:         4.00 cm LVIDs:         1.90 cm LV PW:         0.80 cm LV IVS:        0.90 cm LVOT diam:     1.80 cm LVOT Area:     2.54 cm   LEFT ATRIUM             Index LA Vol (A2C):   38.2 ml 23.04 ml/m LA Vol (A4C):   30.5 ml 18.40 ml/m LA Biplane Vol:  34.9 ml 21.05 ml/m  AORTA Ao Root diam: 2.90 cm Ao Asc diam:  3.50  cm   SHUNTS Systemic Diam: 1.80 cm  Dorn Ross MD Electronically signed by Dorn Ross MD Signature Date/Time: 03/24/2023/2:22:13 PM    Final (Updated)   MONITORS  LONG TERM MONITOR (3-14 DAYS) 01/26/2023  Narrative   Sinus rhythm predominantly with average heart rate 80/min, rare supraventricular and ventricular ectopy burden.   Short runs of supraventricular tachycardia appear consistent with ectopic atrial runs, longest episode lasted 8 beats.   Two patient triggered events in total, correlated with normal sinus rhythm and rate.   Overall benign study.   Patch Wear Time:  7 days and 16 hours (2024-10-17T16:34:29-0400 to 2024-10-25T08:46:09-398)  Predominantly sinus rhythm. Average heart rate 80/min [ranging from 60 bpm to 143 bpm]. Rare supraventricular ectopy, less than 1% burden Rare ventricular ectopy, less than 1% burden. Short runs of supraventricular tachycardia occurred with the longest episode lasting 8 beats with an average rate of 113 bpm, these appear consistent with ectopic atrial tachycardia. Total patient triggers were two and reported without any reported symptoms, correlated with sinus rhythm with normal heart rate ranging from 71 bpm to 81 bpm Overall benign study.  CT SCANS  CT CORONARY MORPH W/CTA COR W/SCORE 01/19/2023  Addendum 02/10/2023 10:06 PM ADDENDUM REPORT: 02/10/2023 22:04  EXAM: OVER-READ INTERPRETATION  CT CHEST  The following report is an over-read performed by radiologist Dr. Fonda Mom [facility] on 02/10/2023. This over-read does not include interpretation of cardiac or coronary anatomy or pathology. The coronary CTA interpretation by the cardiologist is attached.  COMPARISON:  07/01/2015.  FINDINGS: Cardiovascular:  See findings discussed in the body of the report.  Mediastinum/Nodes: No suspicious adenopathy identified. Imaged mediastinal structures are unremarkable.  Lungs/Pleura: Basilar  interstitial changes consistent with scarring or subsegmental atelectasis. No pleural effusion or pneumothorax.  Upper Abdomen: Small hiatal hernia.  Musculoskeletal: Bilateral mastectomy. No acute osseous findings. There are thoracic degenerative changes.  IMPRESSION: 1. Basilar scarring or subsegmental atelectasis. 2. Small hiatal hernia.   Electronically Signed By: Fonda Field M.D. On: 02/10/2023 22:04  Narrative CLINICAL DATA:  Chest pain  EXAM: Cardiac/Coronary CTA  TECHNIQUE: A non-contrast, gated CT scan was obtained with axial slices of 3 mm through the heart for calcium  scoring. Calcium  scoring was performed using the Agatston method. A 120 kV prospective, gated, contrast cardiac scan was obtained. Gantry rotation speed was 250 msecs and collimation was 0.6 mm. Two sublingual nitroglycerin  tablets (0.8 mg) were given. The 3D data set was reconstructed in 5% intervals of the 35-75% of the R-R cycle. Diastolic phases were analyzed on a dedicated workstation using MPR, MIP, and VRT modes. The patient received 95 cc of contrast.  FINDINGS: Image quality: Good.  Noise artifact is: Limited.  Coronary Arteries:  Normal coronary origin.  Right dominance.  Left main: The left main is a large caliber vessel with a normal take off from the left coronary cusp that bifurcates to form a left anterior descending artery and a left circumflex artery. There is no plaque or stenosis.  Left anterior descending artery: The LAD gives off 2 patent diagonal branches. There is mild calcified plaque in the proximal LAD with associated stenosis of 25-49%. There is mild calcified plaque in a large D1 with associated stenosis of 25-49%. There is moderate mixed plaque in the mid LAD with associated stenosis of 50-69%.  Left circumflex artery: The LCX is non-dominant and gives off 3 patent obtuse  marginal branches. There is mild mixed plaque in the proximal and mid LCx and OM3  with associated stenosis of 25-49%.  Right coronary artery: The RCA is dominant with normal take off from the right coronary cusp. The RCA terminates as a PDA and right posterolateral branch. There is mild soft plaque in the ostium of the RCA with associated stenosis of at least 25-49% but could be >50%. There is minimal calcified plaque in the distal RCA with associated stenosis of < 25%.  Right Atrium: Right atrial size is within normal limits.  Right Ventricle: The right ventricular cavity is within normal limits.  Left Atrium: Left atrial size is normal in size with no left atrial appendage filling defect.  Left Ventricle: The ventricular cavity size is within normal limits.  Pulmonary arteries: Normal in size.  Pulmonary veins: Normal pulmonary venous drainage.  Pericardium: Normal thickness without significant effusion or calcium  present.  Cardiac valves: The aortic valve is trileaflet with calcifications and with AV calcium  score of 174. The mitral valve is normal with mitral annular calcification.  Aorta: Normal caliber with scattered calcifications.  Extra-cardiac findings: See attached radiology report for non-cardiac structures.  IMPRESSION: 1. Coronary calcium  score of 411. This was 83rd percentile for age-, sex, and race-matched controls.  2. Total plaque volume 366 mm3 which is 63rd percentile for age- and sex-matched controls (calcified plaque 57mm3; non-calcified plaque 368mm3). TPV is moderate.  3. Normal coronary origin with right dominance.  4. Moderate atherosclerosis. 25-49% prox LAD/D1; 50-69% mid LAD; 25-49% ostial RCA; 25-49% prox LCx/OM3.  5. Consider symptom-guided anti-ischemic and preventive pharmacotherapy as well as risk factor modification per guideline-directed care.  6. This study has been submitted for FFR analysis of the ostial RCA.  RECOMMENDATIONS: 1. CAD-RADS 0: No evidence of CAD (0%). Consider non-atherosclerotic causes  of chest pain.  2. CAD-RADS 1: Minimal non-obstructive CAD (0-24%). Consider non-atherosclerotic causes of chest pain. Consider preventive therapy and risk factor modification.  3. CAD-RADS 2: Mild non-obstructive CAD (25-49%). Consider non-atherosclerotic causes of chest pain. Consider preventive therapy and risk factor modification.  4. CAD-RADS 3: Moderate stenosis. Consider symptom-guided anti-ischemic pharmacotherapy as well as risk factor modification per guideline directed care. Additional analysis with CT FFR will be submitted.  5. CAD-RADS 4: Severe stenosis. (70-99% or > 50% left main). Cardiac catheterization or CT FFR is recommended. Consider symptom-guided anti-ischemic pharmacotherapy as well as risk factor modification per guideline directed care. Invasive coronary angiography recommended with revascularization per published guideline statements.  6. CAD-RADS 5: Total coronary occlusion (100%). Consider cardiac catheterization or viability assessment. Consider symptom-guided anti-ischemic pharmacotherapy as well as risk factor modification per guideline directed care.  7. CAD-RADS N: Non-diagnostic study. Obstructive CAD can't be excluded. Alternative evaluation is recommended.  Wilbert Bihari, MD  Electronically Signed: By: Wilbert Bihari M.D. On: 01/19/2023 11:02           Assessment & Plan   Chest pain syndrome - prior history of PCI to LAD 02/2023, hx of GAD - with HLD on statin and zetia  - clinical syndrome not suggestive of stent thrombosis, continue DAPT - on Imdur  60 mg and heart rate ~ 70 - no evidence of MI  - we discussed that this pain may be non-cardiac; I will get a CRP and Sed rate today - if no evidence of post PCI pericarditis, will plan for [facility] tomorrow (unless worsening AKI) Risks and benefits of cardiac catheterization have been discussed with the patient.  These include bleeding, infection, kidney  damage, stroke, heart attack, death.   The patient understands these risks and is willing to proceed.  Discussed with [facility] team who is also amenable to plan.  Patient is now aware of her specific AKI risk.  Access recommendations: R radial Procedural considerations likely a diagnostic study    AKI vs CKD stage IIIb progression - with underlying DM and HTN - baseline GFR ~ 50, now has been ~ 38 - [facility] leading care; I agree with holding home ARB and SGLT2i   For questions or updates, please contact [facility] Please consult [email or web address] for contact info under Cardiology/STEMI.      Stanly Leavens, MD FASE [facility] Cardiologist [facility]  [address] [place], [address] [place], [place] [address] [phone]  8:02 AM

## 2023-03-27 ENCOUNTER — Encounter (HOSPITAL_COMMUNITY)
Admission: EM | Disposition: A | Discharge status: Home / Self Care | Payer: Self-pay | Source: Home / Self Care | Attending: Emergency Medicine

## 2023-03-27 ENCOUNTER — Other Ambulatory Visit (HOSPITAL_COMMUNITY): Payer: Self-pay

## 2023-03-27 DIAGNOSIS — K449 Diaphragmatic hernia without obstruction or gangrene: Secondary | ICD-10-CM | POA: Diagnosis not present

## 2023-03-27 DIAGNOSIS — I129 Hypertensive chronic kidney disease with stage 1 through stage 4 chronic kidney disease, or unspecified chronic kidney disease: Secondary | ICD-10-CM | POA: Diagnosis not present

## 2023-03-27 DIAGNOSIS — I25118 Atherosclerotic heart disease of native coronary artery with other forms of angina pectoris: Secondary | ICD-10-CM | POA: Diagnosis not present

## 2023-03-27 DIAGNOSIS — Z8616 Personal history of COVID-19: Secondary | ICD-10-CM | POA: Diagnosis not present

## 2023-03-27 DIAGNOSIS — N179 Acute kidney failure, unspecified: Secondary | ICD-10-CM | POA: Diagnosis not present

## 2023-03-27 DIAGNOSIS — D696 Thrombocytopenia, unspecified: Secondary | ICD-10-CM | POA: Diagnosis not present

## 2023-03-27 DIAGNOSIS — I200 Unstable angina: Secondary | ICD-10-CM | POA: Diagnosis not present

## 2023-03-27 DIAGNOSIS — E785 Hyperlipidemia, unspecified: Secondary | ICD-10-CM | POA: Diagnosis not present

## 2023-03-27 DIAGNOSIS — I2510 Atherosclerotic heart disease of native coronary artery without angina pectoris: Secondary | ICD-10-CM | POA: Diagnosis not present

## 2023-03-27 DIAGNOSIS — K589 Irritable bowel syndrome without diarrhea: Secondary | ICD-10-CM | POA: Diagnosis not present

## 2023-03-27 DIAGNOSIS — E1122 Type 2 diabetes mellitus with diabetic chronic kidney disease: Secondary | ICD-10-CM | POA: Diagnosis not present

## 2023-03-27 DIAGNOSIS — N189 Chronic kidney disease, unspecified: Secondary | ICD-10-CM | POA: Diagnosis not present

## 2023-03-27 DIAGNOSIS — Z853 Personal history of malignant neoplasm of breast: Secondary | ICD-10-CM | POA: Diagnosis not present

## 2023-03-27 DIAGNOSIS — E039 Hypothyroidism, unspecified: Secondary | ICD-10-CM | POA: Diagnosis not present

## 2023-03-27 DIAGNOSIS — I25110 Atherosclerotic heart disease of native coronary artery with unstable angina pectoris: Secondary | ICD-10-CM | POA: Diagnosis not present

## 2023-03-27 HISTORY — PX: CORONARY PRESSURE/FFR STUDY: CATH118243

## 2023-03-27 HISTORY — PX: LEFT HEART CATH AND CORONARY ANGIOGRAPHY: CATH118249

## 2023-03-27 LAB — BASIC METABOLIC PANEL
Anion gap: 10 (ref 5–15)
BUN: 22 mg/dL (ref 8–23)
CO2: 25 mmol/L (ref 22–32)
Calcium: 8.9 mg/dL (ref 8.9–10.3)
Chloride: 104 mmol/L (ref 98–111)
Creatinine, Ser: 1.29 mg/dL — ABNORMAL HIGH (ref 0.44–1.00)
GFR, Estimated: 43 mL/min — ABNORMAL LOW (ref >60)
Glucose, Bld: 108 mg/dL — ABNORMAL HIGH (ref 70–99)
Potassium: 3.8 mmol/L (ref 3.5–5.1)
Sodium: 139 mmol/L (ref 135–145)

## 2023-03-27 LAB — GLUCOSE, CAPILLARY
Glucose-Capillary: 107 mg/dL — ABNORMAL HIGH (ref 70–99)
Glucose-Capillary: 117 mg/dL — ABNORMAL HIGH (ref 70–99)

## 2023-03-27 LAB — POCT ACTIVATED CLOTTING TIME: Activated Clotting Time: 245 s

## 2023-03-27 SURGERY — LEFT HEART CATH AND CORONARY ANGIOGRAPHY
Anesthesia: LOCAL

## 2023-03-27 MED ORDER — FENTANYL CITRATE (PF) 100 MCG/2ML IJ SOLN
INTRAMUSCULAR | Status: DC | PRN
  Administered 2023-03-27 (×3): 25 ug via INTRAVENOUS

## 2023-03-27 MED ORDER — LABETALOL HCL 5 MG/ML IV SOLN
10.0000 mg | INTRAVENOUS | Status: DC | PRN
Start: 2023-03-27 — End: 2023-03-27

## 2023-03-27 MED ORDER — LIDOCAINE HCL (PF) 1 % IJ SOLN
INTRAMUSCULAR | Status: DC | PRN
  Administered 2023-03-27: 2 mL via INTRADERMAL

## 2023-03-27 MED ORDER — CARVEDILOL 3.125 MG PO TABS
3.1250 mg | ORAL_TABLET | Freq: Two times a day (BID) | ORAL | 1 refills | Status: DC
  Filled 2023-03-27: qty 60, 30d supply, fill #0

## 2023-03-27 MED ORDER — ISOSORBIDE MONONITRATE ER 30 MG PO TB24
30.0000 mg | ORAL_TABLET | Freq: Every day | ORAL | 1 refills | Status: DC
  Filled 2023-03-27: qty 30, 30d supply, fill #0

## 2023-03-27 MED ORDER — ACETAMINOPHEN 325 MG PO TABS: 650.0000 mg | ORAL_TABLET | ORAL | Status: DC | PRN

## 2023-03-27 MED ORDER — HEPARIN SODIUM (PORCINE) 1000 UNIT/ML IJ SOLN
INTRAMUSCULAR | Status: DC | PRN
  Administered 2023-03-27: 2000 [IU] via INTRA_ARTERIAL
  Administered 2023-03-27: 1000 [IU] via INTRA_ARTERIAL
  Administered 2023-03-27: 5000 [IU] via INTRA_ARTERIAL

## 2023-03-27 MED ORDER — SODIUM CHLORIDE 0.9 % WEIGHT BASED INFUSION: 1.0000 mL/kg/h | INTRAVENOUS | Status: DC

## 2023-03-27 MED ORDER — SODIUM CHLORIDE 0.9 % IV SOLN: 250.0000 mL | INTRAVENOUS | Status: DC | PRN

## 2023-03-27 MED ORDER — LOSARTAN POTASSIUM 100 MG PO TABS: 50.0000 mg | ORAL_TABLET | Freq: Every day | ORAL | Status: DC

## 2023-03-27 MED ORDER — ADENOSINE (DIAGNOSTIC) 140MCG/KG/MIN
INTRAVENOUS | Status: AC | PRN
  Administered 2023-03-27: 140 ug/kg/min via INTRAVENOUS

## 2023-03-27 MED ORDER — IOHEXOL 350 MG/ML SOLN
INTRAVENOUS | Status: DC | PRN
  Administered 2023-03-27: 120 mL

## 2023-03-27 MED ORDER — SODIUM CHLORIDE 0.9% FLUSH: 3.0000 mL | Freq: Two times a day (BID) | INTRAVENOUS | Status: DC

## 2023-03-27 MED ORDER — MIDAZOLAM HCL 2 MG/2ML IJ SOLN
INTRAMUSCULAR | Status: AC
Start: 2023-03-27 — End: ?
  Filled 2023-03-27: qty 2

## 2023-03-27 MED ORDER — HEPARIN (PORCINE) IN NACL 1000-0.9 UT/500ML-% IV SOLN
INTRAVENOUS | Status: DC | PRN
  Administered 2023-03-27 (×2): 500 mL

## 2023-03-27 MED ORDER — ISOSORBIDE MONONITRATE ER 30 MG PO TB24
30.0000 mg | ORAL_TABLET | Freq: Every day | ORAL | Status: DC
  Administered 2023-03-27: 30 mg via ORAL

## 2023-03-27 MED ORDER — CARVEDILOL 3.125 MG PO TABS
3.1250 mg | ORAL_TABLET | Freq: Two times a day (BID) | ORAL | Status: DC
Start: 2023-03-27 — End: 2023-03-27

## 2023-03-27 MED ORDER — ADENOSINE 12 MG/4ML IV SOLN
INTRAVENOUS | Status: AC
  Filled 2023-03-27: qty 16

## 2023-03-27 MED ORDER — HEPARIN SODIUM (PORCINE) 1000 UNIT/ML IJ SOLN
INTRAMUSCULAR | Status: AC
  Filled 2023-03-27: qty 10

## 2023-03-27 MED ORDER — SODIUM CHLORIDE 0.9 % IV SOLN: INTRAVENOUS | Status: DC

## 2023-03-27 MED ORDER — FENTANYL CITRATE (PF) 100 MCG/2ML IJ SOLN
INTRAMUSCULAR | Status: AC
  Filled 2023-03-27: qty 2

## 2023-03-27 MED ORDER — SODIUM CHLORIDE 0.9% FLUSH: 3.0000 mL | INTRAVENOUS | Status: DC | PRN

## 2023-03-27 MED ORDER — VERAPAMIL HCL 2.5 MG/ML IV SOLN
INTRAVENOUS | Status: DC | PRN
  Administered 2023-03-27: 10 mL via INTRA_ARTERIAL

## 2023-03-27 MED ORDER — SODIUM CHLORIDE 0.9 % WEIGHT BASED INFUSION
3.0000 mL/kg/h | INTRAVENOUS | Status: DC
  Administered 2023-03-27: 3 mL/kg/h via INTRAVENOUS

## 2023-03-27 MED ORDER — HYDRALAZINE HCL 20 MG/ML IJ SOLN: 10.0000 mg | INTRAMUSCULAR | Status: DC | PRN

## 2023-03-27 MED ORDER — MIDAZOLAM HCL 2 MG/2ML IJ SOLN
INTRAMUSCULAR | Status: DC | PRN
  Administered 2023-03-27 (×2): 1 mg via INTRAVENOUS

## 2023-03-27 MED ORDER — LIDOCAINE HCL (PF) 1 % IJ SOLN
INTRAMUSCULAR | Status: AC
  Filled 2023-03-27: qty 30

## 2023-03-27 MED ORDER — VERAPAMIL HCL 2.5 MG/ML IV SOLN
INTRAVENOUS | Status: AC
Start: 2023-03-27 — End: ?
  Filled 2023-03-27: qty 2

## 2023-03-27 SURGICAL SUPPLY — 18 items
CATH GUIDELINER COAST (CATHETERS) IMPLANT
CATH INFINITI 5FR ANG PIGTAIL (CATHETERS) IMPLANT
CATH INFINITI AMBI 6FR TG (CATHETERS) IMPLANT
CATH INFINITI JR4 5F (CATHETERS) IMPLANT
CATH LAUNCHER 6FR JR4 (CATHETERS) IMPLANT
DEVICE RAD COMP TR BAND LRG (VASCULAR PRODUCTS) IMPLANT
ELECT DEFIB PAD ADLT CADENCE (PAD) IMPLANT
GLIDESHEATH SLEND SS 6F .021 (SHEATH) IMPLANT
GUIDEWIRE INQWIRE 1.5J.035X260 (WIRE) IMPLANT
GUIDEWIRE PRESSURE X 175 (WIRE) IMPLANT
INQWIRE 1.5J .035X260CM (WIRE) ×1
KIT ESSENTIALS PG (KITS) IMPLANT
KIT HEMO VALVE WATCHDOG (MISCELLANEOUS) IMPLANT
PACK CARDIAC CATHETERIZATION (CUSTOM PROCEDURE TRAY) ×1 IMPLANT
SET ATX-X65L (MISCELLANEOUS) IMPLANT
SHEATH 6FR 75 DEST SLENDER (SHEATH) IMPLANT
WIRE ASAHI PROWATER 300CM (WIRE) IMPLANT
WIRE HI TORQ VERSACORE 300 (WIRE) IMPLANT

## 2023-03-27 NOTE — Plan of Care (Signed)
  Problem: Education: Goal: Ability to describe self-care measures that may prevent or decrease complications (Diabetes Survival Skills Education) will improve Outcome: Adequate for Discharge Goal: Individualized Educational Video(s) Outcome: Adequate for Discharge   Problem: Coping: Goal: Ability to adjust to condition or change in health will improve Outcome: Adequate for Discharge   Problem: Fluid Volume: Goal: Ability to maintain a balanced intake and output will improve Outcome: Adequate for Discharge   Problem: Health Behavior/Discharge Planning: Goal: Ability to identify and utilize available resources and services will improve Outcome: Adequate for Discharge Goal: Ability to manage health-related needs will improve Outcome: Adequate for Discharge   Problem: Metabolic: Goal: Ability to maintain appropriate glucose levels will improve Outcome: Adequate for Discharge   Problem: Nutritional: Goal: Maintenance of adequate nutrition will improve Outcome: Adequate for Discharge Goal: Progress toward achieving an optimal weight will improve Outcome: Adequate for Discharge   Problem: Skin Integrity: Goal: Risk for impaired skin integrity will decrease Outcome: Adequate for Discharge   Problem: Tissue Perfusion: Goal: Adequacy of tissue perfusion will improve Outcome: Adequate for Discharge   Problem: Education: Goal: Knowledge of General Education information will improve Description: Including pain rating scale, medication(s)/side effects and non-pharmacologic comfort measures Outcome: Adequate for Discharge   Problem: Health Behavior/Discharge Planning: Goal: Ability to manage health-related needs will improve Outcome: Adequate for Discharge   Problem: Clinical Measurements: Goal: Ability to maintain clinical measurements within normal limits will improve Outcome: Adequate for Discharge Goal: Will remain free from infection Outcome: Adequate for Discharge Goal:  Diagnostic test results will improve Outcome: Adequate for Discharge Goal: Respiratory complications will improve Outcome: Adequate for Discharge Goal: Cardiovascular complication will be avoided Outcome: Adequate for Discharge   Problem: Activity: Goal: Risk for activity intolerance will decrease Outcome: Adequate for Discharge   Problem: Nutrition: Goal: Adequate nutrition will be maintained Outcome: Adequate for Discharge   Problem: Coping: Goal: Level of anxiety will decrease Outcome: Adequate for Discharge   Problem: Elimination: Goal: Will not experience complications related to bowel motility Outcome: Adequate for Discharge Goal: Will not experience complications related to urinary retention Outcome: Adequate for Discharge   Problem: Pain Management: Goal: General experience of comfort will improve Outcome: Adequate for Discharge   Problem: Safety: Goal: Ability to remain free from injury will improve Outcome: Adequate for Discharge   Problem: Skin Integrity: Goal: Risk for impaired skin integrity will decrease Outcome: Adequate for Discharge   Problem: Education: Goal: Understanding of CV disease, CV risk reduction, and recovery process will improve Outcome: Adequate for Discharge Goal: Individualized Educational Video(s) Outcome: Adequate for Discharge   Problem: Activity: Goal: Ability to return to baseline activity level will improve Outcome: Adequate for Discharge   Problem: Cardiovascular: Goal: Ability to achieve and maintain adequate cardiovascular perfusion will improve Outcome: Adequate for Discharge Goal: Vascular access site(s) Level 0-1 will be maintained Outcome: Adequate for Discharge   Problem: Health Behavior/Discharge Planning: Goal: Ability to safely manage health-related needs after discharge will improve Outcome: Adequate for Discharge

## 2023-03-27 NOTE — Plan of Care (Signed)

## 2023-03-27 NOTE — TOC Transition Note (Signed)
 Transition of Care [employer]) - Discharge Note   Patient Details  Name: Valerie Ball MRN: [id number] Date of Birth: September 19, 1945  [facility] CM/SW Contact:  Waddell Barnie Rama, RN Phone Number: 03/27/2023, 12:03 PM   Clinical Narrative:    For dc today, has no needs, has transportation, [facility] to fill meds.     Barriers to Discharge: Continued Medical Work up   Patient Goals and CMS Choice Patient states their goals for this hospitalization and ongoing recovery are:: return home   Choice offered to / list presented to : NA      Discharge Placement                       Discharge Plan and Services Additional resources added to the After Visit Summary for   In-house Referral: NA Discharge Planning Services: CM Consult Post Acute Care Choice: NA          DME Arranged: N/A DME Agency: NA       HH Arranged: NA          Social Drivers of Health (SDOH) Interventions SDOH Screenings   Food Insecurity: No Food Insecurity (03/24/2023)  Housing: Low Risk  (03/24/2023)  Transportation Needs: No Transportation Needs (03/24/2023)  Utilities: Not At Risk (03/24/2023)  Alcohol Screen: Low Risk  (02/28/2023)  Depression (PHQ2-9): High Risk (02/28/2023)  Financial Resource Strain: Low Risk  (02/28/2023)  Physical Activity: Inactive (02/28/2023)  Social Connections: Socially Integrated (03/24/2023)  Stress: Stress Concern Present (02/28/2023)  Tobacco Use: Low Risk  (03/23/2023)  Health Literacy: Adequate Health Literacy (02/28/2023)     Readmission Risk Interventions     No data to display

## 2023-03-27 NOTE — Discharge Summary (Signed)
 Physician Discharge Summary  Valerie Ball FMW:[id number] DOB: [birth year] DOA: 03/23/2023  PCP: Randeen Laine LABOR, MD  Admit date: 03/23/2023 Discharge date: 03/27/2023  Time spent: 45 minutes  Recommendations for Outpatient Follow-up:  Cardiology Dr. Liborio on 1/20   Discharge Diagnoses:  Principal Problem:   Unstable angina [facility]) Active Problems:   History of CAD (coronary artery disease)   Acute kidney injury superimposed on chronic kidney disease (HCC)   Essential hypertension   Non-insulin  treated type 2 diabetes mellitus (HCC)   Hypothyroidism   IBS (irritable bowel syndrome)   Hiatal hernia   Hyperlipidemia   Chronic idiopathic thrombocytopenia (HCC)   Discharge Condition: Improved  Diet recommendation: Diabetic, healthy  Filed Weights   03/25/23 0510 03/26/23 0441 03/27/23 0458  Weight: 65.5 kg 64.6 kg 63.9 kg    History of present illness:  78 year old female with history of coronary artery disease, status post PCI to LAD on 03/08/2023, hypertension, hyperlipidemia, hypothyroidism, mitral stenosis, NASH who presented with chest pain. She was using sublingual hydration without relief. Chest pain was mainly located on the left side, radiating to the left shoulder and back with associated shortness of breath. Cardiology consulted . Troponin negative, echo with hyperdynamic LV, no wall motion abnormalities   Hospital Course:   CAD Persistent chest pain  03/18/23 underwent PTCA and stenting of mid LAD  -Readmitted with persistent chest pain worse on exertion, troponins negative, echo noted preserved EF with no wall motion abnormality, no pericardial effusion -Repeat cath today noted patent mid LAD stent, mild proximal and mid disease unchanged from prior, 50% stenosis in ostial RCA RFR was 0.99, coronary microvascular dysfunction noted, medical therapy and anti-antianginal therapy recommended -Imdur  dose decreased to 30 Mg, started on carvedilol , continued aspirin ,  Plavix , Crestor , Zetia  Follow-up with [facility]  AKI on  CKD stage II:  -Improved, resume losartan  at lower dose tomorrow   Hypokalemia: Supplemented   GAD: On Prozac    Restless leg syndrome : On ropinirole    Non-insulin -dependent diabetes type 2:  -On Trulicity , Jardiance , at baseline   History of hiatal hernia/gastroparesis/GERD: Continue PPI   History of hyperlipidemia: On Crestor , ezetimibe    History of hypothyroidism: On levothyroxine    Thrombocytopenia: Chronic, stable  Discharge Exam: Vitals:   03/27/23 0906 03/27/23 0935  BP: 139/68 102/61  Pulse: 74 69  Resp: 15 17  Temp:  (!) 97.5 F (36.4 C)  SpO2: 95% (!) 85%   Gen: Awake, Alert, Oriented X 3,  HEENT: no JVD Lungs: Good air movement bilaterally, CTAB CVS: S1S2/RRR Abd: soft, Non tender, non distended, BS present Extremities: No edema Skin: no new rashes on exposed skin   Discharge Instructions   Discharge Instructions     Diet - low sodium heart healthy   Complete by: As directed    Increase activity slowly   Complete by: As directed       Allergies as of 03/27/2023       Reactions   Arimidex [anastrozole] Other (See Comments)   Joint pain   Glipizide  Other (See Comments)   Hypoglycemia   Metformin  And Related Other (See Comments)   diarrhea   Penicillins Hives, Rash        Medication List     STOP taking these medications    amLODipine  2.5 MG tablet Commonly known as: NORVASC        TAKE these medications    aspirin  EC 81 MG tablet Take 1 tablet (81 mg total) by mouth daily. Swallow whole.  B-12 1000 MCG Tabs Take 1,000 mcg by mouth daily.   CALCIUM  500 PO Take 500 mg by mouth daily.   carvedilol  3.125 MG tablet Commonly known as: COREG  Take 1 tablet (3.125 mg total) by mouth 2 (two) times daily with a meal.   clopidogrel  75 MG tablet Commonly known as: Plavix  Take 1 tablet (75 mg total) by mouth daily.   empagliflozin  25 MG Tabs tablet Commonly known  as: Jardiance  Take 1 tablet (25 mg total) by mouth daily before breakfast.   ezetimibe  10 MG tablet Commonly known as: ZETIA  TAKE 1 TABLET DAILY   famotidine  20 MG tablet Commonly known as: PEPCID  Take 1 tablet (20 mg total) by mouth 2 (two) times daily.   FLUoxetine  40 MG capsule Commonly known as: PROZAC  Take 1 capsule (40 mg total) by mouth daily.   fluticasone  50 MCG/ACT nasal spray Commonly known as: FLONASE  USE 2 SPRAYS IN EACH NOSTRIL DAILY AS NEEDED FOR ALLERGIES   hydrochlorothiazide  25 MG tablet Commonly known as: HYDRODIURIL  TAKE 1 TABLET DAILY   isosorbide  mononitrate 30 MG 24 hr tablet Commonly known as: IMDUR  Take 1 tablet (30 mg total) by mouth daily. Start taking on: March 28, 2023   levothyroxine  50 MCG tablet Commonly known as: SYNTHROID  TAKE 1 TABLET DAILY BEFORE BREAKFAST   losartan  100 MG tablet Commonly known as: COZAAR  Take 0.5 tablets (50 mg total) by mouth daily. Start taking on: March 28, 2023 What changed: how much to take   multivitamin with minerals Tabs tablet Take 1 tablet by mouth daily.   nitroGLYCERIN  0.4 MG SL tablet Commonly known as: NITROSTAT  Place 1 tablet (0.4 mg total) under the tongue every 5 (five) minutes as needed for chest pain.   pantoprazole  20 MG tablet Commonly known as: Protonix  Take 1 tablet (20 mg total) by mouth daily.   repaglinide  1 MG tablet Commonly known as: PRANDIN  TAKE 1 TABLET DAILY BEFORE SUPPER   rOPINIRole  1 MG tablet Commonly known as: REQUIP  TAKE 1 TABLET AT BEDTIME   rosuvastatin  20 MG tablet Commonly known as: Crestor  Take 1 tablet (20 mg total) by mouth daily.   Trulicity  3 MG/0.5ML Soaj Generic drug: Dulaglutide  INJECT 0.5 ML (3 MG) UNDER THE SKIN ONCE A WEEK What changed:  how much to take how to take this when to take this additional instructions   Vitamin D3 1000 units Caps Take 1,000 Units by mouth daily.       Allergies  Allergen Reactions   Arimidex  [Anastrozole] Other (See Comments)    Joint pain   Glipizide  Other (See Comments)    Hypoglycemia    Metformin  And Related Other (See Comments)    diarrhea   Penicillins Hives and Rash      The results of significant diagnostics from this hospitalization (including imaging, microbiology, ancillary and laboratory) are listed below for reference.    Significant Diagnostic Studies: CARDIAC CATHETERIZATION Result Date: 03/27/2023   Mid LAD-1 lesion is 30% stenosed.   Mid LAD-2 lesion is 50% stenosed.   Mid Cx lesion is 20% stenosed.   Ost RCA to Prox RCA lesion is 50% stenosed.   2nd Mrg lesion is 20% stenosed.   Non-stenotic Mid LAD-3 lesion was previously treated. 1.  Patent mid LAD stent with mild proximal and mid disease relatively unchanged versus prior study. 2.  Ostial right coronary artery disease which is modest angiographically; RFR was 0.99. 3.  Coronary microvascular dysfunction with CFR of 1.8 (abnormal being less than 2-2.5) however  IMR is normal at 9 (normal being less than 25) 4.  LVEDP of 25 mmHg. 5.  Right upper extremity tortuosity requiring 75 cm destination sheath for coronary artery cannulation. Recommendation: Medical therapy.  The results were reviewed with the team be attending Dr. Santo.   ECHOCARDIOGRAM LIMITED Result Date: 03/24/2023    ECHOCARDIOGRAM LIMITED REPORT   Patient Name:   Valerie Ball Date of Exam: 03/24/2023 Medical Rec #:  [id number]        Height:       62.0 in Accession #:    [id number]       Weight:       143.0 lb Date of Birth:  December 03, 1945         BSA:          1.658 m Patient Age:    77 years         BP:           104/95 mmHg Patient Gender: F                HR:           71 bpm. Exam Location:  Inpatient Procedure: Limited Echo Indications:     LVEF  History:         Patient has prior history of Echocardiogram examinations, most                  recent 01/25/2023. CAD; Risk Factors:Hypertension and Diabetes.  Sonographer:     Jayson Gaskins Referring  Phys:  [id number] SUBRINA SUNDIL Diagnosing Phys: Dorn Ross MD IMPRESSIONS  1. Left ventricular ejection fraction, by estimation, is >75%. The left ventricle has hyperdynamic function. The left ventricle has no regional wall motion abnormalities.  2. The aortic valve is tricuspid. There is moderate calcification of the aortic valve. There is moderate thickening of the aortic valve.  3. IVC is small suggesting low RA pressure and hypovolemia  4. Limited echo to evaluate LV function FINDINGS  Left Ventricle: Left ventricular ejection fraction, by estimation, is >75%. The left ventricle has hyperdynamic function. The left ventricle has no regional wall motion abnormalities. Aortic Valve: The aortic valve is tricuspid. There is moderate calcification of the aortic valve. There is moderate thickening of the aortic valve. There is moderate aortic valve annular calcification. LEFT VENTRICLE PLAX 2D LVIDd:         4.00 cm LVIDs:         1.90 cm LV PW:         0.80 cm LV IVS:        0.90 cm LVOT diam:     1.80 cm LVOT Area:     2.54 cm  LEFT ATRIUM             Index LA Vol (A2C):   38.2 ml 23.04 ml/m LA Vol (A4C):   30.5 ml 18.40 ml/m LA Biplane Vol: 34.9 ml 21.05 ml/m   AORTA Ao Root diam: 2.90 cm Ao Asc diam:  3.50 cm  SHUNTS Systemic Diam: 1.80 cm Dorn Ross MD Electronically signed by Dorn Ross MD Signature Date/Time: 03/24/2023/2:22:13 PM    Final (Updated)    DG Chest Portable 1 View Result Date: 03/23/2023 CLINICAL DATA:  Chest pain, shortness of breath EXAM: PORTABLE CHEST 1 VIEW COMPARISON:  12/11/2022 FINDINGS: Heart and mediastinal contours are within normal limits. No focal opacities or effusions. No acute bony abnormality. Aortic atherosclerosis. IMPRESSION: No active disease. Electronically Signed   By: Franky  Dover M.D.   On: 03/23/2023 20:17   CARDIAC CATHETERIZATION Result Date: 03/08/2023   Ost RCA to Prox RCA lesion is 50% stenosed.   2nd Mrg lesion is 20% stenosed.   Mid Cx lesion is  20% stenosed.   Mid LAD-1 lesion is 30% stenosed.   Mid LAD-2 lesion is 50% stenosed.   Mid LAD-3 lesion is 99% stenosed.   A drug-eluting stent was successfully placed using a SYNERGY XD 2.25X16.   Post intervention, there is a 0% residual stenosis. Severe mid LAD stenosis Successful PTCA/DES x 1 mid LAD Moderate stenosis ostial RCA. This does not appear to be flow limiting. Recommendations: Continue DAPT with ASA and Plavix  for at least six months. Same day post PCI discharge.    Microbiology: No results found for this or any previous visit (from the past 240 hours).   Labs: Basic Metabolic Panel: Recent Labs  Lab 03/23/23 1718 03/24/23 0231 03/25/23 0247 03/26/23 0219 03/27/23 0251  NA 140 138 141 140 139  K 3.7 3.3* 3.6 3.8 3.8  CL 103 101 108 105 104  CO2 25 23 24 24 25   GLUCOSE 141* 129* 94 120* 108*  BUN 24* 24* 24* 23 22  CREATININE 1.37* 1.43* 1.41* 1.45* 1.29*  CALCIUM  9.7 8.9 8.6* 8.8* 8.9   Liver Function Tests: Recent Labs  Lab 03/24/23 0231  AST 34  ALT 23  ALKPHOS 47  BILITOT 1.2  PROT 6.6  ALBUMIN 3.6   No results for input(s): LIPASE, AMYLASE in the last 168 hours. No results for input(s): AMMONIA in the last 168 hours. CBC: Recent Labs  Lab 03/23/23 1718 03/24/23 0231  WBC 8.2 7.6  HGB 14.6 12.2  HCT 44.7 38.5  MCV 85.3 86.5  PLT 166 128*   Cardiac Enzymes: No results for input(s): CKTOTAL, CKMB, CKMBINDEX, TROPONINI in the last 168 hours. BNP: BNP (last 3 results) Recent Labs    03/24/23 0034  BNP 43.0    ProBNP (last 3 results) No results for input(s): PROBNP in the last 8760 hours.  CBG: Recent Labs  Lab 03/26/23 0630 03/26/23 1101 03/26/23 1555 03/26/23 2125 03/27/23 0612  GLUCAP 122* 141* 149* 127* 107*       Signed:  Sigurd Pac MD.  [facility] 03/27/2023, 11:48 AM

## 2023-03-27 NOTE — Progress Notes (Addendum)
 Patient Name: Valerie Ball Date of Encounter: 03/27/2023 [facility] Cardiologist: Alean SAUNDERS Madireddy, MD   Interval Summary  .    Patient seen after her heart catheterization. Feeling well after the procedure. Denies chest pain. Reports headache after starting imdur   Vital Signs .    Vitals:   03/27/23 0851 03/27/23 0856 03/27/23 0901 03/27/23 0906  BP: (!) 146/59 (!) 144/63 132/71 139/68  Pulse: 70 68 75 74  Resp: 19 20 11 15   Temp:      TempSrc:      SpO2: 98% 95% 94% 95%  Weight:      Height:        Intake/Output Summary (Last 24 hours) at 03/27/2023 0930 Last data filed at 03/26/2023 1858 Gross per 24 hour  Intake 240 ml  Output --  Net 240 ml      03/27/2023    4:58 AM 03/26/2023    4:41 AM 03/25/2023    5:10 AM  Last 3 Weights  Weight (lbs) 140 lb 14 oz 142 lb 6.7 oz 144 lb 6.4 oz  Weight (kg) 63.9 kg 64.6 kg 65.5 kg      Telemetry/ECG    NSR HR in the 60s-70s  - Personally Reviewed  Physical Exam .   GEN: No acute distress.  Laying flat in the bed. TR band on Right wrist  Neck: No JVD Cardiac:  RRR. Grade 2/6 systolic murmur present  Respiratory: Anterior lung exam clear. Normal work of breathing at rest  GI: Soft, nontender, non-distended  MS: No edema in BLE   Assessment & Plan .     Chest pain syndrome  CAD  -Patient recently underwent left heart catheterization on 03/18/2023 in the setting of unstable angina.  Found to have severe mid LAD stenosis that was treated with PTCA/DES x 1.  There was also moderate stenosis in the RCA that does not appear to be flow-limiting.  Started on aspirin , Plavix . -Patient presented again on/3 complaining of chest tightness/pain.  Troponin negative x 5 -Echocardiogram 1/4 showed EF greater than 75% with no regional wall motion abnormalities.  IVC was small, suggesting low RA pressure and hypovolemia. -CRP 1.0. ESR 15.  No mention of pericardial effusion on echo.  Unlikely to be pericarditis -Underwent  repeat left heart cath on 1/7-showed patent mid LAD stent with mild proximal and mid disease that was unchanged compared to prior study from 12/19.  There is 50% stenosis in ostial RCA, RFR was 0.99.  Found to have coronary microvascular dysfunction. -Recommended medical therapy/titration of antianginal therapy.   - Reports headache with imdur  60 mg daily. Decrease imdur  to 30 mg daily. If headache continues, could consider transition to amlodipine   - Start carvedilol  3.125 mg BID  -Continue aspirin  81 mg daily, Plavix  75 mg daily -Continue Zetia  10 mg daily, Crestor  20 mg daily. LDL 55 in 04/2022   AKI vs CKD stage IIIb progression  - Patient has underlying diabetes and HTN  -Creatinine was up to 1.45 yesterday.  Has now improved to 1.29 - Home ARB and SGLT2i held   Otherwise per primary  - GAD  - Restless leg syndrome  - Type 2 DM  - History of hiatal hernia/gastroparesis/GERD  - Hypothyroidism   For questions or updates, please contact [facility] Please consult [email or web address] for contact info under        Signed, Rollo FABIENE Louder, PA-C   Personally seen and examined. Agree with APP above with the following  comments: Found to have coronary microvascular disease. No CP today.  HA has improved.  SR on Telemetry.  Systolic crescendo murmur.  No R Radial bruit or hematoma.  Good distal pulses.  Rest as above. - will start BBB and lower nitrates for microvascular disease.  LDL is at goal.  Has prolong Qtc; would not pursue ranolazine at this time. - continue DAPT.   - ARB and SGLT2i restart when stable creatinine post DC. No further questions.  Stanly Leavens, MD FASE [facility] Cardiologist [facility]  [address] [place], [address] [place], [place] [address] [phone]  11:58 AM

## 2023-03-27 NOTE — Interval H&P Note (Signed)
 History and Physical Interval Note:  03/27/2023 6:57 AM  Valerie Ball  has presented today for surgery, with the diagnosis of unstable angina.  The various methods of treatment have been discussed with the patient and family. After consideration of risks, benefits and other options for treatment, the patient has consented to  Procedure(s): LEFT HEART CATH AND CORONARY ANGIOGRAPHY (N/A) as a surgical intervention.  The patient's history has been reviewed, patient examined, no change in status, stable for surgery.  I have reviewed the patient's chart and labs.  Questions were answered to the patient's satisfaction.     Adrien Dietzman K Tylique Aull

## 2023-03-27 NOTE — Progress Notes (Addendum)
 Discharge instructions (including medications) discussed with and copy provided to patient/caregiver  TOC medication was delivered to this patient.

## 2023-03-28 ENCOUNTER — Encounter (HOSPITAL_COMMUNITY): Payer: Self-pay | Admitting: Internal Medicine

## 2023-03-29 ENCOUNTER — Telehealth: Payer: Self-pay

## 2023-03-29 NOTE — Transitions of Care (Post Inpatient/ED Visit) (Signed)
 03/29/2023  Name: Valerie Ball MRN: [id number] DOB: 01/13/1946  Today's TOC FU Call Status: Today's TOC FU Call Status:: Successful TOC FU Call Completed TOC FU Call Complete Date: 03/29/23 Patient's Name and Date of Birth confirmed.  Transition Care Management Follow-up Telephone Call Date of Discharge: 03/28/23 Discharge Facility: [facility]) Type of Discharge: Emergency Department Reason for ED Visit: Cardiac Conditions Cardiac Conditions Diagnosis: Chest Pain Persisting How have you been since you were released from the hospital?: Better Any questions or concerns?: No  Items Reviewed: Did you receive and understand the discharge instructions provided?: Yes Medications obtained,verified, and reconciled?: Yes (Medications Reviewed) Any new allergies since your discharge?: No Dietary orders reviewed?: Yes Do you have support at home?: Yes People in Home: spouse  Medications Reviewed Today: Medications Reviewed Today     Reviewed by Emmitt Pan, LPN (Licensed Practical Nurse) on 03/29/23 at 1419  Med List Status: <None>   Medication Order Taking? Sig Documenting Provider Last Dose Status Informant  aspirin  EC 81 MG tablet [id number] No Take 1 tablet (81 mg total) by mouth daily. Swallow whole. MadireddyAlean SAUNDERS, MD 03/22/2023 Active Self, Pharmacy Records  Calcium  Carbonate (CALCIUM  500 PO) [id number] No Take 500 mg by mouth daily. [provider] 03/22/2023 Active Self, Pharmacy Records  carvedilol  (COREG ) 3.125 MG tablet [id number]  Take 1 tablet (3.125 mg total) by mouth 2 (two) times daily with a meal. Fairy Frames, MD  Active   Cholecalciferol (VITAMIN D3) 1000 units CAPS [id number] No Take 1,000 Units by mouth daily. [provider] 03/22/2023 Active Self, Pharmacy Records  clopidogrel  (PLAVIX ) 75 MG tablet [id number] No Take 1 tablet (75 mg total) by mouth daily. Trudy Birmingham, PA-C 03/22/2023 Active Self, Pharmacy Records  Cyanocobalamin  (B-12)  1000 MCG TABS [id number] No Take 1,000 mcg by mouth daily. [provider] 03/22/2023 Active Self, Pharmacy Records  Dulaglutide  (TRULICITY ) 3 MG/0.5ML SOPN [id number] No INJECT 0.5 ML (3 MG) UNDER THE SKIN ONCE A WEEK  Patient taking differently: Inject 0.5 mLs into the skin once a week. On Saturdays   Trixie File, MD 03/17/2023 Active Self, Pharmacy Records  empagliflozin  (JARDIANCE ) 25 MG TABS tablet [id number] No Take 1 tablet (25 mg total) by mouth daily before breakfast. Trixie File, MD 03/23/2023 Morning Active Self, Pharmacy Records  ezetimibe  (ZETIA ) 10 MG tablet [id number] No TAKE 1 TABLET DAILY Tower, Laine LABOR, MD 03/22/2023 Active Self, Pharmacy Records  famotidine  (PEPCID ) 20 MG tablet [id number] No Take 1 tablet (20 mg total) by mouth 2 (two) times daily. Tower, Laine LABOR, MD 03/23/2023 Active Self, Pharmacy Records  FLUoxetine  (PROZAC ) 40 MG capsule [id number] No Take 1 capsule (40 mg total) by mouth daily. Tower, Laine LABOR, MD 03/22/2023 Active Self, Pharmacy Records  fluticasone  (FLONASE ) 50 MCG/ACT nasal spray [id number] No USE 2 SPRAYS IN EACH NOSTRIL DAILY AS NEEDED FOR ALLERGIES Tower, Laine LABOR, MD Taking Active Self, Pharmacy Records  hydrochlorothiazide  (HYDRODIURIL ) 25 MG tablet [id number] No TAKE 1 TABLET DAILY Tower, Laine LABOR, MD 03/23/2023 Morning Active Self, Pharmacy Records  isosorbide  mononitrate (IMDUR ) 30 MG 24 hr tablet [id number]  Take 1 tablet (30 mg total) by mouth daily. Fairy Frames, MD  Active   levothyroxine  (SYNTHROID ) 50 MCG tablet [id number] No TAKE 1 TABLET DAILY BEFORE BREAKFAST Tower, Laine LABOR, MD 03/23/2023 Active Self, Pharmacy Records  losartan  (COZAAR ) 100 MG tablet [id number]  Take 0.5 tablets (50 mg total) by mouth daily. Fairy Frames, MD  Active   Multiple Vitamin (MULTIVITAMIN WITH MINERALS)  TABS tablet [id number] No Take 1 tablet by mouth daily. [provider] 03/22/2023 Active Self, Pharmacy Records  nitroGLYCERIN  (NITROSTAT ) 0.4 MG SL tablet  [id number] No Place 1 tablet (0.4 mg total) under the tongue every 5 (five) minutes as needed for chest pain. Trudy Birmingham, PA-C Taking Active Self, Pharmacy Records  pantoprazole  (PROTONIX ) 20 MG tablet [id number] No Take 1 tablet (20 mg total) by mouth daily. Trudy Birmingham, PA-C 03/22/2023 Active Self, Pharmacy Records  repaglinide  (PRANDIN ) 1 MG tablet [id number] No TAKE 1 TABLET DAILY BEFORE SUPPER Trixie File, MD 03/22/2023 Active Self, Pharmacy Records  rOPINIRole  (REQUIP ) 1 MG tablet [id number] No TAKE 1 TABLET AT BEDTIME Tower, Laine LABOR, MD 03/22/2023 Active Self, Pharmacy Records  rosuvastatin  (CRESTOR ) 20 MG tablet [id number] No Take 1 tablet (20 mg total) by mouth daily. Trudy Birmingham, PA-C 03/22/2023 Active Self, Pharmacy Records            Home Care and Equipment/Supplies: Were Home Health Services Ordered?: NA Any new equipment or medical supplies ordered?: NA  Functional Questionnaire: Do you need assistance with bathing/showering or dressing?: No Do you need assistance with meal preparation?: No Do you need assistance with eating?: No Do you have difficulty maintaining continence: No Do you need assistance with getting out of bed/getting out of a chair/moving?: No Do you have difficulty managing or taking your medications?: No  Follow up appointments reviewed: PCP Follow-up appointment confirmed?: Yes Date of PCP follow-up appointment?: 03/30/23 Follow-up Provider: [facility] Follow-up appointment confirmed?: Yes Date of Specialist follow-up appointment?: 04/09/23 Follow-Up Specialty Provider:: cardio Do you need transportation to your follow-up appointment?: No Do you understand care options if your condition(s) worsen?: Yes-patient verbalized understanding    SIGNATURE Julian Lemmings, LPN [facility] Nurse Health Advisor Direct Dial [phone]

## 2023-03-30 ENCOUNTER — Inpatient Hospital Stay: Payer: TRICARE For Life (TFL) | Admitting: Family Medicine

## 2023-04-02 ENCOUNTER — Encounter: Payer: Self-pay | Admitting: Family Medicine

## 2023-04-02 ENCOUNTER — Ambulatory Visit (INDEPENDENT_AMBULATORY_CARE_PROVIDER_SITE_OTHER): Payer: TRICARE For Life (TFL) | Admitting: Family Medicine

## 2023-04-02 VITALS — BP 110/68 | HR 76 | Temp 98.3°F | Ht 62.0 in | Wt 145.4 lb

## 2023-04-02 DIAGNOSIS — H34832 Tributary (branch) retinal vein occlusion, left eye, with macular edema: Secondary | ICD-10-CM | POA: Diagnosis not present

## 2023-04-02 DIAGNOSIS — I10 Essential (primary) hypertension: Secondary | ICD-10-CM

## 2023-04-02 DIAGNOSIS — R079 Chest pain, unspecified: Secondary | ICD-10-CM

## 2023-04-02 DIAGNOSIS — N1832 Chronic kidney disease, stage 3b: Secondary | ICD-10-CM | POA: Diagnosis not present

## 2023-04-02 DIAGNOSIS — N189 Chronic kidney disease, unspecified: Secondary | ICD-10-CM

## 2023-04-02 DIAGNOSIS — N183 Chronic kidney disease, stage 3 unspecified: Secondary | ICD-10-CM | POA: Insufficient documentation

## 2023-04-02 HISTORY — DX: Chronic kidney disease, unspecified: N18.9

## 2023-04-02 NOTE — Assessment & Plan Note (Signed)
 Recently hosp for unstable angina  Reviewed hospital records, lab results and studies in detail   Trop negative,echo with preserved EF , no wall motion abn  Repeat cath - patent LAD stend Noted coronary microvascular dysf -other wise no change    Now maximized on med management  Carvedilol  (HR is stable)  Imdur  (better at lower dose) Losartan   Symptoms are resolved   To start cardiac rehab to deconditioning   Overall much clinical improvement  Follow up with cardiology on 1/20

## 2023-04-02 NOTE — Assessment & Plan Note (Signed)
 Latest GFR 43  Is hydrating better now  Back on losartan 50 mg which she is tolerating

## 2023-04-02 NOTE — Assessment & Plan Note (Signed)
 No clinical changes Under care of Dr Luciana Axe

## 2023-04-02 NOTE — Patient Instructions (Signed)
 Please let us  know if any symptoms develop or return  Stable blood pressure and pulse today   Keep drinking fluids Take care of yourself   Continue your current medicines See cardiology as planned   Make sure to let them know about the headache- imdur  may be causing it

## 2023-04-02 NOTE — Assessment & Plan Note (Signed)
 bp in fair control at this time  BP Readings from Last 1 Encounters:  04/02/23 110/68   No changes needed Most recent labs reviewed  Disc lifstyle change with low sodium diet and exercise  After last hospitalization for angina new med regimen is Carvedilol  3.125 mg bid Imdur  30 mg daily (does give her a mild headache) Losartan  50 mg daily for renal protection (reduced dose) Hydrating better  No symptoms of hypotension  No angina   Reviewed hospital records, lab results and studies in detail   For cardiology follow up 1/20

## 2023-04-02 NOTE — Progress Notes (Signed)
 Subjective:    Patient ID: Valerie Ball, female    DOB: 1945-12-02, 78 y.o.   MRN: [id number]  HPI  Wt Readings from Last 3 Encounters:  04/02/23 145 lb 6 oz (65.9 kg)  03/27/23 140 lb 14 oz (63.9 kg)  03/23/23 143 lb 4 oz (65 kg)   26.59 kg/m  Vitals:   04/02/23 1024  BP: 110/68  Pulse: 76  Temp: 98.3 F (36.8 C)  SpO2: 95%   Pt presents for follow up of hospitalization for chest pain  Last visit here was sent to ER   She was dx with unstable angina  Had recently (prior had PTCA and stenting of mid LAD) Then readmitted with exertional cp   Hosp course as follows Hospital Course:    CAD Persistent chest pain  03/18/23 underwent PTCA and stenting of mid LAD  -Readmitted with persistent chest pain worse on exertion, troponins negative, echo noted preserved EF with no wall motion abnormality, no pericardial effusion -Repeat cath today noted patent mid LAD stent, mild proximal and mid disease unchanged from prior, 50% stenosis in ostial RCA RFR was 0.99, coronary microvascular dysfunction noted, medical therapy and anti-antianginal therapy recommended -Imdur  dose decreased to 30 Mg, started on carvedilol , continued aspirin , Plavix , Crestor , Zetia  Follow-up with [facility] heart care  (has cardiology appointment on 1/20)    AKI on  CKD stage II:  -Improved, resume losartan  at lower dose tomorrow   Hypokalemia: Supplemented   GAD: On Prozac    Restless leg syndrome : On ropinirole    Non-insulin -dependent diabetes type 2:  -On Trulicity , Jardiance , at baseline   History of hiatal hernia/gastroparesis/GERD: Continue PPI   History of hyperlipidemia: On Crestor , ezetimibe    History of hypothyroidism: On levothyroxine    Thrombocytopenia: Chronic, stableow taking  Carvedilol  3.125 mg bid  Imdur  30 mg daily  Losartan  50 mg daily   BP Readings from Last 3 Encounters:  04/02/23 110/68  03/27/23 104/65  03/23/23 (!) 96/50   Pulse Readings from Last 3 Encounters:   04/02/23 76  03/27/23 66  03/23/23 86   No longer having cp  Tiring easily- getting her exercise tolerance back   Some headaches-possible due to imdur   Not constant   She shooting pain behind eye is finally gone   Drinking fluids/ really pushing it now  Cold water   Will start cardio rehab next week   Mood  Fluoxetine  40 daily  That is helping  Resting better overall     Lab Results  Component Value Date   NA 139 03/27/2023   K 3.8 03/27/2023   CO2 25 03/27/2023   GLUCOSE 108 (H) 03/27/2023   BUN 22 03/27/2023   CREATININE 1.29 (H) 03/27/2023   CALCIUM  8.9 03/27/2023   GFR 46.38 (L) 05/04/2022   EGFR 51 (L) 03/05/2023   GFRNONAA 43 (L) 03/27/2023   Back on losartan  now  Lab Results  Component Value Date   MICROALBUR 0.8 07/23/2009   MICROALBUR 0.8 09/14/2008  Sees endocrinolgy for dm    Lab Results  Component Value Date   WBC 7.6 03/24/2023   HGB 12.2 03/24/2023   HCT 38.5 03/24/2023   MCV 86.5 03/24/2023   PLT 128 (L) 03/24/2023   Lab Results  Component Value Date   TSH 4.03 05/04/2022      Patient Active Problem List   Diagnosis Date Noted   Eye pain 03/23/2023   History of CAD (coronary artery disease) 03/23/2023   Chronic idiopathic thrombocytopenia (HCC) 03/23/2023  Mitral stenosis and aortic incompetence, by TTE 01/25/2023 02/01/2023   CAD (coronary artery disease)  CT Cardiac 01/19/2023 calcium  score 411, plaque volume 366, moderate stenosis LAD, mild stenosis D1, RCA, LCx/OM 3; S/p PCI of Mid LAD 03/08/2023 02/01/2023   Poor balance 01/30/2023   Dyspnea on exertion 01/04/2023   Palpitations 01/04/2023   Allergy    Angiodysplasia of colon    Anxiety    Arthritis    Borderline diabetic    Colon polyp    Diverticulosis    GI bleed    Hepatic steatosis    Hiatal hernia    Hyperlipidemia    Hypertension    Macular edema    Menopause    Postmenopausal hormone therapy    Seizures (HCC)    Chest pain 12/29/2022   Bilateral knee  pain 05/11/2022   Decreased calculated GFR 05/11/2022   Blurred vision, right eye 08/11/2021   History of COVID-19 09/24/2020   Fever, low grade 09/24/2020   Posterior vitreous detachment of right eye 11/06/2019   Cystoid macular edema of left eye 07/14/2019   Branch retinal vein occlusion with macular edema of left eye 07/14/2019   Hypertensive retinopathy of left eye 07/14/2019   Vitreous floaters of left eye 07/14/2019   Fatigue 05/20/2018   Hip pain, bilateral 08/31/2017   Vitamin B12 deficiency 09/21/2016   Diabetic gastroparesis (HCC) 03/27/2016   Retinal vein occlusion, branch 01/03/2016   Alternating constipation and diarrhea 08/12/2015   AVM (arteriovenous malformation) of colon 05/18/2015   IBS (irritable bowel syndrome) 02/28/2015   Diverticulitis of colon 10/14/2014   Gout 07/22/2014   Hypothyroidism 04/23/2014   Encounter for Medicare annual wellness exam 01/23/2014   Estrogen deficiency 01/23/2014   Colon cancer screening 01/23/2014   Tremor 01/20/2014   Dizziness 01/07/2014   Allergic rhinitis 01/07/2014   Thrombocytopenia (HCC) 11/25/2013   Diabetic neuropathy (HCC) 11/25/2013   History of removal of implants of both breasts 07/01/2013   Lap Nissen October 2013 01/19/2012   Large type III mixed hiatus hernia  12/06/2011   Stress reaction 10/25/2011   Routine general medical examination at a health care facility 07/11/2011   Non-insulin  treated type 2 diabetes mellitus (HCC) 07/25/2010   TRANSAMINASES, SERUM, ELEVATED 11/11/2009   History of breast cancer 08/02/2009   Breast cancer (HCC) 02/2009   ESOPHAGEAL STRICTURE 03/02/2008   Diaphragmatic hernia 03/02/2008   Peritoneal adhesions 03/02/2008   Anemia 02/07/2008   Melena 01/2008   Poorly controlled type 2 diabetes mellitus with neuropathy (HCC) 08/19/2007   GAD (generalized anxiety disorder) 04/15/2007   DISORDERS, ORGANIC SLEEP NEC 08/23/2006   SYNDROME, RESTLESS LEGS 08/23/2006   SYNDROME, CARPAL  TUNNEL 08/23/2006   Hyperlipidemia associated with type 2 diabetes mellitus (HCC) 08/22/2006   Essential hypertension 08/22/2006   Allergic rhinitis 08/22/2006   Fatty liver 08/22/2006   History of seizure 08/22/2006   Colon stricture (HCC) 01/14/2003   Salmonella 05/2000   Past Medical History:  Diagnosis Date   Allergic rhinitis 08/22/2006   Qualifier: Diagnosis of   By: Cecilie CMA, NANNIE Ivy      IMO SNOMED Dx Update Oct 2024     Allergy    allegic rhinitis   Alternating constipation and diarrhea 08/12/2015   Anemia    Angiodysplasia of colon    Anxiety    Arthritis    AVM (arteriovenous malformation) of colon    Bilateral knee pain 05/11/2022   Blurred vision, right eye 08/11/2021   Patient does have blurred  vision right eye,     Borderline diabetic    per patient medical history form   Branch retinal vein occlusion with macular edema of left eye 07/14/2019   CME superior to the fovea OS has worsened at 7-week interval from macular branch retinal vein occlusion, repeat Avastin  today and evaluate exam in 6 weeks     Breast cancer (HCC) 02/2009   breast CA L invasive ductal CA(hormone receptor positive.)   CAD (coronary artery disease)  CT Cardiac 01/19/2023 calcium  score 411, plaque volume 366, moderate stenosis LAD, mild stenosis D1, RCA, LCx/OM 3 02/01/2023   Chest pain 12/29/2022   Colon cancer screening 01/23/2014   Colon polyp    hyperplastic   Colon stricture (HCC) 01/14/2003   Cystoid macular edema of left eye 07/14/2019   Decreased calculated GFR 05/11/2022   Diabetic gastroparesis (HCC) 03/27/2016   Typical symptoms -suspect      No detectable diabetic retinopathy 12-27-2020     Diabetic neuropathy (HCC) 11/25/2013   Diaphragmatic hernia 03/02/2008   Qualifier: Diagnosis of   By: Genie CMA LEODIS), Leisha      IMO SNOMED Dx Update Oct 2024     Diverticulitis of colon 10/14/2014   Diverticulosis    Dizziness 01/07/2014   Dyspnea on exertion 01/04/2023    Encounter for Medicare annual wellness exam 01/23/2014   ESOPHAGEAL STRICTURE 03/02/2008   Qualifier: Diagnosis of   By: Genie CMA (AAMA), Chick         Essential hypertension 08/22/2006   Qualifier: Diagnosis of   By: Cecilie CMA, NANNIE Ivy         Estrogen deficiency 01/23/2014   Fatigue 05/20/2018   Fatty liver    Fever, low grade 09/24/2020   GAD (generalized anxiety disorder) 04/15/2007   Qualifier: Diagnosis of   By: Baird FNP, Frieda Kipper        GI bleed    Gout    Hepatic steatosis    Hiatal hernia    Hip pain, bilateral 08/31/2017   History of breast cancer 08/02/2009   Qualifier: Diagnosis of   By: Randeen MD, Laine Caldron        History of COVID-19 09/24/2020   History of removal of implants of both breasts 07/01/2013   06/25/13 elected to have bilateral breast implants removed.     History of seizure 08/22/2006   Qualifier: Diagnosis of   By: Cecilie CMA, NANNIE Ivy         Hyperlipidemia    Hyperlipidemia associated with type 2 diabetes mellitus (HCC) 08/22/2006   Qualifier: Diagnosis of   By: Cecilie CMA, NANNIE Ivy         Hypertension    Hypertensive retinopathy of left eye 07/14/2019   Hypothyroidism    IBS (irritable bowel syndrome)    Lap Nissen October 2013 01/19/2012   Laparoscopic takedown of large type III mixed hiatus hernia with primary closure of the diaphragm with 3 sutures posteriorally (post 2 with pledgets) and Nissen fundoplication over a #56 lighted bougie        Large type III mixed hiatus hernia  12/06/2011   Plan surgery to repair diaphragm and do Nissen fundoplication     Macular edema    left eye   Melena 01/2008   anemia transfusion   Menopause    per patient medical history form   Mitral stenosis and aortic incompetence, by TTE 01/25/2023 02/01/2023   Palpitations 01/04/2023   Peritoneal adhesions 03/02/2008   Qualifier: Diagnosis of  By: Kowalk CMA (AAMA), Leisha      IMO SNOMED Dx Update Oct 2024     Poor balance  01/30/2023   Poorly controlled type 2 diabetes mellitus with neuropathy (HCC) 08/19/2007   Qualifier: Diagnosis of   By: Randeen MD, Laine Caldron        Posterior vitreous detachment of right eye 11/06/2019   Postmenopausal hormone therapy    per patient medical history form.   Retinal vein occlusion, branch 01/03/2016   Left with hemorrhage and edema (Dr Elner) 2017     Routine general medical examination at a health care facility 07/11/2011   Salmonella 05/2000   renal effects secondary to dehydration   Seizure disorder (HCC)    Seizures (HCC)    one seizure several yrs ago --etiology unknown-no problem since   Stress reaction 10/25/2011   SYNDROME, CARPAL TUNNEL 08/23/2006   Qualifier: Diagnosis of   By: Randeen MD, Laine Caldron        SYNDROME, RESTLESS LEGS 08/23/2006   Qualifier: Diagnosis of   By: Randeen MD, Laine Caldron        Thrombocytopenia (HCC) 11/25/2013   TRANSAMINASES, SERUM, ELEVATED 11/11/2009   Qualifier: Diagnosis of   By: Randeen MD, Laine Caldron        Tremor 01/20/2014   Head and right hand      Vitamin B12 deficiency 09/21/2016   Vitreous floaters of left eye 07/14/2019   The nature of vitreous floaters was discussed with the patient as well as their frequent occurrence with development of posterior vitreous detachment. The significance of flashes and field cuts was discussed with the patient. The need for a dilated fundus examination was discussed and advice given to return immediately for new or different floaters .  More uncommonly, vitreous floaters, debris, or   Past Surgical History:  Procedure Laterality Date   ABDOMINAL HYSTERECTOMY  1983   BREAST SURGERY  02/2009   breast biopsy invasive ductal CA-bilateral mastectomies   CORONARY PRESSURE/FFR STUDY N/A 03/27/2023   Procedure: CORONARY PRESSURE/FFR STUDY;  Surgeon: Wendel Lurena POUR, MD;  Location: [facility];  Service: Cardiovascular;  Laterality: N/A;   CORONARY STENT INTERVENTION N/A 03/08/2023   Procedure:  CORONARY STENT INTERVENTION;  Surgeon: Verlin Lonni BIRCH, MD;  Location: [facility];  Service: Cardiovascular;  Laterality: N/A;   EPIGASTRIC HERNIA REPAIR  01/02/2012   Procedure: HERNIA REPAIR EPIGASTRIC ADULT;  Surgeon: Donnice KATHEE Lunger, MD;  Location: [facility];  Service: General;  Laterality: N/A;  Laparoscopic Repair Paraesophageal Hernia, Nissen   EYE SURGERY Left 07/2017   Dr. Elner GULA CYST EXCISION     LAPAROSCOPIC NISSEN FUNDOPLICATION  01/02/2012   Procedure: LAPAROSCOPIC NISSEN FUNDOPLICATION;  Surgeon: Donnice KATHEE Lunger, MD;  Location: [facility];  Service: General;  Laterality: N/A;   LEFT HEART CATH AND CORONARY ANGIOGRAPHY N/A 03/08/2023   Procedure: LEFT HEART CATH AND CORONARY ANGIOGRAPHY;  Surgeon: Verlin Lonni BIRCH, MD;  Location: [facility];  Service: Cardiovascular;  Laterality: N/A;   LEFT HEART CATH AND CORONARY ANGIOGRAPHY N/A 03/27/2023   Procedure: LEFT HEART CATH AND CORONARY ANGIOGRAPHY;  Surgeon: Wendel Lurena POUR, MD;  Location: [facility];  Service: Cardiovascular;  Laterality: N/A;   MASTECTOMY  04/2009   Bilateral for ductal carcinoma on L   OVARY SURGERY  1986   ovarian tumor removed   TUBAL LIGATION     Social History   Tobacco Use   Smoking status: Never  Smokeless tobacco: Never  Vaping Use   Vaping status: Never Used  Substance Use Topics   Alcohol use: Yes    Comment: occasional   Drug use: Never   Family History  Problem Relation Age of Onset   Hypertension Mother    Stroke Mother    Breast cancer Mother    Heart attack Father    Diabetes Father    Esophageal cancer Father    Breast cancer Sister        x 2 sisters   Diabetes Sister    Hypertension Sister    Breast cancer Sister    Hypertension Brother    Diabetes Brother    Allergies  Allergen Reactions   Arimidex [Anastrozole] Other (See Comments)    Joint pain   Glipizide  Other (See Comments)    Hypoglycemia    Metformin  And Related  Other (See Comments)    diarrhea   Penicillins Hives and Rash   Current Outpatient Medications on File Prior to Visit  Medication Sig Dispense Refill   aspirin  EC 81 MG tablet Take 1 tablet (81 mg total) by mouth daily. Swallow whole.     Calcium  Carbonate (CALCIUM  500 PO) Take 500 mg by mouth daily.     carvedilol  (COREG ) 3.125 MG tablet Take 1 tablet (3.125 mg total) by mouth 2 (two) times daily with a meal. 60 tablet 1   Cholecalciferol (VITAMIN D3) 1000 units CAPS Take 1,000 Units by mouth daily.     clopidogrel  (PLAVIX ) 75 MG tablet Take 1 tablet (75 mg total) by mouth daily. 30 tablet 5   Cyanocobalamin  (B-12) 1000 MCG TABS Take 1,000 mcg by mouth daily.     Dulaglutide  (TRULICITY ) 3 MG/0.5ML SOPN INJECT 0.5 ML (3 MG) UNDER THE SKIN ONCE A WEEK (Patient taking differently: Inject 0.5 mLs into the skin once a week. On Saturdays) 6 mL 3   empagliflozin  (JARDIANCE ) 25 MG TABS tablet Take 1 tablet (25 mg total) by mouth daily before breakfast. 90 tablet 3   ezetimibe  (ZETIA ) 10 MG tablet TAKE 1 TABLET DAILY 90 tablet 3   famotidine  (PEPCID ) 20 MG tablet Take 1 tablet (20 mg total) by mouth 2 (two) times daily. 180 tablet 1   FLUoxetine  (PROZAC ) 40 MG capsule Take 1 capsule (40 mg total) by mouth daily. 90 capsule 0   fluticasone  (FLONASE ) 50 MCG/ACT nasal spray USE 2 SPRAYS IN EACH NOSTRIL DAILY AS NEEDED FOR ALLERGIES 48 g 0   hydrochlorothiazide  (HYDRODIURIL ) 25 MG tablet TAKE 1 TABLET DAILY 90 tablet 2   isosorbide  mononitrate (IMDUR ) 30 MG 24 hr tablet Take 1 tablet (30 mg total) by mouth daily. 30 tablet 1   levothyroxine  (SYNTHROID ) 50 MCG tablet TAKE 1 TABLET DAILY BEFORE BREAKFAST 90 tablet 1   losartan  (COZAAR ) 100 MG tablet Take 0.5 tablets (50 mg total) by mouth daily.     Multiple Vitamin (MULTIVITAMIN WITH MINERALS) TABS tablet Take 1 tablet by mouth daily.     nitroGLYCERIN  (NITROSTAT ) 0.4 MG SL tablet Place 1 tablet (0.4 mg total) under the tongue every 5 (five) minutes as  needed for chest pain. 25 tablet 1   pantoprazole  (PROTONIX ) 20 MG tablet Take 1 tablet (20 mg total) by mouth daily. 30 tablet 5   repaglinide  (PRANDIN ) 1 MG tablet TAKE 1 TABLET DAILY BEFORE SUPPER 90 tablet 3   rOPINIRole  (REQUIP ) 1 MG tablet TAKE 1 TABLET AT BEDTIME 90 tablet 3   rosuvastatin  (CRESTOR ) 20 MG tablet Take 1 tablet (20  mg total) by mouth daily. 30 tablet 3   No current facility-administered medications on file prior to visit.    Review of Systems  Constitutional:  Positive for fatigue. Negative for activity change, appetite change, fever and unexpected weight change.  HENT:  Negative for congestion, ear pain, rhinorrhea, sinus pressure and sore throat.   Eyes:  Negative for pain, redness and visual disturbance.  Respiratory:  Negative for cough, shortness of breath and wheezing.        Some exercise intolerance To start cardio/pulm rehab soon   Cardiovascular:  Negative for chest pain and palpitations.  Gastrointestinal:  Negative for abdominal pain, blood in stool, constipation and diarrhea.  Endocrine: Negative for polydipsia and polyuria.  Genitourinary:  Negative for dysuria, frequency and urgency.  Musculoskeletal:  Negative for arthralgias, back pain and myalgias.  Skin:  Negative for pallor and rash.  Allergic/Immunologic: Negative for environmental allergies.  Neurological:  Negative for dizziness, syncope and headaches.  Hematological:  Negative for adenopathy. Does not bruise/bleed easily.  Psychiatric/Behavioral:  Negative for decreased concentration and dysphoric mood. The patient is not nervous/anxious.        Objective:   Physical Exam Constitutional:      General: She is not in acute distress.    Appearance: Normal appearance. She is well-developed and normal weight. She is not ill-appearing or diaphoretic.  HENT:     Head: Normocephalic and atraumatic.  Eyes:     Conjunctiva/sclera: Conjunctivae normal.     Pupils: Pupils are equal, round, and  reactive to light.  Neck:     Thyroid : No thyromegaly.     Vascular: No carotid bruit or JVD.  Cardiovascular:     Rate and Rhythm: Normal rate and regular rhythm.     Heart sounds: Normal heart sounds.     No gallop.  Pulmonary:     Effort: Pulmonary effort is normal. No respiratory distress.     Breath sounds: Normal breath sounds. No wheezing or rales.  Abdominal:     General: There is no distension or abdominal bruit.     Palpations: Abdomen is soft.  Musculoskeletal:     Cervical back: Normal range of motion and neck supple.     Right lower leg: No edema.     Left lower leg: No edema.  Lymphadenopathy:     Cervical: No cervical adenopathy.  Skin:    General: Skin is warm and dry.     Coloration: Skin is not jaundiced or pale.     Findings: No rash.     Comments: Some old bruising at cath site   Neurological:     Mental Status: She is alert.     Coordination: Coordination normal.     Deep Tendon Reflexes: Reflexes are normal and symmetric. Reflexes normal.  Psychiatric:        Mood and Affect: Mood normal.           Assessment & Plan:   Problem List Items Addressed This Visit       Cardiovascular and Mediastinum   Essential hypertension - Primary   bp in fair control at this time  BP Readings from Last 1 Encounters:  04/02/23 110/68   No changes needed Most recent labs reviewed  Disc lifstyle change with low sodium diet and exercise  After last hospitalization for angina new med regimen is Carvedilol  3.125 mg bid Imdur  30 mg daily (does give her a mild headache) Losartan  50 mg daily for renal protection (reduced dose)  Hydrating better  No symptoms of hypotension  No angina   Reviewed hospital records, lab results and studies in detail   For cardiology follow up 1/20       Branch retinal vein occlusion with macular edema of left eye   No clinical changes Under care of Dr Elner         Genitourinary   RESOLVED: CKD (chronic kidney disease)  stage 3, GFR 30-59 ml/min (HCC)   Latest GFR 43  Is hydrating better now  Back on losartan  50 mg which she is tolerating          Other   Chest pain   Recently hosp for unstable angina  Reviewed hospital records, lab results and studies in detail   Trop negative,echo with preserved EF , no wall motion abn  Repeat cath - patent LAD stend Noted coronary microvascular dysf -other wise no change    Now maximized on med management  Carvedilol  (HR is stable)  Imdur  (better at lower dose) Losartan   Symptoms are resolved   To start cardiac rehab to deconditioning   Overall much clinical improvement  Follow up with cardiology on 1/20       Breast cancer (HCC)

## 2023-04-03 DIAGNOSIS — H43811 Vitreous degeneration, right eye: Secondary | ICD-10-CM | POA: Diagnosis not present

## 2023-04-03 DIAGNOSIS — H02836 Dermatochalasis of left eye, unspecified eyelid: Secondary | ICD-10-CM | POA: Diagnosis not present

## 2023-04-03 DIAGNOSIS — H02833 Dermatochalasis of right eye, unspecified eyelid: Secondary | ICD-10-CM | POA: Diagnosis not present

## 2023-04-03 DIAGNOSIS — H43392 Other vitreous opacities, left eye: Secondary | ICD-10-CM | POA: Diagnosis not present

## 2023-04-03 DIAGNOSIS — H34832 Tributary (branch) retinal vein occlusion, left eye, with macular edema: Secondary | ICD-10-CM | POA: Diagnosis not present

## 2023-04-03 DIAGNOSIS — H35352 Cystoid macular degeneration, left eye: Secondary | ICD-10-CM | POA: Diagnosis not present

## 2023-04-03 DIAGNOSIS — H35032 Hypertensive retinopathy, left eye: Secondary | ICD-10-CM | POA: Diagnosis not present

## 2023-04-03 LAB — HM DIABETES EYE EXAM

## 2023-04-09 ENCOUNTER — Ambulatory Visit: Payer: Medicare HMO

## 2023-04-09 VITALS — BP 108/62 | HR 74 | Ht 62.0 in | Wt 145.4 lb

## 2023-04-09 DIAGNOSIS — I25110 Atherosclerotic heart disease of native coronary artery with unstable angina pectoris: Secondary | ICD-10-CM | POA: Diagnosis not present

## 2023-04-09 MED ORDER — AMLODIPINE BESYLATE 2.5 MG PO TABS: 2.5000 mg | ORAL_TABLET | Freq: Every day | ORAL | 3 refills | Status: DC

## 2023-04-09 MED ORDER — LOSARTAN POTASSIUM 25 MG PO TABS: 25.0000 mg | ORAL_TABLET | Freq: Every day | ORAL | 3 refills | Status: DC

## 2023-04-09 NOTE — Assessment & Plan Note (Addendum)
Recent PCI of LAD in December. With recurrent symptoms of chest discomfort concerning for unstable angina had redo CABG 03/27/2023 with stable disease, microvascular coronary dysfunction suggested by CFR 1.8 and mildly elevated LVEDP 25 mmHg.  At this time no acute worsening of symptoms but she does have mild chest discomfort with exertion.  Will escalate antianginal regimen. Also recommended low-salt diet to avoid fluid overload.  Continue dual antiplatelet therapy with aspirin 81 mg once daily and Plavix 75 mg once daily.  Continue rosuvastatin 20 mg daily and ezetimibe 10 mg once daily.  For antianginal regimen continue carvedilol 3.125 mg twice daily. Continue Imdur 30 mg once daily.  She has mild headache but appears to be tolerating well, recommended to continue with the same at the current dose and expect headache symptoms to resolve over time. Escalate therapy with adding amlodipine 2.5 mg once daily.  In order to avoid low blood pressures we will discontinue hydrochlorothiazide and lower dose of losartan to 25 mg once daily.  Cardiac rehab as currently scheduled.  Will follow-up in the clinic tentatively in 3 months.

## 2023-04-09 NOTE — Patient Instructions (Signed)
Medication Instructions:    Stop taking hydrochlorothiazide.   Decrease your Losartan to 25 mg once a day. New prescription has been sent to your pharmacy.  Start taking Amlodipine 2.5 mg once a day.  *If you need a refill on your cardiac medications before your next appointment, please call your pharmacy*   Lab Work: None Ordered If you have labs (blood work) drawn today and your tests are completely normal, you will receive your results only by: MyChart Message (if you have MyChart) OR A paper copy in the mail If you have any lab test that is abnormal or we need to change your treatment, we will call you to review the results.   Testing/Procedures: None Ordered   Follow-Up: At [facility], you and your health needs are our priority.  As part of our continuing mission to provide you with exceptional heart care, we have created designated Provider Care Teams.  These Care Teams include your primary Cardiologist (physician) and Advanced Practice Providers (APPs -  Physician Assistants and Nurse Practitioners) who all work together to provide you with the care you need, when you need it.  We recommend signing up for the patient portal called "MyChart".  Sign up information is provided on this After Visit Summary.  MyChart is used to connect with patients for Virtual Visits (Telemedicine).  Patients are able to view lab/test results, encounter notes, upcoming appointments, etc.  Non-urgent messages can be sent to your provider as well.   To learn more about what you can do with MyChart, go to [email or web address].    Your next appointment:   3 month follow up

## 2023-04-09 NOTE — Progress Notes (Signed)
Cardiology Consultation:    Date:  04/09/2023   ID:  JABRIAH LEDET, DOB 03-29-1945, MRN [id number]  PCP:  [name], MD  Cardiologist:  Marlyn Corporal Athalia Setterlund, MD   Referring MD: [name], MD   No chief complaint on file.    ASSESSMENT AND PLAN:   Ms. Nolt 78 year old woman with history of coronary artery disease with progressive dyspnea s/p abnormal CT coronary angiogram normal 2024, PCI of mid LAD 03/08/2023, redo cardiac cath 03/27/2023 with patent mid LAD stent, nonobstructive ostial RCA disease, ischemic changes on CT head dysfunction with CFR of 1.8 and LVEDP 25 mmHg. Also has normal biventricular function on echocardiogram, mild mitral stenosis, mild aortic insufficiency, hypertension, hyperlipidemia, hypothyroidism, irritable bowel syndrome, hiatal hernia s/p unsuccessful repair, seizure disorder, chronic thrombocytopenia, gastroparesis, NASH, chronic dizziness, chronic microvascular ischemic changes on CT head September 2024, hemorrhoids, palpitations with no significant abnormalities on event monitor October 2024.  Problem List Items Addressed This Visit     CAD (coronary artery disease)  CT Cardiac 01/19/2023 calcium score 411, plaque volume 366, moderate stenosis LAD, mild stenosis D1, RCA, LCx/OM 3; S/p PCI of Mid LAD 03/08/2023;rpt cath 03/27/2023 stable - Primary   Recent PCI of LAD in December. With recurrent symptoms of chest discomfort concerning for unstable angina had redo CABG 03/27/2023 with stable disease, microvascular coronary dysfunction suggested by CFR 1.8 and mildly elevated LVEDP 25 mmHg.  At this time no acute worsening of symptoms but she does have mild chest discomfort with exertion.  Will escalate antianginal regimen. Also recommended low-salt diet to avoid fluid overload.  Continue dual antiplatelet therapy with aspirin 81 mg once daily and Plavix 75 mg once daily.  Continue rosuvastatin 20 mg daily and ezetimibe 10 mg once daily.  For  antianginal regimen continue carvedilol 3.125 mg twice daily. Continue Imdur 30 mg once daily.  She has mild headache but appears to be tolerating well, recommended to continue with the same at the current dose and expect headache symptoms to resolve over time. Escalate therapy with adding amlodipine 2.5 mg once daily.  In order to avoid low blood pressures we will discontinue hydrochlorothiazide and lower dose of losartan to 25 mg once daily.  Cardiac rehab as currently scheduled.  Will follow-up in the clinic tentatively in 3 months.      Relevant Medications   losartan (COZAAR) 25 MG tablet   amLODipine (NORVASC) 2.5 MG tablet      History of Present Illness:    ELANAH PRESTON is a 78 y.o. female who is being seen today for follow-up visit. Last visit with me in the office was 03-05-2023 PCP is [facility], Audrie Gallus, MD.   Has history of coronary artery disease initially with progressive symptoms of dyspnea on exertion, underwent CT coronary angiogram 01-19-2023 noted LAD disease poorly moderate for FFR assessment subsequently underwent cardiac catheterization 03/08/2023 and PCI of severe mid LAD stenosis with a drug-eluting stent and noted to have moderate stenosis of ostial RCA nonflow limiting.  Subsequently January 2025 she was admitted to the hospital for symptoms of chest pain diagnosed with unstable angina and underwent cardiac catheterization 03-27-2023 that noted patent mid LAD stent, ostial RCA disease was assessed with RFR which was not significant 0.99.  Coronary microvascular dysfunction was noted with CFR of 1.8.  LVEDP was noted 25 mmHg.  She also has a history of mild mitral stenosis, mild aortic insufficiency, hypertension, hyperlipidemia, hypothyroidism, irritable bowel disease, hiatal hernia s/p unsuccessful repair, seizure  disorder, chronic thrombocytopenia, gastroparesis, NASH, chronic dizziness, chronic microvascular ischemic changes on CT of the head in September 2024,  hemorrhoids, palpitations without significant abnormalities on event monitor October 2024   Here for the by herself.  Mentions she continues to feel mild chest discomfort that she describes as on the left side of the chest radiating to the back which has been persistent for the last few weeks.  Mentions associated with mild lightheadedness on changing position first thing in the morning.  She is scheduled to begin cardiac rehabilitation tomorrow. Denies any shortness of breath, orthopnea, paroxysmal nocturnal dyspnea.  Denies any pedal edema.  Mentions has been compliant with all her medications consistently.  Past Medical History:  Diagnosis Date   Allergic rhinitis 08/22/2006   Qualifier: Diagnosis of   By: Lindwood Qua CMA, Jerl Santos      IMO SNOMED Dx Update Oct 2024     Allergy    allegic rhinitis   Alternating constipation and diarrhea 08/12/2015   Anemia    Angiodysplasia of colon    Anxiety    Arthritis    AVM (arteriovenous malformation) of colon    Bilateral knee pain 05/11/2022   Blurred vision, right eye 08/11/2021   Patient does have blurred vision right eye,     Borderline diabetic    per patient medical history form   Branch retinal vein occlusion with macular edema of left eye 07/14/2019   CME superior to the fovea OS has worsened at 7-week interval from macular branch retinal vein occlusion, repeat Avastin today and evaluate exam in 6 weeks     Breast cancer (HCC) 02/2009   breast CA L invasive ductal CA(hormone receptor positive.)   CAD (coronary artery disease)  CT Cardiac 01/19/2023 calcium score 411, plaque volume 366, moderate stenosis LAD, mild stenosis D1, RCA, LCx/OM 3 02/01/2023   Chest pain 12/29/2022   Chronic idiopathic thrombocytopenia (HCC) 03/23/2023   Colon cancer screening 01/23/2014   Colon polyp    hyperplastic   Colon stricture (HCC) 01/14/2003   Cystoid macular edema of left eye 07/14/2019   Decreased calculated GFR 05/11/2022   Diabetic  gastroparesis (HCC) 03/27/2016   Typical symptoms -suspect      No detectable diabetic retinopathy 12-27-2020     Diabetic neuropathy (HCC) 11/25/2013   Diaphragmatic hernia 03/02/2008   Qualifier: Diagnosis of   By: Koleen Distance CMA Duncan Dull), Leisha      IMO SNOMED Dx Update Oct 2024     Diverticulitis of colon 10/14/2014   Diverticulosis    Dizziness 01/07/2014   Dyspnea on exertion 01/04/2023   Encounter for Medicare annual wellness exam 01/23/2014   ESOPHAGEAL STRICTURE 03/02/2008   Qualifier: Diagnosis of   By: Koleen Distance CMA (AAMA), Hulan Saas         Essential hypertension 08/22/2006   Qualifier: Diagnosis of   By: Lindwood Qua CMA, Jerl Santos         Estrogen deficiency 01/23/2014   Eye pain 03/23/2023   Fatigue 05/20/2018   Fatty liver    Fever, low grade 09/24/2020   GAD (generalized anxiety disorder) 04/15/2007   Qualifier: Diagnosis of   By: Jillyn Hidden FNP, Mcarthur Rossetti        GI bleed    Gout    Hepatic steatosis    Hiatal hernia    Hip pain, bilateral 08/31/2017   History of breast cancer 08/02/2009   Qualifier: Diagnosis of   By: [name] MD, Colon Flattery        History of  CAD (coronary artery disease) 03/23/2023   History of COVID-19 09/24/2020   History of removal of implants of both breasts 07/01/2013   06/25/13 elected to have bilateral breast implants removed.     History of seizure 08/22/2006   Qualifier: Diagnosis of   By: Lindwood Qua CMA, Jerl Santos         Hyperlipidemia    Hyperlipidemia associated with type 2 diabetes mellitus (HCC) 08/22/2006   Qualifier: Diagnosis of   By: Lindwood Qua CMA, Jerl Santos         Hypertension    Hypertensive retinopathy of left eye 07/14/2019   Hypothyroidism    IBS (irritable bowel syndrome)    Lap Nissen October 2013 01/19/2012   Laparoscopic takedown of large type III mixed hiatus hernia with primary closure of the diaphragm with 3 sutures posteriorally (post 2 with pledgets) and Nissen fundoplication over a #56 lighted bougie        Large type  III mixed hiatus hernia  12/06/2011   Plan surgery to repair diaphragm and do Nissen fundoplication     Macular edema    left eye   Melena 01/2008   anemia transfusion   Menopause    per patient medical history form   Mitral stenosis and aortic incompetence, by TTE 01/25/2023 02/01/2023   Non-insulin treated type 2 diabetes mellitus (HCC) 07/25/2010   Ask patient to clarify. Type not noted on medical history form.     Palpitations 01/04/2023   Peritoneal adhesions 03/02/2008   Qualifier: Diagnosis of   By: Koleen Distance CMA Duncan Dull), Hulan Saas      IMO SNOMED Dx Update Oct 2024     Poor balance 01/30/2023   Poorly controlled type 2 diabetes mellitus with neuropathy (HCC) 08/19/2007   Qualifier: Diagnosis of   By: [name] MD, Colon Flattery        Posterior vitreous detachment of right eye 11/06/2019   Postmenopausal hormone therapy    per patient medical history form.   Retinal vein occlusion, branch 01/03/2016   Left with hemorrhage and edema (Dr Luciana Axe) 2017     Routine general medical examination at a health care facility 07/11/2011   Salmonella 05/2000   renal effects secondary to dehydration   Seizure disorder (HCC)    Seizures (HCC)    one seizure several yrs ago --etiology unknown-no problem since   Stress reaction 10/25/2011   SYNDROME, CARPAL TUNNEL 08/23/2006   Qualifier: Diagnosis of   By: [name] MD, Colon Flattery        SYNDROME, RESTLESS LEGS 08/23/2006   Qualifier: Diagnosis of   By: [name] MD, Colon Flattery        Thrombocytopenia (HCC) 11/25/2013   TRANSAMINASES, SERUM, ELEVATED 11/11/2009   Qualifier: Diagnosis of   By: [name] MD, Colon Flattery        Tremor 01/20/2014   Head and right hand      Vitamin B12 deficiency 09/21/2016   Vitreous floaters of left eye 07/14/2019   The nature of vitreous floaters was discussed with the patient as well as their frequent occurrence with development of posterior vitreous detachment. The significance of flashes and field cuts was discussed with the  patient. The need for a dilated fundus examination was discussed and advice given to return immediately for new or different floaters .  More uncommonly, vitreous floaters, debris, or    Past Surgical History:  Procedure Laterality Date   ABDOMINAL HYSTERECTOMY  1983   BREAST SURGERY  02/2009   breast biopsy invasive ductal  CA-bilateral mastectomies   CORONARY PRESSURE/FFR STUDY N/A 03/27/2023   Procedure: CORONARY PRESSURE/FFR STUDY;  Surgeon: Orbie Pyo, MD;  Location: [facility];  Service: Cardiovascular;  Laterality: N/A;   CORONARY STENT INTERVENTION N/A 03/08/2023   Procedure: CORONARY STENT INTERVENTION;  Surgeon: Kathleene Hazel, MD;  Location: [facility];  Service: Cardiovascular;  Laterality: N/A;   EPIGASTRIC HERNIA REPAIR  01/02/2012   Procedure: HERNIA REPAIR EPIGASTRIC ADULT;  Surgeon: [name], MD;  Location: [facility];  Service: General;  Laterality: N/A;  Laparoscopic Repair Paraesophageal Hernia, Nissen   EYE SURGERY Left 07/2017   Dr. Elwyn Lade CYST EXCISION     LAPAROSCOPIC NISSEN FUNDOPLICATION  01/02/2012   Procedure: LAPAROSCOPIC NISSEN FUNDOPLICATION;  Surgeon: [name], MD;  Location: [facility];  Service: General;  Laterality: N/A;   LEFT HEART CATH AND CORONARY ANGIOGRAPHY N/A 03/08/2023   Procedure: LEFT HEART CATH AND CORONARY ANGIOGRAPHY;  Surgeon: Kathleene Hazel, MD;  Location: [facility];  Service: Cardiovascular;  Laterality: N/A;   LEFT HEART CATH AND CORONARY ANGIOGRAPHY N/A 03/27/2023   Procedure: LEFT HEART CATH AND CORONARY ANGIOGRAPHY;  Surgeon: Orbie Pyo, MD;  Location: [facility];  Service: Cardiovascular;  Laterality: N/A;   MASTECTOMY  04/2009   Bilateral for ductal carcinoma on L   OVARY SURGERY  1986   ovarian tumor removed   TUBAL LIGATION      Current Medications: Current Meds  Medication Sig   amLODipine (NORVASC) 2.5 MG tablet Take 1 tablet (2.5 mg total) by  mouth daily.   aspirin EC 81 MG tablet Take 1 tablet (81 mg total) by mouth daily. Swallow whole.   Calcium Carbonate (CALCIUM 500 PO) Take 500 mg by mouth daily.   carvedilol (COREG) 3.125 MG tablet Take 1 tablet (3.125 mg total) by mouth 2 (two) times daily with a meal.   Cholecalciferol (VITAMIN D3) 1000 units CAPS Take 1,000 Units by mouth daily.   clopidogrel (PLAVIX) 75 MG tablet Take 1 tablet (75 mg total) by mouth daily.   Cyanocobalamin (B-12) 1000 MCG TABS Take 1,000 mcg by mouth daily.   Dulaglutide (TRULICITY) 3 MG/0.5ML SOPN INJECT 0.5 ML (3 MG) UNDER THE SKIN ONCE A WEEK   empagliflozin (JARDIANCE) 25 MG TABS tablet Take 1 tablet (25 mg total) by mouth daily before breakfast.   ezetimibe (ZETIA) 10 MG tablet TAKE 1 TABLET DAILY   famotidine (PEPCID) 20 MG tablet Take 1 tablet (20 mg total) by mouth 2 (two) times daily.   FLUoxetine (PROZAC) 40 MG capsule Take 1 capsule (40 mg total) by mouth daily.   fluticasone (FLONASE) 50 MCG/ACT nasal spray USE 2 SPRAYS IN EACH NOSTRIL DAILY AS NEEDED FOR ALLERGIES   isosorbide mononitrate (IMDUR) 30 MG 24 hr tablet Take 1 tablet (30 mg total) by mouth daily.   levothyroxine (SYNTHROID) 50 MCG tablet TAKE 1 TABLET DAILY BEFORE BREAKFAST   losartan (COZAAR) 25 MG tablet Take 1 tablet (25 mg total) by mouth daily.   Multiple Vitamin (MULTIVITAMIN WITH MINERALS) TABS tablet Take 1 tablet by mouth daily.   nitroGLYCERIN (NITROSTAT) 0.4 MG SL tablet Place 1 tablet (0.4 mg total) under the tongue every 5 (five) minutes as needed for chest pain.   pantoprazole (PROTONIX) 20 MG tablet Take 1 tablet (20 mg total) by mouth daily.   repaglinide (PRANDIN) 1 MG tablet TAKE 1 TABLET DAILY BEFORE SUPPER   rOPINIRole (REQUIP) 1 MG tablet  TAKE 1 TABLET AT BEDTIME   rosuvastatin (CRESTOR) 20 MG tablet Take 1 tablet (20 mg total) by mouth daily.   [DISCONTINUED] hydrochlorothiazide (HYDRODIURIL) 25 MG tablet TAKE 1 TABLET DAILY   [DISCONTINUED] losartan  (COZAAR) 100 MG tablet Take 0.5 tablets (50 mg total) by mouth daily.     Allergies:   Arimidex [anastrozole], Glipizide, Metformin and related, and Penicillins   Social History   Socioeconomic History   Marital status: Married    Spouse name: Not on file   Number of children: 3   Years of education: Not on file   Highest education level: Not on file  Occupational History   Occupation: retired    Associate Professor: UNEMPLOYED  Tobacco Use   Smoking status: Never   Smokeless tobacco: Never  Vaping Use   Vaping status: Never Used  Substance and Sexual Activity   Alcohol use: Yes    Comment: occasional   Drug use: Never   Sexual activity: Not Currently  Other Topics Concern   Not on file  Social History Narrative   ** Merged History Encounter **       1 cup of coffee every morning   Social Drivers of Health   Financial Resource Strain: Low Risk  (02/28/2023)   Overall Financial Resource Strain (CARDIA)    Difficulty of Paying Living Expenses: Not hard at all  Food Insecurity: No Food Insecurity (03/24/2023)   Hunger Vital Sign    Worried About Running Out of Food in the Last Year: Never true    Ran Out of Food in the Last Year: Never true  Transportation Needs: No Transportation Needs (03/24/2023)   PRAPARE - Administrator, Civil Service (Medical): No    Lack of Transportation (Non-Medical): No  Physical Activity: Inactive (02/28/2023)   Exercise Vital Sign    Days of Exercise per Week: 0 days    Minutes of Exercise per Session: 0 min  Stress: Stress Concern Present (02/28/2023)   [employer] of Occupational Health - Occupational Stress Questionnaire    Feeling of Stress : Rather much  Social Connections: Socially Integrated (03/24/2023)   Social Connection and Isolation Panel [NHANES]    Frequency of Communication with Friends and Family: Twice a week    Frequency of Social Gatherings with Friends and Family: Once a week    Attends Religious Services: More  than 4 times per year    Active Member of [employer] or Organizations: Yes    Attends Engineer, structural: 1 to 4 times per year    Marital Status: Married     Family History: The patient's family history includes Breast cancer in her mother, sister, and sister; Diabetes in her brother, father, and sister; Esophageal cancer in her father; Heart attack in her father; Hypertension in her brother, mother, and sister; Stroke in her mother. ROS:   Please see the history of present illness.    All 14 point review of systems negative except as described per history of present illness.  EKGs/Labs/Other Studies Reviewed:    The following studies were reviewed today: Cardiac Studies & Procedures   CARDIAC CATHETERIZATION  CARDIAC CATHETERIZATION 03/27/2023  Narrative   Mid LAD-1 lesion is 30% stenosed.   Mid LAD-2 lesion is 50% stenosed.   Mid Cx lesion is 20% stenosed.   Ost RCA to Prox RCA lesion is 50% stenosed.   2nd Mrg lesion is 20% stenosed.   Non-stenotic Mid LAD-3 lesion was previously treated.  1.  Patent mid LAD stent with mild proximal and mid disease relatively unchanged versus prior study. 2.  Ostial right coronary artery disease which is modest angiographically; RFR was 0.99. 3.  Coronary microvascular dysfunction with CFR of 1.8 (abnormal being less than 2-2.5) however IMR is normal at 9 (normal being less than 25) 4.  LVEDP of 25 mmHg. 5.  Right upper extremity tortuosity requiring 75 cm destination sheath for coronary artery cannulation.  Recommendation: Medical therapy.  The results were reviewed with the team be attending Dr. Izora Ribas.  Findings Coronary Findings Diagnostic  Dominance: Right  Left Anterior Descending Vessel is large. Mid LAD-1 lesion is 30% stenosed. Mid LAD-2 lesion is 50% stenosed. Non-stenotic Mid LAD-3 lesion was previously treated.  Left Circumflex Vessel is large. Mid Cx lesion is 20% stenosed.  Second Obtuse Marginal  Branch Vessel is moderate in size. 2nd Mrg lesion is 20% stenosed.  Right Coronary Artery Vessel is large. Ost RCA to Prox RCA lesion is 50% stenosed.  Intervention  No interventions have been documented.   CARDIAC CATHETERIZATION  CARDIAC CATHETERIZATION 03/08/2023  Narrative   Ost RCA to Prox RCA lesion is 50% stenosed.   2nd Mrg lesion is 20% stenosed.   Mid Cx lesion is 20% stenosed.   Mid LAD-1 lesion is 30% stenosed.   Mid LAD-2 lesion is 50% stenosed.   Mid LAD-3 lesion is 99% stenosed.   A drug-eluting stent was successfully placed using a SYNERGY XD 2.25X16.   Post intervention, there is a 0% residual stenosis.  Severe mid LAD stenosis Successful PTCA/DES x 1 mid LAD Moderate stenosis ostial RCA. This does not appear to be flow limiting.  Recommendations: Continue DAPT with ASA and Plavix for at least six months. Same day post PCI discharge.  Findings Coronary Findings Diagnostic  Dominance: Right  Left Anterior Descending Vessel is large. Mid LAD-1 lesion is 30% stenosed. Mid LAD-2 lesion is 50% stenosed. Mid LAD-3 lesion is 99% stenosed.  Left Circumflex Vessel is large. Mid Cx lesion is 20% stenosed.  Second Obtuse Marginal Branch Vessel is moderate in size. 2nd Mrg lesion is 20% stenosed.  Right Coronary Artery Vessel is large. Ost RCA to Prox RCA lesion is 50% stenosed.  Intervention  Mid LAD-3 lesion Stent [place] GUIDE 6FR XBLAD3.5 guide catheter was inserted. Lesion crossed with guidewire using a WIRE ASAHI PROWATER 180CM. Pre-stent angioplasty was performed using a BALLN EMERGE MR 2.0X12. A drug-eluting stent was successfully placed using a SYNERGY XD 2.25X16. Stent strut is well apposed. Post-stent angioplasty was performed using a BALLN [place] EMERGE MR 2.5X12. Post-Intervention Lesion Assessment The intervention was successful. Pre-interventional TIMI flow is 3. Post-intervention TIMI flow is 3. No complications occurred at this  lesion. There is a 0% residual stenosis post intervention.    ECHOCARDIOGRAM  ECHOCARDIOGRAM LIMITED 03/24/2023  Narrative ECHOCARDIOGRAM LIMITED REPORT    Patient Name:   FERN FLATEN Date of Exam: 03/24/2023 Medical Rec #:  [id number]        Height:       62.0 in Accession #:    [id number]       Weight:       143.0 lb Date of Birth:  11/11/1945         BSA:          1.658 m Patient Age:    77 years         BP:           104/95 mmHg Patient Gender: F  HR:           71 bpm. Exam Location:  Inpatient  Procedure: Limited Echo  Indications:     LVEF  History:         Patient has prior history of Echocardiogram examinations, most recent 01/25/2023. CAD; Risk Factors:Hypertension and Diabetes.  Sonographer:     Darlys Gales Referring Phys:  [id number] SUBRINA SUNDIL Diagnosing Phys: Dina Rich MD  IMPRESSIONS   1. Left ventricular ejection fraction, by estimation, is >75%. The left ventricle has hyperdynamic function. The left ventricle has no regional wall motion abnormalities. 2. The aortic valve is tricuspid. There is moderate calcification of the aortic valve. There is moderate thickening of the aortic valve. 3. IVC is small suggesting low RA pressure and hypovolemia 4. Limited echo to evaluate LV function  FINDINGS Left Ventricle: Left ventricular ejection fraction, by estimation, is >75%. The left ventricle has hyperdynamic function. The left ventricle has no regional wall motion abnormalities.  Aortic Valve: The aortic valve is tricuspid. There is moderate calcification of the aortic valve. There is moderate thickening of the aortic valve. There is moderate aortic valve annular calcification.  LEFT VENTRICLE PLAX 2D LVIDd:         4.00 cm LVIDs:         1.90 cm LV PW:         0.80 cm LV IVS:        0.90 cm LVOT diam:     1.80 cm LVOT Area:     2.54 cm   LEFT ATRIUM             Index LA Vol (A2C):   38.2 ml 23.04 ml/m LA Vol (A4C):   30.5 ml  18.40 ml/m LA Biplane Vol: 34.9 ml 21.05 ml/m  AORTA Ao Root diam: 2.90 cm Ao Asc diam:  3.50 cm   SHUNTS Systemic Diam: 1.80 cm  Dina Rich MD Electronically signed by Dina Rich MD Signature Date/Time: 03/24/2023/2:22:13 PM    Final (Updated)   MONITORS  LONG TERM MONITOR (3-14 DAYS) 01/26/2023  Narrative   Sinus rhythm predominantly with average heart rate 80/min, rare supraventricular and ventricular ectopy burden.   Short runs of supraventricular tachycardia appear consistent with ectopic atrial runs, longest episode lasted 8 beats.   Two patient triggered events in total, correlated with normal sinus rhythm and rate.   Overall benign study.   Patch Wear Time:  7 days and 16 hours (2024-10-17T16:34:29-0400 to 2024-10-25T08:46:09-398)  Predominantly sinus rhythm. Average heart rate 80/min [ranging from 60 bpm to 143 bpm]. Rare supraventricular ectopy, less than 1% burden Rare ventricular ectopy, less than 1% burden. Short runs of supraventricular tachycardia occurred with the longest episode lasting 8 beats with an average rate of 113 bpm, these appear consistent with ectopic atrial tachycardia. Total patient triggers were two and reported without any reported symptoms, correlated with sinus rhythm with normal heart rate ranging from 71 bpm to 81 bpm Overall benign study.  CT SCANS  CT CORONARY MORPH W/CTA COR W/SCORE 01/19/2023  Addendum 02/10/2023 10:06 PM ADDENDUM REPORT: 02/10/2023 22:04  EXAM: OVER-READ INTERPRETATION  CT CHEST  The following report is an over-read performed by radiologist Dr. Curly Shores [facility] on 02/10/2023. This over-read does not include interpretation of cardiac or coronary anatomy or pathology. The coronary CTA interpretation by the cardiologist is attached.  COMPARISON:  07/01/2015.  FINDINGS: Cardiovascular:  See findings discussed in the body of the report.  Mediastinum/Nodes: No suspicious  adenopathy  identified. Imaged mediastinal structures are unremarkable.  Lungs/Pleura: Basilar interstitial changes consistent with scarring or subsegmental atelectasis. No pleural effusion or pneumothorax.  Upper Abdomen: Small hiatal hernia.  Musculoskeletal: Bilateral mastectomy. No acute osseous findings. There are thoracic degenerative changes.  IMPRESSION: 1. Basilar scarring or subsegmental atelectasis. 2. Small hiatal hernia.   Electronically Signed By: Layla Maw M.D. On: 02/10/2023 22:04  Narrative CLINICAL DATA:  Chest pain  EXAM: Cardiac/Coronary CTA  TECHNIQUE: A non-contrast, gated CT scan was obtained with axial slices of 3 mm through the heart for calcium scoring. Calcium scoring was performed using the Agatston method. A 120 kV prospective, gated, contrast cardiac scan was obtained. Gantry rotation speed was 250 msecs and collimation was 0.6 mm. Two sublingual nitroglycerin tablets (0.8 mg) were given. The 3D data set was reconstructed in 5% intervals of the 35-75% of the R-R cycle. Diastolic phases were analyzed on a dedicated workstation using MPR, MIP, and VRT modes. The patient received 95 cc of contrast.  FINDINGS: Image quality: Good.  Noise artifact is: Limited.  Coronary Arteries:  Normal coronary origin.  Right dominance.  Left main: The left main is a large caliber vessel with a normal take off from the left coronary cusp that bifurcates to form a left anterior descending artery and a left circumflex artery. There is no plaque or stenosis.  Left anterior descending artery: The LAD gives off 2 patent diagonal branches. There is mild calcified plaque in the proximal LAD with associated stenosis of 25-49%. There is mild calcified plaque in a large D1 with associated stenosis of 25-49%. There is moderate mixed plaque in the mid LAD with associated stenosis of 50-69%.  Left circumflex artery: The LCX is non-dominant and gives off  3 patent obtuse marginal branches. There is mild mixed plaque in the proximal and mid LCx and OM3 with associated stenosis of 25-49%.  Right coronary artery: The RCA is dominant with normal take off from the right coronary cusp. The RCA terminates as a PDA and right posterolateral branch. There is mild soft plaque in the ostium of the RCA with associated stenosis of at least 25-49% but could be >50%. There is minimal calcified plaque in the distal RCA with associated stenosis of < 25%.  Right Atrium: Right atrial size is within normal limits.  Right Ventricle: The right ventricular cavity is within normal limits.  Left Atrium: Left atrial size is normal in size with no left atrial appendage filling defect.  Left Ventricle: The ventricular cavity size is within normal limits.  Pulmonary arteries: Normal in size.  Pulmonary veins: Normal pulmonary venous drainage.  Pericardium: Normal thickness without significant effusion or calcium present.  Cardiac valves: The aortic valve is trileaflet with calcifications and with AV calcium score of 174. The mitral valve is normal with mitral annular calcification.  Aorta: Normal caliber with scattered calcifications.  Extra-cardiac findings: See attached radiology report for non-cardiac structures.  IMPRESSION: 1. Coronary calcium score of 411. This was 83rd percentile for age-, sex, and race-matched controls.  2. Total plaque volume 366 mm3 which is 63rd percentile for age- and sex-matched controls (calcified plaque 54mm3; non-calcified plaque 369mm3). TPV is moderate.  3. Normal coronary origin with right dominance.  4. Moderate atherosclerosis. 25-49% prox LAD/D1; 50-69% mid LAD; 25-49% ostial RCA; 25-49% prox LCx/OM3.  5. Consider symptom-guided anti-ischemic and preventive pharmacotherapy as well as risk factor modification per guideline-directed care.  6. This study has been submitted for FFR analysis of the ostial  RCA.  RECOMMENDATIONS: 1. CAD-RADS 0: No evidence of CAD (0%). Consider non-atherosclerotic causes of chest pain.  2. CAD-RADS 1: Minimal non-obstructive CAD (0-24%). Consider non-atherosclerotic causes of chest pain. Consider preventive therapy and risk factor modification.  3. CAD-RADS 2: Mild non-obstructive CAD (25-49%). Consider non-atherosclerotic causes of chest pain. Consider preventive therapy and risk factor modification.  4. CAD-RADS 3: Moderate stenosis. Consider symptom-guided anti-ischemic pharmacotherapy as well as risk factor modification per guideline directed care. Additional analysis with CT FFR will be submitted.  5. CAD-RADS 4: Severe stenosis. (70-99% or > 50% left main). Cardiac catheterization or CT FFR is recommended. Consider symptom-guided anti-ischemic pharmacotherapy as well as risk factor modification per guideline directed care. Invasive coronary angiography recommended with revascularization per published guideline statements.  6. CAD-RADS 5: Total coronary occlusion (100%). Consider cardiac catheterization or viability assessment. Consider symptom-guided anti-ischemic pharmacotherapy as well as risk factor modification per guideline directed care.  7. CAD-RADS N: Non-diagnostic study. Obstructive CAD can't be excluded. Alternative evaluation is recommended.  Armanda Magic, MD  Electronically Signed: By: Armanda Magic M.D. On: 01/19/2023 11:02          EKG:       Recent Labs: 05/04/2022: TSH 4.03 03/24/2023: ALT 23; B Natriuretic Peptide 43.0; Hemoglobin 12.2; Platelets 128 03/27/2023: BUN 22; Creatinine, Ser 1.29; Potassium 3.8; Sodium 139  Recent Lipid Panel    Component Value Date/Time   CHOL 112 05/04/2022 0909   TRIG 209.0 (H) 05/04/2022 0909   HDL 33.10 (L) 05/04/2022 0909   CHOLHDL 3 05/04/2022 0909   VLDL 41.8 (H) 05/04/2022 0909   LDLCALC 52 05/04/2021 0901   LDLDIRECT 55.0 05/04/2022 0909    Physical Exam:    VS:  BP  108/62   Pulse 74   Ht 5\' 2"  (1.575 m)   Wt 145 lb 6.4 oz (66 kg)   LMP 03/20/1981   SpO2 96%   BMI 26.59 kg/m     Wt Readings from Last 3 Encounters:  04/09/23 145 lb 6.4 oz (66 kg)  04/02/23 145 lb 6 oz (65.9 kg)  03/27/23 140 lb 14 oz (63.9 kg)     GENERAL:  Well nourished, well developed in no acute distress NECK: No JVD; No carotid bruits CARDIAC: RRR, S1 and S2 present, no murmurs, no rubs, no gallops CHEST:  Clear to auscultation without rales, wheezing or rhonchi  Extremities: No pitting pedal edema. Pulses bilaterally symmetric with radial 2+ and dorsalis pedis 2+.  Right radial cath access site normal. NEUROLOGIC:  Alert and oriented x 3  Medication Adjustments/Labs and Tests Ordered: Current medicines are reviewed at length with the patient today.  Concerns regarding medicines are outlined above.  No orders of the defined types were placed in this encounter.  Meds ordered this encounter  Medications   losartan (COZAAR) 25 MG tablet    Sig: Take 1 tablet (25 mg total) by mouth daily.    Dispense:  90 tablet    Refill:  3   amLODipine (NORVASC) 2.5 MG tablet    Sig: Take 1 tablet (2.5 mg total) by mouth daily.    Dispense:  90 tablet    Refill:  3    Signed, Darnice Comrie reddy Vauda Salvucci, MD, MPH, [facility]. 04/09/2023 10:47 AM    [facility]

## 2023-04-11 DIAGNOSIS — I444 Left anterior fascicular block: Secondary | ICD-10-CM | POA: Diagnosis not present

## 2023-04-11 DIAGNOSIS — R079 Chest pain, unspecified: Secondary | ICD-10-CM | POA: Diagnosis not present

## 2023-04-11 DIAGNOSIS — E860 Dehydration: Secondary | ICD-10-CM | POA: Diagnosis not present

## 2023-04-11 DIAGNOSIS — R112 Nausea with vomiting, unspecified: Secondary | ICD-10-CM | POA: Diagnosis not present

## 2023-04-11 DIAGNOSIS — R0602 Shortness of breath: Secondary | ICD-10-CM | POA: Diagnosis not present

## 2023-04-18 DIAGNOSIS — G2581 Restless legs syndrome: Secondary | ICD-10-CM | POA: Diagnosis not present

## 2023-04-18 DIAGNOSIS — Z955 Presence of coronary angioplasty implant and graft: Secondary | ICD-10-CM | POA: Diagnosis not present

## 2023-04-18 DIAGNOSIS — I2510 Atherosclerotic heart disease of native coronary artery without angina pectoris: Secondary | ICD-10-CM | POA: Diagnosis not present

## 2023-04-18 DIAGNOSIS — E785 Hyperlipidemia, unspecified: Secondary | ICD-10-CM | POA: Diagnosis not present

## 2023-04-18 DIAGNOSIS — I129 Hypertensive chronic kidney disease with stage 1 through stage 4 chronic kidney disease, or unspecified chronic kidney disease: Secondary | ICD-10-CM | POA: Diagnosis not present

## 2023-04-20 DIAGNOSIS — Z955 Presence of coronary angioplasty implant and graft: Secondary | ICD-10-CM | POA: Diagnosis not present

## 2023-04-20 DIAGNOSIS — I2510 Atherosclerotic heart disease of native coronary artery without angina pectoris: Secondary | ICD-10-CM | POA: Diagnosis not present

## 2023-04-20 DIAGNOSIS — I129 Hypertensive chronic kidney disease with stage 1 through stage 4 chronic kidney disease, or unspecified chronic kidney disease: Secondary | ICD-10-CM | POA: Diagnosis not present

## 2023-04-20 DIAGNOSIS — G2581 Restless legs syndrome: Secondary | ICD-10-CM | POA: Diagnosis not present

## 2023-04-20 DIAGNOSIS — E785 Hyperlipidemia, unspecified: Secondary | ICD-10-CM | POA: Diagnosis not present

## 2023-04-22 ENCOUNTER — Other Ambulatory Visit: Payer: Self-pay | Admitting: Family Medicine

## 2023-04-22 DIAGNOSIS — E039 Hypothyroidism, unspecified: Secondary | ICD-10-CM

## 2023-04-23 DIAGNOSIS — G2581 Restless legs syndrome: Secondary | ICD-10-CM | POA: Diagnosis not present

## 2023-04-23 DIAGNOSIS — I2510 Atherosclerotic heart disease of native coronary artery without angina pectoris: Secondary | ICD-10-CM | POA: Diagnosis not present

## 2023-04-23 DIAGNOSIS — E785 Hyperlipidemia, unspecified: Secondary | ICD-10-CM | POA: Diagnosis not present

## 2023-04-23 DIAGNOSIS — Z955 Presence of coronary angioplasty implant and graft: Secondary | ICD-10-CM | POA: Diagnosis not present

## 2023-04-23 DIAGNOSIS — I129 Hypertensive chronic kidney disease with stage 1 through stage 4 chronic kidney disease, or unspecified chronic kidney disease: Secondary | ICD-10-CM | POA: Diagnosis not present

## 2023-04-23 NOTE — Telephone Encounter (Signed)
I refilled once Please schedule a non fasting lab appointment for this month when able for TSH The order is in

## 2023-04-23 NOTE — Telephone Encounter (Signed)
Pt had a recent hospital f/u but last TSH lab was 05/04/22, no futue appts., please advise

## 2023-04-24 ENCOUNTER — Other Ambulatory Visit: Payer: Self-pay

## 2023-04-24 MED ORDER — LOSARTAN POTASSIUM 25 MG PO TABS: 25.0000 mg | ORAL_TABLET | Freq: Every day | ORAL | 2 refills | Status: DC

## 2023-04-24 MED ORDER — AMLODIPINE BESYLATE 2.5 MG PO TABS: 2.5000 mg | ORAL_TABLET | Freq: Every day | ORAL | 2 refills | Status: DC

## 2023-04-24 NOTE — Telephone Encounter (Signed)
Spoke to pt, scheduled labs for 05/08/23

## 2023-04-30 DIAGNOSIS — I2510 Atherosclerotic heart disease of native coronary artery without angina pectoris: Secondary | ICD-10-CM | POA: Diagnosis not present

## 2023-04-30 DIAGNOSIS — G2581 Restless legs syndrome: Secondary | ICD-10-CM | POA: Diagnosis not present

## 2023-04-30 DIAGNOSIS — E785 Hyperlipidemia, unspecified: Secondary | ICD-10-CM | POA: Diagnosis not present

## 2023-04-30 DIAGNOSIS — Z955 Presence of coronary angioplasty implant and graft: Secondary | ICD-10-CM | POA: Diagnosis not present

## 2023-04-30 DIAGNOSIS — I129 Hypertensive chronic kidney disease with stage 1 through stage 4 chronic kidney disease, or unspecified chronic kidney disease: Secondary | ICD-10-CM | POA: Diagnosis not present

## 2023-05-02 DIAGNOSIS — I2510 Atherosclerotic heart disease of native coronary artery without angina pectoris: Secondary | ICD-10-CM | POA: Diagnosis not present

## 2023-05-02 DIAGNOSIS — G2581 Restless legs syndrome: Secondary | ICD-10-CM | POA: Diagnosis not present

## 2023-05-02 DIAGNOSIS — E785 Hyperlipidemia, unspecified: Secondary | ICD-10-CM | POA: Diagnosis not present

## 2023-05-02 DIAGNOSIS — I129 Hypertensive chronic kidney disease with stage 1 through stage 4 chronic kidney disease, or unspecified chronic kidney disease: Secondary | ICD-10-CM | POA: Diagnosis not present

## 2023-05-02 DIAGNOSIS — Z955 Presence of coronary angioplasty implant and graft: Secondary | ICD-10-CM | POA: Diagnosis not present

## 2023-05-04 DIAGNOSIS — Z955 Presence of coronary angioplasty implant and graft: Secondary | ICD-10-CM | POA: Diagnosis not present

## 2023-05-04 DIAGNOSIS — I129 Hypertensive chronic kidney disease with stage 1 through stage 4 chronic kidney disease, or unspecified chronic kidney disease: Secondary | ICD-10-CM | POA: Diagnosis not present

## 2023-05-04 DIAGNOSIS — I2510 Atherosclerotic heart disease of native coronary artery without angina pectoris: Secondary | ICD-10-CM | POA: Diagnosis not present

## 2023-05-04 DIAGNOSIS — G2581 Restless legs syndrome: Secondary | ICD-10-CM | POA: Diagnosis not present

## 2023-05-04 DIAGNOSIS — E785 Hyperlipidemia, unspecified: Secondary | ICD-10-CM | POA: Diagnosis not present

## 2023-05-07 ENCOUNTER — Ambulatory Visit (INDEPENDENT_AMBULATORY_CARE_PROVIDER_SITE_OTHER): Payer: Medicare HMO | Admitting: Internal Medicine

## 2023-05-07 ENCOUNTER — Encounter: Payer: Self-pay | Admitting: Internal Medicine

## 2023-05-07 VITALS — BP 124/70 | HR 84 | Ht 62.0 in | Wt 142.6 lb

## 2023-05-07 DIAGNOSIS — E1159 Type 2 diabetes mellitus with other circulatory complications: Secondary | ICD-10-CM | POA: Diagnosis not present

## 2023-05-07 DIAGNOSIS — Z7984 Long term (current) use of oral hypoglycemic drugs: Secondary | ICD-10-CM | POA: Diagnosis not present

## 2023-05-07 DIAGNOSIS — E785 Hyperlipidemia, unspecified: Secondary | ICD-10-CM

## 2023-05-07 DIAGNOSIS — I129 Hypertensive chronic kidney disease with stage 1 through stage 4 chronic kidney disease, or unspecified chronic kidney disease: Secondary | ICD-10-CM | POA: Diagnosis not present

## 2023-05-07 DIAGNOSIS — Z7985 Long-term (current) use of injectable non-insulin antidiabetic drugs: Secondary | ICD-10-CM

## 2023-05-07 DIAGNOSIS — Z955 Presence of coronary angioplasty implant and graft: Secondary | ICD-10-CM | POA: Diagnosis not present

## 2023-05-07 DIAGNOSIS — E1169 Type 2 diabetes mellitus with other specified complication: Secondary | ICD-10-CM | POA: Diagnosis not present

## 2023-05-07 DIAGNOSIS — E1142 Type 2 diabetes mellitus with diabetic polyneuropathy: Secondary | ICD-10-CM

## 2023-05-07 DIAGNOSIS — G2581 Restless legs syndrome: Secondary | ICD-10-CM | POA: Diagnosis not present

## 2023-05-07 DIAGNOSIS — I2510 Atherosclerotic heart disease of native coronary artery without angina pectoris: Secondary | ICD-10-CM | POA: Diagnosis not present
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
## 2023-05-07 NOTE — Patient Instructions (Addendum)
Please continue: - Jardiance 25 mg before b'fast - Prandin 1 mg before a larger dinner - Trulicity 3 mg weekly  Please return in 6 months with your sugar log.

## 2023-05-08 ENCOUNTER — Encounter: Payer: Self-pay | Admitting: Internal Medicine

## 2023-05-08 ENCOUNTER — Encounter: Payer: Self-pay | Admitting: Family Medicine

## 2023-05-08 ENCOUNTER — Other Ambulatory Visit (INDEPENDENT_AMBULATORY_CARE_PROVIDER_SITE_OTHER): Payer: TRICARE For Life (TFL)

## 2023-05-08 DIAGNOSIS — E039 Hypothyroidism, unspecified: Secondary | ICD-10-CM

## 2023-05-08 DIAGNOSIS — E1165 Type 2 diabetes mellitus with hyperglycemia: Secondary | ICD-10-CM | POA: Diagnosis not present

## 2023-05-08 DIAGNOSIS — E1140 Type 2 diabetes mellitus with diabetic neuropathy, unspecified: Secondary | ICD-10-CM | POA: Diagnosis not present

## 2023-05-08 LAB — HEMOGLOBIN A1C: Hgb A1c MFr Bld: 5.8 % (ref 4.6–6.5)

## 2023-05-08 LAB — MICROALBUMIN / CREATININE URINE RATIO
Creatinine, Urine: 79 mg/dL (ref 20–275)
Microalb Creat Ratio: 5 mg/g{creat} (ref <30)
Microalb, Ur: 0.4 mg/dL

## 2023-05-08 LAB — TSH: TSH: 1.36 u[IU]/mL (ref 0.35–5.50)

## 2023-05-09 ENCOUNTER — Other Ambulatory Visit: Payer: Self-pay

## 2023-05-09 MED ORDER — LEVOTHYROXINE SODIUM 50 MCG PO TABS: 50.0000 ug | ORAL_TABLET | Freq: Every day | ORAL | 3 refills | Status: AC

## 2023-05-11 DIAGNOSIS — I129 Hypertensive chronic kidney disease with stage 1 through stage 4 chronic kidney disease, or unspecified chronic kidney disease: Secondary | ICD-10-CM | POA: Diagnosis not present

## 2023-05-11 DIAGNOSIS — E785 Hyperlipidemia, unspecified: Secondary | ICD-10-CM | POA: Diagnosis not present

## 2023-05-11 DIAGNOSIS — Z955 Presence of coronary angioplasty implant and graft: Secondary | ICD-10-CM | POA: Diagnosis not present

## 2023-05-11 DIAGNOSIS — G2581 Restless legs syndrome: Secondary | ICD-10-CM | POA: Diagnosis not present

## 2023-05-11 DIAGNOSIS — I2510 Atherosclerotic heart disease of native coronary artery without angina pectoris: Secondary | ICD-10-CM | POA: Diagnosis not present

## 2023-05-12 ENCOUNTER — Other Ambulatory Visit: Payer: Self-pay | Admitting: Family Medicine

## 2023-05-14 DIAGNOSIS — G2581 Restless legs syndrome: Secondary | ICD-10-CM | POA: Diagnosis not present

## 2023-05-14 DIAGNOSIS — I2510 Atherosclerotic heart disease of native coronary artery without angina pectoris: Secondary | ICD-10-CM | POA: Diagnosis not present

## 2023-05-14 DIAGNOSIS — I129 Hypertensive chronic kidney disease with stage 1 through stage 4 chronic kidney disease, or unspecified chronic kidney disease: Secondary | ICD-10-CM | POA: Diagnosis not present

## 2023-05-14 DIAGNOSIS — E785 Hyperlipidemia, unspecified: Secondary | ICD-10-CM | POA: Diagnosis not present

## 2023-05-14 DIAGNOSIS — Z955 Presence of coronary angioplasty implant and graft: Secondary | ICD-10-CM | POA: Diagnosis not present

## 2023-05-14 MED ORDER — CARVEDILOL 3.125 MG PO TABS: 3.1250 mg | ORAL_TABLET | Freq: Two times a day (BID) | ORAL | 3 refills | Status: DC

## 2023-05-14 MED ORDER — ISOSORBIDE MONONITRATE ER 30 MG PO TB24: 30.0000 mg | ORAL_TABLET | Freq: Every day | ORAL | 3 refills | Status: AC

## 2023-05-14 MED ORDER — ROSUVASTATIN CALCIUM 20 MG PO TABS: 20.0000 mg | ORAL_TABLET | Freq: Every day | ORAL | 3 refills | Status: AC

## 2023-05-14 MED ORDER — CLOPIDOGREL BISULFATE 75 MG PO TABS: 75.0000 mg | ORAL_TABLET | Freq: Every day | ORAL | 3 refills | Status: DC

## 2023-05-16 DIAGNOSIS — G2581 Restless legs syndrome: Secondary | ICD-10-CM | POA: Diagnosis not present

## 2023-05-16 DIAGNOSIS — I2510 Atherosclerotic heart disease of native coronary artery without angina pectoris: Secondary | ICD-10-CM | POA: Diagnosis not present

## 2023-05-16 DIAGNOSIS — I129 Hypertensive chronic kidney disease with stage 1 through stage 4 chronic kidney disease, or unspecified chronic kidney disease: Secondary | ICD-10-CM | POA: Diagnosis not present

## 2023-05-16 DIAGNOSIS — Z955 Presence of coronary angioplasty implant and graft: Secondary | ICD-10-CM | POA: Diagnosis not present

## 2023-05-16 DIAGNOSIS — E785 Hyperlipidemia, unspecified: Secondary | ICD-10-CM | POA: Diagnosis not present

## 2023-05-18 DIAGNOSIS — G2581 Restless legs syndrome: Secondary | ICD-10-CM | POA: Diagnosis not present

## 2023-05-18 DIAGNOSIS — E785 Hyperlipidemia, unspecified: Secondary | ICD-10-CM | POA: Diagnosis not present

## 2023-05-18 DIAGNOSIS — I2510 Atherosclerotic heart disease of native coronary artery without angina pectoris: Secondary | ICD-10-CM | POA: Diagnosis not present

## 2023-05-18 DIAGNOSIS — I129 Hypertensive chronic kidney disease with stage 1 through stage 4 chronic kidney disease, or unspecified chronic kidney disease: Secondary | ICD-10-CM | POA: Diagnosis not present

## 2023-05-18 DIAGNOSIS — Z955 Presence of coronary angioplasty implant and graft: Secondary | ICD-10-CM | POA: Diagnosis not present

## 2023-05-24 DIAGNOSIS — H43392 Other vitreous opacities, left eye: Secondary | ICD-10-CM | POA: Diagnosis not present

## 2023-05-24 DIAGNOSIS — H35032 Hypertensive retinopathy, left eye: Secondary | ICD-10-CM | POA: Diagnosis not present

## 2023-05-24 DIAGNOSIS — H02836 Dermatochalasis of left eye, unspecified eyelid: Secondary | ICD-10-CM | POA: Diagnosis not present

## 2023-05-24 DIAGNOSIS — H34832 Tributary (branch) retinal vein occlusion, left eye, with macular edema: Secondary | ICD-10-CM | POA: Diagnosis not present

## 2023-05-24 DIAGNOSIS — H02833 Dermatochalasis of right eye, unspecified eyelid: Secondary | ICD-10-CM | POA: Diagnosis not present

## 2023-05-24 DIAGNOSIS — H35352 Cystoid macular degeneration, left eye: Secondary | ICD-10-CM | POA: Diagnosis not present

## 2023-05-24 DIAGNOSIS — H43811 Vitreous degeneration, right eye: Secondary | ICD-10-CM | POA: Diagnosis not present

## 2023-06-01 DIAGNOSIS — Z955 Presence of coronary angioplasty implant and graft: Secondary | ICD-10-CM | POA: Diagnosis not present

## 2023-06-04 DIAGNOSIS — Z955 Presence of coronary angioplasty implant and graft: Secondary | ICD-10-CM | POA: Diagnosis not present

## 2023-06-06 DIAGNOSIS — Z955 Presence of coronary angioplasty implant and graft: Secondary | ICD-10-CM | POA: Diagnosis not present

## 2023-06-11 DIAGNOSIS — Z955 Presence of coronary angioplasty implant and graft: Secondary | ICD-10-CM | POA: Diagnosis not present

## 2023-06-12 ENCOUNTER — Other Ambulatory Visit: Payer: Self-pay | Admitting: Family Medicine

## 2023-06-13 DIAGNOSIS — Z955 Presence of coronary angioplasty implant and graft: Secondary | ICD-10-CM | POA: Diagnosis not present

## 2023-06-15 DIAGNOSIS — Z955 Presence of coronary angioplasty implant and graft: Secondary | ICD-10-CM | POA: Diagnosis not present

## 2023-06-18 DIAGNOSIS — Z955 Presence of coronary angioplasty implant and graft: Secondary | ICD-10-CM | POA: Diagnosis not present

## 2023-06-28 ENCOUNTER — Other Ambulatory Visit: Payer: Self-pay | Admitting: Family Medicine

## 2023-06-29 ENCOUNTER — Other Ambulatory Visit (HOSPITAL_COMMUNITY): Payer: Self-pay

## 2023-06-29 MED ORDER — FLUOXETINE HCL 40 MG PO CAPS: 40.0000 mg | ORAL_CAPSULE | Freq: Every day | ORAL | 1 refills | Status: DC

## 2023-07-11 ENCOUNTER — Ambulatory Visit

## 2023-07-11 VITALS — BP 106/62 | HR 80 | Ht 62.0 in | Wt 142.2 lb

## 2023-07-11 DIAGNOSIS — I2510 Atherosclerotic heart disease of native coronary artery without angina pectoris: Secondary | ICD-10-CM

## 2023-07-11 DIAGNOSIS — I10 Essential (primary) hypertension: Secondary | ICD-10-CM | POA: Diagnosis not present

## 2023-07-11 DIAGNOSIS — E782 Mixed hyperlipidemia: Secondary | ICD-10-CM

## 2023-07-11 MED ORDER — METOPROLOL SUCCINATE ER 25 MG PO TB24: 25.0000 mg | ORAL_TABLET | Freq: Every day | ORAL | 3 refills | Status: DC

## 2023-07-11 NOTE — Assessment & Plan Note (Signed)
 Last cardiac cath 03-27-2023 with CFR 1.8 and mildly elevated LVEDP 25 mmHg. Cardiac function however shows normal biventricular function. She does not show any clinical signs of heart failure.  Recommended to keep herself well-hydrated. Continue dual antiplatelet therapy for at least 12 months with aspirin  and Plavix . Continue aspirin  81 mg once daily for life.  Continue antianginal regimen with amlodipine  2.5 mg once daily Imdur  30 mg once daily Discontinue carvedilol  and will switch to metoprolol  XL 25 mg once daily to avoid hypotension. If blood pressures further remain low will discontinue losartan .  Continue with cardiac rehab.

## 2023-07-11 NOTE — Progress Notes (Signed)
 Cardiology Consultation:    Date:  07/11/2023   ID:  [name], DOB [birth year], MRN [id number]  PCP:  Clemens Curt, MD  Cardiologist:  Daymon Evans Ronica Vivian, MD   Referring MD: Clemens Curt, MD   No chief complaint on file.    ASSESSMENT AND PLAN:   [name] 78 year old woman history of coronary artery disease with progressive symptoms of dyspnea evaluated with cardiac CT November 2024 was abnormal, subsequently cardiac catheterization and PCI of mid LAD 03/08/2023, subsequently with chest pain was readmitted and redo cath on 03-27-2023 showed patent LAD stent and nonsignificant by RFR ostial RCA lesion and CFR of 1.8 suggested microvascular dysfunction with elevated LVEDP 25 mmHg. Also has history of normal biventricular function on echocardiogram January 2025, mild mitral stenosis, mild aortic insufficiency, hypertension, hyperlipidemia, hypothyroidism, irritable bowel syndrome, hiatal hernia s/p unsuccessful repair, seizure disorder, chronic thrombocytopenia, gastroparesis, NASH, chronic dizziness, chronic microvascular ischemic changes on CT head September 2024, hemorrhoids, palpitations without significant abnormalities on event monitor October 2024. Here for routine follow-up visit Problem List Items Addressed This Visit     Essential hypertension   Well-controlled. In fact on the lower end and likely contributing to her symptoms of no energy with exertion. Currently on amlodipine  2.5 mg Carvedilol  3.125 mg twice daily Imdur  30 mg once daily Losartan  25 mg once daily.  Will switch her from carvedilol  to metoprolol  XL 25 mg once daily. If blood pressures remain persistently low, would recommend discontinuing losartan .        Relevant Medications   metoprolol  succinate (TOPROL -XL) 25 MG 24 hr tablet   Hyperlipidemia   Continue with current treatment with rosuvastatin  20 mg once daily Zetia  10 mg once daily Last lipid panel is from February 2024 with LDL 55, total  cholesterol 112, HDL 33.       Relevant Medications   metoprolol  succinate (TOPROL -XL) 25 MG 24 hr tablet   CAD (coronary artery disease)  CT Cardiac 01/19/2023 calcium  score 411, plaque volume 366, moderate stenosis LAD, mild stenosis D1, RCA, LCx/OM 3; S/p PCI of Mid LAD 03/08/2023;rpt cath 03/27/2023 stable - Primary   Last cardiac cath 03-27-2023 with CFR 1.8 and mildly elevated LVEDP 25 mmHg. Cardiac function however shows normal biventricular function. She does not show any clinical signs of heart failure.  Recommended to keep herself well-hydrated. Continue dual antiplatelet therapy for at least 12 months with aspirin  and Plavix . Continue aspirin  81 mg once daily for life.  Continue antianginal regimen with amlodipine  2.5 mg once daily Imdur  30 mg once daily Discontinue carvedilol  and will switch to metoprolol  XL 25 mg once daily to avoid hypotension. If blood pressures further remain low will discontinue losartan .  Continue with cardiac rehab.       Relevant Medications   metoprolol  succinate (TOPROL -XL) 25 MG 24 hr tablet   Return to clinic tentatively in 6 months.   History of Present Illness:    Valerie Ball is a 78 y.o. female who is being seen today for follow-up visit. PCP is Tower, Manley Seeds, MD. Last office visit with me was 04-09-2023.  Has history of coronary artery disease with progressive symptoms of dyspnea evaluated with cardiac CT November 2024 was abnormal, subsequently cardiac catheterization and PCI of mid LAD 03/08/2023, subsequently with chest pain was readmitted and redo cath on 03-27-2023 showed patent LAD stent and nonsignificant by RFR ostial RCA lesion and CFR of 1.8 suggested microvascular dysfunction with elevated LVEDP 25 mmHg. Also has history  of normal biventricular function on echocardiogram January 2025, mild mitral stenosis, mild aortic insufficiency, hypertension, hyperlipidemia, hypothyroidism, irritable bowel syndrome, hiatal hernia s/p  unsuccessful repair, seizure disorder, chronic thrombocytopenia, gastroparesis, NASH, chronic dizziness, chronic microvascular ischemic changes on CT head September 2024, hemorrhoids, palpitations without significant abnormalities on event monitor October 2024.  Here for the visit by herself.  Lives with her husband at home. Has been participating in cardiac rehab and feels better.  She does however note feeling tired after cardiac rehab and feels lack of energy.  Denies any chest pain or shortness of breath.  Denies any orthopnea or paroxysmal nocturnal dyspnea.  No pedal edema.  At times notices blood in stools related to her hemorrhoids but no significant bleeding concerns.  Good compliance with her medications.  Blood work from 05-08-2023 with hemoglobin A1c 5.8 TSH normal 1.36   Past Medical History:  Diagnosis Date   Allergic rhinitis 08/22/2006   Qualifier: Diagnosis of   By: Lennice Quivers CMA, [name] Patient      IMO SNOMED Dx Update Oct 2024     Allergy    allegic rhinitis   Alternating constipation and diarrhea 08/12/2015   Anemia    Angiodysplasia of colon    Anxiety    Arthritis    AVM (arteriovenous malformation) of colon    Bilateral knee pain 05/11/2022   Blurred vision, right eye 08/11/2021   Patient does have blurred vision right eye,     Borderline diabetic    per patient medical history form   Branch retinal vein occlusion with macular edema of left eye 07/14/2019   CME superior to the fovea OS has worsened at 7-week interval from macular branch retinal vein occlusion, repeat Avastin  today and evaluate exam in 6 weeks     Breast cancer (HCC) 02/2009   breast CA L invasive ductal CA(hormone receptor positive.)   CAD (coronary artery disease)  CT Cardiac 01/19/2023 calcium  score 411, plaque volume 366, moderate stenosis LAD, mild stenosis D1, RCA, LCx/OM 3 02/01/2023   Chest pain 12/29/2022   Chronic idiopathic thrombocytopenia (HCC) 03/23/2023   Colon cancer screening  01/23/2014   Colon polyp    hyperplastic   Colon stricture (HCC) 01/14/2003   Cystoid macular edema of left eye 07/14/2019   Decreased calculated GFR 05/11/2022   Diabetic gastroparesis (HCC) 03/27/2016   Typical symptoms -suspect      No detectable diabetic retinopathy 12-27-2020     Diabetic neuropathy (HCC) 11/25/2013   Diaphragmatic hernia 03/02/2008   Qualifier: Diagnosis of   By: Celestia Colander CMA Nonie Beady), Leisha      IMO SNOMED Dx Update Oct 2024     Diverticulitis of colon 10/14/2014   Diverticulosis    Dizziness 01/07/2014   Dyspnea on exertion 01/04/2023   Encounter for Medicare annual wellness exam 01/23/2014   ESOPHAGEAL STRICTURE 03/02/2008   Qualifier: Diagnosis of   By: Celestia Colander CMA (AAMA), Christine Cozier         Essential hypertension 08/22/2006   Qualifier: Diagnosis of   By: Lennice Quivers CMA, [name] Patient         Estrogen deficiency 01/23/2014   Eye pain 03/23/2023   Fatigue 05/20/2018   Fatty liver    Fever, low grade 09/24/2020   GAD (generalized anxiety disorder) 04/15/2007   Qualifier: Diagnosis of   By: Mildred All FNP, Gwyn Leos        GI bleed    Gout    Hepatic steatosis    Hiatal hernia    Hip pain,  bilateral 08/31/2017   History of breast cancer 08/02/2009   Qualifier: Diagnosis of   By: Malissa Se MD, Dannette Dutch        History of CAD (coronary artery disease) 03/23/2023   History of COVID-19 09/24/2020   History of removal of implants of both breasts 07/01/2013   06/25/13 elected to have bilateral breast implants removed.     History of seizure 08/22/2006   Qualifier: Diagnosis of   By: Lennice Quivers CMA, [name] Patient         Hyperlipidemia    Hyperlipidemia associated with type 2 diabetes mellitus (HCC) 08/22/2006   Qualifier: Diagnosis of   By: Lennice Quivers CMA, [name] Patient         Hypertension    Hypertensive retinopathy of left eye 07/14/2019   Hypothyroidism    IBS (irritable bowel syndrome)    Lap Nissen October 2013 01/19/2012   Laparoscopic takedown of large type III  mixed hiatus hernia with primary closure of the diaphragm with 3 sutures posteriorally (post 2 with pledgets) and Nissen fundoplication over a #56 lighted bougie        Large type III mixed hiatus hernia  12/06/2011   Plan surgery to repair diaphragm and do Nissen fundoplication     Macular edema    left eye   Melena 01/2008   anemia transfusion   Menopause    per patient medical history form   Mitral stenosis and aortic incompetence, by TTE 01/25/2023 02/01/2023   Non-insulin  treated type 2 diabetes mellitus (HCC) 07/25/2010   Ask patient to clarify. Type not noted on medical history form.     Palpitations 01/04/2023   Peritoneal adhesions 03/02/2008   Qualifier: Diagnosis of   By: Celestia Colander CMA Nonie Beady), Christine Cozier      IMO SNOMED Dx Update Oct 2024     Poor balance 01/30/2023   Poorly controlled type 2 diabetes mellitus with neuropathy (HCC) 08/19/2007   Qualifier: Diagnosis of   By: Malissa Se MD, Dannette Dutch        Posterior vitreous detachment of right eye 11/06/2019   Postmenopausal hormone therapy    per patient medical history form.   Retinal vein occlusion, branch 01/03/2016   Left with hemorrhage and edema (Dr Seward Dao) 2017     Routine general medical examination at a health care facility 07/11/2011   Salmonella 05/2000   renal effects secondary to dehydration   Seizure disorder (HCC)    Seizures (HCC)    one seizure several yrs ago --etiology unknown-no problem since   Stress reaction 10/25/2011   SYNDROME, CARPAL TUNNEL 08/23/2006   Qualifier: Diagnosis of   By: Malissa Se MD, Dannette Dutch        SYNDROME, RESTLESS LEGS 08/23/2006   Qualifier: Diagnosis of   By: Malissa Se MD, Dannette Dutch        Thrombocytopenia (HCC) 11/25/2013   TRANSAMINASES, SERUM, ELEVATED 11/11/2009   Qualifier: Diagnosis of   By: Malissa Se MD, Dannette Dutch        Tremor 01/20/2014   Head and right hand      Vitamin B12 deficiency 09/21/2016   Vitreous floaters of left eye 07/14/2019   The nature of vitreous floaters was  discussed with the patient as well as their frequent occurrence with development of posterior vitreous detachment. The significance of flashes and field cuts was discussed with the patient. The need for a dilated fundus examination was discussed and advice given to return immediately for new or different floaters .  More uncommonly, vitreous floaters,  debris, or    Past Surgical History:  Procedure Laterality Date   ABDOMINAL HYSTERECTOMY  1983   BREAST SURGERY  02/2009   breast biopsy invasive ductal CA-bilateral mastectomies   CORONARY PRESSURE/FFR STUDY N/A 03/27/2023   Procedure: CORONARY PRESSURE/FFR STUDY;  Surgeon: Kyra Phy, MD;  Location: [facility];  Service: Cardiovascular;  Laterality: N/A;   CORONARY STENT INTERVENTION N/A 03/08/2023   Procedure: CORONARY STENT INTERVENTION;  Surgeon: Odie Benne, MD;  Location: [facility];  Service: Cardiovascular;  Laterality: N/A;   EPIGASTRIC HERNIA REPAIR  01/02/2012   Procedure: HERNIA REPAIR EPIGASTRIC ADULT;  Surgeon: Azucena Bollard, MD;  Location: [facility];  Service: General;  Laterality: N/A;  Laparoscopic Repair Paraesophageal Hernia, Nissen   EYE SURGERY Left 07/2017   Dr. Reatha Campanile CYST EXCISION     LAPAROSCOPIC NISSEN FUNDOPLICATION  01/02/2012   Procedure: LAPAROSCOPIC NISSEN FUNDOPLICATION;  Surgeon: Azucena Bollard, MD;  Location: [facility];  Service: General;  Laterality: N/A;   LEFT HEART CATH AND CORONARY ANGIOGRAPHY N/A 03/08/2023   Procedure: LEFT HEART CATH AND CORONARY ANGIOGRAPHY;  Surgeon: Odie Benne, MD;  Location: [facility];  Service: Cardiovascular;  Laterality: N/A;   LEFT HEART CATH AND CORONARY ANGIOGRAPHY N/A 03/27/2023   Procedure: LEFT HEART CATH AND CORONARY ANGIOGRAPHY;  Surgeon: Kyra Phy, MD;  Location: [facility];  Service: Cardiovascular;  Laterality: N/A;   MASTECTOMY  04/2009   Bilateral for ductal carcinoma on L   OVARY SURGERY   1986   ovarian tumor removed   TUBAL LIGATION      Current Medications: Current Meds  Medication Sig   amLODipine  (NORVASC ) 2.5 MG tablet Take 1 tablet (2.5 mg total) by mouth daily.   aspirin  EC 81 MG tablet Take 1 tablet (81 mg total) by mouth daily. Swallow whole.   Calcium  Carbonate (CALCIUM  500 PO) Take 500 mg by mouth daily.   Cholecalciferol (VITAMIN D3) 1000 units CAPS Take 1,000 Units by mouth daily.   clopidogrel  (PLAVIX ) 75 MG tablet Take 1 tablet (75 mg total) by mouth daily.   Cyanocobalamin  (B-12) 1000 MCG TABS Take 1,000 mcg by mouth daily.   Dulaglutide  (TRULICITY ) 3 MG/0.5ML SOPN INJECT 0.5 ML (3 MG) UNDER THE SKIN ONCE A WEEK   empagliflozin  (JARDIANCE ) 25 MG TABS tablet Take 1 tablet (25 mg total) by mouth daily before breakfast.   ezetimibe  (ZETIA ) 10 MG tablet TAKE 1 TABLET DAILY   famotidine  (PEPCID ) 20 MG tablet TAKE 1 TABLET TWICE A DAY   FLUoxetine  (PROZAC ) 40 MG capsule Take 1 capsule (40 mg total) by mouth daily.   fluticasone  (FLONASE ) 50 MCG/ACT nasal spray USE 2 SPRAYS IN EACH NOSTRIL DAILY AS NEEDED FOR ALLERGIES   isosorbide  mononitrate (IMDUR ) 30 MG 24 hr tablet Take 1 tablet (30 mg total) by mouth daily.   levothyroxine  (SYNTHROID ) 50 MCG tablet Take 1 tablet (50 mcg total) by mouth daily before breakfast.   losartan  (COZAAR ) 25 MG tablet Take 1 tablet (25 mg total) by mouth daily.   metoprolol  succinate (TOPROL -XL) 25 MG 24 hr tablet Take 1 tablet (25 mg total) by mouth daily. Take with or immediately following a meal.   Multiple Vitamin (MULTIVITAMIN WITH MINERALS) TABS tablet Take 1 tablet by mouth daily.   nitroGLYCERIN  (NITROSTAT ) 0.4 MG SL tablet Place 1 tablet (0.4 mg total) under the tongue every 5 (five) minutes as needed for chest pain.   pantoprazole  (  PROTONIX ) 20 MG tablet Take 1 tablet (20 mg total) by mouth daily.   repaglinide  (PRANDIN ) 1 MG tablet TAKE 1 TABLET DAILY BEFORE SUPPER   rOPINIRole  (REQUIP ) 1 MG tablet TAKE 1 TABLET AT BEDTIME    rosuvastatin  (CRESTOR ) 20 MG tablet Take 1 tablet (20 mg total) by mouth daily.   [DISCONTINUED] carvedilol  (COREG ) 3.125 MG tablet Take 1 tablet (3.125 mg total) by mouth 2 (two) times daily with a meal.     Allergies:   Arimidex [anastrozole], Glipizide , Metformin  and related, and Penicillins   Social History   Socioeconomic History   Marital status: Married    Spouse name: Not on file   Number of children: 3   Years of education: Not on file   Highest education level: Not on file  Occupational History   Occupation: retired    Associate Professor: UNEMPLOYED  Tobacco Use   Smoking status: Never   Smokeless tobacco: Never  Vaping Use   Vaping status: Never Used  Substance and Sexual Activity   Alcohol use: Yes    Comment: occasional   Drug use: Never   Sexual activity: Not Currently  Other Topics Concern   Not on file  Social History Narrative   ** Merged History Encounter **       1 cup of coffee every morning   Social Drivers of Health   Financial Resource Strain: Low Risk  (02/28/2023)   Overall Financial Resource Strain (CARDIA)    Difficulty of Paying Living Expenses: Not hard at all  Food Insecurity: No Food Insecurity (03/24/2023)   Hunger Vital Sign    Worried About Running Out of Food in the Last Year: Never true    Ran Out of Food in the Last Year: Never true  Transportation Needs: No Transportation Needs (03/24/2023)   PRAPARE - Administrator, Civil Service (Medical): No    Lack of Transportation (Non-Medical): No  Physical Activity: Inactive (02/28/2023)   Exercise Vital Sign    Days of Exercise per Week: 0 days    Minutes of Exercise per Session: 0 min  Stress: Stress Concern Present (02/28/2023)   [employer] of Occupational Health - Occupational Stress Questionnaire    Feeling of Stress : Rather much  Social Connections: Socially Integrated (03/24/2023)   Social Connection and Isolation Panel [NHANES]    Frequency of Communication with  Friends and Family: Twice a week    Frequency of Social Gatherings with Friends and Family: Once a week    Attends Religious Services: More than 4 times per year    Active Member of [employer] or Organizations: Yes    Attends Engineer, structural: 1 to 4 times per year    Marital Status: Married     Family History: The patient's family history includes Breast cancer in her mother, sister, and sister; Diabetes in her brother, father, and sister; Esophageal cancer in her father; Heart attack in her father; Hypertension in her brother, mother, and sister; Stroke in her mother. ROS:   Please see the history of present illness.    All 14 point review of systems negative except as described per history of present illness.  EKGs/Labs/Other Studies Reviewed:    The following studies were reviewed today:   EKG:       Recent Labs: 03/24/2023: ALT 23; B Natriuretic Peptide 43.0; Hemoglobin 12.2; Platelets 128 03/27/2023: BUN 22; Creatinine, Ser 1.29; Potassium 3.8; Sodium 139 05/08/2023: TSH 1.36  Recent Lipid Panel  Component Value Date/Time   CHOL 112 05/04/2022 0909   TRIG 209.0 (H) 05/04/2022 0909   HDL 33.10 (L) 05/04/2022 0909   CHOLHDL 3 05/04/2022 0909   VLDL 41.8 (H) 05/04/2022 0909   LDLCALC 52 05/04/2021 0901   LDLDIRECT 55.0 05/04/2022 0909    Physical Exam:    VS:  BP 106/62   Pulse 80   Ht 5\' 2"  (1.575 m)   Wt 142 lb 3.2 oz (64.5 kg)   LMP 03/20/1981   SpO2 96%   BMI 26.01 kg/m     Wt Readings from Last 3 Encounters:  07/11/23 142 lb 3.2 oz (64.5 kg)  05/07/23 142 lb 9.6 oz (64.7 kg)  04/09/23 145 lb 6.4 oz (66 kg)     GENERAL:  Well nourished, well developed in no acute distress NECK: No JVD; No carotid bruits CARDIAC: RRR, S1 and S2 present, no murmurs, no rubs, no gallops CHEST:  Clear to auscultation without rales, wheezing or rhonchi  Extremities: No pitting pedal edema. Pulses bilaterally symmetric with radial 2+ and dorsalis pedis 2+ NEUROLOGIC:   Alert and oriented x 3  Medication Adjustments/Labs and Tests Ordered: Current medicines are reviewed at length with the patient today.  Concerns regarding medicines are outlined above.  No orders of the defined types were placed in this encounter.  Meds ordered this encounter  Medications   metoprolol  succinate (TOPROL -XL) 25 MG 24 hr tablet    Sig: Take 1 tablet (25 mg total) by mouth daily. Take with or immediately following a meal.    Dispense:  90 tablet    Refill:  3    Signed, Jaevian Shean reddy Mikia Delaluz, MD, MPH, [facility]. 07/11/2023 1:51 PM    [facility]

## 2023-07-11 NOTE — Patient Instructions (Signed)
 Medication Instructions:   Stop Carvedilol .  Start Metoprolol  Xl 25 mg once a day *If you need a refill on your cardiac medications before your next appointment, please call your pharmacy*   Lab Work: None Ordered If you have labs (blood work) drawn today and your tests are completely normal, you will receive your results only by: MyChart Message (if you have MyChart) OR A paper copy in the mail If you have any lab test that is abnormal or we need to change your treatment, we will call you to review the results.   Testing/Procedures: None Ordered   Follow-Up: At [facility], you and your health needs are our priority.  As part of our continuing mission to provide you with exceptional heart care, we have created designated Provider Care Teams.  These Care Teams include your primary Cardiologist (physician) and Advanced Practice Providers (APPs -  Physician Assistants and Nurse Practitioners) who all work together to provide you with the care you need, when you need it.  We recommend signing up for the patient portal called "MyChart".  Sign up information is provided on this After Visit Summary.  MyChart is used to connect with patients for Virtual Visits (Telemedicine).  Patients are able to view lab/test results, encounter notes, upcoming appointments, etc.  Non-urgent messages can be sent to your provider as well.   To learn more about what you can do with MyChart, go to [email or web address].    Your next appointment:   6 month follow up

## 2023-07-11 NOTE — Assessment & Plan Note (Signed)
 Continue with current treatment with rosuvastatin  20 mg once daily Zetia  10 mg once daily Last lipid panel is from February 2024 with LDL 55, total cholesterol 621, HDL 33.

## 2023-07-11 NOTE — Assessment & Plan Note (Signed)
 Well-controlled. In fact on the lower end and likely contributing to her symptoms of no energy with exertion. Currently on amlodipine  2.5 mg Carvedilol  3.125 mg twice daily Imdur  30 mg once daily Losartan  25 mg once daily.  Will switch her from carvedilol  to metoprolol  XL 25 mg once daily. If blood pressures remain persistently low, would recommend discontinuing losartan .

## 2023-07-12 DIAGNOSIS — H35352 Cystoid macular degeneration, left eye: Secondary | ICD-10-CM | POA: Diagnosis not present

## 2023-07-12 DIAGNOSIS — H43811 Vitreous degeneration, right eye: Secondary | ICD-10-CM | POA: Diagnosis not present

## 2023-07-12 DIAGNOSIS — H34832 Tributary (branch) retinal vein occlusion, left eye, with macular edema: Secondary | ICD-10-CM | POA: Diagnosis not present

## 2023-07-12 DIAGNOSIS — H43392 Other vitreous opacities, left eye: Secondary | ICD-10-CM | POA: Diagnosis not present

## 2023-07-12 DIAGNOSIS — H35032 Hypertensive retinopathy, left eye: Secondary | ICD-10-CM | POA: Diagnosis not present

## 2023-07-12 DIAGNOSIS — H02836 Dermatochalasis of left eye, unspecified eyelid: Secondary | ICD-10-CM | POA: Diagnosis not present

## 2023-07-12 DIAGNOSIS — H02833 Dermatochalasis of right eye, unspecified eyelid: Secondary | ICD-10-CM | POA: Diagnosis not present

## 2023-07-20 DIAGNOSIS — Z955 Presence of coronary angioplasty implant and graft: Secondary | ICD-10-CM | POA: Diagnosis not present

## 2023-07-25 ENCOUNTER — Ambulatory Visit (INDEPENDENT_AMBULATORY_CARE_PROVIDER_SITE_OTHER): Admitting: Family Medicine

## 2023-07-25 ENCOUNTER — Ambulatory Visit: Payer: Self-pay

## 2023-07-25 ENCOUNTER — Encounter: Payer: Self-pay | Admitting: Family Medicine

## 2023-07-25 VITALS — BP 116/58 | HR 74 | Temp 98.0°F | Ht 62.0 in | Wt 144.2 lb

## 2023-07-25 DIAGNOSIS — I10 Essential (primary) hypertension: Secondary | ICD-10-CM | POA: Diagnosis not present

## 2023-07-25 DIAGNOSIS — F411 Generalized anxiety disorder: Secondary | ICD-10-CM

## 2023-07-25 DIAGNOSIS — R5382 Chronic fatigue, unspecified: Secondary | ICD-10-CM | POA: Diagnosis not present

## 2023-07-25 DIAGNOSIS — M79604 Pain in right leg: Secondary | ICD-10-CM

## 2023-07-25 DIAGNOSIS — M170 Bilateral primary osteoarthritis of knee: Secondary | ICD-10-CM

## 2023-07-25 DIAGNOSIS — M79605 Pain in left leg: Secondary | ICD-10-CM

## 2023-07-25 DIAGNOSIS — E1142 Type 2 diabetes mellitus with diabetic polyneuropathy: Secondary | ICD-10-CM | POA: Diagnosis not present

## 2023-07-25 DIAGNOSIS — F430 Acute stress reaction: Secondary | ICD-10-CM

## 2023-07-25 HISTORY — DX: Pain in right leg: M79.604

## 2023-07-25 MED ORDER — GABAPENTIN 100 MG PO CAPS: 100.0000 mg | ORAL_CAPSULE | Freq: Every day | ORAL | 1 refills | Status: DC

## 2023-07-25 NOTE — Assessment & Plan Note (Signed)
 More bothersome  Aching at night  Has had one inj in past   Referral done to orthopedics

## 2023-07-25 NOTE — Telephone Encounter (Signed)
 Seen today.

## 2023-07-25 NOTE — Telephone Encounter (Signed)
 Copied from CRM [id number]. Topic: Clinical - Red Word Triage >> Jul 25, 2023  8:08 AM Rosamond Comes wrote: Red Word that prompted transfer to Nurse Triage: patient husband Myrtie Atkinson calling in, not sleeping, pain chest, hips and legs.  Chief Complaint: body aches, husband states chronic pain Symptoms: pain in chest, hips, and legs Frequency: for 6 years Pertinent Negatives: Patient denies fever, cold like symptoms Disposition: [] ED /[] Urgent Care (no appt availability in office) / [x] Appointment(In office/virtual)/ []  [place]/ [] Home Care/ [] Refused Recommended Disposition /[] [facility]/ []  Follow-up with PCP Additional Notes: apt made per protocol ; care advice given, denies questions; instructed to go to ER if becomes worse.   Reason for Disposition  [1] MODERATE pain (e.g., interferes with normal activities) AND [2] present > 3 days  Answer Assessment - Initial Assessment Questions 1. ONSET: "When did the muscle aches or body pains start?"      6 years ago 2. LOCATION: "What part of your body is hurting?" (e.g., entire body, arms, legs)      Chest, hips and legs 3. SEVERITY: "How bad is the pain?" (Scale 1-10; or mild, moderate, severe)   - MILD (1-3): doesn't interfere with normal activities    - MODERATE (4-7): interferes with normal activities or awakens from sleep    - SEVERE (8-10):  excruciating pain, unable to do any normal activities      Moderate to severe 4. CAUSE: "What do you think is causing the pains?"     unknown 5. FEVER: "Have you been having fever?"     no 6. OTHER SYMPTOMS: "Do you have any other symptoms?" (e.g., chest pain, weakness, rash, cold or flu symptoms, weight loss)     no 7. PREGNANCY: "Is there any chance you are pregnant?" "When was your last menstrual period?"     na 8. TRAVEL: "Have you traveled out of the country in the last month?" (e.g., travel history, exposures)     no  Protocols used: Muscle Aches and Body Pain-A-AH

## 2023-07-25 NOTE — Assessment & Plan Note (Signed)
 May be multi factorial Not sleeping due to lower body pain   Will work on eval and treatment of knee oa and also neuropthy to see if this helps Follow up planned

## 2023-07-25 NOTE — Assessment & Plan Note (Signed)
 GAD7 is up  Taking fluoxetine  40 mg -works better the sertraline   Sleep is poor due to chronic pain   Plan follow up to address further

## 2023-07-25 NOTE — Patient Instructions (Addendum)
 Hold your losartan  for now to see if low blood pressure is causing light headedness Update us  and cardiology in 2 weeks with how you feel and watch blood pressure    For knee arthritis  I put the referral in for orthopedics  Please let us  know if you don't hear in 1-2 weeks    For neuropathy/ sleep and anxiety I want to try gabapentin   Try gabapentin 100 mg daily at bedtime (evening)  It can sedate/make you woozy so use caution and we will go slow with it  Update me in 1-2 weeks (or earlier) with how you are doing  If this starts to help we can slowly increase dose with time  Follow up in 1 month   We may discuss holding cholesterol medicine briefly if pain does not improve   If you get worse pain in calves when walking let us  know (that can be a circulation problem)

## 2023-07-25 NOTE — Assessment & Plan Note (Signed)
 Husband has severe depression/ is a stressor  He does not make effort to get better  Follow up to discuss further

## 2023-07-25 NOTE — Assessment & Plan Note (Signed)
 BP: (!) 116/58  Pulse Rate: 74   Some light headedness Reviewed cardiology note (did change from carvedilol  to metoprolol  xl 25 mg daily   Will hold losartan  and report back with how she is feeling

## 2023-07-25 NOTE — Progress Notes (Signed)
 Subjective:    Patient ID: Valerie Ball, female    DOB: [birth year], 78 y.o.   MRN: [id number]  HPI  Wt Readings from Last 3 Encounters:  07/25/23 144 lb 4 oz (65.4 kg)  07/11/23 142 lb 3.2 oz (64.5 kg)  05/07/23 142 lb 9.6 oz (64.7 kg)   26.38 kg/m  Vitals:   07/25/23 0929  BP: (!) 116/58  Pulse: 74  Temp: 98 F (36.7 C)  SpO2: 94%    Pt presents with c/o  Generalized body aches Chronic pain  Sleeping problems  Low blood pressure  Fatigue  Anxious and depressed   Chronic pain  Worse when she goes to bed at night  Knees are the worst -has OA , not being treated / saw Dr Geralyn Knee in the past - aspirated and injected left knee Helped a bit  Temporary  Legs ache and draw and are overactive at night Also stinging (diabetic neuropathy) -diabetes is well controlled  Has not been on medicine for this   Upper body is ok    Dm Lab Results  Component Value Date   HGBA1C 5.8 05/08/2023     BP Readings from Last 3 Encounters:  07/25/23 (!) 116/58  07/11/23 106/62  05/07/23 124/70   Pulse Readings from Last 3 Encounters:  07/25/23 74  07/11/23 80  05/07/23 84    For fatigue and low blood pressure her cardiologist changed her from carvedilol  to metoprolol  xl 25 mg daily  No difference  Made note that if blood pressure is still low would d/c losartan     Mood -changed since last visit  Thinks pain has created insomina /fatigue which impacts mood  Fluoxetine  40 mg daily  Sertraline  in past -caused fatigue and less motivation      02/28/2023    3:15 PM 01/30/2023   11:05 AM 12/29/2022   11:13 AM 05/11/2022   11:38 AM 03/10/2022    9:38 AM  Depression screen PHQ 2/9  Decreased Interest 3 3 3  0 0  Down, Depressed, Hopeless 3 3 1 1  0  PHQ - 2 Score 6 6 4 1  0  Altered sleeping 0 2 3 2    Tired, decreased energy 3 3 3 3    Change in appetite 0 0 2 1   Feeling bad or failure about yourself  0 0 0 0   Trouble concentrating 3 3 3 3    Moving slowly or  fidgety/restless 0 0 1 3   Suicidal thoughts 0 0 0 0   PHQ-9 Score 12 14 16 13    Difficult doing work/chores Not difficult at all Very difficult Very difficult Somewhat difficult       01/30/2023   11:05 AM 12/29/2022   11:13 AM 05/11/2022   11:38 AM  GAD 7 : Generalized Anxiety Score  Nervous, Anxious, on Edge 1 1 0  Control/stop worrying 1 1 2   Worry too much - different things 1 2 2   Trouble relaxing 1 2 2   Restless 1 0 2  Easily annoyed or irritable 2 1 1   Afraid - awful might happen 0 1 0  Total GAD 7 Score 7 8 9   Anxiety Difficulty Very difficult Somewhat difficult Somewhat difficult       Hyperlipidemia Rosuvastatin  20 mg daily  Zetia  10 mg daily   Takes plavix  for CAD    Takes ppi Lab Results  Component Value Date   VITAMINB12 243 05/04/2022   Magnesium low normal in past   Hypothyroid Lab  Results  Component Value Date   TSH 1.36 05/08/2023   Lab Results  Component Value Date   WBC 7.6 03/24/2023   HGB 12.2 03/24/2023   HCT 38.5 03/24/2023   MCV 86.5 03/24/2023   PLT 128 (L) 03/24/2023   Lab Results  Component Value Date   NA 139 03/27/2023   K 3.8 03/27/2023   CO2 25 03/27/2023   GLUCOSE 108 (H) 03/27/2023   BUN 22 03/27/2023   CREATININE 1.29 (H) 03/27/2023   CALCIUM  8.9 03/27/2023   GFR 46.38 (L) 05/04/2022   EGFR 51 (L) 03/05/2023   GFRNONAA 43 (L) 03/27/2023   Lab Results  Component Value Date   ALT 23 03/24/2023   AST 34 03/24/2023   ALKPHOS 47 03/24/2023   BILITOT 1.2 03/24/2023   Last vitamin D  Lab Results  Component Value Date   VD25OH 80 08/21/2012      Patient Active Problem List   Diagnosis Date Noted   Leg pain, bilateral 07/25/2023   Eye pain 03/23/2023   History of CAD (coronary artery disease) 03/23/2023   Chronic idiopathic thrombocytopenia (HCC) 03/23/2023   Mitral stenosis and aortic incompetence, by TTE 01/25/2023 02/01/2023   CAD (coronary artery disease)  CT Cardiac 01/19/2023 calcium  score 411, plaque  volume 366, moderate stenosis LAD, mild stenosis D1, RCA, LCx/OM 3; S/p PCI of Mid LAD 03/08/2023;rpt cath 03/27/2023 stable 02/01/2023   Poor balance 01/30/2023   Dyspnea on exertion 01/04/2023   Palpitations 01/04/2023   Allergy    Angiodysplasia of colon    Anxiety    Arthritis of both knees    Borderline diabetic    Colon polyp    Diverticulosis    GI bleed    Hepatic steatosis    Hiatal hernia    Hyperlipidemia    Hypertension    Macular edema    Menopause    Seizures (HCC)    Chest pain 12/29/2022   Bilateral knee pain 05/11/2022   Decreased calculated GFR 05/11/2022   Blurred vision, right eye 08/11/2021   History of COVID-19 09/24/2020   Fever, low grade 09/24/2020   Posterior vitreous detachment of right eye 11/06/2019   Cystoid macular edema of left eye 07/14/2019   Branch retinal vein occlusion with macular edema of left eye 07/14/2019   Hypertensive retinopathy of left eye 07/14/2019   Vitreous floaters of left eye 07/14/2019   Fatigue 05/20/2018   Hip pain, bilateral 08/31/2017   Vitamin B12 deficiency 09/21/2016   Diabetic gastroparesis (HCC) 03/27/2016   Retinal vein occlusion, branch 01/03/2016   Alternating constipation and diarrhea 08/12/2015   AVM (arteriovenous malformation) of colon 05/18/2015   IBS (irritable bowel syndrome) 02/28/2015   Diverticulitis of colon 10/14/2014   Gout 07/22/2014   Hypothyroidism 04/23/2014   Encounter for Medicare annual wellness exam 01/23/2014   Estrogen deficiency 01/23/2014   Colon cancer screening 01/23/2014   Tremor 01/20/2014   Dizziness 01/07/2014   Allergic rhinitis 01/07/2014   Thrombocytopenia (HCC) 11/25/2013   Diabetic neuropathy (HCC) 11/25/2013   History of removal of implants of both breasts 07/01/2013   Lap Nissen October 2013 01/19/2012   Large type III mixed hiatus hernia  12/06/2011   Stress reaction 10/25/2011   Non-insulin  treated type 2 diabetes mellitus (HCC) 07/25/2010   TRANSAMINASES,  SERUM, ELEVATED 11/11/2009   History of breast cancer 08/02/2009   ESOPHAGEAL STRICTURE 03/02/2008   Diaphragmatic hernia 03/02/2008   Peritoneal adhesions 03/02/2008   Anemia 02/07/2008   Melena 01/2008  Poorly controlled type 2 diabetes mellitus with neuropathy (HCC) 08/19/2007   GAD (generalized anxiety disorder) 04/15/2007   DISORDERS, ORGANIC SLEEP NEC 08/23/2006   SYNDROME, RESTLESS LEGS 08/23/2006   SYNDROME, CARPAL TUNNEL 08/23/2006   Hyperlipidemia associated with type 2 diabetes mellitus (HCC) 08/22/2006   Essential hypertension 08/22/2006   Allergic rhinitis 08/22/2006   Fatty liver 08/22/2006   History of seizure 08/22/2006   Colon stricture (HCC) 01/14/2003   Past Medical History:  Diagnosis Date   Allergic rhinitis 08/22/2006   Qualifier: Diagnosis of   By: Lennice Quivers CMA, [name] Patient      IMO SNOMED Dx Update Oct 2024     Allergy    allegic rhinitis   Alternating constipation and diarrhea 08/12/2015   Anemia    Angiodysplasia of colon    Anxiety    Arthritis    AVM (arteriovenous malformation) of colon    Bilateral knee pain 05/11/2022   Blurred vision, right eye 08/11/2021   Patient does have blurred vision right eye,     Borderline diabetic    per patient medical history form   Branch retinal vein occlusion with macular edema of left eye 07/14/2019   CME superior to the fovea OS has worsened at 7-week interval from macular branch retinal vein occlusion, repeat Avastin  today and evaluate exam in 6 weeks     Breast cancer (HCC) 02/2009   breast CA L invasive ductal CA(hormone receptor positive.)   CAD (coronary artery disease)  CT Cardiac 01/19/2023 calcium  score 411, plaque volume 366, moderate stenosis LAD, mild stenosis D1, RCA, LCx/OM 3 02/01/2023   Chest pain 12/29/2022   Chronic idiopathic thrombocytopenia (HCC) 03/23/2023   Colon cancer screening 01/23/2014   Colon polyp    hyperplastic   Colon stricture (HCC) 01/14/2003   Cystoid macular edema of  left eye 07/14/2019   Decreased calculated GFR 05/11/2022   Diabetic gastroparesis (HCC) 03/27/2016   Typical symptoms -suspect      No detectable diabetic retinopathy 12-27-2020     Diabetic neuropathy (HCC) 11/25/2013   Diaphragmatic hernia 03/02/2008   Qualifier: Diagnosis of   By: Celestia Colander CMA Nonie Beady), Leisha      IMO SNOMED Dx Update Oct 2024     Diverticulitis of colon 10/14/2014   Diverticulosis    Dizziness 01/07/2014   Dyspnea on exertion 01/04/2023   Encounter for Medicare annual wellness exam 01/23/2014   ESOPHAGEAL STRICTURE 03/02/2008   Qualifier: Diagnosis of   By: Celestia Colander CMA (AAMA), Christine Cozier         Essential hypertension 08/22/2006   Qualifier: Diagnosis of   By: Lennice Quivers CMA, [name] Patient         Estrogen deficiency 01/23/2014   Eye pain 03/23/2023   Fatigue 05/20/2018   Fatty liver    Fever, low grade 09/24/2020   GAD (generalized anxiety disorder) 04/15/2007   Qualifier: Diagnosis of   By: Mildred All FNP, Gwyn Leos        GI bleed    Gout    Hepatic steatosis    Hiatal hernia    Hip pain, bilateral 08/31/2017   History of breast cancer 08/02/2009   Qualifier: Diagnosis of   By: Malissa Se MD, Dannette Dutch        History of CAD (coronary artery disease) 03/23/2023   History of COVID-19 09/24/2020   History of removal of implants of both breasts 07/01/2013   06/25/13 elected to have bilateral breast implants removed.     History of seizure 08/22/2006  Qualifier: Diagnosis of   By: Lennice Quivers CMA, Nonie Beady, Laurie         Hyperlipidemia    Hyperlipidemia associated with type 2 diabetes mellitus (HCC) 08/22/2006   Qualifier: Diagnosis of   By: Lennice Quivers CMA, [name] Patient         Hypertension    Hypertensive retinopathy of left eye 07/14/2019   Hypothyroidism    IBS (irritable bowel syndrome)    Lap Nissen October 2013 01/19/2012   Laparoscopic takedown of large type III mixed hiatus hernia with primary closure of the diaphragm with 3 sutures posteriorally (post 2 with pledgets)  and Nissen fundoplication over a #56 lighted bougie        Large type III mixed hiatus hernia  12/06/2011   Plan surgery to repair diaphragm and do Nissen fundoplication     Macular edema    left eye   Melena 01/2008   anemia transfusion   Menopause    per patient medical history form   Mitral stenosis and aortic incompetence, by TTE 01/25/2023 02/01/2023   Non-insulin  treated type 2 diabetes mellitus (HCC) 07/25/2010   Ask patient to clarify. Type not noted on medical history form.     Palpitations 01/04/2023   Peritoneal adhesions 03/02/2008   Qualifier: Diagnosis of   By: Celestia Colander CMA Nonie Beady), Christine Cozier      IMO SNOMED Dx Update Oct 2024     Poor balance 01/30/2023   Poorly controlled type 2 diabetes mellitus with neuropathy (HCC) 08/19/2007   Qualifier: Diagnosis of   By: Malissa Se MD, Dannette Dutch        Posterior vitreous detachment of right eye 11/06/2019   Postmenopausal hormone therapy    per patient medical history form.   Retinal vein occlusion, branch 01/03/2016   Left with hemorrhage and edema (Dr Seward Dao) 2017     Routine general medical examination at a health care facility 07/11/2011   Salmonella 05/2000   renal effects secondary to dehydration   Seizure disorder (HCC)    Seizures (HCC)    one seizure several yrs ago --etiology unknown-no problem since   Stress reaction 10/25/2011   SYNDROME, CARPAL TUNNEL 08/23/2006   Qualifier: Diagnosis of   By: Malissa Se MD, Dannette Dutch        SYNDROME, RESTLESS LEGS 08/23/2006   Qualifier: Diagnosis of   By: Malissa Se MD, Dannette Dutch        Thrombocytopenia (HCC) 11/25/2013   TRANSAMINASES, SERUM, ELEVATED 11/11/2009   Qualifier: Diagnosis of   By: Malissa Se MD, Dannette Dutch        Tremor 01/20/2014   Head and right hand      Vitamin B12 deficiency 09/21/2016   Vitreous floaters of left eye 07/14/2019   The nature of vitreous floaters was discussed with the patient as well as their frequent occurrence with development of posterior vitreous detachment.  The significance of flashes and field cuts was discussed with the patient. The need for a dilated fundus examination was discussed and advice given to return immediately for new or different floaters .  More uncommonly, vitreous floaters, debris, or   Past Surgical History:  Procedure Laterality Date   ABDOMINAL HYSTERECTOMY  1983   BREAST SURGERY  02/2009   breast biopsy invasive ductal CA-bilateral mastectomies   CORONARY PRESSURE/FFR STUDY N/A 03/27/2023   Procedure: CORONARY PRESSURE/FFR STUDY;  Surgeon: Kyra Phy, MD;  Location: [facility];  Service: Cardiovascular;  Laterality: N/A;   CORONARY STENT INTERVENTION N/A 03/08/2023   Procedure:  CORONARY STENT INTERVENTION;  Surgeon: Odie Benne, MD;  Location: [facility];  Service: Cardiovascular;  Laterality: N/A;   EPIGASTRIC HERNIA REPAIR  01/02/2012   Procedure: HERNIA REPAIR EPIGASTRIC ADULT;  Surgeon: Azucena Bollard, MD;  Location: [facility];  Service: General;  Laterality: N/A;  Laparoscopic Repair Paraesophageal Hernia, Nissen   EYE SURGERY Left 07/2017   Dr. Reatha Campanile CYST EXCISION     LAPAROSCOPIC NISSEN FUNDOPLICATION  01/02/2012   Procedure: LAPAROSCOPIC NISSEN FUNDOPLICATION;  Surgeon: Azucena Bollard, MD;  Location: [facility];  Service: General;  Laterality: N/A;   LEFT HEART CATH AND CORONARY ANGIOGRAPHY N/A 03/08/2023   Procedure: LEFT HEART CATH AND CORONARY ANGIOGRAPHY;  Surgeon: Odie Benne, MD;  Location: [facility];  Service: Cardiovascular;  Laterality: N/A;   LEFT HEART CATH AND CORONARY ANGIOGRAPHY N/A 03/27/2023   Procedure: LEFT HEART CATH AND CORONARY ANGIOGRAPHY;  Surgeon: Kyra Phy, MD;  Location: [facility];  Service: Cardiovascular;  Laterality: N/A;   MASTECTOMY  04/2009   Bilateral for ductal carcinoma on L   OVARY SURGERY  1986   ovarian tumor removed   TUBAL LIGATION     Social History   Tobacco Use   Smoking status: Never    Smokeless tobacco: Never  Vaping Use   Vaping status: Never Used  Substance Use Topics   Alcohol use: Yes    Comment: occasional   Drug use: Never   Family History  Problem Relation Age of Onset   Hypertension Mother    Stroke Mother    Breast cancer Mother    Heart attack Father    Diabetes Father    Esophageal cancer Father    Breast cancer Sister        x 2 sisters   Diabetes Sister    Hypertension Sister    Breast cancer Sister    Hypertension Brother    Diabetes Brother    Allergies  Allergen Reactions   Arimidex [Anastrozole] Other (See Comments)    Joint pain   Glipizide  Other (See Comments)    Hypoglycemia    Metformin  And Related Other (See Comments)    diarrhea   Penicillins Hives and Rash   Current Outpatient Medications on File Prior to Visit  Medication Sig Dispense Refill   amLODipine  (NORVASC ) 2.5 MG tablet Take 1 tablet (2.5 mg total) by mouth daily. 90 tablet 2   aspirin  EC 81 MG tablet Take 1 tablet (81 mg total) by mouth daily. Swallow whole.     Calcium  Carbonate (CALCIUM  500 PO) Take 500 mg by mouth daily.     Cholecalciferol (VITAMIN D3) 1000 units CAPS Take 1,000 Units by mouth daily.     clopidogrel  (PLAVIX ) 75 MG tablet Take 1 tablet (75 mg total) by mouth daily. 90 tablet 3   Cyanocobalamin  (B-12) 1000 MCG TABS Take 1,000 mcg by mouth daily.     Dulaglutide  (TRULICITY ) 3 MG/0.5ML SOPN INJECT 0.5 ML (3 MG) UNDER THE SKIN ONCE A WEEK 6 mL 3   empagliflozin  (JARDIANCE ) 25 MG TABS tablet Take 1 tablet (25 mg total) by mouth daily before breakfast. 90 tablet 3   ezetimibe  (ZETIA ) 10 MG tablet TAKE 1 TABLET DAILY 90 tablet 3   famotidine  (PEPCID ) 20 MG tablet TAKE 1 TABLET TWICE A DAY 180 tablet 1   FLUoxetine  (PROZAC ) 40 MG capsule Take 1 capsule (40 mg total) by mouth daily. 90 capsule 1   fluticasone  (FLONASE )  50 MCG/ACT nasal spray USE 2 SPRAYS IN EACH NOSTRIL DAILY AS NEEDED FOR ALLERGIES 48 g 0   isosorbide  mononitrate (IMDUR ) 30 MG 24 hr  tablet Take 1 tablet (30 mg total) by mouth daily. 90 tablet 3   levothyroxine  (SYNTHROID ) 50 MCG tablet Take 1 tablet (50 mcg total) by mouth daily before breakfast. 90 tablet 3   metoprolol  succinate (TOPROL -XL) 25 MG 24 hr tablet Take 1 tablet (25 mg total) by mouth daily. Take with or immediately following a meal. 90 tablet 3   Multiple Vitamin (MULTIVITAMIN WITH MINERALS) TABS tablet Take 1 tablet by mouth daily.     nitroGLYCERIN  (NITROSTAT ) 0.4 MG SL tablet Place 1 tablet (0.4 mg total) under the tongue every 5 (five) minutes as needed for chest pain. 25 tablet 1   pantoprazole  (PROTONIX ) 20 MG tablet Take 1 tablet (20 mg total) by mouth daily. 30 tablet 5   repaglinide  (PRANDIN ) 1 MG tablet TAKE 1 TABLET DAILY BEFORE SUPPER 90 tablet 3   rOPINIRole  (REQUIP ) 1 MG tablet TAKE 1 TABLET AT BEDTIME 90 tablet 3   rosuvastatin  (CRESTOR ) 20 MG tablet Take 1 tablet (20 mg total) by mouth daily. 90 tablet 3   losartan  (COZAAR ) 25 MG tablet Take 1 tablet (25 mg total) by mouth daily. 90 tablet 2   No current facility-administered medications on file prior to visit.    Review of Systems  Constitutional:  Positive for fatigue. Negative for activity change, appetite change, fever and unexpected weight change.  HENT:  Negative for congestion, ear pain, rhinorrhea, sinus pressure and sore throat.   Eyes:  Negative for pain, redness and visual disturbance.  Respiratory:  Negative for cough, shortness of breath and wheezing.   Cardiovascular:  Negative for chest pain and palpitations.  Gastrointestinal:  Negative for abdominal pain, blood in stool, constipation and diarrhea.  Endocrine: Negative for polydipsia and polyuria.  Genitourinary:  Negative for dysuria, frequency and urgency.  Musculoskeletal:  Positive for arthralgias and myalgias. Negative for back pain.  Skin:  Negative for pallor and rash.  Allergic/Immunologic: Negative for environmental allergies.  Neurological:  Positive for numbness.  Negative for dizziness, syncope and headaches.  Hematological:  Negative for adenopathy. Does not bruise/bleed easily.  Psychiatric/Behavioral:  Positive for decreased concentration. Negative for dysphoric mood. The patient is nervous/anxious.        Objective:   Physical Exam Constitutional:      General: She is not in acute distress.    Appearance: Normal appearance. She is well-developed and normal weight. She is not ill-appearing or diaphoretic.  HENT:     Head: Normocephalic and atraumatic.     Mouth/Throat:     Mouth: Mucous membranes are moist.     Pharynx: Oropharynx is clear.  Eyes:     General: No scleral icterus.    Conjunctiva/sclera: Conjunctivae normal.     Pupils: Pupils are equal, round, and reactive to light.  Neck:     Thyroid : No thyromegaly.     Vascular: No carotid bruit or JVD.  Cardiovascular:     Rate and Rhythm: Normal rate and regular rhythm.     Heart sounds: Murmur heard.     No gallop.     Comments: Pedal pulses are faint Feet are warm  Pulmonary:     Effort: Pulmonary effort is normal. No respiratory distress.     Breath sounds: Normal breath sounds. No stridor. No wheezing, rhonchi or rales.  Abdominal:     General: There is  no distension or abdominal bruit.     Palpations: Abdomen is soft. There is no mass.     Tenderness: There is no abdominal tenderness. There is no guarding or rebound.     Hernia: No hernia is present.  Musculoskeletal:     Cervical back: Normal range of motion and neck supple.     Right lower leg: No edema.     Left lower leg: No edema.     Comments: Limited rom knees  Med joint line tenderness-both knees   Dec sens to lt touch in feet today   Normal rom hips   No calf tenderness No palp cords   Lymphadenopathy:     Cervical: No cervical adenopathy.  Skin:    General: Skin is warm and dry.     Coloration: Skin is not pale.     Findings: No rash.  Neurological:     Mental Status: She is alert.     Motor: No  weakness.     Coordination: Coordination normal.     Gait: Gait normal.     Deep Tendon Reflexes: Reflexes are normal and symmetric. Reflexes normal.  Psychiatric:        Attention and Perception: Attention normal.        Mood and Affect: Mood is anxious and depressed.     Comments: Candidly discusses symptoms and stressors             Assessment & Plan:   Problem List Items Addressed This Visit       Cardiovascular and Mediastinum   Essential hypertension   BP: (!) 116/58  Pulse Rate: 74   Some light headedness Reviewed cardiology note (did change from carvedilol  to metoprolol  xl 25 mg daily   Will hold losartan  and report back with how she is feeling         Endocrine   Diabetic neuropathy (HCC)   Keeping her up at night   Will try gabapentin 100 mg at bedtime May also help sleep and anxiety   Discussed possible side effects in detail  Watch for sedation/falls Consider increase dose if well tolerated  Follow up in a month        Musculoskeletal and Integument   Arthritis of both knees   More bothersome  Aching at night  Has had one inj in past   Referral done to orthopedics        Relevant Orders   Ambulatory referral to Orthopedic Surgery     Other   Stress reaction   Husband has severe depression/ is a stressor  He does not make effort to get better  Follow up to discuss further       Leg pain, bilateral - Primary   Suspect both OA knees and neuropathy are in play  Ref to ortho for OA  Trial of low dose gabapentin for neuropathy   Follow up 1 month   If not improved-consider statin as cause? Doubtful at this point  Also no claudication -reassuring       Relevant Orders   Ambulatory referral to Orthopedic Surgery   GAD (generalized anxiety disorder)   GAD7 is up  Taking fluoxetine  40 mg -works better the sertraline   Sleep is poor due to chronic pain   Plan follow up to address further       Fatigue   May be multi  factorial Not sleeping due to lower body pain   Will work on eval and treatment of knee oa  and also neuropthy to see if this helps Follow up planned

## 2023-07-25 NOTE — Assessment & Plan Note (Signed)
 Keeping her up at night   Will try gabapentin 100 mg at bedtime May also help sleep and anxiety   Discussed possible side effects in detail  Watch for sedation/falls Consider increase dose if well tolerated  Follow up in a month

## 2023-07-25 NOTE — Assessment & Plan Note (Signed)
 Suspect both OA knees and neuropathy are in play  Ref to ortho for OA  Trial of low dose gabapentin for neuropathy   Follow up 1 month   If not improved-consider statin as cause? Doubtful at this point  Also no claudication -reassuring

## 2023-07-30 DIAGNOSIS — Z955 Presence of coronary angioplasty implant and graft: Secondary | ICD-10-CM | POA: Diagnosis not present

## 2023-08-01 DIAGNOSIS — Z955 Presence of coronary angioplasty implant and graft: Secondary | ICD-10-CM | POA: Diagnosis not present

## 2023-08-02 ENCOUNTER — Encounter: Payer: Self-pay | Admitting: Family Medicine

## 2023-08-03 ENCOUNTER — Other Ambulatory Visit: Payer: Self-pay | Admitting: Family Medicine

## 2023-08-03 NOTE — Telephone Encounter (Signed)
 Both last filled on 06/30/22 #90 tabs/ 3 refills, last OV 5/7 Acute for body aches/ pain

## 2023-08-14 ENCOUNTER — Telehealth: Payer: Self-pay

## 2023-08-14 DIAGNOSIS — G8929 Other chronic pain: Secondary | ICD-10-CM

## 2023-08-14 NOTE — Telephone Encounter (Signed)
 Spoke to patient and gave her the address to have xrays she agreed to go and orders are in

## 2023-08-15 ENCOUNTER — Ambulatory Visit
Admission: RE | Admit: 2023-08-15 | Discharge: 2023-08-15 | Disposition: A | Discharge status: Home / Self Care | Source: Ambulatory Visit | Attending: Orthopedic Surgery | Admitting: Orthopedic Surgery

## 2023-08-15 ENCOUNTER — Ambulatory Visit
Admission: RE | Admit: 2023-08-15 | Discharge: 2023-08-15 | Disposition: A | Discharge status: Home / Self Care | Attending: Orthopedic Surgery | Admitting: Orthopedic Surgery

## 2023-08-15 ENCOUNTER — Other Ambulatory Visit: Payer: Self-pay | Admitting: Oncology

## 2023-08-15 ENCOUNTER — Ambulatory Visit (INDEPENDENT_AMBULATORY_CARE_PROVIDER_SITE_OTHER): Admitting: Orthopedic Surgery

## 2023-08-15 VITALS — BP 114/68 | Ht 62.0 in | Wt 146.0 lb

## 2023-08-15 DIAGNOSIS — G8929 Other chronic pain: Secondary | ICD-10-CM | POA: Diagnosis not present

## 2023-08-15 DIAGNOSIS — M1712 Unilateral primary osteoarthritis, left knee: Secondary | ICD-10-CM | POA: Diagnosis not present

## 2023-08-15 DIAGNOSIS — M25561 Pain in right knee: Secondary | ICD-10-CM | POA: Diagnosis not present

## 2023-08-15 DIAGNOSIS — D696 Thrombocytopenia, unspecified: Secondary | ICD-10-CM

## 2023-08-15 DIAGNOSIS — M7652 Patellar tendinitis, left knee: Secondary | ICD-10-CM

## 2023-08-15 DIAGNOSIS — M1711 Unilateral primary osteoarthritis, right knee: Secondary | ICD-10-CM | POA: Diagnosis not present

## 2023-08-15 DIAGNOSIS — M7651 Patellar tendinitis, right knee: Secondary | ICD-10-CM | POA: Diagnosis not present

## 2023-08-15 DIAGNOSIS — M25562 Pain in left knee: Secondary | ICD-10-CM | POA: Insufficient documentation

## 2023-08-15 NOTE — Patient Instructions (Signed)
 Can contact the office if interested in PT for your knees.  We can also send a referral to your current PT.

## 2023-08-15 NOTE — Progress Notes (Unsigned)
 [facility]  [address] [place],  [place]  [address] [phone]  Clinic Day:  08/16/2023  Referring physician: Tower, Manley Seeds, MD   HISTORY OF PRESENT ILLNESS:  Valerie Ball is a 78 y.o. female with mild thrombocytopenia. She comes in today to reassess her thrombocytopenia.  Since her last visit, the patient has been doing well.  She denies having any spontaneous bleeding/bruising issues which concern her for severe thrombocytopenia being present.  The only changes since her visit last year was that she had 1 cardiac stent placed.  VITALS:  Blood pressure 138/70, pulse 68, temperature 98.4 F (36.9 C), temperature source Oral, resp. rate 14, height 5\' 2"  (1.575 m), weight 145 lb 6.4 oz (66 kg), last menstrual period 03/20/1981, SpO2 95%.  Wt Readings from Last 3 Encounters:  08/16/23 145 lb 6.4 oz (66 kg)  08/15/23 146 lb (66.2 kg)  07/25/23 144 lb 4 oz (65.4 kg)    Body mass index is 26.59 kg/m.  Performance status (ECOG): 1 - Symptomatic but completely ambulatory  PHYSICAL EXAM:  Physical Exam Constitutional:      General: She is not in acute distress.    Appearance: Normal appearance. She is normal weight. She is not ill-appearing, toxic-appearing or diaphoretic.  HENT:     Head: Normocephalic and atraumatic.     Right Ear: Tympanic membrane normal.     Left Ear: Tympanic membrane normal.     Nose: Nose normal. No congestion or rhinorrhea.     Mouth/Throat:     Mouth: Mucous membranes are moist.     Pharynx: Oropharynx is clear. No oropharyngeal exudate or posterior oropharyngeal erythema.  Eyes:     General: No scleral icterus.       Right eye: No discharge.        Left eye: No discharge.     Extraocular Movements: Extraocular movements intact.     Conjunctiva/sclera: Conjunctivae normal.     Pupils: Pupils are equal, round, and reactive to light.  Neck:     Vascular: No carotid bruit.  Cardiovascular:     Rate and  Rhythm: Normal rate and regular rhythm.     Heart sounds: No murmur heard.    No friction rub. No gallop.  Pulmonary:     Effort: Pulmonary effort is normal. No respiratory distress.     Breath sounds: Normal breath sounds. No stridor. No wheezing, rhonchi or rales.  Chest:     Chest wall: No tenderness.  Abdominal:     General: Abdomen is flat. Bowel sounds are normal. There is no distension.     Palpations: There is no mass.     Tenderness: There is no abdominal tenderness. There is no right CVA tenderness, left CVA tenderness, guarding or rebound.     Hernia: No hernia is present.  Musculoskeletal:        General: No swelling, tenderness, deformity or signs of injury. Normal range of motion.     Cervical back: Normal range of motion and neck supple. No rigidity or tenderness.     Right lower leg: No edema.     Left lower leg: No edema.  Lymphadenopathy:     Cervical: No cervical adenopathy.  Skin:    General: Skin is warm and dry.     Capillary Refill: Capillary refill takes less than 2 seconds.     Coloration: Skin is not jaundiced or pale.     Findings: No bruising, erythema, lesion or  rash.  Neurological:     General: No focal deficit present.     Mental Status: She is alert and oriented to person, place, and time. Mental status is at baseline.     Cranial Nerves: No cranial nerve deficit.     Sensory: No sensory deficit.     Motor: No weakness.     Coordination: Coordination normal.     Gait: Gait normal.     Deep Tendon Reflexes: Reflexes normal.  Psychiatric:        Mood and Affect: Mood normal.        Behavior: Behavior normal.        Thought Content: Thought content normal.        Judgment: Judgment normal.      LABS:      Latest Ref Rng & Units 08/16/2023    9:52 AM 03/24/2023    2:31 AM 03/23/2023    5:18 PM  CBC  WBC 4.0 - 10.5 K/uL 4.2  7.6  8.2   Hemoglobin 12.0 - 15.0 g/dL 59.5  63.8  75.6   Hematocrit 36.0 - 46.0 % 37.5  38.5  44.7   Platelets 150 -  400 K/uL 89  128  166       Latest Ref Rng & Units 08/16/2023    9:52 AM 03/27/2023    2:51 AM 03/26/2023    2:19 AM  CMP  Glucose 70 - 99 mg/dL 433  295  188   BUN 8 - 23 mg/dL 21  22  23    Creatinine 0.44 - 1.00 mg/dL 4.16  6.06  3.01   Sodium 135 - 145 mmol/L 143  139  140   Potassium 3.5 - 5.1 mmol/L 4.0  3.8  3.8   Chloride 98 - 111 mmol/L 107  104  105   CO2 22 - 32 mmol/L 23  25  24    Calcium  8.9 - 10.3 mg/dL 9.6  8.9  8.8   Total Protein 6.5 - 8.1 g/dL 6.9     Total Bilirubin 0.0 - 1.2 mg/dL 0.4     Alkaline Phos 38 - 126 U/L 78     AST 15 - 41 U/L 30     ALT 0 - 44 U/L 19      ASSESSMENT & PLAN:  A 78 y.o. female with mild thrombocytopenia.  Her platelet count of 89 today is not much different than what it has been in the past.  Hematologically, the patient has no pressing issues which have me concerned for a more ominous process being present..  Clinically, the patient is doing well.  As that is the case, I will see her back in 1 year for repeat clinical assessment.  The patient understands all the plans discussed today and is in agreement with them.  Larenz Frasier Felicia Horde, MD

## 2023-08-16 ENCOUNTER — Other Ambulatory Visit: Payer: Self-pay | Admitting: Oncology

## 2023-08-16 ENCOUNTER — Inpatient Hospital Stay: Payer: Medicare HMO | Attending: Oncology | Admitting: Oncology

## 2023-08-16 ENCOUNTER — Inpatient Hospital Stay: Payer: Medicare HMO

## 2023-08-16 ENCOUNTER — Other Ambulatory Visit: Payer: Self-pay

## 2023-08-16 ENCOUNTER — Telehealth: Payer: Self-pay | Admitting: Oncology

## 2023-08-16 VITALS — BP 138/70 | HR 68 | Temp 98.4°F | Resp 14 | Ht 62.0 in | Wt 145.4 lb

## 2023-08-16 DIAGNOSIS — D696 Thrombocytopenia, unspecified: Secondary | ICD-10-CM

## 2023-08-16 DIAGNOSIS — Z79899 Other long term (current) drug therapy: Secondary | ICD-10-CM | POA: Diagnosis not present

## 2023-08-16 LAB — CMP (CANCER CENTER ONLY)
ALT: 19 U/L (ref 0–44)
AST: 30 U/L (ref 15–41)
Albumin: 4.3 g/dL (ref 3.5–5.0)
Alkaline Phosphatase: 78 U/L (ref 38–126)
Anion gap: 13 (ref 5–15)
BUN: 21 mg/dL (ref 8–23)
CO2: 23 mmol/L (ref 22–32)
Calcium: 9.6 mg/dL (ref 8.9–10.3)
Chloride: 107 mmol/L (ref 98–111)
Creatinine: 1.05 mg/dL — ABNORMAL HIGH (ref 0.44–1.00)
GFR, Estimated: 55 mL/min — ABNORMAL LOW (ref >60)
Glucose, Bld: 106 mg/dL — ABNORMAL HIGH (ref 70–99)
Potassium: 4 mmol/L (ref 3.5–5.1)
Sodium: 143 mmol/L (ref 135–145)
Total Bilirubin: 0.4 mg/dL (ref 0.0–1.2)
Total Protein: 6.9 g/dL (ref 6.5–8.1)

## 2023-08-16 LAB — CBC WITH DIFFERENTIAL (CANCER CENTER ONLY)
Abs Immature Granulocytes: 0.02 10*3/uL (ref 0.00–0.07)
Basophils Absolute: 0 10*3/uL (ref 0.0–0.1)
Basophils Relative: 0 %
Eosinophils Absolute: 0.1 10*3/uL (ref 0.0–0.5)
Eosinophils Relative: 1 %
HCT: 37.5 % (ref 36.0–46.0)
Hemoglobin: 12.3 g/dL (ref 12.0–15.0)
Immature Granulocytes: 1 %
Lymphocytes Relative: 27 %
Lymphs Abs: 1.1 10*3/uL (ref 0.7–4.0)
MCH: 27.7 pg (ref 26.0–34.0)
MCHC: 32.8 g/dL (ref 30.0–36.0)
MCV: 84.5 fL (ref 80.0–100.0)
Monocytes Absolute: 0.3 10*3/uL (ref 0.1–1.0)
Monocytes Relative: 8 %
Neutro Abs: 2.6 10*3/uL (ref 1.7–7.7)
Neutrophils Relative %: 63 %
Platelet Count: 89 10*3/uL — ABNORMAL LOW (ref 150–400)
RBC: 4.44 MIL/uL (ref 3.87–5.11)
RDW: 15.4 % (ref 11.5–15.5)
WBC Count: 4.2 10*3/uL (ref 4.0–10.5)
nRBC: 0 % (ref 0.0–0.2)

## 2023-08-16 NOTE — Telephone Encounter (Signed)
 Patient has been scheduled for follow-up visit per 08/16/23 LOS.  Pt given an appt calendar with date and time.

## 2023-08-17 ENCOUNTER — Encounter: Payer: Self-pay | Admitting: Orthopedic Surgery

## 2023-08-17 NOTE — Progress Notes (Signed)
 New Patient Visit  Assessment: Valerie Ball is a 78 y.o. female with the following: 1. Patellar tendinitis of both knees  Plan: Diamond Formica has pain in both knees.  Pain is primarily in the anterior aspect of the knee.  She has point tenderness within the patellar tendon on the left.  She is able to maintain a straight leg raise bilaterally.  Symptoms are worse on the left.  I do not think that an injection would be beneficial at this time.  We discussed proceeding with medications, topical treatments, bracing and consideration for physical therapy.  If she continues to have issues, she can return to clinic.  Follow-up: No follow-ups on file.  Subjective:  Chief Complaint  Patient presents with   Knee Pain    Bilateral knee pain for 2 months now  she had an injection 3 years ago and did not help  now it bothers her at night and can not sleep  walking pops at times , stairs are hard and hard to sit to stand     History of Present Illness: Valerie Ball is a 78 y.o. female who has been referred by Deri Fleet, MD for evaluation of bilateral knee pain.  Left is worse than the right.  No specific injury.  She has previously had injections in both knees, with limited improvement in her symptoms.  She localizes the pain to the anterior aspect of both knees.  Medications have not been effective.  She is currently working with cardiac rehab, but not doing any specific for her knees.   Review of Systems: No fevers or chills No numbness or tingling No chest pain No shortness of breath No bowel or bladder dysfunction No GI distress No headaches   Medical History:  Past Medical History:  Diagnosis Date   Allergic rhinitis 08/22/2006   Qualifier: Diagnosis of   By: Lennice Quivers CMA, [name] Patient      IMO SNOMED Dx Update Oct 2024     Allergy    allegic rhinitis   Alternating constipation and diarrhea 08/12/2015   Anemia    Angiodysplasia of colon    Anxiety    Arthritis     AVM (arteriovenous malformation) of colon    Bilateral knee pain 05/11/2022   Blurred vision, right eye 08/11/2021   Patient does have blurred vision right eye,     Borderline diabetic    per patient medical history form   Branch retinal vein occlusion with macular edema of left eye 07/14/2019   CME superior to the fovea OS has worsened at 7-week interval from macular branch retinal vein occlusion, repeat Avastin  today and evaluate exam in 6 weeks     Breast cancer (HCC) 02/2009   breast CA L invasive ductal CA(hormone receptor positive.)   CAD (coronary artery disease)  CT Cardiac 01/19/2023 calcium  score 411, plaque volume 366, moderate stenosis LAD, mild stenosis D1, RCA, LCx/OM 3 02/01/2023   Chest pain 12/29/2022   Chronic idiopathic thrombocytopenia (HCC) 03/23/2023   Colon cancer screening 01/23/2014   Colon polyp    hyperplastic   Colon stricture (HCC) 01/14/2003   Cystoid macular edema of left eye 07/14/2019   Decreased calculated GFR 05/11/2022   Diabetic gastroparesis (HCC) 03/27/2016   Typical symptoms -suspect      No detectable diabetic retinopathy 12-27-2020     Diabetic neuropathy (HCC) 11/25/2013   Diaphragmatic hernia 03/02/2008   Qualifier: Diagnosis of   By: Kowalk CMA (AAMA), Christine Cozier  IMO SNOMED Dx Update Oct 2024     Diverticulitis of colon 10/14/2014   Diverticulosis    Dizziness 01/07/2014   Dyspnea on exertion 01/04/2023   Encounter for Medicare annual wellness exam 01/23/2014   ESOPHAGEAL STRICTURE 03/02/2008   Qualifier: Diagnosis of   By: Celestia Colander CMA (AAMA), Christine Cozier         Essential hypertension 08/22/2006   Qualifier: Diagnosis of   By: Lennice Quivers CMA, [name] Patient         Estrogen deficiency 01/23/2014   Eye pain 03/23/2023   Fatigue 05/20/2018   Fatty liver    Fever, low grade 09/24/2020   GAD (generalized anxiety disorder) 04/15/2007   Qualifier: Diagnosis of   By: Mildred All FNP, Gwyn Leos        GI bleed    Gout    Hepatic steatosis     Hiatal hernia    Hip pain, bilateral 08/31/2017   History of breast cancer 08/02/2009   Qualifier: Diagnosis of   By: Malissa Se MD, Dannette Dutch        History of CAD (coronary artery disease) 03/23/2023   History of COVID-19 09/24/2020   History of removal of implants of both breasts 07/01/2013   06/25/13 elected to have bilateral breast implants removed.     History of seizure 08/22/2006   Qualifier: Diagnosis of   By: Lennice Quivers CMA, [name] Patient         Hyperlipidemia    Hyperlipidemia associated with type 2 diabetes mellitus (HCC) 08/22/2006   Qualifier: Diagnosis of   By: Lennice Quivers CMA, [name] Patient         Hypertension    Hypertensive retinopathy of left eye 07/14/2019   Hypothyroidism    IBS (irritable bowel syndrome)    Lap Nissen October 2013 01/19/2012   Laparoscopic takedown of large type III mixed hiatus hernia with primary closure of the diaphragm with 3 sutures posteriorally (post 2 with pledgets) and Nissen fundoplication over a #56 lighted bougie        Large type III mixed hiatus hernia  12/06/2011   Plan surgery to repair diaphragm and do Nissen fundoplication     Macular edema    left eye   Melena 01/2008   anemia transfusion   Menopause    per patient medical history form   Mitral stenosis and aortic incompetence, by TTE 01/25/2023 02/01/2023   Non-insulin  treated type 2 diabetes mellitus (HCC) 07/25/2010   Ask patient to clarify. Type not noted on medical history form.     Palpitations 01/04/2023   Peritoneal adhesions 03/02/2008   Qualifier: Diagnosis of   By: Celestia Colander CMA Nonie Beady), Christine Cozier      IMO SNOMED Dx Update Oct 2024     Poor balance 01/30/2023   Poorly controlled type 2 diabetes mellitus with neuropathy (HCC) 08/19/2007   Qualifier: Diagnosis of   By: Malissa Se MD, Dannette Dutch        Posterior vitreous detachment of right eye 11/06/2019   Postmenopausal hormone therapy    per patient medical history form.   Retinal vein occlusion, branch 01/03/2016   Left with  hemorrhage and edema (Dr Seward Dao) 2017     Routine general medical examination at a health care facility 07/11/2011   Salmonella 05/2000   renal effects secondary to dehydration   Seizure disorder (HCC)    Seizures (HCC)    one seizure several yrs ago --etiology unknown-no problem since   Stress reaction 10/25/2011   SYNDROME, CARPAL TUNNEL 08/23/2006  Qualifier: Diagnosis of   By: Malissa Se MD, Dannette Dutch        SYNDROME, RESTLESS LEGS 08/23/2006   Qualifier: Diagnosis of   By: Malissa Se MD, Dannette Dutch        Thrombocytopenia (HCC) 11/25/2013   TRANSAMINASES, SERUM, ELEVATED 11/11/2009   Qualifier: Diagnosis of   By: Malissa Se MD, Dannette Dutch        Tremor 01/20/2014   Head and right hand      Vitamin B12 deficiency 09/21/2016   Vitreous floaters of left eye 07/14/2019   The nature of vitreous floaters was discussed with the patient as well as their frequent occurrence with development of posterior vitreous detachment. The significance of flashes and field cuts was discussed with the patient. The need for a dilated fundus examination was discussed and advice given to return immediately for new or different floaters .  More uncommonly, vitreous floaters, debris, or    Past Surgical History:  Procedure Laterality Date   ABDOMINAL HYSTERECTOMY  1983   BREAST SURGERY  02/2009   breast biopsy invasive ductal CA-bilateral mastectomies   CORONARY PRESSURE/FFR STUDY N/A 03/27/2023   Procedure: CORONARY PRESSURE/FFR STUDY;  Surgeon: Kyra Phy, MD;  Location: [facility];  Service: Cardiovascular;  Laterality: N/A;   CORONARY STENT INTERVENTION N/A 03/08/2023   Procedure: CORONARY STENT INTERVENTION;  Surgeon: Odie Benne, MD;  Location: [facility];  Service: Cardiovascular;  Laterality: N/A;   EPIGASTRIC HERNIA REPAIR  01/02/2012   Procedure: HERNIA REPAIR EPIGASTRIC ADULT;  Surgeon: Azucena Bollard, MD;  Location: [facility];  Service: General;  Laterality: N/A;  Laparoscopic  Repair Paraesophageal Hernia, Nissen   EYE SURGERY Left 07/2017   Dr. Reatha Campanile CYST EXCISION     LAPAROSCOPIC NISSEN FUNDOPLICATION  01/02/2012   Procedure: LAPAROSCOPIC NISSEN FUNDOPLICATION;  Surgeon: Azucena Bollard, MD;  Location: [facility];  Service: General;  Laterality: N/A;   LEFT HEART CATH AND CORONARY ANGIOGRAPHY N/A 03/08/2023   Procedure: LEFT HEART CATH AND CORONARY ANGIOGRAPHY;  Surgeon: Odie Benne, MD;  Location: [facility];  Service: Cardiovascular;  Laterality: N/A;   LEFT HEART CATH AND CORONARY ANGIOGRAPHY N/A 03/27/2023   Procedure: LEFT HEART CATH AND CORONARY ANGIOGRAPHY;  Surgeon: Kyra Phy, MD;  Location: [facility];  Service: Cardiovascular;  Laterality: N/A;   MASTECTOMY  04/2009   Bilateral for ductal carcinoma on L   OVARY SURGERY  1986   ovarian tumor removed   TUBAL LIGATION      Family History  Problem Relation Age of Onset   Hypertension Mother    Stroke Mother    Breast cancer Mother    Heart attack Father    Diabetes Father    Esophageal cancer Father    Breast cancer Sister        x 2 sisters   Diabetes Sister    Hypertension Sister    Breast cancer Sister    Hypertension Brother    Diabetes Brother    Social History   Tobacco Use   Smoking status: Never   Smokeless tobacco: Never  Vaping Use   Vaping status: Never Used  Substance Use Topics   Alcohol use: Yes    Comment: occasional   Drug use: Never    Allergies  Allergen Reactions   Arimidex [Anastrozole] Other (See Comments)    Joint pain   Glipizide  Other (See Comments)    Hypoglycemia    Metformin   And Related Other (See Comments)    diarrhea   Penicillins Hives and Rash    Current Meds  Medication Sig   amLODipine  (NORVASC ) 2.5 MG tablet Take 1 tablet (2.5 mg total) by mouth daily.   aspirin  EC 81 MG tablet Take 1 tablet (81 mg total) by mouth daily. Swallow whole.   Calcium  Carbonate (CALCIUM  500 PO) Take 500 mg by mouth  daily.   Cholecalciferol (VITAMIN D3) 1000 units CAPS Take 1,000 Units by mouth daily.   clopidogrel  (PLAVIX ) 75 MG tablet Take 1 tablet (75 mg total) by mouth daily.   Cyanocobalamin  (B-12) 1000 MCG TABS Take 1,000 mcg by mouth daily.   Dulaglutide  (TRULICITY ) 3 MG/0.5ML SOPN INJECT 0.5 ML (3 MG) UNDER THE SKIN ONCE A WEEK   empagliflozin  (JARDIANCE ) 25 MG TABS tablet Take 1 tablet (25 mg total) by mouth daily before breakfast.   ezetimibe  (ZETIA ) 10 MG tablet TAKE 1 TABLET DAILY   famotidine  (PEPCID ) 20 MG tablet TAKE 1 TABLET TWICE A DAY   FLUoxetine  (PROZAC ) 40 MG capsule Take 1 capsule (40 mg total) by mouth daily.   fluticasone  (FLONASE ) 50 MCG/ACT nasal spray USE 2 SPRAYS IN EACH NOSTRIL DAILY AS NEEDED FOR ALLERGIES   gabapentin  (NEURONTIN ) 100 MG capsule Take 1 capsule (100 mg total) by mouth at bedtime.   isosorbide  mononitrate (IMDUR ) 30 MG 24 hr tablet Take 1 tablet (30 mg total) by mouth daily.   levothyroxine  (SYNTHROID ) 50 MCG tablet Take 1 tablet (50 mcg total) by mouth daily before breakfast.   metoprolol  succinate (TOPROL -XL) 25 MG 24 hr tablet Take 1 tablet (25 mg total) by mouth daily. Take with or immediately following a meal.   Multiple Vitamin (MULTIVITAMIN WITH MINERALS) TABS tablet Take 1 tablet by mouth daily.   nitroGLYCERIN  (NITROSTAT ) 0.4 MG SL tablet Place 1 tablet (0.4 mg total) under the tongue every 5 (five) minutes as needed for chest pain.   pantoprazole  (PROTONIX ) 20 MG tablet Take 1 tablet (20 mg total) by mouth daily.   repaglinide  (PRANDIN ) 1 MG tablet TAKE 1 TABLET DAILY BEFORE SUPPER   rOPINIRole  (REQUIP ) 1 MG tablet TAKE 1 TABLET AT BEDTIME   rosuvastatin  (CRESTOR ) 20 MG tablet Take 1 tablet (20 mg total) by mouth daily.    Objective: BP 114/68   Ht 5\' 2"  (1.575 m)   Wt 146 lb (66.2 kg)   LMP 03/20/1981   BMI 26.70 kg/m   Physical Exam:  General: Elderly female., Alert and oriented., and No acute distress. Gait: Slow, steady  gait.  Bilateral knees without deformity.  No redness.  No swelling.  Exquisite tenderness to palpation directly over the patella on the left.  No swelling in this area.  Knees are stable to varus and valgus stress.  Negative Lachman bilaterally.  IMAGING: I personally ordered and reviewed the following images  X-rays of bilateral knees were obtained and available in clinic today.  Neutral overall alignment.  No acute injuries.  Mild loss of joint space, primarily within the medial compartment bilaterally.  Minimal osteophytes.   New Medications:  No orders of the defined types were placed in this encounter.     Tonita Frater, MD  08/17/2023 12:15 PM

## 2023-08-28 DIAGNOSIS — H43392 Other vitreous opacities, left eye: Secondary | ICD-10-CM | POA: Diagnosis not present

## 2023-08-28 DIAGNOSIS — H43811 Vitreous degeneration, right eye: Secondary | ICD-10-CM | POA: Diagnosis not present

## 2023-08-28 DIAGNOSIS — H35352 Cystoid macular degeneration, left eye: Secondary | ICD-10-CM | POA: Diagnosis not present

## 2023-08-28 DIAGNOSIS — H02833 Dermatochalasis of right eye, unspecified eyelid: Secondary | ICD-10-CM | POA: Diagnosis not present

## 2023-08-28 DIAGNOSIS — H02836 Dermatochalasis of left eye, unspecified eyelid: Secondary | ICD-10-CM | POA: Diagnosis not present

## 2023-08-28 DIAGNOSIS — H35032 Hypertensive retinopathy, left eye: Secondary | ICD-10-CM | POA: Diagnosis not present

## 2023-08-28 DIAGNOSIS — H34832 Tributary (branch) retinal vein occlusion, left eye, with macular edema: Secondary | ICD-10-CM | POA: Diagnosis not present

## 2023-08-29 ENCOUNTER — Ambulatory Visit: Admitting: Family Medicine

## 2023-08-29 ENCOUNTER — Ambulatory Visit: Admitting: Orthopedic Surgery

## 2023-08-29 DIAGNOSIS — E1142 Type 2 diabetes mellitus with diabetic polyneuropathy: Secondary | ICD-10-CM

## 2023-08-29 DIAGNOSIS — R5382 Chronic fatigue, unspecified: Secondary | ICD-10-CM

## 2023-08-29 DIAGNOSIS — N1832 Chronic kidney disease, stage 3b: Secondary | ICD-10-CM

## 2023-08-29 DIAGNOSIS — F430 Acute stress reaction: Secondary | ICD-10-CM

## 2023-08-29 DIAGNOSIS — E1140 Type 2 diabetes mellitus with diabetic neuropathy, unspecified: Secondary | ICD-10-CM

## 2023-08-29 DIAGNOSIS — F411 Generalized anxiety disorder: Secondary | ICD-10-CM

## 2023-08-29 DIAGNOSIS — I10 Essential (primary) hypertension: Secondary | ICD-10-CM

## 2023-09-17 ENCOUNTER — Other Ambulatory Visit: Payer: Self-pay | Admitting: Family Medicine

## 2023-09-24 ENCOUNTER — Telehealth: Payer: Self-pay | Admitting: *Deleted

## 2023-09-24 ENCOUNTER — Encounter: Payer: Self-pay | Admitting: Family Medicine

## 2023-09-24 MED ORDER — GABAPENTIN 100 MG PO CAPS: 200.0000 mg | ORAL_CAPSULE | Freq: Every day | ORAL | 1 refills | Status: DC

## 2023-09-24 NOTE — Telephone Encounter (Signed)
 Go ahead and increase from 100 to 200 mg at bedtime Update me in a week    Pepcid  (H2 blocker) has less long term side effects than omeprazole  (ppi)   If pepcid  does not work then can return to ppi On med list she has pantoprazole  (protonix ) as her ppi   Let me know what she prefers and we can discuss further at next visit    I am currently out of office/out of town

## 2023-09-24 NOTE — Telephone Encounter (Signed)
 Pt sent a message saying:  Dr. Randeen, I think it might be beneficial to raise the dosage on the gabapentin .  It is helping but I still have problems with stinging legs and hands, especially at night.     When I was in the hospital several months back, for some reason, the pharmacist came to my room and talked to me about switching the omeprazole  to pepcid .  Could I go back on the omeprazole ?  I never knew why the pharmacist changed my meds, but the omeprazole  works much better (if it is being gaged by how I feel).   Thanks, Valerie Ball

## 2023-09-25 ENCOUNTER — Other Ambulatory Visit: Payer: Self-pay

## 2023-09-25 MED ORDER — METOPROLOL SUCCINATE ER 25 MG PO TB24: 25.0000 mg | ORAL_TABLET | Freq: Every day | ORAL | 2 refills | Status: DC

## 2023-09-25 NOTE — Telephone Encounter (Signed)
Sent mychart letting pt know Dr. Tower's comments. 

## 2023-09-29 ENCOUNTER — Encounter: Payer: Self-pay | Admitting: Internal Medicine

## 2023-09-29 DIAGNOSIS — E1140 Type 2 diabetes mellitus with diabetic neuropathy, unspecified: Secondary | ICD-10-CM

## 2023-10-01 MED ORDER — TRULICITY 3 MG/0.5ML ~~LOC~~ SOAJ: SUBCUTANEOUS | 3 refills | Status: AC

## 2023-10-01 NOTE — Addendum Note (Signed)
 Addended by: CLEOTILDE ROLIN RAMAN on: 10/01/2023 02:19 PM   Modules accepted: Orders

## 2023-10-02 ENCOUNTER — Other Ambulatory Visit: Payer: Self-pay | Admitting: Family Medicine

## 2023-10-02 MED ORDER — AMLODIPINE BESYLATE 2.5 MG PO TABS: 2.5000 mg | ORAL_TABLET | Freq: Every day | ORAL | 1 refills | Status: DC

## 2023-10-02 MED ORDER — GABAPENTIN 100 MG PO CAPS: 200.0000 mg | ORAL_CAPSULE | Freq: Every day | ORAL | 0 refills | Status: DC

## 2023-10-02 NOTE — Telephone Encounter (Signed)
 Copied from CRM [id number]. Topic: Clinical - Medication Refill >> Oct 02, 2023  9:29 AM Aleatha C wrote: Medication:  amLODipine  (NORVASC ) 2.5 MG tablet and gabapentin  (NEURONTIN ) 100 MG capsule 90 day supply   Has the patient contacted their pharmacy? Yes (Agent: If no, request that the patient contact the pharmacy for the refill. If patient does not wish to contact the pharmacy document the reason why and proceed with request.) (Agent: If yes, when and what did the pharmacy advise?)  This is the patient's preferred pharmacy:   [facility] - [place], [place] - [address] [place] [address] Phone: [phone] Fax: [phone]  Is this the correct pharmacy for this prescription? Yes If no, delete pharmacy and type the correct one.   Has the prescription been filled recently? No  Is the patient out of the medication? Yes  Has the patient been seen for an appointment in the last year OR does the patient have an upcoming appointment? Yes  Can we respond through MyChart? No  Agent: Please be advised that Rx refills may take up to 3 business days. We ask that you follow-up with your pharmacy.

## 2023-10-02 NOTE — Telephone Encounter (Signed)
 Per prev note, wants a 90 day Rx sent to mail order pharmacy

## 2023-10-05 ENCOUNTER — Other Ambulatory Visit: Payer: Self-pay | Admitting: Family Medicine

## 2023-10-13 ENCOUNTER — Other Ambulatory Visit: Payer: Self-pay | Admitting: Family Medicine

## 2023-10-18 DIAGNOSIS — H43392 Other vitreous opacities, left eye: Secondary | ICD-10-CM | POA: Diagnosis not present

## 2023-10-18 DIAGNOSIS — H02833 Dermatochalasis of right eye, unspecified eyelid: Secondary | ICD-10-CM | POA: Diagnosis not present

## 2023-10-18 DIAGNOSIS — H43811 Vitreous degeneration, right eye: Secondary | ICD-10-CM | POA: Diagnosis not present

## 2023-10-18 DIAGNOSIS — H02836 Dermatochalasis of left eye, unspecified eyelid: Secondary | ICD-10-CM | POA: Diagnosis not present

## 2023-10-18 DIAGNOSIS — H34832 Tributary (branch) retinal vein occlusion, left eye, with macular edema: Secondary | ICD-10-CM | POA: Diagnosis not present

## 2023-10-18 DIAGNOSIS — H35352 Cystoid macular degeneration, left eye: Secondary | ICD-10-CM | POA: Diagnosis not present

## 2023-10-18 DIAGNOSIS — H35032 Hypertensive retinopathy, left eye: Secondary | ICD-10-CM | POA: Diagnosis not present

## 2023-11-02 ENCOUNTER — Ambulatory Visit: Admitting: Internal Medicine

## 2023-11-02 ENCOUNTER — Ambulatory Visit: Payer: TRICARE For Life (TFL) | Admitting: Internal Medicine

## 2023-11-26 ENCOUNTER — Ambulatory Visit: Admitting: Internal Medicine

## 2023-12-06 DIAGNOSIS — H35032 Hypertensive retinopathy, left eye: Secondary | ICD-10-CM | POA: Diagnosis not present

## 2023-12-06 DIAGNOSIS — H43811 Vitreous degeneration, right eye: Secondary | ICD-10-CM | POA: Diagnosis not present

## 2023-12-06 DIAGNOSIS — H35352 Cystoid macular degeneration, left eye: Secondary | ICD-10-CM | POA: Diagnosis not present

## 2023-12-06 DIAGNOSIS — H34832 Tributary (branch) retinal vein occlusion, left eye, with macular edema: Secondary | ICD-10-CM | POA: Diagnosis not present

## 2023-12-06 DIAGNOSIS — H43392 Other vitreous opacities, left eye: Secondary | ICD-10-CM | POA: Diagnosis not present

## 2023-12-06 DIAGNOSIS — H02833 Dermatochalasis of right eye, unspecified eyelid: Secondary | ICD-10-CM | POA: Diagnosis not present

## 2023-12-06 DIAGNOSIS — H02836 Dermatochalasis of left eye, unspecified eyelid: Secondary | ICD-10-CM | POA: Diagnosis not present

## 2023-12-13 ENCOUNTER — Other Ambulatory Visit: Payer: Self-pay | Admitting: Family Medicine

## 2023-12-13 ENCOUNTER — Other Ambulatory Visit: Payer: Self-pay | Admitting: Internal Medicine

## 2023-12-14 DIAGNOSIS — E860 Dehydration: Secondary | ICD-10-CM | POA: Diagnosis not present

## 2023-12-18 ENCOUNTER — Telehealth: Payer: Self-pay | Admitting: *Deleted

## 2023-12-18 MED ORDER — LOSARTAN POTASSIUM 25 MG PO TABS: 25.0000 mg | ORAL_TABLET | Freq: Every day | ORAL | 1 refills | Status: DC

## 2023-12-18 NOTE — Telephone Encounter (Signed)
 Rx refill sent to pharmacy.

## 2024-01-07 ENCOUNTER — Ambulatory Visit (INDEPENDENT_AMBULATORY_CARE_PROVIDER_SITE_OTHER): Admitting: *Deleted

## 2024-01-07 DIAGNOSIS — Z23 Encounter for immunization: Secondary | ICD-10-CM

## 2024-01-09 ENCOUNTER — Other Ambulatory Visit: Payer: Self-pay

## 2024-01-09 DIAGNOSIS — M1990 Unspecified osteoarthritis, unspecified site: Secondary | ICD-10-CM | POA: Insufficient documentation

## 2024-01-09 DIAGNOSIS — G40909 Epilepsy, unspecified, not intractable, without status epilepticus: Secondary | ICD-10-CM | POA: Insufficient documentation

## 2024-01-10 ENCOUNTER — Ambulatory Visit

## 2024-01-10 VITALS — BP 112/62 | HR 90 | Ht 63.0 in | Wt 141.8 lb

## 2024-01-10 DIAGNOSIS — I2510 Atherosclerotic heart disease of native coronary artery without angina pectoris: Secondary | ICD-10-CM | POA: Diagnosis not present

## 2024-01-10 DIAGNOSIS — E782 Mixed hyperlipidemia: Secondary | ICD-10-CM | POA: Diagnosis not present

## 2024-01-10 DIAGNOSIS — I351 Nonrheumatic aortic (valve) insufficiency: Secondary | ICD-10-CM

## 2024-01-10 DIAGNOSIS — I080 Rheumatic disorders of both mitral and aortic valves: Secondary | ICD-10-CM | POA: Diagnosis not present

## 2024-01-10 DIAGNOSIS — I10 Essential (primary) hypertension: Secondary | ICD-10-CM | POA: Diagnosis not present

## 2024-01-10 HISTORY — DX: Nonrheumatic aortic (valve) insufficiency: I35.1

## 2024-01-10 NOTE — Progress Notes (Signed)
 Cardiology Consultation:    Date:  01/10/2024   ID:  Valerie Ball, DOB 12-Sep-1945, MRN [id number]  PCP:  Randeen Laine LABOR, MD  Cardiologist:  Alean SAUNDERS Karn Derk, MD   Referring MD: Randeen Laine LABOR, MD   No chief complaint on file.    ASSESSMENT AND PLAN:   Valerie Ball 78 year old woman with history of CAD cardiac CT November 2024 was abnormal, subsequently cardiac catheterization and PCI of mid LAD 03/08/2023, subsequently with chest pain was readmitted and redo cath on 03-27-2023 showed patent LAD stent and nonsignificant by RFR ostial RCA lesion and CFR of 1.8 suggested microvascular dysfunction with elevated LVEDP 25 mmHg ), normal biventricular function on echocardiogram January 2025, mild mitral stenosis [mean gradient 4 mmHg] past, mild aortic insufficiency.  Also has history hypertension, hyperlipidemia, hypothyroidism, irritable bowel syndrome, hiatal hernia s/p unsuccessful repair, seizure disorder, chronic thrombocytopenia, gastroparesis, NASH, chronic dizziness, CKD stage II, chronic microvascular ischemic changes on CT head September 2024, hemorrhoids, palpitations without significant abnormalities on event monitor October 2024.  Here for routine follow-up visit Problem List Items Addressed This Visit     Essential hypertension   Well-controlled. Target blood pressure below 130/80 mmHg. Continue current medications amlodipine , Imdur , metoprolol  and losartan .       Hyperlipidemia   Last lipid panel to review is from 05/04/2022.  Well-controlled. Continue  Crestor  20 mg once daily and Zetia  10 mg once daily. Repeat lipid panel tentatively prior to next follow-up visit in 6 months.       Mitral stenosis and aortic incompetence, by TTE 01/25/2023   Repeat evaluation with echocardiogram tentatively around March or April 2026 prior to next follow-up visit in 6 months.      CAD (coronary artery disease)  CT Cardiac 01/19/2023 calcium  score 411, plaque volume 366, moderate  stenosis LAD, mild stenosis D1, RCA, LCx/OM 3; S/p PCI of Mid LAD 03/08/2023;rpt cath 03/27/2023 stable - Primary   Good functional capacity. Tiredness and lack of motivation to do activities.  Continue with dual antiplatelet therapy for 12 months. Plavix  can be discontinued at end of December.  Continue lipid-lowering therapy as discussed under hyperlipidemia.  Continue antianginal regimen. Remains on amlodipine  2.5 mg once daily Imdur  30 mg once daily Metoprolol  XL 25 mg once daily.  Her sense of tiredness is likely noncardiac and more related to her mood.  Recommended to exercise regularly and go for a walk for exercise.  No overt signs of bleeding. Given her thrombocytopenia and ongoing dual antiplatelet therapy will repeat CBC to rule out any significant worsening of her thrombocytopenia or anemia.      Mild aortic regurgitation prior TTE November 2024   Repeat evaluation with echocardiogram tentatively around March/April 2026      Return to clinic tentatively in 6 months   History of Present Illness:    Valerie Ball is a 78 y.o. female who is being seen today for follow-up visit. PCP is Tower, Laine LABOR, MD. Last visit with me in the office was 07/11/2023. Follows with hematologist Dr. Ezzard.  Pleasant woman here  for the visit today by herself.  Lives with her husband at home.  H/o CAD (progressive symptoms of dyspnea evaluated with cardiac CT November 2024 was abnormal, subsequently cardiac catheterization and PCI of mid LAD 03/08/2023, subsequently with chest pain was readmitted and redo cath on 03-27-2023 showed patent LAD stent and nonsignificant by RFR ostial RCA lesion and CFR of 1.8 suggested microvascular dysfunction with elevated LVEDP 25 mmHg ),  normal biventricular function on echocardiogram January 2025, mild mitral stenosis [mean gradient 4 mmHg] past, mild aortic insufficiency. Also has history hypertension, hyperlipidemia, hypothyroidism, irritable bowel  syndrome, hiatal hernia s/p unsuccessful repair, seizure disorder, chronic thrombocytopenia, gastroparesis, NASH, chronic dizziness, CKD stage II, chronic microvascular ischemic changes on CT head September 2024, hemorrhoids, palpitations without significant abnormalities on event monitor October 2024.  At last office visit with relatively low blood pressures and symptoms of tiredness low carvedilol  was switched to metoprolol .  Blood work from 08/16/2023 notes stable electrolytes sodium 143, potassium 4 BUN 21, creatinine 1.05, eGFR 55. CBC with hemoglobin 12.3, hematocrit 37.5, WBC 4.2 and platelets 89.  Mentions overall she is able to do her day-to-day activities but reports a sensation of feeling tired.  Denies any chest pain or shortness of breath. No orthopnea paroxysmal nocturnal dyspnea.  No pedal edema. No blood in urine or stools. Occasional lightheadedness on sudden change in position, denies any syncopal or near syncopal episodes. No palpitations.  Consistently takes her medication including aspirin  and Plavix .   Past Medical History:  Diagnosis Date   Allergic rhinitis 08/22/2006   Qualifier: Diagnosis of   By: Cecilie CMA, NANNIE Ivy      IMO SNOMED Dx Update Oct 2024     Allergy    allegic rhinitis   Alternating constipation and diarrhea 08/12/2015   Anemia    Angiodysplasia of colon    Anxiety    Arthritis    AVM (arteriovenous malformation) of colon    Bilateral knee pain 05/11/2022   Blurred vision, right eye 08/11/2021   Patient does have blurred vision right eye,     Borderline diabetic    per patient medical history form   Branch retinal vein occlusion with macular edema of left eye (HCC) 07/14/2019   CME superior to the fovea OS has worsened at 7-week interval from macular branch retinal vein occlusion, repeat Avastin  today and evaluate exam in 6 weeks     Breast cancer (HCC) 02/2009   breast CA L invasive ductal CA(hormone receptor positive.)   CAD  (coronary artery disease)  CT Cardiac 01/19/2023 calcium  score 411, plaque volume 366, moderate stenosis LAD, mild stenosis D1, RCA, LCx/OM 3 02/01/2023   Chest pain 12/29/2022   Chronic idiopathic thrombocytopenia (HCC) 03/23/2023   Colon cancer screening 01/23/2014   Colon polyp    hyperplastic   Colon stricture (HCC) 01/14/2003   Cystoid macular edema of left eye 07/14/2019   Decreased calculated GFR 05/11/2022   Diabetic gastroparesis (HCC) 03/27/2016   Typical symptoms -suspect      No detectable diabetic retinopathy 12-27-2020     Diabetic neuropathy (HCC) 11/25/2013   Diaphragmatic hernia 03/02/2008   Qualifier: Diagnosis of   By: Genie CMA LEODIS), Leisha      IMO SNOMED Dx Update Oct 2024     Diverticulitis of colon 10/14/2014   Diverticulosis    Dizziness 01/07/2014   Dyspnea on exertion 01/04/2023   Encounter for Medicare annual wellness exam 01/23/2014   ESOPHAGEAL STRICTURE 03/02/2008   Qualifier: Diagnosis of   By: Genie CMA (AAMA), Chick         Essential hypertension 08/22/2006   Qualifier: Diagnosis of   By: Cecilie CMA, NANNIE Ivy         Estrogen deficiency 01/23/2014   Eye pain 03/23/2023   Fatigue 05/20/2018   Fatty liver    Fever, low grade 09/24/2020   GAD (generalized anxiety disorder) 04/15/2007   Qualifier: Diagnosis of  By: Baird FNP, Frieda Kipper        GI bleed    Gout    Hepatic steatosis    Hiatal hernia    Hip pain, bilateral 08/31/2017   History of breast cancer 08/02/2009   Qualifier: Diagnosis of   By: Randeen MD, Laine Caldron        History of CAD (coronary artery disease) 03/23/2023   History of COVID-19 09/24/2020   History of removal of implants of both breasts 07/01/2013   06/25/13 elected to have bilateral breast implants removed.     History of seizure 08/22/2006   Qualifier: Diagnosis of   By: Cecilie CMA, NANNIE Ivy         Hyperlipidemia    Hyperlipidemia associated with type 2 diabetes mellitus (HCC) 08/22/2006    Qualifier: Diagnosis of   By: Cecilie CMA, NANNIE Ivy         Hypertension    Hypertensive retinopathy of left eye 07/14/2019   Hypothyroidism    IBS (irritable bowel syndrome)    Lap Nissen October 2013 01/19/2012   Laparoscopic takedown of large type III mixed hiatus hernia with primary closure of the diaphragm with 3 sutures posteriorally (post 2 with pledgets) and Nissen fundoplication over a #56 lighted bougie        Large type III mixed hiatus hernia  12/06/2011   Plan surgery to repair diaphragm and do Nissen fundoplication     Macular edema    left eye   Melena 01/2008   anemia transfusion   Menopause    per patient medical history form   Mitral stenosis and aortic incompetence, by TTE 01/25/2023 02/01/2023   Non-insulin  treated type 2 diabetes mellitus (HCC) 07/25/2010   Ask patient to clarify. Type not noted on medical history form.     Palpitations 01/04/2023   Peritoneal adhesions 03/02/2008   Qualifier: Diagnosis of   By: Genie CMA LEODIS), Chick      IMO SNOMED Dx Update Oct 2024     Poor balance 01/30/2023   Poorly controlled type 2 diabetes mellitus with neuropathy (HCC) 08/19/2007   Qualifier: Diagnosis of   By: Randeen MD, Laine Caldron        Posterior vitreous detachment of right eye 11/06/2019   Postmenopausal hormone therapy    per patient medical history form.   Retinal vein occlusion, branch (HCC) 01/03/2016   Left with hemorrhage and edema (Dr Elner) 2017     Routine general medical examination at a health care facility 07/11/2011   Salmonella 05/2000   renal effects secondary to dehydration   Seizure disorder (HCC)    Seizures (HCC)    one seizure several yrs ago --etiology unknown-no problem since   Stress reaction 10/25/2011   SYNDROME, CARPAL TUNNEL 08/23/2006   Qualifier: Diagnosis of   By: Randeen MD, Laine Caldron        SYNDROME, RESTLESS LEGS 08/23/2006   Qualifier: Diagnosis of   By: Randeen MD, Laine Caldron        Thrombocytopenia 11/25/2013    TRANSAMINASES, SERUM, ELEVATED 11/11/2009   Qualifier: Diagnosis of   By: Randeen MD, Laine Caldron        Tremor 01/20/2014   Head and right hand      Vitamin B12 deficiency 09/21/2016   Vitreous floaters of left eye 07/14/2019   The nature of vitreous floaters was discussed with the patient as well as their frequent occurrence with development of posterior vitreous detachment. The significance of flashes and  field cuts was discussed with the patient. The need for a dilated fundus examination was discussed and advice given to return immediately for new or different floaters .  More uncommonly, vitreous floaters, debris, or    Past Surgical History:  Procedure Laterality Date   ABDOMINAL HYSTERECTOMY  1983   BREAST SURGERY  02/2009   breast biopsy invasive ductal CA-bilateral mastectomies   CORONARY PRESSURE/FFR STUDY N/A 03/27/2023   Procedure: CORONARY PRESSURE/FFR STUDY;  Surgeon: Wendel Lurena POUR, MD;  Location: [facility];  Service: Cardiovascular;  Laterality: N/A;   CORONARY STENT INTERVENTION N/A 03/08/2023   Procedure: CORONARY STENT INTERVENTION;  Surgeon: Verlin Lonni BIRCH, MD;  Location: [facility];  Service: Cardiovascular;  Laterality: N/A;   EPIGASTRIC HERNIA REPAIR  01/02/2012   Procedure: HERNIA REPAIR EPIGASTRIC ADULT;  Surgeon: Donnice KATHEE Lunger, MD;  Location: [facility];  Service: General;  Laterality: N/A;  Laparoscopic Repair Paraesophageal Hernia, Nissen   EYE SURGERY Left 07/2017   Dr. Elner GULA CYST EXCISION     LAPAROSCOPIC NISSEN FUNDOPLICATION  01/02/2012   Procedure: LAPAROSCOPIC NISSEN FUNDOPLICATION;  Surgeon: Donnice KATHEE Lunger, MD;  Location: [facility];  Service: General;  Laterality: N/A;   LEFT HEART CATH AND CORONARY ANGIOGRAPHY N/A 03/08/2023   Procedure: LEFT HEART CATH AND CORONARY ANGIOGRAPHY;  Surgeon: Verlin Lonni BIRCH, MD;  Location: [facility];  Service: Cardiovascular;  Laterality: N/A;   LEFT HEART CATH AND CORONARY  ANGIOGRAPHY N/A 03/27/2023   Procedure: LEFT HEART CATH AND CORONARY ANGIOGRAPHY;  Surgeon: Wendel Lurena POUR, MD;  Location: [facility];  Service: Cardiovascular;  Laterality: N/A;   MASTECTOMY  04/2009   Bilateral for ductal carcinoma on L   OVARY SURGERY  1986   ovarian tumor removed   TUBAL LIGATION      Current Medications: Current Meds  Medication Sig   amLODipine  (NORVASC ) 2.5 MG tablet Take 1 tablet (2.5 mg total) by mouth daily.   aspirin  EC 81 MG tablet Take 1 tablet (81 mg total) by mouth daily. Swallow whole.   clopidogrel  (PLAVIX ) 75 MG tablet Take 1 tablet (75 mg total) by mouth daily.   Dulaglutide  (TRULICITY ) 3 MG/0.5ML SOAJ INJECT 0.5 ML (3 MG) UNDER THE SKIN ONCE A WEEK   empagliflozin  (JARDIANCE ) 25 MG TABS tablet TAKE 1 TABLET DAILY BEFORE BREAKFAST   ezetimibe  (ZETIA ) 10 MG tablet TAKE 1 TABLET DAILY   famotidine  (PEPCID ) 20 MG tablet TAKE 1 TABLET TWICE A DAY   FLUoxetine  (PROZAC ) 40 MG capsule TAKE 1 CAPSULE DAILY   fluticasone  (FLONASE ) 50 MCG/ACT nasal spray USE 2 SPRAYS IN EACH NOSTRIL DAILY AS NEEDED FOR ALLERGIES   gabapentin  (NEURONTIN ) 100 MG capsule Take 2 capsules (200 mg total) by mouth at bedtime.   isosorbide  mononitrate (IMDUR ) 30 MG 24 hr tablet Take 1 tablet (30 mg total) by mouth daily.   levothyroxine  (SYNTHROID ) 50 MCG tablet Take 1 tablet (50 mcg total) by mouth daily before breakfast.   losartan  (COZAAR ) 25 MG tablet Take 1 tablet (25 mg total) by mouth daily.   metoprolol  succinate (TOPROL -XL) 25 MG 24 hr tablet Take 1 tablet (25 mg total) by mouth daily. Take with or immediately following a meal.   Multiple Vitamin (MULTIVITAMIN WITH MINERALS) TABS tablet Take 1 tablet by mouth daily.   nitroGLYCERIN  (NITROSTAT ) 0.4 MG SL tablet Place 1 tablet (0.4 mg total) under the tongue every 5 (five) minutes as needed for chest pain.   pantoprazole  (  PROTONIX ) 20 MG tablet Take 1 tablet (20 mg total) by mouth daily.   repaglinide  (PRANDIN ) 1 MG  tablet TAKE 1 TABLET DAILY BEFORE SUPPER   rOPINIRole  (REQUIP ) 1 MG tablet TAKE 1 TABLET AT BEDTIME   rosuvastatin  (CRESTOR ) 20 MG tablet Take 1 tablet (20 mg total) by mouth daily.     Allergies:   Arimidex [anastrozole], Glipizide , Metformin  and related, and Penicillins   Social History   Socioeconomic History   Marital status: Married    Spouse name: Not on file   Number of children: 3   Years of education: Not on file   Highest education level: Not on file  Occupational History   Occupation: retired    Associate Professor: UNEMPLOYED  Tobacco Use   Smoking status: Never   Smokeless tobacco: Never  Vaping Use   Vaping status: Never Used  Substance and Sexual Activity   Alcohol use: Yes    Comment: occasional   Drug use: Never   Sexual activity: Not Currently  Other Topics Concern   Not on file  Social History Narrative   ** Merged History Encounter **       1 cup of coffee every morning   Social Drivers of Health   Financial Resource Strain: Low Risk  (02/28/2023)   Overall Financial Resource Strain (CARDIA)    Difficulty of Paying Living Expenses: Not hard at all  Food Insecurity: No Food Insecurity (03/24/2023)   Hunger Vital Sign    Worried About Running Out of Food in the Last Year: Never true    Ran Out of Food in the Last Year: Never true  Transportation Needs: No Transportation Needs (03/24/2023)   PRAPARE - Administrator, Civil Service (Medical): No    Lack of Transportation (Non-Medical): No  Physical Activity: Inactive (02/28/2023)   Exercise Vital Sign    Days of Exercise per Week: 0 days    Minutes of Exercise per Session: 0 min  Stress: Stress Concern Present (02/28/2023)   [employer] of Occupational Health - Occupational Stress Questionnaire    Feeling of Stress : Rather much  Social Connections: Socially Integrated (03/24/2023)   Social Connection and Isolation Panel    Frequency of Communication with Friends and Family: Twice a week     Frequency of Social Gatherings with Friends and Family: Once a week    Attends Religious Services: More than 4 times per year    Active Member of [employer] or Organizations: Yes    Attends Engineer, structural: 1 to 4 times per year    Marital Status: Married     Family History: The patient's family history includes Breast cancer in her mother, sister, and sister; Diabetes in her brother, father, and sister; Esophageal cancer in her father; Heart attack in her father; Hypertension in her brother, mother, and sister; Stroke in her mother. ROS:   Please see the history of present illness.    All 14 point review of systems negative except as described per history of present illness.  EKGs/Labs/Other Studies Reviewed:    The following studies were reviewed today:   EKG:       Recent Labs: 03/24/2023: B Natriuretic Peptide 43.0 05/08/2023: TSH 1.36 08/16/2023: ALT 19; BUN 21; Creatinine 1.05; Hemoglobin 12.3; Platelet Count 89; Potassium 4.0; Sodium 143  Recent Lipid Panel    Component Value Date/Time   CHOL 112 05/04/2022 0909   TRIG 209.0 (H) 05/04/2022 0909   HDL 33.10 (L) 05/04/2022 9090  CHOLHDL 3 05/04/2022 0909   VLDL 41.8 (H) 05/04/2022 0909   LDLCALC 52 05/04/2021 0901   LDLDIRECT 55.0 05/04/2022 0909    Physical Exam:    VS:  BP 112/62   Pulse 90   Ht 5' 3 (1.6 m)   Wt 141 lb 12.8 oz (64.3 kg)   LMP 03/20/1981   SpO2 96%   BMI 25.12 kg/m     Wt Readings from Last 3 Encounters:  01/10/24 141 lb 12.8 oz (64.3 kg)  08/16/23 145 lb 6.4 oz (66 kg)  08/15/23 146 lb (66.2 kg)     GENERAL:  Well nourished, well developed in no acute distress NECK: No JVD; No carotid bruits CARDIAC: RRR, S1 and S2 present, no significant murmur audible. CHEST:  Clear to auscultation without rales, wheezing or rhonchi  Extremities: No pitting pedal edema. Pulses bilaterally symmetric with radial 2+ and dorsalis pedis 2+ NEUROLOGIC:  Alert and oriented x 3  Medication  Adjustments/Labs and Tests Ordered: Current medicines are reviewed at length with the patient today.  Concerns regarding medicines are outlined above.  No orders of the defined types were placed in this encounter.  No orders of the defined types were placed in this encounter.   Signed, Alean jess Kobus, MD, MPH, [facility]. 01/10/2024 1:36 PM    [facility]

## 2024-01-10 NOTE — Assessment & Plan Note (Signed)
 Last lipid panel to review is from 05/04/2022.  Well-controlled. Continue  Crestor  20 mg once daily and Zetia  10 mg once daily. Repeat lipid panel tentatively prior to next follow-up visit in 6 months.

## 2024-01-10 NOTE — Assessment & Plan Note (Signed)
 Repeat evaluation with echocardiogram tentatively around March or April 2026 prior to next follow-up visit in 6 months.

## 2024-01-10 NOTE — Addendum Note (Signed)
 Addended by: SHERRE ADE I on: 01/10/2024 01:50 PM   Modules accepted: Orders

## 2024-01-10 NOTE — Assessment & Plan Note (Addendum)
 Good functional capacity. Tiredness and lack of motivation to do activities.  Continue with dual antiplatelet therapy for 12 months. Plavix  can be discontinued at end of December.  Continue lipid-lowering therapy as discussed under hyperlipidemia.  Continue antianginal regimen. Remains on amlodipine  2.5 mg once daily Imdur  30 mg once daily Metoprolol  XL 25 mg once daily.  Her sense of tiredness is likely noncardiac and more related to her mood.  Recommended to exercise regularly and go for a walk for exercise.  No overt signs of bleeding. Given her thrombocytopenia and ongoing dual antiplatelet therapy will repeat CBC to rule out any significant worsening of her thrombocytopenia or anemia.

## 2024-01-10 NOTE — Assessment & Plan Note (Signed)
 Repeat evaluation with echocardiogram tentatively around March/April 2026

## 2024-01-10 NOTE — Assessment & Plan Note (Signed)
 Well-controlled. Target blood pressure below 130/80 mmHg. Continue current medications amlodipine , Imdur , metoprolol  and losartan .

## 2024-01-10 NOTE — Patient Instructions (Signed)
 Medication Instructions:  Your physician recommends that you continue on your current medications as directed. Please refer to the Current Medication list given to you today.  *If you need a refill on your cardiac medications before your next appointment, please call your pharmacy*  Lab Work: Your physician recommends that you return for lab work in:   Labs today: CMP, CBC Labs 1 week before next office visit: Lipids  If you have labs (blood work) drawn today and your tests are completely normal, you will receive your results only by: MyChart Message (if you have MyChart) OR A paper copy in the mail If you have any lab test that is abnormal or we need to change your treatment, we will call you to review the results.  Testing/Procedures: Your physician has requested that you have an echocardiogram. Echocardiography is a painless test that uses sound waves to create images of your heart. It provides your doctor with information about the size and shape of your heart and how well your heart's chambers and valves are working. This procedure takes approximately one hour. There are no restrictions for this procedure. Please do NOT wear cologne, perfume, aftershave, or lotions (deodorant is allowed). Please arrive 15 minutes prior to your appointment time.  Please note: We ask at that you not bring children with you during ultrasound (echo/ vascular) testing. Due to room size and safety concerns, children are not allowed in the ultrasound rooms during exams. Our front office staff cannot provide observation of children in our lobby area while testing is being conducted. An adult accompanying a patient to their appointment will only be allowed in the ultrasound room at the discretion of the ultrasound technician under special circumstances. We apologize for any inconvenience.   Follow-Up: At [facility], you and your health needs are our priority.  As part of our continuing mission to  provide you with exceptional heart care, our providers are all part of one team.  This team includes your primary Cardiologist (physician) and Advanced Practice Providers or APPs (Physician Assistants and Nurse Practitioners) who all work together to provide you with the care you need, when you need it.  Your next appointment:   6 month(s)  Provider:   Alean Kobus, MD    We recommend signing up for the patient portal called MyChart.  Sign up information is provided on this After Visit Summary.  MyChart is used to connect with patients for Virtual Visits (Telemedicine).  Patients are able to view lab/test results, encounter notes, upcoming appointments, etc.  Non-urgent messages can be sent to your provider as well.   To learn more about what you can do with MyChart, go to [email or web address].   Other Instructions None

## 2024-01-11 LAB — COMPREHENSIVE METABOLIC PANEL WITH GFR
ALT: 11 IU/L (ref 0–32)
AST: 21 IU/L (ref 0–40)
Albumin: 4.5 g/dL (ref 3.8–4.8)
Alkaline Phosphatase: 81 IU/L (ref 49–135)
BUN/Creatinine Ratio: 21 (ref 12–28)
BUN: 22 mg/dL (ref 8–27)
Bilirubin Total: 0.7 mg/dL (ref 0.0–1.2)
CO2: 20 mmol/L (ref 20–29)
Calcium: 9.9 mg/dL (ref 8.7–10.3)
Chloride: 101 mmol/L (ref 96–106)
Creatinine, Ser: 1.06 mg/dL — ABNORMAL HIGH (ref 0.57–1.00)
Globulin, Total: 2.9 g/dL (ref 1.5–4.5)
Glucose: 184 mg/dL — ABNORMAL HIGH (ref 70–99)
Potassium: 4.4 mmol/L (ref 3.5–5.2)
Sodium: 141 mmol/L (ref 134–144)
Total Protein: 7.4 g/dL (ref 6.0–8.5)
eGFR: 54 mL/min/1.73 — ABNORMAL LOW (ref >59)

## 2024-01-11 LAB — CBC
Hematocrit: 43 % (ref 34.0–46.6)
Hemoglobin: 14.1 g/dL (ref 11.1–15.9)
MCH: 29 pg (ref 26.6–33.0)
MCHC: 32.8 g/dL (ref 31.5–35.7)
MCV: 89 fL (ref 79–97)
Platelets: 134 x10E3/uL — ABNORMAL LOW (ref 150–450)
RBC: 4.86 x10E6/uL (ref 3.77–5.28)
RDW: 14.9 % (ref 11.7–15.4)
WBC: 5.6 x10E3/uL (ref 3.4–10.8)

## 2024-01-21 ENCOUNTER — Encounter: Payer: Self-pay | Admitting: Radiology

## 2024-01-24 DIAGNOSIS — H35032 Hypertensive retinopathy, left eye: Secondary | ICD-10-CM | POA: Diagnosis not present

## 2024-01-24 DIAGNOSIS — H34832 Tributary (branch) retinal vein occlusion, left eye, with macular edema: Secondary | ICD-10-CM | POA: Diagnosis not present

## 2024-01-24 DIAGNOSIS — H35352 Cystoid macular degeneration, left eye: Secondary | ICD-10-CM | POA: Diagnosis not present

## 2024-01-24 DIAGNOSIS — H43811 Vitreous degeneration, right eye: Secondary | ICD-10-CM | POA: Diagnosis not present

## 2024-01-24 DIAGNOSIS — H43392 Other vitreous opacities, left eye: Secondary | ICD-10-CM | POA: Diagnosis not present

## 2024-01-24 LAB — OPHTHALMOLOGY REPORT-SCANNED

## 2024-01-30 ENCOUNTER — Emergency Department (HOSPITAL_COMMUNITY)

## 2024-01-30 ENCOUNTER — Observation Stay (HOSPITAL_COMMUNITY)
Admission: EM | Admit: 2024-01-30 | Discharge: 2024-02-01 | Disposition: A | Discharge status: Home / Self Care | Attending: Internal Medicine | Admitting: Internal Medicine

## 2024-01-30 ENCOUNTER — Telehealth: Payer: Self-pay

## 2024-01-30 ENCOUNTER — Encounter (HOSPITAL_COMMUNITY): Payer: Self-pay

## 2024-01-30 DIAGNOSIS — F1090 Alcohol use, unspecified, uncomplicated: Secondary | ICD-10-CM | POA: Insufficient documentation

## 2024-01-30 DIAGNOSIS — E119 Type 2 diabetes mellitus without complications: Secondary | ICD-10-CM | POA: Diagnosis not present

## 2024-01-30 DIAGNOSIS — E785 Hyperlipidemia, unspecified: Secondary | ICD-10-CM | POA: Insufficient documentation

## 2024-01-30 DIAGNOSIS — E039 Hypothyroidism, unspecified: Secondary | ICD-10-CM | POA: Insufficient documentation

## 2024-01-30 DIAGNOSIS — Z794 Long term (current) use of insulin: Secondary | ICD-10-CM | POA: Insufficient documentation

## 2024-01-30 DIAGNOSIS — I10 Essential (primary) hypertension: Secondary | ICD-10-CM | POA: Insufficient documentation

## 2024-01-30 DIAGNOSIS — R011 Cardiac murmur, unspecified: Secondary | ICD-10-CM | POA: Insufficient documentation

## 2024-01-30 DIAGNOSIS — Z9013 Acquired absence of bilateral breasts and nipples: Secondary | ICD-10-CM | POA: Insufficient documentation

## 2024-01-30 DIAGNOSIS — Z79899 Other long term (current) drug therapy: Secondary | ICD-10-CM | POA: Diagnosis not present

## 2024-01-30 DIAGNOSIS — I2510 Atherosclerotic heart disease of native coronary artery without angina pectoris: Secondary | ICD-10-CM | POA: Insufficient documentation

## 2024-01-30 DIAGNOSIS — Z7982 Long term (current) use of aspirin: Secondary | ICD-10-CM | POA: Insufficient documentation

## 2024-01-30 DIAGNOSIS — J9811 Atelectasis: Secondary | ICD-10-CM | POA: Diagnosis not present

## 2024-01-30 DIAGNOSIS — R079 Chest pain, unspecified: Secondary | ICD-10-CM | POA: Diagnosis not present

## 2024-01-30 DIAGNOSIS — R0602 Shortness of breath: Secondary | ICD-10-CM | POA: Diagnosis not present

## 2024-01-30 DIAGNOSIS — R0789 Other chest pain: Secondary | ICD-10-CM | POA: Diagnosis not present

## 2024-01-30 LAB — CBC
HCT: 39.2 % (ref 36.0–46.0)
Hemoglobin: 12.8 g/dL (ref 12.0–15.0)
MCH: 28.8 pg (ref 26.0–34.0)
MCHC: 32.7 g/dL (ref 30.0–36.0)
MCV: 88.1 fL (ref 80.0–100.0)
Platelets: 122 K/uL — ABNORMAL LOW (ref 150–400)
RBC: 4.45 MIL/uL (ref 3.87–5.11)
RDW: 14.8 % (ref 11.5–15.5)
WBC: 5.9 K/uL (ref 4.0–10.5)
nRBC: 0 % (ref 0.0–0.2)

## 2024-01-30 LAB — TROPONIN I (HIGH SENSITIVITY)
Troponin I (High Sensitivity): 5 ng/L (ref <18)
Troponin I (High Sensitivity): 5 ng/L (ref <18)

## 2024-01-30 LAB — BASIC METABOLIC PANEL WITH GFR
Anion gap: 13 (ref 5–15)
BUN: 15 mg/dL (ref 8–23)
CO2: 23 mmol/L (ref 22–32)
Calcium: 9 mg/dL (ref 8.9–10.3)
Chloride: 103 mmol/L (ref 98–111)
Creatinine, Ser: 0.97 mg/dL (ref 0.44–1.00)
GFR, Estimated: >60 mL/min (ref >60)
Glucose, Bld: 113 mg/dL — ABNORMAL HIGH (ref 70–99)
Potassium: 3.8 mmol/L (ref 3.5–5.1)
Sodium: 139 mmol/L (ref 135–145)

## 2024-01-30 NOTE — ED Triage Notes (Signed)
 POV, Left side chest pain, aching that irradiated to the right side and neck, headache for the past three days.   Stent placed last year. Aox4, reports dyspnea with activity and dizziness.  Pain 4 out of 10.

## 2024-01-30 NOTE — ED Triage Notes (Signed)
 PT presents for CP. VSS

## 2024-01-30 NOTE — ED Provider Triage Note (Signed)
 Emergency Medicine Provider Triage Evaluation Note  Valerie Ball , a 78 y.o. female  was evaluated in triage.  Pt complains of chest pain, headache, and SOB with exertion that has been worsening over the last 2-3 days. This feels similarly to when she had stent placed last year d/t unstable angina. Chest pain began as left sided chest pain that is now radiating to her sternum and into her right neck. She has also began to experience dizziness with standing, and no episodes of syncope, falls, or head injury. Denies current nausea, or recent vomiting. She has been having intermittent nausea with eating for the last several months; unchanged in the last 2-3 days. Denies recent physical exertion or heavy lifting.  Hx of CAD, stent placement d/t unstable angina, T2DM. Taking Plavix  with no missed doses.  Review of Systems  Positive: Chest pain, SOB with exertion, headache. Dizziness. Negative: LOC. Recent illness or fever. Falls or head injury. Nausea or vomiting.  Physical Exam  BP 117/68   Pulse 76   Temp 97.9 F (36.6 C)   Resp 18   LMP 03/20/1981   SpO2 96%  Gen:   Awake, no distress. VSS.   Resp:  Normal effort. Lung sounds clear throughout.  MSK:   Moves extremities without difficulty.  Other:  Pulses normal with regular rhythm and rate. Skin is warm and dry with no pallor or diaphoresis. Mild epigastric pain with palpation at hiatal hernia; this is to baseline per patient. Otherwise unremarkable abdominal exam.  Medical Decision Making  Medically screening exam initiated at 5:32 PM.  Appropriate orders placed.  Valerie Ball was informed that the remainder of the evaluation will be completed by another provider, this initial triage assessment does not replace that evaluation, and the importance of remaining in the ED until their evaluation is complete.   Valerie Almarie LABOR, PA-C 01/30/24 1741

## 2024-01-30 NOTE — Telephone Encounter (Signed)
 Pt c/o of Chest Pain: STAT if active (IN THIS MOMENT) CP, including tightness, pressure, jaw pain, shoulder/upper arm/back pain, SOB, nausea, and vomiting.  1. Are you having CP right now (tightness, pressure, or discomfort)? Yes  2. Are you experiencing any other symptoms (ex. SOB, nausea, vomiting, sweating)? No just a headache   3. How long have you been experiencing CP? 4 days  4. Is your CP continuous or coming and going? Continuous   5. Have you taken Nitroglycerin ? No  6. If CP returns before callback, please consider calling 911. ?

## 2024-01-30 NOTE — Telephone Encounter (Signed)
 Received a call from the patient call center stating that this patient was having active chest pain. When the call was transferred to me the patient reported that she had been having chest pain in the left side of her chest for 2 - 3 days. Now the pain had moved to the center of her chest and started going up her neck. Based on the patient's report I recommended that she call 911 and go to the ER via EMS. Patient verbalized understanding and stated that she would go to the ER. Patient had no further questions at this time.

## 2024-01-31 ENCOUNTER — Other Ambulatory Visit: Payer: Self-pay

## 2024-01-31 ENCOUNTER — Observation Stay (HOSPITAL_COMMUNITY)

## 2024-01-31 DIAGNOSIS — R079 Chest pain, unspecified: Secondary | ICD-10-CM

## 2024-01-31 DIAGNOSIS — I25118 Atherosclerotic heart disease of native coronary artery with other forms of angina pectoris: Secondary | ICD-10-CM

## 2024-01-31 DIAGNOSIS — R072 Precordial pain: Secondary | ICD-10-CM | POA: Diagnosis not present

## 2024-01-31 DIAGNOSIS — I10 Essential (primary) hypertension: Secondary | ICD-10-CM | POA: Diagnosis not present

## 2024-01-31 DIAGNOSIS — R011 Cardiac murmur, unspecified: Secondary | ICD-10-CM

## 2024-01-31 DIAGNOSIS — E782 Mixed hyperlipidemia: Secondary | ICD-10-CM

## 2024-01-31 DIAGNOSIS — I2510 Atherosclerotic heart disease of native coronary artery without angina pectoris: Secondary | ICD-10-CM

## 2024-01-31 DIAGNOSIS — I2585 Chronic coronary microvascular dysfunction: Secondary | ICD-10-CM | POA: Diagnosis not present

## 2024-01-31 HISTORY — DX: Atherosclerotic heart disease of native coronary artery without angina pectoris: I25.10

## 2024-01-31 LAB — BASIC METABOLIC PANEL WITH GFR
Anion gap: 10 (ref 5–15)
BUN: 16 mg/dL (ref 8–23)
CO2: 24 mmol/L (ref 22–32)
Calcium: 9 mg/dL (ref 8.9–10.3)
Chloride: 106 mmol/L (ref 98–111)
Creatinine, Ser: 1.02 mg/dL — ABNORMAL HIGH (ref 0.44–1.00)
GFR, Estimated: 57 mL/min — ABNORMAL LOW (ref >60)
Glucose, Bld: 94 mg/dL (ref 70–99)
Potassium: 3.5 mmol/L (ref 3.5–5.1)
Sodium: 140 mmol/L (ref 135–145)

## 2024-01-31 LAB — GLUCOSE, CAPILLARY
Glucose-Capillary: 95 mg/dL (ref 70–99)
Glucose-Capillary: 175 mg/dL — ABNORMAL HIGH (ref 70–99)

## 2024-01-31 LAB — CBC
HCT: 37 % (ref 36.0–46.0)
Hemoglobin: 12.3 g/dL (ref 12.0–15.0)
MCH: 29.4 pg (ref 26.0–34.0)
MCHC: 33.2 g/dL (ref 30.0–36.0)
MCV: 88.5 fL (ref 80.0–100.0)
Platelets: 98 K/uL — ABNORMAL LOW (ref 150–400)
RBC: 4.18 MIL/uL (ref 3.87–5.11)
RDW: 15 % (ref 11.5–15.5)
WBC: 4.6 K/uL (ref 4.0–10.5)
nRBC: 0 % (ref 0.0–0.2)

## 2024-01-31 LAB — ECHOCARDIOGRAM COMPLETE
Area-P 1/2: 3.03 cm2
MV VTI: 1.24 cm2
S' Lateral: 2.6 cm

## 2024-01-31 LAB — PHOSPHORUS: Phosphorus: 4.4 mg/dL (ref 2.5–4.6)

## 2024-01-31 LAB — MAGNESIUM: Magnesium: 1.9 mg/dL (ref 1.7–2.4)

## 2024-01-31 MED ORDER — POLYETHYLENE GLYCOL 3350 17 G PO PACK: 17.0000 g | PACK | Freq: Every day | ORAL | Status: DC | PRN

## 2024-01-31 MED ORDER — CLOPIDOGREL BISULFATE 75 MG PO TABS: 75.0000 mg | ORAL_TABLET | Freq: Every day | ORAL | Status: DC

## 2024-01-31 MED ORDER — CLOPIDOGREL BISULFATE 75 MG PO TABS
75.0000 mg | ORAL_TABLET | Freq: Every day | ORAL | Status: DC
  Administered 2024-01-31: 75 mg via ORAL
  Filled 2024-01-31: qty 1

## 2024-01-31 MED ORDER — MELATONIN 5 MG PO TABS
5.0000 mg | ORAL_TABLET | Freq: Every evening | ORAL | Status: DC | PRN
Start: 2024-01-31 — End: 2024-02-01

## 2024-01-31 MED ORDER — AMLODIPINE BESYLATE 5 MG PO TABS: 5.0000 mg | ORAL_TABLET | Freq: Every day | ORAL | 1 refills | Status: DC

## 2024-01-31 MED ORDER — ROSUVASTATIN CALCIUM 20 MG PO TABS: 20.0000 mg | ORAL_TABLET | Freq: Every day | ORAL | Status: DC

## 2024-01-31 MED ORDER — EZETIMIBE 10 MG PO TABS: 10.0000 mg | ORAL_TABLET | Freq: Every day | ORAL | Status: DC

## 2024-01-31 MED ORDER — ACETAMINOPHEN 500 MG PO TABS
500.0000 mg | ORAL_TABLET | Freq: Four times a day (QID) | ORAL | Status: DC | PRN
  Administered 2024-01-31 – 2024-02-01 (×2): 500 mg via ORAL
  Filled 2024-01-31 (×2): qty 1

## 2024-01-31 MED ORDER — ISOSORBIDE MONONITRATE ER 30 MG PO TB24
30.0000 mg | ORAL_TABLET | Freq: Every day | ORAL | Status: DC
  Administered 2024-01-31: 30 mg via ORAL
  Filled 2024-01-31: qty 1

## 2024-01-31 MED ORDER — ASPIRIN 81 MG PO TBEC: 81.0000 mg | DELAYED_RELEASE_TABLET | Freq: Every day | ORAL | Status: DC

## 2024-01-31 MED ORDER — ENOXAPARIN SODIUM 40 MG/0.4ML IJ SOSY
40.0000 mg | PREFILLED_SYRINGE | Freq: Every day | INTRAMUSCULAR | Status: DC
  Administered 2024-01-31: 40 mg via SUBCUTANEOUS
  Filled 2024-01-31: qty 0.4

## 2024-01-31 MED ORDER — METOPROLOL SUCCINATE ER 25 MG PO TB24
12.5000 mg | ORAL_TABLET | Freq: Every day | ORAL | Status: DC
  Administered 2024-01-31: 12.5 mg via ORAL
  Filled 2024-01-31: qty 1

## 2024-01-31 MED ORDER — FAMOTIDINE 20 MG PO TABS
20.0000 mg | ORAL_TABLET | Freq: Two times a day (BID) | ORAL | Status: DC
  Administered 2024-01-31 (×3): 20 mg via ORAL
  Filled 2024-01-31 (×3): qty 1

## 2024-01-31 MED ORDER — ASPIRIN 81 MG PO CHEW
324.0000 mg | CHEWABLE_TABLET | Freq: Once | ORAL | Status: AC
  Administered 2024-01-31: 324 mg via ORAL
  Filled 2024-01-31: qty 4

## 2024-01-31 MED ORDER — METOPROLOL SUCCINATE ER 25 MG PO TB24: 12.5000 mg | ORAL_TABLET | Freq: Every day | ORAL | 1 refills | Status: AC

## 2024-01-31 MED ORDER — LEVOTHYROXINE SODIUM 50 MCG PO TABS
50.0000 ug | ORAL_TABLET | Freq: Every day | ORAL | Status: DC
  Administered 2024-01-31 – 2024-02-01 (×2): 50 ug via ORAL
  Filled 2024-01-31 (×2): qty 1

## 2024-01-31 MED ORDER — AMLODIPINE BESYLATE 5 MG PO TABS
5.0000 mg | ORAL_TABLET | Freq: Every day | ORAL | Status: DC
  Administered 2024-01-31: 5 mg via ORAL
  Filled 2024-01-31: qty 1

## 2024-01-31 MED ORDER — ROPINIROLE HCL 1 MG PO TABS
1.0000 mg | ORAL_TABLET | Freq: Once | ORAL | Status: AC
  Administered 2024-01-31: 1 mg via ORAL
  Filled 2024-01-31: qty 1

## 2024-01-31 MED ORDER — ROSUVASTATIN CALCIUM 20 MG PO TABS
20.0000 mg | ORAL_TABLET | Freq: Every day | ORAL | Status: DC
  Administered 2024-01-31: 20 mg via ORAL
  Filled 2024-01-31: qty 1

## 2024-01-31 MED ORDER — EZETIMIBE 10 MG PO TABS
10.0000 mg | ORAL_TABLET | Freq: Every day | ORAL | Status: DC
  Administered 2024-01-31: 10 mg via ORAL
  Filled 2024-01-31: qty 1

## 2024-01-31 MED ORDER — PROCHLORPERAZINE EDISYLATE 10 MG/2ML IJ SOLN: 5.0000 mg | Freq: Four times a day (QID) | INTRAMUSCULAR | Status: DC | PRN

## 2024-01-31 MED ORDER — LOSARTAN POTASSIUM 50 MG PO TABS
25.0000 mg | ORAL_TABLET | Freq: Every day | ORAL | Status: DC
  Administered 2024-01-31: 25 mg via ORAL
  Filled 2024-01-31: qty 1

## 2024-01-31 MED ORDER — ASPIRIN 81 MG PO TBEC
81.0000 mg | DELAYED_RELEASE_TABLET | Freq: Every day | ORAL | Status: DC
  Administered 2024-01-31: 81 mg via ORAL
  Filled 2024-01-31: qty 1

## 2024-01-31 NOTE — H&P (Signed)
 History and Physical  INDI WILLHITE FMW:[id number] DOB: [birth year] DOA: 01/30/2024  Referring physician: Dr. Lorette, EDP  PCP: Randeen Laine LABOR, MD  Outpatient Specialists: Cardiology. Patient coming from: Home.  Chief Complaint: Chest pain.  HPI: Valerie Ball is a 78 y.o. female with medical history significant for CAD status post PCI with LAD stent (03/08/23) on DAPT, hypertension, hyperlipidemia, hypothyroidism, seizure disorder, chronic thrombocytopenia, NASH, breast cancer status post bilateral mastectomies, who presents to the ER with complaints of chest pain for the past 4 to 5 days.  Initially, she had left-sided chest tightness and now it has migrated to the center of her chest.  It is tight, 6 out of 10 in severity, similar to prior chest pain that led to her heart cath in December 2024.  The pain radiates to her right neck.  Associated with dyspnea on exertion.  It became worse last night and she decided to come to the ER for further evaluation.  She has been compliant with her dual antiplatelets.  In the ER, no evidence of acute ischemia on twelve-lead EKG.  High-sensitivity troponin negative x 2.  Still has some chest discomfort, but improved, in the ER.  The patient received a full dose aspirin .  EDP discussed the case with cardiology who recommended admission by medicine team.  Cardiology will seen in consultation.  Admitted by [facility], hospitalist service.  ED Course: Temperature 98.1.  BP 114/54, pulse 64, respiratory 18, O2 saturation 98% on room air.  Review of Systems: Review of systems as noted in the HPI. All other systems reviewed and are negative.   Past Medical History:  Diagnosis Date   Allergic rhinitis 08/22/2006   Qualifier: Diagnosis of   By: Cecilie CMA, NANNIE Ivy      IMO SNOMED Dx Update Oct 2024     Allergy    allegic rhinitis   Alternating constipation and diarrhea 08/12/2015   Anemia    Angiodysplasia of colon    Anxiety    Arthritis    AVM  (arteriovenous malformation) of colon    Bilateral knee pain 05/11/2022   Blurred vision, right eye 08/11/2021   Patient does have blurred vision right eye,     Borderline diabetic    per patient medical history form   Branch retinal vein occlusion with macular edema of left eye (HCC) 07/14/2019   CME superior to the fovea OS has worsened at 7-week interval from macular branch retinal vein occlusion, repeat Avastin  today and evaluate exam in 6 weeks     Breast cancer (HCC) 02/2009   breast CA L invasive ductal CA(hormone receptor positive.)   CAD (coronary artery disease)  CT Cardiac 01/19/2023 calcium  score 411, plaque volume 366, moderate stenosis LAD, mild stenosis D1, RCA, LCx/OM 3 02/01/2023   Chest pain 12/29/2022   Chronic idiopathic thrombocytopenia (HCC) 03/23/2023   Colon cancer screening 01/23/2014   Colon polyp    hyperplastic   Colon stricture (HCC) 01/14/2003   Cystoid macular edema of left eye 07/14/2019   Decreased calculated GFR 05/11/2022   Diabetic gastroparesis (HCC) 03/27/2016   Typical symptoms -suspect      No detectable diabetic retinopathy 12-27-2020     Diabetic neuropathy (HCC) 11/25/2013   Diaphragmatic hernia 03/02/2008   Qualifier: Diagnosis of   By: Kowalk CMA LEODIS), Leisha      IMO SNOMED Dx Update Oct 2024     Diverticulitis of colon 10/14/2014   Diverticulosis    Dizziness 01/07/2014   Dyspnea on  exertion 01/04/2023   Encounter for Medicare annual wellness exam 01/23/2014   ESOPHAGEAL STRICTURE 03/02/2008   Qualifier: Diagnosis of   By: Genie CMA (AAMA), Chick         Essential hypertension 08/22/2006   Qualifier: Diagnosis of   By: Cecilie CMA, NANNIE Ivy         Estrogen deficiency 01/23/2014   Eye pain 03/23/2023   Fatigue 05/20/2018   Fatty liver    Fever, low grade 09/24/2020   GAD (generalized anxiety disorder) 04/15/2007   Qualifier: Diagnosis of   By: Baird FNP, Frieda Kipper        GI bleed    Gout    Hepatic steatosis     Hiatal hernia    Hip pain, bilateral 08/31/2017   History of breast cancer 08/02/2009   Qualifier: Diagnosis of   By: Randeen MD, Laine Caldron        History of CAD (coronary artery disease) 03/23/2023   History of COVID-19 09/24/2020   History of removal of implants of both breasts 07/01/2013   06/25/13 elected to have bilateral breast implants removed.     History of seizure 08/22/2006   Qualifier: Diagnosis of   By: Cecilie CMA, NANNIE Ivy         Hyperlipidemia    Hyperlipidemia associated with type 2 diabetes mellitus (HCC) 08/22/2006   Qualifier: Diagnosis of   By: Cecilie CMA, NANNIE Ivy         Hypertension    Hypertensive retinopathy of left eye 07/14/2019   Hypothyroidism    IBS (irritable bowel syndrome)    Lap Nissen October 2013 01/19/2012   Laparoscopic takedown of large type III mixed hiatus hernia with primary closure of the diaphragm with 3 sutures posteriorally (post 2 with pledgets) and Nissen fundoplication over a #56 lighted bougie        Large type III mixed hiatus hernia  12/06/2011   Plan surgery to repair diaphragm and do Nissen fundoplication     Macular edema    left eye   Melena 01/2008   anemia transfusion   Menopause    per patient medical history form   Mitral stenosis and aortic incompetence, by TTE 01/25/2023 02/01/2023   Non-insulin  treated type 2 diabetes mellitus (HCC) 07/25/2010   Ask patient to clarify. Type not noted on medical history form.     Palpitations 01/04/2023   Peritoneal adhesions 03/02/2008   Qualifier: Diagnosis of   By: Genie CMA LEODIS), Chick      IMO SNOMED Dx Update Oct 2024     Poor balance 01/30/2023   Poorly controlled type 2 diabetes mellitus with neuropathy (HCC) 08/19/2007   Qualifier: Diagnosis of   By: Randeen MD, Laine Caldron        Posterior vitreous detachment of right eye 11/06/2019   Postmenopausal hormone therapy    per patient medical history form.   Retinal vein occlusion, branch (HCC) 01/03/2016   Left with  hemorrhage and edema (Dr Elner) 2017     Routine general medical examination at a health care facility 07/11/2011   Salmonella 05/2000   renal effects secondary to dehydration   Seizure disorder (HCC)    Seizures (HCC)    one seizure several yrs ago --etiology unknown-no problem since   Stress reaction 10/25/2011   SYNDROME, CARPAL TUNNEL 08/23/2006   Qualifier: Diagnosis of   By: Randeen MD, Laine Caldron        SYNDROME, RESTLESS LEGS 08/23/2006   Qualifier: Diagnosis  of   By: Randeen MD, Laine Caldron        Thrombocytopenia 11/25/2013   TRANSAMINASES, SERUM, ELEVATED 11/11/2009   Qualifier: Diagnosis of   By: Randeen MD, Laine Caldron        Tremor 01/20/2014   Head and right hand      Vitamin B12 deficiency 09/21/2016   Vitreous floaters of left eye 07/14/2019   The nature of vitreous floaters was discussed with the patient as well as their frequent occurrence with development of posterior vitreous detachment. The significance of flashes and field cuts was discussed with the patient. The need for a dilated fundus examination was discussed and advice given to return immediately for new or different floaters .  More uncommonly, vitreous floaters, debris, or   Past Surgical History:  Procedure Laterality Date   ABDOMINAL HYSTERECTOMY  1983   BREAST SURGERY  02/2009   breast biopsy invasive ductal CA-bilateral mastectomies   CORONARY PRESSURE/FFR STUDY N/A 03/27/2023   Procedure: CORONARY PRESSURE/FFR STUDY;  Surgeon: Wendel Lurena POUR, MD;  Location: [facility];  Service: Cardiovascular;  Laterality: N/A;   CORONARY STENT INTERVENTION N/A 03/08/2023   Procedure: CORONARY STENT INTERVENTION;  Surgeon: Verlin Lonni BIRCH, MD;  Location: [facility];  Service: Cardiovascular;  Laterality: N/A;   EPIGASTRIC HERNIA REPAIR  01/02/2012   Procedure: HERNIA REPAIR EPIGASTRIC ADULT;  Surgeon: Donnice KATHEE Lunger, MD;  Location: [facility];  Service: General;  Laterality: N/A;  Laparoscopic Repair  Paraesophageal Hernia, Nissen   EYE SURGERY Left 07/2017   Dr. Elner GULA CYST EXCISION     LAPAROSCOPIC NISSEN FUNDOPLICATION  01/02/2012   Procedure: LAPAROSCOPIC NISSEN FUNDOPLICATION;  Surgeon: Donnice KATHEE Lunger, MD;  Location: [facility];  Service: General;  Laterality: N/A;   LEFT HEART CATH AND CORONARY ANGIOGRAPHY N/A 03/08/2023   Procedure: LEFT HEART CATH AND CORONARY ANGIOGRAPHY;  Surgeon: Verlin Lonni BIRCH, MD;  Location: [facility];  Service: Cardiovascular;  Laterality: N/A;   LEFT HEART CATH AND CORONARY ANGIOGRAPHY N/A 03/27/2023   Procedure: LEFT HEART CATH AND CORONARY ANGIOGRAPHY;  Surgeon: Wendel Lurena POUR, MD;  Location: [facility];  Service: Cardiovascular;  Laterality: N/A;   MASTECTOMY  04/2009   Bilateral for ductal carcinoma on L   OVARY SURGERY  1986   ovarian tumor removed   TUBAL LIGATION      Social History:  reports that she has never smoked. She has never used smokeless tobacco. She reports current alcohol use. She reports that she does not use drugs.   Allergies  Allergen Reactions   Arimidex [Anastrozole] Other (See Comments)    Joint pain   Glipizide  Other (See Comments)    Hypoglycemia    Metformin  And Related Other (See Comments)    diarrhea   Penicillins Hives and Rash    Family History  Problem Relation Age of Onset   Hypertension Mother    Stroke Mother    Breast cancer Mother    Heart attack Father    Diabetes Father    Esophageal cancer Father    Breast cancer Sister        x 2 sisters   Diabetes Sister    Hypertension Sister    Breast cancer Sister    Hypertension Brother    Diabetes Brother       Prior to Admission medications   Medication Sig Start Date End Date Taking? Authorizing Provider  amLODipine  (NORVASC ) 2.5 MG tablet Take 1  tablet (2.5 mg total) by mouth daily. 10/02/23   Tower, Laine LABOR, MD  aspirin  EC 81 MG tablet Take 1 tablet (81 mg total) by mouth daily. Swallow whole. 02/05/23    Madireddy, Alean SAUNDERS, MD  clopidogrel  (PLAVIX ) 75 MG tablet Take 1 tablet (75 mg total) by mouth daily. 05/14/23   Madireddy, Alean SAUNDERS, MD  Dulaglutide  (TRULICITY ) 3 MG/0.5ML SOAJ INJECT 0.5 ML (3 MG) UNDER THE SKIN ONCE A WEEK 10/01/23   Trixie File, MD  empagliflozin  (JARDIANCE ) 25 MG TABS tablet TAKE 1 TABLET DAILY BEFORE BREAKFAST 12/14/23   Trixie File, MD  ezetimibe  (ZETIA ) 10 MG tablet TAKE 1 TABLET DAILY 08/03/23   Tower, Laine LABOR, MD  famotidine  (PEPCID ) 20 MG tablet TAKE 1 TABLET TWICE A DAY 12/14/23   Tower, Laine LABOR, MD  FLUoxetine  (PROZAC ) 40 MG capsule TAKE 1 CAPSULE DAILY 12/14/23   Tower, Laine LABOR, MD  fluticasone  (FLONASE ) 50 MCG/ACT nasal spray USE 2 SPRAYS IN EACH NOSTRIL DAILY AS NEEDED FOR ALLERGIES 03/10/21   Tower, Laine LABOR, MD  gabapentin  (NEURONTIN ) 100 MG capsule Take 2 capsules (200 mg total) by mouth at bedtime. 10/02/23   Tower, Laine LABOR, MD  isosorbide  mononitrate (IMDUR ) 30 MG 24 hr tablet Take 1 tablet (30 mg total) by mouth daily. 05/14/23   Madireddy, Alean SAUNDERS, MD  levothyroxine  (SYNTHROID ) 50 MCG tablet Take 1 tablet (50 mcg total) by mouth daily before breakfast. 05/09/23   Tower, Laine LABOR, MD  losartan  (COZAAR ) 25 MG tablet Take 1 tablet (25 mg total) by mouth daily. 12/18/23 03/17/24  Madireddy, Alean SAUNDERS, MD  metoprolol  succinate (TOPROL -XL) 25 MG 24 hr tablet Take 1 tablet (25 mg total) by mouth daily. Take with or immediately following a meal. 09/25/23 06/21/24  Madireddy, Alean SAUNDERS, MD  Multiple Vitamin (MULTIVITAMIN WITH MINERALS) TABS tablet Take 1 tablet by mouth daily.    [provider]  nitroGLYCERIN  (NITROSTAT ) 0.4 MG SL tablet Place 1 tablet (0.4 mg total) under the tongue every 5 (five) minutes as needed for chest pain. 03/08/23   Trudy Birmingham, PA-C  pantoprazole  (PROTONIX ) 20 MG tablet Take 1 tablet (20 mg total) by mouth daily. 03/08/23   Williams, Evan, PA-C  repaglinide  (PRANDIN ) 1 MG tablet TAKE 1 TABLET DAILY BEFORE SUPPER 12/14/23    Trixie File, MD  rOPINIRole  (REQUIP ) 1 MG tablet TAKE 1 TABLET AT BEDTIME 08/03/23   Tower, Laine LABOR, MD  rosuvastatin  (CRESTOR ) 20 MG tablet Take 1 tablet (20 mg total) by mouth daily. 05/14/23 05/13/24  MadireddyAlean SAUNDERS, MD    Physical Exam: BP 127/73   Pulse 67   Temp 98.1 F (36.7 C)   Resp 16   LMP 03/20/1981   SpO2 97%   General: 78 y.o. year-old female well developed well nourished in no acute distress.  Alert and oriented x3. Cardiovascular: Regular rate and rhythm with no rubs or gallops.  No thyromegaly or JVD noted.  No lower extremity edema. 2/4 pulses in all 4 extremities. Respiratory: Clear to auscultation with no wheezes or rales. Good inspiratory effort. Abdomen: Soft nontender nondistended with normal bowel sounds x4 quadrants. Muskuloskeletal: No cyanosis, clubbing or edema noted bilaterally Neuro: CN II-XII intact, strength, sensation, reflexes Skin: No ulcerative lesions noted or rashes Psychiatry: Judgement and insight appear normal. Mood is appropriate for condition and setting          Labs on Admission:  Basic Metabolic Panel: Recent Labs  Lab 01/30/24 1738  NA 139  K 3.8  CL 103  CO2 23  GLUCOSE 113*  BUN 15  CREATININE 0.97  CALCIUM  9.0   Liver Function Tests: No results for input(s): AST, ALT, ALKPHOS, BILITOT, PROT, ALBUMIN in the last 168 hours. No results for input(s): LIPASE, AMYLASE in the last 168 hours. No results for input(s): AMMONIA in the last 168 hours. CBC: Recent Labs  Lab 01/30/24 1738  WBC 5.9  HGB 12.8  HCT 39.2  MCV 88.1  PLT 122*   Cardiac Enzymes: No results for input(s): CKTOTAL, CKMB, CKMBINDEX, TROPONINI in the last 168 hours.  BNP (last 3 results) Recent Labs    03/24/23 0034  BNP 43.0    ProBNP (last 3 results) No results for input(s): PROBNP in the last 8760 hours.  CBG: No results for input(s): GLUCAP in the last 168 hours.  Radiological Exams on  Admission: DG Chest 2 View Result Date: 01/30/2024 CLINICAL DATA:  Chest pain and shortness of breath. EXAM: DG CHEST 2V COMPARISON:  Chest radiograph dated 04/01/2023. FINDINGS: Minimal left lung base atelectasis. No focal consolidation, pleural effusion or pneumothorax. The cardiac silhouette is within normal limits. No acute osseous pathology. IMPRESSION: No active cardiopulmonary disease. Electronically Signed   By: Vanetta Chou M.D.   On: 01/30/2024 18:14    EKG: I independently viewed the EKG done and my findings are as followed: Normal sinus rhythm rate of 73.  QTc 500.  Assessment/Plan Present on Admission:  Chest pain  Principal Problem:   Chest pain  Chest pain, rule out ACS. No evidence of acute ischemia on 12-lead EKG High-sensitivity troponin negative x 2 Follow transthoracic echocardiogram Continue DAPT and home beta-blocker Cardiology will see in consultation Continue to monitor on telemetry Rest of management per cardiology  Coronary artery disease status post PCI in December 2024 Continue DAPT Continue Zetia  and Crestor  Rest of management per cardiology  Hypothyroidism Resume home levothyroxine .  Hyperlipidemia Resume home Crestor  and Zetia   History of breast cancer status post bilateral mastectomies No acute issues reported   Time: 75 minutes.   DVT prophylaxis: Subcu Lovenox daily  Code Status: Full code.  Family Communication: None at bedside.  Disposition Plan: Admitted to telemetry unit.  Consults called: Cardiology.  Admission status: Observation status.   Status is: Observation    Terry LOISE Hurst MD [facility] Pager [phone]  If 7PM-7AM, please contact night-coverage [email or web address] Password [email or web address]  01/31/2024, 1:58 AM

## 2024-01-31 NOTE — ED Notes (Signed)
 ED Provider at bedside.

## 2024-01-31 NOTE — Discharge Summary (Signed)
 Physician Discharge Summary   Patient: Valerie Ball MRN: [id number] DOB: 08-May-1945  Admit date:     01/30/2024  Discharge date: 01/31/24  Discharge Physician: Amaryllis Dare   PCP: Randeen Laine LABOR, MD   Recommendations at discharge:  Please obtain CBC and BMP on follow-up Follow-up with cardiology Follow-up with primary care provider  Discharge Diagnoses: Principal Problem:   Chest pain Active Problems:   Heart murmur   Hospital Course:   Valerie Ball is a 78 y.o. female with medical history significant for CAD status post PCI with LAD stent (03/08/23) on DAPT, hypertension, hyperlipidemia, hypothyroidism, seizure disorder, chronic thrombocytopenia, NASH, breast cancer status post bilateral mastectomies, who presents to the ER with complaints of chest pain for the past 4 to 5 days.   On presentation hemodynamically stable, EKG without any evidence of acute ischemia.  High sensitive troponin negative x 2.  As patient continued to have some chest discomfort, cardiology was consulted and they recommended admission under observation.  11/13: Vital stable, labs stable with platelets of 98, seems like having chronic thrombocytopenia. Echocardiogram with normal EF, grade 2 diastolic dysfunction and no regional wall motion abnormalities. ACS ruled out.  Cardiology cleared her for discharge.  They will follow-up as outpatient and if her symptoms persist they will do outpatient cardiac CT.  Home amlodipine  dose was increased from 2.5 mg to 5 mg daily and metoprolol  dose was decreased from 25 to 12.5 mg daily.  Patient will continue the rest of her home medications and follow-up with her providers for further assistance.  Consultants: Cardiology Procedures performed: None Disposition: Home Diet recommendation:  Discharge Diet Orders (From admission, onward)     Start     Ordered   01/31/24 0000  Diet - low sodium heart healthy        01/31/24 1800           Cardiac and  Carb modified diet DISCHARGE MEDICATION: Allergies as of 01/31/2024       Reactions   Arimidex [anastrozole] Other (See Comments)   Joint pain   Glipizide  Other (See Comments)   Hypoglycemia   Metformin  And Related Other (See Comments)   diarrhea   Penicillins Hives, Rash        Medication List     STOP taking these medications    pantoprazole  20 MG tablet Commonly known as: Protonix        TAKE these medications    amLODipine  5 MG tablet Commonly known as: NORVASC  Take 1 tablet (5 mg total) by mouth daily. Start taking on: February 01, 2024 What changed:  medication strength how much to take   aspirin  EC 81 MG tablet Take 1 tablet (81 mg total) by mouth daily. Swallow whole. What changed: when to take this   clopidogrel  75 MG tablet Commonly known as: Plavix  Take 1 tablet (75 mg total) by mouth daily. What changed: when to take this   ezetimibe  10 MG tablet Commonly known as: ZETIA  TAKE 1 TABLET DAILY What changed: when to take this   famotidine  20 MG tablet Commonly known as: PEPCID  TAKE 1 TABLET TWICE A DAY   FLUoxetine  40 MG capsule Commonly known as: PROZAC  TAKE 1 CAPSULE DAILY What changed: when to take this   fluticasone  50 MCG/ACT nasal spray Commonly known as: FLONASE  USE 2 SPRAYS IN EACH NOSTRIL DAILY AS NEEDED FOR ALLERGIES What changed: See the new instructions.   gabapentin  100 MG capsule Commonly known as: NEURONTIN  Take 2 capsules (200 mg  total) by mouth at bedtime.   isosorbide  mononitrate 30 MG 24 hr tablet Commonly known as: IMDUR  Take 1 tablet (30 mg total) by mouth daily. What changed: when to take this   Jardiance  25 MG Tabs tablet Generic drug: empagliflozin  TAKE 1 TABLET DAILY BEFORE BREAKFAST   levothyroxine  50 MCG tablet Commonly known as: SYNTHROID  Take 1 tablet (50 mcg total) by mouth daily before breakfast.   losartan  25 MG tablet Commonly known as: COZAAR  Take 1 tablet (25 mg total) by mouth daily. What  changed: when to take this   metoprolol  succinate 25 MG 24 hr tablet Commonly known as: TOPROL -XL Take 0.5 tablets (12.5 mg total) by mouth daily. Start taking on: February 01, 2024 What changed:  how much to take additional instructions   multivitamin with minerals Tabs tablet Take 1 tablet by mouth at bedtime.   nitroGLYCERIN  0.4 MG SL tablet Commonly known as: NITROSTAT  Place 1 tablet (0.4 mg total) under the tongue every 5 (five) minutes as needed for chest pain.   repaglinide  1 MG tablet Commonly known as: PRANDIN  TAKE 1 TABLET DAILY BEFORE SUPPER What changed: See the new instructions.   rOPINIRole  1 MG tablet Commonly known as: REQUIP  TAKE 1 TABLET AT BEDTIME   rosuvastatin  20 MG tablet Commonly known as: Crestor  Take 1 tablet (20 mg total) by mouth daily. What changed: when to take this   Trulicity  3 MG/0.5ML Soaj Generic drug: Dulaglutide  INJECT 0.5 ML (3 MG) UNDER THE SKIN ONCE A WEEK        Follow-up Information     Tower, Laine LABOR, MD. Schedule an appointment as soon as possible for a visit.   Specialties: Family Medicine, Radiology Contact information: [address] [place] [address] [phone]                Discharge Exam: There were no vitals filed for this visit. General.  Well-developed elderly lady, in no acute distress. Pulmonary.  Lungs clear bilaterally, normal respiratory effort. CV.  Regular rate and rhythm,  Abdomen.  Soft, nontender, nondistended, BS positive. CNS.  Alert and oriented .  No focal neurologic deficit. Extremities.  No edema, no cyanosis, pulses intact and symmetrical. Psychiatry.  Judgment and insight appears normal.   Condition at discharge: stable  The results of significant diagnostics from this hospitalization (including imaging, microbiology, ancillary and laboratory) are listed below for reference.   Imaging Studies: ECHOCARDIOGRAM COMPLETE Result Date: 01/31/2024    ECHOCARDIOGRAM  REPORT   Patient Name:   Valerie Ball Date of Exam: 01/31/2024 Medical Rec #:  [id number]        Height:       63.0 in Accession #:    [id number]       Weight:       141.8 lb Date of Birth:  Dec 16, 1945         BSA:          1.671 m Patient Age:    78 years         BP:           148/71 mmHg Patient Gender: F                HR:           68 bpm. Exam Location:  Inpatient Procedure: 2D Echo, Cardiac Doppler and Color Doppler (Both Spectral and Color            Flow Doppler were utilized during procedure). Indications:  Chest Pain  History:        Patient has prior history of Echocardiogram examinations, most                 recent 01/25/2023. CAD, Signs/Symptoms:Chest Pain and                 Dizziness/Lightheadedness; Risk Factors:Diabetes, Dyslipidemia                 and Hypertension.  Sonographer:    Juliene Rucks Referring Phys: [id number] TERRY LOISE HURST  Sonographer Comments: Image acquisition challenging due to mastectomy. IMPRESSIONS  1. Left ventricular ejection fraction, by estimation, is 60 to 65%. The left ventricle has normal function. The left ventricle has no regional wall motion abnormalities. There is mild concentric left ventricular hypertrophy. Left ventricular diastolic parameters are consistent with Grade II diastolic dysfunction (pseudonormalization). Elevated left atrial pressure.  2. Right ventricular systolic function is normal. The right ventricular size is normal.  3. The mitral valve is normal in structure. Mild mitral valve regurgitation. No evidence of mitral stenosis.  4. The aortic valve is normal in structure. Aortic valve regurgitation is not visualized. No aortic stenosis is present.  5. The inferior vena cava is normal in size with greater than 50% respiratory variability, suggesting right atrial pressure of 3 mmHg. FINDINGS  Left Ventricle: Left ventricular ejection fraction, by estimation, is 60 to 65%. The left ventricle has normal function. The left ventricle has no regional wall  motion abnormalities. The left ventricular internal cavity size was normal in size. There is  mild concentric left ventricular hypertrophy. Left ventricular diastolic parameters are consistent with Grade II diastolic dysfunction (pseudonormalization). Elevated left atrial pressure. Right Ventricle: The right ventricular size is normal. No increase in right ventricular wall thickness. Right ventricular systolic function is normal. Left Atrium: Left atrial size was normal in size. Right Atrium: Right atrial size was normal in size. Pericardium: There is no evidence of pericardial effusion. Presence of epicardial fat layer. Mitral Valve: The mitral valve is normal in structure. Mild to moderate mitral annular calcification. Mild mitral valve regurgitation. No evidence of mitral valve stenosis. MV peak gradient, 8.1 mmHg. The mean mitral valve gradient is 3.0 mmHg. Tricuspid Valve: The tricuspid valve is normal in structure. Tricuspid valve regurgitation is not demonstrated. No evidence of tricuspid stenosis. Aortic Valve: The aortic valve is normal in structure. Aortic valve regurgitation is not visualized. No aortic stenosis is present. Pulmonic Valve: The pulmonic valve was normal in structure. Pulmonic valve regurgitation is not visualized. No evidence of pulmonic stenosis. Aorta: The aortic root is normal in size and structure. Venous: The inferior vena cava is normal in size with greater than 50% respiratory variability, suggesting right atrial pressure of 3 mmHg. IAS/Shunts: No atrial level shunt detected by color flow Doppler.  LEFT VENTRICLE PLAX 2D LVIDd:         4.00 cm   Diastology LVIDs:         2.60 cm   LV e' medial:    4.57 cm/s LV PW:         1.10 cm   LV E/e' medial:  25.6 LV IVS:        1.20 cm   LV e' lateral:   5.98 cm/s LVOT diam:     1.80 cm   LV E/e' lateral: 19.6 LV SV:         49 LV SV Index:   29 LVOT Area:     2.54  cm  RIGHT VENTRICLE          IVC RV Basal diam:  2.30 cm  IVC diam: 1.00 cm  RV Mid diam:    2.10 cm LEFT ATRIUM           Index        RIGHT ATRIUM           Index LA diam:      3.30 cm 1.98 cm/m   RA Area:     11.90 cm LA Vol (A2C): 28.9 ml 17.30 ml/m  RA Volume:   20.60 ml  12.33 ml/m LA Vol (A4C): 45.0 ml 26.93 ml/m  AORTIC VALVE LVOT Vmax:   86.20 cm/s LVOT Vmean:  56.900 cm/s LVOT VTI:    0.192 m  AORTA Ao Root diam: 3.20 cm Ao Asc diam:  3.00 cm MITRAL VALVE                TRICUSPID VALVE MV Area (PHT): 3.03 cm     TR Peak grad:   11.3 mmHg MV Area VTI:   1.24 cm     TR Vmax:        168.00 cm/s MV Peak grad:  8.1 mmHg MV Mean grad:  3.0 mmHg     SHUNTS MV Vmax:       1.42 m/s     Systemic VTI:  0.19 m MV Vmean:      80.7 cm/s    Systemic Diam: 1.80 cm MV Decel Time: 250 msec MV E velocity: 117.00 cm/s MV A velocity: 147.00 cm/s MV E/A ratio:  0.80 Kardie Tobb DO Electronically signed by Dub Huntsman DO Signature Date/Time: 01/31/2024/4:26:05 PM    Final    DG Chest 2 View Result Date: 01/30/2024 CLINICAL DATA:  Chest pain and shortness of breath. EXAM: DG CHEST 2V COMPARISON:  Chest radiograph dated 04/01/2023. FINDINGS: Minimal left lung base atelectasis. No focal consolidation, pleural effusion or pneumothorax. The cardiac silhouette is within normal limits. No acute osseous pathology. IMPRESSION: No active cardiopulmonary disease. Electronically Signed   By: Vanetta Chou M.D.   On: 01/30/2024 18:14    Microbiology: Results for orders placed or performed in visit on 03/08/20  SARS-COV-2, NAA 2 DAY TAT     Status: None   Collection Time: 03/08/20 12:00 AM   Nasopharynge  Result Value Ref Range Status   SARS-CoV-2, NAA 2 DAY TAT Performed  Final  Novel Coronavirus, NAA ([facility])     Status: Abnormal   Collection Time: 03/08/20 12:00 AM   Specimen: Nasopharyngeal(NP) swabs in vial transport medium   Nasopharynge  Result Value Ref Range Status   SARS-CoV-2, NAA Detected (A) Not Detected Final    Comment: Patients who have a positive COVID-19 test result may  now have treatment options. Treatment options are available for patients with mild to moderate symptoms and for hospitalized patients. Visit our website at [email or web address] for resources and information. This nucleic acid amplification test was developed and its performance characteristics determined by [employer]. Nucleic acid amplification tests include RT-PCR and TMA. This test has not been FDA cleared or approved. This test has been authorized by FDA under an Emergency Use Authorization (EUA). This test is only authorized for the duration of time the declaration that circumstances exist justifying the authorization of the emergency use of in vitro diagnostic tests for detection of SARS-CoV-2 virus and/or diagnosis of COVID-19 infection under section 564(b)(1) of the Act, 21 U.S.C. 639aaa-6(a) (1), unless the authorization is terminated  or revoked sooner. When diagnostic testing is negativ e, the possibility of a false negative result should be considered in the context of a patient's recent exposures and the presence of clinical signs and symptoms consistent with COVID-19. An individual without symptoms of COVID-19 and who is not shedding SARS-CoV-2 virus would expect to have a negative (not detected) result in this assay.     Labs: CBC: Recent Labs  Lab 01/30/24 1738 01/31/24 0338  WBC 5.9 4.6  HGB 12.8 12.3  HCT 39.2 37.0  MCV 88.1 88.5  PLT 122* 98*   Basic Metabolic Panel: Recent Labs  Lab 01/30/24 1738 01/31/24 0338  NA 139 140  K 3.8 3.5  CL 103 106  CO2 23 24  GLUCOSE 113* 94  BUN 15 16  CREATININE 0.97 1.02*  CALCIUM  9.0 9.0  MG  --  1.9  PHOS  --  4.4   Liver Function Tests: No results for input(s): AST, ALT, ALKPHOS, BILITOT, PROT, ALBUMIN in the last 168 hours. CBG: Recent Labs  Lab 01/31/24 1250  GLUCAP 95    Discharge time spent: greater than 30 minutes.  This record has been created using [employer]. Errors have been sought and corrected,but may not always be located. Such creation errors do not reflect on the standard of care.   Signed: Amaryllis Dare, MD [facility] 01/31/2024

## 2024-01-31 NOTE — ED Provider Notes (Signed)
 [place] EMERGENCY DEPARTMENT AT [facility] Provider Note   CSN: [id number] Arrival date & time: 01/30/24  1514     Patient presents with: Chest Pain   Valerie Ball is a 78 y.o. female.   The patient is presenting with a chief complaint of chest discomfort, described as an aching sensation that began four days ago. Initially localized to the left side, the discomfort has since migrated centrally and occasionally radiates up the neck. The patient reports associated symptoms of shortness of breath and diaphoresis, particularly during exertion, which have been persistent over the past few days. The patient denies any fever, cough, or new leg swelling. There is a history of coronary artery disease, with a stent placement last December and a subsequent catheterization two weeks later due to similar pain, which revealed no significant new blockages. The patient is scheduled for an echocardiogram next Thursday due to concerns about a leaky valve and a heart murmur, as noted by their cardiologist. The patient experiences orthostatic dizziness, primarily upon standing. The patient has not taken nitroglycerin  for this episode. History was obtained from the patient.     Chest Pain      Prior to Admission medications   Medication Sig Start Date End Date Taking? Authorizing Provider  amLODipine  (NORVASC ) 2.5 MG tablet Take 1 tablet (2.5 mg total) by mouth daily. 10/02/23   Tower, Laine LABOR, MD  aspirin  EC 81 MG tablet Take 1 tablet (81 mg total) by mouth daily. Swallow whole. 02/05/23   Madireddy, Alean SAUNDERS, MD  clopidogrel  (PLAVIX ) 75 MG tablet Take 1 tablet (75 mg total) by mouth daily. 05/14/23   Madireddy, Alean SAUNDERS, MD  Dulaglutide  (TRULICITY ) 3 MG/0.5ML SOAJ INJECT 0.5 ML (3 MG) UNDER THE SKIN ONCE A WEEK 10/01/23   Trixie File, MD  empagliflozin  (JARDIANCE ) 25 MG TABS tablet TAKE 1 TABLET DAILY BEFORE BREAKFAST 12/14/23   Trixie File, MD  ezetimibe  (ZETIA ) 10 MG tablet  TAKE 1 TABLET DAILY 08/03/23   Tower, Laine LABOR, MD  famotidine  (PEPCID ) 20 MG tablet TAKE 1 TABLET TWICE A DAY 12/14/23   Tower, Laine LABOR, MD  FLUoxetine  (PROZAC ) 40 MG capsule TAKE 1 CAPSULE DAILY 12/14/23   Tower, Laine LABOR, MD  fluticasone  (FLONASE ) 50 MCG/ACT nasal spray USE 2 SPRAYS IN EACH NOSTRIL DAILY AS NEEDED FOR ALLERGIES 03/10/21   Tower, Laine LABOR, MD  gabapentin  (NEURONTIN ) 100 MG capsule Take 2 capsules (200 mg total) by mouth at bedtime. 10/02/23   Tower, Laine LABOR, MD  isosorbide  mononitrate (IMDUR ) 30 MG 24 hr tablet Take 1 tablet (30 mg total) by mouth daily. 05/14/23   Madireddy, Alean SAUNDERS, MD  levothyroxine  (SYNTHROID ) 50 MCG tablet Take 1 tablet (50 mcg total) by mouth daily before breakfast. 05/09/23   Tower, Laine LABOR, MD  losartan  (COZAAR ) 25 MG tablet Take 1 tablet (25 mg total) by mouth daily. 12/18/23 03/17/24  Madireddy, Alean SAUNDERS, MD  metoprolol  succinate (TOPROL -XL) 25 MG 24 hr tablet Take 1 tablet (25 mg total) by mouth daily. Take with or immediately following a meal. 09/25/23 06/21/24  Madireddy, Alean SAUNDERS, MD  Multiple Vitamin (MULTIVITAMIN WITH MINERALS) TABS tablet Take 1 tablet by mouth daily.    [provider]  nitroGLYCERIN  (NITROSTAT ) 0.4 MG SL tablet Place 1 tablet (0.4 mg total) under the tongue every 5 (five) minutes as needed for chest pain. 03/08/23   Williams, Evan, PA-C  pantoprazole  (PROTONIX ) 20 MG tablet Take 1 tablet (20 mg total) by mouth daily.  03/08/23   Williams, Evan, PA-C  repaglinide  (PRANDIN ) 1 MG tablet TAKE 1 TABLET DAILY BEFORE SUPPER 12/14/23   Trixie File, MD  rOPINIRole  (REQUIP ) 1 MG tablet TAKE 1 TABLET AT BEDTIME 08/03/23   Tower, Laine LABOR, MD  rosuvastatin  (CRESTOR ) 20 MG tablet Take 1 tablet (20 mg total) by mouth daily. 05/14/23 05/13/24  Madireddy, Alean SAUNDERS, MD    Allergies: Arimidex [anastrozole], Glipizide , Metformin  and related, and Penicillins    Review of Systems  Cardiovascular:  Positive for chest pain.    Updated Vital  Signs BP 127/70 (BP Location: Right Arm)   Pulse 71   Temp 98.1 F (36.7 C)   Resp (!) 22   LMP 03/20/1981   SpO2 99%   Physical Exam Vitals and nursing note reviewed.  Constitutional:      Appearance: She is well-developed.  HENT:     Head: Normocephalic and atraumatic.  Cardiovascular:     Rate and Rhythm: Normal rate and regular rhythm.     Heart sounds: Murmur heard.  Pulmonary:     Effort: No respiratory distress.     Breath sounds: No stridor.  Abdominal:     General: There is no distension.  Musculoskeletal:     Cervical back: Normal range of motion.  Neurological:     Mental Status: She is alert.     (all labs ordered are listed, but only abnormal results are displayed) Labs Reviewed  BASIC METABOLIC PANEL WITH GFR - Abnormal; Notable for the following components:      Result Value   Glucose, Bld 113 (*)    All other components within normal limits  CBC - Abnormal; Notable for the following components:   Platelets 122 (*)    All other components within normal limits  TROPONIN I (HIGH SENSITIVITY)  TROPONIN I (HIGH SENSITIVITY)    EKG: EKG Interpretation Date/Time:  Wednesday January 30 2024 17:19:39 EST Ventricular Rate:  73 PR Interval:  152 QRS Duration:  72 QT Interval:  454 QTC Calculation: 500 R Axis:   -25  Text Interpretation: Normal sinus rhythm Possible Left atrial enlargement Inferior infarct , age undetermined Anterolateral infarct , age undetermined Abnormal ECG When compared with ECG of 27-Mar-2023 09:17, PREVIOUS ECG IS PRESENT Confirmed by Lorette Mayo [phone]) on 01/31/2024 1:00:13 AM  Radiology: [facility] Chest 2 View Result Date: 01/30/2024 CLINICAL DATA:  Chest pain and shortness of breath. EXAM: DG CHEST 2V COMPARISON:  Chest radiograph dated 04/01/2023. FINDINGS: Minimal left lung base atelectasis. No focal consolidation, pleural effusion or pneumothorax. The cardiac silhouette is within normal limits. No acute osseous pathology.  IMPRESSION: No active cardiopulmonary disease. Electronically Signed   By: Vanetta Chou M.D.   On: 01/30/2024 18:14     Procedures   Medications Ordered in the ED  aspirin  chewable tablet 324 mg (has no administration in time range)    Clinical Course as of 01/31/24 0138  Thu Jan 31, 2024  0137 Spoke with Dr. Cyrena concerning symptoms and history. Plan for observation with medicine, cards consult in AM for further workup/recommendations. ASA ordered.  [JM]    Clinical Course User Index [JM] Kelty Szafran, Mayo, MD                                 Medical Decision Making Risk OTC drugs. Decision regarding hospitalization.   History of coronary disease with symptoms of feel similar to previous.  Heart murmur which  may be new and orthostatic symptoms possibly worsening aortic valve stenosis.  EKG troponins are normal.  No evidence of acute acute coronary syndrome however with her history, explanation of symptoms I discussed with cardiology who recommended observation and cardiology consultation in morning for further workup.  Final diagnoses:  Chest pain, unspecified type  Heart murmur    ED Discharge Orders     None          Valerie Ball, Selinda, MD 01/31/24 2302

## 2024-01-31 NOTE — Progress Notes (Signed)
 Echocardiogram 2D Echocardiogram has been performed.  Juliene JINNY Rucks 01/31/2024, 3:36 PM

## 2024-01-31 NOTE — Consult Note (Addendum)
 Cardiology Consultation   Patient ID: Valerie Ball MRN: [id number]; DOB: 04/19/1945  Admit date: 01/30/2024 Date of Consult: 01/31/2024  PCP:  Valerie Ball LABOR, Ball   [facility] Providers Cardiologist:  Alean SAUNDERS Madireddy, Ball    Patient Profile: Valerie Ball is a 78 y.o. 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  History of Present Illness: Valerie Ball underwent LHC 03/18/2023 in the setting of unstable angina.  Cath showed a severe mid LAD stenosis that was treated with PTCA/DES x 1.  There was also moderate stenosis of the RCA though this was not flow-limiting. She was maximized on medical therapy.  She then presented to the hospital on 03/2023 for chest pain.  Her work-up included LHC 03/2023 which showed a patent LAD stent with moderate nonobstructive coronary artery disease and microvascular dysfunction. The RCA disease was stable, and medical therapy was recommended.  Limited echo during this time showed LVEF > 75% with no RWMA.  Moderate thickening of the aortic valve without stenosis or insufficiency.  Echo at that time also suggesting patient was hypovolemic.  She followed up outpatient with Dr. Liborio several times at her last appointment on 01/10/2024 she was doing okay, denied chest pain or shortness of breath though noted having some fatigue which at that appointment was attributed to her mood and noncardiac etiology.  Presented to the ED on 11/12 for chest pain that was described as a heaviness,  centrally located extending to the right side and up the right neck.  The chest pain was cyclical and patient had difficulty describing exacerbating factors.  No exertional component. In the ED she was normotensive without new oxygen requirement.   ECG sinus rhythm with inferior Q waves, TWI in lateral leads VR 73 [similar to 03/2023 ECG] CXR unremarkable  Pertinent lab work: Unremarkable BMP, CBC showing chronic thrombocytopenia 122 ->98 Troponin negative x 2 In the ED patient received an aspirin  load and some of her usual PTA medications.  On interview, she shared that she notices the chest pain most at night when she lays down though cannot confirm if chest pain is worse flat. May notice it after eating, though cannot confirm if eating makes the chest pain worse either.  Has noted some shortness of breath on exertion, sharing that she does not have to exert herself to feel short of breath/fatigued. Denied orthopnea, PND, and peripheral edema. No recent illness.  Past Medical History:  Diagnosis Date   Allergic rhinitis 08/22/2006   Qualifier: Diagnosis of   By: Valerie Ball, Valerie Ball      IMO SNOMED Dx Update Oct 2024     Allergy    allegic rhinitis   Alternating constipation and diarrhea 08/12/2015   Anemia    Angiodysplasia of colon    Anxiety    Arthritis    AVM (arteriovenous malformation) of colon    Bilateral knee pain 05/11/2022   Blurred vision, right eye 08/11/2021   Patient does have blurred vision right eye,     Borderline diabetic    per patient medical history form   Branch retinal vein  occlusion with macular edema of left eye (HCC) 07/14/2019   CME superior to the fovea OS has worsened at 7-week interval from macular branch retinal vein occlusion, repeat Avastin  today and evaluate exam in 6 weeks     Breast cancer (HCC) 02/2009   breast CA L invasive ductal CA(hormone receptor positive.)   CAD (coronary artery disease)  CT Cardiac 01/19/2023 calcium  score 411,  plaque volume 366, moderate stenosis LAD, mild stenosis D1, RCA, LCx/OM 3 02/01/2023   Chest pain 12/29/2022   Chronic idiopathic thrombocytopenia (HCC) 03/23/2023   Colon cancer screening 01/23/2014   Colon polyp    hyperplastic   Colon stricture (HCC) 01/14/2003   Cystoid macular edema of left eye 07/14/2019   Decreased calculated GFR 05/11/2022   Diabetic gastroparesis (HCC) 03/27/2016   Typical symptoms -suspect      No detectable diabetic retinopathy 12-27-2020     Diabetic neuropathy (HCC) 11/25/2013   Diaphragmatic hernia 03/02/2008   Qualifier: Diagnosis of   By: Genie Ball LEODIS), Leisha      IMO SNOMED Dx Update Oct 2024     Diverticulitis of colon 10/14/2014   Diverticulosis    Dizziness 01/07/2014   Dyspnea on exertion 01/04/2023   Encounter for Medicare annual wellness exam 01/23/2014   ESOPHAGEAL STRICTURE 03/02/2008   Qualifier: Diagnosis of   By: Genie Ball (AAMA), Chick         Essential hypertension 08/22/2006   Qualifier: Diagnosis of   By: Valerie Ball, Valerie Ball         Estrogen deficiency 01/23/2014   Eye pain 03/23/2023   Fatigue 05/20/2018   Fatty liver    Fever, low grade 09/24/2020   GAD (generalized anxiety disorder) 04/15/2007   Qualifier: Diagnosis of   By: Baird FNP, Frieda Kipper        GI bleed    Gout    Hepatic steatosis    Hiatal hernia    Hip pain, bilateral 08/31/2017   History of breast cancer 08/02/2009   Qualifier: Diagnosis of   By: Randeen Ball, Ball Caldron        History of CAD (coronary artery disease) 03/23/2023   History of COVID-19 09/24/2020   History of removal of implants of both breasts 07/01/2013   06/25/13 elected to have bilateral breast implants removed.     History of seizure 08/22/2006   Qualifier: Diagnosis of   By: Valerie Ball, Valerie Ball         Hyperlipidemia    Hyperlipidemia associated with type 2 diabetes mellitus (HCC) 08/22/2006   Qualifier: Diagnosis of   By: Valerie Ball, Valerie Ball          Hypertension    Hypertensive retinopathy of left eye 07/14/2019   Hypothyroidism    IBS (irritable bowel syndrome)    Lap Nissen October 2013 01/19/2012   Laparoscopic takedown of large type III mixed hiatus hernia with primary closure of the diaphragm with 3 sutures posteriorally (post 2 with pledgets) and Nissen fundoplication over a #56 lighted bougie        Large type III mixed hiatus hernia  12/06/2011   Plan surgery to repair diaphragm and do Nissen fundoplication     Macular edema    left eye   Melena 01/2008   anemia transfusion   Menopause    per patient medical history form   Mitral stenosis and aortic incompetence, by TTE 01/25/2023 02/01/2023   Non-insulin  treated type 2 diabetes mellitus (HCC) 07/25/2010   Ask  patient to clarify. Type not noted on medical history form.     Palpitations 01/04/2023   Peritoneal adhesions 03/02/2008   Qualifier: Diagnosis of   By: Genie Ball LEODIS), Chick      IMO SNOMED Dx Update Oct 2024     Poor balance 01/30/2023   Poorly controlled type 2 diabetes mellitus with neuropathy (HCC) 08/19/2007   Qualifier: Diagnosis of   By: Randeen Ball, Ball Caldron        Posterior vitreous detachment of right eye 11/06/2019   Postmenopausal hormone therapy    per patient medical history form.   Retinal vein occlusion, branch (HCC) 01/03/2016   Left with hemorrhage and edema (Dr Elner) 2017     Routine general medical examination at a health care facility 07/11/2011   Salmonella 05/2000   renal effects secondary to dehydration   Seizure disorder (HCC)    Seizures (HCC)    one seizure several yrs ago --etiology unknown-no problem since   Stress reaction 10/25/2011   SYNDROME, CARPAL TUNNEL 08/23/2006   Qualifier: Diagnosis of   By: Randeen Ball, Ball Caldron        SYNDROME, RESTLESS LEGS 08/23/2006   Qualifier: Diagnosis of   By: Randeen Ball, Ball Caldron        Thrombocytopenia 11/25/2013   TRANSAMINASES, SERUM, ELEVATED 11/11/2009   Qualifier: Diagnosis of   By:  Randeen Ball, Ball Caldron        Tremor 01/20/2014   Head and right hand      Vitamin B12 deficiency 09/21/2016   Vitreous floaters of left eye 07/14/2019   The nature of vitreous floaters was discussed with the patient as well as their frequent occurrence with development of posterior vitreous detachment. The significance of flashes and field cuts was discussed with the patient. The need for a dilated fundus examination was discussed and advice given to return immediately for new or different floaters .  More uncommonly, vitreous floaters, debris, or    Past Surgical History:  Procedure Laterality Date   ABDOMINAL HYSTERECTOMY  1983   BREAST SURGERY  02/2009   breast biopsy invasive ductal CA-bilateral mastectomies   CORONARY PRESSURE/FFR STUDY N/A 03/27/2023   Procedure: CORONARY PRESSURE/FFR STUDY;  Surgeon: Wendel Lurena POUR, Ball;  Location: [facility];  Service: Cardiovascular;  Laterality: N/A;   CORONARY STENT INTERVENTION N/A 03/08/2023   Procedure: CORONARY STENT INTERVENTION;  Surgeon: Verlin Lonni BIRCH, Ball;  Location: [facility];  Service: Cardiovascular;  Laterality: N/A;   EPIGASTRIC HERNIA REPAIR  01/02/2012   Procedure: HERNIA REPAIR EPIGASTRIC ADULT;  Surgeon: Donnice KATHEE Lunger, Ball;  Location: [facility];  Service: General;  Laterality: N/A;  Laparoscopic Repair Paraesophageal Hernia, Nissen   EYE SURGERY Left 07/2017   Dr. Elner GULA CYST EXCISION     LAPAROSCOPIC NISSEN FUNDOPLICATION  01/02/2012   Procedure: LAPAROSCOPIC NISSEN FUNDOPLICATION;  Surgeon: Donnice KATHEE Lunger, Ball;  Location: [facility];  Service: General;  Laterality: N/A;   LEFT HEART CATH AND CORONARY ANGIOGRAPHY N/A 03/08/2023   Procedure: LEFT HEART CATH AND CORONARY ANGIOGRAPHY;  Surgeon: Verlin Lonni BIRCH, Ball;  Location: [facility];  Service: Cardiovascular;  Laterality: N/A;   LEFT HEART CATH AND CORONARY ANGIOGRAPHY N/A 03/27/2023   Procedure: LEFT HEART CATH AND CORONARY  ANGIOGRAPHY;  Surgeon: Wendel Lurena POUR, Ball;  Location: [facility];  Service: Cardiovascular;  Laterality: N/A;   MASTECTOMY  04/2009   Bilateral for ductal carcinoma on L  OVARY SURGERY  1986   ovarian tumor removed   TUBAL LIGATION         Scheduled Meds:  aspirin  EC  81 mg Oral Daily   clopidogrel   75 mg Oral Daily   enoxaparin (LOVENOX) injection  40 mg Subcutaneous Daily   ezetimibe   10 mg Oral Daily   famotidine   20 mg Oral BID   levothyroxine   50 mcg Oral Q0600   metoprolol  succinate  12.5 mg Oral Daily   rosuvastatin   20 mg Oral Daily   Continuous Infusions:  PRN Meds: acetaminophen , melatonin, polyethylene glycol, prochlorperazine  Allergies:    Allergies  Allergen Reactions   Arimidex [Anastrozole] Other (See Comments)    Joint pain   Glipizide  Other (See Comments)    Hypoglycemia    Metformin  And Related Other (See Comments)    diarrhea   Penicillins Hives and Rash    Social History:   Social History   Socioeconomic History   Marital status: Married    Spouse name: Not on file   Number of children: 3   Years of education: Not on file   Highest education level: Not on file  Occupational History   Occupation: retired    Associate Professor: UNEMPLOYED  Tobacco Use   Smoking status: Never   Smokeless tobacco: Never  Vaping Use   Vaping status: Never Used  Substance and Sexual Activity   Alcohol use: Yes    Comment: occasional   Drug use: Never   Sexual activity: Not Currently  Other Topics Concern   Not on file  Social History Narrative   ** Merged History Encounter **       1 cup of coffee every morning   Social Drivers of Health   Financial Resource Strain: Low Risk  (02/28/2023)   Overall Financial Resource Strain (CARDIA)    Difficulty of Paying Living Expenses: Not hard at all  Food Insecurity: No Food Insecurity (03/24/2023)   Hunger Vital Sign    Worried About Running Out of Food in the Last Year: Never true    Ran Out of Food in  the Last Year: Never true  Transportation Needs: No Transportation Needs (03/24/2023)   PRAPARE - Administrator, Civil Service (Medical): No    Lack of Transportation (Non-Medical): No  Physical Activity: Inactive (02/28/2023)   Exercise Vital Sign    Days of Exercise per Week: 0 days    Minutes of Exercise per Session: 0 min  Stress: Stress Concern Present (02/28/2023)   [employer] of Occupational Health - Occupational Stress Questionnaire    Feeling of Stress : Rather much  Social Connections: Socially Integrated (03/24/2023)   Social Connection and Isolation Panel    Frequency of Communication with Friends and Family: Twice a week    Frequency of Social Gatherings with Friends and Family: Once a week    Attends Religious Services: More than 4 times per year    Active Member of [employer] or Organizations: Yes    Attends Banker Meetings: 1 to 4 times per year    Marital Status: Married  Catering Manager Violence: Not At Risk (03/24/2023)   Humiliation, Afraid, Rape, and Kick questionnaire    Fear of Current or Ex-Partner: No    Emotionally Abused: No    Physically Abused: No    Sexually Abused: No    Family History:   Family History  Problem Relation Age of Onset   Hypertension Mother    Stroke Mother  Breast cancer Mother    Heart attack Father    Diabetes Father    Esophageal cancer Father    Breast cancer Sister        x 2 sisters   Diabetes Sister    Hypertension Sister    Breast cancer Sister    Hypertension Brother    Diabetes Brother      ROS:  Please see the history of present illness.  All other ROS reviewed and negative.     Physical Exam/Data: Vitals:   01/31/24 0200 01/31/24 0300 01/31/24 0813 01/31/24 1100  BP: (!) 114/54  127/61 118/67  Pulse: 67  69 67  Resp: 18  18 18   Temp:   98.1 F (36.7 C) 98.2 F (36.8 C)  TempSrc:   Oral Oral  SpO2: 98% 98% 98% 98%   No intake or output data in the 24 hours ending 01/31/24  1209    01/10/2024    1:03 PM 08/16/2023   10:15 AM 08/15/2023    3:42 PM  Last 3 Weights  Weight (lbs) 141 lb 12.8 oz 145 lb 6.4 oz 146 lb  Weight (kg) 64.32 kg 65.953 kg 66.225 kg     There is no height or weight on file to calculate BMI.  General:  Well nourished, well developed, in no acute distress HEENT: normal Neck: no JVD Vascular: Distal pulses 2+ bilaterally Cardiac:  normal S1, S2; RRR; no murmur. Tenderness to chest wall overlying left side.  Lungs:  clear to auscultation bilaterally, no wheezing, rhonchi or rales  Abd: soft, tenderness to LUQ, no hepatomegaly  Ext: no edema Musculoskeletal:  No deformities, BUE and BLE strength normal and equal Skin: warm and dry  Neuro:  CNs 2-12 intact, no focal abnormalities noted Psych:  Normal affect   EKG:  The EKG was personally reviewed and demonstrates: See HPI Telemetry:  Telemetry was personally reviewed and demonstrates:  NSR HR 70  Relevant CV Studies: LHC 03/2023     Mid LAD-1 lesion is 30% stenosed.   Mid LAD-2 lesion is 50% stenosed.   Mid Cx lesion is 20% stenosed.   Ost RCA to Prox RCA lesion is 50% stenosed.   2nd Mrg lesion is 20% stenosed.   Non-stenotic Mid LAD-3 lesion was previously treated.   1.  Patent mid LAD stent with mild proximal and mid disease relatively unchanged versus prior study. 2.  Ostial right coronary artery disease which is modest angiographically; RFR was 0.99. 3.  Coronary microvascular dysfunction with CFR of 1.8 (abnormal being less than 2-2.5) however IMR is normal at 9 (normal being less than 25) 4.  LVEDP of 25 mmHg. 5.  Right upper extremity tortuosity requiring 75 cm destination sheath for coronary artery cannulation.   Recommendation: Medical therapy.   Echocardiogram complete 01/2023 IMPRESSIONS     1. Left ventricular ejection fraction, by estimation, is 60 to 65%. The  left ventricle has normal function. The left ventricle has no regional  wall motion abnormalities.  There is mild left ventricular hypertrophy.  Left ventricular diastolic parameters  are consistent with Grade I diastolic dysfunction (impaired relaxation).  The average left ventricular global longitudinal strain is -14.2 %. The  global longitudinal strain is abnormal.   2. Right ventricular systolic function is normal. The right ventricular  size is normal.   3. MVA P1/2t 2.18 cm2. The mitral valve is normal in structure. No  evidence of mitral valve regurgitation. Mild to moderate mitral stenosis.  The mean mitral  valve gradient is 4.5 mmHg.   4. The aortic valve is calcified. There is mild calcification of the  aortic valve. There is mild thickening of the aortic valve. Aortic valve  regurgitation is mild. No aortic stenosis is present.   5. The inferior vena cava is normal in size with greater than 50%  respiratory variability, suggesting right atrial pressure of 3 mmHg.   Laboratory Data: High Sensitivity Troponin:   Recent Labs  Lab 01/30/24 1738 01/30/24 1932  TROPONINIHS 5 5     Chemistry Recent Labs  Lab 01/30/24 1738 01/31/24 0338  NA 139 140  K 3.8 3.5  CL 103 106  CO2 23 24  GLUCOSE 113* 94  BUN 15 16  CREATININE 0.97 1.02*  CALCIUM  9.0 9.0  MG  --  1.9  GFRNONAA >60 57*  ANIONGAP 13 10    Hematology Recent Labs  Lab 01/30/24 1738 01/31/24 0338  WBC 5.9 4.6  RBC 4.45 4.18  HGB 12.8 12.3  HCT 39.2 37.0  MCV 88.1 88.5  MCH 28.8 29.4  MCHC 32.7 33.2  RDW 14.8 15.0  PLT 122* 98*   Radiology/Studies:  DG Chest 2 View Result Date: 01/30/2024 CLINICAL DATA:  Chest pain and shortness of breath. EXAM: DG CHEST 2V COMPARISON:  Chest radiograph dated 04/01/2023. FINDINGS: Minimal left lung base atelectasis. No focal consolidation, pleural effusion or pneumothorax. The cardiac silhouette is within normal limits. No acute osseous pathology. IMPRESSION: No active cardiopulmonary disease. Electronically Signed   By: Vanetta Chou M.D.   On: 01/30/2024 18:14      Assessment and Plan: Chest pain Patient has had chest pain for several days. Currently having mild chest discomfort.  ECG without acute ischemic findings, troponin negative. Patient shared that chest pain is similar to her last two presenting chest pain episodes, one requiring stent placement, and one without intervention. She has known microvascular dysfunction that could be at play though her symptoms are very atypical. Additionally her troponin has remained negative despite several days of discomfort, though on chart review she has been troponin negative during every admission.  Echocardiogram pending, will assess for RWMA. At this time do not suspect we need to pursue cardiac catheterization though could consider PET stress outpatient.   CAD Microvascular dysfunction PTA medication amlodipine  2.5, Imdur  30, losartan  25, metoprolol  succinate 25  Continue aspirin  81 po qday Continue Plavix  75mg  po qday Restart and increase PTA amlodipine  2.5 mg to 5 mg po day Restart imdur  30 mg po qday  Increase metoprolol  succinate 12.5 mg to 25 mg po qday  Can consider starting ranexa 500 mg BID once optimized above, has a history of NASH though normal LFTs  Hypertension PTA medication amlodipine  2.5, Imdur  30, losartan  25, metoprolol  succinate 25 BP: 148/71 Restart medications as above Restart losartan  25 mg  Hyperlipidemia Lipid panel and lipoprotein (a) for am  Continue Crestor  20 Continue zetia  10  Mitral stenosis Mild to moderate MS is seen on echocardiogram in 2024. She is scheduled to have a repeat echocardiogram this month.  Will go ahead and obtain inpatient as above.  T2DM PTA Jardiance  25 mg, though added CVD benefit. Medication on hold.  Per primary  Per primary Hypothyroidism IBS Hiatal hernia s/p unsuccessful repair/GERD/gastroparesis Chronic thrombocytopenia History of seizures NASH Diabetes Anxiety   Risk Assessment/Risk Scores:       For questions or  updates, please contact [facility] Please consult [email or web address] for contact info under   Signed, Leontine LOISE Salen, PA-C  01/31/2024   ADDENDUM:   Patient seen and examined with Leontine Salen PA-C.  I personally taken a history, examined the patient, reviewed relevant notes,  laboratory data / imaging studies.  I performed a substantive portion of this encounter and formulated the important aspects of the plan.  I agree with the APP's note, impression, and recommendations; however, I have edited the note to reflect changes or salient points.   Cardiology consulted for evaluation of chest pain. Patient been experiencing precordial pain over the last 3 to 4 days, initially in the left side now migrating substernally and to the right side.  She noticed radiation to the right neck.  The discomfort is random.  Not brought on by effort related activities and does not resolve with rest.  Her symptoms are much less intense when compared to her prior episode in December 2024.  She denies heart failure symptoms.  Patient has been compliant with medical therapy.  PHYSICAL EXAM: Today's Vitals   01/31/24 1243 01/31/24 1626 01/31/24 2034 01/31/24 2042  BP: (!) 148/71 (!) 148/71  107/67  Pulse: 62 62  77  Resp: 18 18  17   Temp: (!) 97.5 F (36.4 C) 98.2 F (36.8 C)  98.4 F (36.9 C)  TempSrc: Oral Oral    SpO2: 97%   97%  Height:  5' 2 (1.575 m)    PainSc: 2   2     Body mass index is 25.94 kg/m.   Net IO Since Admission: 480 mL [01/31/24 2331]  There were no vitals filed for this visit.  General: Age-appropriate, hemodynamically stable, no acute distress HEENT: Normocephalic, atraumatic, trachea midline, no JVP Lungs: Clear to auscultation bilaterally no wheezes rales or rhonchi's Heart: Regular, positive S1-S2, no murmurs rubs or gallops appreciated Abdomen: Soft, nontender, nondistended, positive bowel sounds in all 4 quadrants Extremities: Warm to touch bilaterally, no  pitting edema Neuro: No focal neurological deficits, moving all 4 extremities Psych: Cooperative and appropriate  EKG: (personally reviewed by me) EKG illustrates sinus rhythm without underlying injury pattern  Telemetry: (personally reviewed by me) Sinus   Impression & Recommendations: :  Precordial pain. Coronary artery disease status post coronary intervention. Microvascular dysfunction Patient presents to the hospital for evaluation of chest pain symptoms are noncardiac. Her symptoms currently less intense when compared to her episode in December 2024 EKG is nonischemic and similar to prior tracing Echocardiogram performed today notes preserved LVEF, and no regional wall motion abnormalities High sensitive troponins are negative x 2 She initially had her In December 2024 and the most recent cath was in January 2025. Very low probability of new obstructive disease since her recent cath Recommend uptitration of antianginal therapy Increase amlodipine  from 2.5 mg p.o. daily to 5 mg p.o. daily. Increase Toprol -XL from 12.5 mg p.o. daily to 25 mg p.o. daily Very reasonable to have her follow-up as outpatient to reevaluate symptoms.  May consider Ranexa for refractory symptoms +/- cardiac PET/CT to evaluate for reversible ischemia and myocardial blood flow. She will complete dual antiplatelet therapy until December 2025  Benign essential hypertension: Resume home medications and antianginal therapy as discussed above  Hyperlipidemia: Fasting lipids and LP(a) pending.  Currently on Crestor  and Zetia   Plan of care discussed with attending physician.    This note was created using a voice recognition software as a result there may be grammatical errors inadvertently enclosed that do not reflect the nature of this encounter. Every attempt is made to correct such errors.   Willaim Mode  Michele HAS, [facility]  A Division of [facility] [address]  [place], [place] [address]  Pager: [phone] Office: [phone] 01/31/2024

## 2024-01-31 NOTE — ED Notes (Signed)
 Pt in restroom for one time non volume urine occurrence.

## 2024-01-31 NOTE — Plan of Care (Signed)

## 2024-01-31 NOTE — ED Notes (Signed)
ED Provider at bedside to discuss disposition 

## 2024-01-31 NOTE — Hospital Course (Addendum)
 Partly taken from H&P.   Valerie Ball is a 78 y.o. female with medical history significant for CAD status post PCI with LAD stent (03/08/23) on DAPT, hypertension, hyperlipidemia, hypothyroidism, seizure disorder, chronic thrombocytopenia, NASH, breast cancer status post bilateral mastectomies, who presents to the ER with complaints of chest pain for the past 4 to 5 days.   On presentation hemodynamically stable, EKG without any evidence of acute ischemia.  High sensitive troponin negative x 2.  As patient continued to have some chest discomfort, cardiology was consulted and they recommended admission under observation.  11/13: Vital stable, labs stable with platelets of 98, seems like having chronic thrombocytopenia. Echocardiogram with normal EF, grade 2 diastolic dysfunction and no regional wall motion abnormalities. ACS ruled out.  Cardiology cleared her for discharge.  They will follow-up as outpatient and if her symptoms persist they will do outpatient cardiac CT.  Home amlodipine  dose was increased from 2.5 mg to 5 mg daily and metoprolol  dose was decreased from 25 to 12.5 mg daily.  Patient will continue the rest of her home medications and follow-up with her providers for further assistance.

## 2024-02-01 LAB — LIPID PANEL
Cholesterol: 75 mg/dL (ref 0–200)
HDL: 30 mg/dL — ABNORMAL LOW (ref >40)
LDL Cholesterol: 18 mg/dL (ref 0–99)
Total CHOL/HDL Ratio: 2.5 ratio
Triglycerides: 135 mg/dL (ref <150)
VLDL: 27 mg/dL (ref 0–40)

## 2024-02-01 NOTE — Plan of Care (Signed)
 Patient resting in bed at this time. No acute changes throughout the shift. No s/s or c/o distress or discomfort.   Problem: Education: Goal: Knowledge of General Education information will improve Description: Including pain rating scale, medication(s)/side effects and non-pharmacologic comfort measures Outcome: Progressing   Problem: Health Behavior/Discharge Planning: Goal: Ability to manage health-related needs will improve Outcome: Progressing   Problem: Clinical Measurements: Goal: Ability to maintain clinical measurements within normal limits will improve Outcome: Progressing Goal: Will remain free from infection Outcome: Progressing Goal: Diagnostic test results will improve Outcome: Progressing Goal: Respiratory complications will improve Outcome: Progressing Goal: Cardiovascular complication will be avoided Outcome: Progressing   Problem: Activity: Goal: Risk for activity intolerance will decrease Outcome: Progressing   Problem: Nutrition: Goal: Adequate nutrition will be maintained Outcome: Progressing   Problem: Coping: Goal: Level of anxiety will decrease Outcome: Progressing   Problem: Elimination: Goal: Will not experience complications related to bowel motility Outcome: Progressing Goal: Will not experience complications related to urinary retention Outcome: Progressing   Problem: Pain Managment: Goal: General experience of comfort will improve and/or be controlled Outcome: Progressing   Problem: Safety: Goal: Ability to remain free from injury will improve Outcome: Progressing   Problem: Skin Integrity: Goal: Risk for impaired skin integrity will decrease Outcome: Progressing

## 2024-02-04 ENCOUNTER — Telehealth: Payer: Self-pay

## 2024-02-04 NOTE — Transitions of Care (Post Inpatient/ED Visit) (Signed)
 02/04/2024  Name: Valerie Ball MRN: [id number] DOB: [birth year]  Today's TOC FU Call Status: Today's TOC FU Call Status:: Successful TOC FU Call Completed TOC FU Call Complete Date: 02/04/24  Patient's Name and Date of Birth confirmed. DOB, Name  Transition Care Management Follow-up Telephone Call Date of Discharge: 02/01/24 Type of Discharge: Inpatient Admission Primary Inpatient Discharge Diagnosis:: Chest Pain How have you been since you were released from the hospital?: Better Any questions or concerns?: No  Items Reviewed: Did you receive and understand the discharge instructions provided?: Yes Medications obtained,verified, and reconciled?: Yes (Medications Reviewed) Any new allergies since your discharge?: No Dietary orders reviewed?: NA Do you have support at home?: Yes People in Home [RPT]: spouse  Medications Reviewed Today: Medications Reviewed Today     Reviewed by Lavelle Charmaine NOVAK, LPN (Licensed Practical Nurse) on 02/04/24 at 1336  Med List Status: <None>   Medication Order Taking? Sig Documenting Provider Last Dose Status Informant  amLODipine  (NORVASC ) 5 MG tablet [id number]  Take 1 tablet (5 mg total) by mouth daily. Caleen Qualia, MD  Active   aspirin  EC 81 MG tablet [id number] No Take 1 tablet (81 mg total) by mouth daily. Swallow whole.  Patient taking differently: Take 81 mg by mouth at bedtime. Swallow whole.   MadireddyAlean SAUNDERS, MD 01/29/2024 Active Self, Pharmacy Records  clopidogrel  (PLAVIX ) 75 MG tablet [id number] No Take 1 tablet (75 mg total) by mouth daily.  Patient taking differently: Take 75 mg by mouth at bedtime.   MadireddyAlean SAUNDERS, MD 01/29/2024 Active Self, Pharmacy Records  Dulaglutide  (TRULICITY ) 3 MG/0.5ML SOAJ [id number] No INJECT 0.5 ML (3 MG) UNDER THE SKIN ONCE A JANET Trixie File, MD 01/26/2024 Active Self, Pharmacy Records  empagliflozin  (JARDIANCE ) 25 MG TABS tablet [id number] No TAKE 1 TABLET DAILY BEFORE OFILIA Trixie File, MD 01/30/2024 Active Self, Pharmacy Records  ezetimibe  (ZETIA ) 10 MG tablet [id number] No TAKE 1 TABLET DAILY  Patient taking differently: Take 10 mg by mouth at bedtime.   Tower, Laine LABOR, MD 01/29/2024 Active Self, Pharmacy Records  famotidine  (PEPCID ) 20 MG tablet [id number] No TAKE 1 TABLET TWICE A DAY Tower, Laine LABOR, MD 01/29/2024 Active Self, Pharmacy Records  FLUoxetine  (PROZAC ) 40 MG capsule [id number] No TAKE 1 CAPSULE DAILY  Patient taking differently: Take 40 mg by mouth at bedtime.   Tower, Laine LABOR, MD 01/29/2024 Active Self, Pharmacy Records  fluticasone  (FLONASE ) 50 MCG/ACT nasal spray [id number] No USE 2 SPRAYS IN EACH NOSTRIL DAILY AS NEEDED FOR ALLERGIES  Patient taking differently: Place 2 sprays into both nostrils daily as needed for allergies.   Tower, Laine LABOR, MD Unknown Active Self, Pharmacy Records  gabapentin  (NEURONTIN ) 100 MG capsule [id number] No Take 2 capsules (200 mg total) by mouth at bedtime. Tower, Laine LABOR, MD 01/29/2024 Active Self, Pharmacy Records  isosorbide  mononitrate (IMDUR ) 30 MG 24 hr tablet [id number] No Take 1 tablet (30 mg total) by mouth daily.  Patient taking differently: Take 30 mg by mouth at bedtime.   MadireddyAlean SAUNDERS, MD 01/29/2024 Active Self, Pharmacy Records  levothyroxine  (SYNTHROID ) 50 MCG tablet [id number] No Take 1 tablet (50 mcg total) by mouth daily before breakfast. Randeen Laine LABOR, MD 01/30/2024 Active Self, Pharmacy Records           Med Note (CRUTHIS, CHLOE C   Thu Jan 31, 2024  9:54 AM) Pt is adamant she is still taking this medication daily. Dispense report does not support this claim.   losartan  (  COZAAR ) 25 MG tablet [id number] No Take 1 tablet (25 mg total) by mouth daily.  Patient taking differently: Take 25 mg by mouth at bedtime.   MadireddyAlean SAUNDERS, MD 01/29/2024 Active Self, Pharmacy Records           Med Note (CRUTHIS, CHLOE C   Thu Jan 31, 2024  9:54 AM) Pt is adamant she is still taking this  medication daily. Dispense report does not support this claim.   metoprolol  succinate (TOPROL -XL) 25 MG 24 hr tablet [id number]  Take 0.5 tablets (12.5 mg total) by mouth daily. Amin, Sumayya, MD  Active   Multiple Vitamin (MULTIVITAMIN WITH MINERALS) TABS tablet [id number] No Take 1 tablet by mouth at bedtime. [provider] 01/29/2024 Active Self, Pharmacy Records  nitroGLYCERIN  (NITROSTAT ) 0.4 MG SL tablet [id number] No Place 1 tablet (0.4 mg total) under the tongue every 5 (five) minutes as needed for chest pain. Trudy Birmingham, PA-C Unknown Active Self, Pharmacy Records  repaglinide  (PRANDIN ) 1 MG tablet [id number] No TAKE 1 TABLET DAILY BEFORE SUPPER  Patient taking differently: Take 1 mg by mouth at bedtime.   Trixie File, MD 01/29/2024 Active Self, Pharmacy Records           Med Note (CRUTHIS, CHLOE C   Thu Jan 31, 2024  9:56 AM) No fill hx found for this medication. Pt is adamant she is taking it daily.   rOPINIRole  (REQUIP ) 1 MG tablet [id number] No TAKE 1 TABLET AT BEDTIME Tower, Laine LABOR, MD 01/29/2024 Active Self, Pharmacy Records  rosuvastatin  (CRESTOR ) 20 MG tablet [id number] No Take 1 tablet (20 mg total) by mouth daily.  Patient taking differently: Take 20 mg by mouth at bedtime.   MadireddyAlean SAUNDERS, MD 01/29/2024 Active Self, Pharmacy Records            Home Care and Equipment/Supplies: Were Home Health Services Ordered?: NA Any new equipment or medical supplies ordered?: NA  Functional Questionnaire: Do you need assistance with bathing/showering or dressing?: No Do you need assistance with meal preparation?: No Do you need assistance with eating?: No Do you have difficulty maintaining continence: No Do you need assistance with getting out of bed/getting out of a chair/moving?: No Do you have difficulty managing or taking your medications?: No  Follow up appointments reviewed: PCP Follow-up appointment confirmed?: Yes Date of PCP follow-up  appointment?: 02/06/24 Follow-up Provider: Dr. Laine [facility] Follow-up appointment confirmed?: Yes Date of Specialist follow-up appointment?: 02/07/24 Follow-Up Specialty Provider:: Cardiology Do you need transportation to your follow-up appointment?: No Do you understand care options if your condition(s) worsen?: Yes-patient verbalized understanding    SIGNATURE Charmaine Bloodgood, LPN [facility] Health Advisor [facility] You Are. We Are. [facility] Direct Dial [phone]

## 2024-02-05 LAB — LIPOPROTEIN A (LPA): Lipoprotein (a): 16.6 nmol/L (ref <75.0)

## 2024-02-06 ENCOUNTER — Ambulatory Visit (INDEPENDENT_AMBULATORY_CARE_PROVIDER_SITE_OTHER): Admitting: Family Medicine

## 2024-02-06 VITALS — BP 128/70 | HR 80 | Temp 98.7°F | Ht 62.0 in | Wt 142.5 lb

## 2024-02-06 DIAGNOSIS — R079 Chest pain, unspecified: Secondary | ICD-10-CM

## 2024-02-06 DIAGNOSIS — E860 Dehydration: Secondary | ICD-10-CM

## 2024-02-06 DIAGNOSIS — K3184 Gastroparesis: Secondary | ICD-10-CM

## 2024-02-06 DIAGNOSIS — D696 Thrombocytopenia, unspecified: Secondary | ICD-10-CM

## 2024-02-06 DIAGNOSIS — J029 Acute pharyngitis, unspecified: Secondary | ICD-10-CM | POA: Insufficient documentation

## 2024-02-06 DIAGNOSIS — I2510 Atherosclerotic heart disease of native coronary artery without angina pectoris: Secondary | ICD-10-CM | POA: Diagnosis not present

## 2024-02-06 DIAGNOSIS — I10 Essential (primary) hypertension: Secondary | ICD-10-CM

## 2024-02-06 DIAGNOSIS — Z7985 Long-term (current) use of injectable non-insulin antidiabetic drugs: Secondary | ICD-10-CM

## 2024-02-06 DIAGNOSIS — E1143 Type 2 diabetes mellitus with diabetic autonomic (poly)neuropathy: Secondary | ICD-10-CM

## 2024-02-06 HISTORY — DX: Acute pharyngitis, unspecified: J02.9

## 2024-02-06 HISTORY — DX: Dehydration: E86.0

## 2024-02-06 NOTE — Assessment & Plan Note (Addendum)
 Recently hosp for left chest pain  Reviewed hospital records, lab results and studies in detail  Ruled out for ACS Reassuring work up  Echo noted grade 2 DD  Normal vitals today  Cp is back to baseline (left in axilla-thinks due to mastectomy in past)  GI situation fairly stable Denies classic heartburn, continues pepcid   Does have dm gastroparesis as well   Several possible etiologies  For cardiology follow up tomorrow  May consider cardiac CT in setting of known CAD Cholesterol is controlled   Call back and Er precautions noted in detail today    Bmet and cbc today

## 2024-02-06 NOTE — Progress Notes (Signed)
 Subjective:    Patient ID: Valerie Ball, female    DOB: Mar 03, 1946, 78 y.o.   MRN: [id number]  HPI  Wt Readings from Last 3 Encounters:  02/06/24 142 lb 8 oz (64.6 kg)  01/10/24 141 lb 12.8 oz (64.3 kg)  08/16/23 145 lb 6.4 oz (66 kg)   26.06 kg/m  Vitals:   02/06/24 1404  BP: 128/70  Pulse: 80  Temp: 98.7 F (37.1 C)  SpO2: 97%     Pt presents for follow up of hospitalization  11/12 to 01/31/24 For chest pain (5 days)  Per pt her usual left chest discomfort traveled across and to the neck   Reassuring EKG and troponins in ER Seen by cardiology  Noted low plt ct  Echo with normal EF grade 2 DD and no regional wall motion abnormalities ACS ruled out  Cardiology cleared for discharge  Plan to follow up outpt and consider cardiac CT  (cardiology follow up is 11/20) Amlodipine  increase to 5 mg daily  Metoprolol  dec to 12.5 mg daily   Lab Results  Component Value Date   NA 140 01/31/2024   K 3.5 01/31/2024   CO2 24 01/31/2024   GLUCOSE 94 01/31/2024   BUN 16 01/31/2024   CREATININE 1.02 (H) 01/31/2024   CALCIUM  9.0 01/31/2024   GFR 46.38 (L) 05/04/2022   EGFR 54 (L) 01/10/2024   GFRNONAA 57 (L) 01/31/2024   Lab Results  Component Value Date   ALT 11 01/10/2024   AST 21 01/10/2024   ALKPHOS 81 01/10/2024   BILITOT 0.7 01/10/2024   Lab Results  Component Value Date   WBC 4.6 01/31/2024   HGB 12.3 01/31/2024   HCT 37.0 01/31/2024   MCV 88.5 01/31/2024   PLT 98 (L) 01/31/2024   Does bruise easily   In general appetite is lower than it used to be  Does still take trulicity    Jardiance  may increase her skin/yeast    Lab Results  Component Value Date   TSH 1.36 05/08/2023   Lab Results  Component Value Date   CHOL 75 02/01/2024   HDL 30 (L) 02/01/2024   LDLCALC 18 02/01/2024   LDLDIRECT 55.0 05/04/2022   TRIG 135 02/01/2024   CHOLHDL 2.5 02/01/2024    HTN bp is stable today  No cp or palpitations or headaches or edema  No side  effects to medicines  BP Readings from Last 3 Encounters:  02/06/24 128/70  02/01/24 115/66  01/10/24 112/62     Lab Results  Component Value Date   NA 140 01/31/2024   K 3.5 01/31/2024   CO2 24 01/31/2024   GLUCOSE 94 01/31/2024   BUN 16 01/31/2024   CREATININE 1.02 (H) 01/31/2024   CALCIUM  9.0 01/31/2024   GFR 46.38 (L) 05/04/2022   EGFR 54 (L) 01/10/2024   GFRNONAA 57 (L) 01/31/2024   Amlodipine  5 mg daily  Metoprolol  xl 12.5 mg daily Imdur  30 mg daily  Losartan  25 mg daily    Today is tired  Back to her baseline twinge in left chest (thinks it may date back to mastectomy)     Patient Active Problem List   Diagnosis Date Noted   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   Heart murmur 01/31/2024   Atherosclerotic heart disease 01/31/2024   Mild aortic regurgitation prior TTE November 2024 01/10/2024   Seizure disorder [facility])    Arthritis    Leg pain, bilateral 07/25/2023   Eye pain 03/23/2023   History of CAD (coronary  artery disease) 03/23/2023   Chronic idiopathic thrombocytopenia (HCC) 03/23/2023   Mitral stenosis and aortic incompetence, by TTE 01/25/2023 02/01/2023   CAD (coronary artery disease)  CT Cardiac 01/19/2023 calcium  score 411, plaque volume 366, moderate stenosis LAD, mild stenosis D1, RCA, LCx/OM 3; S/p PCI of Mid LAD 03/08/2023;rpt cath 03/27/2023 stable 02/01/2023   Poor balance 01/30/2023   Dyspnea on exertion 01/04/2023   Palpitations 01/04/2023   Allergy    Angiodysplasia of colon    Anxiety    Arthritis of both knees    Borderline diabetic    Colon polyp    Diverticulosis    Hepatic steatosis    Hiatal hernia    Hyperlipidemia    Benign hypertension    Macular edema    Menopause    Postmenopausal hormone therapy    Seizures (HCC)    Chest pain 12/29/2022   Bilateral knee pain 05/11/2022   Decreased calculated GFR 05/11/2022   Blurred vision, right eye 08/11/2021   History of COVID-19 09/24/2020   Posterior vitreous detachment of right  eye 11/06/2019   Cystoid macular edema of left eye 07/14/2019   Branch retinal vein occlusion with macular edema of left eye (HCC) 07/14/2019   Hypertensive retinopathy of left eye 07/14/2019   Vitreous floaters of left eye 07/14/2019   Fatigue 05/20/2018   Hip pain, bilateral 08/31/2017   Vitamin B12 deficiency 09/21/2016   Diabetic gastroparesis (HCC) 03/27/2016   Retinal vein occlusion, branch (HCC) 01/03/2016   Alternating constipation and diarrhea 08/12/2015   AVM (arteriovenous malformation) of colon 05/18/2015   IBS (irritable bowel syndrome) 02/28/2015   Diverticulitis of colon 10/14/2014   Gout 07/22/2014   Hypothyroidism 04/23/2014   Encounter for Medicare annual wellness exam 01/23/2014   Estrogen deficiency 01/23/2014   Colon cancer screening 01/23/2014   Tremor 01/20/2014   Dizziness 01/07/2014   Allergic rhinitis 01/07/2014   Thrombocytopenia 11/25/2013   Diabetic neuropathy (HCC) 11/25/2013   History of removal of implants of both breasts 07/01/2013   Lap Nissen October 2013 01/19/2012   Large type III mixed hiatus hernia  12/06/2011   Stress reaction 10/25/2011   Routine general medical examination at a health care facility 07/11/2011   Non-insulin  treated type 2 diabetes mellitus (HCC) 07/25/2010   TRANSAMINASES, SERUM, ELEVATED 11/11/2009   History of breast cancer 08/02/2009   Breast cancer (HCC) 02/2009   ESOPHAGEAL STRICTURE 03/02/2008   Diaphragmatic hernia 03/02/2008   Peritoneal adhesions 03/02/2008   Anemia 02/07/2008   Melena 01/2008   Poorly controlled type 2 diabetes mellitus with neuropathy (HCC) 08/19/2007   GAD (generalized anxiety disorder) 04/15/2007   DISORDERS, ORGANIC SLEEP NEC 08/23/2006   SYNDROME, RESTLESS LEGS 08/23/2006   SYNDROME, CARPAL TUNNEL 08/23/2006   Hyperlipidemia associated with type 2 diabetes mellitus (HCC) 08/22/2006   Essential hypertension 08/22/2006   Allergic rhinitis 08/22/2006   Fatty liver 08/22/2006   History  of seizure 08/22/2006   Colon stricture (HCC) 01/14/2003   Past Medical History:  Diagnosis Date   Allergic rhinitis 08/22/2006   Qualifier: Diagnosis of   By: Cecilie CMA, NANNIE Ivy      IMO SNOMED Dx Update Oct 2024     Allergy    Alternating constipation and diarrhea 08/12/2015   Anemia    Angiodysplasia of colon    Anxiety    Arthritis    Atherosclerotic heart disease 01/31/2024   AVM (arteriovenous malformation) of colon    Benign hypertension    Bilateral knee pain 05/11/2022  Blood transfusion without reported diagnosis    Blurred vision, right eye 08/11/2021   Patient does have blurred vision right eye,     Borderline diabetic    per patient medical history form   Branch retinal vein occlusion with macular edema of left eye (HCC) 07/14/2019   CME superior to the fovea OS has worsened at 7-week interval from macular branch retinal vein occlusion, repeat Avastin  today and evaluate exam in 6 weeks     Breast cancer (HCC) 02/2009   breast CA L invasive ductal CA(hormone receptor positive.)   CAD (coronary artery disease)  CT Cardiac 01/19/2023 calcium  score 411, plaque volume 366, moderate stenosis LAD, mild stenosis D1, RCA, LCx/OM 3 02/01/2023   Cataract    Chest pain 12/29/2022   Chronic idiopathic thrombocytopenia (HCC) 03/23/2023   Chronic kidney disease    Colon cancer screening 01/23/2014   Colon polyp    hyperplastic   Colon stricture (HCC) 01/14/2003   Cystoid macular edema of left eye 07/14/2019   Decreased calculated GFR 05/11/2022   Depression    Diabetic gastroparesis (HCC) 03/27/2016   Typical symptoms -suspect      No detectable diabetic retinopathy 12-27-2020     Diabetic neuropathy (HCC) 11/25/2013   Diaphragmatic hernia 03/02/2008   Qualifier: Diagnosis of   By: Genie CMA LEODIS), Leisha      IMO SNOMED Dx Update Oct 2024     Diverticulitis of colon 10/14/2014   Diverticulosis    Dizziness 01/07/2014   Dyspnea on exertion 01/04/2023   Encounter  for Medicare annual wellness exam 01/23/2014   ESOPHAGEAL STRICTURE 03/02/2008   Qualifier: Diagnosis of   By: Genie CMA (AAMA), Chick         Essential hypertension 08/22/2006   Qualifier: Diagnosis of   By: Cecilie CMA, NANNIE Ivy         Estrogen deficiency 01/23/2014   Eye pain 03/23/2023   Fatigue 05/20/2018   Fatty liver    Fever, low grade 09/24/2020   GAD (generalized anxiety disorder) 04/15/2007   Qualifier: Diagnosis of   By: Baird FNP, Frieda Kipper        GERD (gastroesophageal reflux disease)    GI bleed    Gout    Heart murmur    Hepatic steatosis    Hiatal hernia    Hip pain, bilateral 08/31/2017   History of breast cancer 08/02/2009   Qualifier: Diagnosis of   By: Randeen MD, Laine Caldron        History of CAD (coronary artery disease) 03/23/2023   History of COVID-19 09/24/2020   History of removal of implants of both breasts 07/01/2013   06/25/13 elected to have bilateral breast implants removed.     History of seizure 08/22/2006   Qualifier: Diagnosis of   By: Cecilie CMA, NANNIE Ivy         Hyperlipidemia    Hyperlipidemia associated with type 2 diabetes mellitus (HCC) 08/22/2006   Qualifier: Diagnosis of   By: Cecilie CMA, NANNIE Ivy         Hypertension    Hypertensive retinopathy of left eye 07/14/2019   Hypothyroidism    IBS (irritable bowel syndrome)    Lap Nissen October 2013 01/19/2012   Laparoscopic takedown of large type III mixed hiatus hernia with primary closure of the diaphragm with 3 sutures posteriorally (post 2 with pledgets) and Nissen fundoplication over a #56 lighted bougie        Large type III mixed hiatus hernia  12/06/2011   Plan surgery to repair diaphragm and do Nissen fundoplication     Macular edema    left eye   Melena 01/2008   anemia transfusion   Menopause    per patient medical history form   Mild aortic regurgitation prior TTE November 2024 01/10/2024   Mitral stenosis and aortic incompetence, by TTE 01/25/2023  02/01/2023   Myocardial infarction [facility])    Non-insulin  treated type 2 diabetes mellitus (HCC) 07/25/2010   Ask patient to clarify. Type not noted on medical history form.     Palpitations 01/04/2023   Peritoneal adhesions 03/02/2008   Qualifier: Diagnosis of   By: Genie CMA LEODIS), Chick      IMO SNOMED Dx Update Oct 2024     Pharyngitis 02/06/2024   Poor balance 01/30/2023   Poorly controlled type 2 diabetes mellitus with neuropathy (HCC) 08/19/2007   Qualifier: Diagnosis of   By: Randeen MD, Laine Caldron        Posterior vitreous detachment of right eye 11/06/2019   Postmenopausal hormone therapy    per patient medical history form.   Retinal vein occlusion, branch (HCC) 01/03/2016   Left with hemorrhage and edema (Dr Elner) 2017     Routine general medical examination at a health care facility 07/11/2011   Salmonella 05/2000   renal effects secondary to dehydration   Seizure disorder (HCC)    Seizures (HCC)    one seizure several yrs ago --etiology unknown-no problem since   Stress reaction 10/25/2011   SYNDROME, CARPAL TUNNEL 08/23/2006   Qualifier: Diagnosis of   By: Randeen MD, Laine Caldron        SYNDROME, RESTLESS LEGS 08/23/2006   Qualifier: Diagnosis of   By: Randeen MD, Laine Caldron        Thrombocytopenia 11/25/2013   TRANSAMINASES, SERUM, ELEVATED 11/11/2009   Qualifier: Diagnosis of   By: Randeen MD, Laine Caldron        Tremor 01/20/2014   Head and right hand      Vitamin B12 deficiency 09/21/2016   Vitreous floaters of left eye 07/14/2019   The nature of vitreous floaters was discussed with the patient as well as their frequent occurrence with development of posterior vitreous detachment. The significance of flashes and field cuts was discussed with the patient. The need for a dilated fundus examination was discussed and advice given to return immediately for new or different floaters .  More uncommonly, vitreous floaters, debris, or   Past Surgical History:  Procedure Laterality  Date   ABDOMINAL HYSTERECTOMY  1983   BREAST SURGERY  02/2009   breast biopsy invasive ductal CA-bilateral mastectomies   CORONARY PRESSURE/FFR STUDY N/A 03/27/2023   Procedure: CORONARY PRESSURE/FFR STUDY;  Surgeon: Wendel Lurena POUR, MD;  Location: [facility];  Service: Cardiovascular;  Laterality: N/A;   CORONARY STENT INTERVENTION N/A 03/08/2023   Procedure: CORONARY STENT INTERVENTION;  Surgeon: Verlin Lonni BIRCH, MD;  Location: [facility];  Service: Cardiovascular;  Laterality: N/A;   COSMETIC SURGERY     EPIGASTRIC HERNIA REPAIR  01/02/2012   Procedure: HERNIA REPAIR EPIGASTRIC ADULT;  Surgeon: Donnice KATHEE Lunger, MD;  Location: [facility];  Service: General;  Laterality: N/A;  Laparoscopic Repair Paraesophageal Hernia, Nissen   EYE SURGERY Left 07/2017   Dr. Elner   GANGLION CYST EXCISION     HERNIA REPAIR     Repair did not work.  It came undone.   LAPAROSCOPIC NISSEN FUNDOPLICATION  01/02/2012   Procedure:  LAPAROSCOPIC NISSEN FUNDOPLICATION;  Surgeon: Donnice KATHEE Lunger, MD;  Location: [facility];  Service: General;  Laterality: N/A;   LEFT HEART CATH AND CORONARY ANGIOGRAPHY N/A 03/08/2023   Procedure: LEFT HEART CATH AND CORONARY ANGIOGRAPHY;  Surgeon: Verlin Lonni BIRCH, MD;  Location: [facility];  Service: Cardiovascular;  Laterality: N/A;   LEFT HEART CATH AND CORONARY ANGIOGRAPHY N/A 03/27/2023   Procedure: LEFT HEART CATH AND CORONARY ANGIOGRAPHY;  Surgeon: Wendel Lurena POUR, MD;  Location: [facility];  Service: Cardiovascular;  Laterality: N/A;   MASTECTOMY  04/2009   Bilateral for ductal carcinoma on L   OVARY SURGERY  1986   ovarian tumor removed   TUBAL LIGATION     Social History   Tobacco Use   Smoking status: Never   Smokeless tobacco: Never  Vaping Use   Vaping status: Never Used  Substance Use Topics   Alcohol use: Yes    Comment: occasional   Drug use: Never   Family History  Problem Relation Age of Onset    Hypertension Mother    Stroke Mother    Breast cancer Mother    Cancer Mother    Heart attack Father    Diabetes Father    Esophageal cancer Father    Cancer Father    Heart disease Father    Breast cancer Sister        x 2 sisters   Diabetes Sister    Arthritis Sister    Cancer Sister    Heart disease Sister    Kidney disease Sister    Hypertension Sister    Breast cancer Sister    Arthritis Sister    Cancer Sister    Diabetes Sister    Hypertension Brother    Diabetes Brother    Arthritis Brother    Arthritis Sister    Hyperlipidemia Sister    Hypertension Sister    Kidney disease Sister    Allergies  Allergen Reactions   Arimidex [Anastrozole] Other (See Comments)    Joint pain   Glipizide  Other (See Comments)    Hypoglycemia    Metformin  And Related Other (See Comments)    diarrhea   Penicillins Hives and Rash   Current Outpatient Medications on File Prior to Visit  Medication Sig Dispense Refill   amLODipine  (NORVASC ) 5 MG tablet Take 1 tablet (5 mg total) by mouth daily. 30 tablet 1   aspirin  EC 81 MG tablet Take 1 tablet (81 mg total) by mouth daily. Swallow whole. (Patient taking differently: Take 81 mg by mouth at bedtime. Swallow whole.)     clopidogrel  (PLAVIX ) 75 MG tablet Take 1 tablet (75 mg total) by mouth daily. (Patient taking differently: Take 75 mg by mouth at bedtime.) 90 tablet 3   Dulaglutide  (TRULICITY ) 3 MG/0.5ML SOAJ INJECT 0.5 ML (3 MG) UNDER THE SKIN ONCE A WEEK 6 mL 3   empagliflozin  (JARDIANCE ) 25 MG TABS tablet TAKE 1 TABLET DAILY BEFORE BREAKFAST 90 tablet 1   ezetimibe  (ZETIA ) 10 MG tablet TAKE 1 TABLET DAILY (Patient taking differently: Take 10 mg by mouth at bedtime.) 90 tablet 3   famotidine  (PEPCID ) 20 MG tablet TAKE 1 TABLET TWICE A DAY 180 tablet 0   FLUoxetine  (PROZAC ) 40 MG capsule TAKE 1 CAPSULE DAILY (Patient taking differently: Take 40 mg by mouth at bedtime.) 90 capsule 0   fluticasone  (FLONASE ) 50 MCG/ACT nasal spray USE 2  SPRAYS IN EACH NOSTRIL DAILY AS NEEDED FOR ALLERGIES (Patient taking differently:  Place 2 sprays into both nostrils daily as needed for allergies.) 48 g 0   gabapentin  (NEURONTIN ) 100 MG capsule Take 2 capsules (200 mg total) by mouth at bedtime. 180 capsule 0   isosorbide  mononitrate (IMDUR ) 30 MG 24 hr tablet Take 1 tablet (30 mg total) by mouth daily. (Patient taking differently: Take 30 mg by mouth at bedtime.) 90 tablet 3   levothyroxine  (SYNTHROID ) 50 MCG tablet Take 1 tablet (50 mcg total) by mouth daily before breakfast. 90 tablet 3   losartan  (COZAAR ) 25 MG tablet Take 1 tablet (25 mg total) by mouth daily. (Patient taking differently: Take 25 mg by mouth at bedtime.) 90 tablet 1   metoprolol  succinate (TOPROL -XL) 25 MG 24 hr tablet Take 0.5 tablets (12.5 mg total) by mouth daily. 30 tablet 1   Multiple Vitamin (MULTIVITAMIN WITH MINERALS) TABS tablet Take 1 tablet by mouth at bedtime.     nitroGLYCERIN  (NITROSTAT ) 0.4 MG SL tablet Place 1 tablet (0.4 mg total) under the tongue every 5 (five) minutes as needed for chest pain. 25 tablet 1   repaglinide  (PRANDIN ) 1 MG tablet TAKE 1 TABLET DAILY BEFORE SUPPER (Patient taking differently: Take 1 mg by mouth at bedtime.) 90 tablet 1   rOPINIRole  (REQUIP ) 1 MG tablet TAKE 1 TABLET AT BEDTIME 90 tablet 3   rosuvastatin  (CRESTOR ) 20 MG tablet Take 1 tablet (20 mg total) by mouth daily. (Patient taking differently: Take 20 mg by mouth at bedtime.) 90 tablet 3   No current facility-administered medications on file prior to visit.    Review of Systems  Constitutional:  Negative for activity change, appetite change, fatigue, fever and unexpected weight change.  HENT:  Negative for congestion, ear pain, rhinorrhea, sinus pressure and sore throat.   Eyes:  Negative for pain, redness and visual disturbance.  Respiratory:  Negative for cough, shortness of breath and wheezing.   Cardiovascular:  Positive for chest pain. Negative for palpitations.        Chest (chest wall) pain left sided/ dull and constant No difference from baseline  Radiates into axilla   Gastrointestinal:  Negative for abdominal pain, blood in stool, constipation and diarrhea.  Endocrine: Negative for polydipsia and polyuria.  Genitourinary:  Negative for dysuria, frequency and urgency.  Musculoskeletal:  Negative for arthralgias, back pain and myalgias.  Skin:  Negative for pallor and rash.  Allergic/Immunologic: Negative for environmental allergies.  Neurological:  Negative for dizziness, syncope and headaches.  Hematological:  Negative for adenopathy. Does not bruise/bleed easily.  Psychiatric/Behavioral:  Negative for decreased concentration and dysphoric mood. The patient is not nervous/anxious.        Objective:   Physical Exam Constitutional:      General: She is not in acute distress.    Appearance: Normal appearance. She is well-developed and normal weight. She is not ill-appearing or diaphoretic.  HENT:     Head: Normocephalic and atraumatic.  Eyes:     Conjunctiva/sclera: Conjunctivae normal.     Pupils: Pupils are equal, round, and reactive to light.  Neck:     Thyroid : No thyromegaly.     Vascular: No carotid bruit or JVD.  Cardiovascular:     Rate and Rhythm: Normal rate and regular rhythm.     Pulses: Normal pulses.     Heart sounds: Murmur heard.     No gallop.  Pulmonary:     Effort: Pulmonary effort is normal. No respiratory distress.     Breath sounds: Normal breath sounds. No stridor.  No wheezing, rhonchi or rales.  Chest:     Chest wall: No tenderness.  Abdominal:     General: There is no distension or abdominal bruit.     Palpations: Abdomen is soft.  Genitourinary:    Comments: No masses/changes noted in left axilla  Musculoskeletal:     Cervical back: Normal range of motion and neck supple.     Right lower leg: No edema.     Left lower leg: No edema.  Lymphadenopathy:     Cervical: No cervical adenopathy.  Skin:     General: Skin is warm and dry.     Coloration: Skin is not pale.     Findings: No rash.  Neurological:     Mental Status: She is alert.     Coordination: Coordination normal.     Deep Tendon Reflexes: Reflexes are normal and symmetric. Reflexes normal.  Psychiatric:        Mood and Affect: Mood normal.           Assessment & Plan:   Problem List Items Addressed This Visit       Cardiovascular and Mediastinum   Essential hypertension   S/p hospitalization for chest pain  BP: 128/70  Amlodipine  up to 5 mg daily  Metoprolol  xl 12.5 (reduced) Losartan  25 mg daily  Imdur  30 mg at bedtime Stable Labs reviewed and ordered   Feeling better overall      Relevant Orders   Basic metabolic panel with GFR   CAD (coronary artery disease)  CT Cardiac 01/19/2023 calcium  score 411, plaque volume 366, moderate stenosis LAD, mild stenosis D1, RCA, LCx/OM 3; S/p PCI of Mid LAD 03/08/2023;rpt cath 03/27/2023 stable - Primary   Recent hospitalization for cp , ACS ruled out  Reviewed hospital records, lab results and studies in detail   Reviewed last cardiology note Amlodipine  and metoprolol  xl doses adjusted  Imdur  -continued   For cardiology follow up  May consider cardiac CT  Continues plavix  and asa -watching platelets         Digestive   Diabetic gastroparesis (HCC)   ? If adding to cp Pt thinks she tolerated trulicity  fairly         Hematopoietic and Hemostatic   Thrombocytopenia   Relevant Orders   CBC with Differential/Platelet     Other   Chest pain   Recently hosp for left chest pain  Reviewed hospital records, lab results and studies in detail  Ruled out for ACS Reassuring work up  Echo noted grade 2 DD  Normal vitals today  Cp is back to baseline (left in axilla-thinks due to mastectomy in past)  GI situation fairly stable Denies classic heartburn, continues pepcid   Does have dm gastroparesis as well   Several possible etiologies  For cardiology follow  up tomorrow  May consider cardiac CT in setting of known CAD Cholesterol is controlled   Call back and Er precautions noted in detail today    Bmet and cbc today

## 2024-02-06 NOTE — Assessment & Plan Note (Signed)
 Recent hospitalization for cp , ACS ruled out  Reviewed hospital records, lab results and studies in detail   Reviewed last cardiology note Amlodipine  and metoprolol  xl doses adjusted  Imdur  -continued   For cardiology follow up  May consider cardiac CT  Continues plavix  and asa -watching platelets

## 2024-02-06 NOTE — Assessment & Plan Note (Signed)
 S/p hospitalization for chest pain  BP: 128/70  Amlodipine  up to 5 mg daily  Metoprolol  xl 12.5 (reduced) Losartan  25 mg daily  Imdur  30 mg at bedtime Stable Labs reviewed and ordered   Feeling better overall

## 2024-02-06 NOTE — Patient Instructions (Signed)
 For itchy skin areas- keep clean and dry very well  You can use some monistat cream (for yeast) over the counter   If that does not help let your endocrinologist know  Schedule follow up when you can   Lab today for cbc and bmet    Keep us  posted  If chest symptoms change let us  know   Follow up with cardiology as planned

## 2024-02-06 NOTE — Assessment & Plan Note (Signed)
?   If adding to cp Pt thinks she tolerated trulicity  fairly

## 2024-02-07 ENCOUNTER — Ambulatory Visit: Payer: Self-pay | Admitting: Family Medicine

## 2024-02-07 ENCOUNTER — Ambulatory Visit

## 2024-02-07 VITALS — BP 110/60 | HR 74 | Ht 63.0 in | Wt 143.2 lb

## 2024-02-07 DIAGNOSIS — E785 Hyperlipidemia, unspecified: Secondary | ICD-10-CM | POA: Diagnosis not present

## 2024-02-07 DIAGNOSIS — I080 Rheumatic disorders of both mitral and aortic valves: Secondary | ICD-10-CM

## 2024-02-07 DIAGNOSIS — R079 Chest pain, unspecified: Secondary | ICD-10-CM | POA: Diagnosis not present

## 2024-02-07 DIAGNOSIS — I2510 Atherosclerotic heart disease of native coronary artery without angina pectoris: Secondary | ICD-10-CM | POA: Diagnosis not present

## 2024-02-07 DIAGNOSIS — E1169 Type 2 diabetes mellitus with other specified complication: Secondary | ICD-10-CM | POA: Diagnosis not present

## 2024-02-07 LAB — BASIC METABOLIC PANEL WITH GFR
BUN: 19 mg/dL (ref 6–23)
CO2: 25 meq/L (ref 19–32)
Calcium: 9.1 mg/dL (ref 8.4–10.5)
Chloride: 103 meq/L (ref 96–112)
Creatinine, Ser: 1.08 mg/dL (ref 0.40–1.20)
GFR: 49.91 mL/min — ABNORMAL LOW (ref >60.00)
Glucose, Bld: 234 mg/dL — ABNORMAL HIGH (ref 70–99)
Potassium: 3.7 meq/L (ref 3.5–5.1)
Sodium: 140 meq/L (ref 135–145)

## 2024-02-07 LAB — CBC WITH DIFFERENTIAL/PLATELET
Basophils Absolute: 0.1 K/uL (ref 0.0–0.1)
Basophils Relative: 0.9 % (ref 0.0–3.0)
Eosinophils Absolute: 0.1 K/uL (ref 0.0–0.7)
Eosinophils Relative: 1.3 % (ref 0.0–5.0)
HCT: 38.5 % (ref 36.0–46.0)
Hemoglobin: 12.7 g/dL (ref 12.0–15.0)
Lymphocytes Relative: 34.3 % (ref 12.0–46.0)
Lymphs Abs: 1.9 K/uL (ref 0.7–4.0)
MCHC: 33.1 g/dL (ref 30.0–36.0)
MCV: 87.7 fl (ref 78.0–100.0)
Monocytes Absolute: 0.4 K/uL (ref 0.1–1.0)
Monocytes Relative: 7.1 % (ref 3.0–12.0)
Neutro Abs: 3.1 K/uL (ref 1.4–7.7)
Neutrophils Relative %: 56.4 % (ref 43.0–77.0)
Platelets: 122 K/uL — ABNORMAL LOW (ref 150.0–400.0)
RBC: 4.39 Mil/uL (ref 3.87–5.11)
RDW: 15.8 % — ABNORMAL HIGH (ref 11.5–15.5)
WBC: 5.4 K/uL (ref 4.0–10.5)

## 2024-02-07 NOTE — Progress Notes (Addendum)
 Cardiology Consultation:    Date:  02/07/2024   ID:  ELTHA TINGLEY, DOB Oct 21, 1945, MRN [id number]  PCP:  Randeen Laine LABOR, MD  Cardiologist:  Alean SAUNDERS Soren Pigman, MD   Referring MD: Randeen Laine LABOR, MD   No chief complaint on file.    ASSESSMENT AND PLAN:   Ms. Peragine 78 year old woman with history of CAD cardiac CT November 2024 was abnormal, subsequently cardiac catheterization and PCI of mid LAD 03/08/2023, subsequently with chest pain was readmitted and redo cath on 03-27-2023 showed patent LAD stent and nonsignificant by RFR ostial RCA lesion and CFR of 1.8 suggested microvascular dysfunction with elevated LVEDP 25 mmHg ), repeat echocardiogram 01/31/2024 at [facility] images reviewed myself show normal biventricular function, LVH mild, MAC with mild mitral stenosis based on mean gradient 3 mmHg heart rate 70/min, mild aortic insufficiency, mild MR.  Diastolic dysfunction appears grade 1 on my review of images.  Also has history hypertension, hyperlipidemia, hypothyroidism, irritable bowel syndrome, hiatal hernia s/p unsuccessful repair, seizure disorder, chronic thrombocytopenia, gastroparesis, NASH, chronic dizziness, CKD stage III, chronic microvascular ischemic changes on CT head September 2024, hemorrhoids, palpitations without significant abnormalities on event monitor October 2024.     Problem List Items Addressed This Visit     Hyperlipidemia associated with type 2 diabetes mellitus (HCC)   Tolerating her current medication rosuvastatin  20 mg once daily well. Also on Zetia  10 mg once daily. Continue same.   lipid panel from 02/01/2024 total cholesterol 75, triglycerides 135, HDL 30, LDL 18. Lipoprotein a was 16.6.       Mitral stenosis and aortic incompetence, by TTE 01/25/2023   Reviewed recent echocardiogram findings. Stable results. Will continue to follow-up.      CAD (coronary artery disease)  CT Cardiac 01/19/2023 calcium  score 411, plaque volume 366,  moderate stenosis LAD, mild stenosis D1, RCA, LCx/OM 3; S/p PCI of Mid LAD 03/08/2023;rpt cath 03/27/2023 stable   After recent admission and at [facility] for chest pain evaluation, was unremarkable, no significant EKG changes, negative troponins, no new wall motion abnormalities on echocardiogram. Here for follow-up visit.   Atypical symptoms.  Continue dual antiplatelet therapy with aspirin  and Plavix . Continue antianginal regimen with amlodipine  5 mg once daily Metoprolol  XL 12.5 mg once daily Imdur  30 mg once daily. Blood pressure and heart rate limits further titration up of the doses.  Will proceed with further evaluation using cardiac PET stress test to rule out any significant ischemia. Alternate options of proceeding with cardiac catheterization reviewed however given her chronic kidney disease history and relative thrombocytopenia, we will hold off on invasive testing if possible.  If she is having any significant interval symptoms worsening advised to go to the ER right away.       Relevant Orders   Cardiac Stress Test: Informed Consent Details: Physician/Practitioner Attestation; Transcribe to consent form and obtain patient signature   Other Visit Diagnoses       Chest pain of uncertain etiology    -  Primary   Relevant Orders   NM PET CT CARDIAC PERFUSION MULTI W/ABSOLUTE BLOODFLOW   Cardiac Stress Test: Informed Consent Details: Physician/Practitioner Attestation; Transcribe to consent form and obtain patient signature      Return to clinic tentatively in 3 months.   Addendum 02/18/2024: Her stress test request was declined by the [employer]. She continues to have ongoing symptoms of chest pain and reached out to our office. Although her symptoms are somewhat atypical given her  underlying history of CAD and risk factors and given her ongoing symptoms of chest pain I strongly believe further evaluation for any obstructive CAD or ischemia is warranted.  I  will request order for a Lexiscan stress test with nuclear imaging to be placed. If this is declined,  she will have to go to the ER for further evaluation.     History of Present Illness:    SANDIA PFUND is a 78 y.o. female who is being seen today for follow-up visit. PCP is Tower, Laine LABOR, MD. Last visit with me in the office was 01/10/2024.  Here for an earlier follow-up visit after recent admission to the hospital 01/30/2024 for evaluation of chest pain, unremarkable troponins and EKG and stable echocardiogram findings.  Referred for follow-up as outpatient  history of CAD cardiac CT November 2024 was abnormal, subsequently cardiac catheterization and PCI of mid LAD 03/08/2023, subsequently with chest pain was readmitted and redo cath on 03-27-2023 showed patent LAD stent and nonsignificant by RFR ostial RCA lesion and CFR of 1.8 suggested microvascular dysfunction with elevated LVEDP 25 mmHg ), repeat echocardiogram 01/31/2024 at [facility] images reviewed myself show normal biventricular function, LVH mild, MAC with mild mitral stenosis based on mean gradient 3 mmHg heart rate 70/min, mild aortic insufficiency, mild MR.  Diastolic dysfunction appears grade 1 on my review of images.  Also has history hypertension, hyperlipidemia, hypothyroidism, irritable bowel syndrome, hiatal hernia s/p unsuccessful repair, seizure disorder, chronic thrombocytopenia, gastroparesis, NASH, chronic dizziness, CKD stage III, chronic microvascular ischemic changes on CT head September 2024, hemorrhoids, palpitations without significant abnormalities on event monitor October 2024.  Reports she has had symptoms of mild chest discomfort on the right side of the chest radiating to the right side of the jaw which prompted going to the ER at [facility], her husband drove her to the ER. EKG was unremarkable.  High-sensitivity troponin was unremarkable 5 and 5.  Her symptoms subsided while in the  hospital.  Repeat echocardiogram images reviewed with normal biventricular function, grade 1 diastolic dysfunction, mild aortic insufficiency, mild mitral stenosis [mean gradient 3 mmHg at heart rate 70/min], mild MR. She was discharged home with higher dose of amlodipine  5 mg once daily.  Here for follow-up visit mentions she has been feeling better.  Has not had further recurrent symptoms. She continues to report feeling tired. No orthopnea or paroxysmal nocturnal dyspnea.  No pedal edema.  No bleeding concerns.  Recent lipid panel from 02/01/2024 total cholesterol 75, triglycerides 135, HDL 30, LDL 18. Lipoprotein a was 16.6.   Past Medical History:  Diagnosis Date   Allergic rhinitis 08/22/2006   Qualifier: Diagnosis of   By: Cecilie CMA, NANNIE Ivy      IMO SNOMED Dx Update Oct 2024     Allergy    Alternating constipation and diarrhea 08/12/2015   Anemia    Angiodysplasia of colon    Anxiety    Arthritis    Atherosclerotic heart disease 01/31/2024   AVM (arteriovenous malformation) of colon    Benign hypertension    Bilateral knee pain 05/11/2022   Blood transfusion without reported diagnosis    Blurred vision, right eye 08/11/2021   Patient does have blurred vision right eye,     Borderline diabetic    per patient medical history form   Branch retinal vein occlusion with macular edema of left eye (HCC) 07/14/2019   CME superior to the fovea OS has worsened at 7-week interval from  macular branch retinal vein occlusion, repeat Avastin  today and evaluate exam in 6 weeks     Breast cancer (HCC) 02/2009   breast CA L invasive ductal CA(hormone receptor positive.)   CAD (coronary artery disease)  CT Cardiac 01/19/2023 calcium  score 411, plaque volume 366, moderate stenosis LAD, mild stenosis D1, RCA, LCx/OM 3 02/01/2023   Cataract    Chest pain 12/29/2022   Chronic idiopathic thrombocytopenia (HCC) 03/23/2023   Chronic kidney disease    Colon cancer screening 01/23/2014    Colon polyp    hyperplastic   Colon stricture (HCC) 01/14/2003   Cystoid macular edema of left eye 07/14/2019   Decreased calculated GFR 05/11/2022   Depression    Diabetic gastroparesis (HCC) 03/27/2016   Typical symptoms -suspect      No detectable diabetic retinopathy 12-27-2020     Diabetic neuropathy (HCC) 11/25/2013   Diaphragmatic hernia 03/02/2008   Qualifier: Diagnosis of   By: Genie CMA LEODIS), Leisha      IMO SNOMED Dx Update Oct 2024     Diverticulitis of colon 10/14/2014   Diverticulosis    Dizziness 01/07/2014   Dyspnea on exertion 01/04/2023   Encounter for Medicare annual wellness exam 01/23/2014   ESOPHAGEAL STRICTURE 03/02/2008   Qualifier: Diagnosis of   By: Genie CMA (AAMA), Chick         Essential hypertension 08/22/2006   Qualifier: Diagnosis of   By: Cecilie CMA, NANNIE Ivy         Estrogen deficiency 01/23/2014   Eye pain 03/23/2023   Fatigue 05/20/2018   Fatty liver    Fever, low grade 09/24/2020   GAD (generalized anxiety disorder) 04/15/2007   Qualifier: Diagnosis of   By: Baird FNP, Frieda Kipper        GERD (gastroesophageal reflux disease)    GI bleed    Gout    Heart murmur    Hepatic steatosis    Hiatal hernia    Hip pain, bilateral 08/31/2017   History of breast cancer 08/02/2009   Qualifier: Diagnosis of   By: Randeen MD, Laine Caldron        History of CAD (coronary artery disease) 03/23/2023   History of COVID-19 09/24/2020   History of removal of implants of both breasts 07/01/2013   06/25/13 elected to have bilateral breast implants removed.     History of seizure 08/22/2006   Qualifier: Diagnosis of   By: Cecilie CMA, NANNIE Ivy         Hyperlipidemia    Hyperlipidemia associated with type 2 diabetes mellitus (HCC) 08/22/2006   Qualifier: Diagnosis of   By: Cecilie CMA, NANNIE Ivy         Hypertension    Hypertensive retinopathy of left eye 07/14/2019   Hypothyroidism    IBS (irritable bowel syndrome)    Lap Nissen October  2013 01/19/2012   Laparoscopic takedown of large type III mixed hiatus hernia with primary closure of the diaphragm with 3 sutures posteriorally (post 2 with pledgets) and Nissen fundoplication over a #56 lighted bougie        Large type III mixed hiatus hernia  12/06/2011   Plan surgery to repair diaphragm and do Nissen fundoplication     Macular edema    left eye   Melena 01/2008   anemia transfusion   Menopause    per patient medical history form   Mild aortic regurgitation prior TTE November 2024 01/10/2024   Mitral stenosis and aortic incompetence, by TTE 01/25/2023 02/01/2023  Myocardial infarction [facility])    Non-insulin  treated type 2 diabetes mellitus (HCC) 07/25/2010   Ask patient to clarify. Type not noted on medical history form.     Palpitations 01/04/2023   Peritoneal adhesions 03/02/2008   Qualifier: Diagnosis of   By: Genie CMA LEODIS), Chick      IMO SNOMED Dx Update Oct 2024     Pharyngitis 02/06/2024   Poor balance 01/30/2023   Poorly controlled type 2 diabetes mellitus with neuropathy (HCC) 08/19/2007   Qualifier: Diagnosis of   By: Randeen MD, Laine Caldron        Posterior vitreous detachment of right eye 11/06/2019   Postmenopausal hormone therapy    per patient medical history form.   Retinal vein occlusion, branch (HCC) 01/03/2016   Left with hemorrhage and edema (Dr Elner) 2017     Routine general medical examination at a health care facility 07/11/2011   Salmonella 05/2000   renal effects secondary to dehydration   Seizure disorder (HCC)    Seizures (HCC)    one seizure several yrs ago --etiology unknown-no problem since   Stress reaction 10/25/2011   SYNDROME, CARPAL TUNNEL 08/23/2006   Qualifier: Diagnosis of   By: Randeen MD, Laine Caldron        SYNDROME, RESTLESS LEGS 08/23/2006   Qualifier: Diagnosis of   By: Randeen MD, Laine Caldron        Thrombocytopenia 11/25/2013   TRANSAMINASES, SERUM, ELEVATED 11/11/2009   Qualifier: Diagnosis of   By: Randeen MD, Laine Caldron        Tremor 01/20/2014   Head and right hand      Vitamin B12 deficiency 09/21/2016   Vitreous floaters of left eye 07/14/2019   The nature of vitreous floaters was discussed with the patient as well as their frequent occurrence with development of posterior vitreous detachment. The significance of flashes and field cuts was discussed with the patient. The need for a dilated fundus examination was discussed and advice given to return immediately for new or different floaters .  More uncommonly, vitreous floaters, debris, or    Past Surgical History:  Procedure Laterality Date   ABDOMINAL HYSTERECTOMY  1983   BREAST SURGERY  02/2009   breast biopsy invasive ductal CA-bilateral mastectomies   CORONARY PRESSURE/FFR STUDY N/A 03/27/2023   Procedure: CORONARY PRESSURE/FFR STUDY;  Surgeon: Wendel Lurena POUR, MD;  Location: [facility];  Service: Cardiovascular;  Laterality: N/A;   CORONARY STENT INTERVENTION N/A 03/08/2023   Procedure: CORONARY STENT INTERVENTION;  Surgeon: Verlin Lonni BIRCH, MD;  Location: [facility];  Service: Cardiovascular;  Laterality: N/A;   COSMETIC SURGERY     EPIGASTRIC HERNIA REPAIR  01/02/2012   Procedure: HERNIA REPAIR EPIGASTRIC ADULT;  Surgeon: Donnice KATHEE Lunger, MD;  Location: [facility];  Service: General;  Laterality: N/A;  Laparoscopic Repair Paraesophageal Hernia, Nissen   EYE SURGERY Left 07/2017   Dr. Elner   GANGLION CYST EXCISION     HERNIA REPAIR     Repair did not work.  It came undone.   LAPAROSCOPIC NISSEN FUNDOPLICATION  01/02/2012   Procedure: LAPAROSCOPIC NISSEN FUNDOPLICATION;  Surgeon: Donnice KATHEE Lunger, MD;  Location: [facility];  Service: General;  Laterality: N/A;   LEFT HEART CATH AND CORONARY ANGIOGRAPHY N/A 03/08/2023   Procedure: LEFT HEART CATH AND CORONARY ANGIOGRAPHY;  Surgeon: Verlin Lonni BIRCH, MD;  Location: [facility];  Service: Cardiovascular;  Laterality: N/A;   LEFT HEART CATH AND CORONARY  ANGIOGRAPHY  N/A 03/27/2023   Procedure: LEFT HEART CATH AND CORONARY ANGIOGRAPHY;  Surgeon: Wendel Lurena POUR, MD;  Location: [facility];  Service: Cardiovascular;  Laterality: N/A;   MASTECTOMY  04/2009   Bilateral for ductal carcinoma on L   OVARY SURGERY  1986   ovarian tumor removed   TUBAL LIGATION      Current Medications: Current Meds  Medication Sig   amLODipine  (NORVASC ) 5 MG tablet Take 1 tablet (5 mg total) by mouth daily.   aspirin  EC 81 MG tablet Take 1 tablet (81 mg total) by mouth daily. Swallow whole. (Patient taking differently: Take 81 mg by mouth at bedtime. Swallow whole.)   clopidogrel  (PLAVIX ) 75 MG tablet Take 1 tablet (75 mg total) by mouth daily. (Patient taking differently: Take 75 mg by mouth at bedtime.)   Dulaglutide  (TRULICITY ) 3 MG/0.5ML SOAJ INJECT 0.5 ML (3 MG) UNDER THE SKIN ONCE A WEEK   empagliflozin  (JARDIANCE ) 25 MG TABS tablet TAKE 1 TABLET DAILY BEFORE BREAKFAST   ezetimibe  (ZETIA ) 10 MG tablet TAKE 1 TABLET DAILY (Patient taking differently: Take 10 mg by mouth at bedtime.)   famotidine  (PEPCID ) 20 MG tablet TAKE 1 TABLET TWICE A DAY   FLUoxetine  (PROZAC ) 40 MG capsule TAKE 1 CAPSULE DAILY (Patient taking differently: Take 40 mg by mouth at bedtime.)   fluticasone  (FLONASE ) 50 MCG/ACT nasal spray USE 2 SPRAYS IN EACH NOSTRIL DAILY AS NEEDED FOR ALLERGIES (Patient taking differently: Place 2 sprays into both nostrils daily as needed for allergies.)   gabapentin  (NEURONTIN ) 100 MG capsule Take 2 capsules (200 mg total) by mouth at bedtime.   isosorbide  mononitrate (IMDUR ) 30 MG 24 hr tablet Take 1 tablet (30 mg total) by mouth daily. (Patient taking differently: Take 30 mg by mouth at bedtime.)   levothyroxine  (SYNTHROID ) 50 MCG tablet Take 1 tablet (50 mcg total) by mouth daily before breakfast.   losartan  (COZAAR ) 25 MG tablet Take 1 tablet (25 mg total) by mouth daily. (Patient taking differently: Take 25 mg by mouth at bedtime.)   metoprolol   succinate (TOPROL -XL) 25 MG 24 hr tablet Take 0.5 tablets (12.5 mg total) by mouth daily.   Multiple Vitamin (MULTIVITAMIN WITH MINERALS) TABS tablet Take 1 tablet by mouth at bedtime.   nitroGLYCERIN  (NITROSTAT ) 0.4 MG SL tablet Place 1 tablet (0.4 mg total) under the tongue every 5 (five) minutes as needed for chest pain.   repaglinide  (PRANDIN ) 1 MG tablet TAKE 1 TABLET DAILY BEFORE SUPPER (Patient taking differently: Take 1 mg by mouth at bedtime.)   rOPINIRole  (REQUIP ) 1 MG tablet TAKE 1 TABLET AT BEDTIME   rosuvastatin  (CRESTOR ) 20 MG tablet Take 1 tablet (20 mg total) by mouth daily. (Patient taking differently: Take 20 mg by mouth at bedtime.)     Allergies:   Arimidex [anastrozole], Glipizide , Metformin  and related, and Penicillins   Social History   Socioeconomic History   Marital status: Married    Spouse name: Not on file   Number of children: 3   Years of education: Not on file   Highest education level: Not on file  Occupational History   Occupation: retired    Associate Professor: UNEMPLOYED  Tobacco Use   Smoking status: Never   Smokeless tobacco: Never  Vaping Use   Vaping status: Never Used  Substance and Sexual Activity   Alcohol use: Yes    Comment: occasional   Drug use: Never   Sexual activity: Not Currently  Other Topics Concern   Not on  file  Social History Narrative   ** Merged History Encounter **       1 cup of coffee every morning   Social Drivers of Health   Financial Resource Strain: Low Risk  (02/28/2023)   Overall Financial Resource Strain (CARDIA)    Difficulty of Paying Living Expenses: Not hard at all  Food Insecurity: No Food Insecurity (01/31/2024)   Hunger Vital Sign    Worried About Running Out of Food in the Last Year: Never true    Ran Out of Food in the Last Year: Never true  Transportation Needs: No Transportation Needs (01/31/2024)   PRAPARE - Administrator, Civil Service (Medical): No    Lack of Transportation  (Non-Medical): No  Physical Activity: Inactive (02/28/2023)   Exercise Vital Sign    Days of Exercise per Week: 0 days    Minutes of Exercise per Session: 0 min  Stress: Stress Concern Present (02/28/2023)   [employer] of Occupational Health - Occupational Stress Questionnaire    Feeling of Stress : Rather much  Social Connections: Socially Integrated (01/31/2024)   Social Connection and Isolation Panel    Frequency of Communication with Friends and Family: Twice a week    Frequency of Social Gatherings with Friends and Family: Once a week    Attends Religious Services: More than 4 times per year    Active Member of [employer] or Organizations: Yes    Attends Engineer, Structural: 1 to 4 times per year    Marital Status: Married     Family History: The patient's family history includes Arthritis in her brother, sister, sister, and sister; Breast cancer in her mother, sister, and sister; Cancer in her father, mother, sister, and sister; Diabetes in her brother, father, sister, and sister; Esophageal cancer in her father; Heart attack in her father; Heart disease in her father and sister; Hyperlipidemia in her sister; Hypertension in her brother, mother, sister, and sister; Kidney disease in her sister and sister; Stroke in her mother. ROS:   Please see the history of present illness.    All 14 point review of systems negative except as described per history of present illness.  EKGs/Labs/Other Studies Reviewed:    The following studies were reviewed today:   EKG:       Recent Labs: 03/24/2023: B Natriuretic Peptide 43.0 05/08/2023: TSH 1.36 01/10/2024: ALT 11 01/31/2024: BUN 16; Creatinine, Ser 1.02; Hemoglobin 12.3; Magnesium 1.9; Platelets 98; Potassium 3.5; Sodium 140  Recent Lipid Panel    Component Value Date/Time   CHOL 75 02/01/2024 0455   TRIG 135 02/01/2024 0455   HDL 30 (L) 02/01/2024 0455   CHOLHDL 2.5 02/01/2024 0455   VLDL 27 02/01/2024 0455   LDLCALC  18 02/01/2024 0455   LDLDIRECT 55.0 05/04/2022 0909    Physical Exam:    VS:  BP 110/60   Pulse 74   Ht 5' 3 (1.6 m)   Wt 143 lb 3.2 oz (65 kg)   LMP 03/20/1981   SpO2 97%   BMI 25.37 kg/m     Wt Readings from Last 3 Encounters:  02/07/24 143 lb 3.2 oz (65 kg)  02/06/24 142 lb 8 oz (64.6 kg)  01/10/24 141 lb 12.8 oz (64.3 kg)     GENERAL:  Well nourished, well developed in no acute distress NECK: No JVD; No carotid bruits CARDIAC: RRR, S1 and S2 present, no murmurs, no rubs, no gallops CHEST:  Clear to auscultation without rales, wheezing  or rhonchi  Extremities: No pitting pedal edema. Pulses bilaterally symmetric with radial 2+ and dorsalis pedis 2+ NEUROLOGIC:  Alert and oriented x 3  Medication Adjustments/Labs and Tests Ordered: Current medicines are reviewed at length with the patient today.  Concerns regarding medicines are outlined above.  Orders Placed This Encounter  Procedures   NM PET CT CARDIAC PERFUSION MULTI W/ABSOLUTE BLOODFLOW   Cardiac Stress Test: Informed Consent Details: Physician/Practitioner Attestation; Transcribe to consent form and obtain patient signature   No orders of the defined types were placed in this encounter.   Signed, Alean jess Kobus, MD, MPH, [facility]. 02/07/2024 8:49 AM    [facility]

## 2024-02-07 NOTE — Assessment & Plan Note (Signed)
 Tolerating her current medication rosuvastatin  20 mg once daily well. Also on Zetia  10 mg once daily. Continue same.   lipid panel from 02/01/2024 total cholesterol 75, triglycerides 135, HDL 30, LDL 18. Lipoprotein a was 16.6.

## 2024-02-07 NOTE — Assessment & Plan Note (Signed)
 After recent admission and at [facility] for chest pain evaluation, was unremarkable, no significant EKG changes, negative troponins, no new wall motion abnormalities on echocardiogram. Here for follow-up visit.   Atypical symptoms.  Continue dual antiplatelet therapy with aspirin  and Plavix . Continue antianginal regimen with amlodipine  5 mg once daily Metoprolol  XL 12.5 mg once daily Imdur  30 mg once daily. Blood pressure and heart rate limits further titration up of the doses.  Will proceed with further evaluation using cardiac PET stress test to rule out any significant ischemia. Alternate options of proceeding with cardiac catheterization reviewed however given her chronic kidney disease history and relative thrombocytopenia, we will hold off on invasive testing if possible.  If she is having any significant interval symptoms worsening advised to go to the ER right away.

## 2024-02-07 NOTE — Assessment & Plan Note (Signed)
 Reviewed recent echocardiogram findings. Stable results. Will continue to follow-up.

## 2024-02-07 NOTE — Patient Instructions (Signed)
 Medication Instructions:  Your physician recommends that you continue on your current medications as directed. Please refer to the Current Medication list given to you today.  *If you need a refill on your cardiac medications before your next appointment, please call your pharmacy*  Lab Work: None If you have labs (blood work) drawn today and your tests are completely normal, you will receive your results only by: MyChart Message (if you have MyChart) OR A paper copy in the mail If you have any lab test that is abnormal or we need to change your treatment, we will call you to review the results.  Testing/Procedures: How to Prepare for Your Cardiac PET/CT Stress Test:  1. Please do not take these medications before your test:   Medications that may interfere with the cardiac pharmacological stress agent (ex. nitrates - including erectile dysfunction medications, isosorbide  mononitrate- [please start to hold this medication the day before the test], tamulosin or beta-blockers) the day of the exam. (Erectile dysfunction medication should be held for at least 72 hrs prior to test) Theophylline containing medications for 12 hours. Dipyridamole 48 hours prior to the test. Your remaining medications may be taken with water.  2. Nothing to eat or drink, except water, 3 hours prior to arrival time.   NO caffeine/decaffeinated products, or chocolate 12 hours prior to arrival.  3. NO perfume, cologne or lotion on chest or abdomen area.          - FEMALES - Please avoid wearing dresses to this appointment.  4. Total time is 1 to 2 hours; you may want to bring reading material for the waiting time.  5. Please report to Radiology at the [facility] Main Entrance 30 minutes early for your test.  [address] [place], [place] [address]  6. Please report to Radiology at [facility] Main Entrance, medical mall, 30 mins prior to your test.  [address]  [place], [place]  [phone]  Diabetic Preparation:  Hold oral medications. You may take NPH and Lantus insulin . Do not take Humalog or Humulin R  (Regular Insulin ) the day of your test. Check blood sugars prior to leaving the house. If able to eat breakfast prior to 3 hour fasting, you may take all medications, including your insulin , Do not worry if you miss your breakfast dose of insulin  - start at your next meal. Patients who wear a continuous glucose monitor MUST remove the device prior to scanning.  IF YOU THINK YOU MAY BE PREGNANT, OR ARE NURSING PLEASE INFORM THE TECHNOLOGIST.  In preparation for your appointment, medication and supplies will be purchased.  Appointment availability is limited, so if you need to cancel or reschedule, please call the Radiology Department at [phone] Geroge Law) OR [phone] [employer])  24 hours in advance to avoid a cancellation fee of $100.00  What to Expect After you Arrive:  Once you arrive and check in for your appointment, you will be taken to a preparation room within the Radiology Department.  A technologist or Nurse will obtain your medical history, verify that you are correctly prepped for the exam, and explain the procedure.  Afterwards,  an IV will be started in your arm and electrodes will be placed on your skin for EKG monitoring during the stress portion of the exam. Then you will be escorted to the PET/CT scanner.  There, staff will get you positioned on the scanner and obtain a blood pressure and EKG.  During the  exam, you will continue to be connected to the EKG and blood pressure machines.  A small, safe amount of a radioactive tracer will be injected in your IV to obtain a series of pictures of your heart along with an injection of a stress agent.    After your Exam:  It is recommended that you eat a meal and drink a caffeinated beverage to counter act any effects of the stress agent.  Drink plenty of fluids for the remainder  of the day and urinate frequently for the first couple of hours after the exam.  Your doctor will inform you of your test results within 7-10 business days.  For more information and frequently asked questions, please visit our website : [email or web address]  For questions about your test or how to prepare for your test, please call: Cardiac Imaging Nurse Navigators Office: [phone]          Follow-Up: At [facility], you and your health needs are our priority.  As part of our continuing mission to provide you with exceptional heart care, our providers are all part of one team.  This team includes your primary Cardiologist (physician) and Advanced Practice Providers or APPs (Physician Assistants and Nurse Practitioners) who all work together to provide you with the care you need, when you need it.  Your next appointment:   3 month(s)  Provider:   Alean Kobus, MD    We recommend signing up for the patient portal called MyChart.  Sign up information is provided on this After Visit Summary.  MyChart is used to connect with patients for Virtual Visits (Telemedicine).  Patients are able to view lab/test results, encounter notes, upcoming appointments, etc.  Non-urgent messages can be sent to your provider as well.   To learn more about what you can do with MyChart, go to [email or web address].   Other Instructions None

## 2024-02-08 ENCOUNTER — Ambulatory Visit

## 2024-02-18 ENCOUNTER — Other Ambulatory Visit: Payer: Self-pay | Admitting: Emergency Medicine

## 2024-02-18 DIAGNOSIS — R079 Chest pain, unspecified: Secondary | ICD-10-CM

## 2024-02-19 ENCOUNTER — Ambulatory Visit (HOSPITAL_COMMUNITY)

## 2024-02-21 ENCOUNTER — Telehealth (HOSPITAL_COMMUNITY): Payer: Self-pay | Admitting: *Deleted

## 2024-02-21 NOTE — Telephone Encounter (Signed)
 Left a detailed message with instructions on voicemail regarding a STRESS TEST on 02/26/24 at 8:00.

## 2024-02-26 ENCOUNTER — Ambulatory Visit

## 2024-02-26 DIAGNOSIS — R079 Chest pain, unspecified: Secondary | ICD-10-CM

## 2024-02-26 MED ORDER — REGADENOSON 0.4 MG/5ML IV SOLN
0.4000 mg | Freq: Once | INTRAVENOUS | Status: AC
  Administered 2024-02-26: 0.4 mg via INTRAVENOUS

## 2024-02-26 MED ORDER — TECHNETIUM TC 99M TETROFOSMIN IV KIT
10.7000 | PACK | Freq: Once | INTRAVENOUS | Status: AC | PRN
  Administered 2024-02-26: 10.7 via INTRAVENOUS

## 2024-02-26 MED ORDER — TECHNETIUM TC 99M TETROFOSMIN IV KIT
30.5000 | PACK | Freq: Once | INTRAVENOUS | Status: AC | PRN
  Administered 2024-02-26: 30.5 via INTRAVENOUS

## 2024-02-27 LAB — MYOCARDIAL PERFUSION IMAGING
LV dias vol: 38 mL (ref 46–106)
LV sys vol: 8 mL (ref 3.8–5.2)
Nuc Stress EF: 80 %
Peak HR: 82 {beats}/min
Rest HR: 67 {beats}/min
Rest Nuclear Isotope Dose: 10.7 mCi
SDS: 1
SRS: 4
SSS: 5
ST Depression (mm): 0 mm
Stress Nuclear Isotope Dose: 30.5 mCi
TID: 0.88

## 2024-03-04 ENCOUNTER — Other Ambulatory Visit: Payer: Self-pay | Admitting: Family Medicine

## 2024-03-05 ENCOUNTER — Ambulatory Visit: Payer: Self-pay

## 2024-03-10 DIAGNOSIS — H35352 Cystoid macular degeneration, left eye: Secondary | ICD-10-CM | POA: Diagnosis not present

## 2024-03-10 DIAGNOSIS — H02836 Dermatochalasis of left eye, unspecified eyelid: Secondary | ICD-10-CM | POA: Diagnosis not present

## 2024-03-10 DIAGNOSIS — H43811 Vitreous degeneration, right eye: Secondary | ICD-10-CM | POA: Diagnosis not present

## 2024-03-10 DIAGNOSIS — H35032 Hypertensive retinopathy, left eye: Secondary | ICD-10-CM | POA: Diagnosis not present

## 2024-03-10 DIAGNOSIS — H34832 Tributary (branch) retinal vein occlusion, left eye, with macular edema: Secondary | ICD-10-CM | POA: Diagnosis not present

## 2024-03-10 DIAGNOSIS — H02833 Dermatochalasis of right eye, unspecified eyelid: Secondary | ICD-10-CM | POA: Diagnosis not present

## 2024-03-10 DIAGNOSIS — H43392 Other vitreous opacities, left eye: Secondary | ICD-10-CM | POA: Diagnosis not present

## 2024-03-11 LAB — OPHTHALMOLOGY REPORT-SCANNED

## 2024-03-21 ENCOUNTER — Encounter: Payer: Self-pay | Admitting: Family Medicine

## 2024-03-21 NOTE — Telephone Encounter (Signed)
 I spoke with Valerie Ball; Valerie Ball said for 2 days she has had bright red blood and black tarry stools and last time had bloody black stools was earlier this afternoon. No N & V. No fever. Valerie Ball said in last month she has lost 8 - 10 lbs without trying.  Valerie Ball said no diarrhea or constipation right now. Valerie Ball has had no Pepto bismol. Valerie Ball also having mid dull constant chest pain. Valerie Ball said has hiatal hernia but Valerie Ball is not sure if that is causing CP or something else. Valerie Ball is taking pepcid , plavix  75 mg and ASA 81 mg tab. Valerie Ball is going to [facility] ED now and will be evaluated and find out if should stop plavix  and ASA. Valerie Ball will cb next wk with update after ED visit.sending note to Dr Randeen Jacquet pool.

## 2024-03-21 NOTE — Telephone Encounter (Signed)
 Thanks- I definitely advise ER now  Will watch for correspondence and route this back to myself

## 2024-03-22 NOTE — Telephone Encounter (Signed)
 I don't think we have access to [facility] records in epic?   Please send for those, and please check in with patient Thanks

## 2024-03-25 NOTE — Progress Notes (Addendum)
"  °  HOSPITAL MEDICINE -  PROGRESS NOTE  Hospital Day: 2  Admission Date:  03/23/2024  PCP:  Laine Balls, MD  Extended Emergency Contact Information Primary Emergency Contact: [facility] Phone: [phone] Mobile Phone: [phone] Relation: Spouse    Subjective  Patient seen and examined underwent for colonoscopy today found to have semisolid stool in colon and colonoscopy was canceled. Patient had 2 bowel movements overnight, both were solid. She noticed some dark blood.. No active bleeding No fever chills    Objective   Temp:  [97.7 F (36.5 C)-98.7 F (37.1 C)] 98.4 F (36.9 C) Heart Rate:  [63-98] 68 Resp:  [14-18] 16 BP: (95-125)/(43-60) 111/50  Physical Exam General appearance patient is alert oriented x 3 not in distress HEENT no pallor no icterus no JVD Lungs clear to auscultation b/l, no crackles, rales or wheezes Heart S1-S2 heart regular rate and rhythm Abd: soft NT/ND, BS +ve Extremities no edema Neuro alert oriented x 3 no focal deficit   Labs   Pertinent labs and imaging reviewed Lab Results  Component Value Date   WBC 3.23 (L) 03/25/2024   HGB 9.5 (L) 03/25/2024   HCT 27.6 (L) 03/25/2024   MCV 83.6 03/25/2024   PLT 83 (L) 03/25/2024   Lab Results  Component Value Date   GLUCOSE 130 (H) 03/25/2024   CALCIUM  8.8 03/25/2024   NA 142 03/25/2024   K 3.8 03/25/2024   CO2 27 03/25/2024   CL 105 03/25/2024   BUN 12 03/25/2024   CREATININE 0.95 03/25/2024   Lab Results  Component Value Date   ALT 20 03/23/2024   AST 29 03/23/2024   BILITOT 0.6 03/23/2024   No results found for: INR, PROTIME  Micro: No results found for this visit on 03/23/24 (from the past 48 hours).     Assessment/Plan  Active Medications: insulin  aspart (NovoLOG )/insulin  lispro (HumaLOG) injection (WF), 0-12 Units, subcutaneous, TID PC omeprazole , 40 mg, oral, BID    Assessment and plan  Assessment & Plan GI bleed Patient serial H&H has been  stable. Overall received 1 unit of PRBC during hospital stay. Patient had a pedunculated duodenal polyp underwent polypectomy with clipping. Otherwise EGD was clear GI wanted to do colonoscopy, poor prep plan for reprep tonight and scope in a.m. Monitor H&H every 12 hours Continue omeprazole  40 twice daily GI notes reviewed  Hypertension - Blood pressure stable off medication HLD (hyperlipidemia) - Resume statin upon discharge DM (diabetes mellitus) (CMD) - Will manage with sliding scale  History of coronary artery disease status post stenting on December 2024.  Aspirin  Plavix  on hold due to GI bleeding.  CODE STATUS full code DVT prophylaxis SCD GI prophylaxis omeprazole  Discussed with patient, multidisciplinary meeting     Electronically signed by:  Kiran regmi MD,FACP  "

## 2024-03-26 NOTE — Telephone Encounter (Signed)
 Pt still in hospital. We do have access to hospital (care everywhere)

## 2024-03-26 NOTE — Discharge Summary (Addendum)
 "    General Medicine  Discharge Summary   Name: Valerie Ball Age: 79 yrs  MRN: [id number] DOB: 09/19/1945  Admit Date: 03/23/2024 Admitting Physician: Albina Sor, MD  Discharge Date: 03/26/2024 Discharge Physician: Ruel Shove, MD   No chief complaint on file.   Discharge Diagnoses:   Principal Problem (Resolved):   GI bleed Active Problems:   Hypertension   HLD (hyperlipidemia)   DM (diabetes mellitus) (CMD)  Discharge diagnosis. 1.  Acute blood loss anemia status post 1 unit of transfusion-EGD shows duodenal polyp which was removed colonoscopy shows 2 polyps which was not removed due to poor prep and needs repeat scope in a year 2.  NASH induced liver cirrhosis 3.  Coronary artery disease status post stenting in December 2024 on dual antiplatelet 4.  Diabetes mellitus 5.  Hyperlipidemia 6.  Hypertension 7.  Pancytopenia due to cirrhosis     Hospital Course:   For full details, please see H&P, progress notes, consult notes and ancillary notes.  Briefly 79 year old female with medical history significant for coronary artery disease status post PCI with LAD stent on December 2024 on DAPT, NASH with cirrhotic changes chronic thrombocytopenia hypertension hyperlipidemia hypothyroidism breast cancer status postmastectomy who presented with melena for 3 days followed by episode of bright red blood per rectum was hypotensive in ER with presenting hemoglobin of 7.8 patient was started on Protonix , octreotide drip, transfer [facility] to [facility] GI consulted received 1 units of blood, patient underwent EGD found to have duodenal polyp which was removed aspirin  and Plavix  was on hold. Patient underwent colonoscopy which was poor prep had 2 polyps which was not removed due to poor prep and need repeat colonoscopy. Patient has history of Hollie with cirrhosis and low platelets Patient last stent in 2024 December.  High risk for bleeding on aspirin  Plavix  and  thrombocytopenia. GI recommended resume aspirin .  Plavix  on hold and to talk with cardiology if patient need dual antiplatelets. Patient serial hemoglobin remained stable tolerated diet and was discharged home with plan for outpatient follow-up with PCP cardiology and GI as an outpatient . patient will be on current continued on Protonix  40 daily Her other medical condition was stable and home medication was continued This was discussed in extent with patient and husband at bedside they verbalized understanding.      Readmission risk score (calculated)  Predictive Model Details        12.1% (Medium)  Factor Value   Calculated 03/26/2024 12:08 11% Latest hemoglobin in last 72 hrs 9.3 g/dL   Readmission Risk Score v2 Model 11% Number of ED visits in last 90 days 0    7% Number of hospitalizations in last year 0    7% Braden score 20    5% Number of appointments in last 90 days 0      The patient's chronic medical conditions were treated accordingly per the patient's home medication regimen except as noted in the plan above and in the medication list below.    Discharge Condition:   Disposition: Patient discharged to Home or Self Care    Physical Exam at Discharge   Patient was seen and examined on the day of discharge and her clinical condition was stable for discharge     Discharge Medications:      Medication List     PAUSE taking these medications    clopidogreL  75 mg tablet Wait to take this until: April 10, 2024 To be resumed by cardiologist  of necessary Commonly known as: PLAVIX  Take 75 mg by mouth daily.       START taking these medications    pantoprazole  40 mg EC tablet Commonly known as: PROTONIX  Take 1 tablet (40 mg total) by mouth every morning before breakfast.       CONTINUE taking these medications    aspirin  81 mg EC tablet Take 81 mg by mouth daily. swallow whole   ezetimibe  10 mg tablet Commonly known as: ZETIA  Take 10 mg by mouth  daily.   FLUoxetine  40 mg capsule Commonly known as: PROzac  Take 40 mg by mouth daily.   Jardiance  25 mg Tab Generic drug: empagliflozin  Take 25 mg by mouth daily.   levothyroxine  50 mcg tablet Commonly known as: SYNTHROID  Take 50 mcg by mouth every morning.   rOPINIRole  1 mg tablet Commonly known as: REQUIP  Take 1 mg by mouth at bedtime.   rosuvastatin  10 mg tablet Commonly known as: CRESTOR  Take 10 mg by mouth daily.   Trulicity  0.75 mg/0.5 mL subcutaneous pen injector Generic drug: dulaglutide  Inject 0.75 mg under the skin every 7 days. Saturday       STOP taking these medications    amLODIPine  5 mg tablet Commonly known as: NORVASC    famotidine  20 mg tablet Commonly known as: PEPCID    losartan  100 mg tablet Commonly known as: COZAAR          Where to Get Your Medications     These medications were sent to [id number] - [place], [place] - [address] - PHONE: [phone] - FAX: [phone]  [address], [place] [address]    Phone: [phone]  pantoprazole  40 mg EC tablet       Surgeries/Procedures:      Consults:   IP CONSULT TO GASTROENTEROLOGY       Time spent on discharge: 35 min  this includes but not limited to 50% face to face time, time reviewing vitals, medications, lab findings, imaging, discussing with Pharmacy about current medications and reconciliation and discussing the management plan with the nursing staff and, case manager/ SW, patient at the bedside.  Electronically signed by:  Kiran Regmi MD,FACP  "

## 2024-03-27 ENCOUNTER — Telehealth: Payer: Self-pay

## 2024-03-27 ENCOUNTER — Other Ambulatory Visit: Payer: Self-pay | Admitting: Family Medicine

## 2024-03-27 NOTE — Transitions of Care (Post Inpatient/ED Visit) (Signed)
" ° °  03/27/2024  Name: Valerie Ball MRN: [id number] DOB: 1946/02/05  Today's TOC FU Call Status: Today's TOC FU Call Status:: Unsuccessful Call (1st Attempt) Unsuccessful Call (1st Attempt) Date: 03/27/24  Attempted to reach the patient regarding the most recent Inpatient/ED visit.  Follow Up Plan: Additional outreach attempts will be made to reach the patient to complete the Transitions of Care (Post Inpatient/ED visit) call.   Arvin Seip RN, BSN, CCM [employer], Population Health Case Manager Phone: [phone]  "

## 2024-03-28 ENCOUNTER — Telehealth: Payer: Self-pay

## 2024-03-28 ENCOUNTER — Ambulatory Visit

## 2024-03-28 VITALS — BP 114/52 | HR 92 | Ht 63.0 in | Wt 142.5 lb

## 2024-03-28 DIAGNOSIS — I2510 Atherosclerotic heart disease of native coronary artery without angina pectoris: Secondary | ICD-10-CM

## 2024-03-28 DIAGNOSIS — H269 Unspecified cataract: Secondary | ICD-10-CM | POA: Insufficient documentation

## 2024-03-28 DIAGNOSIS — I219 Acute myocardial infarction, unspecified: Secondary | ICD-10-CM | POA: Insufficient documentation

## 2024-03-28 DIAGNOSIS — F32A Depression, unspecified: Secondary | ICD-10-CM | POA: Insufficient documentation

## 2024-03-28 NOTE — Transitions of Care (Post Inpatient/ED Visit) (Signed)
" ° °  03/28/2024  Name: Valerie Ball MRN: [id number] DOB: [birth year]  Today's TOC FU Call Status: Today's TOC FU Call Status:: Unsuccessful Call (2nd Attempt) Unsuccessful Call (2nd Attempt) Date: 03/28/24  Attempted to reach the patient regarding the most recent Inpatient/ED visit.  Follow Up Plan: Additional outreach attempts will be made to reach the patient to complete the Transitions of Care (Post Inpatient/ED visit) call.   Arvin Seip RN, BSN, CCM [employer], Population Health Case Manager Phone: [phone]  "

## 2024-03-28 NOTE — Patient Instructions (Signed)
 Medication Instructions:  Your physician has recommended you make the following change in your medication:   Resume taking Aspirin  81 mg daily   *If you need a refill on your cardiac medications before your next appointment, please call your pharmacy*   Lab Work: None ordered If you have labs (blood work) drawn today and your tests are completely normal, you will receive your results only by: MyChart Message (if you have MyChart) OR A paper copy in the mail If you have any lab test that is abnormal or we need to change your treatment, we will call you to review the results.   Testing/Procedures: None ordered   Follow-Up: At [facility], you and your health needs are our priority.  As part of our continuing mission to provide you with exceptional heart care, we have created designated Provider Care Teams.  These Care Teams include your primary Cardiologist (physician) and Advanced Practice Providers (APPs -  Physician Assistants and Nurse Practitioners) who all work together to provide you with the care you need, when you need it.  We recommend signing up for the patient portal called MyChart.  Sign up information is provided on this After Visit Summary.  MyChart is used to connect with patients for Virtual Visits (Telemedicine).  Patients are able to view lab/test results, encounter notes, upcoming appointments, etc.  Non-urgent messages can be sent to your provider as well.   To learn more about what you can do with MyChart, go to [email or web address].    Your next appointment:   6 month(s)  The format for your next appointment:   In Person  Provider:   Alean Kobus, MD    Other Instructions none  Important Information About Sugar

## 2024-03-28 NOTE — Assessment & Plan Note (Signed)
"   Lexiscan  stress test 02/26/2024 that showed no evidence of ischemia.  Symptoms of tiredness and fatigue are currently likely related to her anemia.  Okay to discontinue Plavix  since been 12 months since her PCI. Resume aspirin  81 mg once daily. Monitor for any GI bleeding and notify us  promptly. Okay to interrupt aspirin  as needed for any interventions or for acute GI bleeding.  Reassured her from cardiac standpoint, in addition to her recent Lexiscan  stress test showing no ischemia, her heart is in good shape and was able to tolerate the stress of acute anemia recently.  Would recommend earlier follow-up with her hematologist Dr. Ezzard to discuss long-term safety of continued aspirin  given her chronic thrombocytopenia.   "

## 2024-03-28 NOTE — Progress Notes (Signed)
 "  Cardiology Consultation:    Date:  03/28/2024   ID:  Valerie Ball, DOB November 17, 1945, MRN [id number]  PCP:  Valerie Laine LABOR, MD  Cardiologist:  Valerie SAUNDERS Audley Hinojos, MD   Referring MD: Valerie Laine LABOR, MD   No chief complaint on file.    ASSESSMENT AND PLAN:   [name] 79 year old woman with history of CAD cardiac CT November 2024 was abnormal, subsequently cardiac catheterization and PCI of mid LAD 03/08/2023, subsequently with chest pain was readmitted and redo cath on 03-27-2023 showed patent LAD stent and nonsignificant by RFR ostial RCA lesion and CFR of 1.8 suggested microvascular dysfunction with elevated LVEDP 25 mmHg ), repeat echocardiogram 01/31/2024 at [facility] images reviewed myself show normal biventricular function, LVH mild, MAC with mild mitral stenosis based on mean gradient 3 mmHg heart rate 70/min, mild aortic insufficiency, mild MR.  Diastolic dysfunction appears grade 1 on my review of images. With ongoing symptoms of chest pain further evaluated with Lexiscan  stress test 02/26/2024 that showed no evidence of ischemia.   Also has history hypertension, hyperlipidemia, hypothyroidism, irritable bowel syndrome, hiatal hernia s/p unsuccessful repair, seizure disorder, chronic thrombocytopenia, gastroparesis, NASH, chronic dizziness, CKD stage III, chronic microvascular ischemic changes on CT head September 2024, hemorrhoids, palpitations without significant abnormalities on event monitor October 2024.   Now s/p acute anemia secondary to GI bleed 03/23/2024 evaluated at [facility], s/p EGD and colonoscopy, Plavix  discontinued, recommended to restart aspirin  which she has yet to begin.  Here for an earlier follow-up after her recent GI bleed related admission, to review reinitiation of aspirin .  Problem List Items Addressed This Visit       Cardiovascular and Mediastinum   CAD (coronary artery disease)  CT Cardiac 01/19/2023 calcium  score 411, plaque volume 366,  moderate stenosis LAD, mild stenosis D1, RCA, LCx/OM 3; S/p PCI of Mid LAD 03/08/2023;rpt cath 03/27/2023 stable - Primary    Lexiscan  stress test 02/26/2024 that showed no evidence of ischemia.  Symptoms of tiredness and fatigue are currently likely related to her anemia.  Okay to discontinue Plavix  since been 12 months since her PCI. Resume aspirin  81 mg once daily. Monitor for any GI bleeding and notify us  promptly. Okay to interrupt aspirin  as needed for any interventions or for acute GI bleeding.  Reassured her from cardiac standpoint, in addition to her recent Lexiscan  stress test showing no ischemia, her heart is in good shape and was able to tolerate the stress of acute anemia recently.  Would recommend earlier follow-up with her hematologist Dr. Ezzard to discuss long-term safety of continued aspirin  given her chronic thrombocytopenia.        Return to clinic tentatively in 6 months.    History of Present Illness:    Valerie Ball is a 79 y.o. female who is being seen today for follow-up visit. PCP is Tower, Laine LABOR, MD.  Last visit with me in the office was 02/07/2024. Chronic thrombocytopenia, follows up with Dr. Ezzard  history of CAD cardiac CT November 2024 was abnormal, subsequently cardiac catheterization and PCI of mid LAD 03/08/2023, subsequently with chest pain was readmitted and redo cath on 03-27-2023 showed patent LAD stent and nonsignificant by RFR ostial RCA lesion and CFR of 1.8 suggested microvascular dysfunction with elevated LVEDP 25 mmHg ), repeat echocardiogram 01/31/2024 at [facility] images reviewed myself show normal biventricular function, LVH mild, MAC with mild mitral stenosis based on mean gradient 3 mmHg heart rate 70/min, mild aortic  insufficiency, mild MR.  Diastolic dysfunction appears grade 1 on my review of images. With ongoing symptoms of chest pain further evaluated with Lexiscan  stress test 02/26/2024 that showed no evidence of  ischemia.   Also has history hypertension, hyperlipidemia, hypothyroidism, irritable bowel syndrome, hiatal hernia s/p unsuccessful repair, seizure disorder, chronic thrombocytopenia, gastroparesis, NASH, chronic dizziness, CKD stage III, chronic microvascular ischemic changes on CT head September 2024, hemorrhoids, palpitations without significant abnormalities on event monitor October 2024.   Was recently admitted at [facility] 03/23/2024 through 03/26/2024 for dark and bloody stools for about a day, CBC initially with hemoglobin 7.8 and hematocrit 22.9, [in November hemoglobin was 12.7] with platelets down to 67 received 1 unit PRBC transfusion and underwent EGD and colonoscopy, currently returning duodenal polyp was noted which was removed and clipping done.  Recommended to discontinue NSAIDs.  Colonoscopy however noted 2 polyps but with poor prep and recommended outpatient follow-up tentatively in June.  Follow-up CBC 03/26/2024 noted hemoglobin 9.3, hematocrit 27.4, platelets 83. Discharge summary notes to resume aspirin .  Here for the visit today accompanied by her husband. Mentions that she has not yet started taking aspirin .  Mentions she is gradually feeling her strength returned but continues to feel significantly tired and weak. Denies any active blood in urine or stools at this time. Denies any chest pain but gets easily tired and out of breath with walking.  Good compliance with her medications.  Past Medical History:  Diagnosis Date   Allergic rhinitis 08/22/2006   Qualifier: Diagnosis of   By: Cecilie CMA, NANNIE Ivy      IMO SNOMED Dx Update Oct 2024     Allergy    Alternating constipation and diarrhea 08/12/2015   Anemia    Angiodysplasia of colon    Anxiety    Arthritis    Arthritis of both knees    Atherosclerotic heart disease 01/31/2024   AVM (arteriovenous malformation) of colon    Benign hypertension    Bilateral knee pain 05/11/2022   Blurred vision, right eye  08/11/2021   Patient does have blurred vision right eye,     Borderline diabetic    per patient medical history form   Branch retinal vein occlusion with macular edema of left eye (HCC) 07/14/2019   CME superior to the fovea OS has worsened at 7-week interval from macular branch retinal vein occlusion, repeat Avastin  today and evaluate exam in 6 weeks     Breast cancer (HCC) 02/2009   breast CA L invasive ductal CA(hormone receptor positive.)   CAD (coronary artery disease)  CT Cardiac 01/19/2023 calcium  score 411, plaque volume 366, moderate stenosis LAD, mild stenosis D1, RCA, LCx/OM 3 02/01/2023   Cataract    Chest pain 12/29/2022   Chronic idiopathic thrombocytopenia (HCC) 03/23/2023   CKD (chronic kidney disease) 04/02/2023   Colon cancer screening 01/23/2014   Colon polyp    hyperplastic   Colon stricture (HCC) 01/14/2003   Cystoid macular edema of left eye 07/14/2019   Decreased calculated GFR 05/11/2022   Depression    Diabetic gastroparesis (HCC) 03/27/2016   Typical symptoms -suspect      No detectable diabetic retinopathy 12-27-2020     Diabetic neuropathy (HCC) 11/25/2013   Diaphragmatic hernia 03/02/2008   Qualifier: Diagnosis of   By: Kowalk CMA LEODIS), Leisha      IMO SNOMED Dx Update Oct 2024     Diverticulitis of colon 10/14/2014   Diverticulosis    Dizziness 01/07/2014   Dyspnea on  exertion 01/04/2023   Encounter for Medicare annual wellness exam 01/23/2014   ESOPHAGEAL STRICTURE 03/02/2008   Qualifier: Diagnosis of   By: Genie CMA (AAMA), Chick         Essential hypertension 08/22/2006   Qualifier: Diagnosis of   By: Cecilie CMA, NANNIE Ivy         Estrogen deficiency 01/23/2014   Eye pain 03/23/2023   Fatigue 05/20/2018   Fatty liver    Fever, low grade 09/24/2020   GAD (generalized anxiety disorder) 04/15/2007   Qualifier: Diagnosis of   By: Baird FNP, Frieda Kipper        GI bleed    Gout    Heart murmur    Hepatic steatosis    Hiatal hernia     Hip pain, bilateral 08/31/2017   History of breast cancer 08/02/2009   Qualifier: Diagnosis of   By: Randeen MD, Laine Caldron        History of CAD (coronary artery disease) 03/23/2023   History of COVID-19 09/24/2020   History of removal of implants of both breasts 07/01/2013   06/25/13 elected to have bilateral breast implants removed.     History of seizure 08/22/2006   Qualifier: Diagnosis of   By: Cecilie CMA, NANNIE Ivy         Hyperlipidemia    Hyperlipidemia associated with type 2 diabetes mellitus (HCC) 08/22/2006   Qualifier: Diagnosis of   By: Cecilie CMA, NANNIE Ivy         Hypertensive retinopathy of left eye 07/14/2019   Hypothyroidism    IBS (irritable bowel syndrome)    Lap Nissen October 2013 01/19/2012   Laparoscopic takedown of large type III mixed hiatus hernia with primary closure of the diaphragm with 3 sutures posteriorally (post 2 with pledgets) and Nissen fundoplication over a #56 lighted bougie        Large type III mixed hiatus hernia  12/06/2011   Plan surgery to repair diaphragm and do Nissen fundoplication     Leg pain, bilateral 07/25/2023   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 Type not noted on medical history form.     Palpitations 01/04/2023   Peritoneal adhesions 03/02/2008   Qualifier: Diagnosis of   By: Genie CMA LEODIS), Chick      IMO SNOMED Dx Update Oct 2024     Pharyngitis 02/06/2024   Poor balance 01/30/2023   Poorly controlled type 2 diabetes mellitus with neuropathy (HCC) 08/19/2007   Qualifier: Diagnosis of   By: Randeen MD, Laine Caldron        Posterior vitreous detachment of  right eye 11/06/2019   Postmenopausal hormone therapy    per patient medical history form.   Retinal vein occlusion, branch (HCC) 01/03/2016   Left with hemorrhage and edema (Dr Elner) 2017     Routine general medical examination at a health care facility 07/11/2011   Salmonella 05/2000   renal effects secondary to dehydration   Seizure disorder (HCC)    Seizures (HCC)    one seizure several yrs ago --etiology unknown-no problem since   Stress reaction 10/25/2011  SYNDROME, CARPAL TUNNEL 08/23/2006   Qualifier: Diagnosis of   By: Randeen MD, Laine Caldron        SYNDROME, RESTLESS LEGS 08/23/2006   Qualifier: Diagnosis of   By: Randeen MD, Laine Caldron        Thrombocytopenia 11/25/2013   TRANSAMINASES, SERUM, ELEVATED 11/11/2009   Qualifier: Diagnosis of   By: Randeen MD, Laine Caldron        Tremor 01/20/2014   Head and right hand      Vitamin B12 deficiency 09/21/2016   Vitreous floaters of left eye 07/14/2019   The nature of vitreous floaters was discussed with the patient as well as their frequent occurrence with development of posterior vitreous detachment. The significance of flashes and field cuts was discussed with the patient. The need for a dilated fundus examination was discussed and advice given to return immediately for new or different floaters .  More uncommonly, vitreous floaters, debris, or    Past Surgical History:  Procedure Laterality Date   ABDOMINAL HYSTERECTOMY  1983   BREAST SURGERY  02/2009   breast biopsy invasive ductal CA-bilateral mastectomies   CORONARY PRESSURE/FFR STUDY N/A 03/27/2023   Procedure: CORONARY PRESSURE/FFR STUDY;  Surgeon: Wendel Lurena POUR, MD;  Location: [facility];  Service: Cardiovascular;  Laterality: N/A;   CORONARY STENT INTERVENTION N/A 03/08/2023   Procedure: CORONARY STENT INTERVENTION;  Surgeon: Verlin Lonni BIRCH, MD;  Location: [facility];  Service: Cardiovascular;  Laterality: N/A;   COSMETIC SURGERY     EPIGASTRIC  HERNIA REPAIR  01/02/2012   Procedure: HERNIA REPAIR EPIGASTRIC ADULT;  Surgeon: Donnice KATHEE Lunger, MD;  Location: [facility];  Service: General;  Laterality: N/A;  Laparoscopic Repair Paraesophageal Hernia, Nissen   EYE SURGERY Left 07/2017   Dr. Elner   GANGLION CYST EXCISION     HERNIA REPAIR     Repair did not work.  It came undone.   LAPAROSCOPIC NISSEN FUNDOPLICATION  01/02/2012   Procedure: LAPAROSCOPIC NISSEN FUNDOPLICATION;  Surgeon: Donnice KATHEE Lunger, MD;  Location: [facility];  Service: General;  Laterality: N/A;   LEFT HEART CATH AND CORONARY ANGIOGRAPHY N/A 03/08/2023   Procedure: LEFT HEART CATH AND CORONARY ANGIOGRAPHY;  Surgeon: Verlin Lonni BIRCH, MD;  Location: [facility];  Service: Cardiovascular;  Laterality: N/A;   LEFT HEART CATH AND CORONARY ANGIOGRAPHY N/A 03/27/2023   Procedure: LEFT HEART CATH AND CORONARY ANGIOGRAPHY;  Surgeon: Wendel Lurena POUR, MD;  Location: [facility];  Service: Cardiovascular;  Laterality: N/A;   MASTECTOMY  04/2009   Bilateral for ductal carcinoma on L   OVARY SURGERY  1986   ovarian tumor removed   TUBAL LIGATION      Current Medications: Active Medications[1]   Allergies:   Arimidex [anastrozole], Glipizide , Metformin  and related, and Penicillins   Social History   Socioeconomic History   Marital status: Married    Spouse name: Not on file   Number of children: 3   Years of education: Not on file   Highest education level: Not on file  Occupational History   Occupation: retired    Associate Professor: UNEMPLOYED  Tobacco Use   Smoking status: Never   Smokeless tobacco: Never  Vaping Use   Vaping status: Never Used  Substance and Sexual Activity   Alcohol use: Yes    Comment: occasional   Drug use: Never   Sexual activity: Not Currently  Other Topics Concern   Not on file  Social History Narrative   **  Merged History Encounter **       1 cup of coffee every morning   Social Drivers of Health   Tobacco Use: Low  Risk (03/28/2024)   Patient History    Smoking Tobacco Use: Never    Smokeless Tobacco Use: Never    Passive Exposure: Not on file  Financial Resource Strain: Low Risk (03/23/2024)   Received from [facility]   Overall Financial Resource Strain (CARDIA)    How hard is it for you to pay for the very basics like food, housing, medical care, and heating?: Not hard at all  Food Insecurity: Low Risk (03/23/2024)   Received from [facility]   Epic    Within the past 12 months, you worried that your food would run out before you got money to buy more: Never true    Within the past 12 months, the food you bought just didn't last and you didn't have money to get more. : Never true  Transportation Needs: No Transportation Needs (03/23/2024)   Received from [facility]    In the past 12 months, has lack of reliable transportation kept you from medical appointments, meetings, work or from getting things needed for daily living? : No  Physical Activity: Inactive (02/28/2023)   Exercise Vital Sign    Days of Exercise per Week: 0 days    Minutes of Exercise per Session: 0 min  Stress: Stress Concern Present (02/28/2023)   [employer] of Occupational Health - Occupational Stress Questionnaire    Feeling of Stress : Rather much  Social Connections: Unknown (03/23/2024)   Received from [facility]   Social Connection and Isolation Panel    In a typical week, how many times do you talk on the phone with family, friends, or neighbors?: More than three times a week    Frequency of Social Gatherings with Friends and Family: Not on file    Attends Religious Services: Not on file    Active Member of Clubs or Organizations: Not on file    Attends Banker Meetings: Not on file    Marital Status: Not on file  Depression (PHQ2-9): High Risk (02/28/2023)   Depression (PHQ2-9)    PHQ-2 Score: 12  Alcohol Screen: Low Risk (02/28/2023)   Alcohol Screen    Last Alcohol  Screening Score (AUDIT): 0  Housing: Low Risk (03/23/2024)   Received from [facility]   Epic    What is your living situation today?: I have a steady place to live    Think about the place you live. Do you have problems with any of the following? Choose all that apply:: None/None on this list  Utilities: Low Risk (03/23/2024)   Received from [facility]   Utilities    In the past 12 months has the electric, gas, oil, or water company threatened to shut off services in your home? : No  Health Literacy: Adequate Health Literacy (02/28/2023)   B1300 Health Literacy    Frequency of need for help with medical instructions: Never     Family History: The patient's family history includes Arthritis in her brother, sister, sister, and sister; Breast cancer in her mother, sister, and sister; Cancer in her father, mother, sister, and sister; Diabetes in her brother, father, sister, and sister; Esophageal cancer in her father; Heart attack in her father; Heart disease in her father and sister; Hyperlipidemia in her sister; Hypertension in her brother, mother, sister, and sister; Kidney disease in  her sister and sister; Stroke in her mother. ROS:   Please see the history of present illness.    All 14 point review of systems negative except as described per history of present illness.  EKGs/Labs/Other Studies Reviewed:    The following studies were reviewed today:   EKG:       Recent Labs: 05/08/2023: TSH 1.36 01/10/2024: ALT 11 01/31/2024: Magnesium 1.9 02/06/2024: BUN 19; Creatinine, Ser 1.08; Hemoglobin 12.7; Platelets 122.0; Potassium 3.7; Sodium 140  Recent Lipid Panel    Component Value Date/Time   CHOL 75 02/01/2024 0455   TRIG 135 02/01/2024 0455   HDL 30 (L) 02/01/2024 0455   CHOLHDL 2.5 02/01/2024 0455   VLDL 27 02/01/2024 0455   LDLCALC 18 02/01/2024 0455   LDLDIRECT 55.0 05/04/2022 0909    Physical Exam:    VS:  BP (!) 114/52   Pulse 92   Ht 5' 3 (1.6 m)   Wt 142 lb 8  oz (64.6 kg)   LMP 03/20/1981   SpO2 98%   BMI 25.24 kg/m     Wt Readings from Last 3 Encounters:  03/28/24 142 lb 8 oz (64.6 kg)  02/26/24 143 lb (64.9 kg)  02/07/24 143 lb 3.2 oz (65 kg)     GENERAL:  Well nourished, well developed in no acute distress NECK: No JVD; No carotid bruits CARDIAC: RRR, S1 and S2 present, no murmurs, no rubs, no gallops CHEST:  Clear to auscultation without rales, wheezing or rhonchi  Extremities: No pitting pedal edema. Pulses bilaterally symmetric with radial 2+ and dorsalis pedis 2+ NEUROLOGIC:  Alert and oriented x 3  Medication Adjustments/Labs and Tests Ordered: Current medicines are reviewed at length with the patient today.  Concerns regarding medicines are outlined above.  No orders of the defined types were placed in this encounter.  No orders of the defined types were placed in this encounter.   Signed, Valerie reddy Jeanee Fabre, MD, MPH, [facility]. 03/28/2024 3:29 PM    [facility]    [1]  Current Meds  Medication Sig   aspirin  EC 81 MG tablet Take 1 tablet (81 mg total) by mouth daily. Swallow whole.   Dulaglutide  (TRULICITY ) 3 MG/0.5ML SOAJ INJECT 0.5 ML (3 MG) UNDER THE SKIN ONCE A WEEK   empagliflozin  (JARDIANCE ) 25 MG TABS tablet TAKE 1 TABLET DAILY BEFORE BREAKFAST   ezetimibe  (ZETIA ) 10 MG tablet TAKE 1 TABLET DAILY   famotidine  (PEPCID ) 20 MG tablet TAKE 1 TABLET TWICE A DAY   FLUoxetine  (PROZAC ) 40 MG capsule TAKE 1 CAPSULE DAILY   fluticasone  (FLONASE ) 50 MCG/ACT nasal spray USE 2 SPRAYS IN EACH NOSTRIL DAILY AS NEEDED FOR ALLERGIES   isosorbide  mononitrate (IMDUR ) 30 MG 24 hr tablet Take 1 tablet (30 mg total) by mouth daily.   levothyroxine  (SYNTHROID ) 50 MCG tablet Take 1 tablet (50 mcg total) by mouth daily before breakfast.   losartan  (COZAAR ) 25 MG tablet Take 1 tablet (25 mg total) by mouth daily.   metoprolol  succinate (TOPROL -XL) 25 MG 24 hr tablet Take 0.5 tablets (12.5 mg total) by mouth daily.    Multiple Vitamin (MULTIVITAMIN WITH MINERALS) TABS tablet Take 1 tablet by mouth at bedtime.   nitroGLYCERIN  (NITROSTAT ) 0.4 MG SL tablet Place 1 tablet (0.4 mg total) under the tongue every 5 (five) minutes as needed for chest pain.   pantoprazole  (PROTONIX ) 20 MG tablet Take 20 mg by mouth daily.   repaglinide  (PRANDIN ) 1 MG tablet TAKE 1 TABLET DAILY  BEFORE SUPPER   rOPINIRole  (REQUIP ) 1 MG tablet TAKE 1 TABLET AT BEDTIME   rosuvastatin  (CRESTOR ) 20 MG tablet Take 1 tablet (20 mg total) by mouth daily.   "

## 2024-03-30 NOTE — Progress Notes (Unsigned)
 "  Subjective:    Patient ID: Valerie Ball, female    DOB: 12/14/1945, 79 y.o.   MRN: [id number]  HPI  Wt Readings from Last 3 Encounters:  03/28/24 142 lb 8 oz (64.6 kg)  02/26/24 143 lb (64.9 kg)  02/07/24 143 lb 3.2 oz (65 kg)      There were no vitals filed for this visit.  Pt presents for follow up of hospitalization for  GI bleed     Was hospitalized at [facility]   Per hospital notes   Assessment and plan  Assessment & Plan GI bleed Patient serial H&H has been stable. Overall received 1 unit of PRBC during hospital stay. Patient had a pedunculated duodenal polyp underwent polypectomy with clipping. Otherwise EGD was clear GI wanted to do colonoscopy, poor prep plan for reprep tonight and scope in a.m. Monitor H&H every 12 hours Continue omeprazole  40 twice daily GI notes reviewed  Hgb 9.3 on 03/26/24 Hgb 9.5 on 03/24/24 Prior 7.5 on 03/23/24  Hypertension - Blood pressure stable off medication HLD (hyperlipidemia) - Resume statin upon discharge DM (diabetes mellitus) (CMD) - Will manage with sliding scale  History of coronary artery disease status post stenting on December 2024. Aspirin  Plavix  on hold due to GI bleeding.    Labs Low mag on 1/7 of 1.8 GFR 65 on 1/7 HGB 9.3 on 1/7 Total protein 5.7   Normal bili and ast and alt  on 1/4 Plt ct 83 on 1/7   Colonoscopy report: Findings  The terminal ileum appeared normal.  Mild diverticulosis with few diverticula with no inflammation containing  no content in the sigmoid colon.  Internal large, prolapsing hemorrhoids observed during digital rectal exam  and retroflexion; no bleeding was observed.  At least 2-3 subcentimeter polyps were noted in the left and right colon  which were not removed given suboptimal prep and indication for bleeding.   EGD report Findings  Normal mucosa esophagus  Normal mucosa stomach  In the third portion of the duodenum, there was a pedunculated polyp  ([place] Ip)  measuring 1cm in size s/p hot snare polypectomy en bloc;  hemoclip x2 placed at post-polypectomy site to prevent bleeding.  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   Pharyngitis 02/06/2024   Heart murmur 01/31/2024   Atherosclerotic heart disease 01/31/2024   Mild aortic regurgitation prior TTE November 2024 01/10/2024   Seizure disorder (HCC)    Arthritis    Leg pain, bilateral 07/25/2023   CKD (chronic kidney disease) 04/02/2023   Eye pain 03/23/2023   History of CAD (coronary artery disease) 03/23/2023   Chronic idiopathic thrombocytopenia (HCC) 03/23/2023   Mitral stenosis and aortic incompetence, by TTE 01/25/2023 02/01/2023   CAD (coronary artery disease)  CT Cardiac 01/19/2023 calcium  score 411, plaque volume 366, moderate stenosis LAD, mild stenosis D1, RCA, LCx/OM 3; S/p PCI of Mid LAD 03/08/2023;rpt cath 03/27/2023 stable 02/01/2023   Poor balance 01/30/2023   Dyspnea on exertion 01/04/2023   Palpitations 01/04/2023   Allergy    Angiodysplasia of colon    Anxiety    Arthritis of both knees  Borderline diabetic    Colon polyp    Diverticulosis    GI bleed    Hepatic steatosis    Hiatal hernia    Hyperlipidemia    Benign hypertension    Macular edema    Menopause    Postmenopausal hormone therapy    Seizures (HCC)    Chest pain 12/29/2022   Bilateral knee pain 05/11/2022   Decreased calculated GFR 05/11/2022   Blurred vision, right eye 08/11/2021   History of COVID-19 09/24/2020   Fever, low grade 09/24/2020   Posterior vitreous detachment of right eye 11/06/2019   Cystoid macular edema of left eye 07/14/2019   Branch retinal vein  occlusion with macular edema of left eye (HCC) 07/14/2019   Hypertensive retinopathy of left eye 07/14/2019   Vitreous floaters of left eye 07/14/2019   Fatigue 05/20/2018   Hip pain, bilateral 08/31/2017   Vitamin B12 deficiency 09/21/2016   Diabetic gastroparesis (HCC) 03/27/2016   Retinal vein occlusion, branch (HCC) 01/03/2016   Alternating constipation and diarrhea 08/12/2015   AVM (arteriovenous malformation) of colon 05/18/2015   IBS (irritable bowel syndrome) 02/28/2015   Diverticulitis of colon 10/14/2014   Gout 07/22/2014   Hypothyroidism 04/23/2014   Encounter for Medicare annual wellness exam 01/23/2014   Estrogen deficiency 01/23/2014   Colon cancer screening 01/23/2014   Dizziness 01/07/2014   Allergic rhinitis 01/07/2014   Thrombocytopenia 11/25/2013   Diabetic neuropathy (HCC) 11/25/2013   History of removal of implants of both breasts 07/01/2013   Lap Nissen October 2013 01/19/2012   Large type III mixed hiatus hernia  12/06/2011   Stress reaction 10/25/2011   Routine general medical examination at a health care facility 07/11/2011   Non-insulin  treated type 2 diabetes mellitus (HCC) 07/25/2010   TRANSAMINASES, SERUM, ELEVATED 11/11/2009   History of breast cancer 08/02/2009   Breast cancer (HCC) 02/2009   ESOPHAGEAL STRICTURE 03/02/2008   Diaphragmatic hernia 03/02/2008   Peritoneal adhesions 03/02/2008   Melena 01/2008   Poorly controlled type 2 diabetes mellitus with neuropathy (HCC) 08/19/2007   GAD (generalized anxiety disorder) 04/15/2007   DISORDERS, ORGANIC SLEEP NEC 08/23/2006   SYNDROME, RESTLESS LEGS 08/23/2006   SYNDROME, CARPAL TUNNEL 08/23/2006   Hyperlipidemia associated with type 2 diabetes mellitus (HCC) 08/22/2006   Essential hypertension 08/22/2006   Allergic rhinitis 08/22/2006   Fatty liver 08/22/2006   History of seizure 08/22/2006   Colon stricture (HCC) 01/14/2003   Salmonella 05/2000   Past Medical History:  Diagnosis Date    Allergic rhinitis 08/22/2006   Qualifier: Diagnosis of   By: Cecilie CMA, NANNIE Ivy      IMO SNOMED Dx Update Oct 2024     Allergy    Alternating constipation and diarrhea 08/12/2015   Anemia    Angiodysplasia of colon    Anxiety    Arthritis    Arthritis of both knees    Atherosclerotic heart disease 01/31/2024   AVM (arteriovenous malformation) of colon    Benign hypertension    Bilateral knee pain 05/11/2022   Blurred vision, right eye 08/11/2021   Patient does have blurred vision right eye,     Borderline diabetic    per patient medical history form   Branch retinal vein occlusion with macular edema of left eye (HCC) 07/14/2019   CME superior to the fovea OS has worsened at 7-week interval from macular branch retinal vein occlusion, repeat Avastin  today and evaluate exam in 6 weeks     Breast cancer (HCC) 02/2009  breast CA L invasive ductal CA(hormone receptor positive.)   CAD (coronary artery disease)  CT Cardiac 01/19/2023 calcium  score 411, plaque volume 366, moderate stenosis LAD, mild stenosis D1, RCA, LCx/OM 3 02/01/2023   Cataract    Chest pain 12/29/2022   Chronic idiopathic thrombocytopenia (HCC) 03/23/2023   CKD (chronic kidney disease) 04/02/2023   Colon cancer screening 01/23/2014   Colon polyp    hyperplastic   Colon stricture (HCC) 01/14/2003   Cystoid macular edema of left eye 07/14/2019   Decreased calculated GFR 05/11/2022   Depression    Diabetic gastroparesis (HCC) 03/27/2016   Typical symptoms -suspect      No detectable diabetic retinopathy 12-27-2020     Diabetic neuropathy (HCC) 11/25/2013   Diaphragmatic hernia 03/02/2008   Qualifier: Diagnosis of   By: Genie CMA LEODIS), Leisha      IMO SNOMED Dx Update Oct 2024     Diverticulitis of colon 10/14/2014   Diverticulosis    Dizziness 01/07/2014   Dyspnea on exertion 01/04/2023   Encounter for Medicare annual wellness exam 01/23/2014   ESOPHAGEAL STRICTURE 03/02/2008   Qualifier: Diagnosis of    By: Genie CMA (AAMA), Chick         Essential hypertension 08/22/2006   Qualifier: Diagnosis of   By: Cecilie CMA, NANNIE Ivy         Estrogen deficiency 01/23/2014   Eye pain 03/23/2023   Fatigue 05/20/2018   Fatty liver    Fever, low grade 09/24/2020   GAD (generalized anxiety disorder) 04/15/2007   Qualifier: Diagnosis of   By: Baird FNP, Frieda Kipper        GI bleed    Gout    Heart murmur    Hepatic steatosis    Hiatal hernia    Hip pain, bilateral 08/31/2017   History of breast cancer 08/02/2009   Qualifier: Diagnosis of   By: Randeen MD, Laine Caldron        History of CAD (coronary artery disease) 03/23/2023   History of COVID-19 09/24/2020   History of removal of implants of both breasts 07/01/2013   06/25/13 elected to have bilateral breast implants removed.     History of seizure 08/22/2006   Qualifier: Diagnosis of   By: Cecilie CMA, NANNIE Ivy         Hyperlipidemia    Hyperlipidemia associated with type 2 diabetes mellitus (HCC) 08/22/2006   Qualifier: Diagnosis of   By: Cecilie CMA, NANNIE Ivy         Hypertensive retinopathy of left eye 07/14/2019   Hypothyroidism    IBS (irritable bowel syndrome)    Lap Nissen October 2013 01/19/2012   Laparoscopic takedown of large type III mixed hiatus hernia with primary closure of the diaphragm with 3 sutures posteriorally (post 2 with pledgets) and Nissen fundoplication over a #56 lighted bougie        Large type III mixed hiatus hernia  12/06/2011   Plan surgery to repair diaphragm and do Nissen fundoplication     Leg pain, bilateral 07/25/2023   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 Type  not noted on medical history form.     Palpitations 01/04/2023   Peritoneal adhesions 03/02/2008   Qualifier: Diagnosis of   By: Genie CMA LEODIS), Chick      IMO SNOMED Dx Update Oct 2024     Pharyngitis 02/06/2024   Poor balance 01/30/2023   Poorly controlled type 2 diabetes mellitus with neuropathy (HCC) 08/19/2007   Qualifier: Diagnosis of   By: Randeen MD, Laine Caldron        Posterior vitreous detachment of right eye 11/06/2019   Postmenopausal hormone therapy    per patient medical history form.   Retinal vein occlusion, branch (HCC) 01/03/2016   Left with hemorrhage and edema (Dr Elner) 2017     Routine general medical examination at a health care facility 07/11/2011   Salmonella 05/2000   renal effects secondary to dehydration   Seizure disorder (HCC)    Seizures (HCC)    one seizure several yrs ago --etiology unknown-no problem since   Stress reaction 10/25/2011   SYNDROME, CARPAL TUNNEL 08/23/2006   Qualifier: Diagnosis of   By: Randeen MD, Laine Caldron        SYNDROME, RESTLESS LEGS 08/23/2006   Qualifier: Diagnosis of   By: Randeen MD, Laine Caldron        Thrombocytopenia 11/25/2013   TRANSAMINASES, SERUM, ELEVATED 11/11/2009   Qualifier: Diagnosis of   By: Randeen MD, Laine Caldron        Tremor 01/20/2014   Head and right hand      Vitamin B12 deficiency 09/21/2016   Vitreous floaters of left eye 07/14/2019   The nature of vitreous floaters was discussed with the patient as well as their frequent occurrence with development of posterior vitreous detachment. The significance of flashes and field cuts was discussed with the patient. The need for a dilated fundus examination was discussed and advice given to return immediately for new or different floaters .  More uncommonly, vitreous floaters, debris, or   Past Surgical History:  Procedure Laterality Date   ABDOMINAL HYSTERECTOMY  1983   BREAST SURGERY  02/2009   breast biopsy invasive ductal  CA-bilateral mastectomies   CORONARY PRESSURE/FFR STUDY N/A 03/27/2023   Procedure: CORONARY PRESSURE/FFR STUDY;  Surgeon: Wendel Lurena POUR, MD;  Location: [facility];  Service: Cardiovascular;  Laterality: N/A;   CORONARY STENT INTERVENTION N/A 03/08/2023   Procedure: CORONARY STENT INTERVENTION;  Surgeon: Verlin Lonni BIRCH, MD;  Location: [facility];  Service: Cardiovascular;  Laterality: N/A;   COSMETIC SURGERY     EPIGASTRIC HERNIA REPAIR  01/02/2012   Procedure: HERNIA REPAIR EPIGASTRIC ADULT;  Surgeon: Donnice KATHEE Lunger, MD;  Location: [facility];  Service: General;  Laterality: N/A;  Laparoscopic Repair Paraesophageal Hernia, Nissen   EYE SURGERY Left 07/2017   Dr. Elner   GANGLION CYST EXCISION     HERNIA REPAIR     Repair did not work.  It came undone.   LAPAROSCOPIC NISSEN FUNDOPLICATION  01/02/2012   Procedure: LAPAROSCOPIC NISSEN FUNDOPLICATION;  Surgeon: Donnice KATHEE Lunger, MD;  Location: [facility];  Service: General;  Laterality: N/A;   LEFT HEART CATH AND CORONARY ANGIOGRAPHY N/A 03/08/2023   Procedure: LEFT HEART CATH AND CORONARY ANGIOGRAPHY;  Surgeon: Verlin Lonni BIRCH, MD;  Location: [facility];  Service: Cardiovascular;  Laterality: N/A;   LEFT HEART CATH AND CORONARY ANGIOGRAPHY N/A 03/27/2023   Procedure: LEFT HEART CATH AND CORONARY ANGIOGRAPHY;  Surgeon: Wendel Lurena POUR, MD;  Location: [facility];  Service: Cardiovascular;  Laterality: N/A;   MASTECTOMY  04/2009   Bilateral for ductal carcinoma on L   OVARY SURGERY  1986   ovarian tumor removed   TUBAL LIGATION     Social History[1] Family History  Problem Relation Age of Onset   Hypertension Mother    Stroke Mother    Breast cancer Mother    Cancer Mother    Heart attack Father    Diabetes Father    Esophageal cancer Father    Cancer Father    Heart disease Father    Breast cancer Sister        x 2 sisters   Diabetes Sister    Arthritis Sister    Cancer Sister     Heart disease Sister    Kidney disease Sister    Hypertension Sister    Breast cancer Sister    Arthritis Sister    Cancer Sister    Diabetes Sister    Hypertension Brother    Diabetes Brother    Arthritis Brother    Arthritis Sister    Hyperlipidemia Sister    Hypertension Sister    Kidney disease Sister    Allergies[2] Medications Ordered Prior to Encounter[3]  Review of Systems     Objective:   Physical Exam        Assessment & Plan:   Problem List Items Addressed This Visit   None     [1]  Social History Tobacco Use   Smoking status: Never   Smokeless tobacco: Never  Vaping Use   Vaping status: Never Used  Substance Use Topics   Alcohol use: Yes    Comment: occasional   Drug use: Never  [2]  Allergies Allergen Reactions   Arimidex [Anastrozole] Other (See Comments)    Joint pain   Glipizide  Other (See Comments)    Hypoglycemia    Metformin  And Related Other (See Comments)    diarrhea   Penicillins Hives and Rash  [3]  Current Outpatient Medications on File Prior to Visit  Medication Sig Dispense Refill   aspirin  EC 81 MG tablet Take 1 tablet (81 mg total) by mouth daily. Swallow whole.     Dulaglutide  (TRULICITY ) 3 MG/0.5ML SOAJ INJECT 0.5 ML (3 MG) UNDER THE SKIN ONCE A WEEK 6 mL 3   empagliflozin  (JARDIANCE ) 25 MG TABS tablet TAKE 1 TABLET DAILY BEFORE BREAKFAST 90 tablet 1   ezetimibe  (ZETIA ) 10 MG tablet TAKE 1 TABLET DAILY 90 tablet 3   famotidine  (PEPCID ) 20 MG tablet TAKE 1 TABLET TWICE A DAY 180 tablet 1   FLUoxetine  (PROZAC ) 40 MG capsule TAKE 1 CAPSULE DAILY 90 capsule 1   fluticasone  (FLONASE ) 50 MCG/ACT nasal spray USE 2 SPRAYS IN EACH NOSTRIL DAILY AS NEEDED FOR ALLERGIES 48 g 0   isosorbide  mononitrate (IMDUR ) 30 MG 24 hr tablet Take 1 tablet (30 mg total) by mouth daily. 90 tablet 3   levothyroxine  (SYNTHROID ) 50 MCG tablet Take 1 tablet (50 mcg total) by mouth daily before breakfast. 90 tablet 3   losartan  (COZAAR ) 25 MG tablet  Take 1 tablet (25 mg total) by mouth daily. 90 tablet 1   metoprolol  succinate (TOPROL -XL) 25 MG 24 hr tablet Take 0.5 tablets (12.5 mg total) by mouth daily. 30 tablet 1   Multiple Vitamin (MULTIVITAMIN WITH MINERALS) TABS tablet Take 1 tablet by mouth at bedtime.     nitroGLYCERIN  (NITROSTAT ) 0.4 MG SL tablet Place 1 tablet (0.4 mg total) under the tongue every 5 (  five) minutes as needed for chest pain. 25 tablet 1   pantoprazole  (PROTONIX ) 20 MG tablet Take 20 mg by mouth daily.     repaglinide  (PRANDIN ) 1 MG tablet TAKE 1 TABLET DAILY BEFORE SUPPER 90 tablet 1   rOPINIRole  (REQUIP ) 1 MG tablet TAKE 1 TABLET AT BEDTIME 90 tablet 3   rosuvastatin  (CRESTOR ) 20 MG tablet Take 1 tablet (20 mg total) by mouth daily. 90 tablet 3   No current facility-administered medications on file prior to visit.   "

## 2024-03-31 ENCOUNTER — Inpatient Hospital Stay: Admitting: Family Medicine

## 2024-03-31 ENCOUNTER — Telehealth: Payer: Self-pay

## 2024-03-31 DIAGNOSIS — K760 Fatty (change of) liver, not elsewhere classified: Secondary | ICD-10-CM

## 2024-03-31 DIAGNOSIS — D696 Thrombocytopenia, unspecified: Secondary | ICD-10-CM

## 2024-03-31 DIAGNOSIS — K635 Polyp of colon: Secondary | ICD-10-CM

## 2024-03-31 DIAGNOSIS — K5790 Diverticulosis of intestine, part unspecified, without perforation or abscess without bleeding: Secondary | ICD-10-CM

## 2024-03-31 NOTE — Telephone Encounter (Signed)
"  ° °  Pre-operative Risk Assessment    Patient Name: Valerie Ball  DOB: 1945-08-12 MRN: [id number]   Date of last office visit: 03/28/2024 Date of next office visit: 05/09/2024   Request for Surgical Clearance    Procedure:  Colonoscopy  Date of Surgery:  Clearance 08/22/24                                Surgeon:  Dr. Caleen Surgeon's Group or Practice Name:  [facility] Phone number:  [phone] Fax number:  [phone]   Type of Clearance Requested:   - Pharmacy:  Hold Clopidogrel  (Plavix ) 5-7 prior   Type of Anesthesia:  Not Indicated   Additional requests/questions:    Bonney Calvert Pouch   03/31/2024, 10:42 AM   "

## 2024-03-31 NOTE — Transitions of Care (Post Inpatient/ED Visit) (Signed)
" ° °  03/31/2024  Name: Valerie Ball MRN: [id number] DOB: 03-25-1945  Today's TOC FU Call Status: Today's TOC FU Call Status:: Unsuccessful Call (3rd Attempt) Unsuccessful Call (3rd Attempt) Date: 03/31/24  Attempted to reach the patient regarding the most recent Inpatient/ED visit.  Follow Up Plan: No further outreach attempts will be made at this time. We have been unable to contact the patient.  Alan Ee, RN, BSN, CEN Population Health- Transition of Care Team.  [facility] [phone]  "

## 2024-03-31 NOTE — Telephone Encounter (Signed)
 We are unable to provide clearance for procedures > 2 months. Please resend clearance within 2 months of patient's scheduled procedure.   Rosaline EMERSON Bane, NP-C  03/31/2024, 10:54 AM [address], [address] [place], [place] [address] Office [phone] Fax [phone]

## 2024-04-04 ENCOUNTER — Encounter: Payer: Self-pay | Admitting: Family Medicine

## 2024-04-04 ENCOUNTER — Ambulatory Visit: Admitting: Family Medicine

## 2024-04-04 VITALS — BP 118/70 | HR 71 | Temp 98.5°F | Ht 63.0 in | Wt 139.1 lb

## 2024-04-04 DIAGNOSIS — D696 Thrombocytopenia, unspecified: Secondary | ICD-10-CM

## 2024-04-04 DIAGNOSIS — K7460 Unspecified cirrhosis of liver: Secondary | ICD-10-CM | POA: Diagnosis not present

## 2024-04-04 DIAGNOSIS — K317 Polyp of stomach and duodenum: Secondary | ICD-10-CM | POA: Diagnosis not present

## 2024-04-04 DIAGNOSIS — D500 Iron deficiency anemia secondary to blood loss (chronic): Secondary | ICD-10-CM | POA: Diagnosis not present

## 2024-04-04 DIAGNOSIS — K922 Gastrointestinal hemorrhage, unspecified: Secondary | ICD-10-CM

## 2024-04-04 DIAGNOSIS — K760 Fatty (change of) liver, not elsewhere classified: Secondary | ICD-10-CM | POA: Diagnosis not present

## 2024-04-04 DIAGNOSIS — K5790 Diverticulosis of intestine, part unspecified, without perforation or abscess without bleeding: Secondary | ICD-10-CM

## 2024-04-04 DIAGNOSIS — D509 Iron deficiency anemia, unspecified: Secondary | ICD-10-CM | POA: Insufficient documentation

## 2024-04-04 DIAGNOSIS — I10 Essential (primary) hypertension: Secondary | ICD-10-CM

## 2024-04-04 NOTE — Assessment & Plan Note (Signed)
 Noted on CT early this month during hosp for GI bleed LFTs have been normal  No symptoms  No etoh / past acetaminophen  (not current)   Last LFTs normal  Re check today Encouraged to avoid hepatotoxins   Will need follow up with GI

## 2024-04-04 NOTE — Assessment & Plan Note (Signed)
 Noted on CT Per pt -? Was treated for diverticulitis during recent hosp  Lab today  Reassuring exam

## 2024-04-04 NOTE — Assessment & Plan Note (Signed)
 Acute on chronic  In setting of recent GI bleed (plavix  held/continued 81 mg asa) Last plt 83  ? If from cirrhosis or other   Lab pending Will plan heme/onc follow up  Also gi

## 2024-04-04 NOTE — Assessment & Plan Note (Signed)
 Source of recent GI bleed and hosp  Reviewed hospital records, lab results and studies in detail   Cbc today  No loner has melena

## 2024-04-04 NOTE — Progress Notes (Signed)
 "  Subjective:    Patient ID: Valerie Ball, female    DOB: [birth year], 79 y.o.   MRN: [id number]  HPI  Wt Readings from Last 3 Encounters:  04/04/24 139 lb 2 oz (63.1 kg)  03/28/24 142 lb 8 oz (64.6 kg)  02/26/24 143 lb (64.9 kg)   24.64 kg/m  Vitals:   04/04/24 1147  BP: 118/70  Pulse: 71  Temp: 98.5 F (36.9 C)  SpO2: 97%     Pt presents for fu of hosp for GI bleed 03/23/24 [facility]   Did get a transfusion for hgb 7.8 EGD -pedunculated duodenal polyp -had polypectomy and clipping  Omeprazole  40 mg bid  Colonoscopy -poor prep -recommended follow up 1y  Per pt had diverticulitis - treated  Does have past history of avm of colon  Plavix  was dc  Recommended restart asa  Held blood pressure med  Held statin to resume on d/c Previously on amlodipine  up to 5 mg daily  Losartan  25 mg daily  Imdur  30 mg daily   Cardiology recommended early follow up with hematology /Dr Ezzard to discuss safety of continues asa in setting of chronic thrombocytopenia    From hosp Mag 1.8 Last bmp with gfr 65 Last cbc hgb of 9.3 and plt 83    Today  Tired Occational brb spotting -otherwise no more bleeding  No pain    May have picked up virus Little fever 100.2 -none now  Cough -occational little bit at night  Diarrhea Mild   CT   IMPRESSION:  1. No aneurysm or dissection of the abdominal aorta is noted. Atherosclerotic calcification involving the abdominal aorta and origins of bilateral renal arteries without significant stenosis.  2. Cirrhotic changes with hypertrophy of the left and caudate lobes of the liver.  3. Mild-to-moderate splenomegaly.  4. A 15 mm simple Bosniak type I cyst in the left kidney.  5. Colonic diverticulosis without evidence of diverticulitis.  6. Post-hysterectomy status.  7. Degenerative changes in the thoracolumbar spine.  8. Hiatal hernia noted.  9. No abnormal bowel enhancement or significant arterial stenosis  detected. Close Electronically Signed   Per pt-did have bleeding from diverticuli    Goes back to GI in June  They will colonoscopy and remove more polyps   Fatty liver  Lab Results  Component Value Date   ALT 11 01/10/2024   AST 21 01/10/2024   ALKPHOS 81 01/10/2024   BILITOT 0.7 01/10/2024   Fibrosis 4 Score = 3.94  Fib-4 interpretation is not validated for people under 35 or over 79 years of age. However, scores under 2.0 are generally considered low risk.   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   Pharyngitis 02/06/2024   Heart murmur 01/31/2024   Atherosclerotic heart disease 01/31/2024   Mild aortic regurgitation prior TTE November 2024 01/10/2024   Seizure disorder [facility])    Arthritis    Leg pain, bilateral 07/25/2023   CKD (chronic kidney disease) 04/02/2023   Eye pain 03/23/2023   History of CAD (  coronary artery disease) 03/23/2023   Chronic idiopathic thrombocytopenia (HCC) 03/23/2023   Mitral stenosis and aortic incompetence, by TTE 01/25/2023 02/01/2023   CAD (coronary artery disease)  CT Cardiac 01/19/2023 calcium  score 411, plaque volume 366, moderate stenosis LAD, mild stenosis D1, RCA, LCx/OM 3; S/p PCI of Mid LAD 03/08/2023;rpt cath 03/27/2023 stable 02/01/2023   Poor balance 01/30/2023   Dyspnea on exertion 01/04/2023   Palpitations 01/04/2023   Allergy    Angiodysplasia of colon    Anxiety    Arthritis of both knees    Borderline diabetic    Colon polyp    Diverticulosis    GI bleed    Hepatic steatosis    Hiatal hernia    Hyperlipidemia    Benign hypertension    Macular edema     Menopause    Postmenopausal hormone therapy    Seizures (HCC)    Chest pain 12/29/2022   Bilateral knee pain 05/11/2022   Decreased calculated GFR 05/11/2022   Blurred vision, right eye 08/11/2021   History of COVID-19 09/24/2020   Fever, low grade 09/24/2020   Posterior vitreous detachment of right eye 11/06/2019   Cystoid macular edema of left eye 07/14/2019   Branch retinal vein occlusion with macular edema of left eye (HCC) 07/14/2019   Hypertensive retinopathy of left eye 07/14/2019   Vitreous floaters of left eye 07/14/2019   Fatigue 05/20/2018   Hip pain, bilateral 08/31/2017   Vitamin B12 deficiency 09/21/2016   Diabetic gastroparesis (HCC) 03/27/2016   Retinal vein occlusion, branch (HCC) 01/03/2016   Alternating constipation and diarrhea 08/12/2015   AVM (arteriovenous malformation) of colon 05/18/2015   IBS (irritable bowel syndrome) 02/28/2015   Diverticulitis of colon 10/14/2014   Gout 07/22/2014   Hypothyroidism 04/23/2014   Encounter for Medicare annual wellness exam 01/23/2014   Estrogen deficiency 01/23/2014   Colon cancer screening 01/23/2014   Dizziness 01/07/2014   Allergic rhinitis 01/07/2014   Thrombocytopenia 11/25/2013   Diabetic neuropathy (HCC) 11/25/2013   History of removal of implants of both breasts 07/01/2013   Lap Nissen October 2013 01/19/2012   Large type III mixed hiatus hernia  12/06/2011   Stress reaction 10/25/2011   Routine general medical examination at a health care facility 07/11/2011   Non-insulin  treated type 2 diabetes mellitus (HCC) 07/25/2010   TRANSAMINASES, SERUM, ELEVATED 11/11/2009   History of breast cancer 08/02/2009   Breast cancer (HCC) 02/2009   ESOPHAGEAL STRICTURE 03/02/2008   Diaphragmatic hernia 03/02/2008   Peritoneal adhesions 03/02/2008   Poorly controlled type 2 diabetes mellitus with neuropathy (HCC) 08/19/2007   GAD (generalized anxiety disorder) 04/15/2007   DISORDERS, ORGANIC SLEEP NEC 08/23/2006    SYNDROME, RESTLESS LEGS 08/23/2006   SYNDROME, CARPAL TUNNEL 08/23/2006   Hyperlipidemia associated with type 2 diabetes mellitus (HCC) 08/22/2006   Essential hypertension 08/22/2006   Allergic rhinitis 08/22/2006   Fatty liver 08/22/2006   History of seizure 08/22/2006   Colon stricture (HCC) 01/14/2003   Salmonella 05/2000   Past Medical History:  Diagnosis Date   Allergic rhinitis 08/22/2006   Qualifier: Diagnosis of   By: Cecilie CMA, NANNIE Ivy      IMO SNOMED Dx Update Oct 2024     Allergy    Alternating constipation and diarrhea 08/12/2015   Anemia    Angiodysplasia of colon    Anxiety    Arthritis    Arthritis of both knees    Atherosclerotic heart disease 01/31/2024   AVM (arteriovenous malformation) of colon  Benign hypertension    Bilateral knee pain 05/11/2022   Blood transfusion without reported diagnosis    Blurred vision, right eye 08/11/2021   Patient does have blurred vision right eye,     Borderline diabetic    per patient medical history form   Branch retinal vein occlusion with macular edema of left eye (HCC) 07/14/2019   CME superior to the fovea OS has worsened at 7-week interval from macular branch retinal vein occlusion, repeat Avastin  today and evaluate exam in 6 weeks     Breast cancer (HCC) 02/2009   breast CA L invasive ductal CA(hormone receptor positive.)   CAD (coronary artery disease)  CT Cardiac 01/19/2023 calcium  score 411, plaque volume 366, moderate stenosis LAD, mild stenosis D1, RCA, LCx/OM 3 02/01/2023   Cataract    Chest pain 12/29/2022   Chronic idiopathic thrombocytopenia (HCC) 03/23/2023   CKD (chronic kidney disease) 04/02/2023   Clotting disorder    Colon cancer screening 01/23/2014   Colon polyp    hyperplastic   Colon stricture (HCC) 01/14/2003   Cystoid macular edema of left eye 07/14/2019   Decreased calculated GFR 05/11/2022   Depression    Diabetic gastroparesis (HCC) 03/27/2016   Typical symptoms -suspect      No  detectable diabetic retinopathy 12-27-2020     Diabetic neuropathy (HCC) 11/25/2013   Diaphragmatic hernia 03/02/2008   Qualifier: Diagnosis of   By: Genie CMA LEODIS), Leisha      IMO SNOMED Dx Update Oct 2024     Diverticulitis of colon 10/14/2014   Diverticulosis    Dizziness 01/07/2014   Dyspnea on exertion 01/04/2023   Encounter for Medicare annual wellness exam 01/23/2014   ESOPHAGEAL STRICTURE 03/02/2008   Qualifier: Diagnosis of   By: Genie CMA (AAMA), Chick         Essential hypertension 08/22/2006   Qualifier: Diagnosis of   By: Cecilie CMA, NANNIE Ivy         Estrogen deficiency 01/23/2014   Eye pain 03/23/2023   Fatigue 05/20/2018   Fatty liver    Fever, low grade 09/24/2020   GAD (generalized anxiety disorder) 04/15/2007   Qualifier: Diagnosis of   By: Baird FNP, Frieda Kipper        GERD (gastroesophageal reflux disease)    GI bleed    Gout    Heart murmur    Hepatic steatosis    Hiatal hernia    Hip pain, bilateral 08/31/2017   History of breast cancer 08/02/2009   Qualifier: Diagnosis of   By: Randeen MD, Laine Caldron        History of CAD (coronary artery disease) 03/23/2023   History of COVID-19 09/24/2020   History of removal of implants of both breasts 07/01/2013   06/25/13 elected to have bilateral breast implants removed.     History of seizure 08/22/2006   Qualifier: Diagnosis of   By: Cecilie CMA, NANNIE Ivy         Hyperlipidemia    Hyperlipidemia associated with type 2 diabetes mellitus (HCC) 08/22/2006   Qualifier: Diagnosis of   By: Cecilie CMA, NANNIE Ivy         Hypertensive retinopathy of left eye 07/14/2019   Hypothyroidism    IBS (irritable bowel syndrome)    Lap Nissen October 2013 01/19/2012   Laparoscopic takedown of large type III mixed hiatus hernia with primary closure of the diaphragm with 3 sutures posteriorally (post 2 with pledgets) and Nissen fundoplication over a #56 lighted bougie  Large type III mixed hiatus hernia   12/06/2011   Plan surgery to repair diaphragm and do Nissen fundoplication     Leg pain, bilateral 07/25/2023   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 Type not noted on medical history form.     Palpitations 01/04/2023   Peritoneal adhesions 03/02/2008   Qualifier: Diagnosis of   By: Genie CMA LEODIS), Chick      IMO SNOMED Dx Update Oct 2024     Pharyngitis 02/06/2024   Poor balance 01/30/2023   Poorly controlled type 2 diabetes mellitus with neuropathy (HCC) 08/19/2007   Qualifier: Diagnosis of   By: Randeen MD, Laine Caldron        Posterior vitreous detachment of right eye 11/06/2019   Postmenopausal hormone therapy    per patient medical history form.   Retinal vein occlusion, branch (HCC) 01/03/2016   Left with hemorrhage and edema (Dr Elner) 2017     Routine general medical examination at a health care facility 07/11/2011   Salmonella 05/2000   renal effects secondary to dehydration   Seizure disorder (HCC)    Seizures (HCC)    one seizure several yrs ago --etiology unknown-no problem since   Stress reaction 10/25/2011   SYNDROME, CARPAL TUNNEL 08/23/2006   Qualifier: Diagnosis of   By: Randeen MD, Laine Caldron        SYNDROME, RESTLESS LEGS 08/23/2006   Qualifier: Diagnosis of   By: Randeen MD, Laine Caldron        Thrombocytopenia 11/25/2013   TRANSAMINASES, SERUM, ELEVATED 11/11/2009   Qualifier: Diagnosis of   By: Randeen MD, Laine Caldron        Tremor 01/20/2014   Head and right hand      Vitamin B12 deficiency 09/21/2016   Vitreous floaters of left eye 07/14/2019   The nature of vitreous floaters was discussed with the  patient as well as their frequent occurrence with development of posterior vitreous detachment. The significance of flashes and field cuts was discussed with the patient. The need for a dilated fundus examination was discussed and advice given to return immediately for new or different floaters .  More uncommonly, vitreous floaters, debris, or   Past Surgical History:  Procedure Laterality Date   ABDOMINAL HYSTERECTOMY  1983   BREAST SURGERY  02/2009   breast biopsy invasive ductal CA-bilateral mastectomies   CORONARY PRESSURE/FFR STUDY N/A 03/27/2023   Procedure: CORONARY PRESSURE/FFR STUDY;  Surgeon: Wendel Lurena POUR, MD;  Location: [facility];  Service: Cardiovascular;  Laterality: N/A;   CORONARY STENT INTERVENTION N/A 03/08/2023   Procedure: CORONARY STENT INTERVENTION;  Surgeon: Verlin Lonni BIRCH, MD;  Location: [facility];  Service: Cardiovascular;  Laterality: N/A;   COSMETIC SURGERY     EPIGASTRIC HERNIA REPAIR  01/02/2012   Procedure: HERNIA REPAIR EPIGASTRIC ADULT;  Surgeon: Donnice KATHEE Lunger, MD;  Location: [facility];  Service: General;  Laterality: N/A;  Laparoscopic Repair Paraesophageal Hernia, Nissen   EYE SURGERY Left 07/2017   Dr. Elner   GANGLION CYST EXCISION     HERNIA REPAIR  Repair did not work.  It came undone.   LAPAROSCOPIC NISSEN FUNDOPLICATION  01/02/2012   Procedure: LAPAROSCOPIC NISSEN FUNDOPLICATION;  Surgeon: Donnice KATHEE Lunger, MD;  Location: [facility];  Service: General;  Laterality: N/A;   LEFT HEART CATH AND CORONARY ANGIOGRAPHY N/A 03/08/2023   Procedure: LEFT HEART CATH AND CORONARY ANGIOGRAPHY;  Surgeon: Verlin Lonni BIRCH, MD;  Location: [facility];  Service: Cardiovascular;  Laterality: N/A;   LEFT HEART CATH AND CORONARY ANGIOGRAPHY N/A 03/27/2023   Procedure: LEFT HEART CATH AND CORONARY ANGIOGRAPHY;  Surgeon: Wendel Lurena POUR, MD;  Location: [facility];  Service: Cardiovascular;  Laterality: N/A;   MASTECTOMY   04/2009   Bilateral for ductal carcinoma on L   OVARY SURGERY  1986   ovarian tumor removed   TUBAL LIGATION     Social History[1] Family History  Problem Relation Age of Onset   Hypertension Mother    Stroke Mother    Breast cancer Mother    Cancer Mother    Heart attack Father    Diabetes Father    Esophageal cancer Father    Cancer Father    Heart disease Father    Breast cancer Sister        x 2 sisters   Diabetes Sister    Arthritis Sister    Cancer Sister    Heart disease Sister    Kidney disease Sister    Hypertension Sister    Breast cancer Sister    Arthritis Sister    Cancer Sister    Diabetes Sister    Hypertension Brother    Diabetes Brother    Arthritis Brother    Arthritis Sister    Hyperlipidemia Sister    Hypertension Sister    Kidney disease Sister    Allergies[2] Medications Ordered Prior to Encounter[3]  Review of Systems  Constitutional:  Positive for fatigue. Negative for activity change, appetite change, fever and unexpected weight change.  HENT:  Negative for congestion, ear pain, rhinorrhea, sinus pressure and sore throat.   Eyes:  Negative for pain, redness and visual disturbance.  Respiratory:  Negative for cough, shortness of breath and wheezing.   Cardiovascular:  Negative for chest pain and palpitations.  Gastrointestinal:  Positive for anal bleeding. Negative for abdominal pain, blood in stool, constipation and diarrhea.       Occational brb from rectum but much improved   Endocrine: Negative for polydipsia and polyuria.  Genitourinary:  Negative for dysuria, frequency and urgency.  Musculoskeletal:  Negative for arthralgias, back pain and myalgias.  Skin:  Negative for pallor and rash.  Allergic/Immunologic: Negative for environmental allergies.  Neurological:  Negative for dizziness, syncope and headaches.  Hematological:  Negative for adenopathy. Does not bruise/bleed easily.  Psychiatric/Behavioral:  Negative for decreased  concentration and dysphoric mood. The patient is not nervous/anxious.        High stress Cares for husband       Objective:   Physical Exam Constitutional:      General: She is not in acute distress.    Appearance: Normal appearance. She is well-developed and normal weight. She is not ill-appearing or diaphoretic.  HENT:     Head: Normocephalic and atraumatic.     Mouth/Throat:     Mouth: Mucous membranes are moist.     Pharynx: Oropharynx is clear.  Eyes:     General: No scleral icterus.       Right eye: No discharge.  Left eye: No discharge.     Pupils: Pupils are equal, round, and reactive to light.     Comments: Conj pallor  Neck:     Thyroid : No thyromegaly.     Vascular: No carotid bruit or JVD.  Cardiovascular:     Rate and Rhythm: Normal rate and regular rhythm.     Heart sounds: Murmur heard.     No gallop.  Pulmonary:     Effort: Pulmonary effort is normal. No respiratory distress.     Breath sounds: Normal breath sounds. No stridor. No wheezing, rhonchi or rales.  Abdominal:     General: Abdomen is protuberant. There is no distension or abdominal bruit.     Palpations: Abdomen is soft. There is no hepatomegaly or splenomegaly.     Tenderness: There is abdominal tenderness in the suprapubic area and left lower quadrant. There is no right CVA tenderness, left CVA tenderness, guarding or rebound. Negative signs include Murphy's sign and McBurney's sign.     Comments: Very slight LLQ/ suprapubic tenderness  Musculoskeletal:     Cervical back: Normal range of motion and neck supple.     Right lower leg: No edema.     Left lower leg: No edema.  Lymphadenopathy:     Cervical: No cervical adenopathy.  Skin:    General: Skin is warm and dry.     Coloration: Skin is pale.     Findings: No rash.     Comments: Sallow complesion/more pale than baseline  Neurological:     Mental Status: She is alert.     Coordination: Coordination normal.     Deep Tendon  Reflexes: Reflexes are normal and symmetric. Reflexes normal.  Psychiatric:        Mood and Affect: Mood normal.     Comments: Candidly discusses symptoms and stressors  Seems fatigued            Assessment & Plan:   Problem List Items Addressed This Visit       Cardiovascular and Mediastinum   Essential hypertension   Blood pressure meds [facility] at [facility] when hosp for GI bleed  Metoprolol  xl 12.5 mg daily  Imdur  30 mg daily  Losartan  25 mg daily (? Not on med list but pt thinks she is taking)  Continues to hold amlodipine     Blood pressure stable today BP: 118/70        Relevant Orders   Basic metabolic panel with GFR   CBC with Differential/Platelet   Hepatic function panel     Digestive   Polyp of duodenum   Source of recent GI bleed and hosp  Reviewed hospital records, lab results and studies in detail   Cbc today  No loner has melena       Hepatic steatosis   Relevant Orders   Hepatic function panel   GI bleed - Primary   Recent hosp with [facility]  Presented with hgb of 7.8  Reviewed hospital records, lab results and studies in detail   Transfusion  Duodenal polyp (source of bleeding) removed and clipped  ? If diverticular bleed also       Fatty liver   Suspect cause of her cirrhosis  LFTs normal  Fibrosis 4 Score = 3.94  Fib-4 interpretation is not validated for people under 35 or over 69 years of age. However, scores under 2.0 are generally considered low risk.       Diverticulosis   Noted on CT Per pt -? Was  treated for diverticulitis during recent hosp  Lab today  Reassuring exam      Cirrhosis of liver (HCC)   Noted on CT early this month during hosp for GI bleed LFTs have been normal  No symptoms  No etoh / past acetaminophen  (not current)   Last LFTs normal  Re check today Encouraged to avoid hepatotoxins   Will need follow up with GI      Relevant Orders   Hepatic function panel     Hematopoietic and Hemostatic    Thrombocytopenia   Acute on chronic  In setting of recent GI bleed (plavix  held/continued 81 mg asa) Last plt 83  ? If from cirrhosis or other   Lab pending Will plan heme/onc follow up  Also gi      Relevant Orders   CBC with Differential/Platelet     Other   Iron  deficiency anemia   S/p GI bleed Reviewed hospital records, lab results and studies in detail    Cbc and iron  today  Will likely start on oral iron        Relevant Orders   CBC with Differential/Platelet   Ferritin   Iron       [1]  Social History Tobacco Use   Smoking status: Never   Smokeless tobacco: Never  Vaping Use   Vaping status: Never Used  Substance Use Topics   Alcohol use: Not Currently    Comment: occasional   Drug use: Never  [2]  Allergies Allergen Reactions   Arimidex [Anastrozole] Other (See Comments)    Joint pain   Glipizide  Other (See Comments)    Hypoglycemia    Metformin  And Related Other (See Comments)    diarrhea   Penicillins Hives and Rash  [3]  Current Outpatient Medications on File Prior to Visit  Medication Sig Dispense Refill   Dulaglutide  (TRULICITY ) 3 MG/0.5ML SOAJ INJECT 0.5 ML (3 MG) UNDER THE SKIN ONCE A WEEK 6 mL 3   empagliflozin  (JARDIANCE ) 25 MG TABS tablet TAKE 1 TABLET DAILY BEFORE BREAKFAST 90 tablet 1   ezetimibe  (ZETIA ) 10 MG tablet TAKE 1 TABLET DAILY 90 tablet 3   FLUoxetine  (PROZAC ) 40 MG capsule TAKE 1 CAPSULE DAILY 90 capsule 1   fluticasone  (FLONASE ) 50 MCG/ACT nasal spray USE 2 SPRAYS IN EACH NOSTRIL DAILY AS NEEDED FOR ALLERGIES 48 g 0   isosorbide  mononitrate (IMDUR ) 30 MG 24 hr tablet Take 1 tablet (30 mg total) by mouth daily. 90 tablet 3   levothyroxine  (SYNTHROID ) 50 MCG tablet Take 1 tablet (50 mcg total) by mouth daily before breakfast. 90 tablet 3   metoprolol  succinate (TOPROL -XL) 25 MG 24 hr tablet Take 0.5 tablets (12.5 mg total) by mouth daily. 30 tablet 1   Multiple Vitamin (MULTIVITAMIN WITH MINERALS) TABS tablet Take 1 tablet by  mouth at bedtime.     nitroGLYCERIN  (NITROSTAT ) 0.4 MG SL tablet Place 1 tablet (0.4 mg total) under the tongue every 5 (five) minutes as needed for chest pain. 25 tablet 1   pantoprazole  (PROTONIX ) 20 MG tablet Take 20 mg by mouth daily.     repaglinide  (PRANDIN ) 1 MG tablet TAKE 1 TABLET DAILY BEFORE SUPPER 90 tablet 1   rOPINIRole  (REQUIP ) 1 MG tablet TAKE 1 TABLET AT BEDTIME 90 tablet 3   rosuvastatin  (CRESTOR ) 20 MG tablet Take 1 tablet (20 mg total) by mouth daily. 90 tablet 3   No current facility-administered medications on file prior to visit.   "

## 2024-04-04 NOTE — Patient Instructions (Signed)
 Lab today   We will make a plan based on results   Stay well hydrated   If symptoms worsen (fatigue/cough) or if bleeding resumes please let us  know

## 2024-04-04 NOTE — Assessment & Plan Note (Signed)
 S/p GI bleed Reviewed hospital records, lab results and studies in detail    Cbc and iron  today  Will likely start on oral iron 

## 2024-04-04 NOTE — Assessment & Plan Note (Signed)
 Suspect cause of her cirrhosis  LFTs normal  Fibrosis 4 Score = 3.94  Fib-4 interpretation is not validated for people under 35 or over 77 years of age. However, scores under 2.0 are generally considered low risk.

## 2024-04-04 NOTE — Assessment & Plan Note (Signed)
 Recent hosp with [facility]  Presented with hgb of 7.8  Reviewed hospital records, lab results and studies in detail   Transfusion  Duodenal polyp (source of bleeding) removed and clipped  ? If diverticular bleed also

## 2024-04-04 NOTE — Assessment & Plan Note (Addendum)
 Blood pressure meds were held at [facility] when hosp for GI bleed  Metoprolol  xl 12.5 mg daily  Imdur  30 mg daily  Losartan  25 mg daily (? Not on med list but pt thinks she is taking)  Continues to hold amlodipine     Blood pressure stable today BP: 118/70

## 2024-04-05 LAB — CBC WITH DIFFERENTIAL/PLATELET
Absolute Lymphocytes: 1182 {cells}/uL (ref 850–3900)
Absolute Monocytes: 399 {cells}/uL (ref 200–950)
Basophils Absolute: 11 {cells}/uL (ref 0–200)
Basophils Relative: 0.3 %
Eosinophils Absolute: 49 {cells}/uL (ref 15–500)
Eosinophils Relative: 1.3 %
HCT: 28.4 % — ABNORMAL LOW (ref 35.9–46.0)
Hemoglobin: 8.8 g/dL — ABNORMAL LOW (ref 11.7–15.5)
MCH: 27.7 pg (ref 27.0–33.0)
MCHC: 31 g/dL — ABNORMAL LOW (ref 31.6–35.4)
MCV: 89.3 fL (ref 81.4–101.7)
MPV: 12.3 fL (ref 7.5–12.5)
Monocytes Relative: 10.5 %
Neutro Abs: 2158 {cells}/uL (ref 1500–7800)
Neutrophils Relative %: 56.8 %
Platelets: 136 Thousand/uL — ABNORMAL LOW (ref 140–400)
RBC: 3.18 Million/uL — ABNORMAL LOW (ref 3.80–5.10)
RDW: 14.5 % (ref 11.0–15.0)
Total Lymphocyte: 31.1 %
WBC: 3.8 Thousand/uL (ref 3.8–10.8)

## 2024-04-05 LAB — BASIC METABOLIC PANEL WITH GFR
BUN: 13 mg/dL (ref 7–25)
CO2: 27 mmol/L (ref 20–32)
Calcium: 8.4 mg/dL — ABNORMAL LOW (ref 8.6–10.4)
Chloride: 110 mmol/L (ref 98–110)
Creat: 0.93 mg/dL (ref 0.60–1.00)
Glucose, Bld: 159 mg/dL — ABNORMAL HIGH (ref 65–99)
Potassium: 3.8 mmol/L (ref 3.5–5.3)
Sodium: 145 mmol/L (ref 135–146)
eGFR: 64 mL/min/1.73m2

## 2024-04-05 LAB — HEPATIC FUNCTION PANEL
AG Ratio: 1.8 (calc) (ref 1.0–2.5)
ALT: 16 U/L (ref 6–29)
AST: 21 U/L (ref 10–35)
Albumin: 3.9 g/dL (ref 3.6–5.1)
Alkaline phosphatase (APISO): 68 U/L (ref 37–153)
Bilirubin, Direct: 0.1 mg/dL (ref 0.0–0.2)
Globulin: 2.2 g/dL (ref 1.9–3.7)
Indirect Bilirubin: 0.3 mg/dL (ref 0.2–1.2)
Total Bilirubin: 0.4 mg/dL (ref 0.2–1.2)
Total Protein: 6.1 g/dL (ref 6.1–8.1)

## 2024-04-05 LAB — IRON: Iron: 28 ug/dL — ABNORMAL LOW (ref 45–160)

## 2024-04-05 LAB — FERRITIN: Ferritin: 10 ng/mL — ABNORMAL LOW (ref 16–288)

## 2024-04-06 ENCOUNTER — Ambulatory Visit: Payer: Self-pay | Admitting: Family Medicine

## 2024-04-06 DIAGNOSIS — K922 Gastrointestinal hemorrhage, unspecified: Secondary | ICD-10-CM

## 2024-04-06 MED ORDER — POLYSACCHARIDE IRON COMPLEX 150 MG PO CAPS: 150.0000 mg | ORAL_CAPSULE | Freq: Every day | ORAL | 0 refills | Status: AC

## 2024-04-07 ENCOUNTER — Encounter: Payer: Self-pay | Admitting: Oncology

## 2024-04-09 ENCOUNTER — Encounter: Payer: Self-pay | Admitting: Family Medicine

## 2024-04-10 ENCOUNTER — Encounter: Payer: Self-pay | Admitting: Family Medicine

## 2024-04-10 NOTE — Telephone Encounter (Addendum)
 PC  with patient she stated no visible blood  but hgb falling, see labs from PCP s/s of anemia all yes to  weakness,sob ,dizziness, no pain stools not black , gets  dizzy and unsure ever since came home from the hospital  with  activities,  no cp this has subsided  per Dr Caleen stated ED d/t weakness .Patient advised.

## 2024-04-16 ENCOUNTER — Telehealth: Payer: Self-pay | Admitting: *Deleted

## 2024-04-16 ENCOUNTER — Encounter: Payer: Self-pay | Admitting: Family Medicine

## 2024-04-16 NOTE — Telephone Encounter (Signed)
 The chest/abdomen pain are new-please have someone triage I am reassured she has onc appointment on Friday   Thanks

## 2024-04-16 NOTE — Telephone Encounter (Signed)
-----   Message from Laine Balls, MD sent at 04/16/2024  8:57 AM EST ----- Please check on her-how is she feeling?    (Watching anemia currently) Thanks

## 2024-04-16 NOTE — Telephone Encounter (Signed)
 Left VM requesting pt to call the office back or send a mychart message with an update on how pt's doing

## 2024-04-16 NOTE — Telephone Encounter (Signed)
 Pt sent a mychart with an update, please see mychart message

## 2024-04-17 ENCOUNTER — Other Ambulatory Visit: Payer: Self-pay | Admitting: Oncology

## 2024-04-17 DIAGNOSIS — D696 Thrombocytopenia, unspecified: Secondary | ICD-10-CM

## 2024-04-17 NOTE — Telephone Encounter (Signed)
 I spoke with pt; pt said for a while has had lightheadedness on and off; pt said still feels weak and tired. Pt has not seen any blood. Pt said 04/16/24 had mild dull CP that did not radiate anywhere.pt said the abd pain is mid upper abd like the hiatal hernia pain that she has. Pt said last CP was last night. Pt does continue with SOB upon exertion. Pt said she had heart attack last year but saw card earlier this month., pt said last night had nausea and dry heaves. Pt does have H/A with pain level of 3- 4 but pt said she thinks that is due to sinus. Pt notified of Dr Graham comments in this note and pt voiced understanding. Pt lives in [place] and husband is with pt; pt said she could go to [facility] or [facility] ED but pt said she has seen so many doctors and has appt with oncologist tomorrow. Advised pt oncologist may not take care of CP and if pt would not go to UC or ED offered pt appt at [facility]  and pt said she would wait and see oncologist on 04/18/24. UC & ED precautions given again and pt voiced understanding and appreciated call. Sending note to Dr Randeen and [employer] and will teams [place] CMA.,

## 2024-04-17 NOTE — Telephone Encounter (Signed)
 Aware-thanks  See oncology as planned (suspect will to labs but not sure)  Agree with ER/ UC precautions  Her new symptoms are concerning   Please check in with her tomorrow after her onc appointment

## 2024-04-17 NOTE — Progress Notes (Signed)
 " [facility] [address] [place],  [place]  [address] [phone]  Clinic Day:  04/18/2024  Referring physician: Tower, Laine LABOR, MD   HISTORY OF PRESENT ILLNESS:  Valerie Ball is a 79 y.o. female with mild thrombocytopenia. She comes in today to reassess her thrombocytopenia.  Of note, recent outside labs show her to have a low hemoglobin of 8.8, with iron  parameters showing evidence of iron  deficiency anemia.  The patient denies having any overt forms of blood loss.  However, she has been taking oral iron  over these past few weeks.  As it pertains to her thrombocytopenia, she denies having any spontaneous bleeding/bruising issues which concern her for a severe decline in her platelets.    VITALS:  Blood pressure 139/68, pulse 76, temperature 98.3 F (36.8 C), temperature source Oral, resp. rate 16, height 5' 3 (1.6 m), weight 139 lb 12.8 oz (63.4 kg), last menstrual period 03/20/1981, SpO2 97%.  Wt Readings from Last 3 Encounters:  04/18/24 139 lb 12.8 oz (63.4 kg)  04/04/24 139 lb 2 oz (63.1 kg)  03/28/24 142 lb 8 oz (64.6 kg)    Body mass index is 24.76 kg/m.  Performance status (ECOG): 1 - Symptomatic but completely ambulatory  PHYSICAL EXAM:  Physical Exam Constitutional:      General: She is not in acute distress.    Appearance: Normal appearance. She is normal weight. She is not ill-appearing, toxic-appearing or diaphoretic.  HENT:     Head: Normocephalic and atraumatic.     Right Ear: Tympanic membrane normal.     Left Ear: Tympanic membrane normal.     Nose: Nose normal. No congestion or rhinorrhea.     Mouth/Throat:     Mouth: Mucous membranes are moist.     Pharynx: Oropharynx is clear. No oropharyngeal exudate or posterior oropharyngeal erythema.  Eyes:     General: No scleral icterus.       Right eye: No discharge.        Left eye: No discharge.     Extraocular Movements: Extraocular movements intact.      Conjunctiva/sclera: Conjunctivae normal.     Pupils: Pupils are equal, round, and reactive to light.  Neck:     Vascular: No carotid bruit.  Cardiovascular:     Rate and Rhythm: Normal rate and regular rhythm.     Heart sounds: No murmur heard.    No friction rub. No gallop.  Pulmonary:     Effort: Pulmonary effort is normal. No respiratory distress.     Breath sounds: Normal breath sounds. No stridor. No wheezing, rhonchi or rales.  Chest:     Chest wall: No tenderness.  Abdominal:     General: Abdomen is flat. Bowel sounds are normal. There is no distension.     Palpations: There is no mass.     Tenderness: There is no abdominal tenderness. There is no right CVA tenderness, left CVA tenderness, guarding or rebound.     Hernia: No hernia is present.  Musculoskeletal:        General: No swelling, tenderness, deformity or signs of injury. Normal range of motion.     Cervical back: Normal range of motion and neck supple. No rigidity or tenderness.     Right lower leg: No edema.     Left lower leg: No edema.  Lymphadenopathy:     Cervical: No cervical adenopathy.  Skin:    General: Skin is warm and dry.  Capillary Refill: Capillary refill takes less than 2 seconds.     Coloration: Skin is not jaundiced or pale.     Findings: No bruising, erythema, lesion or rash.  Neurological:     General: No focal deficit present.     Mental Status: She is alert and oriented to person, place, and time. Mental status is at baseline.     Cranial Nerves: No cranial nerve deficit.     Sensory: No sensory deficit.     Motor: No weakness.     Coordination: Coordination normal.     Gait: Gait normal.     Deep Tendon Reflexes: Reflexes normal.  Psychiatric:        Mood and Affect: Mood normal.        Behavior: Behavior normal.        Thought Content: Thought content normal.        Judgment: Judgment normal.    LABS:      Latest Ref Rng & Units 04/18/2024    2:13 PM 04/04/2024   12:25 PM  02/06/2024    2:32 PM  CBC  WBC 4.0 - 10.5 K/uL 4.9  3.8  5.4   Hemoglobin 12.0 - 15.0 g/dL 89.7  8.8  87.2   Hematocrit 36.0 - 46.0 % 32.6  28.4  38.5   Platelets 150 - 400 K/uL 145  136  122.0       Latest Ref Rng & Units 04/18/2024    2:13 PM 04/04/2024   12:25 PM 02/06/2024    2:32 PM  CMP  Glucose 70 - 99 mg/dL 868  840  765   BUN 8 - 23 mg/dL 17  13  19    Creatinine 0.44 - 1.00 mg/dL 9.02  9.06  8.91   Sodium 135 - 145 mmol/L 143  145  140   Potassium 3.5 - 5.1 mmol/L 3.8  3.8  3.7   Chloride 98 - 111 mmol/L 105  110  103   CO2 22 - 32 mmol/L 25  27  25    Calcium  8.9 - 10.3 mg/dL 9.4  8.4  9.1   Total Protein 6.5 - 8.1 g/dL 7.4  6.1    Total Bilirubin 0.0 - 1.2 mg/dL 0.6  0.4    Alkaline Phos 38 - 126 U/L 74     AST 15 - 41 U/L 37  21    ALT 0 - 44 U/L 13  16      Latest Reference Range & Units 04/18/24 14:13  Iron  28 - 170 ug/dL 36  UIBC ug/dL 518  TIBC 749 - 549 ug/dL 482 (H)  Saturation Ratios 10.4 - 31.8 % 7 (L)  Ferritin 11 - 307 ng/mL 12  Folate >5.9 ng/mL 13.0  (H): Data is abnormally high (L): Data is abnormally low  ASSESSMENT & PLAN:  A 79 y.o. female with mild thrombocytopenia.  I am pleased that her platelet count has progressively risen over time.  However, this patient's hemoglobin is lower.  Furthermore, iron  parameters today show she is iron  deficient.  Based upon this, I will arrange for her to receive IV iron  over these next few weeks to rapidly replenish her iron  stores and improve her hemoglobin.  I will see her back in 3 months to see how well she responded to her upcoming IV iron .  The patient understands all the plans discussed today and is in agreement with them.  Virgilio Broadhead DELENA Kerns, MD       "

## 2024-04-18 ENCOUNTER — Inpatient Hospital Stay: Admitting: Oncology

## 2024-04-18 ENCOUNTER — Other Ambulatory Visit: Payer: Self-pay | Admitting: Oncology

## 2024-04-18 ENCOUNTER — Inpatient Hospital Stay: Attending: Oncology

## 2024-04-18 VITALS — BP 139/68 | HR 76 | Temp 98.3°F | Resp 16 | Ht 63.0 in | Wt 139.8 lb

## 2024-04-18 DIAGNOSIS — D508 Other iron deficiency anemias: Secondary | ICD-10-CM

## 2024-04-18 DIAGNOSIS — D500 Iron deficiency anemia secondary to blood loss (chronic): Secondary | ICD-10-CM

## 2024-04-18 DIAGNOSIS — Z79899 Other long term (current) drug therapy: Secondary | ICD-10-CM | POA: Diagnosis not present

## 2024-04-18 DIAGNOSIS — E611 Iron deficiency: Secondary | ICD-10-CM | POA: Diagnosis not present

## 2024-04-18 DIAGNOSIS — D509 Iron deficiency anemia, unspecified: Secondary | ICD-10-CM | POA: Diagnosis not present

## 2024-04-18 DIAGNOSIS — D696 Thrombocytopenia, unspecified: Secondary | ICD-10-CM | POA: Diagnosis present

## 2024-04-18 LAB — CBC WITH DIFFERENTIAL (CANCER CENTER ONLY)
Abs Immature Granulocytes: 0.02 10*3/uL (ref 0.00–0.07)
Basophils Absolute: 0 10*3/uL (ref 0.0–0.1)
Basophils Relative: 0 %
Eosinophils Absolute: 0.1 10*3/uL (ref 0.0–0.5)
Eosinophils Relative: 1 %
HCT: 32.6 % — ABNORMAL LOW (ref 36.0–46.0)
Hemoglobin: 10.2 g/dL — ABNORMAL LOW (ref 12.0–15.0)
Immature Granulocytes: 0 %
Lymphocytes Relative: 28 %
Lymphs Abs: 1.4 10*3/uL (ref 0.7–4.0)
MCH: 26.2 pg (ref 26.0–34.0)
MCHC: 31.3 g/dL (ref 30.0–36.0)
MCV: 83.6 fL (ref 80.0–100.0)
Monocytes Absolute: 0.5 10*3/uL (ref 0.1–1.0)
Monocytes Relative: 10 %
Neutro Abs: 2.9 10*3/uL (ref 1.7–7.7)
Neutrophils Relative %: 61 %
Platelet Count: 145 10*3/uL — ABNORMAL LOW (ref 150–400)
RBC: 3.9 MIL/uL (ref 3.87–5.11)
RDW: 15 % (ref 11.5–15.5)
WBC Count: 4.9 10*3/uL (ref 4.0–10.5)
nRBC: 0 % (ref 0.0–0.2)

## 2024-04-18 LAB — CMP (CANCER CENTER ONLY)
ALT: 13 U/L (ref 0–44)
AST: 37 U/L (ref 15–41)
Albumin: 4.4 g/dL (ref 3.5–5.0)
Alkaline Phosphatase: 74 U/L (ref 38–126)
Anion gap: 13 (ref 5–15)
BUN: 17 mg/dL (ref 8–23)
CO2: 25 mmol/L (ref 22–32)
Calcium: 9.4 mg/dL (ref 8.9–10.3)
Chloride: 105 mmol/L (ref 98–111)
Creatinine: 0.97 mg/dL (ref 0.44–1.00)
GFR, Estimated: >60 mL/min
Glucose, Bld: 131 mg/dL — ABNORMAL HIGH (ref 70–99)
Potassium: 3.8 mmol/L (ref 3.5–5.1)
Sodium: 143 mmol/L (ref 135–145)
Total Bilirubin: 0.6 mg/dL (ref 0.0–1.2)
Total Protein: 7.4 g/dL (ref 6.5–8.1)

## 2024-04-18 LAB — IRON AND TIBC
Iron: 36 ug/dL (ref 28–170)
Saturation Ratios: 7 % — ABNORMAL LOW (ref 10.4–31.8)
TIBC: 517 ug/dL — ABNORMAL HIGH (ref 250–450)
UIBC: 481 ug/dL

## 2024-04-18 LAB — FERRITIN: Ferritin: 12 ng/mL (ref 11–307)

## 2024-04-18 LAB — VITAMIN B12: Vitamin B-12: 284 pg/mL (ref 180–914)

## 2024-04-18 LAB — FOLATE: Folate: 13 ng/mL

## 2024-04-18 NOTE — Telephone Encounter (Signed)
 Called pt and she said she is stable today. Nausea isn't as bad. Hematologist said they are going to schedule her for an IV iron  infusion next week. She said she did advise him of her GI sxs and he said he wants her to see GI doc asap so his office is going to reach out to GI doc to see if they can see her sooner. Pt said for now she is okay and is sticking with hematologist plan but if she needs us  she will let us  know

## 2024-04-22 ENCOUNTER — Telehealth: Payer: Self-pay | Admitting: Oncology

## 2024-04-22 ENCOUNTER — Encounter: Payer: Self-pay | Admitting: Oncology

## 2024-04-22 NOTE — Telephone Encounter (Signed)
 Scheduled appointments per 1/30 los. Talked with the patient and she is aware of the made appointments. Informed the patient that we will FU with her later today to get her scheduled her for IV iron  per Dr.Lewis. We are just waiting on that order to be placed. Patient expressed understanding.

## 2024-04-25 ENCOUNTER — Inpatient Hospital Stay

## 2024-04-25 VITALS — BP 122/68 | HR 73 | Temp 97.9°F | Resp 18 | Ht 63.0 in | Wt 144.0 lb

## 2024-04-25 DIAGNOSIS — D509 Iron deficiency anemia, unspecified: Secondary | ICD-10-CM

## 2024-04-25 MED ORDER — SODIUM CHLORIDE 0.9 % IV SOLN: INTRAVENOUS | Status: DC

## 2024-04-25 MED ORDER — SODIUM CHLORIDE 0.9 % IV SOLN
510.0000 mg | Freq: Once | INTRAVENOUS | Status: AC
  Administered 2024-04-25: 510 mg via INTRAVENOUS
  Filled 2024-04-25: qty 510

## 2024-04-25 MED ORDER — LORATADINE 10 MG PO TABS
10.0000 mg | ORAL_TABLET | Freq: Once | ORAL | Status: AC
  Administered 2024-04-25: 10 mg via ORAL
  Filled 2024-04-25: qty 1

## 2024-04-25 MED ORDER — ACETAMINOPHEN 325 MG PO TABS
650.0000 mg | ORAL_TABLET | Freq: Once | ORAL | Status: AC
  Administered 2024-04-25: 650 mg via ORAL
  Filled 2024-04-25: qty 2

## 2024-04-25 NOTE — Patient Instructions (Addendum)
 Ferumoxytol  Injection What is this medication? FERUMOXYTOL  (FER ue MOX i tol) treats low levels of iron  in your body (iron  deficiency anemia). Iron  is a mineral that plays an important role in making red blood cells, which carry oxygen from your lungs to the rest of your body. This medicine may be used for other purposes; ask your health care provider or pharmacist if you have questions. COMMON BRAND NAME(S): Feraheme  What should I tell my care team before I take this medication? They need to know if you have any of these conditions: Anemia not caused by low iron  levels High levels of iron  in the blood Magnetic resonance imaging (MRI) test scheduled An unusual or allergic reaction to iron , other medications, foods, dyes, or preservatives Pregnant or trying to get pregnant Breastfeeding How should I use this medication? This medication is injected into a vein. It is given by your care team in a hospital or clinic setting. Talk to your care team the use of this medication in children. Special care may be needed. Overdosage: If you think you have taken too much of this medicine contact a poison control center or emergency room at once. NOTE: This medicine is only for you. Do not share this medicine with others. What if I miss a dose? It is important not to miss your dose. Call your care team if you are unable to keep an appointment. What may interact with this medication? Other iron  products This list may not describe all possible interactions. Give your health care provider a list of all the medicines, herbs, non-prescription drugs, or dietary supplements you use. Also tell them if you smoke, drink alcohol , or use illegal drugs. Some items may interact with your medicine. What should I watch for while using this medication? Your condition will be monitored carefully while you are receiving this medication. Tell your care team if your symptoms do not start to get better or if they get  worse. You may need blood work done while you are taking this medication. Sometimes, when medications are infused into veins, a little can leak out of the vein and into the tissue around it. If this medication leaks, it can cause a brown or dark stain on the skin. This is not common. It may be permanent. If you feel pain or swelling during your infusion, tell your care team right away. They can stop the infusion and treat the area. You may need to eat more foods that contain iron . Talk to your care team. Foods that contain iron  include whole grains or cereals, dried fruits, beans, peas, leafy green vegetables, and organ meats (liver, kidney). What side effects may I notice from receiving this medication? Side effects that you should report to your care team as soon as possible: Allergic reactions--skin rash, itching, hives, swelling of the face, lips, tongue, or throat Low blood pressure--dizziness, feeling faint or lightheaded, blurry vision Painful swelling, warmth, or redness of the skin, brown or dark skin color at the infusion site Shortness of breath Side effects that usually do not require medical attention (report these to your care team if they continue or are bothersome): Flushing Headache Joint pain Muscle pain Nausea This list may not describe all possible side effects. Call your doctor for medical advice about side effects. You may report side effects to FDA at 1-800-FDA-1088. Where should I keep my medication? This medication is given in a hospital or clinic. It will not be stored at home. NOTE: This sheet is a summary.  It may not cover all possible information. If you have questions about this medicine, talk to your doctor, pharmacist, or health care provider.  2025 Elsevier/Gold Standard (2024-01-23 00:00:00)

## 2024-04-30 ENCOUNTER — Inpatient Hospital Stay

## 2024-05-09 ENCOUNTER — Ambulatory Visit: Payer: Self-pay

## 2024-07-16 ENCOUNTER — Inpatient Hospital Stay: Admitting: Oncology

## 2024-07-16 ENCOUNTER — Inpatient Hospital Stay

## 2024-08-15 ENCOUNTER — Ambulatory Visit: Admitting: Oncology

## 2024-08-15 ENCOUNTER — Other Ambulatory Visit

## 2024-08-29 ENCOUNTER — Ambulatory Visit
